# Patient Record
Sex: Male | Born: 1940
Health system: Southern US, Community
[De-identification: ages and names within clinical notes are randomized; demographics above are authoritative.]

## PROBLEM LIST (undated history)

## (undated) DIAGNOSIS — F32A Depression, unspecified: Secondary | ICD-10-CM

## (undated) DIAGNOSIS — M792 Neuralgia and neuritis, unspecified: Secondary | ICD-10-CM

## (undated) DIAGNOSIS — F329 Major depressive disorder, single episode, unspecified: Secondary | ICD-10-CM

## (undated) DIAGNOSIS — I2510 Atherosclerotic heart disease of native coronary artery without angina pectoris: Secondary | ICD-10-CM

## (undated) DIAGNOSIS — E7800 Pure hypercholesterolemia, unspecified: Secondary | ICD-10-CM

## (undated) DIAGNOSIS — D45 Polycythemia vera: Principal | ICD-10-CM

## (undated) DIAGNOSIS — B029 Zoster without complications: Secondary | ICD-10-CM

## (undated) DIAGNOSIS — I10 Essential (primary) hypertension: Secondary | ICD-10-CM

## (undated) HISTORY — DX: Polycythemia vera: D45

## (undated) HISTORY — DX: Zoster without complications: B02.9

## (undated) HISTORY — PX: CARDIAC CATHETERIZATION: SHX172

## (undated) HISTORY — DX: Neuralgia and neuritis, unspecified: M79.2

---

## 2002-02-09 ENCOUNTER — Ambulatory Visit (HOSPITAL_COMMUNITY): Admission: RE | Admit: 2002-02-09 | Discharge: 2002-02-09 | Payer: Self-pay | Admitting: Gastroenterology

## 2004-09-06 ENCOUNTER — Ambulatory Visit: Payer: Self-pay | Admitting: Hematology & Oncology

## 2004-10-31 ENCOUNTER — Ambulatory Visit: Payer: Self-pay | Admitting: Hematology & Oncology

## 2004-12-26 ENCOUNTER — Ambulatory Visit: Payer: Self-pay | Admitting: Hematology & Oncology

## 2005-02-20 ENCOUNTER — Ambulatory Visit: Payer: Self-pay | Admitting: Hematology & Oncology

## 2005-04-17 ENCOUNTER — Ambulatory Visit: Payer: Self-pay | Admitting: Hematology & Oncology

## 2005-08-07 ENCOUNTER — Ambulatory Visit: Payer: Self-pay | Admitting: Hematology & Oncology

## 2005-10-17 ENCOUNTER — Ambulatory Visit: Payer: Self-pay | Admitting: Hematology & Oncology

## 2006-02-04 ENCOUNTER — Ambulatory Visit: Payer: Self-pay | Admitting: Hematology & Oncology

## 2006-02-06 LAB — CBC WITH DIFFERENTIAL/PLATELET
BASO%: 0.3 % (ref 0.0–2.0)
Basophils Absolute: 0 10*3/uL (ref 0.0–0.1)
EOS%: 0.8 % (ref 0.0–7.0)
Eosinophils Absolute: 0.1 10*3/uL (ref 0.0–0.5)
HCT: 49.8 % (ref 38.7–49.9)
HGB: 17.3 g/dL — ABNORMAL HIGH (ref 13.0–17.1)
LYMPH%: 26.6 % (ref 14.0–48.0)
MCH: 31.6 pg (ref 28.0–33.4)
MCHC: 34.8 g/dL (ref 32.0–35.9)
MCV: 90.8 fL (ref 81.6–98.0)
MONO#: 1.1 10*3/uL — ABNORMAL HIGH (ref 0.1–0.9)
MONO%: 13.2 % — ABNORMAL HIGH (ref 0.0–13.0)
NEUT#: 4.7 10*3/uL (ref 1.5–6.5)
NEUT%: 59.1 % (ref 40.0–75.0)
Platelets: 245 10*3/uL (ref 145–400)
RBC: 5.48 10*6/uL (ref 4.20–5.71)
RDW: 14 % (ref 11.2–14.6)
WBC: 8 10*3/uL (ref 4.0–10.0)
lymph#: 2.1 10*3/uL (ref 0.9–3.3)

## 2006-04-07 ENCOUNTER — Ambulatory Visit: Payer: Self-pay | Admitting: Hematology & Oncology

## 2006-04-10 LAB — CBC WITH DIFFERENTIAL/PLATELET
BASO%: 0.4 % (ref 0.0–2.0)
Basophils Absolute: 0 10*3/uL (ref 0.0–0.1)
EOS%: 1.2 % (ref 0.0–7.0)
Eosinophils Absolute: 0.1 10*3/uL (ref 0.0–0.5)
HCT: 49.9 % (ref 38.7–49.9)
HGB: 17.5 g/dL — ABNORMAL HIGH (ref 13.0–17.1)
LYMPH%: 30.6 % (ref 14.0–48.0)
MCH: 32.3 pg (ref 28.0–33.4)
MCHC: 35.1 g/dL (ref 32.0–35.9)
MCV: 91.8 fL (ref 81.6–98.0)
MONO#: 0.7 10*3/uL (ref 0.1–0.9)
MONO%: 8.8 % (ref 0.0–13.0)
NEUT#: 4.8 10*3/uL (ref 1.5–6.5)
NEUT%: 59 % (ref 40.0–75.0)
Platelets: 239 10*3/uL (ref 145–400)
RBC: 5.43 10*6/uL (ref 4.20–5.71)
RDW: 13.8 % (ref 11.2–14.6)
WBC: 8.2 10*3/uL (ref 4.0–10.0)
lymph#: 2.5 10*3/uL (ref 0.9–3.3)

## 2006-04-14 LAB — CBC WITH DIFFERENTIAL/PLATELET
BASO%: 1.6 % (ref 0.0–2.0)
Basophils Absolute: 0.1 10*3/uL (ref 0.0–0.1)
EOS%: 1.1 % (ref 0.0–7.0)
Eosinophils Absolute: 0.1 10*3/uL (ref 0.0–0.5)
HCT: 53.6 % — ABNORMAL HIGH (ref 38.7–49.9)
HGB: 18.5 g/dL — ABNORMAL HIGH (ref 13.0–17.1)
LYMPH%: 22.8 % (ref 14.0–48.0)
MCH: 31.9 pg (ref 28.0–33.4)
MCHC: 34.6 g/dL (ref 32.0–35.9)
MCV: 92.1 fL (ref 81.6–98.0)
MONO#: 0.9 10*3/uL (ref 0.1–0.9)
MONO%: 11 % (ref 0.0–13.0)
NEUT#: 5.2 10*3/uL (ref 1.5–6.5)
NEUT%: 63.5 % (ref 40.0–75.0)
Platelets: 235 10*3/uL (ref 145–400)
RBC: 5.82 10*6/uL — ABNORMAL HIGH (ref 4.20–5.71)
RDW: 13.6 % (ref 11.2–14.6)
WBC: 8.2 10*3/uL (ref 4.0–10.0)
lymph#: 1.9 10*3/uL (ref 0.9–3.3)

## 2006-04-21 LAB — CBC WITH DIFFERENTIAL/PLATELET
BASO%: 0.5 % (ref 0.0–2.0)
Basophils Absolute: 0 10*3/uL (ref 0.0–0.1)
EOS%: 0.8 % (ref 0.0–7.0)
Eosinophils Absolute: 0.1 10*3/uL (ref 0.0–0.5)
HCT: 51.5 % — ABNORMAL HIGH (ref 38.7–49.9)
HGB: 17.9 g/dL — ABNORMAL HIGH (ref 13.0–17.1)
LYMPH%: 20.5 % (ref 14.0–48.0)
MCH: 32.1 pg (ref 28.0–33.4)
MCHC: 34.7 g/dL (ref 32.0–35.9)
MCV: 92.7 fL (ref 81.6–98.0)
MONO#: 1.1 10*3/uL — ABNORMAL HIGH (ref 0.1–0.9)
MONO%: 11.2 % (ref 0.0–13.0)
NEUT#: 6.5 10*3/uL (ref 1.5–6.5)
NEUT%: 67 % (ref 40.0–75.0)
Platelets: 261 10*3/uL (ref 145–400)
RBC: 5.56 10*6/uL (ref 4.20–5.71)
RDW: 13.4 % (ref 11.2–14.6)
WBC: 9.7 10*3/uL (ref 4.0–10.0)
lymph#: 2 10*3/uL (ref 0.9–3.3)

## 2006-04-28 LAB — CBC WITH DIFFERENTIAL/PLATELET
BASO%: 0.5 % (ref 0.0–2.0)
Basophils Absolute: 0 10*3/uL (ref 0.0–0.1)
EOS%: 0.8 % (ref 0.0–7.0)
Eosinophils Absolute: 0.1 10*3/uL (ref 0.0–0.5)
HCT: 47.5 % (ref 38.7–49.9)
HGB: 16.7 g/dL (ref 13.0–17.1)
LYMPH%: 25.1 % (ref 14.0–48.0)
MCH: 32.4 pg (ref 28.0–33.4)
MCHC: 35.2 g/dL (ref 32.0–35.9)
MCV: 91.9 fL (ref 81.6–98.0)
MONO#: 0.9 10*3/uL (ref 0.1–0.9)
MONO%: 10.9 % (ref 0.0–13.0)
NEUT#: 5 10*3/uL (ref 1.5–6.5)
NEUT%: 62.7 % (ref 40.0–75.0)
Platelets: 270 10*3/uL (ref 145–400)
RBC: 5.17 10*6/uL (ref 4.20–5.71)
RDW: 13.5 % (ref 11.2–14.6)
WBC: 7.9 10*3/uL (ref 4.0–10.0)
lymph#: 2 10*3/uL (ref 0.9–3.3)

## 2006-05-08 LAB — CBC WITH DIFFERENTIAL/PLATELET
BASO%: 0.7 % (ref 0.0–2.0)
Basophils Absolute: 0.1 10*3/uL (ref 0.0–0.1)
EOS%: 0.9 % (ref 0.0–7.0)
Eosinophils Absolute: 0.1 10*3/uL (ref 0.0–0.5)
HCT: 43.1 % (ref 38.7–49.9)
HGB: 14.8 g/dL (ref 13.0–17.1)
LYMPH%: 33.3 % (ref 14.0–48.0)
MCH: 31.3 pg (ref 28.0–33.4)
MCHC: 34.3 g/dL (ref 32.0–35.9)
MCV: 91.1 fL (ref 81.6–98.0)
MONO#: 0.9 10*3/uL (ref 0.1–0.9)
MONO%: 12.6 % (ref 0.0–13.0)
NEUT#: 3.9 10*3/uL (ref 1.5–6.5)
NEUT%: 52.5 % (ref 40.0–75.0)
Platelets: 297 10*3/uL (ref 145–400)
RBC: 4.74 10*6/uL (ref 4.20–5.71)
RDW: 13.1 % (ref 11.2–14.6)
WBC: 7.4 10*3/uL (ref 4.0–10.0)
lymph#: 2.5 10*3/uL (ref 0.9–3.3)

## 2006-05-27 ENCOUNTER — Ambulatory Visit: Payer: Self-pay | Admitting: Hematology & Oncology

## 2006-05-29 LAB — FERRITIN: Ferritin: 13 ng/mL — ABNORMAL LOW (ref 22–322)

## 2006-05-29 LAB — CBC WITH DIFFERENTIAL/PLATELET
BASO%: 0.3 % (ref 0.0–2.0)
Basophils Absolute: 0 10*3/uL (ref 0.0–0.1)
EOS%: 0.8 % (ref 0.0–7.0)
Eosinophils Absolute: 0.1 10*3/uL (ref 0.0–0.5)
HCT: 45.4 % (ref 38.7–49.9)
HGB: 15.5 g/dL (ref 13.0–17.1)
LYMPH%: 32.2 % (ref 14.0–48.0)
MCH: 30.2 pg (ref 28.0–33.4)
MCHC: 34.1 g/dL (ref 32.0–35.9)
MCV: 88.4 fL (ref 81.6–98.0)
MONO#: 1 10*3/uL — ABNORMAL HIGH (ref 0.1–0.9)
MONO%: 12.8 % (ref 0.0–13.0)
NEUT#: 4.2 10*3/uL (ref 1.5–6.5)
NEUT%: 53.9 % (ref 40.0–75.0)
Platelets: 329 10*3/uL (ref 145–400)
RBC: 5.14 10*6/uL (ref 4.20–5.71)
RDW: 13.9 % (ref 11.2–14.6)
WBC: 7.9 10*3/uL (ref 4.0–10.0)
lymph#: 2.5 10*3/uL (ref 0.9–3.3)

## 2006-06-10 LAB — CBC WITH DIFFERENTIAL/PLATELET
BASO%: 0.9 % (ref 0.0–2.0)
Basophils Absolute: 0.1 10*3/uL (ref 0.0–0.1)
EOS%: 0.9 % (ref 0.0–7.0)
Eosinophils Absolute: 0.1 10*3/uL (ref 0.0–0.5)
HCT: 45.9 % (ref 38.7–49.9)
HGB: 15.6 g/dL (ref 13.0–17.1)
LYMPH%: 29.3 % (ref 14.0–48.0)
MCH: 30.1 pg (ref 28.0–33.4)
MCHC: 34.1 g/dL (ref 32.0–35.9)
MCV: 88.4 fL (ref 81.6–98.0)
MONO#: 0.8 10*3/uL (ref 0.1–0.9)
MONO%: 10.5 % (ref 0.0–13.0)
NEUT#: 4.6 10*3/uL (ref 1.5–6.5)
NEUT%: 58.4 % (ref 40.0–75.0)
Platelets: 290 10*3/uL (ref 145–400)
RBC: 5.19 10*6/uL (ref 4.20–5.71)
RDW: 13.9 % (ref 11.2–14.6)
WBC: 7.9 10*3/uL (ref 4.0–10.0)
lymph#: 2.3 10*3/uL (ref 0.9–3.3)

## 2006-09-02 ENCOUNTER — Ambulatory Visit: Payer: Self-pay | Admitting: Hematology & Oncology

## 2006-11-03 ENCOUNTER — Ambulatory Visit: Payer: Self-pay | Admitting: Hematology & Oncology

## 2006-11-06 LAB — CBC WITH DIFFERENTIAL/PLATELET
BASO%: 0.5 % (ref 0.0–2.0)
Basophils Absolute: 0 10*3/uL (ref 0.0–0.1)
EOS%: 0.6 % (ref 0.0–7.0)
Eosinophils Absolute: 0 10*3/uL (ref 0.0–0.5)
HCT: 46.5 % (ref 38.7–49.9)
HGB: 16.5 g/dL (ref 13.0–17.1)
LYMPH%: 28.9 % (ref 14.0–48.0)
MCH: 29.2 pg (ref 28.0–33.4)
MCHC: 35.4 g/dL (ref 32.0–35.9)
MCV: 82.5 fL (ref 81.6–98.0)
MONO#: 1 10*3/uL — ABNORMAL HIGH (ref 0.1–0.9)
MONO%: 13.5 % — ABNORMAL HIGH (ref 0.0–13.0)
NEUT#: 4 10*3/uL (ref 1.5–6.5)
NEUT%: 56.5 % (ref 40.0–75.0)
Platelets: 269 10*3/uL (ref 145–400)
RBC: 5.63 10*6/uL (ref 4.20–5.71)
RDW: 17.8 % — ABNORMAL HIGH (ref 11.2–14.6)
WBC: 7.1 10*3/uL (ref 4.0–10.0)
lymph#: 2.1 10*3/uL (ref 0.9–3.3)

## 2006-12-04 LAB — CBC WITH DIFFERENTIAL/PLATELET
BASO%: 1.2 % (ref 0.0–2.0)
Basophils Absolute: 0.1 10*3/uL (ref 0.0–0.1)
EOS%: 1 % (ref 0.0–7.0)
Eosinophils Absolute: 0.1 10*3/uL (ref 0.0–0.5)
HCT: 49.9 % (ref 38.7–49.9)
HGB: 16.6 g/dL (ref 13.0–17.1)
LYMPH%: 33.1 % (ref 14.0–48.0)
MCH: 28.5 pg (ref 28.0–33.4)
MCHC: 33.2 g/dL (ref 32.0–35.9)
MCV: 85.8 fL (ref 81.6–98.0)
MONO#: 1.4 10*3/uL — ABNORMAL HIGH (ref 0.1–0.9)
MONO%: 16.7 % — ABNORMAL HIGH (ref 0.0–13.0)
NEUT#: 4 10*3/uL (ref 1.5–6.5)
NEUT%: 48 % (ref 40.0–75.0)
Platelets: 294 10*3/uL (ref 145–400)
RBC: 5.82 10*6/uL — ABNORMAL HIGH (ref 4.20–5.71)
RDW: 13.9 % (ref 11.2–14.6)
WBC: 8.3 10*3/uL (ref 4.0–10.0)
lymph#: 2.8 10*3/uL (ref 0.9–3.3)

## 2006-12-11 LAB — CBC WITH DIFFERENTIAL/PLATELET
BASO%: 0.6 % (ref 0.0–2.0)
Basophils Absolute: 0.1 10*3/uL (ref 0.0–0.1)
EOS%: 0.6 % (ref 0.0–7.0)
Eosinophils Absolute: 0.1 10*3/uL (ref 0.0–0.5)
HCT: 45.7 % (ref 38.7–49.9)
HGB: 15.3 g/dL (ref 13.0–17.1)
LYMPH%: 27.7 % (ref 14.0–48.0)
MCH: 28.4 pg (ref 28.0–33.4)
MCHC: 33.5 g/dL (ref 32.0–35.9)
MCV: 84.8 fL (ref 81.6–98.0)
MONO#: 1.4 10*3/uL — ABNORMAL HIGH (ref 0.1–0.9)
MONO%: 14.3 % — ABNORMAL HIGH (ref 0.0–13.0)
NEUT#: 5.5 10*3/uL (ref 1.5–6.5)
NEUT%: 56.8 % (ref 40.0–75.0)
Platelets: 317 10*3/uL (ref 145–400)
RBC: 5.39 10*6/uL (ref 4.20–5.71)
RDW: 13.4 % (ref 11.2–14.6)
WBC: 9.6 10*3/uL (ref 4.0–10.0)
lymph#: 2.7 10*3/uL (ref 0.9–3.3)

## 2006-12-18 ENCOUNTER — Ambulatory Visit: Payer: Self-pay | Admitting: Hematology & Oncology

## 2006-12-18 LAB — CBC WITH DIFFERENTIAL/PLATELET
BASO%: 0.8 % (ref 0.0–2.0)
Basophils Absolute: 0.1 10*3/uL (ref 0.0–0.1)
EOS%: 0.5 % (ref 0.0–7.0)
Eosinophils Absolute: 0 10*3/uL (ref 0.0–0.5)
HCT: 42.3 % (ref 38.7–49.9)
HGB: 14.1 g/dL (ref 13.0–17.1)
LYMPH%: 25 % (ref 14.0–48.0)
MCH: 28.2 pg (ref 28.0–33.4)
MCHC: 33.3 g/dL (ref 32.0–35.9)
MCV: 84.7 fL (ref 81.6–98.0)
MONO#: 1.1 10*3/uL — ABNORMAL HIGH (ref 0.1–0.9)
MONO%: 13.4 % — ABNORMAL HIGH (ref 0.0–13.0)
NEUT#: 4.8 10*3/uL (ref 1.5–6.5)
NEUT%: 60.2 % (ref 40.0–75.0)
Platelets: 376 10*3/uL (ref 145–400)
RBC: 4.99 10*6/uL (ref 4.20–5.71)
RDW: 12.9 % (ref 11.2–14.6)
WBC: 7.9 10*3/uL (ref 4.0–10.0)
lymph#: 2 10*3/uL (ref 0.9–3.3)

## 2007-03-03 ENCOUNTER — Ambulatory Visit: Payer: Self-pay | Admitting: Hematology & Oncology

## 2007-03-05 LAB — CBC WITH DIFFERENTIAL/PLATELET
BASO%: 0.8 % (ref 0.0–2.0)
Basophils Absolute: 0.1 10*3/uL (ref 0.0–0.1)
EOS%: 1 % (ref 0.0–7.0)
Eosinophils Absolute: 0.1 10*3/uL (ref 0.0–0.5)
HCT: 46 % (ref 38.7–49.9)
HGB: 14.7 g/dL (ref 13.0–17.1)
LYMPH%: 32.7 % (ref 14.0–48.0)
MCH: 25.7 pg — ABNORMAL LOW (ref 28.0–33.4)
MCHC: 32 g/dL (ref 32.0–35.9)
MCV: 80.3 fL — ABNORMAL LOW (ref 81.6–98.0)
MONO#: 1.2 10*3/uL — ABNORMAL HIGH (ref 0.1–0.9)
MONO%: 14.1 % — ABNORMAL HIGH (ref 0.0–13.0)
NEUT#: 4.5 10*3/uL (ref 1.5–6.5)
NEUT%: 51.4 % (ref 40.0–75.0)
Platelets: 261 10*3/uL (ref 145–400)
RBC: 5.72 10*6/uL — ABNORMAL HIGH (ref 4.20–5.71)
RDW: 14.1 % (ref 11.2–14.6)
WBC: 8.7 10*3/uL (ref 4.0–10.0)
lymph#: 2.8 10*3/uL (ref 0.9–3.3)

## 2007-03-11 LAB — CBC WITH DIFFERENTIAL/PLATELET
BASO%: 0.9 % (ref 0.0–2.0)
Basophils Absolute: 0.1 10*3/uL (ref 0.0–0.1)
EOS%: 1 % (ref 0.0–7.0)
Eosinophils Absolute: 0.1 10*3/uL (ref 0.0–0.5)
HCT: 47.2 % (ref 38.7–49.9)
HGB: 15.1 g/dL (ref 13.0–17.1)
LYMPH%: 32.7 % (ref 14.0–48.0)
MCH: 25.6 pg — ABNORMAL LOW (ref 28.0–33.4)
MCHC: 31.9 g/dL — ABNORMAL LOW (ref 32.0–35.9)
MCV: 80.2 fL — ABNORMAL LOW (ref 81.6–98.0)
MONO#: 0.9 10*3/uL (ref 0.1–0.9)
MONO%: 12.1 % (ref 0.0–13.0)
NEUT#: 4.1 10*3/uL (ref 1.5–6.5)
NEUT%: 53.4 % (ref 40.0–75.0)
Platelets: 300 10*3/uL (ref 145–400)
RBC: 5.89 10*6/uL — ABNORMAL HIGH (ref 4.20–5.71)
RDW: 14.4 % (ref 11.2–14.6)
WBC: 7.8 10*3/uL (ref 4.0–10.0)
lymph#: 2.5 10*3/uL (ref 0.9–3.3)

## 2007-03-18 LAB — CBC WITH DIFFERENTIAL/PLATELET
BASO%: 0.7 % (ref 0.0–2.0)
Basophils Absolute: 0.1 10*3/uL (ref 0.0–0.1)
EOS%: 1.1 % (ref 0.0–7.0)
Eosinophils Absolute: 0.1 10*3/uL (ref 0.0–0.5)
HCT: 42.6 % (ref 38.7–49.9)
HGB: 14.2 g/dL (ref 13.0–17.1)
LYMPH%: 32.9 % (ref 14.0–48.0)
MCH: 26.3 pg — ABNORMAL LOW (ref 28.0–33.4)
MCHC: 33.3 g/dL (ref 32.0–35.9)
MCV: 79.1 fL — ABNORMAL LOW (ref 81.6–98.0)
MONO#: 1.2 10*3/uL — ABNORMAL HIGH (ref 0.1–0.9)
MONO%: 14.9 % — ABNORMAL HIGH (ref 0.0–13.0)
NEUT#: 4.1 10*3/uL (ref 1.5–6.5)
NEUT%: 50.3 % (ref 40.0–75.0)
Platelets: 308 10*3/uL (ref 145–400)
RBC: 5.38 10*6/uL (ref 4.20–5.71)
RDW: 14.5 % (ref 11.2–14.6)
WBC: 8.2 10*3/uL (ref 4.0–10.0)
lymph#: 2.7 10*3/uL (ref 0.9–3.3)

## 2007-03-25 LAB — CBC WITH DIFFERENTIAL/PLATELET
BASO%: 0.8 % (ref 0.0–2.0)
Basophils Absolute: 0.1 10*3/uL (ref 0.0–0.1)
EOS%: 1.1 % (ref 0.0–7.0)
Eosinophils Absolute: 0.1 10*3/uL (ref 0.0–0.5)
HCT: 42.5 % (ref 38.7–49.9)
HGB: 13.8 g/dL (ref 13.0–17.1)
LYMPH%: 35.6 % (ref 14.0–48.0)
MCH: 25.7 pg — ABNORMAL LOW (ref 28.0–33.4)
MCHC: 32.6 g/dL (ref 32.0–35.9)
MCV: 79 fL — ABNORMAL LOW (ref 81.6–98.0)
MONO#: 1 10*3/uL — ABNORMAL HIGH (ref 0.1–0.9)
MONO%: 13.8 % — ABNORMAL HIGH (ref 0.0–13.0)
NEUT#: 3.6 10*3/uL (ref 1.5–6.5)
NEUT%: 48.7 % (ref 40.0–75.0)
Platelets: 306 10*3/uL (ref 145–400)
RBC: 5.37 10*6/uL (ref 4.20–5.71)
RDW: 14.7 % — ABNORMAL HIGH (ref 11.2–14.6)
WBC: 7.5 10*3/uL (ref 4.0–10.0)
lymph#: 2.7 10*3/uL (ref 0.9–3.3)

## 2007-04-02 LAB — CBC WITH DIFFERENTIAL/PLATELET
BASO%: 0.3 % (ref 0.0–2.0)
Basophils Absolute: 0 10*3/uL (ref 0.0–0.1)
EOS%: 0.9 % (ref 0.0–7.0)
Eosinophils Absolute: 0.1 10*3/uL (ref 0.0–0.5)
HCT: 40.7 % (ref 38.7–49.9)
HGB: 13.6 g/dL (ref 13.0–17.1)
LYMPH%: 29.9 % (ref 14.0–48.0)
MCH: 26.2 pg — ABNORMAL LOW (ref 28.0–33.4)
MCHC: 33.5 g/dL (ref 32.0–35.9)
MCV: 78.3 fL — ABNORMAL LOW (ref 81.6–98.0)
MONO#: 0.7 10*3/uL (ref 0.1–0.9)
MONO%: 10.4 % (ref 0.0–13.0)
NEUT#: 4.1 10*3/uL (ref 1.5–6.5)
NEUT%: 58.5 % (ref 40.0–75.0)
Platelets: 283 10*3/uL (ref 145–400)
RBC: 5.2 10*6/uL (ref 4.20–5.71)
RDW: 17.2 % — ABNORMAL HIGH (ref 11.2–14.6)
WBC: 7 10*3/uL (ref 4.0–10.0)
lymph#: 2.1 10*3/uL (ref 0.9–3.3)

## 2007-04-02 LAB — FERRITIN: Ferritin: 11 ng/mL — ABNORMAL LOW (ref 22–322)

## 2007-05-05 ENCOUNTER — Ambulatory Visit: Payer: Self-pay | Admitting: Hematology & Oncology

## 2007-05-07 LAB — CBC WITH DIFFERENTIAL/PLATELET
BASO%: 1.2 % (ref 0.0–2.0)
Basophils Absolute: 0.1 10*3/uL (ref 0.0–0.1)
EOS%: 1.1 % (ref 0.0–7.0)
Eosinophils Absolute: 0.1 10*3/uL (ref 0.0–0.5)
HCT: 46 % (ref 38.7–49.9)
HGB: 14.5 g/dL (ref 13.0–17.1)
LYMPH%: 34.2 % (ref 14.0–48.0)
MCH: 25.3 pg — ABNORMAL LOW (ref 28.0–33.4)
MCHC: 31.6 g/dL — ABNORMAL LOW (ref 32.0–35.9)
MCV: 80.1 fL — ABNORMAL LOW (ref 81.6–98.0)
MONO#: 1.1 10*3/uL — ABNORMAL HIGH (ref 0.1–0.9)
MONO%: 14.2 % — ABNORMAL HIGH (ref 0.0–13.0)
NEUT#: 3.9 10*3/uL (ref 1.5–6.5)
NEUT%: 49.3 % (ref 40.0–75.0)
Platelets: 303 10*3/uL (ref 145–400)
RBC: 5.74 10*6/uL — ABNORMAL HIGH (ref 4.20–5.71)
RDW: 14.6 % (ref 11.2–14.6)
WBC: 7.8 10*3/uL (ref 4.0–10.0)
lymph#: 2.7 10*3/uL (ref 0.9–3.3)

## 2007-06-30 ENCOUNTER — Ambulatory Visit: Payer: Self-pay | Admitting: Hematology & Oncology

## 2007-07-02 LAB — CBC WITH DIFFERENTIAL/PLATELET
BASO%: 0.4 % (ref 0.0–2.0)
Basophils Absolute: 0 10*3/uL (ref 0.0–0.1)
EOS%: 0.7 % (ref 0.0–7.0)
Eosinophils Absolute: 0.1 10*3/uL (ref 0.0–0.5)
HCT: 44.5 % (ref 38.7–49.9)
HGB: 14.8 g/dL (ref 13.0–17.1)
LYMPH%: 27 % (ref 14.0–48.0)
MCH: 26.7 pg — ABNORMAL LOW (ref 28.0–33.4)
MCHC: 33.3 g/dL (ref 32.0–35.9)
MCV: 80.2 fL — ABNORMAL LOW (ref 81.6–98.0)
MONO#: 1.1 10*3/uL — ABNORMAL HIGH (ref 0.1–0.9)
MONO%: 13.1 % — ABNORMAL HIGH (ref 0.0–13.0)
NEUT#: 4.9 10*3/uL (ref 1.5–6.5)
NEUT%: 58.8 % (ref 40.0–75.0)
Platelets: 271 10*3/uL (ref 145–400)
RBC: 5.55 10*6/uL (ref 4.20–5.71)
RDW: 17.7 % — ABNORMAL HIGH (ref 11.2–14.6)
WBC: 8.4 10*3/uL (ref 4.0–10.0)
lymph#: 2.3 10*3/uL (ref 0.9–3.3)

## 2007-09-22 ENCOUNTER — Ambulatory Visit: Payer: Self-pay | Admitting: Hematology & Oncology

## 2007-09-25 LAB — IRON AND TIBC
%SAT: 22 % (ref 20–55)
Iron: 86 ug/dL (ref 42–165)
TIBC: 394 ug/dL (ref 215–435)
UIBC: 308 ug/dL

## 2007-09-25 LAB — CBC WITH DIFFERENTIAL/PLATELET
BASO%: 1.1 % (ref 0.0–2.0)
Basophils Absolute: 0.1 10*3/uL (ref 0.0–0.1)
EOS%: 1.2 % (ref 0.0–7.0)
Eosinophils Absolute: 0.1 10*3/uL (ref 0.0–0.5)
HCT: 48.7 % (ref 38.7–49.9)
HGB: 16.5 g/dL (ref 13.0–17.1)
LYMPH%: 31.5 % (ref 14.0–48.0)
MCH: 28.8 pg (ref 28.0–33.4)
MCHC: 34 g/dL (ref 32.0–35.9)
MCV: 84.8 fL (ref 81.6–98.0)
MONO#: 0.9 10*3/uL (ref 0.1–0.9)
MONO%: 13.3 % — ABNORMAL HIGH (ref 0.0–13.0)
NEUT#: 3.7 10*3/uL (ref 1.5–6.5)
NEUT%: 52.9 % (ref 40.0–75.0)
Platelets: 246 10*3/uL (ref 145–400)
RBC: 5.74 10*6/uL — ABNORMAL HIGH (ref 4.20–5.71)
RDW: 16.6 % — ABNORMAL HIGH (ref 11.2–14.6)
WBC: 7 10*3/uL (ref 4.0–10.0)
lymph#: 2.2 10*3/uL (ref 0.9–3.3)

## 2007-09-25 LAB — FERRITIN: Ferritin: 17 ng/mL — ABNORMAL LOW (ref 22–322)

## 2007-09-25 LAB — COMPREHENSIVE METABOLIC PANEL
ALT: 32 U/L (ref 0–53)
AST: 23 U/L (ref 0–37)
Albumin: 4.2 g/dL (ref 3.5–5.2)
Alkaline Phosphatase: 52 U/L (ref 39–117)
BUN: 15 mg/dL (ref 6–23)
CO2: 26 mEq/L (ref 19–32)
Calcium: 10.3 mg/dL (ref 8.4–10.5)
Chloride: 102 mEq/L (ref 96–112)
Creatinine, Ser: 1.02 mg/dL (ref 0.40–1.50)
Glucose, Bld: 107 mg/dL — ABNORMAL HIGH (ref 70–99)
Potassium: 3.5 mEq/L (ref 3.5–5.3)
Sodium: 138 mEq/L (ref 135–145)
Total Bilirubin: 1.4 mg/dL — ABNORMAL HIGH (ref 0.3–1.2)
Total Protein: 7.3 g/dL (ref 6.0–8.3)

## 2007-09-25 LAB — LACTATE DEHYDROGENASE: LDH: 115 U/L (ref 94–250)

## 2007-10-02 LAB — CBC WITH DIFFERENTIAL/PLATELET
BASO%: 1 % (ref 0.0–2.0)
Basophils Absolute: 0.1 10*3/uL (ref 0.0–0.1)
EOS%: 1.2 % (ref 0.0–7.0)
Eosinophils Absolute: 0.1 10*3/uL (ref 0.0–0.5)
HCT: 47.8 % (ref 38.7–49.9)
HGB: 15.9 g/dL (ref 13.0–17.1)
LYMPH%: 34 % (ref 14.0–48.0)
MCH: 28.9 pg (ref 28.0–33.4)
MCHC: 33.2 g/dL (ref 32.0–35.9)
MCV: 86.8 fL (ref 81.6–98.0)
MONO#: 1 10*3/uL — ABNORMAL HIGH (ref 0.1–0.9)
MONO%: 14.2 % — ABNORMAL HIGH (ref 0.0–13.0)
NEUT#: 3.7 10*3/uL (ref 1.5–6.5)
NEUT%: 49.6 % (ref 40.0–75.0)
Platelets: 290 10*3/uL (ref 145–400)
RBC: 5.5 10*6/uL (ref 4.20–5.71)
RDW: 13.6 % (ref 11.2–14.6)
WBC: 7.4 10*3/uL (ref 4.0–10.0)
lymph#: 2.5 10*3/uL (ref 0.9–3.3)

## 2007-11-10 ENCOUNTER — Ambulatory Visit: Payer: Self-pay | Admitting: Hematology & Oncology

## 2007-11-16 LAB — CBC WITH DIFFERENTIAL/PLATELET
BASO%: 1.1 % (ref 0.0–2.0)
Basophils Absolute: 0.1 10*3/uL (ref 0.0–0.1)
EOS%: 0.5 % (ref 0.0–7.0)
Eosinophils Absolute: 0 10*3/uL (ref 0.0–0.5)
HCT: 46.1 % (ref 38.7–49.9)
HGB: 15.5 g/dL (ref 13.0–17.1)
LYMPH%: 26.7 % (ref 14.0–48.0)
MCH: 29.4 pg (ref 28.0–33.4)
MCHC: 33.6 g/dL (ref 32.0–35.9)
MCV: 87.5 fL (ref 81.6–98.0)
MONO#: 1.1 10*3/uL — ABNORMAL HIGH (ref 0.1–0.9)
MONO%: 13.6 % — ABNORMAL HIGH (ref 0.0–13.0)
NEUT#: 4.7 10*3/uL (ref 1.5–6.5)
NEUT%: 58.1 % (ref 40.0–75.0)
Platelets: 348 10*3/uL (ref 145–400)
RBC: 5.27 10*6/uL (ref 4.20–5.71)
RDW: 14.2 % (ref 11.2–14.6)
WBC: 8.2 10*3/uL (ref 4.0–10.0)
lymph#: 2.2 10*3/uL (ref 0.9–3.3)

## 2007-12-23 ENCOUNTER — Ambulatory Visit: Payer: Self-pay | Admitting: Hematology & Oncology

## 2007-12-25 LAB — CBC WITH DIFFERENTIAL/PLATELET
BASO%: 0.3 % (ref 0.0–2.0)
Basophils Absolute: 0 10*3/uL (ref 0.0–0.1)
EOS%: 0.6 % (ref 0.0–7.0)
Eosinophils Absolute: 0 10*3/uL (ref 0.0–0.5)
HCT: 37.5 % — ABNORMAL LOW (ref 38.7–49.9)
HGB: 12.6 g/dL — ABNORMAL LOW (ref 13.0–17.1)
LYMPH%: 26.5 % (ref 14.0–48.0)
MCH: 26.7 pg — ABNORMAL LOW (ref 28.0–33.4)
MCHC: 33.5 g/dL (ref 32.0–35.9)
MCV: 79.7 fL — ABNORMAL LOW (ref 81.6–98.0)
MONO#: 1 10*3/uL — ABNORMAL HIGH (ref 0.1–0.9)
MONO%: 12.3 % (ref 0.0–13.0)
NEUT#: 4.8 10*3/uL (ref 1.5–6.5)
NEUT%: 60.3 % (ref 40.0–75.0)
Platelets: 353 10*3/uL (ref 145–400)
RBC: 4.7 10*6/uL (ref 4.20–5.71)
RDW: 15.2 % — ABNORMAL HIGH (ref 11.2–14.6)
WBC: 8 10*3/uL (ref 4.0–10.0)
lymph#: 2.1 10*3/uL (ref 0.9–3.3)

## 2008-03-22 ENCOUNTER — Ambulatory Visit: Payer: Self-pay | Admitting: Hematology & Oncology

## 2008-03-24 LAB — CBC WITH DIFFERENTIAL/PLATELET
BASO%: 0.4 % (ref 0.0–2.0)
Basophils Absolute: 0 10*3/uL (ref 0.0–0.1)
EOS%: 0.6 % (ref 0.0–7.0)
Eosinophils Absolute: 0 10*3/uL (ref 0.0–0.5)
HCT: 44.1 % (ref 38.7–49.9)
HGB: 14.4 g/dL (ref 13.0–17.1)
LYMPH%: 24.7 % (ref 14.0–48.0)
MCH: 24.9 pg — ABNORMAL LOW (ref 28.0–33.4)
MCHC: 32.6 g/dL (ref 32.0–35.9)
MCV: 76.3 fL — ABNORMAL LOW (ref 81.6–98.0)
MONO#: 1.1 10*3/uL — ABNORMAL HIGH (ref 0.1–0.9)
MONO%: 13.7 % — ABNORMAL HIGH (ref 0.0–13.0)
NEUT#: 4.9 10*3/uL (ref 1.5–6.5)
NEUT%: 60.6 % (ref 40.0–75.0)
Platelets: 310 10*3/uL (ref 145–400)
RBC: 5.78 10*6/uL — ABNORMAL HIGH (ref 4.20–5.71)
RDW: 19.1 % — ABNORMAL HIGH (ref 11.2–14.6)
WBC: 8.1 10*3/uL (ref 4.0–10.0)
lymph#: 2 10*3/uL (ref 0.9–3.3)

## 2008-03-24 LAB — FERRITIN: Ferritin: 12 ng/mL — ABNORMAL LOW (ref 22–322)

## 2008-06-29 ENCOUNTER — Ambulatory Visit: Payer: Self-pay | Admitting: Hematology & Oncology

## 2008-07-05 LAB — CBC WITH DIFFERENTIAL (CANCER CENTER ONLY)
BASO#: 0.4 10*3/uL — ABNORMAL HIGH (ref 0.0–0.2)
BASO%: 3.9 % — ABNORMAL HIGH (ref 0.0–2.0)
EOS%: 1 % (ref 0.0–7.0)
Eosinophils Absolute: 0.1 10*3/uL (ref 0.0–0.5)
HCT: 50.3 % — ABNORMAL HIGH (ref 38.7–49.9)
HGB: 16.5 g/dL (ref 13.0–17.1)
LYMPH#: 2.7 10*3/uL (ref 0.9–3.3)
LYMPH%: 29.3 % (ref 14.0–48.0)
MCH: 27.1 pg — ABNORMAL LOW (ref 28.0–33.4)
MCHC: 32.7 g/dL (ref 32.0–35.9)
MCV: 83 fL (ref 82–98)
MONO#: 1.1 10*3/uL — ABNORMAL HIGH (ref 0.1–0.9)
MONO%: 11.9 % (ref 0.0–13.0)
NEUT#: 5 10*3/uL (ref 1.5–6.5)
NEUT%: 53.9 % (ref 40.0–80.0)
Platelets: 269 10*3/uL (ref 145–400)
RBC: 6.08 10*6/uL — ABNORMAL HIGH (ref 4.20–5.70)
RDW: 15.1 % — ABNORMAL HIGH (ref 10.5–14.6)
WBC: 9.2 10*3/uL (ref 4.0–10.0)

## 2008-07-05 LAB — TECHNOLOGIST REVIEW CHCC SATELLITE

## 2008-09-27 ENCOUNTER — Ambulatory Visit: Payer: Self-pay | Admitting: Hematology & Oncology

## 2008-09-28 LAB — CBC WITH DIFFERENTIAL (CANCER CENTER ONLY)
BASO#: 0.1 10*3/uL (ref 0.0–0.2)
BASO%: 0.9 % (ref 0.0–2.0)
EOS%: 1 % (ref 0.0–7.0)
Eosinophils Absolute: 0.1 10*3/uL (ref 0.0–0.5)
HCT: 45.8 % (ref 38.7–49.9)
HGB: 15 g/dL (ref 13.0–17.1)
LYMPH#: 2.4 10*3/uL (ref 0.9–3.3)
LYMPH%: 32.2 % (ref 14.0–48.0)
MCH: 27.2 pg — ABNORMAL LOW (ref 28.0–33.4)
MCHC: 32.7 g/dL (ref 32.0–35.9)
MCV: 83 fL (ref 82–98)
MONO#: 0.7 10*3/uL (ref 0.1–0.9)
MONO%: 9.2 % (ref 0.0–13.0)
NEUT#: 4.2 10*3/uL (ref 1.5–6.5)
NEUT%: 56.7 % (ref 40.0–80.0)
Platelets: 275 10*3/uL (ref 145–400)
RBC: 5.5 10*6/uL (ref 4.20–5.70)
RDW: 13.8 % (ref 10.5–14.6)
WBC: 7.3 10*3/uL (ref 4.0–10.0)

## 2008-09-28 LAB — CHCC SATELLITE - SMEAR

## 2008-09-28 LAB — FERRITIN: Ferritin: 12 ng/mL — ABNORMAL LOW (ref 22–322)

## 2009-01-03 ENCOUNTER — Ambulatory Visit: Payer: Self-pay | Admitting: Hematology & Oncology

## 2009-01-04 LAB — CBC WITH DIFFERENTIAL (CANCER CENTER ONLY)
BASO#: 0 10*3/uL (ref 0.0–0.2)
BASO%: 0.3 % (ref 0.0–2.0)
EOS%: 1.2 % (ref 0.0–7.0)
Eosinophils Absolute: 0.1 10*3/uL (ref 0.0–0.5)
HCT: 47.8 % (ref 38.7–49.9)
HGB: 16 g/dL (ref 13.0–17.1)
LYMPH#: 2.3 10*3/uL (ref 0.9–3.3)
LYMPH%: 31.9 % (ref 14.0–48.0)
MCH: 28.6 pg (ref 28.0–33.4)
MCHC: 33.5 g/dL (ref 32.0–35.9)
MCV: 85 fL (ref 82–98)
MONO#: 0.9 10*3/uL (ref 0.1–0.9)
MONO%: 12.8 % (ref 0.0–13.0)
NEUT#: 3.9 10*3/uL (ref 1.5–6.5)
NEUT%: 53.8 % (ref 40.0–80.0)
Platelets: 236 10*3/uL (ref 145–400)
RBC: 5.6 10*6/uL (ref 4.20–5.70)
RDW: 17 % — ABNORMAL HIGH (ref 10.5–14.6)
WBC: 7.2 10*3/uL (ref 4.0–10.0)

## 2009-01-04 LAB — FERRITIN: Ferritin: 16 ng/mL — ABNORMAL LOW (ref 22–322)

## 2009-03-31 ENCOUNTER — Ambulatory Visit: Payer: Self-pay | Admitting: Hematology & Oncology

## 2009-04-03 LAB — CBC WITH DIFFERENTIAL (CANCER CENTER ONLY)
BASO#: 0 10*3/uL (ref 0.0–0.2)
BASO%: 0.6 % (ref 0.0–2.0)
EOS%: 0.9 % (ref 0.0–7.0)
Eosinophils Absolute: 0.1 10*3/uL (ref 0.0–0.5)
HCT: 45.2 % (ref 38.7–49.9)
HGB: 14.1 g/dL (ref 13.0–17.1)
LYMPH#: 2 10*3/uL (ref 0.9–3.3)
LYMPH%: 27.3 % (ref 14.0–48.0)
MCH: 25.2 pg — ABNORMAL LOW (ref 28.0–33.4)
MCHC: 31.3 g/dL — ABNORMAL LOW (ref 32.0–35.9)
MCV: 81 fL — ABNORMAL LOW (ref 82–98)
MONO#: 0.7 10*3/uL (ref 0.1–0.9)
MONO%: 9.8 % (ref 0.0–13.0)
NEUT#: 4.5 10*3/uL (ref 1.5–6.5)
NEUT%: 61.4 % (ref 40.0–80.0)
Platelets: 325 10*3/uL (ref 145–400)
RBC: 5.6 10*6/uL (ref 4.20–5.70)
RDW: 14.6 % (ref 10.5–14.6)
WBC: 7.3 10*3/uL (ref 4.0–10.0)

## 2009-04-03 LAB — CHCC SATELLITE - SMEAR

## 2009-04-04 LAB — FERRITIN: Ferritin: 10 ng/mL — ABNORMAL LOW (ref 22–322)

## 2009-05-12 ENCOUNTER — Ambulatory Visit: Payer: Self-pay | Admitting: Hematology & Oncology

## 2009-05-15 LAB — CBC WITH DIFFERENTIAL (CANCER CENTER ONLY)
BASO#: 0.1 10*3/uL (ref 0.0–0.2)
BASO%: 0.8 % (ref 0.0–2.0)
EOS%: 1.3 % (ref 0.0–7.0)
Eosinophils Absolute: 0.1 10*3/uL (ref 0.0–0.5)
HCT: 45.2 % (ref 38.7–49.9)
HGB: 14.7 g/dL (ref 13.0–17.1)
LYMPH#: 2.3 10*3/uL (ref 0.9–3.3)
LYMPH%: 33.3 % (ref 14.0–48.0)
MCH: 25.8 pg — ABNORMAL LOW (ref 28.0–33.4)
MCHC: 32.6 g/dL (ref 32.0–35.9)
MCV: 79 fL — ABNORMAL LOW (ref 82–98)
MONO#: 0.7 10*3/uL (ref 0.1–0.9)
MONO%: 9.6 % (ref 0.0–13.0)
NEUT#: 3.8 10*3/uL (ref 1.5–6.5)
NEUT%: 55 % (ref 40.0–80.0)
Platelets: 293 10*3/uL (ref 145–400)
RBC: 5.7 10*6/uL (ref 4.20–5.70)
RDW: 16.7 % — ABNORMAL HIGH (ref 10.5–14.6)
WBC: 6.9 10*3/uL (ref 4.0–10.0)

## 2009-05-15 LAB — FERRITIN: Ferritin: 10 ng/mL — ABNORMAL LOW (ref 22–322)

## 2009-05-15 LAB — CHCC SATELLITE - SMEAR

## 2009-05-15 LAB — TECHNOLOGIST REVIEW CHCC SATELLITE

## 2009-06-30 ENCOUNTER — Ambulatory Visit: Payer: Self-pay | Admitting: Hematology & Oncology

## 2009-07-03 LAB — CBC WITH DIFFERENTIAL (CANCER CENTER ONLY)
BASO#: 0.1 10*3/uL (ref 0.0–0.2)
BASO%: 1 % (ref 0.0–2.0)
EOS%: 1.1 % (ref 0.0–7.0)
Eosinophils Absolute: 0.1 10*3/uL (ref 0.0–0.5)
HCT: 47.6 % (ref 38.7–49.9)
HGB: 15.6 g/dL (ref 13.0–17.1)
LYMPH#: 2.3 10*3/uL (ref 0.9–3.3)
LYMPH%: 32.5 % (ref 14.0–48.0)
MCH: 26.8 pg — ABNORMAL LOW (ref 28.0–33.4)
MCHC: 32.9 g/dL (ref 32.0–35.9)
MCV: 82 fL (ref 82–98)
MONO#: 0.7 10*3/uL (ref 0.1–0.9)
MONO%: 9.4 % (ref 0.0–13.0)
NEUT#: 3.9 10*3/uL (ref 1.5–6.5)
NEUT%: 56 % (ref 40.0–80.0)
Platelets: 266 10*3/uL (ref 145–400)
RBC: 5.83 10*6/uL — ABNORMAL HIGH (ref 4.20–5.70)
RDW: 16.7 % — ABNORMAL HIGH (ref 10.5–14.6)
WBC: 7 10*3/uL (ref 4.0–10.0)

## 2009-07-03 LAB — FERRITIN: Ferritin: 10 ng/mL — ABNORMAL LOW (ref 22–322)

## 2009-07-10 LAB — CBC WITH DIFFERENTIAL (CANCER CENTER ONLY)
BASO#: 0 10*3/uL (ref 0.0–0.2)
BASO%: 0.7 % (ref 0.0–2.0)
EOS%: 1 % (ref 0.0–7.0)
Eosinophils Absolute: 0.1 10*3/uL (ref 0.0–0.5)
HCT: 41.9 % (ref 38.7–49.9)
HGB: 13.7 g/dL (ref 13.0–17.1)
LYMPH#: 2 10*3/uL (ref 0.9–3.3)
LYMPH%: 31 % (ref 14.0–48.0)
MCH: 26.8 pg — ABNORMAL LOW (ref 28.0–33.4)
MCHC: 32.6 g/dL (ref 32.0–35.9)
MCV: 82 fL (ref 82–98)
MONO#: 0.5 10*3/uL (ref 0.1–0.9)
MONO%: 8.4 % (ref 0.0–13.0)
NEUT#: 3.7 10*3/uL (ref 1.5–6.5)
NEUT%: 58.9 % (ref 40.0–80.0)
Platelets: 293 10*3/uL (ref 145–400)
RBC: 5.1 10*6/uL (ref 4.20–5.70)
RDW: 16.8 % — ABNORMAL HIGH (ref 10.5–14.6)
WBC: 6.4 10*3/uL (ref 4.0–10.0)

## 2009-08-10 ENCOUNTER — Ambulatory Visit: Payer: Self-pay | Admitting: Hematology & Oncology

## 2009-08-14 LAB — CBC WITH DIFFERENTIAL (CANCER CENTER ONLY)
BASO#: 0 10*3/uL (ref 0.0–0.2)
BASO%: 0.3 % (ref 0.0–2.0)
EOS%: 1.1 % (ref 0.0–7.0)
Eosinophils Absolute: 0.1 10*3/uL (ref 0.0–0.5)
HCT: 43.5 % (ref 38.7–49.9)
HGB: 14.7 g/dL (ref 13.0–17.1)
LYMPH#: 1.1 10*3/uL (ref 0.9–3.3)
LYMPH%: 15.5 % (ref 14.0–48.0)
MCH: 27.2 pg — ABNORMAL LOW (ref 28.0–33.4)
MCHC: 33.7 g/dL (ref 32.0–35.9)
MCV: 81 fL — ABNORMAL LOW (ref 82–98)
MONO#: 0.2 10*3/uL (ref 0.1–0.9)
MONO%: 3.5 % (ref 0.0–13.0)
NEUT#: 5.4 10*3/uL (ref 1.5–6.5)
NEUT%: 79.6 % (ref 40.0–80.0)
Platelets: 259 10*3/uL (ref 145–400)
RBC: 5.4 10*6/uL (ref 4.20–5.70)
RDW: 15.4 % — ABNORMAL HIGH (ref 10.5–14.6)
WBC: 6.8 10*3/uL (ref 4.0–10.0)

## 2009-08-14 LAB — TECHNOLOGIST REVIEW CHCC SATELLITE

## 2009-08-19 LAB — FERRITIN: Ferritin: 11 ng/mL — ABNORMAL LOW (ref 22–322)

## 2009-08-19 LAB — JAK2 GENOTYPR: JAK2 GenotypR: NOT DETECTED

## 2009-09-29 ENCOUNTER — Ambulatory Visit: Payer: Self-pay | Admitting: Hematology & Oncology

## 2009-10-02 LAB — CBC WITH DIFFERENTIAL (CANCER CENTER ONLY)
BASO#: 0.1 10*3/uL (ref 0.0–0.2)
BASO%: 0.8 % (ref 0.0–2.0)
EOS%: 0.9 % (ref 0.0–7.0)
Eosinophils Absolute: 0.1 10*3/uL (ref 0.0–0.5)
HCT: 47.7 % (ref 38.7–49.9)
HGB: 15.3 g/dL (ref 13.0–17.1)
LYMPH#: 2.1 10*3/uL (ref 0.9–3.3)
LYMPH%: 20.1 % (ref 14.0–48.0)
MCH: 27.4 pg — ABNORMAL LOW (ref 28.0–33.4)
MCHC: 32 g/dL (ref 32.0–35.9)
MCV: 86 fL (ref 82–98)
MONO#: 0.9 10*3/uL (ref 0.1–0.9)
MONO%: 8.8 % (ref 0.0–13.0)
NEUT#: 7.3 10*3/uL — ABNORMAL HIGH (ref 1.5–6.5)
NEUT%: 69.4 % (ref 40.0–80.0)
Platelets: 300 10*3/uL (ref 145–400)
RBC: 5.57 10*6/uL (ref 4.20–5.70)
RDW: 14.4 % (ref 10.5–14.6)
WBC: 10.6 10*3/uL — ABNORMAL HIGH (ref 4.0–10.0)

## 2009-12-01 ENCOUNTER — Ambulatory Visit: Payer: Self-pay | Admitting: Hematology & Oncology

## 2009-12-06 LAB — CBC WITH DIFFERENTIAL (CANCER CENTER ONLY)
BASO#: 0.2 10*3/uL (ref 0.0–0.2)
BASO%: 2.3 % — ABNORMAL HIGH (ref 0.0–2.0)
EOS%: 1.4 % (ref 0.0–7.0)
Eosinophils Absolute: 0.1 10*3/uL (ref 0.0–0.5)
HCT: 51 % — ABNORMAL HIGH (ref 38.7–49.9)
HGB: 16.5 g/dL (ref 13.0–17.1)
LYMPH#: 2.7 10*3/uL (ref 0.9–3.3)
LYMPH%: 27.9 % (ref 14.0–48.0)
MCH: 28.4 pg (ref 28.0–33.4)
MCHC: 32.4 g/dL (ref 32.0–35.9)
MCV: 87 fL (ref 82–98)
MONO#: 0.9 10*3/uL (ref 0.1–0.9)
MONO%: 9.7 % (ref 0.0–13.0)
NEUT#: 5.6 10*3/uL (ref 1.5–6.5)
NEUT%: 58.7 % (ref 40.0–80.0)
Platelets: 300 10*3/uL (ref 145–400)
RBC: 5.83 10*6/uL — ABNORMAL HIGH (ref 4.20–5.70)
RDW: 13.9 % (ref 10.5–14.6)
WBC: 9.6 10*3/uL (ref 4.0–10.0)

## 2009-12-06 LAB — TECHNOLOGIST REVIEW CHCC SATELLITE

## 2009-12-12 LAB — FERRITIN: Ferritin: 15 ng/mL — ABNORMAL LOW (ref 22–322)

## 2009-12-12 LAB — JAK2 GENOTYPR: JAK2 GenotypR: NOT DETECTED

## 2009-12-13 LAB — CBC WITH DIFFERENTIAL (CANCER CENTER ONLY)
BASO#: 0.1 10*3/uL (ref 0.0–0.2)
BASO%: 1 % (ref 0.0–2.0)
EOS%: 1.3 % (ref 0.0–7.0)
Eosinophils Absolute: 0.1 10*3/uL (ref 0.0–0.5)
HCT: 47.8 % (ref 38.7–49.9)
HGB: 15.4 g/dL (ref 13.0–17.1)
LYMPH#: 2.6 10*3/uL (ref 0.9–3.3)
LYMPH%: 35.2 % (ref 14.0–48.0)
MCH: 28.4 pg (ref 28.0–33.4)
MCHC: 32.2 g/dL (ref 32.0–35.9)
MCV: 88 fL (ref 82–98)
MONO#: 0.7 10*3/uL (ref 0.1–0.9)
MONO%: 9.7 % (ref 0.0–13.0)
NEUT#: 4 10*3/uL (ref 1.5–6.5)
NEUT%: 52.8 % (ref 40.0–80.0)
Platelets: 268 10*3/uL (ref 145–400)
RBC: 5.43 10*6/uL (ref 4.20–5.70)
RDW: 13.4 % (ref 10.5–14.6)
WBC: 7.5 10*3/uL (ref 4.0–10.0)

## 2009-12-13 LAB — TECHNOLOGIST REVIEW CHCC SATELLITE

## 2010-03-05 ENCOUNTER — Ambulatory Visit: Payer: Self-pay | Admitting: Hematology & Oncology

## 2010-03-07 LAB — CBC WITH DIFFERENTIAL (CANCER CENTER ONLY)
BASO#: 0.1 10*3/uL (ref 0.0–0.2)
BASO%: 1.4 % (ref 0.0–2.0)
EOS%: 1.5 % (ref 0.0–7.0)
Eosinophils Absolute: 0.1 10*3/uL (ref 0.0–0.5)
HCT: 47.6 % (ref 38.7–49.9)
HGB: 15.6 g/dL (ref 13.0–17.1)
LYMPH#: 2.6 10*3/uL (ref 0.9–3.3)
LYMPH%: 35.5 % (ref 14.0–48.0)
MCH: 28.4 pg (ref 28.0–33.4)
MCHC: 32.7 g/dL (ref 32.0–35.9)
MCV: 87 fL (ref 82–98)
MONO#: 0.7 10*3/uL (ref 0.1–0.9)
MONO%: 8.8 % (ref 0.0–13.0)
NEUT#: 3.9 10*3/uL (ref 1.5–6.5)
NEUT%: 52.8 % (ref 40.0–80.0)
Platelets: 263 10*3/uL (ref 145–400)
RBC: 5.48 10*6/uL (ref 4.20–5.70)
RDW: 13.3 % (ref 10.5–14.6)
WBC: 7.4 10*3/uL (ref 4.0–10.0)

## 2010-03-07 LAB — CHCC SATELLITE - SMEAR

## 2010-05-29 ENCOUNTER — Ambulatory Visit: Payer: Self-pay | Admitting: Hematology & Oncology

## 2010-05-30 LAB — CBC WITH DIFFERENTIAL (CANCER CENTER ONLY)
BASO#: 0.1 10*3/uL (ref 0.0–0.2)
BASO%: 0.9 % (ref 0.0–2.0)
EOS%: 1.7 % (ref 0.0–7.0)
Eosinophils Absolute: 0.1 10*3/uL (ref 0.0–0.5)
HCT: 49.6 % (ref 38.7–49.9)
HGB: 15.9 g/dL (ref 13.0–17.1)
LYMPH#: 2.3 10*3/uL (ref 0.9–3.3)
LYMPH%: 36.3 % (ref 14.0–48.0)
MCH: 28.1 pg (ref 28.0–33.4)
MCHC: 32.2 g/dL (ref 32.0–35.9)
MCV: 88 fL (ref 82–98)
MONO#: 0.6 10*3/uL (ref 0.1–0.9)
MONO%: 8.9 % (ref 0.0–13.0)
NEUT#: 3.3 10*3/uL (ref 1.5–6.5)
NEUT%: 52.2 % (ref 40.0–80.0)
Platelets: 291 10*3/uL (ref 145–400)
RBC: 5.67 10*6/uL (ref 4.20–5.70)
RDW: 14 % (ref 10.5–14.6)
WBC: 6.4 10*3/uL (ref 4.0–10.0)

## 2010-05-30 LAB — CHCC SATELLITE - SMEAR

## 2010-05-30 LAB — FERRITIN: Ferritin: 16 ng/mL — ABNORMAL LOW (ref 22–322)

## 2010-06-07 LAB — CBC WITH DIFFERENTIAL (CANCER CENTER ONLY)
BASO#: 0.1 10*3/uL (ref 0.0–0.2)
BASO%: 0.9 % (ref 0.0–2.0)
EOS%: 1.2 % (ref 0.0–7.0)
Eosinophils Absolute: 0.1 10*3/uL (ref 0.0–0.5)
HCT: 44.6 % (ref 38.7–49.9)
HGB: 14.4 g/dL (ref 13.0–17.1)
LYMPH#: 2.1 10*3/uL (ref 0.9–3.3)
LYMPH%: 33.1 % (ref 14.0–48.0)
MCH: 28.3 pg (ref 28.0–33.4)
MCHC: 32.3 g/dL (ref 32.0–35.9)
MCV: 87 fL (ref 82–98)
MONO#: 0.9 10*3/uL (ref 0.1–0.9)
MONO%: 13.9 % — ABNORMAL HIGH (ref 0.0–13.0)
NEUT#: 3.2 10*3/uL (ref 1.5–6.5)
NEUT%: 50.9 % (ref 40.0–80.0)
Platelets: 319 10*3/uL (ref 145–400)
RBC: 5.1 10*6/uL (ref 4.20–5.70)
RDW: 14 % (ref 10.5–14.6)
WBC: 6.3 10*3/uL (ref 4.0–10.0)

## 2010-08-27 ENCOUNTER — Ambulatory Visit (HOSPITAL_BASED_OUTPATIENT_CLINIC_OR_DEPARTMENT_OTHER): Payer: MEDICARE | Admitting: Hematology & Oncology

## 2010-08-29 LAB — CBC WITH DIFFERENTIAL (CANCER CENTER ONLY)
BASO#: 0.1 10*3/uL (ref 0.0–0.2)
BASO%: 1.4 % (ref 0.0–2.0)
EOS%: 1.3 % (ref 0.0–7.0)
Eosinophils Absolute: 0.1 10*3/uL (ref 0.0–0.5)
HCT: 47.9 % (ref 38.7–49.9)
HGB: 15.6 g/dL (ref 13.0–17.1)
LYMPH#: 1.7 10*3/uL (ref 0.9–3.3)
LYMPH%: 27.4 % (ref 14.0–48.0)
MCH: 28.7 pg (ref 28.0–33.4)
MCHC: 32.6 g/dL (ref 32.0–35.9)
MCV: 88 fL (ref 82–98)
MONO#: 0.8 10*3/uL (ref 0.1–0.9)
MONO%: 12.5 % (ref 0.0–13.0)
NEUT#: 3.7 10*3/uL (ref 1.5–6.5)
NEUT%: 57.4 % (ref 40.0–80.0)
Platelets: 263 10*3/uL (ref 145–400)
RBC: 5.44 10*6/uL (ref 4.20–5.70)
RDW: 13.4 % (ref 10.5–14.6)
WBC: 6.4 10*3/uL (ref 4.0–10.0)

## 2010-08-29 LAB — FERRITIN: Ferritin: 18 ng/mL — ABNORMAL LOW (ref 22–322)

## 2010-11-07 ENCOUNTER — Encounter (HOSPITAL_BASED_OUTPATIENT_CLINIC_OR_DEPARTMENT_OTHER): Payer: MEDICARE | Admitting: Hematology & Oncology

## 2010-11-07 DIAGNOSIS — Z7982 Long term (current) use of aspirin: Secondary | ICD-10-CM

## 2010-11-07 DIAGNOSIS — D45 Polycythemia vera: Secondary | ICD-10-CM

## 2010-11-07 LAB — CBC WITH DIFFERENTIAL (CANCER CENTER ONLY)
BASO#: 0.1 10*3/uL (ref 0.0–0.2)
BASO%: 1.2 % (ref 0.0–2.0)
EOS%: 1.4 % (ref 0.0–7.0)
Eosinophils Absolute: 0.1 10*3/uL (ref 0.0–0.5)
HCT: 49.3 % (ref 38.7–49.9)
HGB: 16.2 g/dL (ref 13.0–17.1)
LYMPH#: 2.5 10*3/uL (ref 0.9–3.3)
LYMPH%: 31.4 % (ref 14.0–48.0)
MCH: 29 pg (ref 28.0–33.4)
MCHC: 32.7 g/dL (ref 32.0–35.9)
MCV: 89 fL (ref 82–98)
MONO#: 0.9 10*3/uL (ref 0.1–0.9)
MONO%: 11.2 % (ref 0.0–13.0)
NEUT#: 4.4 10*3/uL (ref 1.5–6.5)
NEUT%: 54.8 % (ref 40.0–80.0)
Platelets: 241 10*3/uL (ref 145–400)
RBC: 5.56 10*6/uL (ref 4.20–5.70)
RDW: 13.7 % (ref 10.5–14.6)
WBC: 8.1 10*3/uL (ref 4.0–10.0)

## 2010-11-07 LAB — CHCC SATELLITE - SMEAR

## 2010-11-07 LAB — FERRITIN: Ferritin: 16 ng/mL — ABNORMAL LOW (ref 22–322)

## 2010-11-14 ENCOUNTER — Encounter (HOSPITAL_BASED_OUTPATIENT_CLINIC_OR_DEPARTMENT_OTHER): Payer: MEDICARE | Admitting: Hematology & Oncology

## 2010-11-14 ENCOUNTER — Other Ambulatory Visit: Payer: Self-pay | Admitting: Hematology & Oncology

## 2010-11-14 DIAGNOSIS — D45 Polycythemia vera: Secondary | ICD-10-CM

## 2010-11-14 LAB — CBC WITH DIFFERENTIAL (CANCER CENTER ONLY)
BASO#: 0.1 10*3/uL (ref 0.0–0.2)
BASO%: 1 % (ref 0.0–2.0)
EOS%: 1.5 % (ref 0.0–7.0)
Eosinophils Absolute: 0.1 10*3/uL (ref 0.0–0.5)
HCT: 47.5 % (ref 38.7–49.9)
HGB: 15.5 g/dL (ref 13.0–17.1)
LYMPH#: 2.6 10*3/uL (ref 0.9–3.3)
LYMPH%: 32.6 % (ref 14.0–48.0)
MCH: 29.2 pg (ref 28.0–33.4)
MCHC: 32.6 g/dL (ref 32.0–35.9)
MCV: 90 fL (ref 82–98)
MONO#: 0.9 10*3/uL (ref 0.1–0.9)
MONO%: 11.6 % (ref 0.0–13.0)
NEUT#: 4.2 10*3/uL (ref 1.5–6.5)
NEUT%: 53.3 % (ref 40.0–80.0)
Platelets: 285 10*3/uL (ref 145–400)
RBC: 5.31 10*6/uL (ref 4.20–5.70)
RDW: 13.5 % (ref 10.5–14.6)
WBC: 7.8 10*3/uL (ref 4.0–10.0)

## 2011-01-02 ENCOUNTER — Other Ambulatory Visit: Payer: Self-pay | Admitting: Hematology & Oncology

## 2011-01-02 ENCOUNTER — Encounter (HOSPITAL_BASED_OUTPATIENT_CLINIC_OR_DEPARTMENT_OTHER): Payer: MEDICARE | Admitting: Hematology & Oncology

## 2011-01-02 DIAGNOSIS — D45 Polycythemia vera: Secondary | ICD-10-CM

## 2011-01-02 DIAGNOSIS — Z7982 Long term (current) use of aspirin: Secondary | ICD-10-CM

## 2011-01-02 LAB — CBC WITH DIFFERENTIAL (CANCER CENTER ONLY)
BASO#: 0 10*3/uL (ref 0.0–0.2)
BASO%: 0.3 % (ref 0.0–2.0)
EOS%: 0.9 % (ref 0.0–7.0)
Eosinophils Absolute: 0.1 10*3/uL (ref 0.0–0.5)
HCT: 45.7 % (ref 38.7–49.9)
HGB: 15.3 g/dL (ref 13.0–17.1)
LYMPH#: 2.7 10*3/uL (ref 0.9–3.3)
LYMPH%: 33.7 % (ref 14.0–48.0)
MCH: 28 pg (ref 28.0–33.4)
MCHC: 33.5 g/dL (ref 32.0–35.9)
MCV: 84 fL (ref 82–98)
MONO#: 1.3 10*3/uL — ABNORMAL HIGH (ref 0.1–0.9)
MONO%: 16 % — ABNORMAL HIGH (ref 0.0–13.0)
NEUT#: 3.9 10*3/uL (ref 1.5–6.5)
NEUT%: 49.1 % (ref 40.0–80.0)
Platelets: 276 10*3/uL (ref 145–400)
RBC: 5.47 10*6/uL (ref 4.20–5.70)
RDW: 14.7 % (ref 11.1–15.7)
WBC: 8 10*3/uL (ref 4.0–10.0)

## 2011-01-02 LAB — RETICULOCYTES (CHCC)
ABS Retic: 87.5 10*3/uL (ref 19.0–186.0)
RBC.: 5.47 MIL/uL (ref 4.22–5.81)
Retic Ct Pct: 1.6 % (ref 0.4–3.1)

## 2011-01-02 LAB — FERRITIN: Ferritin: 17 ng/mL — ABNORMAL LOW (ref 22–322)

## 2011-01-02 LAB — CHCC SATELLITE - SMEAR

## 2011-02-08 NOTE — Procedures (Signed)
[place]. [facility]  Patient:    Jesse, [name] Visit Number: [id number] MRN: [id number]          Service Type: END Location: ENDO Attending Physician:  Charna Elizabeth Dictated by:   Anselmo Rod, M.D. Proc. Date: 02/09/02 Admit Date:  02/09/2002   CC:         Kern Reap, M.D.   Procedure Report  DATE OF BIRTH:  29-Jan-1941  REFERRING PHYSICIAN:  Kern Reap, M.D.  PROCEDURE PERFORMED:  Screening colonoscopy.  ENDOSCOPIST:  Anselmo Rod, M.D.  INSTRUMENT USED:  Olympus adjustable pediatric video colonoscope.  INDICATIONS FOR PROCEDURE:  Guaiac positive stool in a 70 year old white male. Rule out colonic polyps, masses, hemorrhoids, etc.  PREPROCEDURE PREPARATION:  Informed consent was procured from the patient. The patient was fasted for eight hours prior to the procedure and prepped with a bottle of magnesium citrate and a gallon of NuLytely the night prior to the procedure.  PREPROCEDURE PHYSICAL:  The patient had stable vital signs.  Neck supple. Chest clear to auscultation.  S1, S2 regular.  Abdomen soft with normal bowel sounds.  DESCRIPTION OF PROCEDURE:  The patient was placed in the left lateral decubitus position and sedated with 60 mg of Demerol and 6 mg of Versed intravenously.  Once the patient was adequately sedated and maintained on low-flow oxygen and continuous cardiac monitoring, the Olympus video colonoscope was advanced from the rectum to the cecum without difficulty. The entire colonic mucosa appeared healthy without lesions.  There was some residual stool.  Multiple washes were done.  No masses polyps, erosions, ulcerations or diverticula were seen.  Small internal hemorrhoids were appreciated on retroflexion in the rectum and on anal inspection.  The patient tolerated the procedure well without complication.  IMPRESSION:  Normal colonoscopy except for small nonbleeding internal and external  hemorrhoids.  RECOMMENDATIONS: 1. Repeat guaiac testing will be done on an outpatient basis and further    recommendations made as needed. 2. Repeat colorectal cancer screening is recommended in the next five to    10 years unless the patient develops any abnormal symptoms in the interim. 3. Outpatient follow-up in the next two weeks.Dictated by:   Anselmo Rod, M.D. Attending Physician:  Charna Elizabeth DD:  02/09/02 TD:  02/10/02 Job: [id number]

## 2011-02-28 ENCOUNTER — Other Ambulatory Visit: Payer: Self-pay | Admitting: Hematology & Oncology

## 2011-02-28 ENCOUNTER — Encounter (HOSPITAL_BASED_OUTPATIENT_CLINIC_OR_DEPARTMENT_OTHER): Payer: Medicare Other | Admitting: Hematology & Oncology

## 2011-02-28 DIAGNOSIS — D45 Polycythemia vera: Secondary | ICD-10-CM

## 2011-02-28 DIAGNOSIS — Z7982 Long term (current) use of aspirin: Secondary | ICD-10-CM

## 2011-02-28 LAB — CBC WITH DIFFERENTIAL (CANCER CENTER ONLY)
BASO#: 0 10*3/uL (ref 0.0–0.2)
BASO%: 0.3 % (ref 0.0–2.0)
EOS%: 1.3 % (ref 0.0–7.0)
Eosinophils Absolute: 0.1 10*3/uL (ref 0.0–0.5)
HCT: 47 % (ref 38.7–49.9)
HGB: 16.1 g/dL (ref 13.0–17.1)
LYMPH#: 2.2 10*3/uL (ref 0.9–3.3)
LYMPH%: 35.1 % (ref 14.0–48.0)
MCH: 28.4 pg (ref 28.0–33.4)
MCHC: 34.3 g/dL (ref 32.0–35.9)
MCV: 83 fL (ref 82–98)
MONO#: 0.8 10*3/uL (ref 0.1–0.9)
MONO%: 13.4 % — ABNORMAL HIGH (ref 0.0–13.0)
NEUT#: 3.1 10*3/uL (ref 1.5–6.5)
NEUT%: 49.9 % (ref 40.0–80.0)
Platelets: 230 10*3/uL (ref 145–400)
RBC: 5.66 10*6/uL (ref 4.20–5.70)
RDW: 16.7 % — ABNORMAL HIGH (ref 11.1–15.7)
WBC: 6.3 10*3/uL (ref 4.0–10.0)

## 2011-02-28 LAB — FERRITIN: Ferritin: 20 ng/mL — ABNORMAL LOW (ref 22–322)

## 2011-06-10 ENCOUNTER — Other Ambulatory Visit: Payer: Self-pay | Admitting: Hematology & Oncology

## 2011-06-10 ENCOUNTER — Encounter (HOSPITAL_BASED_OUTPATIENT_CLINIC_OR_DEPARTMENT_OTHER): Payer: Medicare Other | Admitting: Hematology & Oncology

## 2011-06-10 DIAGNOSIS — D45 Polycythemia vera: Secondary | ICD-10-CM

## 2011-06-10 LAB — CBC WITH DIFFERENTIAL (CANCER CENTER ONLY)
BASO#: 0 10*3/uL (ref 0.0–0.2)
BASO%: 0.3 % (ref 0.0–2.0)
EOS%: 0.5 % (ref 0.0–7.0)
Eosinophils Absolute: 0 10*3/uL (ref 0.0–0.5)
HCT: 48.7 % (ref 38.7–49.9)
HGB: 17.1 g/dL (ref 13.0–17.1)
LYMPH#: 2.3 10*3/uL (ref 0.9–3.3)
LYMPH%: 32 % (ref 14.0–48.0)
MCH: 29.8 pg (ref 28.0–33.4)
MCHC: 35.1 g/dL (ref 32.0–35.9)
MCV: 85 fL (ref 82–98)
MONO#: 0.9 10*3/uL (ref 0.1–0.9)
MONO%: 12.6 % (ref 0.0–13.0)
NEUT#: 4 10*3/uL (ref 1.5–6.5)
NEUT%: 54.6 % (ref 40.0–80.0)
Platelets: 225 10*3/uL (ref 145–400)
RBC: 5.73 10*6/uL — ABNORMAL HIGH (ref 4.20–5.70)
RDW: 15.8 % — ABNORMAL HIGH (ref 11.1–15.7)
WBC: 7.3 10*3/uL (ref 4.0–10.0)

## 2011-06-10 LAB — CHCC SATELLITE - SMEAR

## 2011-06-10 LAB — FERRITIN: Ferritin: 18 ng/mL — ABNORMAL LOW (ref 22–322)

## 2011-08-13 ENCOUNTER — Encounter: Payer: Self-pay | Admitting: Emergency Medicine

## 2011-08-13 ENCOUNTER — Emergency Department (HOSPITAL_BASED_OUTPATIENT_CLINIC_OR_DEPARTMENT_OTHER)
Admission: EM | Admit: 2011-08-13 | Discharge: 2011-08-13 | Disposition: A | Discharge status: Home / Self Care | Payer: Medicare Other | Attending: Emergency Medicine | Admitting: Emergency Medicine

## 2011-08-13 DIAGNOSIS — B029 Zoster without complications: Secondary | ICD-10-CM

## 2011-08-13 DIAGNOSIS — R21 Rash and other nonspecific skin eruption: Secondary | ICD-10-CM

## 2011-08-13 DIAGNOSIS — I10 Essential (primary) hypertension: Secondary | ICD-10-CM | POA: Insufficient documentation

## 2011-08-13 DIAGNOSIS — E7800 Pure hypercholesterolemia, unspecified: Secondary | ICD-10-CM | POA: Insufficient documentation

## 2011-08-13 DIAGNOSIS — L299 Pruritus, unspecified: Secondary | ICD-10-CM | POA: Insufficient documentation

## 2011-08-13 DIAGNOSIS — Z79899 Other long term (current) drug therapy: Secondary | ICD-10-CM | POA: Insufficient documentation

## 2011-08-13 HISTORY — DX: Pure hypercholesterolemia, unspecified: E78.00

## 2011-08-13 HISTORY — DX: Essential (primary) hypertension: I10

## 2011-08-13 MED ORDER — VALACYCLOVIR HCL 1 G PO TABS: 1000.0000 mg | ORAL_TABLET | Freq: Three times a day (TID) | ORAL | Status: AC

## 2011-08-13 MED ORDER — DIPHENHYDRAMINE HCL 25 MG PO CAPS: 25.0000 mg | ORAL_CAPSULE | Freq: Four times a day (QID) | ORAL | Status: AC | PRN

## 2011-08-13 NOTE — ED Notes (Signed)
Pt has rash to left hand and arm.  Pt states it is tingling on arm and itching on hand.  Pt states he started taking Simvastatin around the time the rash started, and also changed liquid detergent.  No known fever.

## 2011-08-13 NOTE — ED Provider Notes (Signed)
History     CSN: [id number] Arrival date & time: 08/13/2011 12:31 PM   First MD Initiated Contact with Patient 08/13/11 1238      Chief Complaint  Patient presents with  . Rash  . Pruritis    (Consider location/radiation/quality/duration/timing/severity/associated sxs/prior treatment) Patient is a 70 y.o. male presenting with rash. The history is provided by the patient.  Rash  This is a new problem. The current episode started more than 1 week ago. The problem is associated with nothing. There has been no fever. The pain is at a severity of 5/10. The pain is mild. The pain has been intermittent since onset. Associated symptoms include itching and pain. Pertinent negatives include no blisters and no weeping. He has tried steriods for the symptoms. The treatment provided no relief. Risk factors include new medications.  Pt states he noticed rash to the left forearm and first two fingers about a month ago. Tried cortisone cream with no improvement. Stats rash is burning, painful at times, itchy. Denies fever, chills, malaise. States did start new medication, simvastatin right before rash started, also recalls changing his dish soap detergent. States tried to get in to see a dermatologist but unable to get an appointment till jan.   Past Medical History  Diagnosis Date  . Hypertension   . Hypercholesteremia     History reviewed. No pertinent past surgical history.  History reviewed. No pertinent family history.  History  Substance Use Topics  . Smoking status: Never Smoker   . Smokeless tobacco: Not on file  . Alcohol Use: No      Review of Systems  Constitutional: Negative for fever, chills and fatigue.  HENT: Negative for facial swelling and mouth sores.   Eyes: Negative.   Respiratory: Negative.   Cardiovascular: Negative.   Gastrointestinal: Negative.   Genitourinary: Negative.   Musculoskeletal: Negative.   Skin: Positive for itching and rash.  Neurological:  Negative for weakness and numbness.  Hematological: Negative.   Psychiatric/Behavioral: Negative.     Allergies  Review of patient's allergies indicates no known allergies.  Home Medications   Current Outpatient Rx  Name Route Sig Dispense Refill  . ALPRAZOLAM 0.5 MG PO TABS Oral Take 0.5 mg by mouth at bedtime as needed.      Marland Kitchen VITAMIN D 1000 UNITS PO TABS Oral Take 1,000 Units by mouth daily.      . OMEGA-3 FATTY ACIDS 1000 MG PO CAPS Oral Take 1,200 mg by mouth daily.      Marland Kitchen LISINOPRIL-HYDROCHLOROTHIAZIDE 20-12.5 MG PO TABS Oral Take 1 tablet by mouth daily.      Marland Kitchen SIMVASTATIN 20 MG PO TABS Oral Take 20 mg by mouth at bedtime.        BP 140/90  Pulse 107  Temp(Src) 98.3 F (36.8 C) (Oral)  Resp 16  SpO2 98%  Physical Exam  Nursing note and vitals reviewed. Constitutional: He is oriented to person, place, and time. He appears well-developed and well-nourished. No distress.  HENT:  Head: Normocephalic.  Neck: Neck supple.  Cardiovascular: Normal rate, regular rhythm and normal heart sounds.   Pulmonary/Chest: Effort normal and breath sounds normal.  Musculoskeletal: Normal range of motion. He exhibits no edema.  Neurological: He is alert and oriented to person, place, and time.  Skin: Skin is warm and dry. Rash noted.       erythemous papular rash to the left radial aspect of the forearm extending from just below the elbow into the 1st and second  dorsal fingers of left hand. Non tender. No drainage. No erythema surrounding the lesions. No rash anywhere else on the body.  Psychiatric: He has a normal mood and affect.    ED Course  Procedures (including critical care time)  Rash in dermatoma distribution, suspiciuos for shingles, pt states it does burn. Will try antivirals, benadryl for itching. Pt is otherwise non toxic. NAD. WIll follow up with dermatologist.   MDM          Lottie Mussel, PA 08/13/11 1306

## 2011-08-15 NOTE — ED Provider Notes (Signed)
Medical screening examination/treatment/procedure(s) were performed by non-physician practitioner and as supervising physician I was immediately available for consultation/collaboration.  Cobey Raineri R. Iyan Flett, MD 08/15/11 1458 

## 2011-09-02 ENCOUNTER — Ambulatory Visit (HOSPITAL_BASED_OUTPATIENT_CLINIC_OR_DEPARTMENT_OTHER): Payer: Medicare Other

## 2011-09-02 ENCOUNTER — Other Ambulatory Visit (HOSPITAL_BASED_OUTPATIENT_CLINIC_OR_DEPARTMENT_OTHER): Payer: Medicare Other | Admitting: Lab

## 2011-09-02 ENCOUNTER — Ambulatory Visit (HOSPITAL_BASED_OUTPATIENT_CLINIC_OR_DEPARTMENT_OTHER): Payer: Medicare Other | Admitting: Hematology & Oncology

## 2011-09-02 ENCOUNTER — Other Ambulatory Visit: Payer: Self-pay | Admitting: Hematology & Oncology

## 2011-09-02 ENCOUNTER — Encounter: Payer: Self-pay | Admitting: Hematology & Oncology

## 2011-09-02 VITALS — BP 143/80 | HR 92 | Temp 96.7°F | Ht 65.0 in | Wt 170.0 lb

## 2011-09-02 DIAGNOSIS — D45 Polycythemia vera: Secondary | ICD-10-CM

## 2011-09-02 DIAGNOSIS — Z7982 Long term (current) use of aspirin: Secondary | ICD-10-CM

## 2011-09-02 DIAGNOSIS — B029 Zoster without complications: Secondary | ICD-10-CM

## 2011-09-02 HISTORY — DX: Polycythemia vera: D45

## 2011-09-02 HISTORY — DX: Zoster without complications: B02.9

## 2011-09-02 LAB — CBC WITH DIFFERENTIAL (CANCER CENTER ONLY)
BASO#: 0 10*3/uL (ref 0.0–0.2)
BASO%: 0.4 % (ref 0.0–2.0)
EOS%: 0.9 % (ref 0.0–7.0)
Eosinophils Absolute: 0.1 10*3/uL (ref 0.0–0.5)
HCT: 50.7 % — ABNORMAL HIGH (ref 38.7–49.9)
HGB: 17.2 g/dL — ABNORMAL HIGH (ref 13.0–17.1)
LYMPH#: 2.3 10*3/uL (ref 0.9–3.3)
LYMPH%: 30.7 % (ref 14.0–48.0)
MCH: 30 pg (ref 28.0–33.4)
MCHC: 33.9 g/dL (ref 32.0–35.9)
MCV: 88 fL (ref 82–98)
MONO#: 0.9 10*3/uL (ref 0.1–0.9)
MONO%: 11.6 % (ref 0.0–13.0)
NEUT#: 4.2 10*3/uL (ref 1.5–6.5)
NEUT%: 56.4 % (ref 40.0–80.0)
Platelets: 245 10*3/uL (ref 145–400)
RBC: 5.74 10*6/uL — ABNORMAL HIGH (ref 4.20–5.70)
RDW: 16.4 % — ABNORMAL HIGH (ref 11.1–15.7)
WBC: 7.4 10*3/uL (ref 4.0–10.0)

## 2011-09-02 LAB — CHCC SATELLITE - SMEAR

## 2011-09-02 NOTE — Progress Notes (Signed)
Jesse [name] presents today for phlebotomy per MD orders. Phlebotomy procedure started at 0905 and ended at 0910. 500 grams removed. Patient observed for 30 minutes after procedure without any incident. Patient tolerated procedure well. IV needle removed intact.

## 2011-09-02 NOTE — Progress Notes (Signed)
This office note has been dictated.

## 2011-09-03 NOTE — Progress Notes (Signed)
CC:   [name]. [name], M.D.  DIAGNOSIS:  Polycythemia vera, JAK2 negative.  CURRENT THERAPY: 1. Phlebotomy to maintain a hematocrit less than 45%. 2. Aspirin 81 mg p.o. daily.  INTERIM HISTORY:  Mr. Campanelli comes in for followup.  He is doing okay. He has had no problems with respect to headache or blurred vision.  He did get shingles in the left I think about C6 dermatome.  This appeared to affect his left radial nerve.  It happened a couple of weeks ago.  He says there is still some tingling.  He has had no problems with cough or shortness breath.  He has had no pruritus.  There has been no change in bowel or bladder habits.  He has had a good appetite.  He has had no nausea or vomiting.  PHYSICAL EXAMINATION:  This is a well-developed, well-nourished white gentleman in no obvious distress.  Vital signs:  Temperature 96.7, pulse 92, respiratory rate 14, blood pressure 143/80.  Weight is 170.  Head and neck:  Shows a normocephalic, atraumatic skull.  There are no ocular or oral lesions.  There are no palpable cervical or supraclavicular lymph nodes.  Lungs:  Clear bilaterally.  Cardiac:  Regular rate and rhythm with a normal S1 and S2.  There are no murmurs, rubs or bruits. Abdomen:  Soft with good bowel sounds.  There is no palpable abdominal mass.  There is no fluid wave.  No palpable hepatosplenomegaly. Extremities:  No clubbing, cyanosis or edema.  There is no rash on the left forearm.  He is does have hypersensitivity to touch in the radial nerves distribution of the left forearm.  He has good range motion of his joints.  Neurologic:  No focal neurologic deficits outside of that which I stated previously.  LABORATORY STUDIES:  White cell count is 7.4, hemoglobin 17, hematocrit 50.7, platelet count 245.  MCV is 88.  IMPRESSION:  Mr. Bowdish is a 70 year old gentleman with polycythemia vera.  Will have to go ahead and plan for a phlebotomy today.  I will go ahead and plan to  get him back in about 6 weeks' time to see about his CBC and possibly another phlebotomy.  I did tell him to try some Zostrix cream for the zoster.  This hopefully will help him out a little bit.  I told him to apply this 3 to 4 times a day.    ______________________________ [name], M.D. PRE/MEDQ  D:  09/02/2011  T:  09/03/2011  Job:  [id number]

## 2011-09-30 ENCOUNTER — Emergency Department (HOSPITAL_BASED_OUTPATIENT_CLINIC_OR_DEPARTMENT_OTHER)
Admission: EM | Admit: 2011-09-30 | Discharge: 2011-09-30 | Disposition: A | Discharge status: Home / Self Care | Payer: Medicare Other | Attending: Emergency Medicine | Admitting: Emergency Medicine

## 2011-09-30 ENCOUNTER — Encounter (HOSPITAL_BASED_OUTPATIENT_CLINIC_OR_DEPARTMENT_OTHER): Payer: Self-pay | Admitting: *Deleted

## 2011-09-30 DIAGNOSIS — B0229 Other postherpetic nervous system involvement: Secondary | ICD-10-CM | POA: Insufficient documentation

## 2011-09-30 DIAGNOSIS — X500XXA Overexertion from strenuous movement or load, initial encounter: Secondary | ICD-10-CM | POA: Insufficient documentation

## 2011-09-30 DIAGNOSIS — E7800 Pure hypercholesterolemia, unspecified: Secondary | ICD-10-CM | POA: Insufficient documentation

## 2011-09-30 DIAGNOSIS — Z79899 Other long term (current) drug therapy: Secondary | ICD-10-CM | POA: Insufficient documentation

## 2011-09-30 DIAGNOSIS — S339XXA Sprain of unspecified parts of lumbar spine and pelvis, initial encounter: Secondary | ICD-10-CM | POA: Insufficient documentation

## 2011-09-30 DIAGNOSIS — S39012A Strain of muscle, fascia and tendon of lower back, initial encounter: Secondary | ICD-10-CM

## 2011-09-30 DIAGNOSIS — I10 Essential (primary) hypertension: Secondary | ICD-10-CM | POA: Insufficient documentation

## 2011-09-30 LAB — URINALYSIS, ROUTINE W REFLEX MICROSCOPIC
Bilirubin Urine: NEGATIVE
Glucose, UA: NEGATIVE mg/dL
Hgb urine dipstick: NEGATIVE
Ketones, ur: NEGATIVE mg/dL
Leukocytes, UA: NEGATIVE
Nitrite: NEGATIVE
Protein, ur: NEGATIVE mg/dL
Specific Gravity, Urine: 1.014 (ref 1.005–1.030)
Urobilinogen, UA: 0.2 mg/dL (ref 0.0–1.0)
pH: 6 (ref 5.0–8.0)

## 2011-09-30 MED ORDER — IBUPROFEN 800 MG PO TABS
800.0000 mg | ORAL_TABLET | Freq: Once | ORAL | Status: AC
  Administered 2011-09-30: 800 mg via ORAL
  Filled 2011-09-30: qty 1

## 2011-09-30 MED ORDER — HYDROCODONE-ACETAMINOPHEN 5-500 MG PO TABS: 1.0000 | ORAL_TABLET | Freq: Four times a day (QID) | ORAL | Status: AC | PRN

## 2011-09-30 MED ORDER — NAPROXEN 500 MG PO TABS: 500.0000 mg | ORAL_TABLET | Freq: Two times a day (BID) | ORAL | Status: DC

## 2011-09-30 MED ORDER — GABAPENTIN 100 MG PO CAPS: 100.0000 mg | ORAL_CAPSULE | Freq: Three times a day (TID) | ORAL | Status: DC

## 2011-09-30 NOTE — ED Notes (Addendum)
Pt sts he was doing some lifting a few days ago and now has right side lower back pain. Pt also would like to have his shingles checked. He was seen here for them in November and they are still present.

## 2011-09-30 NOTE — ED Provider Notes (Signed)
History     CSN: [id number]  Arrival date & time 09/30/11  1191   First MD Initiated Contact with Patient 09/30/11 [phone]      Chief Complaint  Patient presents with  . Back Pain    (Consider location/radiation/quality/duration/timing/severity/associated sxs/prior treatment) HPI Comments: Patient also has history of shingles to the left arm for which he states he has continued pain in that distribution of the left forearm. No loss of bowel or bladder function. He has no saddle anesthesia. He paresthesias.  Patient is a 71 y.o. male presenting with back pain. The history is provided by the patient. No language interpreter was used.  Back Pain  This is a new problem. The current episode started 2 days ago. The problem occurs constantly. The problem has been gradually worsening. The pain is associated with lifting heavy objects. The pain is present in the lumbar spine. The quality of the pain is described as aching. The pain does not radiate. The pain is mild. The symptoms are aggravated by bending, twisting and certain positions (lying down). The pain is the same all the time. Stiffness is present all day. Pertinent negatives include no chest pain, no fever, no numbness, no headaches, no abdominal pain, no dysuria and no weakness. He has tried nothing for the symptoms. The treatment provided no relief.    Past Medical History  Diagnosis Date  . Hypertension   . Hypercholesteremia   . Polycythemia rubra vera 09/02/2011  . HZ (herpes zoster) 09/02/2011    History reviewed. No pertinent past surgical history.  No family history on file.  History  Substance Use Topics  . Smoking status: Never Smoker   . Smokeless tobacco: Not on file  . Alcohol Use: No      Review of Systems  Constitutional: Negative for fever, activity change, appetite change and fatigue.  HENT: Negative for congestion, sore throat, rhinorrhea, neck pain and neck stiffness.   Respiratory: Negative for cough and  shortness of breath.   Cardiovascular: Negative for chest pain and palpitations.  Gastrointestinal: Negative for nausea, vomiting and abdominal pain.  Genitourinary: Negative for dysuria, urgency, frequency, flank pain and decreased urine volume.  Musculoskeletal: Positive for back pain. Negative for myalgias, joint swelling, arthralgias and gait problem.  Neurological: Negative for dizziness, weakness, light-headedness, numbness and headaches.  All other systems reviewed and are negative.    Allergies  Review of patient's allergies indicates no known allergies.  Home Medications   Current Outpatient Rx  Name Route Sig Dispense Refill  . ALPRAZOLAM 0.5 MG PO TABS Oral Take 0.5 mg by mouth at bedtime as needed.      Marland Kitchen VITAMIN D 1000 UNITS PO TABS Oral Take 1,000 Units by mouth daily.      . OMEGA-3 FATTY ACIDS 1000 MG PO CAPS Oral Take 1,200 mg by mouth daily.      Marland Kitchen GABAPENTIN 100 MG PO CAPS Oral Take 1 capsule (100 mg total) by mouth 3 (three) times daily. 30 capsule 0  . HYDROCODONE-ACETAMINOPHEN 5-500 MG PO TABS Oral Take 1-2 tablets by mouth every 6 (six) hours as needed for pain. 15 tablet 0  . LISINOPRIL-HYDROCHLOROTHIAZIDE 20-12.5 MG PO TABS Oral Take 1 tablet by mouth daily.      Marland Kitchen NAPROXEN 500 MG PO TABS Oral Take 1 tablet (500 mg total) by mouth 2 (two) times daily. 30 tablet 0  . SIMVASTATIN 20 MG PO TABS Oral Take 20 mg by mouth at bedtime.  BP 142/82  Pulse 94  Temp(Src) 98.6 F (37 C) (Oral)  Resp 18  SpO2 99%  Physical Exam  Nursing note and vitals reviewed. Constitutional: He is oriented to person, place, and time. He appears well-developed and well-nourished. No distress.  HENT:  Head: Normocephalic and atraumatic.  Mouth/Throat: Oropharynx is clear and moist.  Eyes: Conjunctivae and EOM are normal. Pupils are equal, round, and reactive to light.  Neck: Normal range of motion. Neck supple.  Cardiovascular: Normal rate, regular rhythm, normal heart  sounds and intact distal pulses.   Pulmonary/Chest: Effort normal and breath sounds normal. No respiratory distress.  Abdominal: Soft. Bowel sounds are normal. There is no tenderness.  Musculoskeletal:       Lumbar back: He exhibits tenderness and pain. He exhibits normal range of motion, no bony tenderness, no swelling, no deformity and no spasm.       Tenderness on palpation paraspinal and lateral musculature in the R side of LS spine distribution  Neurological: He is alert and oriented to person, place, and time. He has normal strength and normal reflexes. No cranial nerve deficit or sensory deficit.  Skin: Skin is warm and dry. No rash noted.    ED Course  Procedures (including critical care time)   Labs Reviewed  URINALYSIS, ROUTINE W REFLEX MICROSCOPIC   No results found.   1. Lumbosacral strain   2. Post herpetic neuralgia       MDM  Lumbosacral strain. I have no concern for malignant cause of back pain such as cauda equina or epidural abscess. There is no indication for imaging at this time. Urinalysis is negative. He'll be discharged home inflammatory medication, pain medication. I will see Ms. postherpetic neuralgia as he is pain in a dermatome. I will place him on Neurontin with instructions to follow closely with his primary care physician.        Dayton Bailiff, MD 09/30/11 1054

## 2011-10-14 ENCOUNTER — Telehealth: Payer: Self-pay | Admitting: Hematology & Oncology

## 2011-10-14 NOTE — Telephone Encounter (Signed)
Pt moved 1-23 to 1-30

## 2011-10-16 ENCOUNTER — Other Ambulatory Visit: Payer: Medicare Other | Admitting: Lab

## 2011-10-16 ENCOUNTER — Ambulatory Visit: Payer: Medicare Other | Admitting: Hematology & Oncology

## 2011-10-23 ENCOUNTER — Ambulatory Visit (HOSPITAL_BASED_OUTPATIENT_CLINIC_OR_DEPARTMENT_OTHER): Payer: Medicare Other | Admitting: Hematology & Oncology

## 2011-10-23 ENCOUNTER — Other Ambulatory Visit (HOSPITAL_BASED_OUTPATIENT_CLINIC_OR_DEPARTMENT_OTHER): Payer: Medicare Other | Admitting: Lab

## 2011-10-23 ENCOUNTER — Ambulatory Visit (HOSPITAL_BASED_OUTPATIENT_CLINIC_OR_DEPARTMENT_OTHER): Payer: Medicare Other

## 2011-10-23 VITALS — BP 113/70 | HR 82

## 2011-10-23 DIAGNOSIS — D45 Polycythemia vera: Secondary | ICD-10-CM

## 2011-10-23 LAB — CBC WITH DIFFERENTIAL (CANCER CENTER ONLY)
BASO#: 0 10*3/uL (ref 0.0–0.2)
BASO%: 0.3 % (ref 0.0–2.0)
EOS%: 1.4 % (ref 0.0–7.0)
Eosinophils Absolute: 0.1 10*3/uL (ref 0.0–0.5)
HCT: 47.7 % (ref 38.7–49.9)
HGB: 16.2 g/dL (ref 13.0–17.1)
LYMPH#: 2.2 10*3/uL (ref 0.9–3.3)
LYMPH%: 32 % (ref 14.0–48.0)
MCH: 29.7 pg (ref 28.0–33.4)
MCHC: 34 g/dL (ref 32.0–35.9)
MCV: 88 fL (ref 82–98)
MONO#: 1.1 10*3/uL — ABNORMAL HIGH (ref 0.1–0.9)
MONO%: 16.3 % — ABNORMAL HIGH (ref 0.0–13.0)
NEUT#: 3.5 10*3/uL (ref 1.5–6.5)
NEUT%: 50 % (ref 40.0–80.0)
Platelets: 279 10*3/uL (ref 145–400)
RBC: 5.45 10*6/uL (ref 4.20–5.70)
RDW: 15.8 % — ABNORMAL HIGH (ref 11.1–15.7)
WBC: 7 10*3/uL (ref 4.0–10.0)

## 2011-10-23 LAB — FERRITIN: Ferritin: 17 ng/mL — ABNORMAL LOW (ref 22–322)

## 2011-10-23 NOTE — Progress Notes (Signed)
Jesse [name] presents today for phlebotomy per MD orders. Phlebotomy procedure started at 0915 and ended at 0930  500 grams removed. Patient observed for 30 minutes after procedure without any incident. Patient tolerated procedure well. IV needle removed intact.

## 2011-10-23 NOTE — Progress Notes (Signed)
This office note has been dictated.

## 2011-10-24 NOTE — Progress Notes (Signed)
CC:   Jesse [name]. [name], M.D.  DIAGNOSIS:  Polycythemia vera, JAK2 negative.  CURRENT THERAPY: 1. Phlebotomy to maintain hematocrit less than 45%. 2. Aspirin 81 mg p.o. daily.  INTERIM HISTORY:  Jesse [name] comes in for follow-up.  He is doing okay. His last phlebotomy was I think back in December.  By that point in time, his hematocrit was 50.7.  He has had no problems with headache.  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  He had shingles, and this is improving but still occasionally bothers him.  Particularly, he gets bothered when he gets stressed.  He has had no problems with cough.  He has had no change in bowel or bladder habits.  His last ferritin was 18 back in September.  PHYSICAL EXAMINATION:  General Appearance:  This is a well-developed, well-nourished white gentleman in no obvious distress.  Vital Signs: 97.5, pulse 99, respiratory rate 14, blood pressure 126/86.  Weight is 170.  Head and Neck Exam:  Shows a normocephalic, atraumatic skull. There are no ocular or oral lesions.  He has no scleral icterus.  He has no adenopathy in his neck.  Thyroid is not palpable.  Lungs:  Clear bilaterally.  Cardiac Exam:  Regular rate and rhythm with a normal S1 and S2.  There are no murmurs, rubs or bruits.  Abdominal Exam:  Soft with good bowel sounds.  There is no palpable abdominal mass.  There is no palpable hepatosplenomegaly.  Back Exam:  No tenderness over the spine, ribs or hips.  Extremities:  Show no clubbing, cyanosis or edema. Neurological Exam:  Shows no focal neurological deficits.  LABORATORY STUDIES:  White cell count is 17, hemoglobin 16.2, hematocrit 47.7, platelet count 279.  MCV is 88.  IMPRESSION:  Jesse [name] is a 71 year old gentleman with polycythemia vera.  He is doing well with this.  We have had no complications with the polycythemia to date.  Given the results of the new trial that came out, we want to make  sure we are aggressive in keeping his hematocrit below 45%.  As such, he will get phlebotomized today.  I want to see him back in about 6 weeks' time.    ______________________________ Jesse [name], M.D. PRE/MEDQ  D:  10/23/2011  T:  10/24/2011  Job:  [id number]

## 2011-12-05 ENCOUNTER — Ambulatory Visit (HOSPITAL_BASED_OUTPATIENT_CLINIC_OR_DEPARTMENT_OTHER): Payer: Medicare Other | Admitting: Hematology & Oncology

## 2011-12-05 ENCOUNTER — Ambulatory Visit (HOSPITAL_BASED_OUTPATIENT_CLINIC_OR_DEPARTMENT_OTHER): Payer: Medicare Other

## 2011-12-05 ENCOUNTER — Other Ambulatory Visit (HOSPITAL_BASED_OUTPATIENT_CLINIC_OR_DEPARTMENT_OTHER): Payer: Medicare Other | Admitting: Lab

## 2011-12-05 VITALS — BP 122/61 | HR 80

## 2011-12-05 VITALS — BP 111/59 | HR 102 | Temp 97.7°F | Ht 65.0 in | Wt 171.0 lb

## 2011-12-05 DIAGNOSIS — D45 Polycythemia vera: Secondary | ICD-10-CM

## 2011-12-05 LAB — CBC WITH DIFFERENTIAL (CANCER CENTER ONLY)
BASO#: 0 10*3/uL (ref 0.0–0.2)
BASO%: 0.3 % (ref 0.0–2.0)
EOS%: 0.4 % (ref 0.0–7.0)
Eosinophils Absolute: 0 10*3/uL (ref 0.0–0.5)
HCT: 46.5 % (ref 38.7–49.9)
HGB: 15.7 g/dL (ref 13.0–17.1)
LYMPH#: 2.2 10*3/uL (ref 0.9–3.3)
LYMPH%: 28.5 % (ref 14.0–48.0)
MCH: 29.1 pg (ref 28.0–33.4)
MCHC: 33.8 g/dL (ref 32.0–35.9)
MCV: 86 fL (ref 82–98)
MONO#: 0.9 10*3/uL (ref 0.1–0.9)
MONO%: 11.8 % (ref 0.0–13.0)
NEUT#: 4.6 10*3/uL (ref 1.5–6.5)
NEUT%: 59 % (ref 40.0–80.0)
Platelets: 298 10*3/uL (ref 145–400)
RBC: 5.39 10*6/uL (ref 4.20–5.70)
RDW: 14.6 % (ref 11.1–15.7)
WBC: 7.8 10*3/uL (ref 4.0–10.0)

## 2011-12-05 NOTE — Progress Notes (Signed)
Diagnosis: Polycythemia vera- JAK2  Negative  Current therapy: #1 phlebotomy to maintain hematocrit less than 45%.  #2 aspirin 81 mg by mouth q. day  Interim history: Jesse [name] comes in for followup. He is doing okay. He was last phlebotomized back in January. We're trying to be aggressive with his phlebotomy program.  He's had no cough or shortness of breath. He said no fever sweats or chills. He has had no nausea vomiting. He's had no headache.  Physical exam shows a well-developed well-nourished white gentleman in no obvious distress. Vital signs 977 pulse 102. Moderate 16. Blood pressure 111/59. Weight is 171 pounds. Head and neck exam shows a normocephalic and atraumatic skull. There are no other oral lesions.  no scleral icterus. No adenopathy in the neck. Lungs are clear bilaterally. Cardiac exam regular rate and rhythm with no murmurs rubs or bruits. Abdominal exam soft with good bowel sounds. There is no palpable abdominal mass. There's no palpable hepatospleno megaly. Extremities shows no clubbing cyanosis or edema. Neurological exam shows no focal neurological deficits. Skin exam shows no facial plethora. He does have a ruddy skin complexion.  Lab studies showed a white cell count of 7.8 hemoglobin 15.7 hematocrit 46.5 reticulocyte count to 98.  Impression :  Jesse [name] is a 71 year old gentleman with polycythemia. So far, there've been no complications. We will go ahead and phlebotomize him today.   We will plan to get him back in 2 more months for followup.

## 2012-02-12 ENCOUNTER — Other Ambulatory Visit: Payer: Medicare Other | Admitting: Lab

## 2012-02-12 ENCOUNTER — Ambulatory Visit (HOSPITAL_BASED_OUTPATIENT_CLINIC_OR_DEPARTMENT_OTHER): Payer: Medicare Other | Admitting: Hematology & Oncology

## 2012-02-12 ENCOUNTER — Ambulatory Visit: Payer: Medicare Other | Admitting: Hematology & Oncology

## 2012-02-12 ENCOUNTER — Ambulatory Visit (HOSPITAL_BASED_OUTPATIENT_CLINIC_OR_DEPARTMENT_OTHER): Payer: Medicare Other

## 2012-02-12 ENCOUNTER — Other Ambulatory Visit (HOSPITAL_BASED_OUTPATIENT_CLINIC_OR_DEPARTMENT_OTHER): Payer: Medicare Other | Admitting: Lab

## 2012-02-12 VITALS — BP 116/76 | HR 94 | Temp 97.5°F

## 2012-02-12 VITALS — BP 115/71 | HR 94 | Temp 97.0°F | Ht 66.0 in | Wt 176.0 lb

## 2012-02-12 DIAGNOSIS — D45 Polycythemia vera: Secondary | ICD-10-CM

## 2012-02-12 LAB — CBC WITH DIFFERENTIAL (CANCER CENTER ONLY)
BASO#: 0 10*3/uL (ref 0.0–0.2)
BASO%: 0.2 % (ref 0.0–2.0)
EOS%: 1.4 % (ref 0.0–7.0)
Eosinophils Absolute: 0.1 10*3/uL (ref 0.0–0.5)
HCT: 46.7 % (ref 38.7–49.9)
HGB: 15.5 g/dL (ref 13.0–17.1)
LYMPH#: 2.6 10*3/uL (ref 0.9–3.3)
LYMPH%: 27.5 % (ref 14.0–48.0)
MCH: 28.4 pg (ref 28.0–33.4)
MCHC: 33.2 g/dL (ref 32.0–35.9)
MCV: 86 fL (ref 82–98)
MONO#: 1.4 10*3/uL — ABNORMAL HIGH (ref 0.1–0.9)
MONO%: 15.3 % — ABNORMAL HIGH (ref 0.0–13.0)
NEUT#: 5.3 10*3/uL (ref 1.5–6.5)
NEUT%: 55.6 % (ref 40.0–80.0)
Platelets: 256 10*3/uL (ref 145–400)
RBC: 5.45 10*6/uL (ref 4.20–5.70)
RDW: 15.5 % (ref 11.1–15.7)
WBC: 9.4 10*3/uL (ref 4.0–10.0)

## 2012-02-12 NOTE — Progress Notes (Signed)
This office note has been dictated.

## 2012-02-12 NOTE — Progress Notes (Signed)
Fran Lowes presents today for phlebotomy per MD orders. Phlebotomy procedure started at 1455 and ended at 1505 500 ml removed. Patient observed for 30 minutes after procedure without any incident. Patient tolerated procedure well. IV needle removed intact.  See vitals on vital sheet.  Pt left after 15 min after phlebotomy. Pt state he want to go.

## 2012-02-12 NOTE — Patient Instructions (Signed)
Therapeutic Phlebotomy Therapeutic phlebotomy is the controlled removal of blood from your body for the purpose of treating a medical condition. It is similar to donating blood. Usually, about a pint (470 mL) of blood is removed. The average adult has 9 to 12 pints (4.3 to 5.7 L) of blood. Therapeutic phlebotomy may be used to treat the following medical conditions:  Hemochromatosis. This is a condition in which there is too much iron in the blood.   Polycythemia vera. This is a condition in which there are too many red cells in the blood.   Porphyria cutanea tarda. This is a disease usually passed from one generation to the next (inherited). It is a condition in which an important part of hemoglobin is not made properly. This results in the build up of abnormal amounts of porphyrins in the body.   Sickle cell disease. This is an inherited disease. It is a condition in which the red blood cells form an abnormal crescent shape rather than a round shape.  LET YOUR CAREGIVER KNOW ABOUT:  Allergies.   Medicines taken including herbs, eyedrops, over-the-counter medicines, and creams.   Use of steroids (by mouth or creams).   Previous problems with anesthetics or numbing medicine.   History of blood clots.   History of bleeding or blood problems.   Previous surgery.   Possibility of pregnancy, if this applies.  RISKS AND COMPLICATIONS This is a simple and safe procedure. Problems are unlikely. However, problems can occur and may include:  Nausea or lightheadedness.   Low blood pressure.   Soreness, bleeding, swelling, or bruising at the needle insertion site.   Infection.  BEFORE THE PROCEDURE  This is a procedure that can be done as an outpatient. Confirm the time that you need to arrive for your procedure. Confirm whether there is a need to fast or withhold any medications. It is helpful to wear clothing with sleeves that can be raised above the elbow. A blood sample may be done  to determine the amount of red blood cells or iron in your blood. Plan ahead of time to have someone drive you home after the procedure. PROCEDURE The entire procedure from preparation through recovery takes about 1 hour. The actual collection takes about 10 to 15 minutes.  A needle will be inserted into your vein.   Tubing and a collection bag will be attached to that needle.   Blood will flow through the needle and tubing into the collection bag.   You may be asked to open and close your hand slowly and continuously during the entire collection.   Once the specified amount of blood has been removed from your body, the collection bag and tubing will be clamped.   The needle will be removed.   Pressure will be held on the site of the needle insertion to stop the bleeding. Then a bandage will be placed over the needle insertion site.  AFTER THE PROCEDURE  Your recovery will be assessed and monitored. If there are no problems, as an outpatient, you should be able to go home shortly after the procedure.  Document Released: 02/11/2011 Document Revised: 08/29/2011 Document Reviewed: 02/11/2011 [employer] Patient Information 2012 [employer]. 

## 2012-02-13 NOTE — Progress Notes (Signed)
CC:   Jesse [name]. [name], M.D.  DIAGNOSIS:  Polycythemia vera, JAK2-negative.  CURRENT THERAPY: 1. Phlebotomy to maintain hematocrit less than 45%. 2. Aspirin 81 mg p.o. daily.  INTERIM HISTORY:  Jesse [name] comes in for his followup.  He is feeling pretty well.  He has had no complaints with respect to headache or double vision or blurred vision.  He has had resolution of the postherpetic neuralgia in the left C6 dermatome.  He is still pretty active.  He still tries to play golf when he can.  His last ferritin that we have on him was 17 back in January.  He has had no change in bowel or bladder habits.  There has been no swelling in his legs.  He has had no rashes.  There has been no change in his medications.  PHYSICAL EXAMINATION:  This is a well-developed, well-nourished white gentleman in no obvious distress.  Vital signs:  Temperature of 97, pulse 94, respiratory rate 18, blood pressure 115/71.  Weight is 176. Head and neck:  A normocephalic, atraumatic skull.  There are no ocular or oral lesions.  There are no palpable cervical or supraclavicular lymph nodes.  Lungs:  Clear to percussion and auscultation bilaterally. Cardiac:  Regular rate and rhythm with a normal S1 and S2.  There are no murmurs, rubs or bruits.  Abdomen:  Soft with good bowel sounds.  There is no palpable abdominal mass.  There is no fluid wave.  No palpable hepatosplenomegaly is noted.  Back:  No tenderness over the spine, ribs, or hips.  Extremities:  No clubbing, cyanosis or edema.  Neurologic:  No focal neurological deficits.  Skin:  Does show some facial plethora.  He does have a little bit of a ruddy complexion.  LABORATORY STUDIES:  White cell count 9.4, hemoglobin 15.5, hematocrit 46.7, platelet count is 256.  MCV is 86.  IMPRESSION:  Jesse [name] is a 71 year old gentleman with polycythemia vera.  He has had no complications from this.  We are trying to phlebotomize him aggressively.  We will  go ahead and phlebotomize him now.  Hopefully, we can get him through the summertime without having to get him back.  I will plan to see him back myself in another 4 months.    ______________________________ [name], M.D. PRE/MEDQ  D:  02/12/2012  T:  02/13/2012  Job:  [id number]

## 2012-02-22 ENCOUNTER — Encounter (HOSPITAL_COMMUNITY): Payer: Self-pay | Admitting: Emergency Medicine

## 2012-02-22 ENCOUNTER — Emergency Department (HOSPITAL_COMMUNITY)
Admission: EM | Admit: 2012-02-22 | Discharge: 2012-02-22 | Disposition: A | Discharge status: Home / Self Care | Payer: Medicare Other | Attending: Emergency Medicine | Admitting: Emergency Medicine

## 2012-02-22 DIAGNOSIS — Z7982 Long term (current) use of aspirin: Secondary | ICD-10-CM | POA: Insufficient documentation

## 2012-02-22 DIAGNOSIS — R209 Unspecified disturbances of skin sensation: Secondary | ICD-10-CM | POA: Insufficient documentation

## 2012-02-22 DIAGNOSIS — E7800 Pure hypercholesterolemia, unspecified: Secondary | ICD-10-CM | POA: Insufficient documentation

## 2012-02-22 DIAGNOSIS — Z79899 Other long term (current) drug therapy: Secondary | ICD-10-CM | POA: Insufficient documentation

## 2012-02-22 DIAGNOSIS — I10 Essential (primary) hypertension: Secondary | ICD-10-CM | POA: Insufficient documentation

## 2012-02-22 DIAGNOSIS — R200 Anesthesia of skin: Secondary | ICD-10-CM

## 2012-02-22 NOTE — ED Notes (Signed)
PT. REPORTS TINGLING AT LEFT CALF THIS EVENING , AND TINGLING AT LEFT FOREARM FOR 6 MONTHS DUE TO SHINGLES, STATES " I JUST WANT TO BE CHECK IF I HAVE STROKE "  , SPEECH CLEAR / NO FACIAL SYMMETRY , EQUAL STRONG GRIPS , AMBULATORY , ALERT AND ORIENTED. DENIES CHEST PAIN / NO SOB.

## 2012-02-22 NOTE — ED Notes (Addendum)
Pt states that he was having tingling in his left calf muscle. Pt was worried about possible stroke like symptoms due to history. Pt is alert and oriented x 3 has no neuro deficits. Pt able to follow commands and move all extremities. Pt has equal grips and stregth bilaterally. Pt states he has phelbotomy every three months last draw was last week.

## 2012-02-22 NOTE — ED Notes (Signed)
Pt no longer having tingling feeling. EDP at bedside.

## 2012-02-22 NOTE — ED Provider Notes (Signed)
History     CSN: [id number]  Arrival date & time 02/22/12  0116   First MD Initiated Contact with Patient 02/22/12 0157      Chief Complaint  Patient presents with  . Tingling     The history is provided by the patient.   numbness  Onset - just prior to arrival Course - improving Duration - several minutes Worsened by - nothing Improved by - nothing  Pt reports he woke up with numbness in his left calf No focal weakness in his legs No arm weakness.  No facial droop.  No slurred speech No back or neck pain, no cp/sob.  No headache and now visual changes No dizziness reported His symptoms are resolved No abdominal pain Denies h/o CVA He reports h/o polycythemia vera with phlebotomy every 3 months, last CBC check was ok per patient He reports chronic numbness to left UE from postherpetic neuralgia that is not new  Past Medical History  Diagnosis Date  . Hypertension   . Hypercholesteremia   . Polycythemia rubra vera 09/02/2011  . HZ (herpes zoster) 09/02/2011    History reviewed. No pertinent past surgical history.  No family history on file.  History  Substance Use Topics  . Smoking status: Never Smoker   . Smokeless tobacco: Not on file  . Alcohol Use: No      Review of Systems  Constitutional: Negative for fever.  Respiratory: Negative for shortness of breath.   Cardiovascular: Negative for chest pain and leg swelling.  Skin: Negative for color change.  All other systems reviewed and are negative.    Allergies  Review of patient's allergies indicates no known allergies.  Home Medications   Current Outpatient Rx  Name Route Sig Dispense Refill  . ALPRAZOLAM 0.5 MG PO TABS Oral Take 0.25 mg by mouth at bedtime as needed. For sleep    . ASPIRIN EC 81 MG PO TBEC Oral Take 81 mg by mouth daily.    Marland Kitchen VITAMIN D 1000 UNITS PO TABS Oral Take 1,000 Units by mouth daily.      . OMEGA-3 FATTY ACIDS 1000 MG PO CAPS Oral Take 1 g by mouth daily.     Marland Kitchen  LISINOPRIL-HYDROCHLOROTHIAZIDE 20-12.5 MG PO TABS Oral Take 1 tablet by mouth daily.      Marland Kitchen NAPROXEN 500 MG PO TABS Oral Take 500 mg by mouth 2 (two) times daily with a meal. pain    . POLYETHYL GLYCOL-PROPYL GLYCOL 0.4-0.3 % OP SOLN Both Eyes Place 1 drop into both eyes 2 (two) times daily.    Marland Kitchen SIMVASTATIN 20 MG PO TABS Oral Take 20 mg by mouth at bedtime.        BP 139/89  Pulse 78  Temp(Src) 97.8 F (36.6 C) (Oral)  Resp 18  SpO2 98%  Physical Exam CONSTITUTIONAL: Well developed/well nourished HEAD AND FACE: Normocephalic/atraumatic EYES: EOMI/PERRL ENMT: Mucous membranes moist NECK: supple no meningeal signs SPINE:entire spine nontender CV: S1/S2 noted, no murmurs/rubs/gallops noted LUNGS: Lungs are clear to auscultation bilaterally, no apparent distress ABDOMEN: soft, nontender, no rebound or guarding GU:no cva tenderness NEURO: Awake/alert, facies symmetric, no arm or leg drift is noted Cranial nerves 3/4/5/6/03/31/09/11/12 tested and intact Gait normal No focal weakness in his legs.  No clonus.  No sensory deficit his legs EXTREMITIES: pulses normal, full ROM SKIN: warm, color normal PSYCH: no abnormalities of mood noted  ED Course  Procedures     1. Numbness    Pt here for transient  numbness that has resolved H/o polycythemia, recent labs reviewed and normal Well appearing suspicion for acute neurologic or vascular process is low  The patient appears reasonably screened and/or stabilized for discharge and I doubt any other medical condition or other [facility] requiring further screening, evaluation, or treatment in the ED at this time prior to discharge.    MDM  Nursing notes reviewed and considered in documentation Previous records reviewed and considered         Joya Gaskins, MD 02/22/12 [phone]

## 2012-06-03 ENCOUNTER — Encounter (HOSPITAL_COMMUNITY): Payer: Self-pay | Admitting: *Deleted

## 2012-06-03 ENCOUNTER — Encounter (INDEPENDENT_AMBULATORY_CARE_PROVIDER_SITE_OTHER): Payer: Medicare Other | Admitting: Ophthalmology

## 2012-06-03 DIAGNOSIS — H33009 Unspecified retinal detachment with retinal break, unspecified eye: Secondary | ICD-10-CM

## 2012-06-03 DIAGNOSIS — H43819 Vitreous degeneration, unspecified eye: Secondary | ICD-10-CM

## 2012-06-03 DIAGNOSIS — H2510 Age-related nuclear cataract, unspecified eye: Secondary | ICD-10-CM

## 2012-06-03 DIAGNOSIS — H33309 Unspecified retinal break, unspecified eye: Secondary | ICD-10-CM

## 2012-06-03 MED ORDER — CEFAZOLIN SODIUM-DEXTROSE 2-3 GM-% IV SOLR
2.0000 g | INTRAVENOUS | Status: AC
  Administered 2012-06-04: 2 g via INTRAVENOUS
  Filled 2012-06-03: qty 50

## 2012-06-03 NOTE — H&P (Signed)
Jesse [name] is an 71 y.o. male.   Chief Complaint  Hairs in the vision   Loss of visual field OS HPI: 3 weeks of floaters  Past Medical History  Diagnosis Date  . Hypertension   . Hypercholesteremia   . Polycythemia rubra vera 09/02/2011  . HZ (herpes zoster) 09/02/2011    No past surgical history on file.  No family history on file. Social History:  reports that he has never smoked. He does not have any smokeless tobacco history on file. He reports that he does not drink alcohol or use illicit drugs.  Allergies: No Known Allergies  No prescriptions prior to admission    Review of systems otherwise negative  There were no vitals taken for this visit.  Physical exam: Mental status: oriented x3. Eyes: See eye exam associated with this date of surgery in media tab.  Scanned in by scanning center Ears, Nose, Throat: within normal limits Neck: Within Normal limits General: within normal limits Chest: Within normal limits Breast: deferred Heart: Within normal limits Abdomen: Within normal limits GU: deferred Extremities: within normal limits Skin: within normal limits  Assessment/Plan Rhegmatogenous Retinal detachment left eye Plan: To [facility] for Scleral buckle with laser treatment and gas injection.  Possible pars plana vitrectomy left eye  Sherrie George 06/03/2012, 1:01 PM

## 2012-06-03 NOTE — Progress Notes (Signed)
Requested EKG, CXR if available from Dr. Mathews Robinsons office - [facility].

## 2012-06-04 ENCOUNTER — Ambulatory Visit (HOSPITAL_COMMUNITY)
Admission: RE | Admit: 2012-06-04 | Discharge: 2012-06-05 | Disposition: A | Discharge status: Home / Self Care | Payer: Medicare Other | Source: Ambulatory Visit | Attending: Ophthalmology | Admitting: Ophthalmology

## 2012-06-04 ENCOUNTER — Encounter (HOSPITAL_COMMUNITY): Payer: Self-pay | Admitting: Certified Registered"

## 2012-06-04 ENCOUNTER — Ambulatory Visit (HOSPITAL_COMMUNITY): Payer: Medicare Other | Admitting: Certified Registered"

## 2012-06-04 ENCOUNTER — Ambulatory Visit (HOSPITAL_COMMUNITY): Payer: Medicare Other

## 2012-06-04 ENCOUNTER — Encounter (HOSPITAL_COMMUNITY)
Admission: RE | Disposition: A | Discharge status: Home / Self Care | Payer: Self-pay | Source: Ambulatory Visit | Attending: Ophthalmology

## 2012-06-04 ENCOUNTER — Encounter (HOSPITAL_COMMUNITY): Payer: Self-pay | Admitting: *Deleted

## 2012-06-04 DIAGNOSIS — H33009 Unspecified retinal detachment with retinal break, unspecified eye: Secondary | ICD-10-CM

## 2012-06-04 DIAGNOSIS — I10 Essential (primary) hypertension: Secondary | ICD-10-CM | POA: Insufficient documentation

## 2012-06-04 DIAGNOSIS — E7800 Pure hypercholesterolemia, unspecified: Secondary | ICD-10-CM | POA: Insufficient documentation

## 2012-06-04 HISTORY — PX: GAS INSERTION: SHX5336

## 2012-06-04 HISTORY — PX: SCLERAL BUCKLE: SHX5340

## 2012-06-04 LAB — CBC
HCT: 47.2 % (ref 39.0–52.0)
Hemoglobin: 16.2 g/dL (ref 13.0–17.0)
MCH: 29.4 pg (ref 26.0–34.0)
MCHC: 34.3 g/dL (ref 30.0–36.0)
MCV: 85.7 fL (ref 78.0–100.0)
Platelets: 246 10*3/uL (ref 150–400)
RBC: 5.51 MIL/uL (ref 4.22–5.81)
RDW: 16.1 % — ABNORMAL HIGH (ref 11.5–15.5)
WBC: 8.2 10*3/uL (ref 4.0–10.5)

## 2012-06-04 LAB — BASIC METABOLIC PANEL
BUN: 11 mg/dL (ref 6–23)
CO2: 21 mEq/L (ref 19–32)
Calcium: 10 mg/dL (ref 8.4–10.5)
Chloride: 101 mEq/L (ref 96–112)
Creatinine, Ser: 0.76 mg/dL (ref 0.50–1.35)
GFR calc Af Amer: >90 mL/min (ref >90)
GFR calc non Af Amer: >90 mL/min (ref >90)
Glucose, Bld: 114 mg/dL — ABNORMAL HIGH (ref 70–99)
Potassium: 3.8 mEq/L (ref 3.5–5.1)
Sodium: 135 mEq/L (ref 135–145)

## 2012-06-04 LAB — SURGICAL PCR SCREEN
MRSA, PCR: NEGATIVE
Staphylococcus aureus: NEGATIVE

## 2012-06-04 SURGERY — SCLERAL BUCKLE
Anesthesia: General | Site: Eye | Laterality: Left | Wound class: Clean

## 2012-06-04 MED ORDER — BUPIVACAINE HCL 0.75 % IJ SOLN
INTRAMUSCULAR | Status: AC
  Filled 2012-06-04: qty 10

## 2012-06-04 MED ORDER — GATIFLOXACIN 0.5 % OP SOLN
1.0000 [drp] | OPHTHALMIC | Status: AC | PRN
  Administered 2012-06-04 (×3): 1 [drp] via OPHTHALMIC
  Filled 2012-06-04: qty 2.5

## 2012-06-04 MED ORDER — PHENYLEPHRINE HCL 2.5 % OP SOLN
1.0000 [drp] | OPHTHALMIC | Status: AC | PRN
  Administered 2012-06-04 (×3): 1 [drp] via OPHTHALMIC
  Filled 2012-06-04: qty 3

## 2012-06-04 MED ORDER — PROPOFOL 10 MG/ML IV BOLUS
INTRAVENOUS | Status: DC | PRN
  Administered 2012-06-04: 150 mg via INTRAVENOUS

## 2012-06-04 MED ORDER — LIDOCAINE HCL 4 % MT SOLN
OROMUCOSAL | Status: DC | PRN
  Administered 2012-06-04: 4 mL via TOPICAL

## 2012-06-04 MED ORDER — PREDNISOLONE ACETATE 1 % OP SUSP
1.0000 [drp] | Freq: Four times a day (QID) | OPHTHALMIC | Status: DC
  Filled 2012-06-04: qty 1

## 2012-06-04 MED ORDER — DOCUSATE SODIUM 100 MG PO CAPS
100.0000 mg | ORAL_CAPSULE | Freq: Two times a day (BID) | ORAL | Status: DC
  Administered 2012-06-04: 100 mg via ORAL

## 2012-06-04 MED ORDER — HYDROCODONE-ACETAMINOPHEN 5-325 MG PO TABS: 1.0000 | ORAL_TABLET | ORAL | Status: DC | PRN

## 2012-06-04 MED ORDER — HYDROMORPHONE HCL PF 1 MG/ML IJ SOLN
INTRAMUSCULAR | Status: AC
  Filled 2012-06-04: qty 1

## 2012-06-04 MED ORDER — HYDROMORPHONE HCL PF 1 MG/ML IJ SOLN
0.2500 mg | INTRAMUSCULAR | Status: DC | PRN
  Administered 2012-06-04: 0.5 mg via INTRAVENOUS

## 2012-06-04 MED ORDER — MIDAZOLAM HCL 5 MG/5ML IJ SOLN
INTRAMUSCULAR | Status: DC | PRN
  Administered 2012-06-04: 2 mg via INTRAVENOUS

## 2012-06-04 MED ORDER — ONDANSETRON HCL 4 MG/2ML IJ SOLN
INTRAMUSCULAR | Status: AC
  Administered 2012-06-04: 4 mg
  Filled 2012-06-04: qty 2

## 2012-06-04 MED ORDER — FENTANYL CITRATE 0.05 MG/ML IJ SOLN
INTRAMUSCULAR | Status: DC | PRN
  Administered 2012-06-04: 75 ug via INTRAVENOUS
  Administered 2012-06-04: 125 ug via INTRAVENOUS

## 2012-06-04 MED ORDER — ONDANSETRON HCL 4 MG/2ML IJ SOLN
INTRAMUSCULAR | Status: DC | PRN
  Administered 2012-06-04: 4 mg via INTRAVENOUS

## 2012-06-04 MED ORDER — EPINEPHRINE HCL 1 MG/ML IJ SOLN
INTRAMUSCULAR | Status: AC
  Filled 2012-06-04: qty 1

## 2012-06-04 MED ORDER — LISINOPRIL-HYDROCHLOROTHIAZIDE 20-12.5 MG PO TABS: 1.0000 | ORAL_TABLET | Freq: Every day | ORAL | Status: DC

## 2012-06-04 MED ORDER — ACETAMINOPHEN 325 MG PO TABS: 325.0000 mg | ORAL_TABLET | ORAL | Status: DC | PRN

## 2012-06-04 MED ORDER — MUPIROCIN 2 % EX OINT
TOPICAL_OINTMENT | Freq: Two times a day (BID) | CUTANEOUS | Status: DC
  Administered 2012-06-04: 1 via NASAL
  Filled 2012-06-04: qty 22

## 2012-06-04 MED ORDER — LIDOCAINE HCL (CARDIAC) 20 MG/ML IV SOLN
INTRAVENOUS | Status: DC | PRN
  Administered 2012-06-04: 100 mg via INTRAVENOUS

## 2012-06-04 MED ORDER — DEXAMETHASONE SODIUM PHOSPHATE 10 MG/ML IJ SOLN
INTRAMUSCULAR | Status: AC
  Filled 2012-06-04: qty 1

## 2012-06-04 MED ORDER — SODIUM CHLORIDE 0.45 % IV SOLN
INTRAVENOUS | Status: DC
  Administered 2012-06-04: 10 mL/h via INTRAVENOUS

## 2012-06-04 MED ORDER — ONDANSETRON HCL 4 MG/2ML IJ SOLN: 4.0000 mg | Freq: Four times a day (QID) | INTRAMUSCULAR | Status: DC | PRN

## 2012-06-04 MED ORDER — GATIFLOXACIN 0.5 % OP SOLN
1.0000 [drp] | Freq: Four times a day (QID) | OPHTHALMIC | Status: DC
  Filled 2012-06-04: qty 2.5

## 2012-06-04 MED ORDER — ONDANSETRON HCL 4 MG/2ML IJ SOLN
INTRAMUSCULAR | Status: AC
  Filled 2012-06-04: qty 2

## 2012-06-04 MED ORDER — MUPIROCIN 2 % EX OINT
TOPICAL_OINTMENT | CUTANEOUS | Status: AC
  Administered 2012-06-04: 1 via NASAL
  Filled 2012-06-04: qty 22

## 2012-06-04 MED ORDER — HYDROCHLOROTHIAZIDE 12.5 MG PO CAPS
12.5000 mg | ORAL_CAPSULE | Freq: Every day | ORAL | Status: DC
  Administered 2012-06-04: 12.5 mg via ORAL
  Filled 2012-06-04 (×2): qty 1

## 2012-06-04 MED ORDER — ASPIRIN EC 81 MG PO TBEC
81.0000 mg | DELAYED_RELEASE_TABLET | Freq: Every day | ORAL | Status: DC
  Filled 2012-06-04 (×2): qty 1

## 2012-06-04 MED ORDER — BRIMONIDINE TARTRATE 0.2 % OP SOLN
1.0000 [drp] | Freq: Two times a day (BID) | OPHTHALMIC | Status: DC
  Filled 2012-06-04: qty 5

## 2012-06-04 MED ORDER — BSS PLUS IO SOLN
INTRAOCULAR | Status: AC
  Filled 2012-06-04: qty 500

## 2012-06-04 MED ORDER — ATROPINE SULFATE 1 % OP SOLN
OPHTHALMIC | Status: AC
  Filled 2012-06-04: qty 2

## 2012-06-04 MED ORDER — VITAMIN D3 25 MCG (1000 UNIT) PO TABS
1000.0000 [IU] | ORAL_TABLET | Freq: Every day | ORAL | Status: DC
  Filled 2012-06-04: qty 1

## 2012-06-04 MED ORDER — ONDANSETRON HCL 4 MG/2ML IJ SOLN
4.0000 mg | Freq: Once | INTRAMUSCULAR | Status: AC | PRN
  Administered 2012-06-04: 4 mg via INTRAVENOUS

## 2012-06-04 MED ORDER — MAGNESIUM HYDROXIDE 400 MG/5ML PO SUSP: 15.0000 mL | Freq: Four times a day (QID) | ORAL | Status: DC | PRN

## 2012-06-04 MED ORDER — BSS IO SOLN
INTRAOCULAR | Status: AC
  Filled 2012-06-04: qty 15

## 2012-06-04 MED ORDER — SODIUM CHLORIDE 0.9 % IV SOLN
INTRAVENOUS | Status: DC
  Administered 2012-06-04 (×2): via INTRAVENOUS

## 2012-06-04 MED ORDER — TRIAMCINOLONE ACETONIDE 40 MG/ML IJ SUSP
INTRAMUSCULAR | Status: AC
  Filled 2012-06-04: qty 1

## 2012-06-04 MED ORDER — TROPICAMIDE 1 % OP SOLN
1.0000 [drp] | OPHTHALMIC | Status: AC | PRN
  Administered 2012-06-04 (×3): 1 [drp] via OPHTHALMIC
  Filled 2012-06-04: qty 3

## 2012-06-04 MED ORDER — BACITRACIN-POLYMYXIN B 500-10000 UNIT/GM OP OINT
TOPICAL_OINTMENT | OPHTHALMIC | Status: AC
  Filled 2012-06-04: qty 3.5

## 2012-06-04 MED ORDER — MORPHINE SULFATE 2 MG/ML IJ SOLN: 1.0000 mg | INTRAMUSCULAR | Status: DC | PRN

## 2012-06-04 MED ORDER — POLYMYXIN B SULFATE 500000 UNITS IJ SOLR
INTRAMUSCULAR | Status: AC
  Filled 2012-06-04: qty 1

## 2012-06-04 MED ORDER — TEMAZEPAM 15 MG PO CAPS: 15.0000 mg | ORAL_CAPSULE | Freq: Every evening | ORAL | Status: DC | PRN

## 2012-06-04 MED ORDER — LISINOPRIL 20 MG PO TABS
20.0000 mg | ORAL_TABLET | Freq: Every day | ORAL | Status: DC
  Filled 2012-06-04 (×2): qty 1

## 2012-06-04 MED ORDER — ROCURONIUM BROMIDE 100 MG/10ML IV SOLN
INTRAVENOUS | Status: DC | PRN
  Administered 2012-06-04: 15 mg via INTRAVENOUS
  Administered 2012-06-04: 35 mg via INTRAVENOUS

## 2012-06-04 MED ORDER — BSS IO SOLN
INTRAOCULAR | Status: DC | PRN
  Administered 2012-06-04: 15 mL via INTRAOCULAR

## 2012-06-04 MED ORDER — TETRACAINE HCL 0.5 % OP SOLN
2.0000 [drp] | Freq: Once | OPHTHALMIC | Status: DC
  Filled 2012-06-04: qty 2

## 2012-06-04 MED ORDER — ALPRAZOLAM 0.25 MG PO TABS: 0.2500 mg | ORAL_TABLET | Freq: Every evening | ORAL | Status: DC | PRN

## 2012-06-04 MED ORDER — SODIUM HYALURONATE 10 MG/ML IO SOLN
INTRAOCULAR | Status: AC
  Filled 2012-06-04: qty 0.85

## 2012-06-04 MED ORDER — SODIUM CHLORIDE 0.9 % IJ SOLN
INTRAMUSCULAR | Status: DC | PRN
  Administered 2012-06-04: 13:00:00

## 2012-06-04 MED ORDER — CYCLOPENTOLATE HCL 1 % OP SOLN
1.0000 [drp] | OPHTHALMIC | Status: AC | PRN
  Administered 2012-06-04 (×3): 1 [drp] via OPHTHALMIC
  Filled 2012-06-04: qty 2

## 2012-06-04 MED ORDER — LATANOPROST 0.005 % OP SOLN
1.0000 [drp] | Freq: Every day | OPHTHALMIC | Status: DC
  Filled 2012-06-04: qty 2.5

## 2012-06-04 MED ORDER — NEOSTIGMINE METHYLSULFATE 1 MG/ML IJ SOLN
INTRAMUSCULAR | Status: DC | PRN
  Administered 2012-06-04: 3 mg via INTRAVENOUS

## 2012-06-04 MED ORDER — ATROPINE SULFATE 1 % OP SOLN
OPHTHALMIC | Status: DC | PRN
  Administered 2012-06-04: 2 [drp] via OPHTHALMIC

## 2012-06-04 MED ORDER — ONDANSETRON HCL 4 MG/2ML IJ SOLN: 4.0000 mg | Freq: Once | INTRAMUSCULAR | Status: DC

## 2012-06-04 MED ORDER — SIMVASTATIN 20 MG PO TABS
20.0000 mg | ORAL_TABLET | Freq: Every day | ORAL | Status: DC
  Administered 2012-06-04: 20 mg via ORAL
  Filled 2012-06-04 (×2): qty 1

## 2012-06-04 MED ORDER — VALACYCLOVIR HCL 500 MG PO TABS
500.0000 mg | ORAL_TABLET | Freq: Two times a day (BID) | ORAL | Status: DC
  Administered 2012-06-04: 500 mg via ORAL
  Filled 2012-06-04 (×3): qty 1

## 2012-06-04 MED ORDER — BUPIVACAINE HCL 0.75 % IJ SOLN
INTRAMUSCULAR | Status: DC | PRN
  Administered 2012-06-04: 10 mL

## 2012-06-04 MED ORDER — GLYCOPYRROLATE 0.2 MG/ML IJ SOLN
INTRAMUSCULAR | Status: DC | PRN
  Administered 2012-06-04: 0.4 mg via INTRAVENOUS

## 2012-06-04 MED ORDER — BACITRACIN-POLYMYXIN B 500-10000 UNIT/GM OP OINT
1.0000 "application " | TOPICAL_OINTMENT | Freq: Four times a day (QID) | OPHTHALMIC | Status: DC
  Filled 2012-06-04: qty 3.5

## 2012-06-04 MED ORDER — ACETAZOLAMIDE SODIUM 500 MG IJ SOLR
500.0000 mg | Freq: Once | INTRAMUSCULAR | Status: AC
  Administered 2012-06-05: 500 mg via INTRAVENOUS
  Filled 2012-06-04: qty 500

## 2012-06-04 MED ORDER — BACITRACIN-POLYMYXIN B 500-10000 UNIT/GM OP OINT
TOPICAL_OINTMENT | OPHTHALMIC | Status: DC | PRN
  Administered 2012-06-04: 1 via OPHTHALMIC

## 2012-06-04 MED ORDER — GENTAMICIN SULFATE 40 MG/ML IJ SOLN
INTRAMUSCULAR | Status: AC
  Filled 2012-06-04: qty 2

## 2012-06-04 MED ORDER — DEXAMETHASONE SODIUM PHOSPHATE 10 MG/ML IJ SOLN
INTRAMUSCULAR | Status: DC | PRN
  Administered 2012-06-04: 10 mg

## 2012-06-04 SURGICAL SUPPLY — 83 items
APL SRG 3 HI ABS STRL LF PLS (MISCELLANEOUS) ×10
APPLICATOR DR MATTHEWS STRL (MISCELLANEOUS) ×18 IMPLANT
BALL CTTN LRG ABS STRL LF (GAUZE/BANDAGES/DRESSINGS) ×3
BAND CIRCLING RETINAL 2.5 (Ophthalmic Related) IMPLANT
BAND RTNL 125X2.5X.6 CRC (Ophthalmic Related) ×1 IMPLANT
BLADE EYE CATARACT 19 1.4 BEAV (BLADE) IMPLANT
BLADE MVR KNIFE 19G (BLADE) IMPLANT
BLADE SURG 15 STRL LF DISP TIS (BLADE) IMPLANT
BLADE SURG 15 STRL SS (BLADE)
CANNULA ANT CHAM MAIN (OPHTHALMIC RELATED) IMPLANT
CANNULA DUAL BORE 23G (CANNULA) IMPLANT
CORDS BIPOLAR (ELECTRODE) IMPLANT
COTTONBALL LRG STERILE PKG (GAUZE/BANDAGES/DRESSINGS) ×6 IMPLANT
COVER SURGICAL LIGHT HANDLE (MISCELLANEOUS) ×2 IMPLANT
DRAPE INCISE 51X51 W/FILM STRL (DRAPES) ×1 IMPLANT
DRAPE OPHTHALMIC 77X100 STRL (CUSTOM PROCEDURE TRAY) ×2 IMPLANT
ERASER HMR WETFIELD 23G BP (MISCELLANEOUS) IMPLANT
FILTER BLUE MILLIPORE (MISCELLANEOUS) ×2 IMPLANT
FILTER STRAW FLUID ASPIR (MISCELLANEOUS) IMPLANT
GAS OPHTHALMIC (MISCELLANEOUS) ×1 IMPLANT
GLOVE SS BIOGEL STRL SZ 6.5 (GLOVE) ×2 IMPLANT
GLOVE SS BIOGEL STRL SZ 7 (GLOVE) ×1 IMPLANT
GLOVE SUPERSENSE BIOGEL SZ 6.5 (GLOVE) ×1
GLOVE SUPERSENSE BIOGEL SZ 7 (GLOVE) ×1
GLOVE SURG 8.5 LATEX PF (GLOVE) ×3 IMPLANT
GLOVE SURG SS PI 6.5 STRL IVOR (GLOVE) ×1 IMPLANT
GLOVE SURG SS PI 7.0 STRL IVOR (GLOVE) ×1 IMPLANT
GOWN STRL NON-REIN LRG LVL3 (GOWN DISPOSABLE) ×6 IMPLANT
ILLUMINATOR CHOW PICK 25GA (MISCELLANEOUS) IMPLANT
IMPL SILICONE (Ophthalmic Related) ×1 IMPLANT
IMPLANT SILICONE (Ophthalmic Related) ×4 IMPLANT
KIT BASIN OR (CUSTOM PROCEDURE TRAY) ×2 IMPLANT
KIT PERFLUORON PROCEDURE 5ML (MISCELLANEOUS) IMPLANT
KIT ROOM TURNOVER OR (KITS) ×2 IMPLANT
KNIFE CRESCENT 1.75 EDGEAHEAD (BLADE) IMPLANT
KNIFE GRIESHABER SHARP 2.5MM (MISCELLANEOUS) ×7 IMPLANT
MARKER SKIN DUAL TIP RULER LAB (MISCELLANEOUS) IMPLANT
MASK EYE SHIELD (GAUZE/BANDAGES/DRESSINGS) ×1 IMPLANT
NDL 18GX1X1/2 (RX/OR ONLY) (NEEDLE) ×1 IMPLANT
NDL 25GX 5/8IN NON SAFETY (NEEDLE) IMPLANT
NDL HYPO 30X.5 LL (NEEDLE) ×2 IMPLANT
NEEDLE 18GX1X1/2 (RX/OR ONLY) (NEEDLE) ×2 IMPLANT
NEEDLE 25GX 5/8IN NON SAFETY (NEEDLE) IMPLANT
NEEDLE 27GAX1X1/2 (NEEDLE) IMPLANT
NEEDLE HYPO 30X.5 LL (NEEDLE) ×4 IMPLANT
NS IRRIG 1000ML POUR BTL (IV SOLUTION) ×2 IMPLANT
PACK VITRECTOMY CUSTOM (CUSTOM PROCEDURE TRAY) ×2 IMPLANT
PAD ARMBOARD 7.5X6 YLW CONV (MISCELLANEOUS) ×4 IMPLANT
PAD EYE OVAL STERILE LF (GAUZE/BANDAGES/DRESSINGS) ×1 IMPLANT
PAK VITRECTOMY PIK 25 GA (OPHTHALMIC RELATED) IMPLANT
PROBE DIRECTIONAL LASER (MISCELLANEOUS) IMPLANT
REPL STRA BRUSH NDL (NEEDLE) IMPLANT
REPL STRA BRUSH NEEDLE (NEEDLE) IMPLANT
RESERVOIR BACK FLUSH (MISCELLANEOUS) IMPLANT
ROLLS DENTAL (MISCELLANEOUS) ×4 IMPLANT
SET FLUID INJECTOR (SET/KITS/TRAYS/PACK) IMPLANT
SET VGFI TUBING 8065808002 (SET/KITS/TRAYS/PACK) IMPLANT
SLEEVE SCLERAL TYPE 270 (Ophthalmic Related) ×1 IMPLANT
SPEAR EYE SURG WECK-CEL (MISCELLANEOUS) ×6 IMPLANT
SPONGE GROOVED SILICONE 4X12X8 (Ophthalmic Related) ×1 IMPLANT
SPONGE SURGIFOAM ABS GEL 12-7 (HEMOSTASIS) IMPLANT
STOPCOCK 4 WAY LG BORE MALE ST (IV SETS) IMPLANT
SUT CHROMIC 7 0 TG140 8 (SUTURE) ×2 IMPLANT
SUT ETHILON 9 0 TG140 8 (SUTURE) IMPLANT
SUT MERSILENE 4 0 RV 2 (SUTURE) ×4 IMPLANT
SUT SILK 2 0 (SUTURE) ×2
SUT SILK 2-0 18XBRD TIE 12 (SUTURE) ×1 IMPLANT
SUT SILK 4 0 RB 1 (SUTURE) ×2 IMPLANT
SUT VICRYL 7 0 TG140 8 (SUTURE) IMPLANT
SYR 20CC LL (SYRINGE) ×2 IMPLANT
SYR 50ML LL SCALE MARK (SYRINGE) IMPLANT
SYR 5ML LL (SYRINGE) ×1 IMPLANT
SYR BULB 3OZ (MISCELLANEOUS) ×2 IMPLANT
SYR TB 1ML LUER SLIP (SYRINGE) IMPLANT
SYRINGE 10CC LL (SYRINGE) ×2 IMPLANT
TAPE SURG TRANSPORE 1 IN (GAUZE/BANDAGES/DRESSINGS) IMPLANT
TAPE SURGICAL TRANSPORE 1 IN (GAUZE/BANDAGES/DRESSINGS) ×1
TIRE RTNL 2.5XGRV CNCV 9X (Ophthalmic Related) IMPLANT
TOWEL OR 17X24 6PK STRL BLUE (TOWEL DISPOSABLE) ×5 IMPLANT
TUBING ART PRESS 12 MALE/MALE (MISCELLANEOUS) IMPLANT
VITREORETINAL VISCODISSEC (MISCELLANEOUS) IMPLANT
WATER STERILE IRR 1000ML POUR (IV SOLUTION) ×2 IMPLANT
WIPE INSTRUMENT VISIWIPE 73X73 (MISCELLANEOUS) ×2 IMPLANT

## 2012-06-04 NOTE — OR Nursing (Signed)
C3F8 gas bubble alert bracelet attached to patient's right wrist. [facility] patient information card attached to chart.

## 2012-06-04 NOTE — Transfer of Care (Signed)
Immediate Anesthesia Transfer of Care Note  Patient: Jesse [name]  Procedure(s) Performed: Procedure(s) (LRB) with comments: SCLERAL BUCKLE (Left) INSERTION OF GAS (Left) - C3F8  Patient Location: PACU  Anesthesia Type: General  Level of Consciousness: awake, alert , oriented and patient cooperative  Airway & Oxygen Therapy: Patient Spontanous Breathing and Patient connected to nasal cannula oxygen  Post-op Assessment: Report given to PACU RN, Post -op Vital signs reviewed and stable and Patient moving all extremities X 4  Post vital signs: Reviewed and stable  Complications: No apparent anesthesia complications

## 2012-06-04 NOTE — Preoperative (Signed)
Beta Blockers   Reason not to administer Beta Blockers:Not Applicable 

## 2012-06-04 NOTE — Anesthesia Preprocedure Evaluation (Addendum)
Anesthesia Evaluation  Patient identified by MRN, date of birth, ID band Patient awake    Reviewed: Allergy & Precautions, H&P , NPO status , Patient's Chart, lab work & pertinent test results  Airway Mallampati: III TM Distance: >3 FB Neck ROM: full    Dental   Pulmonary          Cardiovascular hypertension, Rhythm:regular Rate:Normal     Neuro/Psych    GI/Hepatic   Endo/Other    Renal/GU      Musculoskeletal   Abdominal   Peds  Hematology   Anesthesia Other Findings   Reproductive/Obstetrics                          Anesthesia Physical Anesthesia Plan  ASA: II  Anesthesia Plan: General   Post-op Pain Management:    Induction: Intravenous  Airway Management Planned: Oral ETT  Additional Equipment:   Intra-op Plan:   Post-operative Plan: Extubation in OR  Informed Consent: I have reviewed the patients History and Physical, chart, labs and discussed the procedure including the risks, benefits and alternatives for the proposed anesthesia with the patient or authorized representative who has indicated his/her understanding and acceptance.   Dental advisory given  Plan Discussed with: CRNA, Anesthesiologist and Surgeon  Anesthesia Plan Comments:        Anesthesia Quick Evaluation

## 2012-06-04 NOTE — H&P (Signed)
I examined the patient today and there is no change in the medical status 

## 2012-06-04 NOTE — Brief Op Note (Signed)
Brief Operative note   Preoperative diagnosis:  Pre-Op Diagnosis Codes:    * Retinal detachment with retinal defect, unspecified [361.00] Postoperative diagnosis  Post-Op Diagnosis Codes:    * Retinal detachment with retinal defect, unspecified [361.00]  Procedures: Scleral buckle laser and gas injection left eye  Surgeon:  Sherrie George, MD...  Assistant:  Rosalie Doctor SA    Anesthesia: General  Specimen: none  Estimated blood loss:  1cc  Complications: none  Patient sent to PACU in good condition  Composed by Sherrie George MD  Dictation number: [id number]

## 2012-06-04 NOTE — Anesthesia Postprocedure Evaluation (Signed)
  Anesthesia Post-op Note  Patient: Jesse [name]  Procedure(s) Performed: Procedure(s) (LRB) with comments: SCLERAL BUCKLE (Left) - Headscope laser INSERTION OF GAS (Left) - C3F8  Patient Location: PACU  Anesthesia Type: General  Level of Consciousness: awake, oriented, sedated and patient cooperative  Airway and Oxygen Therapy: Patient Spontanous Breathing  Post-op Pain: none  Post-op Assessment: Post-op Vital signs reviewed, Patient's Cardiovascular Status Stable, Respiratory Function Stable, Patent Airway, No signs of Nausea or vomiting and Pain level controlled  Post-op Vital Signs: stable  Complications: No apparent anesthesia complications

## 2012-06-05 MED ORDER — PREDNISOLONE ACETATE 1 % OP SUSP: 1.0000 [drp] | Freq: Four times a day (QID) | OPHTHALMIC | Status: AC

## 2012-06-05 MED ORDER — GATIFLOXACIN 0.5 % OP SOLN: 1.0000 [drp] | Freq: Four times a day (QID) | OPHTHALMIC | Status: DC

## 2012-06-05 MED ORDER — BACITRACIN-POLYMYXIN B 500-10000 UNIT/GM OP OINT: 1.0000 "application " | TOPICAL_OINTMENT | Freq: Four times a day (QID) | OPHTHALMIC | Status: AC

## 2012-06-05 NOTE — Progress Notes (Signed)
Patient discharged to home with sister.  Discharge teaching completed including follow up care, medications, and eye care.  Patient verbalizes understanding with no further questions.  VSS, no complaints of pain.

## 2012-06-05 NOTE — Op Note (Signed)
NAMEJARRAH, Jesse [name]                 ACCOUNT NO.:  [id number]  MEDICAL RECORD NO.:  [id number]  LOCATION:  [id number]                        FACILITY:  [facility]  PHYSICIAN:  Beulah Gandy. Ashley Royalty, M.D. DATE OF BIRTH:  August 14, 1941  DATE OF PROCEDURE:  06/04/2012 DATE OF DISCHARGE:                              OPERATIVE REPORT   ADMISSION DIAGNOSIS:  Rhegmatogenous retinal detachment, left eye.  PROCEDURE:  Scleral buckle, left eye, gas fluid exchange, left eye, retinal photocoagulation, left eye.  SURGEON:  Beulah Gandy. Ashley Royalty, M.D.  ASSISTANT:  Rosalie Doctor, SA.  ANESTHESIA:  General.  DETAILS:  After usual prep and drape, 360-degree limbal peritomy, isolation of 4 rectus muscles on 2-0 silk.  Scleral dissection for 360 degrees to admit a #279 intrascleral implant.  Diathermy placed in the bed.  Additional posterior dissection carried out at 2 o'clock to admit a #508G radial segment beneath the retinal break.  Perforation site chosen at 3 o'clock in the posterior aspect of the bed.  Large amount of clear colorless subretinal fluid came forth in a controlled manner.  The fluid was thin.  The buckle elements were placed and 2 sutures per quadrant were placed in the scleral flaps.  The 279 implant was placed around the globe with the joint at 10 o'clock, and the 240 band was placed around the globe with a 270 sleeve at 10 o'clock.  The scleral flaps were closed over the buckle elements as the fluid egressed, 1 mL of 50% C3F8 perfluoropropane was injected through the 2 o'clock pars plana to reinflate the globe.  The buckle was adjusted and trimmed.  The band was adjusted and trimmed.  Indirect ophthalmoscopy showed the retina to be lying nicely in place with the break well supported. Indirect ophthalmoscope laser was moved into place, 840 burns were placed around the retinal periphery and around the retinal break.  The power was 460 mW, 1000 microns each and 0.1 seconds each.  The scleral sutures  were knotted and the free ends removed.  The conjunctiva was reposited with 7-0 chromic suture.  Polymyxin and gentamicin were irrigated into tenon space.  Atropine solution was applied.  Marcaine was injected around the globe for postop pain.  Decadron 10 mg was injected into the lower subconjunctival space.  Polysporin ophthalmic ointment, a patch and shield were placed.  Closing pressure was 10 with a Risk manager.  COMPLICATIONS:  None.  DURATION:  2 hours.  The patient was awakened and taken to recovery in satisfactory condition.     Beulah Gandy. Ashley Royalty, M.D.     JDM/MEDQ  D:  06/04/2012  T:  06/05/2012  Job:  [id number]

## 2012-06-05 NOTE — Progress Notes (Signed)
06/05/2012, 6:45 AM  Mental Status:  Awake, Alert, Oriented  Anterior segment: Cornea  Clear    Anterior Chamber Clear    Lens:   Cataract  Intra Ocular Pressure 18 mmHg with Tonopen  Vitreous: Clear 60%gas bubble  Retina:  Attached Good laser reaction   Impression: Excellent result Retina attached   Final Diagnosis: Active Problems:  *Rhegmatogenous retinal detachment   Plan: start post operative eye drops.  Discharge to home.  Give post operative instructions  Sherrie George 06/05/2012, 6:45 AM

## 2012-06-08 ENCOUNTER — Encounter (HOSPITAL_COMMUNITY): Payer: Self-pay | Admitting: Ophthalmology

## 2012-06-11 ENCOUNTER — Inpatient Hospital Stay (INDEPENDENT_AMBULATORY_CARE_PROVIDER_SITE_OTHER): Payer: Medicare Other | Admitting: Ophthalmology

## 2012-06-11 DIAGNOSIS — H33009 Unspecified retinal detachment with retinal break, unspecified eye: Secondary | ICD-10-CM

## 2012-06-17 ENCOUNTER — Ambulatory Visit (HOSPITAL_BASED_OUTPATIENT_CLINIC_OR_DEPARTMENT_OTHER): Payer: Medicare Other | Admitting: Hematology & Oncology

## 2012-06-17 ENCOUNTER — Other Ambulatory Visit (HOSPITAL_BASED_OUTPATIENT_CLINIC_OR_DEPARTMENT_OTHER): Payer: Medicare Other | Admitting: Lab

## 2012-06-17 ENCOUNTER — Ambulatory Visit (HOSPITAL_BASED_OUTPATIENT_CLINIC_OR_DEPARTMENT_OTHER): Payer: Medicare Other

## 2012-06-17 VITALS — BP 112/78 | HR 63 | Resp 16

## 2012-06-17 VITALS — BP 128/79 | HR 82 | Temp 98.3°F | Resp 20 | Ht 65.0 in | Wt 166.0 lb

## 2012-06-17 DIAGNOSIS — D45 Polycythemia vera: Secondary | ICD-10-CM

## 2012-06-17 LAB — CBC WITH DIFFERENTIAL (CANCER CENTER ONLY)
BASO#: 0 10*3/uL (ref 0.0–0.2)
BASO%: 0.2 % (ref 0.0–2.0)
EOS%: 0.4 % (ref 0.0–7.0)
Eosinophils Absolute: 0 10*3/uL (ref 0.0–0.5)
HCT: 47.8 % (ref 38.7–49.9)
HGB: 16.5 g/dL (ref 13.0–17.1)
LYMPH#: 2 10*3/uL (ref 0.9–3.3)
LYMPH%: 21.2 % (ref 14.0–48.0)
MCH: 29.3 pg (ref 28.0–33.4)
MCHC: 34.5 g/dL (ref 32.0–35.9)
MCV: 85 fL (ref 82–98)
MONO#: 1.1 10*3/uL — ABNORMAL HIGH (ref 0.1–0.9)
MONO%: 12 % (ref 0.0–13.0)
NEUT#: 6.2 10*3/uL (ref 1.5–6.5)
NEUT%: 66.2 % (ref 40.0–80.0)
Platelets: 254 10*3/uL (ref 145–400)
RBC: 5.63 10*6/uL (ref 4.20–5.70)
RDW: 18 % — ABNORMAL HIGH (ref 11.1–15.7)
WBC: 9.3 10*3/uL (ref 4.0–10.0)

## 2012-06-17 NOTE — Progress Notes (Signed)
This office note has been dictated.

## 2012-06-17 NOTE — Patient Instructions (Signed)
Take aspirin

## 2012-06-17 NOTE — Progress Notes (Signed)
Fran Lowes presents today for phlebotomy per MD orders. Phlebotomy procedure started at 1100 and ended at 1120. 500 grams removed. Patient observed for 30 minutes after procedure without any incident. Patient tolerated procedure well. IV needle removed intact.

## 2012-06-18 NOTE — Progress Notes (Signed)
CC:   [name]. [name], M.D.  DIAGNOSIS:  Polycythemia vera, JAK2 negative.  CURRENT THERAPY: 1. Phlebotomy to maintain hematocrit less than 45%. 2. Aspirin 81 mg p.o. daily.  INTERIM HISTORY:  Jesse [name] comes in for his followup.  He had retinal surgery on his left eye,  I think a week or so ago.  He is doing well with this.  He does have a "gas bubble" in the eye, so he really cannot lie down on his back yet.  Otherwise, he has been doing well.  We last saw him back in May.  He was phlebotomized back then.  He is taking aspirin without any problems.  There is no bleeding.  There is no change in bowel or bladder habits.  He has had no rashes.  There has been no headache.  He did have some post herpetic neuralgia in the left C6 dermatome, which continues to resolve.  PHYSICAL EXAMINATION:  This is a well-developed, well-nourished white gentleman in no obvious distress.  Vital Signs:  98.3, pulse 82, respiratory rate 20, blood pressure 120/79.  Weight is 166.  Head and neck:  Normocephalic, atraumatic skull.  There are no ocular or oral lesions.  There are no palpable cervical or supraclavicular lymph nodes. Lungs:  Clear bilaterally.  Cardiac:  Regular rate and rhythm with a normal S1, S2.  There are no murmurs, rubs or bruits.  Abdomen:  Soft with good bowel sounds.  He has no fluid wave.  There is no palpable abdominal mass.  There is no palpable hepatosplenomegaly. Back:  No tenderness of the spine, ribs, or hips.  Extremities:  No clubbing, cyanosis or edema.  Ocular exam does show some slight ptosis and some inflammation of the left cornea.  His pupils react okay.  LABORATORY STUDIES:  White cell count 9.3, hemoglobin 16.5, hematocrit 47.8, platelet count 254.  IMPRESSION:  Jesse [name] is a 71 year old gentleman with polycythemia vera.  We will go ahead and phlebotomize him today.  He actually has done quite well.  We will plan to get him back in another 3 months for  followup.  I do not see that we need any blood work in between visits.   ______________________________ [name], M.D. PRE/MEDQ  D:  06/17/2012  T:  06/18/2012  Job:  [id number]

## 2012-07-02 ENCOUNTER — Encounter (INDEPENDENT_AMBULATORY_CARE_PROVIDER_SITE_OTHER): Payer: Medicare Other | Admitting: Ophthalmology

## 2012-07-02 DIAGNOSIS — H33009 Unspecified retinal detachment with retinal break, unspecified eye: Secondary | ICD-10-CM

## 2012-09-09 ENCOUNTER — Encounter (INDEPENDENT_AMBULATORY_CARE_PROVIDER_SITE_OTHER): Payer: Medicare Other | Admitting: Ophthalmology

## 2012-09-09 DIAGNOSIS — H33309 Unspecified retinal break, unspecified eye: Secondary | ICD-10-CM

## 2012-09-09 DIAGNOSIS — H34219 Partial retinal artery occlusion, unspecified eye: Secondary | ICD-10-CM

## 2012-09-09 DIAGNOSIS — H2510 Age-related nuclear cataract, unspecified eye: Secondary | ICD-10-CM

## 2012-09-09 DIAGNOSIS — H33009 Unspecified retinal detachment with retinal break, unspecified eye: Secondary | ICD-10-CM

## 2012-09-09 DIAGNOSIS — H43819 Vitreous degeneration, unspecified eye: Secondary | ICD-10-CM

## 2012-10-19 ENCOUNTER — Ambulatory Visit: Payer: Medicare Other | Admitting: Hematology & Oncology

## 2012-10-19 ENCOUNTER — Telehealth: Payer: Self-pay | Admitting: Hematology & Oncology

## 2012-10-19 ENCOUNTER — Other Ambulatory Visit: Payer: Medicare Other | Admitting: Lab

## 2012-10-19 NOTE — Telephone Encounter (Signed)
Pt made 1-30 appointment from missed 1-27. He is aware he will have to wait until 1030 for phlebotomy.

## 2012-10-22 ENCOUNTER — Ambulatory Visit (HOSPITAL_BASED_OUTPATIENT_CLINIC_OR_DEPARTMENT_OTHER): Payer: Medicare Other | Admitting: Hematology & Oncology

## 2012-10-22 ENCOUNTER — Other Ambulatory Visit (HOSPITAL_BASED_OUTPATIENT_CLINIC_OR_DEPARTMENT_OTHER): Payer: Medicare Other | Admitting: Lab

## 2012-10-22 ENCOUNTER — Ambulatory Visit (HOSPITAL_BASED_OUTPATIENT_CLINIC_OR_DEPARTMENT_OTHER): Payer: Medicare Other

## 2012-10-22 VITALS — BP 114/79 | HR 90 | Resp 16

## 2012-10-22 VITALS — BP 137/74 | HR 89 | Temp 97.7°F | Resp 18 | Ht 65.0 in | Wt 176.0 lb

## 2012-10-22 DIAGNOSIS — D45 Polycythemia vera: Secondary | ICD-10-CM

## 2012-10-22 LAB — CBC WITH DIFFERENTIAL (CANCER CENTER ONLY)
BASO#: 0 10*3/uL (ref 0.0–0.2)
BASO%: 0.3 % (ref 0.0–2.0)
EOS%: 1.1 % (ref 0.0–7.0)
Eosinophils Absolute: 0.1 10*3/uL (ref 0.0–0.5)
HCT: 50.3 % — ABNORMAL HIGH (ref 38.7–49.9)
HGB: 17.1 g/dL (ref 13.0–17.1)
LYMPH#: 2.3 10*3/uL (ref 0.9–3.3)
LYMPH%: 30.5 % (ref 14.0–48.0)
MCH: 31 pg (ref 28.0–33.4)
MCHC: 34 g/dL (ref 32.0–35.9)
MCV: 91 fL (ref 82–98)
MONO#: 1.3 10*3/uL — ABNORMAL HIGH (ref 0.1–0.9)
MONO%: 16.9 % — ABNORMAL HIGH (ref 0.0–13.0)
NEUT#: 3.9 10*3/uL (ref 1.5–6.5)
NEUT%: 51.2 % (ref 40.0–80.0)
Platelets: 230 10*3/uL (ref 145–400)
RBC: 5.51 10*6/uL (ref 4.20–5.70)
RDW: 14.6 % (ref 11.1–15.7)
WBC: 7.5 10*3/uL (ref 4.0–10.0)

## 2012-10-22 NOTE — Progress Notes (Signed)
This office note has been dictated.

## 2012-10-22 NOTE — Progress Notes (Deleted)
Fran Lowes presents today for phlebotomy per MD orders. Phlebotomy procedure started at 1030 and ended at 1040. 500 ml removed. Patient observed for 30 minutes after procedure without any incident. Patient tolerated procedure well. IV needle removed intact.

## 2012-10-22 NOTE — Progress Notes (Signed)
Fran Lowes presents today for phlebotomy per MD orders. Phlebotomy procedure started at 0925 and ended at 0935. 500 mls removed. Patient observed for 30 minutes after procedure without any incident. Patient tolerated procedure well. IV needle removed intact.

## 2012-10-23 NOTE — Progress Notes (Signed)
CC:   Jesse [name]. Jesse [name], M.D.  DIAGNOSIS:  Polycythemia vera, JAK2 negative.  CURRENT THERAPY: 1. Phlebotomy to maintain hematocrit less than 45%. 2. Aspirin 81 mg p.o. daily.  INTERIM HISTORY:  Jesse [name] comes in for his followup.  He is doing better.  When we last saw him back in September he had retinal surgery of left eye for retinal detachment.  He is getting his vision back.  He has a cataract that may need to be taken care of.  He has probably not been phlebotomized since September.  At that point his hematocrit was 47.8%.  He has had no fevers, sweats or chills.  He has had no rashes.  He has had no pruritus.  He has had no headache.  He enjoyed the holidays.  He did not complain of any neuralgia or pain in the left C6 dermatome.  PHYSICAL EXAMINATION:  General:  This is a well-developed, well- nourished white gentleman in no obvious distress.  Vital signs:  Show a temperature of 97.7, pulse 89, respiratory rate 18, blood pressure 137/74.  Weight is 176.  Head and neck:  Shows a normocephalic, atraumatic skull.  There are no ocular or oral lesions.  There are no palpable cervical or supraclavicular lymph nodes.  Lungs:  Clear bilaterally.  Cardiac:  Regular rate and rhythm with a normal S1 and S2. There are no murmurs, rubs or bruits.  Abdomen:  Soft with good bowel sounds.  There is no palpable abdominal mass.  There is no palpable hepatosplenomegaly.  Back:  No tenderness over the spine, ribs or hips. Extremities:  Show no clubbing, cyanosis or edema.  Neurological:  Shows no focal neurological deficits.  LABORATORY STUDIES:  White cell count is 7.5, hemoglobin 17.1, hematocrit 50.3, platelet count 230.  IMPRESSION:  Jesse [name] is a 72 year old gentleman with polycythemia.  We will go ahead and phlebotomize him today.  I will also set him for a phlebotomy next week.  We will then plan to get him back in about 2 months' time.  I think we need to be a little bit  more aggressive in maintaining his hematocrit below 45%.   ______________________________ Jesse [name], M.D. PRE/MEDQ  D:  10/22/2012  T:  10/23/2012  Job:  [id number]

## 2012-10-29 ENCOUNTER — Ambulatory Visit (HOSPITAL_BASED_OUTPATIENT_CLINIC_OR_DEPARTMENT_OTHER): Payer: Medicare Other

## 2012-10-29 VITALS — BP 102/74 | HR 92 | Temp 97.0°F | Resp 20

## 2012-10-29 DIAGNOSIS — D45 Polycythemia vera: Secondary | ICD-10-CM

## 2012-10-29 NOTE — Patient Instructions (Signed)

## 2012-10-29 NOTE — Progress Notes (Signed)
Jesse [name] presents today for phlebotomy per MD orders. Phlebotomy procedure started at 0930 and ended at 0940. 500 grams removed. Patient observed for 30 minutes after procedure without any incident. Patient tolerated procedure well. IV needle removed intact.   

## 2012-12-21 ENCOUNTER — Ambulatory Visit (HOSPITAL_BASED_OUTPATIENT_CLINIC_OR_DEPARTMENT_OTHER): Payer: Medicare Other

## 2012-12-21 ENCOUNTER — Other Ambulatory Visit (HOSPITAL_BASED_OUTPATIENT_CLINIC_OR_DEPARTMENT_OTHER): Payer: Medicare Other | Admitting: Lab

## 2012-12-21 ENCOUNTER — Ambulatory Visit (HOSPITAL_BASED_OUTPATIENT_CLINIC_OR_DEPARTMENT_OTHER): Payer: Medicare Other | Admitting: Medical

## 2012-12-21 VITALS — BP 128/80 | HR 88 | Temp 98.6°F | Resp 18 | Ht 65.0 in | Wt 172.0 lb

## 2012-12-21 VITALS — BP 117/85 | HR 94 | Resp 16

## 2012-12-21 DIAGNOSIS — D45 Polycythemia vera: Secondary | ICD-10-CM

## 2012-12-21 LAB — CBC WITH DIFFERENTIAL (CANCER CENTER ONLY)
BASO#: 0 10*3/uL (ref 0.0–0.2)
BASO%: 0.3 % (ref 0.0–2.0)
EOS%: 0.9 % (ref 0.0–7.0)
Eosinophils Absolute: 0.1 10*3/uL (ref 0.0–0.5)
HCT: 49.3 % (ref 38.7–49.9)
HGB: 16.8 g/dL (ref 13.0–17.1)
LYMPH#: 1.9 10*3/uL (ref 0.9–3.3)
LYMPH%: 27.2 % (ref 14.0–48.0)
MCH: 30.3 pg (ref 28.0–33.4)
MCHC: 34.1 g/dL (ref 32.0–35.9)
MCV: 89 fL (ref 82–98)
MONO#: 1.1 10*3/uL — ABNORMAL HIGH (ref 0.1–0.9)
MONO%: 16 % — ABNORMAL HIGH (ref 0.0–13.0)
NEUT#: 3.9 10*3/uL (ref 1.5–6.5)
NEUT%: 55.6 % (ref 40.0–80.0)
Platelets: 235 10*3/uL (ref 145–400)
RBC: 5.55 10*6/uL (ref 4.20–5.70)
RDW: 14.1 % (ref 11.1–15.7)
WBC: 6.9 10*3/uL (ref 4.0–10.0)

## 2012-12-21 LAB — FERRITIN: Ferritin: 16 ng/mL — ABNORMAL LOW (ref 22–322)

## 2012-12-21 NOTE — Progress Notes (Signed)
DIAGNOSIS:  Polycythemia vera, JAK2 negative.  CURRENT THERAPY: 1. Phlebotomy to maintain hematocrit less than 45%. 2. Aspirin 81 mg p.o. daily.  INTERIM HISTORY: Jesse [name] presents today for an office followup visit.  Overall, he, reports, that he's doing relatively well.  He's not reported any new problems.  He states he does have a cataract, left eye, that needs to be taking care of.  He was last phlebotomized in 2 months ago.  We like to keep his hematocrit below 45%.  His hematocrit is almost 50.  Today.  We will go ahead and phlebotomize him.  Today as well as next week.  He, reports, a good appetite.  He denies any nausea, vomiting, diarrhea, constipation, chest pain, shortness of breath, fevers, chills, or night sweats.  He denies any type of abdominal pain, any lower leg swelling.  He denies any obvious, or abnormal bleeding.  He denies any headaches, visual changes, or rashes.  He denies any pruritus.   Review of Systems: Constitutional:Negative for malaise/fatigue, fever, chills, weight loss, diaphoresis, activity change, appetite change, and unexpected weight change.  HEENT: Negative for double vision, blurred vision, visual loss, ear pain, tinnitus, congestion, rhinorrhea, epistaxis sore throat or sinus disease, oral pain/lesion, tongue soreness Respiratory: Negative for cough, chest tightness, shortness of breath, wheezing and stridor.  Cardiovascular: Negative for chest pain, palpitations, leg swelling, orthopnea, PND, DOE or claudication Gastrointestinal: Negative for nausea, vomiting, abdominal pain, diarrhea, constipation, blood in stool, melena, hematochezia, abdominal distention, anal bleeding, rectal pain, anorexia and hematemesis.  Genitourinary: Negative for dysuria, frequency, hematuria,  Musculoskeletal: Negative for myalgias, back pain, joint swelling, arthralgias and gait problem.  Skin: Negative for rash, color change, pallor and wound.  Neurological:. Negative for  dizziness/light-headedness, tremors, seizures, syncope, facial asymmetry, speech difficulty, weakness, numbness, headaches and paresthesias.  Hematological: Negative for adenopathy. Does not bruise/bleed easily.  Psychiatric/Behavioral:  Negative for depression, no loss of interest in normal activity or change in sleep pattern.   Physical Exam: This is a 72 year old, well-developed, well-nourished, white gentleman, in no obvious distress Vitals: Temperature 98.6 degrees, pulse 88, respirations 18, blood pressure 128/80.  Weight 172 pounds` HEENT reveals a normocephalic, atraumatic skull, no scleral icterus, no oral lesions  Neck is supple without any cervical or supraclavicular adenopathy.  Lungs are clear to auscultation bilaterally. There are no wheezes, rales or rhonci Cardiac is regular rate and rhythm with a normal S1 and S2. There are no murmurs, rubs, or bruits.  Abdomen is soft with good bowel sounds, there is no palpable mass. There is no palpable hepatosplenomegaly. There is no palpable fluid wave.  Musculoskeletal no tenderness of the spine, ribs, or hips.  Extremities there are no clubbing, cyanosis, or edema.  Skin no petechia, purpura or ecchymosis Neurologic is nonfocal.  Laboratory Data: White count 6.9, hemoglobin 16.8, hematocrit 49.3, platelets 235,000  Current Outpatient Prescriptions on File Prior to Visit  Medication Sig Dispense Refill  . ALPRAZolam (XANAX) 0.5 MG tablet Take 0.25 mg by mouth at bedtime as needed. For sleep      . aspirin EC 81 MG tablet Take 81 mg by mouth daily.      . cholecalciferol (VITAMIN D) 1000 UNITS tablet Take 1,000 Units by mouth daily.        . fish oil-omega-3 fatty acids 1000 MG capsule Take 1 g by mouth daily.       Marland Kitchen lisinopril-hydrochlorothiazide (PRINZIDE,ZESTORETIC) 20-12.5 MG per tablet Take 1 tablet by mouth daily.        Marland Kitchen  Polyethyl Glycol-Propyl Glycol (SYSTANE) 0.4-0.3 % SOLN Place 1 drop into both eyes 2 (two) times daily.       . simvastatin (ZOCOR) 20 MG tablet Take 20 mg by mouth at bedtime.        . valACYclovir (VALTREX) 500 MG tablet Take 500 mg by mouth 2 (two) times daily.       No current facility-administered medications on file prior to visit.   Assessment/Plan: This is a pleasant, 72 year old, white gentleman, with the following issues:  #1.  Polycythemia. He is JAK2 negative.  Again, we like to keep his hematocrit below 45%.  We are being aggressive with his phlebotomies.  He will go ahead and receive a phlebotomy today and we will plan number in him back next week for another phlebotomy.  #2.  Followup.  We will follow back up with Jesse [name] in 2 months, but before then should there be questions or concerns.

## 2012-12-21 NOTE — Progress Notes (Signed)
Jesse [name] presents today for phlebotomy per MD orders. Phlebotomy procedure started at 1040 and ended at 1100. 500 ml removed. Patient observed for 30 minutes after procedure without any incident. Patient tolerated procedure well. IV needle removed intact.

## 2012-12-21 NOTE — Patient Instructions (Addendum)

## 2012-12-28 ENCOUNTER — Ambulatory Visit (HOSPITAL_BASED_OUTPATIENT_CLINIC_OR_DEPARTMENT_OTHER): Payer: Medicare Other

## 2012-12-28 VITALS — BP 116/75 | HR 101 | Temp 96.7°F | Resp 20

## 2012-12-28 DIAGNOSIS — D45 Polycythemia vera: Secondary | ICD-10-CM

## 2012-12-28 NOTE — Progress Notes (Signed)
Jesse [name] presents today for phlebotomy per MD orders. Phlebotomy procedure started at 1235 and ended at 1248. Approximately 500 mls removed. Patient observed for 30 minutes after procedure without any incident. Patient tolerated procedure well. IV needle removed intact.

## 2012-12-28 NOTE — Patient Instructions (Signed)

## 2013-02-22 ENCOUNTER — Ambulatory Visit (HOSPITAL_BASED_OUTPATIENT_CLINIC_OR_DEPARTMENT_OTHER): Payer: Medicare Other | Admitting: Hematology & Oncology

## 2013-02-22 ENCOUNTER — Other Ambulatory Visit (HOSPITAL_BASED_OUTPATIENT_CLINIC_OR_DEPARTMENT_OTHER): Payer: Medicare Other | Admitting: Lab

## 2013-02-22 ENCOUNTER — Ambulatory Visit (HOSPITAL_BASED_OUTPATIENT_CLINIC_OR_DEPARTMENT_OTHER): Payer: Medicare Other

## 2013-02-22 VITALS — BP 123/84 | HR 82 | Temp 97.4°F | Resp 18 | Ht 65.0 in | Wt 169.0 lb

## 2013-02-22 DIAGNOSIS — D45 Polycythemia vera: Secondary | ICD-10-CM

## 2013-02-22 LAB — CBC WITH DIFFERENTIAL (CANCER CENTER ONLY)
BASO#: 0 10*3/uL (ref 0.0–0.2)
BASO%: 0.4 % (ref 0.0–2.0)
EOS%: 0.8 % (ref 0.0–7.0)
Eosinophils Absolute: 0.1 10*3/uL (ref 0.0–0.5)
HCT: 47.3 % (ref 38.7–49.9)
HGB: 15.6 g/dL (ref 13.0–17.1)
LYMPH#: 2 10*3/uL (ref 0.9–3.3)
LYMPH%: 24 % (ref 14.0–48.0)
MCH: 28.4 pg (ref 28.0–33.4)
MCHC: 33 g/dL (ref 32.0–35.9)
MCV: 86 fL (ref 82–98)
MONO#: 1.1 10*3/uL — ABNORMAL HIGH (ref 0.1–0.9)
MONO%: 13.3 % — ABNORMAL HIGH (ref 0.0–13.0)
NEUT#: 5.2 10*3/uL (ref 1.5–6.5)
NEUT%: 61.5 % (ref 40.0–80.0)
Platelets: 269 10*3/uL (ref 145–400)
RBC: 5.49 10*6/uL (ref 4.20–5.70)
RDW: 14.9 % (ref 11.1–15.7)
WBC: 8.5 10*3/uL (ref 4.0–10.0)

## 2013-02-22 LAB — FERRITIN: Ferritin: 12 ng/mL — ABNORMAL LOW (ref 22–322)

## 2013-02-22 NOTE — Progress Notes (Signed)
Jesse [name] presents today for phlebotomy per MD orders. Phlebotomy procedure started at 1120 and ended at 1130. 500 grams removed. Patient observed for 30 minutes after procedure without any incident. Patient tolerated procedure well. IV needle removed intact.   

## 2013-02-22 NOTE — Progress Notes (Signed)
This office note has been dictated.

## 2013-02-22 NOTE — Patient Instructions (Addendum)
Therapeutic Phlebotomy Therapeutic phlebotomy is the controlled removal of blood from your body for the purpose of treating a medical condition. It is similar to donating blood. Usually, about a pint (470 mL) of blood is removed. The average adult has 9 to 12 pints (4.3 to 5.7 L) of blood. Therapeutic phlebotomy may be used to treat the following medical conditions:  Hemochromatosis. This is a condition in which there is too much iron in the blood.  Polycythemia vera. This is a condition in which there are too many red cells in the blood.  Porphyria cutanea tarda. This is a disease usually passed from one generation to the next (inherited). It is a condition in which an important part of hemoglobin is not made properly. This results in the build up of abnormal amounts of porphyrins in the body.  Sickle cell disease. This is an inherited disease. It is a condition in which the red blood cells form an abnormal crescent shape rather than a round shape. LET YOUR CAREGIVER KNOW ABOUT:  Allergies.  Medicines taken including herbs, eyedrops, over-the-counter medicines, and creams.  Use of steroids (by mouth or creams).  Previous problems with anesthetics or numbing medicine.  History of blood clots.  History of bleeding or blood problems.  Previous surgery.  Possibility of pregnancy, if this applies. RISKS AND COMPLICATIONS This is a simple and safe procedure. Problems are unlikely. However, problems can occur and may include:  Nausea or lightheadedness.  Low blood pressure.  Soreness, bleeding, swelling, or bruising at the needle insertion site.  Infection. BEFORE THE PROCEDURE  This is a procedure that can be done as an outpatient. Confirm the time that you need to arrive for your procedure. Confirm whether there is a need to fast or withhold any medications. It is helpful to wear clothing with sleeves that can be raised above the elbow. A blood sample may be done to determine the  amount of red blood cells or iron in your blood. Plan ahead of time to have someone drive you home after the procedure. PROCEDURE The entire procedure from preparation through recovery takes about 1 hour. The actual collection takes about 10 to 15 minutes.  A needle will be inserted into your vein.  Tubing and a collection bag will be attached to that needle.  Blood will flow through the needle and tubing into the collection bag.  You may be asked to open and close your hand slowly and continuously during the entire collection.  Once the specified amount of blood has been removed from your body, the collection bag and tubing will be clamped.  The needle will be removed.  Pressure will be held on the site of the needle insertion to stop the bleeding. Then a bandage will be placed over the needle insertion site. AFTER THE PROCEDURE  Your recovery will be assessed and monitored. If there are no problems, as an outpatient, you should be able to go home shortly after the procedure.  Document Released: 02/11/2011 Document Revised: 12/02/2011 Document Reviewed: 02/11/2011 [employer] Patient Information 2014 [employer].  

## 2013-02-23 NOTE — Progress Notes (Signed)
CC:   [name]. [name], M.D.  DIAGNOSIS:  Polycythemia vera, JAK2 negative.  CURRENT THERAPY: 1. Phlebotomy to maintain hematocrit less than 45%. 2. Aspirin 81 mg p.o. daily.  INTERIM HISTORY:  Mr. Westenberger comes in for his followup.  He was last phlebotomized back in March.  He is feeling well.  He has had no complaints.  He has had no headaches. He has had no joint aches or pains.  He has had no nausea or vomiting. There has been no pruritus after showering.  Back in March, his ferritin was down to 16.  PHYSICAL EXAMINATION:  General:  This is a well-developed, well- nourished white gentleman in no obvious distress.  Vital signs:  Show temperature of 97.4, pulse 82, respiratory rate 18, blood pressure 123/84.  Weight is 169.  Head and neck:  Shows a normocephalic, atraumatic skull.  There are no ocular or oral lesions.  There are no palpable cervical or supraclavicular lymph nodes.  Lungs:  Clear bilaterally.  Cardiac:  Regular rate and rhythm with a normal S1, S2. There are no murmurs, rubs or bruits.  Abdomen:  Soft with good bowel sounds.  There is no palpable abdominal mass.  There is no fluid wave. There is no palpable hepatosplenomegaly.  Extremities:  Show no clubbing, cyanosis or edema.  Neurological:  Shows no focal neurological deficits.  LABORATORY STUDIES:  White cell count is 8.5, hemoglobin 15.2, hematocrit 43.7, platelet count is 269.  IMPRESSION:  Mr. Cardenas is a 72 year old gentleman with polycythemia.  We will go ahead and phlebotomize him today.  I think we can probably get him through the summertime now.  We should be able to get his hematocrit below 45%.  We will see him back after Labor Day.    ______________________________ [name], M.D. PRE/MEDQ  D:  02/22/2013  T:  02/23/2013  Job:  [id number]

## 2013-03-10 ENCOUNTER — Ambulatory Visit (INDEPENDENT_AMBULATORY_CARE_PROVIDER_SITE_OTHER): Payer: Medicare Other | Admitting: Ophthalmology

## 2013-03-10 DIAGNOSIS — H35039 Hypertensive retinopathy, unspecified eye: Secondary | ICD-10-CM

## 2013-03-10 DIAGNOSIS — I10 Essential (primary) hypertension: Secondary | ICD-10-CM

## 2013-03-10 DIAGNOSIS — H33309 Unspecified retinal break, unspecified eye: Secondary | ICD-10-CM

## 2013-03-10 DIAGNOSIS — H2510 Age-related nuclear cataract, unspecified eye: Secondary | ICD-10-CM

## 2013-03-10 DIAGNOSIS — H43819 Vitreous degeneration, unspecified eye: Secondary | ICD-10-CM

## 2013-03-10 DIAGNOSIS — H33009 Unspecified retinal detachment with retinal break, unspecified eye: Secondary | ICD-10-CM

## 2013-05-31 ENCOUNTER — Ambulatory Visit (HOSPITAL_BASED_OUTPATIENT_CLINIC_OR_DEPARTMENT_OTHER): Payer: Medicare Other | Admitting: Hematology & Oncology

## 2013-05-31 ENCOUNTER — Ambulatory Visit (HOSPITAL_BASED_OUTPATIENT_CLINIC_OR_DEPARTMENT_OTHER): Payer: Medicare Other

## 2013-05-31 ENCOUNTER — Other Ambulatory Visit (HOSPITAL_BASED_OUTPATIENT_CLINIC_OR_DEPARTMENT_OTHER): Payer: Medicare Other | Admitting: Lab

## 2013-05-31 VITALS — BP 112/83 | HR 96 | Temp 96.7°F | Resp 18

## 2013-05-31 VITALS — BP 113/75 | HR 85 | Temp 98.5°F | Resp 18 | Ht 65.0 in | Wt 168.0 lb

## 2013-05-31 DIAGNOSIS — D45 Polycythemia vera: Secondary | ICD-10-CM

## 2013-05-31 DIAGNOSIS — D509 Iron deficiency anemia, unspecified: Secondary | ICD-10-CM

## 2013-05-31 LAB — CBC WITH DIFFERENTIAL (CANCER CENTER ONLY)
BASO#: 0 10*3/uL (ref 0.0–0.2)
BASO%: 0.4 % (ref 0.0–2.0)
EOS%: 0.7 % (ref 0.0–7.0)
Eosinophils Absolute: 0.1 10*3/uL (ref 0.0–0.5)
HCT: 51.8 % — ABNORMAL HIGH (ref 38.7–49.9)
HGB: 17.2 g/dL — ABNORMAL HIGH (ref 13.0–17.1)
LYMPH#: 2 10*3/uL (ref 0.9–3.3)
LYMPH%: 24.6 % (ref 14.0–48.0)
MCH: 28.2 pg (ref 28.0–33.4)
MCHC: 33.2 g/dL (ref 32.0–35.9)
MCV: 85 fL (ref 82–98)
MONO#: 1.3 10*3/uL — ABNORMAL HIGH (ref 0.1–0.9)
MONO%: 15.5 % — ABNORMAL HIGH (ref 0.0–13.0)
NEUT#: 4.8 10*3/uL (ref 1.5–6.5)
NEUT%: 58.8 % (ref 40.0–80.0)
Platelets: 253 10*3/uL (ref 145–400)
RBC: 6.11 10*6/uL — ABNORMAL HIGH (ref 4.20–5.70)
RDW: 18.7 % — ABNORMAL HIGH (ref 11.1–15.7)
WBC: 8.2 10*3/uL (ref 4.0–10.0)

## 2013-05-31 LAB — FERRITIN CHCC: Ferritin: 20 ng/ml — ABNORMAL LOW (ref 22–316)

## 2013-05-31 NOTE — Progress Notes (Signed)
This office note has been dictated.

## 2013-05-31 NOTE — Patient Instructions (Signed)

## 2013-05-31 NOTE — Progress Notes (Signed)
Jesse [name] presents today for phlebotomy per MD orders. Phlebotomy procedure started at 1125 and ended at 1140. 500 ml removed. Patient observed for 30 minutes after procedure without any incident. Patient tolerated procedure well. IV needle removed intact.

## 2013-06-01 NOTE — Progress Notes (Signed)
CC:   [name]. [name], M.D.  DIAGNOSIS:  Polycythemia vera, JAK2 negative.  CURRENT THERAPY: 1. Phlebotomy to maintain hematocrit below 45%. 2. Aspirin 81 mg p.o. q. day.  INTERIM HISTORY:  Jesse [name] comes in for followup.  Feels okay.  We last phlebotomized him back in June.  At that point, his hematocrit was 47.3.  His ferritin back in June was 12.  He is watching what he eats.  He is not taking any kind of vitamin supplements with iron.  He has had no cough or shortness of breath.  He has had no headache.  He has had no problems with hands or feet.  He is going to have cataract surgery for his left eye.  I do not see any problems with him having this.  PHYSICAL EXAMINATION:  General:  This is a well-developed, well- nourished white gentleman, in no obvious distress.  Vital Signs:  Show a temperature of 98.5, pulse 85, respiratory rate 18, blood pressure 113/75.  Weight is 168.  Head and Neck:  Normocephalic, atraumatic skull.  He does have a little bit of facial plethora.  His eyes are a little bit injected with a conjunctival inflammation.  Neck is supple with no adenopathy.  Lungs:  Clear bilaterally.  Cardiac:  Regular rate and rhythm with a normal S1 and S2.  There are no murmurs, rubs, or bruits.  Abdomen:  Soft.  There is no palpable hepatosplenomegaly. There is no fluid wave.  Extremities:  Show no clubbing, cyanosis, or edema.  Neurological:  Shows no focal neurological deficits.  LABORATORY STUDIES:  White cell count is 8.2, hemoglobin 17.2, hematocrit 51.8, platelet count 253.  IMPRESSION:  Jesse [name] is a 72 year old gentleman with polycythemia.  We will go ahead and phlebotomize him today.  Then we are going to have to phlebotomize him next week also.  His bone marrow clearly is under some control.  He is iron deficient. Yet, he still is able to __________ an erythrocytosis.  I will see him back myself in about 6  weeks.    ______________________________ [name], M.D. PRE/MEDQ  D:  05/31/2013  T:  06/01/2013  Job:  [id number]

## 2013-06-07 ENCOUNTER — Ambulatory Visit (HOSPITAL_BASED_OUTPATIENT_CLINIC_OR_DEPARTMENT_OTHER): Payer: Medicare Other

## 2013-06-07 VITALS — BP 122/79 | HR 79 | Temp 97.0°F | Resp 18

## 2013-06-07 DIAGNOSIS — D45 Polycythemia vera: Secondary | ICD-10-CM

## 2013-06-07 NOTE — Progress Notes (Signed)
Fran Lowes presents today for phlebotomy per MD orders. Phlebotomy procedure started at 1105 and ended at 1115. 500 ml removed. Patient observed for 30 minutes after procedure without any incident. Patient tolerated procedure well. IV needle removed intact.

## 2013-06-07 NOTE — Patient Instructions (Signed)

## 2013-07-21 ENCOUNTER — Other Ambulatory Visit: Payer: Medicare Other | Admitting: Lab

## 2013-07-21 ENCOUNTER — Ambulatory Visit (HOSPITAL_BASED_OUTPATIENT_CLINIC_OR_DEPARTMENT_OTHER): Payer: Medicare Other | Admitting: Hematology & Oncology

## 2013-07-21 ENCOUNTER — Ambulatory Visit: Payer: Medicare Other

## 2013-07-21 ENCOUNTER — Ambulatory Visit: Payer: Medicare Other | Admitting: Hematology & Oncology

## 2013-07-21 ENCOUNTER — Other Ambulatory Visit (HOSPITAL_BASED_OUTPATIENT_CLINIC_OR_DEPARTMENT_OTHER): Payer: Medicare Other | Admitting: Lab

## 2013-07-21 VITALS — BP 115/78 | HR 91 | Temp 98.4°F | Resp 18 | Ht 65.0 in | Wt 171.0 lb

## 2013-07-21 DIAGNOSIS — D45 Polycythemia vera: Secondary | ICD-10-CM

## 2013-07-21 LAB — CBC WITH DIFFERENTIAL (CANCER CENTER ONLY)
BASO#: 0 10*3/uL (ref 0.0–0.2)
BASO%: 0.3 % (ref 0.0–2.0)
EOS%: 0.8 % (ref 0.0–7.0)
Eosinophils Absolute: 0.1 10*3/uL (ref 0.0–0.5)
HCT: 43.4 % (ref 38.7–49.9)
HGB: 14.2 g/dL (ref 13.0–17.1)
LYMPH#: 2.1 10*3/uL (ref 0.9–3.3)
LYMPH%: 23.8 % (ref 14.0–48.0)
MCH: 27.7 pg — ABNORMAL LOW (ref 28.0–33.4)
MCHC: 32.7 g/dL (ref 32.0–35.9)
MCV: 85 fL (ref 82–98)
MONO#: 1.3 10*3/uL — ABNORMAL HIGH (ref 0.1–0.9)
MONO%: 14.7 % — ABNORMAL HIGH (ref 0.0–13.0)
NEUT#: 5.4 10*3/uL (ref 1.5–6.5)
NEUT%: 60.4 % (ref 40.0–80.0)
Platelets: 293 10*3/uL (ref 145–400)
RBC: 5.12 10*6/uL (ref 4.20–5.70)
RDW: 15.1 % (ref 11.1–15.7)
WBC: 9 10*3/uL (ref 4.0–10.0)

## 2013-07-21 LAB — FERRITIN CHCC: Ferritin: 14 ng/ml — ABNORMAL LOW (ref 22–316)

## 2013-07-21 NOTE — Progress Notes (Signed)
This office note has been dictated.

## 2013-07-22 LAB — ERYTHROPOIETIN: Erythropoietin: 29.6 m[IU]/mL — ABNORMAL HIGH (ref 2.6–18.5)

## 2013-07-22 NOTE — Progress Notes (Signed)
CC:   [name]. [name], M.D.  DIAGNOSIS:  Polycythemia vera -- JAK2 negative.  CURRENT THERAPY: 1. Phlebotomy to maintain hematocrit less than 45%. 2. Aspirin 81 mg p.o. q. day.  INTERIM HISTORY:  Mr. Mcbryar comes in for his followup.  He is doing quite well.  We last saw him back in early September.  Since then, he has had no problems.  He has been playing golf.  He has been helping to take care of his sister who is physically and mentally challenged.  When we last saw him in September, his ferritin was only 20.  He was last phlebotomized back in September.  He has had no headache.  There has been no pain in his hands or feet. He has had no rashes.  There has been no change in bowel or bladder habits.  PHYSICAL EXAMINATION:  General:  This is a well-developed, well- nourished white gentleman in no obvious distress.  Vital signs: Temperature of 98.4, pulse 91, respiratory rate 18, blood pressure 115/78.  Weight is 171 pounds.  Head and Neck:  Normocephalic, atraumatic skull.  There are no ocular or oral lesions.  There are no palpable cervical or supraclavicular lymph nodes.  Lungs:  Clear bilaterally.  Cardiac:  Regular rate and rhythm with a normal S1 and S2. There are no murmurs, rubs, or bruits.  Abdomen:  Soft.  He has good bowel sounds.  There is no fluid wave.  There is no palpable abdominal mass.  There is no palpable hepatosplenomegaly.  Extremities:  No clubbing, cyanosis, or edema.  Neurological:  No focal neurological deficits.  LABORATORY STUDIES:  White cell count is 9, hemoglobin 14.2, hematocrit 43.4, platelet count 293.  MCV is 85.  Ferritin is 14.  IMPRESSION:  Mr. Maclaughlin is a 72 year old gentleman with polycythemia vera.  He has had no symptoms from this.  We have been pretty aggressive with his phlebotomy program.  He does not need to be phlebotomized today.  His ferritin is quite low.  We will go ahead and plan to get him back after the holidays.  I  think this would be nice for him and he is happy that he does not have to come back.    ______________________________ [name], M.D. PRE/MEDQ  D:  07/21/2013  T:  07/22/2013  Job:  [id number]

## 2013-09-21 ENCOUNTER — Ambulatory Visit (HOSPITAL_BASED_OUTPATIENT_CLINIC_OR_DEPARTMENT_OTHER): Payer: Medicare Other

## 2013-09-21 ENCOUNTER — Ambulatory Visit (HOSPITAL_BASED_OUTPATIENT_CLINIC_OR_DEPARTMENT_OTHER): Payer: Medicare Other | Admitting: Hematology & Oncology

## 2013-09-21 ENCOUNTER — Other Ambulatory Visit (HOSPITAL_BASED_OUTPATIENT_CLINIC_OR_DEPARTMENT_OTHER): Payer: Medicare Other | Admitting: Lab

## 2013-09-21 VITALS — BP 119/72 | HR 92 | Temp 98.3°F | Resp 18 | Ht 66.0 in | Wt 171.0 lb

## 2013-09-21 VITALS — BP 128/83 | HR 88 | Temp 98.0°F | Resp 18

## 2013-09-21 DIAGNOSIS — D45 Polycythemia vera: Secondary | ICD-10-CM

## 2013-09-21 LAB — CBC WITH DIFFERENTIAL (CANCER CENTER ONLY)
BASO#: 0 10*3/uL (ref 0.0–0.2)
BASO%: 0.3 % (ref 0.0–2.0)
EOS%: 1 % (ref 0.0–7.0)
Eosinophils Absolute: 0.1 10*3/uL (ref 0.0–0.5)
HCT: 49.5 % (ref 38.7–49.9)
HGB: 16.1 g/dL (ref 13.0–17.1)
LYMPH#: 1.9 10*3/uL (ref 0.9–3.3)
LYMPH%: 25.5 % (ref 14.0–48.0)
MCH: 27.5 pg — ABNORMAL LOW (ref 28.0–33.4)
MCHC: 32.5 g/dL (ref 32.0–35.9)
MCV: 85 fL (ref 82–98)
MONO#: 1.1 10*3/uL — ABNORMAL HIGH (ref 0.1–0.9)
MONO%: 15.7 % — ABNORMAL HIGH (ref 0.0–13.0)
NEUT#: 4.2 10*3/uL (ref 1.5–6.5)
NEUT%: 57.5 % (ref 40.0–80.0)
Platelets: 255 10*3/uL (ref 145–400)
RBC: 5.85 10*6/uL — ABNORMAL HIGH (ref 4.20–5.70)
RDW: 18.2 % — ABNORMAL HIGH (ref 11.1–15.7)
WBC: 7.3 10*3/uL (ref 4.0–10.0)

## 2013-09-21 NOTE — Progress Notes (Signed)
This office note has been dictated.

## 2013-09-21 NOTE — Progress Notes (Signed)
Jesse [name] presents today for phlebotomy per MD orders. Phlebotomy procedure started at 1040 and ended at 1046. 500 grams removed. Patient observed for 30 minutes after procedure without any incident. Patient tolerated procedure well. IV needle removed intact. Pt provided with nourishments and comfort measures via TV.

## 2013-09-21 NOTE — Patient Instructions (Signed)
Therapeutic Phlebotomy Therapeutic phlebotomy is the controlled removal of blood from your body for the purpose of treating a medical condition. It is similar to donating blood. Usually, about a pint (470 mL) of blood is removed. The average adult has 9 to 12 pints (4.3 to 5.7 L) of blood. Therapeutic phlebotomy may be used to treat the following medical conditions:  Hemochromatosis. This is a condition in which there is too much iron in the blood.  Polycythemia vera. This is a condition in which there are too many red cells in the blood.  Porphyria cutanea tarda. This is a disease usually passed from one generation to the next (inherited). It is a condition in which an important part of hemoglobin is not made properly. This results in the build up of abnormal amounts of porphyrins in the body.  Sickle cell disease. This is an inherited disease. It is a condition in which the red blood cells form an abnormal crescent shape rather than a round shape. LET YOUR CAREGIVER KNOW ABOUT:  Allergies.  Medicines taken including herbs, eyedrops, over-the-counter medicines, and creams.  Use of steroids (by mouth or creams).  Previous problems with anesthetics or numbing medicine.  History of blood clots.  History of bleeding or blood problems.  Previous surgery.  Possibility of pregnancy, if this applies. RISKS AND COMPLICATIONS This is a simple and safe procedure. Problems are unlikely. However, problems can occur and may include:  Nausea or lightheadedness.  Low blood pressure.  Soreness, bleeding, swelling, or bruising at the needle insertion site.  Infection. BEFORE THE PROCEDURE  This is a procedure that can be done as an outpatient. Confirm the time that you need to arrive for your procedure. Confirm whether there is a need to fast or withhold any medications. It is helpful to wear clothing with sleeves that can be raised above the elbow. A blood sample may be done to determine the  amount of red blood cells or iron in your blood. Plan ahead of time to have someone drive you home after the procedure. PROCEDURE The entire procedure from preparation through recovery takes about 1 hour. The actual collection takes about 10 to 15 minutes.  A needle will be inserted into your vein.  Tubing and a collection bag will be attached to that needle.  Blood will flow through the needle and tubing into the collection bag.  You may be asked to open and close your hand slowly and continuously during the entire collection.  Once the specified amount of blood has been removed from your body, the collection bag and tubing will be clamped.  The needle will be removed.  Pressure will be held on the site of the needle insertion to stop the bleeding. Then a bandage will be placed over the needle insertion site. AFTER THE PROCEDURE  Your recovery will be assessed and monitored. If there are no problems, as an outpatient, you should be able to go home shortly after the procedure.  Document Released: 02/11/2011 Document Revised: 12/02/2011 Document Reviewed: 02/11/2011 [employer] Patient Information 2014 [employer].  

## 2013-09-24 NOTE — Progress Notes (Signed)
DIAGNOSIS:  Polycythemia vera-JAK2 negative.  CURRENT THERAPY: 1. Phlebotomy to maintain hematocrit below 45%. 2. Aspirin 81 mg p.o. daily.  INTERIM HISTORY:  Jesse [name] comes in for his followup.  He is doing quite well.  He has had no complaints since we last saw him.  He has had no headache.  There has been no pain in his hands or feet. He has had no rashes.  There has been no change in bowel or bladder habits.  He is going to have a cataract surgery for his left eye.  He says it is probably another 3 months.  He had a detached retina that needs to heal up first.  When we last saw him, his iron studies continued to show that he was iron deficient.  His ferritin was only 14.  PHYSICAL EXAMINATION:  This is a well-developed, well-nourished white gentleman, no obvious distress.  Vital Signs:  Temperature of 98.3, pulse 92, respiratory rate 18, blood pressure 119/72.  Weight is 171 pounds.  Head and Neck:  Normocephalic, atraumatic skull.  There are no ocular or oral lesions.  There are no palpable cervical or supraclavicular lymph nodes.  Lungs:  Clear bilaterally.  Cardiac: Regular rate and rhythm with a normal S1 and S2.  There are no murmurs, rubs, or bruits.  Abdomen:  Soft.  He has good bowel sounds.  There is no fluid wave.  There is no palpable abdominal mass.  No palpable hepatosplenomegaly.  Extremities:  No clubbing, cyanosis, or edema.  He has good range motion of the joints.  Neurological:  Shows no focal neurological deficits.  Skin:  Does show a little bit of a ruddy complexion.  LABORATORY STUDIES:  White cell count is 7.3, hemoglobin 16, hematocrit 49.5, platelet count 255.  IMPRESSION:  Jesse [name] is a nice 73 year old gentleman with polycythemia vera.  Again, he is JAK2 negative.  However, he definitively needs to be phlebotomized as his blood count does tend to trend upward if we do not maintain a phlebotomy program.  For now, we will go ahead and  phlebotomize him.  I will go ahead and plan to get him back to see me in another 6 weeks.   ______________________________ Volanda Napoleon, M.D. PRE/MEDQ  D:  09/21/2013  T:  09/22/2013  Job:  [id number]

## 2013-11-02 ENCOUNTER — Other Ambulatory Visit (HOSPITAL_BASED_OUTPATIENT_CLINIC_OR_DEPARTMENT_OTHER): Payer: Medicare Other | Admitting: Lab

## 2013-11-02 ENCOUNTER — Ambulatory Visit (HOSPITAL_BASED_OUTPATIENT_CLINIC_OR_DEPARTMENT_OTHER): Payer: Medicare Other | Admitting: Hematology & Oncology

## 2013-11-02 ENCOUNTER — Encounter: Payer: Self-pay | Admitting: Hematology & Oncology

## 2013-11-02 ENCOUNTER — Ambulatory Visit (HOSPITAL_BASED_OUTPATIENT_CLINIC_OR_DEPARTMENT_OTHER): Payer: Medicare Other

## 2013-11-02 VITALS — BP 118/83 | HR 91 | Temp 97.3°F | Resp 16

## 2013-11-02 VITALS — BP 118/65 | HR 92 | Temp 98.1°F | Resp 18 | Ht 66.0 in | Wt 172.0 lb

## 2013-11-02 DIAGNOSIS — D45 Polycythemia vera: Secondary | ICD-10-CM

## 2013-11-02 LAB — CBC WITH DIFFERENTIAL (CANCER CENTER ONLY)
BASO#: 0 10*3/uL (ref 0.0–0.2)
BASO%: 0.3 % (ref 0.0–2.0)
EOS%: 0.8 % (ref 0.0–7.0)
Eosinophils Absolute: 0.1 10*3/uL (ref 0.0–0.5)
HCT: 47.1 % (ref 38.7–49.9)
HGB: 15.2 g/dL (ref 13.0–17.1)
LYMPH#: 2.3 10*3/uL (ref 0.9–3.3)
LYMPH%: 31.1 % (ref 14.0–48.0)
MCH: 27.4 pg — ABNORMAL LOW (ref 28.0–33.4)
MCHC: 32.3 g/dL (ref 32.0–35.9)
MCV: 85 fL (ref 82–98)
MONO#: 1.2 10*3/uL — ABNORMAL HIGH (ref 0.1–0.9)
MONO%: 16.5 % — ABNORMAL HIGH (ref 0.0–13.0)
NEUT#: 3.7 10*3/uL (ref 1.5–6.5)
NEUT%: 51.3 % (ref 40.0–80.0)
Platelets: 266 10*3/uL (ref 145–400)
RBC: 5.55 10*6/uL (ref 4.20–5.70)
RDW: 17 % — ABNORMAL HIGH (ref 11.1–15.7)
WBC: 7.2 10*3/uL (ref 4.0–10.0)

## 2013-11-02 NOTE — Patient Instructions (Signed)
Therapeutic Phlebotomy Therapeutic phlebotomy is the controlled removal of blood from your body for the purpose of treating a medical condition. It is similar to donating blood. Usually, about a pint (470 mL) of blood is removed. The average adult has 9 to 12 pints (4.3 to 5.7 L) of blood. Therapeutic phlebotomy may be used to treat the following medical conditions:  Hemochromatosis. This is a condition in which there is too much iron in the blood.  Polycythemia vera. This is a condition in which there are too many red cells in the blood.  Porphyria cutanea tarda. This is a disease usually passed from one generation to the next (inherited). It is a condition in which an important part of hemoglobin is not made properly. This results in the build up of abnormal amounts of porphyrins in the body.  Sickle cell disease. This is an inherited disease. It is a condition in which the red blood cells form an abnormal crescent shape rather than a round shape. LET YOUR CAREGIVER KNOW ABOUT:  Allergies.  Medicines taken including herbs, eyedrops, over-the-counter medicines, and creams.  Use of steroids (by mouth or creams).  Previous problems with anesthetics or numbing medicine.  History of blood clots.  History of bleeding or blood problems.  Previous surgery.  Possibility of pregnancy, if this applies. RISKS AND COMPLICATIONS This is a simple and safe procedure. Problems are unlikely. However, problems can occur and may include:  Nausea or lightheadedness.  Low blood pressure.  Soreness, bleeding, swelling, or bruising at the needle insertion site.  Infection. BEFORE THE PROCEDURE  This is a procedure that can be done as an outpatient. Confirm the time that you need to arrive for your procedure. Confirm whether there is a need to fast or withhold any medications. It is helpful to wear clothing with sleeves that can be raised above the elbow. A blood sample may be done to determine the  amount of red blood cells or iron in your blood. Plan ahead of time to have someone drive you home after the procedure. PROCEDURE The entire procedure from preparation through recovery takes about 1 hour. The actual collection takes about 10 to 15 minutes.  A needle will be inserted into your vein.  Tubing and a collection bag will be attached to that needle.  Blood will flow through the needle and tubing into the collection bag.  You may be asked to open and close your hand slowly and continuously during the entire collection.  Once the specified amount of blood has been removed from your body, the collection bag and tubing will be clamped.  The needle will be removed.  Pressure will be held on the site of the needle insertion to stop the bleeding. Then a bandage will be placed over the needle insertion site. AFTER THE PROCEDURE  Your recovery will be assessed and monitored. If there are no problems, as an outpatient, you should be able to go home shortly after the procedure.  Document Released: 02/11/2011 Document Revised: 12/02/2011 Document Reviewed: 02/11/2011 [employer] Patient Information 2014 [employer].  

## 2013-11-02 NOTE — Progress Notes (Signed)
Hematology and Oncology Follow Up Visit  Jesse [name] [id number] August 21, 1941 73 y.o. 11/02/2013   Principle Diagnosis:   Polycythemia vera  Current Therapy:    #1 phlebotomy to maintain hematocrit below 45%. #2 aspirin 81 mg by mouth daily     Interim History:  Jesse [name] is in for followup. We last saw him back in December. He was phlebotomized back then.  Is doing well. He's had no issues with respect to the polycythemia. He has had ocular issues. He had cataract surgery. He had a detached retina. This all seemed to be improving. He has had no headache. He's had no burning in his hands or feet. He's had no fever. His appetite is good. He's had no rashes. He's had no change in bowel or bladder habits.  We are keeping him iron deficient. His last ferritin was 14 back in October.  Medications: Current outpatient prescriptions:ALPRAZolam (XANAX) 0.5 MG tablet, Take 0.25 mg by mouth at bedtime as needed. For sleep, Disp: , Rfl: ;  aspirin EC 81 MG tablet, Take 81 mg by mouth daily., Disp: , Rfl: ;  cholecalciferol (VITAMIN D) 1000 UNITS tablet, Take 1,000 Units by mouth daily.  , Disp: , Rfl: ;  fish oil-omega-3 fatty acids 1000 MG capsule, Take 1 g by mouth daily. , Disp: , Rfl:  lisinopril-hydrochlorothiazide (PRINZIDE,ZESTORETIC) 20-12.5 MG per tablet, Take 1 tablet by mouth daily.  , Disp: , Rfl: ;  simvastatin (ZOCOR) 20 MG tablet, Take 20 mg by mouth at bedtime.  , Disp: , Rfl:   Allergies: No Known Allergies  Past Medical History, Surgical history, Social history, and Family History were reviewed and updated.  Review of Systems: As above  Physical Exam:  height is 5\' 6"  (1.676 m) and weight is 172 lb (78.019 kg). His oral temperature is 98.1 F (36.7 C). His blood pressure is 118/65 and his pulse is 92. His respiration is 18.   Well-developed and well nourished white gentleman in no obvious distress. He is alert and oriented. There is no ocular oral lesion. His conjunctiva are  pink. He has no facial plethora. There is no adenopathy in his neck. Lungs are clear bilateral. Cardiac exam regular rate and rhythm with no murmurs rubs or bruits. Abdomen soft. Has good bowel sounds. There is no fluid wave. There is no palpable hepato- splenomegaly. Back exam no tenderness of spine ribs or hips. Extremities shows no clubbing cyanosis or edema. Has good range of motion of his joints. Skin exam no rashes ecchymoses or petechia. Neurological exam shows no focal neurological deficits. Lab Results  Component Value Date   WBC 7.2 11/02/2013   HGB 15.2 11/02/2013   HCT 47.1 11/02/2013   MCV 85 11/02/2013   PLT 266 11/02/2013     Chemistry      Component Value Date/Time   NA 135 06/04/2012 0905   K 3.8 06/04/2012 0905   CL 101 06/04/2012 0905   CO2 21 06/04/2012 0905   BUN 11 06/04/2012 0905   CREATININE 0.76 06/04/2012 0905      Component Value Date/Time   CALCIUM 10.0 06/04/2012 0905   ALKPHOS 52 09/25/2007 0916   AST 23 09/25/2007 0916   ALT 32 09/25/2007 0916   BILITOT 1.4* 09/25/2007 0916         Impression and Plan: Jesse [name] is a 73 year old him. He has polycythemia vera. He is JAK2 negative. He had no comp cases so far from the polycythemia..  We will phlebotomize him  today.  I will plan to see him back in another 2 or 3 months. Typically, we see him back, he doesn't require a phlebotomy.   Volanda Napoleon, MD 2/10/20159:46 AM

## 2013-12-28 ENCOUNTER — Other Ambulatory Visit (HOSPITAL_BASED_OUTPATIENT_CLINIC_OR_DEPARTMENT_OTHER): Payer: Medicare Other | Admitting: Lab

## 2013-12-28 ENCOUNTER — Ambulatory Visit (HOSPITAL_BASED_OUTPATIENT_CLINIC_OR_DEPARTMENT_OTHER): Payer: Medicare Other | Admitting: Hematology & Oncology

## 2013-12-28 ENCOUNTER — Ambulatory Visit (HOSPITAL_BASED_OUTPATIENT_CLINIC_OR_DEPARTMENT_OTHER): Payer: Medicare Other

## 2013-12-28 VITALS — BP 116/78 | HR 92 | Temp 97.4°F | Resp 16

## 2013-12-28 VITALS — BP 139/83 | HR 85 | Temp 97.6°F | Resp 16 | Wt 170.0 lb

## 2013-12-28 DIAGNOSIS — D45 Polycythemia vera: Secondary | ICD-10-CM

## 2013-12-28 DIAGNOSIS — Z7982 Long term (current) use of aspirin: Secondary | ICD-10-CM

## 2013-12-28 DIAGNOSIS — D509 Iron deficiency anemia, unspecified: Secondary | ICD-10-CM

## 2013-12-28 LAB — CBC WITH DIFFERENTIAL (CANCER CENTER ONLY)
BASO#: 0 10*3/uL (ref 0.0–0.2)
BASO%: 0.4 % (ref 0.0–2.0)
EOS%: 0.9 % (ref 0.0–7.0)
Eosinophils Absolute: 0.1 10*3/uL (ref 0.0–0.5)
HCT: 47.1 % (ref 38.7–49.9)
HGB: 15.3 g/dL (ref 13.0–17.1)
LYMPH#: 2.3 10*3/uL (ref 0.9–3.3)
LYMPH%: 29.7 % (ref 14.0–48.0)
MCH: 27.8 pg — ABNORMAL LOW (ref 28.0–33.4)
MCHC: 32.5 g/dL (ref 32.0–35.9)
MCV: 86 fL (ref 82–98)
MONO#: 1.1 10*3/uL — ABNORMAL HIGH (ref 0.1–0.9)
MONO%: 15 % — ABNORMAL HIGH (ref 0.0–13.0)
NEUT#: 4.1 10*3/uL (ref 1.5–6.5)
NEUT%: 54 % (ref 40.0–80.0)
Platelets: 264 10*3/uL (ref 145–400)
RBC: 5.5 10*6/uL (ref 4.20–5.70)
RDW: 15.1 % (ref 11.1–15.7)
WBC: 7.6 10*3/uL (ref 4.0–10.0)

## 2013-12-28 LAB — FERRITIN CHCC: Ferritin: 13 ng/ml — ABNORMAL LOW (ref 22–316)

## 2013-12-28 NOTE — Progress Notes (Signed)
Hematology and Oncology Follow Up Visit  SABIAN KUBA [id number] [birth year] 73 y.o. 12/28/2013   Principle Diagnosis:   Polycythemia vera  Current Therapy:    Phlebotomy to maintain hematocrit below 45 %  Aspirin 81 mg by mouth daily     Interim History:  Mr.  Westcott is back for followup. He's doing okay. Last saw him back in February. He's doing okay. She's had no complications from the polycythemia.  He's had no headache. He's had no chest pain. There's been no cardiac issues. He's had no cough or shortness of breath. He's had no nausea vomiting. There's been no change in bowel or bladder habits.  We are making him iron deficient.   Medications: Current outpatient prescriptions:ALPRAZolam (XANAX) 0.5 MG tablet, Take 0.25 mg by mouth at bedtime as needed. For sleep, Disp: , Rfl: ;  aspirin EC 81 MG tablet, Take 81 mg by mouth daily., Disp: , Rfl: ;  cholecalciferol (VITAMIN D) 1000 UNITS tablet, Take 1,000 Units by mouth daily.  , Disp: , Rfl: ;  fish oil-omega-3 fatty acids 1000 MG capsule, Take 1 g by mouth daily. , Disp: , Rfl:  lisinopril-hydrochlorothiazide (PRINZIDE,ZESTORETIC) 20-12.5 MG per tablet, Take 1 tablet by mouth daily.  , Disp: , Rfl: ;  simvastatin (ZOCOR) 20 MG tablet, Take 20 mg by mouth at bedtime.  , Disp: , Rfl:   Allergies: No Known Allergies  Past Medical History, Surgical history, Social history, and Family History were reviewed and updated.  Review of Systems: Back for followup.  Physical Exam:  weight is 170 lb (77.111 kg). His oral temperature is 97.6 F (36.4 C). His blood pressure is 139/83 and his pulse is 85. His respiration is 16.   Well-developed well-nourished gentleman. Head exam shows no sclera icterus. There is no conjunctival inflammation. There is no adenopathy in the neck. Lungs are clear. Cardiac exam regular in rhythm with no murmurs rubs or bruits. Abdomen is soft. Has good bowel sounds. There is no palpable liver or spleen tip. Neck  exam no tenderness over the spine ribs or hips. Extremities shows no clubbing cyanosis or edema. Neurological exam no focal neurological deficits. Skin exam no rashes ecchymoses or petechia.  Lab Results  Component Value Date   WBC 7.6 12/28/2013   HGB 15.3 12/28/2013   HCT 47.1 12/28/2013   MCV 86 12/28/2013   PLT 264 12/28/2013     Chemistry      Component Value Date/Time   NA 135 06/04/2012 0905   K 3.8 06/04/2012 0905   CL 101 06/04/2012 0905   CO2 21 06/04/2012 0905   BUN 11 06/04/2012 0905   CREATININE 0.76 06/04/2012 0905      Component Value Date/Time   CALCIUM 10.0 06/04/2012 0905   ALKPHOS 52 09/25/2007 0916   AST 23 09/25/2007 0916   ALT 32 09/25/2007 0916   BILITOT 1.4* 09/25/2007 0916         Impression and Plan: Mr. Lottman is a 73 year old gentleman with polycythemia vera. He's doing well with this. We will go ahead and phlebotomize him today.  She will continue taking his aspirin.  I see no indication that the polycythemia is transforming.  We will get him back in 3 months.     Volanda Napoleon, MD 4/7/201510:28 AM

## 2013-12-28 NOTE — Progress Notes (Signed)
Jesse [name] presents today for phlebotomy per MD orders. Phlebotomy procedure started at 1024 and ended at 1035. 586mL removed. Patient observed for 30 minutes after procedure without any incident. Patient tolerated procedure well. IV needle removed intact.

## 2013-12-28 NOTE — Patient Instructions (Signed)

## 2014-03-14 ENCOUNTER — Ambulatory Visit (INDEPENDENT_AMBULATORY_CARE_PROVIDER_SITE_OTHER): Payer: Medicare Other | Admitting: Ophthalmology

## 2014-03-14 DIAGNOSIS — H33309 Unspecified retinal break, unspecified eye: Secondary | ICD-10-CM

## 2014-03-14 DIAGNOSIS — H33009 Unspecified retinal detachment with retinal break, unspecified eye: Secondary | ICD-10-CM

## 2014-03-14 DIAGNOSIS — H43819 Vitreous degeneration, unspecified eye: Secondary | ICD-10-CM

## 2014-03-14 DIAGNOSIS — H35039 Hypertensive retinopathy, unspecified eye: Secondary | ICD-10-CM

## 2014-03-14 DIAGNOSIS — H2510 Age-related nuclear cataract, unspecified eye: Secondary | ICD-10-CM

## 2014-03-14 DIAGNOSIS — I10 Essential (primary) hypertension: Secondary | ICD-10-CM

## 2014-03-28 ENCOUNTER — Encounter: Payer: Self-pay | Admitting: Hematology & Oncology

## 2014-03-28 ENCOUNTER — Other Ambulatory Visit (HOSPITAL_BASED_OUTPATIENT_CLINIC_OR_DEPARTMENT_OTHER): Payer: Medicare Other | Admitting: Lab

## 2014-03-28 ENCOUNTER — Ambulatory Visit (HOSPITAL_BASED_OUTPATIENT_CLINIC_OR_DEPARTMENT_OTHER): Payer: Medicare Other

## 2014-03-28 ENCOUNTER — Ambulatory Visit (HOSPITAL_BASED_OUTPATIENT_CLINIC_OR_DEPARTMENT_OTHER): Payer: Medicare Other | Admitting: Hematology & Oncology

## 2014-03-28 VITALS — BP 123/81 | HR 90 | Temp 98.0°F | Resp 18 | Ht 66.0 in | Wt 168.0 lb

## 2014-03-28 VITALS — BP 136/88 | HR 70 | Resp 20

## 2014-03-28 DIAGNOSIS — D45 Polycythemia vera: Secondary | ICD-10-CM

## 2014-03-28 LAB — IRON AND TIBC CHCC
%SAT: 20 % (ref 20–55)
Iron: 80 ug/dL (ref 42–163)
TIBC: 406 ug/dL (ref 202–409)
UIBC: 327 ug/dL (ref 117–376)

## 2014-03-28 LAB — CBC WITH DIFFERENTIAL (CANCER CENTER ONLY)
BASO#: 0 10*3/uL (ref 0.0–0.2)
BASO%: 0.4 % (ref 0.0–2.0)
EOS%: 0.6 % (ref 0.0–7.0)
Eosinophils Absolute: 0 10*3/uL (ref 0.0–0.5)
HCT: 49.5 % (ref 38.7–49.9)
HGB: 16.8 g/dL (ref 13.0–17.1)
LYMPH#: 2 10*3/uL (ref 0.9–3.3)
LYMPH%: 29.8 % (ref 14.0–48.0)
MCH: 28.1 pg (ref 28.0–33.4)
MCHC: 33.9 g/dL (ref 32.0–35.9)
MCV: 83 fL (ref 82–98)
MONO#: 0.9 10*3/uL (ref 0.1–0.9)
MONO%: 13.7 % — ABNORMAL HIGH (ref 0.0–13.0)
NEUT#: 3.8 10*3/uL (ref 1.5–6.5)
NEUT%: 55.5 % (ref 40.0–80.0)
Platelets: 263 10*3/uL (ref 145–400)
RBC: 5.97 10*6/uL — ABNORMAL HIGH (ref 4.20–5.70)
RDW: 17.2 % — ABNORMAL HIGH (ref 11.1–15.7)
WBC: 6.9 10*3/uL (ref 4.0–10.0)

## 2014-03-28 LAB — CHCC SATELLITE - SMEAR

## 2014-03-28 LAB — FERRITIN CHCC: Ferritin: 18 ng/ml — ABNORMAL LOW (ref 22–316)

## 2014-03-28 NOTE — Progress Notes (Signed)
Jesse [name] presents today for phlebotomy per MD orders. Phlebotomy procedure started at 1125 and ended at 1155. First needle in left antecubital clotted, re-cannulated in right antecubital without incident with good blood flow. Approximately 500 mls removed. Patient observed for 30 minutes after procedure without any incident. Patient tolerated procedure well. IV needle removed intact.  1220 Left antecubital site bleeding, dressing removed, clean dressing reapplied.

## 2014-03-28 NOTE — Patient Instructions (Signed)
Therapeutic Phlebotomy Care After Refer to this sheet in the next few weeks. These instructions provide you with information on caring for yourself after your procedure. Your caregiver may also give you more specific instructions. Your treatment has been planned according to current medical practices, but problems sometimes occur. Call your caregiver if you have any problems or questions after your procedure. HOME CARE INSTRUCTIONS Most people can go back to their normal activities right away. Before you leave, be sure to ask if there is anything you should or should not do. In general, it would be wise to:  Keep the bandage dry. You can remove the bandage after about 5 hours.  Eat well-balanced meals for the next 24 hours.  Drink enough fluids to keep your urine clear or pale yellow.  Avoid drinking alcohol minimally until after eating.  Avoid smoking for at least 30 minutes after the procedure.  Avoid strenous physical activity or heavy lifting or pulling for about 5 hours after the procedure.  Athletes should avoid strenous exercise for 12 hours or more.  Change positions slowly for the remainder of the day to prevent lightheadedness or fainting.  If you feel lightheaded, lie down until the feeling subsides.  If you have bleeding from the needle insertion site, elevate your arm and press firmly on the site until the bleeding stops.  If bruising or bleeding appears under the skin, apply ice to the area for 15 to 20 minutes, 3 to 4 times per day. Put the ice in a plastic bag and place a towel between the bag of ice and your skin. Do this while you are awake for the first 24 hours. The ice packs can be stopped before 24 hours if the swelling goes away. If swelling persists after 24 hours, a warm, moist washcloth can be applied to the area for 15 to 20 minutes, 3 to 4 times per day. The warm, moist treatments can be stopped when the swelling goes away.  It is important to continue further  therapeutic phlebotomy as directed by your caregiver. SEEK MEDICAL CARE IF:  There is bleeding or fluid leaking from the needle insertion site.  The needle insertion site becomes swollen, red, or sore.  You feel lightheaded, dizzy or nauseated, and the feeling does not go away.  You notice new bruising at the needle insertion site.  You feel more weak or tired than normal.  You develop a fever. SEEK IMMEDIATE MEDICAL CARE IF:   There is increased bleeding, pain, or swelling from the needle insertion site.  You have severe nausea or vomiting.  You have chest pain.  You have trouble breathing. MAKE SURE YOU:  Understand these instructions.  Will watch your condition.  Will get help right away if you are not doing well or get worse. Document Released: 02/11/2011 Document Revised: 12/02/2011 Document Reviewed: 02/11/2011 Exit[employer] Patient Information 2015 [employer]. This information is not intended to replace advice given to you by your health care provider. Make sure you discuss any questions you have with your health care provider.  

## 2014-03-28 NOTE — Progress Notes (Signed)
Hematology and Oncology Follow Up Visit  Jesse [name] [id number] 12-03-1940 73 y.o. 03/28/2014   Principle Diagnosis:   Polycythemia vera  Current Therapy:    Phlebotomy to maintain hematocrit below 45 %  Aspirin 81 mg by mouth daily     Interim History:  [name] is back for followup. He's doing okay. Last saw him back in April. He's doing okay. She's had no complications from the polycythemia.  He's had no headache. He's had no chest pain. There's been no cardiac issues. He's had no cough or shortness of breath. He's had no nausea vomiting. There's been no change in bowel or bladder habits.  We are making him iron deficient. He's a ferritin back in April was 13.   Medications: Current outpatient prescriptions:ALPRAZolam (XANAX) 0.5 MG tablet, Take 0.25 mg by mouth at bedtime as needed. For sleep, Disp: , Rfl: ;  aspirin EC 81 MG tablet, Take 81 mg by mouth daily., Disp: , Rfl: ;  cholecalciferol (VITAMIN D) 1000 UNITS tablet, Take 1,000 Units by mouth daily.  , Disp: , Rfl: ;  fish oil-omega-3 fatty acids 1000 MG capsule, Take 1 g by mouth daily. , Disp: , Rfl:  lisinopril-hydrochlorothiazide (PRINZIDE,ZESTORETIC) 20-12.5 MG per tablet, Take 1 tablet by mouth daily.  , Disp: , Rfl: ;  simvastatin (ZOCOR) 20 MG tablet, Take 20 mg by mouth at bedtime.  , Disp: , Rfl:   Allergies: No Known Allergies  Past Medical History, Surgical history, Social history, and Family History were reviewed and updated.  Review of Systems: Back for followup.  Physical Exam:  height is 5\' 6"  (1.676 m) and weight is 168 lb (76.204 kg). His oral temperature is 98 F (36.7 C). His blood pressure is 123/81 and his pulse is 90. His respiration is 18.   Well-developed well-nourished gentleman. Head exam shows no sclera icterus. There is no conjunctival inflammation. There is no adenopathy in the neck. Lungs are clear. Cardiac exam regular in rhythm with no murmurs rubs or bruits. Abdomen is soft. Has good  bowel sounds. There is no palpable liver or spleen tip. Neck exam no tenderness over the spine ribs or hips. Extremities shows no clubbing cyanosis or edema. Neurological exam no focal neurological deficits. Skin exam no rashes ecchymoses or petechia.  Lab Results  Component Value Date   WBC 6.9 03/28/2014   HGB 16.8 03/28/2014   HCT 49.5 03/28/2014   MCV 83 03/28/2014   PLT 263 03/28/2014     Chemistry      Component Value Date/Time   NA 135 06/04/2012 0905   K 3.8 06/04/2012 0905   CL 101 06/04/2012 0905   CO2 21 06/04/2012 0905   BUN 11 06/04/2012 0905   CREATININE 0.76 06/04/2012 0905      Component Value Date/Time   CALCIUM 10.0 06/04/2012 0905   ALKPHOS 52 09/25/2007 0916   AST 23 09/25/2007 0916   ALT 32 09/25/2007 0916   BILITOT 1.4* 09/25/2007 0916         Impression and Plan: Jesse [name] is a 73 year old gentleman with polycythemia vera. He's doing well with this. We will go ahead and phlebotomize him today.  She will continue taking his aspirin.  I see no indication that the polycythemia is transforming.  We will get him back in 6 weeks.    Volanda Napoleon, MD 7/6/201511:32 AM

## 2014-05-06 ENCOUNTER — Other Ambulatory Visit: Payer: Self-pay

## 2014-05-06 DIAGNOSIS — D45 Polycythemia vera: Secondary | ICD-10-CM

## 2014-05-09 ENCOUNTER — Ambulatory Visit (HOSPITAL_BASED_OUTPATIENT_CLINIC_OR_DEPARTMENT_OTHER): Payer: Medicare Other

## 2014-05-09 ENCOUNTER — Encounter: Payer: Self-pay | Admitting: Family

## 2014-05-09 ENCOUNTER — Other Ambulatory Visit (HOSPITAL_BASED_OUTPATIENT_CLINIC_OR_DEPARTMENT_OTHER): Payer: Medicare Other | Admitting: Lab

## 2014-05-09 ENCOUNTER — Ambulatory Visit (HOSPITAL_BASED_OUTPATIENT_CLINIC_OR_DEPARTMENT_OTHER): Payer: Medicare Other | Admitting: Family

## 2014-05-09 VITALS — BP 127/76 | HR 89 | Temp 97.8°F | Resp 18 | Ht 66.0 in | Wt 169.0 lb

## 2014-05-09 VITALS — BP 133/85 | HR 92 | Temp 97.0°F | Resp 20

## 2014-05-09 DIAGNOSIS — D45 Polycythemia vera: Secondary | ICD-10-CM

## 2014-05-09 LAB — COMPREHENSIVE METABOLIC PANEL
ALT: 25 U/L (ref 0–53)
AST: 22 U/L (ref 0–37)
Albumin: 4.2 g/dL (ref 3.5–5.2)
Alkaline Phosphatase: 47 U/L (ref 39–117)
BUN: 11 mg/dL (ref 6–23)
CO2: 25 mEq/L (ref 19–32)
Calcium: 10.1 mg/dL (ref 8.4–10.5)
Chloride: 97 mEq/L (ref 96–112)
Creatinine, Ser: 0.83 mg/dL (ref 0.50–1.35)
Glucose, Bld: 102 mg/dL — ABNORMAL HIGH (ref 70–99)
Potassium: 3.9 mEq/L (ref 3.5–5.3)
Sodium: 132 mEq/L — ABNORMAL LOW (ref 135–145)
Total Bilirubin: 1.2 mg/dL (ref 0.2–1.2)
Total Protein: 7 g/dL (ref 6.0–8.3)

## 2014-05-09 LAB — CBC WITH DIFFERENTIAL (CANCER CENTER ONLY)
BASO#: 0 10*3/uL (ref 0.0–0.2)
BASO%: 0.3 % (ref 0.0–2.0)
EOS%: 0.5 % (ref 0.0–7.0)
Eosinophils Absolute: 0 10*3/uL (ref 0.0–0.5)
HCT: 47.7 % (ref 38.7–49.9)
HGB: 16.1 g/dL (ref 13.0–17.1)
LYMPH#: 1.6 10*3/uL (ref 0.9–3.3)
LYMPH%: 23.8 % (ref 14.0–48.0)
MCH: 28.4 pg (ref 28.0–33.4)
MCHC: 33.8 g/dL (ref 32.0–35.9)
MCV: 84 fL (ref 82–98)
MONO#: 1 10*3/uL — ABNORMAL HIGH (ref 0.1–0.9)
MONO%: 14.8 % — ABNORMAL HIGH (ref 0.0–13.0)
NEUT#: 4 10*3/uL (ref 1.5–6.5)
NEUT%: 60.6 % (ref 40.0–80.0)
Platelets: 246 10*3/uL (ref 145–400)
RBC: 5.66 10*6/uL (ref 4.20–5.70)
RDW: 16.6 % — ABNORMAL HIGH (ref 11.1–15.7)
WBC: 6.6 10*3/uL (ref 4.0–10.0)

## 2014-05-09 LAB — FERRITIN CHCC: Ferritin: 17 ng/ml — ABNORMAL LOW (ref 22–316)

## 2014-05-09 NOTE — Progress Notes (Signed)
[facility]  [phone] [phone]  ID: Jesse [name] OB: 03-09-1941 MR#: [id number] YIA#:[id number] Patient Care Team: Willey Blade, MD as PCP - General  DIAGNOSIS:  Polycythemia vera  INTERVAL HISTORY: [name] is back today for a follow-up. He is doing well. He has had no complications from the polycythemia. He is a little tired. He denies fever, chills, n/v, rash, cough, headache, dizziness, SOB, chest pain, palpitations, abdominal pain, constipation, diarrhea, blood in urine or stool. He denies swelling, tenderness, numbness or tingling in his extremities. He has had no pain or bleeding. His appetite is good and he is drinking plenty of fluids. In July his ferritin was 18.  CURRENT TREATMENT: Phlebotomy to maintain hematocrit below 45 %  Aspirin 81 mg by mouth daily  REVIEW OF SYSTEMS: All other 10 point review of systems is negative except for those issues mentioned above.  PAST MEDICAL HISTORY: Past Medical History  Diagnosis Date  . Hypertension   . Hypercholesteremia   . Polycythemia rubra vera 09/02/2011  . HZ (herpes zoster) 09/02/2011   PAST SURGICAL HISTORY: Past Surgical History  Procedure Laterality Date  . Scleral buckle  06/04/2012    Procedure: SCLERAL BUCKLE;  Surgeon: Hayden Pedro, MD;  Location: [place];  Service: Ophthalmology;  Laterality: Left;  Headscope laser  . Gas insertion  06/04/2012    Procedure: INSERTION OF GAS;  Surgeon: Hayden Pedro, MD;  Location: [place];  Service: Ophthalmology;  Laterality: Left;  [id number]   FAMILY HISTORY No family history on file.  GYNECOLOGIC HISTORY:  No LMP for male patient.   SOCIAL HISTORY:  History   Social History  . Marital Status: Single    Spouse Name: N/A    Number of Children: N/A  . Years of Education: N/A   Occupational History  . Not on file.   Social History Main Topics  . Smoking status: Never Smoker   . Smokeless tobacco: Never Used     Comment: never  used tobacco  . Alcohol Use: No  . Drug Use: No  . Sexual Activity: Not on file   Other Topics Concern  . Not on file   Social History Narrative  . No narrative on file   ADVANCED DIRECTIVES: <no information>  HEALTH MAINTENANCE: History  Substance Use Topics  . Smoking status: Never Smoker   . Smokeless tobacco: Never Used     Comment: never used tobacco  . Alcohol Use: No   Colonoscopy: PAP: Bone density: Lipid panel:  No Known Allergies  Current Outpatient Prescriptions  Medication Sig Dispense Refill  . ALPRAZolam (XANAX) 0.5 MG tablet Take 0.25 mg by mouth at bedtime as needed. For sleep      . aspirin EC 81 MG tablet Take 81 mg by mouth daily.      . cholecalciferol (VITAMIN D) 1000 UNITS tablet Take 1,000 Units by mouth daily.        . fish oil-omega-3 fatty acids 1000 MG capsule Take 1 g by mouth daily.       Marland Kitchen lisinopril-hydrochlorothiazide (PRINZIDE,ZESTORETIC) 20-12.5 MG per tablet Take 1 tablet by mouth daily.        . simvastatin (ZOCOR) 20 MG tablet Take 20 mg by mouth at bedtime.         No current facility-administered medications for this visit.   OBJECTIVE: Filed Vitals:   05/09/14 0929  BP: 127/76  Pulse: 89  Temp: 97.8 F (36.6 C)  Resp: 18  Body mass index is 27.29 kg/(m^2). ECOG FS:0 - Asymptomatic Ocular: Sclerae unicteric, pupils equal, round and reactive to light Ear-nose-throat: Oropharynx clear, dentition fair Lymphatic: No cervical or supraclavicular adenopathy Lungs no rales or rhonchi, good excursion bilaterally Heart regular rate and rhythm, no murmur appreciated Abd soft, nontender, positive bowel sounds MSK no focal spinal tenderness, no joint edema Neuro: non-focal, well-oriented, appropriate affect  LAB RESULTS: CMP     Component Value Date/Time   NA 135 06/04/2012 0905   K 3.8 06/04/2012 0905   CL 101 06/04/2012 0905   CO2 21 06/04/2012 0905   GLUCOSE 114* 06/04/2012 0905   BUN 11 06/04/2012 0905   CREATININE 0.76  06/04/2012 0905   CALCIUM 10.0 06/04/2012 0905   PROT 7.3 09/25/2007 0916   ALBUMIN 4.2 09/25/2007 0916   AST 23 09/25/2007 0916   ALT 32 09/25/2007 0916   ALKPHOS 52 09/25/2007 0916   BILITOT 1.4* 09/25/2007 0916   GFRNONAA >90 06/04/2012 0905   GFRAA >90 06/04/2012 0905   No results found for this basename: SPEP, UPEP,  kappa and lambda light chains   Lab Results  Component Value Date   WBC 6.6 05/09/2014   NEUTROABS 4.0 05/09/2014   HGB 16.1 05/09/2014   HCT 47.7 05/09/2014   MCV 84 05/09/2014   PLT 246 05/09/2014   No results found for this basename: LABCA2   No components found with this basename: PGFQM210   No results found for this basename: INR,  in the last 168 hours  STUDIES: No results found.  ASSESSMENT/PLAN: [name] is a 73 year old gentleman with polycythemia vera. He's doing well with this. His Hct is 47.7. We will go ahead and phlebotomize him today.  He will continue taking his Asprin daily.  We will see him back here in 6 weeks for labs and follow-up. He is in agreement with this and knows to call here with any questions or concerns. We can certainly see him sooner if need be.   Eliezer Bottom, NP 05/09/2014 10:08 AM

## 2014-05-09 NOTE — Progress Notes (Signed)
Jesse [name] presents today for phlebotomy per MD orders. Phlebotomy procedure started at 1007 and ended at 1015. 527mL removed. Patient observed for 30 minutes after procedure without any incident. Patient tolerated procedure well. IV needle removed intact.

## 2014-05-09 NOTE — Patient Instructions (Signed)

## 2014-06-20 ENCOUNTER — Ambulatory Visit (HOSPITAL_BASED_OUTPATIENT_CLINIC_OR_DEPARTMENT_OTHER): Payer: Medicare Other

## 2014-06-20 ENCOUNTER — Ambulatory Visit (HOSPITAL_BASED_OUTPATIENT_CLINIC_OR_DEPARTMENT_OTHER): Payer: Medicare Other | Admitting: Hematology & Oncology

## 2014-06-20 ENCOUNTER — Other Ambulatory Visit (HOSPITAL_BASED_OUTPATIENT_CLINIC_OR_DEPARTMENT_OTHER): Payer: Medicare Other | Admitting: Lab

## 2014-06-20 ENCOUNTER — Encounter: Payer: Self-pay | Admitting: Hematology & Oncology

## 2014-06-20 VITALS — BP 119/71 | HR 89 | Temp 97.9°F | Resp 18 | Ht 66.0 in | Wt 166.0 lb

## 2014-06-20 DIAGNOSIS — D45 Polycythemia vera: Secondary | ICD-10-CM

## 2014-06-20 DIAGNOSIS — Z23 Encounter for immunization: Secondary | ICD-10-CM

## 2014-06-20 LAB — COMPREHENSIVE METABOLIC PANEL
ALT: 24 U/L (ref 0–53)
AST: 23 U/L (ref 0–37)
Albumin: 4.3 g/dL (ref 3.5–5.2)
Alkaline Phosphatase: 53 U/L (ref 39–117)
BUN: 13 mg/dL (ref 6–23)
CO2: 28 mEq/L (ref 19–32)
Calcium: 10.5 mg/dL (ref 8.4–10.5)
Chloride: 95 mEq/L — ABNORMAL LOW (ref 96–112)
Creatinine, Ser: 0.88 mg/dL (ref 0.50–1.35)
Glucose, Bld: 123 mg/dL — ABNORMAL HIGH (ref 70–99)
Potassium: 3.8 mEq/L (ref 3.5–5.3)
Sodium: 133 mEq/L — ABNORMAL LOW (ref 135–145)
Total Bilirubin: 1.2 mg/dL (ref 0.2–1.2)
Total Protein: 6.9 g/dL (ref 6.0–8.3)

## 2014-06-20 LAB — CBC WITH DIFFERENTIAL (CANCER CENTER ONLY)
BASO#: 0 10*3/uL (ref 0.0–0.2)
BASO%: 0.2 % (ref 0.0–2.0)
EOS%: 0.5 % (ref 0.0–7.0)
Eosinophils Absolute: 0 10*3/uL (ref 0.0–0.5)
HCT: 45.9 % (ref 38.7–49.9)
HGB: 15.3 g/dL (ref 13.0–17.1)
LYMPH#: 1.7 10*3/uL (ref 0.9–3.3)
LYMPH%: 26 % (ref 14.0–48.0)
MCH: 28.3 pg (ref 28.0–33.4)
MCHC: 33.3 g/dL (ref 32.0–35.9)
MCV: 85 fL (ref 82–98)
MONO#: 0.9 10*3/uL (ref 0.1–0.9)
MONO%: 12.9 % (ref 0.0–13.0)
NEUT#: 4 10*3/uL (ref 1.5–6.5)
NEUT%: 60.4 % (ref 40.0–80.0)
Platelets: 284 10*3/uL (ref 145–400)
RBC: 5.41 10*6/uL (ref 4.20–5.70)
RDW: 15 % (ref 11.1–15.7)
WBC: 6.7 10*3/uL (ref 4.0–10.0)

## 2014-06-20 LAB — FERRITIN CHCC: Ferritin: 17 ng/ml — ABNORMAL LOW (ref 22–316)

## 2014-06-20 MED ORDER — INFLUENZA VAC SPLIT QUAD 0.5 ML IM SUSY
0.5000 mL | PREFILLED_SYRINGE | Freq: Once | INTRAMUSCULAR | Status: AC
  Administered 2014-06-20: 0.5 mL via INTRAMUSCULAR
  Filled 2014-06-20: qty 0.5

## 2014-06-20 NOTE — Patient Instructions (Signed)

## 2014-06-20 NOTE — Progress Notes (Signed)
Jesse [name] presents today for phlebotomy per MD orders. Phlebotomy procedure started at 1200 and ended at 1215. 500 ml  removed. Patient observed for 30 minutes after procedure without any incident. Patient tolerated procedure well. IV needle removed intact.

## 2014-06-20 NOTE — Progress Notes (Signed)
  Hematology and Oncology Follow Up Visit  Jesse [name] [id number] [birth year] 73 y.o. 06/20/2014   Principle Diagnosis:   Polycythemia vera  Current Therapy:    Phlebotomy to maintain hematocrit below 45 %  Aspirin 81 mg by mouth daily     Interim History:  Mr.  Hinnenkamp is back for followup. He is doing quite well. They will I saw him back in July. He's had no problems with fever weakness. She had no headache. He take his aspirin.  Is doing a little cough.  He's had no bleeding. No change in bowel or bladder habits.  Medications: Current outpatient prescriptions:ALPRAZolam (XANAX) 0.5 MG tablet, Take 0.25 mg by mouth at bedtime as needed. For sleep, Disp: , Rfl: ;  aspirin EC 81 MG tablet, Take 81 mg by mouth daily., Disp: , Rfl: ;  cholecalciferol (VITAMIN D) 1000 UNITS tablet, Take 1,000 Units by mouth daily.  , Disp: , Rfl: ;  fish oil-omega-3 fatty acids 1000 MG capsule, Take 1 g by mouth daily. , Disp: , Rfl:  lisinopril-hydrochlorothiazide (PRINZIDE,ZESTORETIC) 20-12.5 MG per tablet, Take 1 tablet by mouth daily.  , Disp: , Rfl: ;  simvastatin (ZOCOR) 20 MG tablet, Take 20 mg by mouth at bedtime.  , Disp: , Rfl:   Allergies: No Known Allergies  Past Medical History, Surgical history, Social history, and Family History were reviewed and updated.  Review of Systems: As above  Physical Exam:  height is 5\' 6"  (1.676 m) and weight is 166 lb (75.297 kg). His oral temperature is 97.9 F (36.6 C). His blood pressure is 119/71 and his pulse is 89. His respiration is 18.   Well-developed well-nourished white male. Head and neck exam shows no ocular or oral lesions. Has no palpable cervical or supraclavicular lymph nodes. Lungs are clear. Cardiac exam regular in rhythm with no murmurs, rubs or bruits. Abdomen soft. Has good bowel sounds. There is no fluid wave is no palpable liver or spleen tip. Extremities shows no clubbing, cyanosis or edema. Neurological exam shows no focal  neurological deficits. Skin exam no rashes, ecchymosis or petechia.  Lab Results  Component Value Date   WBC 6.7 06/20/2014   HGB 15.3 06/20/2014   HCT 45.9 06/20/2014   MCV 85 06/20/2014   PLT 284 06/20/2014     Chemistry      Component Value Date/Time   NA 132* 05/09/2014 0907   K 3.9 05/09/2014 0907   CL 97 05/09/2014 0907   CO2 25 05/09/2014 0907   BUN 11 05/09/2014 0907   CREATININE 0.83 05/09/2014 0907      Component Value Date/Time   CALCIUM 10.1 05/09/2014 0907   ALKPHOS 47 05/09/2014 0907   AST 22 05/09/2014 0907   ALT 25 05/09/2014 0907   BILITOT 1.2 05/09/2014 0907         Impression and Plan: Mr. Brueckner is 73 year old gentleman with polycythemia vera. We will go ahead and phlebotomize him today. I think if we do this, we can get him through the holidays.  He does well with phlebotomies.  He has had no neurologic complications so far from the polycythemia.   Volanda Napoleon, MD 9/28/201511:51 AM

## 2014-09-26 ENCOUNTER — Ambulatory Visit: Payer: Medicare Other

## 2014-09-26 ENCOUNTER — Other Ambulatory Visit (HOSPITAL_BASED_OUTPATIENT_CLINIC_OR_DEPARTMENT_OTHER): Payer: Medicare Other | Admitting: Lab

## 2014-09-26 ENCOUNTER — Encounter: Payer: Self-pay | Admitting: Family

## 2014-09-26 ENCOUNTER — Ambulatory Visit (HOSPITAL_BASED_OUTPATIENT_CLINIC_OR_DEPARTMENT_OTHER): Payer: Medicare Other | Admitting: Family

## 2014-09-26 DIAGNOSIS — F418 Other specified anxiety disorders: Secondary | ICD-10-CM | POA: Diagnosis not present

## 2014-09-26 DIAGNOSIS — D45 Polycythemia vera: Secondary | ICD-10-CM | POA: Diagnosis not present

## 2014-09-26 LAB — CBC WITH DIFFERENTIAL (CANCER CENTER ONLY)
BASO#: 0 10*3/uL (ref 0.0–0.2)
BASO%: 0.2 % (ref 0.0–2.0)
EOS%: 0.1 % (ref 0.0–7.0)
Eosinophils Absolute: 0 10*3/uL (ref 0.0–0.5)
HCT: 43.5 % (ref 38.7–49.9)
HGB: 14.6 g/dL (ref 13.0–17.1)
LYMPH#: 1.7 10*3/uL (ref 0.9–3.3)
LYMPH%: 19.1 % (ref 14.0–48.0)
MCH: 27 pg — ABNORMAL LOW (ref 28.0–33.4)
MCHC: 33.6 g/dL (ref 32.0–35.9)
MCV: 80 fL — ABNORMAL LOW (ref 82–98)
MONO#: 0.9 10*3/uL (ref 0.1–0.9)
MONO%: 10.4 % (ref 0.0–13.0)
NEUT#: 6.2 10*3/uL (ref 1.5–6.5)
NEUT%: 70.2 % (ref 40.0–80.0)
Platelets: 266 10*3/uL (ref 145–400)
RBC: 5.41 10*6/uL (ref 4.20–5.70)
RDW: 16.8 % — ABNORMAL HIGH (ref 11.1–15.7)
WBC: 8.9 10*3/uL (ref 4.0–10.0)

## 2014-09-26 LAB — COMPREHENSIVE METABOLIC PANEL
ALT: 21 U/L (ref 0–53)
AST: 21 U/L (ref 0–37)
Albumin: 4.2 g/dL (ref 3.5–5.2)
Alkaline Phosphatase: 48 U/L (ref 39–117)
BUN: 10 mg/dL (ref 6–23)
CO2: 25 mEq/L (ref 19–32)
Calcium: 10.2 mg/dL (ref 8.4–10.5)
Chloride: 91 mEq/L — ABNORMAL LOW (ref 96–112)
Creatinine, Ser: 0.76 mg/dL (ref 0.50–1.35)
Glucose, Bld: 105 mg/dL — ABNORMAL HIGH (ref 70–99)
Potassium: 3.7 mEq/L (ref 3.5–5.3)
Sodium: 125 mEq/L — ABNORMAL LOW (ref 135–145)
Total Bilirubin: 1.7 mg/dL — ABNORMAL HIGH (ref 0.2–1.2)
Total Protein: 6.7 g/dL (ref 6.0–8.3)

## 2014-09-26 MED ORDER — ESCITALOPRAM OXALATE 10 MG PO TABS: 10.0000 mg | ORAL_TABLET | Freq: Every day | ORAL | Status: DC

## 2014-09-26 NOTE — Progress Notes (Signed)
No phlebotomy today per dr. ennever 

## 2014-09-26 NOTE — Progress Notes (Signed)
[place]  [phone] [phone]  ID: Jesse [name] OB: 1941-04-03 MR#: [id number] PJA#:[id number] Patient Care Team: Jesse Blade, MD as PCP - General  DIAGNOSIS:  Polycythemia vera  INTERVAL HISTORY: Jesse [name] is back today for a follow-up. He is very stressed. He is hypertensive. I retook his BP manually and it was 140/80. He states that he is under a lot of stress with his sister who has schizophrenia and also with his commercial property. He is not resting.   He denies fever, chills, n/v, rash, cough, headache, dizziness, SOB, chest pain, palpitations, abdominal pain, constipation, diarrhea, blood in urine or stool. No pain or bleeding. No swelling, tenderness, numbness or tingling in his extremities.   His appetite is ok and he is drinking plenty of fluids.  Today his Hct is 43.5 MCV 80. His last phlebotomy was in September.   CURRENT TREATMENT: Phlebotomy to maintain hematocrit below 45 %  Aspirin 81 mg by mouth daily  REVIEW OF SYSTEMS: All other 10 point review of systems is negative except for those issues mentioned above.  PAST MEDICAL HISTORY: Past Medical History  Diagnosis Date  . Hypertension   . Hypercholesteremia   . Polycythemia rubra vera 09/02/2011  . HZ (herpes zoster) 09/02/2011   PAST SURGICAL HISTORY: Past Surgical History  Procedure Laterality Date  . Scleral buckle  06/04/2012    Procedure: SCLERAL BUCKLE;  Surgeon: Jesse Pedro, MD;  Location: [facility];  Service: Ophthalmology;  Laterality: Left;  Headscope laser  . Gas insertion  06/04/2012    Procedure: INSERTION OF GAS;  Surgeon: Jesse Pedro, MD;  Location: [facility];  Service: Ophthalmology;  Laterality: Left;  C3F8   FAMILY HISTORY No family history on file.  GYNECOLOGIC HISTORY:  No LMP for male patient.   SOCIAL HISTORY:  History   Social History  . Marital Status: Single    Spouse Name: N/A    Number of Children: N/A  . Years of Education: N/A    Occupational History  . Not on file.   Social History Main Topics  . Smoking status: Never Smoker   . Smokeless tobacco: Never Used     Comment: never used tobacco  . Alcohol Use: No  . Drug Use: No  . Sexual Activity: Not on file   Other Topics Concern  . Not on file   Social History Narrative   ADVANCED DIRECTIVES: <no information>  HEALTH MAINTENANCE: History  Substance Use Topics  . Smoking status: Never Smoker   . Smokeless tobacco: Never Used     Comment: never used tobacco  . Alcohol Use: No   Colonoscopy: PAP: Bone density: Lipid panel:  No Known Allergies  Current Outpatient Prescriptions  Medication Sig Dispense Refill  . ALPRAZolam (XANAX) 0.5 MG tablet Take 0.25 mg by mouth at bedtime as needed. For sleep    . aspirin EC 81 MG tablet Take 81 mg by mouth daily.    . cholecalciferol (VITAMIN D) 1000 UNITS tablet Take 1,000 Units by mouth daily.      . fish oil-omega-3 fatty acids 1000 MG capsule Take 1 g by mouth daily.     Marland Kitchen lisinopril-hydrochlorothiazide (PRINZIDE,ZESTORETIC) 20-12.5 MG per tablet Take 1 tablet by mouth daily.      . simvastatin (ZOCOR) 20 MG tablet Take 20 mg by mouth at bedtime.      Marland Kitchen escitalopram (LEXAPRO) 10 MG tablet Take 1 tablet (10 mg total) by mouth daily. 90 tablet 0  No current facility-administered medications for this visit.   OBJECTIVE: Filed Vitals:   09/26/14 1205  BP: 164/73  Pulse: 89  Temp:   Resp:    There is no weight on file to calculate BMI. ECOG FS:0 - Asymptomatic Ocular: Sclerae unicteric, pupils equal, round and reactive to light Ear-nose-throat: Oropharynx clear, dentition fair Lymphatic: No cervical or supraclavicular adenopathy Lungs no rales or rhonchi, good excursion bilaterally Heart regular rate and rhythm, no murmur appreciated Abd soft, nontender, positive bowel sounds MSK no focal spinal tenderness, no joint edema Neuro: non-focal, well-oriented, appropriate affect  LAB RESULTS: CMP      Component Value Date/Time   NA 133* 06/20/2014 1020   K 3.8 06/20/2014 1020   CL 95* 06/20/2014 1020   CO2 28 06/20/2014 1020   GLUCOSE 123* 06/20/2014 1020   BUN 13 06/20/2014 1020   CREATININE 0.88 06/20/2014 1020   CALCIUM 10.5 06/20/2014 1020   PROT 6.9 06/20/2014 1020   ALBUMIN 4.3 06/20/2014 1020   AST 23 06/20/2014 1020   ALT 24 06/20/2014 1020   ALKPHOS 53 06/20/2014 1020   BILITOT 1.2 06/20/2014 1020   GFRNONAA >90 06/04/2012 0905   GFRAA >90 06/04/2012 0905   No results found for: SPEP Lab Results  Component Value Date   WBC 8.9 09/26/2014   NEUTROABS 6.2 09/26/2014   HGB 14.6 09/26/2014   HCT 43.5 09/26/2014   MCV 80* 09/26/2014   PLT 266 09/26/2014   No results found for: LABCA2 No components found for: QDIYM415 No results for input(s): INR in the last 168 hours.  STUDIES: No results found.  ASSESSMENT/PLAN: Jesse [name] is a 74 year old gentleman with polycythemia vera. He's doing well with this. His Hct today is 43.5. He does not need phlebotomized today.  He will continue taking his Asprin daily.  I also gave him a prescription for a 3 month supply of Lexapro. Hopefully, this will help with his anxiety and depression. He is going to follow-up with his primary care physician for management of this.  We will see him back here in 3 months for labs and follow-up. He knows to call here with any questions or concerns and to go to the ED in the event of an emergency. We can certainly see him sooner if need be.    Jesse Bottom, NP 09/26/2014 12:49 PM

## 2014-09-27 ENCOUNTER — Ambulatory Visit (HOSPITAL_BASED_OUTPATIENT_CLINIC_OR_DEPARTMENT_OTHER): Payer: Medicare Other

## 2014-09-27 ENCOUNTER — Other Ambulatory Visit: Payer: Self-pay | Admitting: Family

## 2014-09-27 VITALS — BP 106/60 | HR 70 | Temp 97.9°F | Resp 16

## 2014-09-27 DIAGNOSIS — D45 Polycythemia vera: Secondary | ICD-10-CM

## 2014-09-27 MED ORDER — SODIUM CHLORIDE 0.9 % IV SOLN
1000.0000 mL | Freq: Once | INTRAVENOUS | Status: DC
  Administered 2014-09-27: 1000 mL via INTRAVENOUS

## 2014-09-27 NOTE — Patient Instructions (Signed)
Dehydration, Adult Dehydration is when you lose more fluids from the body than you take in. Vital organs like the kidneys, brain, and heart cannot function without a proper amount of fluids and salt. Any loss of fluids from the body can cause dehydration.  CAUSES   Vomiting.  Diarrhea.  Excessive sweating.  Excessive urine output.  Fever. SYMPTOMS  Mild dehydration  Thirst.  Dry lips.  Slightly dry mouth. Moderate dehydration  Very dry mouth.  Sunken eyes.  Skin does not bounce back quickly when lightly pinched and released.  Dark urine and decreased urine production.  Decreased tear production.  Headache. Severe dehydration  Very dry mouth.  Extreme thirst.  Rapid, weak pulse (more than 100 beats per minute at rest).  Cold hands and feet.  Not able to sweat in spite of heat and temperature.  Rapid breathing.  Blue lips.  Confusion and lethargy.  Difficulty being awakened.  Minimal urine production.  No tears. DIAGNOSIS  Your caregiver will diagnose dehydration based on your symptoms and your exam. Blood and urine tests will help confirm the diagnosis. The diagnostic evaluation should also identify the cause of dehydration. TREATMENT  Treatment of mild or moderate dehydration can often be done at home by increasing the amount of fluids that you drink. It is best to drink small amounts of fluid more often. Drinking too much at one time can make vomiting worse. Refer to the home care instructions below. Severe dehydration needs to be treated at the hospital where you will probably be given intravenous (IV) fluids that contain water and electrolytes. HOME CARE INSTRUCTIONS   Ask your caregiver about specific rehydration instructions.  Drink enough fluids to keep your urine clear or pale yellow.  Drink small amounts frequently if you have nausea and vomiting.  Eat as you normally do.  Avoid:  Foods or drinks high in sugar.  Carbonated  drinks.  Juice.  Extremely hot or cold fluids.  Drinks with caffeine.  Fatty, greasy foods.  Alcohol.  Tobacco.  Overeating.  Gelatin desserts.  Wash your hands well to avoid spreading bacteria and viruses.  Only take over-the-counter or prescription medicines for pain, discomfort, or fever as directed by your caregiver.  Ask your caregiver if you should continue all prescribed and over-the-counter medicines.  Keep all follow-up appointments with your caregiver. SEEK MEDICAL CARE IF:  You have abdominal pain and it increases or stays in one area (localizes).  You have a rash, stiff neck, or severe headache.  You are irritable, sleepy, or difficult to awaken.  You are weak, dizzy, or extremely thirsty. SEEK IMMEDIATE MEDICAL CARE IF:   You are unable to keep fluids down or you get worse despite treatment.  You have frequent episodes of vomiting or diarrhea.  You have blood or green matter (bile) in your vomit.  You have blood in your stool or your stool looks black and tarry.  You have not urinated in 6 to 8 hours, or you have only urinated a small amount of very dark urine.  You have a fever.  You faint. MAKE SURE YOU:   Understand these instructions.  Will watch your condition.  Will get help right away if you are not doing well or get worse. Document Released: 09/09/2005 Document Revised: 12/02/2011 Document Reviewed: 04/29/2011 [employer] Patient Information 2015 [employer]. This information is not intended to replace advice given to you by your health care provider. Make sure you discuss any questions you have with your health care   provider.  

## 2014-09-27 NOTE — Progress Notes (Signed)
Pt's sodium is 125 and chloride 91, we will give him fluids today.

## 2014-10-03 ENCOUNTER — Other Ambulatory Visit (HOSPITAL_BASED_OUTPATIENT_CLINIC_OR_DEPARTMENT_OTHER): Payer: Medicare Other | Admitting: Lab

## 2014-10-03 ENCOUNTER — Telehealth: Payer: Self-pay | Admitting: *Deleted

## 2014-10-03 ENCOUNTER — Other Ambulatory Visit: Payer: Self-pay | Admitting: Family

## 2014-10-03 DIAGNOSIS — D45 Polycythemia vera: Secondary | ICD-10-CM

## 2014-10-03 LAB — CMP (CANCER CENTER ONLY)
ALT(SGPT): 30 U/L (ref 10–47)
AST: 26 U/L (ref 11–38)
Albumin: 4.1 g/dL (ref 3.3–5.5)
Alkaline Phosphatase: 53 U/L (ref 26–84)
BUN, Bld: 7 mg/dL (ref 7–22)
CO2: 28 mEq/L (ref 18–33)
Calcium: 10.3 mg/dL (ref 8.0–10.3)
Chloride: 88 mEq/L — ABNORMAL LOW (ref 98–108)
Creat: 1 mg/dl (ref 0.6–1.2)
Glucose, Bld: 92 mg/dL (ref 73–118)
Potassium: 3.4 mEq/L (ref 3.3–4.7)
Sodium: 125 mEq/L — ABNORMAL LOW (ref 128–145)
Total Bilirubin: 1.8 mg/dl — ABNORMAL HIGH (ref 0.20–1.60)
Total Protein: 7 g/dL (ref 6.4–8.1)

## 2014-10-03 LAB — CBC WITH DIFFERENTIAL (CANCER CENTER ONLY)
BASO#: 0 10*3/uL (ref 0.0–0.2)
BASO%: 0.2 % (ref 0.0–2.0)
EOS%: 0.1 % (ref 0.0–7.0)
Eosinophils Absolute: 0 10*3/uL (ref 0.0–0.5)
HCT: 44.6 % (ref 38.7–49.9)
HGB: 15.2 g/dL (ref 13.0–17.1)
LYMPH#: 1.7 10*3/uL (ref 0.9–3.3)
LYMPH%: 19 % (ref 14.0–48.0)
MCH: 27 pg — ABNORMAL LOW (ref 28.0–33.4)
MCHC: 34.1 g/dL (ref 32.0–35.9)
MCV: 79 fL — ABNORMAL LOW (ref 82–98)
MONO#: 1 10*3/uL — ABNORMAL HIGH (ref 0.1–0.9)
MONO%: 10.9 % (ref 0.0–13.0)
NEUT#: 6.4 10*3/uL (ref 1.5–6.5)
NEUT%: 69.8 % (ref 40.0–80.0)
Platelets: 295 10*3/uL (ref 145–400)
RBC: 5.62 10*6/uL (ref 4.20–5.70)
RDW: 17 % — ABNORMAL HIGH (ref 11.1–15.7)
WBC: 9.1 10*3/uL (ref 4.0–10.0)

## 2014-10-03 NOTE — Telephone Encounter (Signed)
Patient called stating that since starting Lexapro he has had multiple symptoms. No appetite, not sleeping, dry mouth, drowsiness and nervousness. Spoke to Judson Roch, and we will have him come in for a lab draw to check his sodium level. Called patient and appointment made.

## 2014-10-04 LAB — IRON AND TIBC CHCC
%SAT: 9 % — ABNORMAL LOW (ref 20–55)
Iron: 38 ug/dL — ABNORMAL LOW (ref 42–163)
TIBC: 408 ug/dL (ref 202–409)
UIBC: 369 ug/dL (ref 117–376)

## 2014-10-04 LAB — FERRITIN CHCC: Ferritin: 20 ng/ml — ABNORMAL LOW (ref 22–316)

## 2014-10-05 DIAGNOSIS — F329 Major depressive disorder, single episode, unspecified: Secondary | ICD-10-CM | POA: Diagnosis not present

## 2014-10-15 ENCOUNTER — Emergency Department (HOSPITAL_COMMUNITY)
Admission: EM | Admit: 2014-10-15 | Discharge: 2014-10-15 | Disposition: A | Discharge status: Home / Self Care | Payer: Medicare Other | Attending: Emergency Medicine | Admitting: Emergency Medicine

## 2014-10-15 ENCOUNTER — Encounter (HOSPITAL_COMMUNITY): Payer: Self-pay | Admitting: Nurse Practitioner

## 2014-10-15 ENCOUNTER — Emergency Department (HOSPITAL_COMMUNITY): Payer: Medicare Other

## 2014-10-15 DIAGNOSIS — R251 Tremor, unspecified: Secondary | ICD-10-CM | POA: Diagnosis not present

## 2014-10-15 DIAGNOSIS — I10 Essential (primary) hypertension: Secondary | ICD-10-CM | POA: Diagnosis not present

## 2014-10-15 DIAGNOSIS — D45 Polycythemia vera: Secondary | ICD-10-CM | POA: Diagnosis not present

## 2014-10-15 DIAGNOSIS — Z79899 Other long term (current) drug therapy: Secondary | ICD-10-CM | POA: Diagnosis not present

## 2014-10-15 DIAGNOSIS — Z7982 Long term (current) use of aspirin: Secondary | ICD-10-CM | POA: Insufficient documentation

## 2014-10-15 DIAGNOSIS — J9811 Atelectasis: Secondary | ICD-10-CM | POA: Diagnosis not present

## 2014-10-15 DIAGNOSIS — G4700 Insomnia, unspecified: Secondary | ICD-10-CM | POA: Diagnosis not present

## 2014-10-15 DIAGNOSIS — Z8619 Personal history of other infectious and parasitic diseases: Secondary | ICD-10-CM | POA: Insufficient documentation

## 2014-10-15 DIAGNOSIS — E780 Pure hypercholesterolemia: Secondary | ICD-10-CM | POA: Insufficient documentation

## 2014-10-15 DIAGNOSIS — R5383 Other fatigue: Secondary | ICD-10-CM | POA: Diagnosis present

## 2014-10-15 DIAGNOSIS — F329 Major depressive disorder, single episode, unspecified: Secondary | ICD-10-CM | POA: Insufficient documentation

## 2014-10-15 DIAGNOSIS — R531 Weakness: Secondary | ICD-10-CM | POA: Insufficient documentation

## 2014-10-15 DIAGNOSIS — E871 Hypo-osmolality and hyponatremia: Secondary | ICD-10-CM | POA: Diagnosis not present

## 2014-10-15 DIAGNOSIS — R4182 Altered mental status, unspecified: Secondary | ICD-10-CM | POA: Diagnosis not present

## 2014-10-15 HISTORY — DX: Depression, unspecified: F32.A

## 2014-10-15 HISTORY — DX: Major depressive disorder, single episode, unspecified: F32.9

## 2014-10-15 LAB — CBC WITH DIFFERENTIAL/PLATELET
Basophils Absolute: 0 10*3/uL (ref 0.0–0.1)
Basophils Relative: 0 % (ref 0–1)
Eosinophils Absolute: 0.1 10*3/uL (ref 0.0–0.7)
Eosinophils Relative: 1 % (ref 0–5)
HCT: 43 % (ref 39.0–52.0)
Hemoglobin: 14.6 g/dL (ref 13.0–17.0)
Lymphocytes Relative: 22 % (ref 12–46)
Lymphs Abs: 2.1 10*3/uL (ref 0.7–4.0)
MCH: 26.8 pg (ref 26.0–34.0)
MCHC: 34 g/dL (ref 30.0–36.0)
MCV: 79 fL (ref 78.0–100.0)
Monocytes Absolute: 1.5 10*3/uL — ABNORMAL HIGH (ref 0.1–1.0)
Monocytes Relative: 16 % — ABNORMAL HIGH (ref 3–12)
Neutro Abs: 5.9 10*3/uL (ref 1.7–7.7)
Neutrophils Relative %: 61 % (ref 43–77)
Platelets: 321 10*3/uL (ref 150–400)
RBC: 5.44 MIL/uL (ref 4.22–5.81)
RDW: 17.1 % — ABNORMAL HIGH (ref 11.5–15.5)
WBC: 9.6 10*3/uL (ref 4.0–10.5)

## 2014-10-15 LAB — URINALYSIS, ROUTINE W REFLEX MICROSCOPIC
Bilirubin Urine: NEGATIVE
Glucose, UA: NEGATIVE mg/dL
Hgb urine dipstick: NEGATIVE
Ketones, ur: NEGATIVE mg/dL
Leukocytes, UA: NEGATIVE
Nitrite: NEGATIVE
Protein, ur: NEGATIVE mg/dL
Specific Gravity, Urine: 1.005 (ref 1.005–1.030)
Urobilinogen, UA: 0.2 mg/dL (ref 0.0–1.0)
pH: 7.5 (ref 5.0–8.0)

## 2014-10-15 LAB — COMPREHENSIVE METABOLIC PANEL
ALT: 30 U/L (ref 0–53)
AST: 30 U/L (ref 0–37)
Albumin: 3.7 g/dL (ref 3.5–5.2)
Alkaline Phosphatase: 57 U/L (ref 39–117)
Anion gap: 8 (ref 5–15)
BUN: 9 mg/dL (ref 6–23)
CO2: 25 mmol/L (ref 19–32)
Calcium: 9.8 mg/dL (ref 8.4–10.5)
Chloride: 93 mmol/L — ABNORMAL LOW (ref 96–112)
Creatinine, Ser: 0.85 mg/dL (ref 0.50–1.35)
GFR calc Af Amer: >90 mL/min (ref >90)
GFR calc non Af Amer: 85 mL/min — ABNORMAL LOW (ref >90)
Glucose, Bld: 85 mg/dL (ref 70–99)
Potassium: 3.7 mmol/L (ref 3.5–5.1)
Sodium: 126 mmol/L — ABNORMAL LOW (ref 135–145)
Total Bilirubin: 1.4 mg/dL — ABNORMAL HIGH (ref 0.3–1.2)
Total Protein: 6.6 g/dL (ref 6.0–8.3)

## 2014-10-15 MED ORDER — SODIUM CHLORIDE 0.9 % IV BOLUS (SEPSIS)
1000.0000 mL | Freq: Once | INTRAVENOUS | Status: AC
  Administered 2014-10-15: 1000 mL via INTRAVENOUS

## 2014-10-15 NOTE — ED Notes (Signed)
He states he has been unable to sleep since starting sertraline 4 days ago. He says he has not slept at all and he feels " exhausted and run down." no pain. He is concerned that he is dehydrated because he has not been eating well

## 2014-10-15 NOTE — Discharge Instructions (Signed)
Insomnia Insomnia is frequent trouble falling and/or staying asleep. Insomnia can be a long term problem or a short term problem. Both are common. Insomnia can be a short term problem when the wakefulness is related to a certain stress or worry. Long term insomnia is often related to ongoing stress during waking hours and/or poor sleeping habits. Overtime, sleep deprivation itself can make the problem worse. Every little thing feels more severe because you are overtired and your ability to cope is decreased. CAUSES   Stress, anxiety, and depression.  Poor sleeping habits.  Distractions such as TV in the bedroom.  Naps close to bedtime.  Engaging in emotionally charged conversations before bed.  Technical reading before sleep.  Alcohol and other sedatives. They may make the problem worse. They can hurt normal sleep patterns and normal dream activity.  Stimulants such as caffeine for several hours prior to bedtime.  Pain syndromes and shortness of breath can cause insomnia.  Exercise late at night.  Changing time zones may cause sleeping problems (jet lag). It is sometimes helpful to have someone observe your sleeping patterns. They should look for periods of not breathing during the night (sleep apnea). They should also look to see how long those periods last. If you live alone or observers are uncertain, you can also be observed at a sleep clinic where your sleep patterns will be professionally monitored. Sleep apnea requires a checkup and treatment. Give your caregivers your medical history. Give your caregivers observations your family has made about your sleep.  SYMPTOMS   Not feeling rested in the morning.  Anxiety and restlessness at bedtime.  Difficulty falling and staying asleep. TREATMENT   Your caregiver may prescribe treatment for an underlying medical disorders. Your caregiver can give advice or help if you are using alcohol or other drugs for self-medication. Treatment  of underlying problems will usually eliminate insomnia problems.  Medications can be prescribed for short time use. They are generally not recommended for lengthy use.  Over-the-counter sleep medicines are not recommended for lengthy use. They can be habit forming.  You can promote easier sleeping by making lifestyle changes such as:  Using relaxation techniques that help with breathing and reduce muscle tension.  Exercising earlier in the day.  Changing your diet and the time of your last meal. No night time snacks.  Establish a regular time to go to bed.  Counseling can help with stressful problems and worry.  Soothing music and white noise may be helpful if there are background noises you cannot remove.  Stop tedious detailed work at least one hour before bedtime. HOME CARE INSTRUCTIONS   Keep a diary. Inform your caregiver about your progress. This includes any medication side effects. See your caregiver regularly. Take note of:  Times when you are asleep.  Times when you are awake during the night.  The quality of your sleep.  How you feel the next day. This information will help your caregiver care for you.  Get out of bed if you are still awake after 15 minutes. Read or do some quiet activity. Keep the lights down. Wait until you feel sleepy and go back to bed.  Keep regular sleeping and waking hours. Avoid naps.  Exercise regularly.  Avoid distractions at bedtime. Distractions include watching television or engaging in any intense or detailed activity like attempting to balance the household checkbook.  Develop a bedtime ritual. Keep a familiar routine of bathing, brushing your teeth, climbing into bed at the same  time each night, listening to soothing music. Routines increase the success of falling to sleep faster.  Use relaxation techniques. This can be using breathing and muscle tension release routines. It can also include visualizing peaceful scenes. You can  also help control troubling or intruding thoughts by keeping your mind occupied with boring or repetitive thoughts like the old concept of counting sheep. You can make it more creative like imagining planting one beautiful flower after another in your backyard garden.  During your day, work to eliminate stress. When this is not possible use some of the previous suggestions to help reduce the anxiety that accompanies stressful situations. MAKE SURE YOU:   Understand these instructions.  Will watch your condition.  Will get help right away if you are not doing well or get worse. Document Released: 09/06/2000 Document Revised: 12/02/2011 Document Reviewed: 10/07/2007 [facility] Patient Information 2015 [place], [place]. This information is not intended to replace advice given to you by your health care provider. Make sure you discuss any questions you have with your health care provider.  Hyponatremia  Hyponatremia is when the amount of salt (sodium) in your blood is too low. When sodium levels are low, your cells will absorb extra water and swell. The swelling happens throughout the body, but it mostly affects the brain. Severe brain swelling (cerebral edema), seizures, or coma can happen.  CAUSES   Heart, kidney, or liver problems.  Thyroid problems.  Adrenal gland problems.  Severe vomiting and diarrhea.  Certain medicines or illegal drugs.  Dehydration.  Drinking too much water.  Low-sodium diet. SYMPTOMS   Nausea and vomiting.  Confusion.  Lethargy.  Agitation.  Headache.  Twitching or shaking (seizures).  Unconsciousness.  Appetite loss.  Muscle weakness and cramping. DIAGNOSIS  Hyponatremia is identified by a simple blood test. Your caregiver will perform a history and physical exam to try to find the cause and type of hyponatremia. Other tests may be needed to measure the amount of sodium in your blood and urine. TREATMENT  Treatment will depend on the cause.     Fluids may be given through the vein (IV).  Medicines may be used to correct the sodium imbalance. If medicines are causing the problem, they will need to be adjusted.  Water or fluid intake may be restricted to restore proper balance. The speed of correcting the sodium problem is very important. If the problem is corrected too fast, nerve damage (sometimes unchangeable) can happen. HOME CARE INSTRUCTIONS   Only take medicines as directed by your caregiver. Many medicines can make hyponatremia worse. Discuss all your medicines with your caregiver.  Carefully follow any recommended diet, including any fluid restrictions.  You may be asked to repeat lab tests. Follow these directions.  Avoid alcohol and recreational drugs. SEEK MEDICAL CARE IF:   You develop worsening nausea, fatigue, headache, confusion, or weakness.  Your original hyponatremia symptoms return.  You have problems following the recommended diet. SEEK IMMEDIATE MEDICAL CARE IF:   You have a seizure.  You faint.  You have ongoing diarrhea or vomiting. MAKE SURE YOU:   Understand these instructions.  Will watch your condition.  Will get help right away if you are not doing well or get worse. Document Released: 08/30/2002 Document Revised: 12/02/2011 Document Reviewed: 02/24/2011 [facility] Patient Information 2015 [place], [place]. This information is not intended to replace advice given to you by your health care provider. Make sure you discuss any questions you have with your health care provider.

## 2014-10-15 NOTE — ED Notes (Signed)
PT returned from xray

## 2014-10-15 NOTE — ED Provider Notes (Signed)
CSN: [id number]     Arrival date & time 10/15/14  1743 History   First MD Initiated Contact with Patient 10/15/14 1817     Chief Complaint  Patient presents with  . Fatigue     (Consider location/radiation/quality/duration/timing/severity/associated sxs/prior Treatment) HPI Comments: The patient reports 4 days of insomnia since starting sertraline. He complains of an episode of generalized leg weakness and episode of arm shaking this afternoon.   He denies chest pain, shortness of breath, numbness, focal weakness.  He's had normal urine output.       Past Medical History  Diagnosis Date  . Hypertension   . Hypercholesteremia   . Polycythemia rubra vera 09/02/2011  . HZ (herpes zoster) 09/02/2011  . Depression    Past Surgical History  Procedure Laterality Date  . Scleral buckle  06/04/2012    Procedure: SCLERAL BUCKLE;  Surgeon: Hayden Pedro, MD;  Location: [facility];  Service: Ophthalmology;  Laterality: Left;  Headscope laser  . Gas insertion  06/04/2012    Procedure: INSERTION OF GAS;  Surgeon: Hayden Pedro, MD;  Location: [facility];  Service: Ophthalmology;  Laterality: Left;  C3F8   History reviewed. No pertinent family history. History  Substance Use Topics  . Smoking status: Never Smoker   . Smokeless tobacco: Never Used     Comment: never used tobacco  . Alcohol Use: No    Review of Systems  Constitutional: Negative for fever, activity change, appetite change and fatigue.  HENT: Negative for congestion, facial swelling, rhinorrhea and trouble swallowing.   Eyes: Negative for photophobia and pain.  Respiratory: Negative for cough, chest tightness and shortness of breath.   Cardiovascular: Negative for chest pain and leg swelling.  Gastrointestinal: Negative for nausea, vomiting, abdominal pain, diarrhea and constipation.  Endocrine: Negative for polydipsia and polyuria.  Genitourinary: Negative for dysuria, urgency, decreased urine volume and difficulty urinating.   Musculoskeletal: Negative for back pain and gait problem.  Skin: Negative for color change, rash and wound.  Allergic/Immunologic: Negative for immunocompromised state.  Neurological: Positive for tremors and weakness. Negative for dizziness, facial asymmetry, speech difficulty, numbness and headaches.       Insomnia   Psychiatric/Behavioral: Negative for confusion, decreased concentration and agitation.      Allergies  Review of patient's allergies indicates no known allergies.  Home Medications   Prior to Admission medications   Medication Sig Start Date End Date Taking? Authorizing Provider  ALPRAZolam [name]) 0.5 MG tablet Take 0.25 mg by mouth at bedtime as needed. For sleep   Yes Historical Provider, MD  aspirin EC 81 MG tablet Take 81 mg by mouth daily.   Yes Historical Provider, MD  cholecalciferol (VITAMIN D) 1000 UNITS tablet Take 1,000 Units by mouth daily.     Yes Historical Provider, MD  fish oil-omega-3 fatty acids 1000 MG capsule Take 1 g by mouth daily.    Yes Historical Provider, MD  lisinopril-hydrochlorothiazide (PRINZIDE,ZESTORETIC) 20-12.5 MG per tablet Take 1 tablet by mouth daily.     Yes Historical Provider, MD  sertraline (ZOLOFT) 25 MG tablet Take 25 mg by mouth daily.   Yes Historical Provider, MD  simvastatin (ZOCOR) 20 MG tablet Take 20 mg by mouth at bedtime.     Yes Historical Provider, MD   BP 131/83 mmHg  Pulse 81  Temp(Src) 97.5 F (36.4 C) (Oral)  Resp 12  Ht 5\' 6"  (1.676 m)  Wt 160 lb (72.576 kg)  BMI 25.84 kg/m2  SpO2 98% Physical Exam  Constitutional: He is oriented to person, place, and time. He appears well-developed and well-nourished. No distress.  HENT:  Head: Normocephalic and atraumatic.  Mouth/Throat: No oropharyngeal exudate.  Eyes: Pupils are equal, round, and reactive to light.  Neck: Normal range of motion. Neck supple.  Cardiovascular: Normal rate, regular rhythm and normal heart sounds.  Exam reveals no gallop and no  friction rub.   No murmur heard. Pulmonary/Chest: Effort normal and breath sounds normal. No respiratory distress. He has no wheezes. He has no rales.  Abdominal: Soft. Bowel sounds are normal. He exhibits no distension and no mass. There is no tenderness. There is no rebound and no guarding.  Musculoskeletal: Normal range of motion. He exhibits no edema or tenderness.  Neurological: He is alert and oriented to person, place, and time. He has normal strength. He displays no atrophy and no tremor. No cranial nerve deficit or sensory deficit. He exhibits normal muscle tone. He displays a negative Romberg sign. Coordination normal. GCS eye subscore is 4. GCS verbal subscore is 5. GCS motor subscore is 6.  Skin: Skin is warm and dry.  Psychiatric: He has a normal mood and affect.    ED Course  Procedures (including critical care time) Labs Review Labs Reviewed  CBC WITH DIFFERENTIAL/PLATELET - Abnormal; Notable for the following:    RDW 17.1 (*)    Monocytes Relative 16 (*)    Monocytes Absolute 1.5 (*)    All other components within normal limits  COMPREHENSIVE METABOLIC PANEL - Abnormal; Notable for the following:    Sodium 126 (*)    Chloride 93 (*)    Total Bilirubin 1.4 (*)    GFR calc non Af Amer 85 (*)    All other components within normal limits  URINE CULTURE  URINALYSIS, ROUTINE W REFLEX MICROSCOPIC    Imaging Review Dg Chest 2 View  10/15/2014   CLINICAL DATA:  Altered mental status after starting new medications. Dehydration. History of hypertension. Initial encounter.  EXAM: CHEST  2 VIEW  COMPARISON:  Radiographs 06/04/2012.  FINDINGS: The heart size and mediastinal contours are stable. There are lower lung volumes with slightly increased atelectasis at both lung bases, especially on the lateral view. There is no confluent airspace opacity or pleural effusion. The bones appear intact.  IMPRESSION: Mildly increased bibasilar atelectasis. No confluent airspace opacity or edema.    Electronically Signed   By: Camie Patience M.D.   On: 10/15/2014 18:47     EKG Interpretation None      MDM   Final diagnoses:  Weakness  Polycythemia rubra vera  Hyponatremia  Insomnia    Pt is a 74 y.o. male with Pmhx as above who presents with insomnia since starting sertraline.  4 days ago as well as an episode of shakiness earlier today and generalized weakness in his legs.  On physical exam, initial heart rate 116, though repeated in the 80s.  Vital signs otherwise normal.  He appears in no acute distress.  He has no focal neuro findings.  Cardiopulmonary exam is normal.  He has no tremor, shaking, does not appear to have been seizure-like activity.  He is being followed by Hum[facility] for polycythemia rubra vera, and also by his PCP recently for hyponatremia.  Sodium today is 126 which is improved from 125.  Hemoglobin today is 14.6.  Doren Custard is a insomnia is likely a side effect from the sertraline.  I've asked that he speak with his prescribing provider to discuss alternative treatment options.  We've also asked that he follow-up with his PCP for his hypernatremia as well as his hematologist for his hemoglobin.       Ernestina Patches, MD 10/15/14 2106

## 2014-10-15 NOTE — ED Notes (Signed)
Patient transported to X-ray 

## 2014-10-17 LAB — URINE CULTURE
Colony Count: NO GROWTH
Culture: NO GROWTH

## 2014-10-24 DIAGNOSIS — E871 Hypo-osmolality and hyponatremia: Secondary | ICD-10-CM | POA: Diagnosis not present

## 2014-10-24 DIAGNOSIS — Z79899 Other long term (current) drug therapy: Secondary | ICD-10-CM | POA: Diagnosis not present

## 2014-10-24 DIAGNOSIS — E860 Dehydration: Secondary | ICD-10-CM | POA: Diagnosis not present

## 2014-10-24 DIAGNOSIS — D45 Polycythemia vera: Secondary | ICD-10-CM | POA: Diagnosis not present

## 2014-10-24 DIAGNOSIS — G4700 Insomnia, unspecified: Secondary | ICD-10-CM | POA: Diagnosis not present

## 2014-11-02 DIAGNOSIS — E538 Deficiency of other specified B group vitamins: Secondary | ICD-10-CM | POA: Diagnosis not present

## 2014-11-16 DIAGNOSIS — E538 Deficiency of other specified B group vitamins: Secondary | ICD-10-CM | POA: Diagnosis not present

## 2014-11-22 DIAGNOSIS — G4700 Insomnia, unspecified: Secondary | ICD-10-CM | POA: Diagnosis not present

## 2014-11-22 DIAGNOSIS — F411 Generalized anxiety disorder: Secondary | ICD-10-CM | POA: Diagnosis not present

## 2014-11-22 DIAGNOSIS — I10 Essential (primary) hypertension: Secondary | ICD-10-CM | POA: Diagnosis not present

## 2014-12-26 ENCOUNTER — Other Ambulatory Visit (HOSPITAL_BASED_OUTPATIENT_CLINIC_OR_DEPARTMENT_OTHER): Payer: Medicare Other

## 2014-12-26 ENCOUNTER — Ambulatory Visit (HOSPITAL_BASED_OUTPATIENT_CLINIC_OR_DEPARTMENT_OTHER): Payer: Medicare Other

## 2014-12-26 ENCOUNTER — Ambulatory Visit (HOSPITAL_BASED_OUTPATIENT_CLINIC_OR_DEPARTMENT_OTHER): Payer: Medicare Other | Admitting: Hematology & Oncology

## 2014-12-26 VITALS — BP 127/71 | HR 81 | Temp 97.5°F | Wt 167.0 lb

## 2014-12-26 DIAGNOSIS — D45 Polycythemia vera: Secondary | ICD-10-CM

## 2014-12-26 LAB — CBC WITH DIFFERENTIAL (CANCER CENTER ONLY)
BASO#: 0 10*3/uL (ref 0.0–0.2)
BASO%: 0.3 % (ref 0.0–2.0)
EOS%: 1.4 % (ref 0.0–7.0)
Eosinophils Absolute: 0.1 10*3/uL (ref 0.0–0.5)
HCT: 48.4 % (ref 38.7–49.9)
HGB: 16.2 g/dL (ref 13.0–17.1)
LYMPH#: 2 10*3/uL (ref 0.9–3.3)
LYMPH%: 31.1 % (ref 14.0–48.0)
MCH: 29 pg (ref 28.0–33.4)
MCHC: 33.5 g/dL (ref 32.0–35.9)
MCV: 87 fL (ref 82–98)
MONO#: 1 10*3/uL — ABNORMAL HIGH (ref 0.1–0.9)
MONO%: 15.7 % — ABNORMAL HIGH (ref 0.0–13.0)
NEUT#: 3.3 10*3/uL (ref 1.5–6.5)
NEUT%: 51.5 % (ref 40.0–80.0)
Platelets: 238 10*3/uL (ref 145–400)
RBC: 5.58 10*6/uL (ref 4.20–5.70)
RDW: 16.3 % — ABNORMAL HIGH (ref 11.1–15.7)
WBC: 6.4 10*3/uL (ref 4.0–10.0)

## 2014-12-26 LAB — CMP (CANCER CENTER ONLY)
ALT(SGPT): 26 U/L (ref 10–47)
AST: 23 U/L (ref 11–38)
Albumin: 3.4 g/dL (ref 3.3–5.5)
Alkaline Phosphatase: 51 U/L (ref 26–84)
BUN, Bld: 11 mg/dL (ref 7–22)
CO2: 27 mEq/L (ref 18–33)
Calcium: 9.8 mg/dL (ref 8.0–10.3)
Chloride: 93 mEq/L — ABNORMAL LOW (ref 98–108)
Creat: 1 mg/dl (ref 0.6–1.2)
Glucose, Bld: 98 mg/dL (ref 73–118)
Potassium: 3.6 mEq/L (ref 3.3–4.7)
Sodium: 136 mEq/L (ref 128–145)
Total Bilirubin: 1.2 mg/dl (ref 0.20–1.60)
Total Protein: 6.9 g/dL (ref 6.4–8.1)

## 2014-12-26 NOTE — Progress Notes (Signed)
Hematology and Oncology Follow Up Visit  Jesse [name] [id number] 06-20-1941 74 y.o. 12/26/2014   Principle Diagnosis:  Polycythemia vera- JAK2 negative  Current Therapy:    Phlebotomy to maintain hematocrit below 45%  Aspirin 81 mg by mouth daily     Interim History:  Jesse [name] is back for follow-up. He is doing pretty well. He's had no problems with headache. He's had no cough.  He recently was in the emergency room. He was having some changes in mental status. His workup was pretty much unremarkable.  He has not had any problems with fever. He's had no change in bowel or bladder habits. He's had no leg swelling.  Medications:  Current outpatient prescriptions:  .  ALPRAZolam (XANAX) 0.5 MG tablet, Take 0.25 mg by mouth at bedtime as needed. For sleep, Disp: , Rfl:  .  aspirin EC 81 MG tablet, Take 81 mg by mouth daily., Disp: , Rfl:  .  cholecalciferol (VITAMIN D) 1000 UNITS tablet, Take 1,000 Units by mouth daily.  , Disp: , Rfl:  .  fish oil-omega-3 fatty acids 1000 MG capsule, Take 1 g by mouth daily. , Disp: , Rfl:  .  lisinopril-hydrochlorothiazide (PRINZIDE,ZESTORETIC) 20-12.5 MG per tablet, Take 1 tablet by mouth daily.  , Disp: , Rfl:  .  sertraline (ZOLOFT) 25 MG tablet, Take 25 mg by mouth daily., Disp: , Rfl:  .  simvastatin (ZOCOR) 20 MG tablet, Take 20 mg by mouth at bedtime.  , Disp: , Rfl:   Allergies: No Known Allergies  Past Medical History, Surgical history, Social history, and Family History were reviewed and updated.  Review of Systems: As above  Physical Exam:  vitals were not taken for this visit.  Wt Readings from Last 3 Encounters:  10/15/14 160 lb (72.576 kg)  06/20/14 166 lb (75.297 kg)  05/09/14 169 lb (76.658 kg)     Well-developed and well-nourished white gentleman in no obvious distress. Head and neck exam shows no ocular or oral lesions. There are no palpable cervical or supraclavicular lymph nodes. Lungs are clear. Cardiac exam  regular rate and rhythm with no murmurs, rubs or bruits. Abdomen is soft. He has good bowel sounds. There is no fluid wave. There is no palpable liver or spleen tip. Back exam shows no tenderness over the spine, ribs or hips. Extremities shows no clubbing, cyanosis or edema. Skin exam shows no rashes, ecchymoses or petechia.  Lab Results  Component Value Date   WBC 6.4 12/26/2014   HGB 16.2 12/26/2014   HCT 48.4 12/26/2014   MCV 87 12/26/2014   PLT 238 12/26/2014     Chemistry      Component Value Date/Time   NA 126* 10/15/2014 1822   NA 125* 10/03/2014 1350   K 3.7 10/15/2014 1822   K 3.4 10/03/2014 1350   CL 93* 10/15/2014 1822   CL 88* 10/03/2014 1350   CO2 25 10/15/2014 1822   CO2 28 10/03/2014 1350   BUN 9 10/15/2014 1822   BUN 7 10/03/2014 1350   CREATININE 0.85 10/15/2014 1822   CREATININE 1.0 10/03/2014 1350      Component Value Date/Time   CALCIUM 9.8 10/15/2014 1822   CALCIUM 10.3 10/03/2014 1350   ALKPHOS 57 10/15/2014 1822   ALKPHOS 53 10/03/2014 1350   AST 30 10/15/2014 1822   AST 26 10/03/2014 1350   ALT 30 10/15/2014 1822   ALT 30 10/03/2014 1350   BILITOT 1.4* 10/15/2014 1822   BILITOT 1.80* 10/03/2014  1350         Impression and Plan: Jesse [name] is 74 year old gentleman with polycythemia vera. We will go ahead and phlebotomize him. His been several months since he has been phlebotomized.  I will plan to see him back in another 6 weeks.   Volanda Napoleon, MD 4/4/20169:22 AM

## 2014-12-26 NOTE — Patient Instructions (Signed)

## 2014-12-26 NOTE — Progress Notes (Signed)
Jesse [name] presents today for phlebotomy per MD orders. Phlebotomy procedure started at 0930 and ended at 0940. 500 grams removed. Patient observed for 30 minutes after procedure without any incident. Patient tolerated procedure well. IV needle removed intact.

## 2014-12-27 LAB — IRON AND TIBC CHCC
%SAT: 15 % — ABNORMAL LOW (ref 20–55)
Iron: 56 ug/dL (ref 42–163)
TIBC: 384 ug/dL (ref 202–409)
UIBC: 328 ug/dL (ref 117–376)

## 2014-12-27 LAB — FERRITIN CHCC: Ferritin: 21 ng/ml — ABNORMAL LOW (ref 22–316)

## 2015-01-27 DIAGNOSIS — Z1389 Encounter for screening for other disorder: Secondary | ICD-10-CM | POA: Diagnosis not present

## 2015-01-27 DIAGNOSIS — I10 Essential (primary) hypertension: Secondary | ICD-10-CM | POA: Diagnosis not present

## 2015-01-27 DIAGNOSIS — E559 Vitamin D deficiency, unspecified: Secondary | ICD-10-CM | POA: Diagnosis not present

## 2015-01-27 DIAGNOSIS — Z0000 Encounter for general adult medical examination without abnormal findings: Secondary | ICD-10-CM | POA: Diagnosis not present

## 2015-01-27 DIAGNOSIS — E538 Deficiency of other specified B group vitamins: Secondary | ICD-10-CM | POA: Diagnosis not present

## 2015-02-27 DIAGNOSIS — E538 Deficiency of other specified B group vitamins: Secondary | ICD-10-CM | POA: Diagnosis not present

## 2015-03-15 ENCOUNTER — Ambulatory Visit (INDEPENDENT_AMBULATORY_CARE_PROVIDER_SITE_OTHER): Payer: Medicare Other | Admitting: Ophthalmology

## 2015-03-15 DIAGNOSIS — H43813 Vitreous degeneration, bilateral: Secondary | ICD-10-CM | POA: Diagnosis not present

## 2015-03-15 DIAGNOSIS — H35033 Hypertensive retinopathy, bilateral: Secondary | ICD-10-CM

## 2015-03-15 DIAGNOSIS — I10 Essential (primary) hypertension: Secondary | ICD-10-CM | POA: Diagnosis not present

## 2015-03-15 DIAGNOSIS — H338 Other retinal detachments: Secondary | ICD-10-CM

## 2015-03-15 DIAGNOSIS — H33301 Unspecified retinal break, right eye: Secondary | ICD-10-CM | POA: Diagnosis not present

## 2015-03-20 ENCOUNTER — Ambulatory Visit (HOSPITAL_BASED_OUTPATIENT_CLINIC_OR_DEPARTMENT_OTHER): Payer: Medicare Other

## 2015-03-20 ENCOUNTER — Ambulatory Visit (HOSPITAL_BASED_OUTPATIENT_CLINIC_OR_DEPARTMENT_OTHER): Payer: Medicare Other | Admitting: Family

## 2015-03-20 ENCOUNTER — Other Ambulatory Visit (HOSPITAL_BASED_OUTPATIENT_CLINIC_OR_DEPARTMENT_OTHER): Payer: Medicare Other

## 2015-03-20 VITALS — BP 135/74 | HR 78 | Temp 97.3°F | Resp 20 | Wt 169.0 lb

## 2015-03-20 VITALS — BP 125/71 | HR 62

## 2015-03-20 DIAGNOSIS — D45 Polycythemia vera: Secondary | ICD-10-CM

## 2015-03-20 LAB — CBC WITH DIFFERENTIAL (CANCER CENTER ONLY)
BASO#: 0 10*3/uL (ref 0.0–0.2)
BASO%: 0.4 % (ref 0.0–2.0)
EOS%: 1 % (ref 0.0–7.0)
Eosinophils Absolute: 0.1 10*3/uL (ref 0.0–0.5)
HCT: 49.4 % (ref 38.7–49.9)
HGB: 16.8 g/dL (ref 13.0–17.1)
LYMPH#: 2.3 10*3/uL (ref 0.9–3.3)
LYMPH%: 31.1 % (ref 14.0–48.0)
MCH: 30.1 pg (ref 28.0–33.4)
MCHC: 34 g/dL (ref 32.0–35.9)
MCV: 89 fL (ref 82–98)
MONO#: 1.2 10*3/uL — ABNORMAL HIGH (ref 0.1–0.9)
MONO%: 16.8 % — ABNORMAL HIGH (ref 0.0–13.0)
NEUT#: 3.7 10*3/uL (ref 1.5–6.5)
NEUT%: 50.7 % (ref 40.0–80.0)
Platelets: 228 10*3/uL (ref 145–400)
RBC: 5.58 10*6/uL (ref 4.20–5.70)
RDW: 14.6 % (ref 11.1–15.7)
WBC: 7.3 10*3/uL (ref 4.0–10.0)

## 2015-03-20 LAB — FERRITIN CHCC: Ferritin: 23 ng/ml (ref 22–316)

## 2015-03-20 LAB — COMPREHENSIVE METABOLIC PANEL
ALT: 27 U/L (ref 0–53)
AST: 20 U/L (ref 0–37)
Albumin: 4 g/dL (ref 3.5–5.2)
Alkaline Phosphatase: 53 U/L (ref 39–117)
BUN: 13 mg/dL (ref 6–23)
CO2: 26 mEq/L (ref 19–32)
Calcium: 10.2 mg/dL (ref 8.4–10.5)
Chloride: 95 mEq/L — ABNORMAL LOW (ref 96–112)
Creatinine, Ser: 0.85 mg/dL (ref 0.50–1.35)
Glucose, Bld: 93 mg/dL (ref 70–99)
Potassium: 3.7 mEq/L (ref 3.5–5.3)
Sodium: 133 mEq/L — ABNORMAL LOW (ref 135–145)
Total Bilirubin: 1.2 mg/dL (ref 0.2–1.2)
Total Protein: 6.9 g/dL (ref 6.0–8.3)

## 2015-03-20 LAB — IRON AND TIBC CHCC
%SAT: 24 % (ref 20–55)
Iron: 82 ug/dL (ref 42–163)
TIBC: 346 ug/dL (ref 202–409)
UIBC: 264 ug/dL (ref 117–376)

## 2015-03-20 NOTE — Progress Notes (Signed)
Hematology and Oncology Follow Up Visit  Jesse [name] [id number] March 23, 1941 74 y.o. 03/20/2015   Principle Diagnosis:  Polycythemia vera- JAK2 negative  Current Therapy:   Phlebotomy to maintain hematocrit below 45% Aspirin 81 mg by mouth daily    Interim History:  Jesse [name] is here today for a follow-up. He is doing well and has no complaints at this time. He is walking every day and plans to start going back to the gym. He also enjoys golfing in the morning before it gets too hot.  He has had no fever, n/v, cough, rash, dizziness, headaches, blurred vision, SOB, chest pain, palpitations, abdominal pain, constipation, diarrhea, blood in urine or stool.  No bruising or episodes of bleeding.  No swelling, tenderness, numbness or tingling in his extremities. No new aches or pains.  He is eating well and staying hydrated. His weight is stable.   Medications:    Medication List       This list is accurate as of: 03/20/15  8:45 AM.  Always use your most recent med list.               ALPRAZolam 0.5 MG tablet  Commonly known as:  XANAX  Take 0.25 mg by mouth at bedtime as needed. For sleep     aspirin EC 81 MG tablet  Take 81 mg by mouth daily.     cholecalciferol 1000 UNITS tablet  Commonly known as:  VITAMIN D  Take 1,000 Units by mouth daily.     fish oil-omega-3 fatty acids 1000 MG capsule  Take 1 g by mouth daily.     lisinopril-hydrochlorothiazide 20-12.5 MG per tablet  Commonly known as:  PRINZIDE,ZESTORETIC  Take 1 tablet by mouth daily.     sertraline 25 MG tablet  Commonly known as:  ZOLOFT  Take 25 mg by mouth daily.     simvastatin 20 MG tablet  Commonly known as:  ZOCOR  Take 20 mg by mouth at bedtime.        Allergies: No Known Allergies  Past Medical History, Surgical history, Social history, and Family History were reviewed and updated.  Review of Systems: All other 10 point review of systems is negative.   Physical Exam:  vitals were not  taken for this visit.  Wt Readings from Last 3 Encounters:  12/26/14 167 lb (75.751 kg)  10/15/14 160 lb (72.576 kg)  06/20/14 166 lb (75.297 kg)    Ocular: Sclerae unicteric, pupils equal, round and reactive to light Ear-nose-throat: Oropharynx clear, dentition fair Lymphatic: No cervical or supraclavicular adenopathy Lungs no rales or rhonchi, good excursion bilaterally Heart regular rate and rhythm, no murmur appreciated Abd soft, nontender, positive bowel sounds MSK no focal spinal tenderness, no joint edema Neuro: non-focal, well-oriented, appropriate affect Breasts: Deferred  Lab Results  Component Value Date   WBC 7.3 03/20/2015   HGB 16.8 03/20/2015   HCT 49.4 03/20/2015   MCV 89 03/20/2015   PLT 228 03/20/2015   Lab Results  Component Value Date   FERRITIN 21* 12/26/2014   IRON 56 12/26/2014   TIBC 384 12/26/2014   UIBC 328 12/26/2014   IRONPCTSAT 15* 12/26/2014   Lab Results  Component Value Date   RETICCTPCT 1.6 01/02/2011   RBC 5.58 03/20/2015   RETICCTABS 87.5 01/02/2011   No results found for: KPAFRELGTCHN, LAMBDASER, KAPLAMBRATIO No results found for: IGGSERUM, IGA, IGMSERUM No results found for: TOTALPROTELP, ALBUMINELP, A1GS, A2GS, BETS, BETA2SER, [place], [place], SPEI   Chemistry  Component Value Date/Time   NA 136 12/26/2014 0851   NA 126* 10/15/2014 1822   K 3.6 12/26/2014 0851   K 3.7 10/15/2014 1822   CL 93* 12/26/2014 0851   CL 93* 10/15/2014 1822   CO2 27 12/26/2014 0851   CO2 25 10/15/2014 1822   BUN 11 12/26/2014 0851   BUN 9 10/15/2014 1822   CREATININE 1.0 12/26/2014 0851   CREATININE 0.85 10/15/2014 1822      Component Value Date/Time   CALCIUM 9.8 12/26/2014 0851   CALCIUM 9.8 10/15/2014 1822   ALKPHOS 51 12/26/2014 0851   ALKPHOS 57 10/15/2014 1822   AST 23 12/26/2014 0851   AST 30 10/15/2014 1822   ALT 26 12/26/2014 0851   ALT 30 10/15/2014 1822   BILITOT 1.20 12/26/2014 0851   BILITOT 1.4* 10/15/2014 1822      Impression and Plan: Jesse [name] is 74 year old gentleman with polycythemia vera. He is doing well and is asymptomatic at this time despite his Hct being 49.4.  We will phlebotomize him today.  We will see him back in 2 months for labs and follow-up.  He knows to call here with any questions or concerns. We can certainly see him sooner if need be.   Eliezer Bottom, NP 6/27/20168:45 AM

## 2015-03-20 NOTE — Progress Notes (Signed)
Jesse [name] presents today for phlebotomy per MD orders. Phlebotomy procedure started at 0910 and ended at 0920. 500 ml  removed. Patient observed for 30 minutes after procedure without any incident. Patient tolerated procedure well. IV needle removed intact.

## 2015-03-20 NOTE — Patient Instructions (Signed)

## 2015-04-03 DIAGNOSIS — E538 Deficiency of other specified B group vitamins: Secondary | ICD-10-CM | POA: Diagnosis not present

## 2015-05-01 ENCOUNTER — Telehealth: Payer: Self-pay | Admitting: Hematology & Oncology

## 2015-05-01 NOTE — Telephone Encounter (Signed)
Contacted pt regarding a change in 8/26 to 9/1. Pt's wife confirmed appt

## 2015-05-19 ENCOUNTER — Other Ambulatory Visit: Payer: Medicare Other

## 2015-05-19 ENCOUNTER — Ambulatory Visit: Payer: Medicare Other | Admitting: Hematology & Oncology

## 2015-05-25 ENCOUNTER — Ambulatory Visit (HOSPITAL_BASED_OUTPATIENT_CLINIC_OR_DEPARTMENT_OTHER): Payer: Medicare Other

## 2015-05-25 ENCOUNTER — Other Ambulatory Visit (HOSPITAL_BASED_OUTPATIENT_CLINIC_OR_DEPARTMENT_OTHER): Payer: Medicare Other

## 2015-05-25 ENCOUNTER — Ambulatory Visit (HOSPITAL_BASED_OUTPATIENT_CLINIC_OR_DEPARTMENT_OTHER): Payer: Medicare Other | Admitting: Family

## 2015-05-25 ENCOUNTER — Encounter: Payer: Self-pay | Admitting: Family

## 2015-05-25 VITALS — BP 115/72 | HR 72

## 2015-05-25 DIAGNOSIS — Z23 Encounter for immunization: Secondary | ICD-10-CM | POA: Diagnosis not present

## 2015-05-25 DIAGNOSIS — D45 Polycythemia vera: Secondary | ICD-10-CM | POA: Diagnosis not present

## 2015-05-25 LAB — COMPREHENSIVE METABOLIC PANEL
ALT: 27 U/L (ref 9–46)
AST: 19 U/L (ref 10–35)
Albumin: 4 g/dL (ref 3.6–5.1)
Alkaline Phosphatase: 51 U/L (ref 40–115)
BUN: 10 mg/dL (ref 7–25)
CO2: 24 mmol/L (ref 20–31)
Calcium: 9.8 mg/dL (ref 8.6–10.3)
Chloride: 95 mmol/L — ABNORMAL LOW (ref 98–110)
Creatinine, Ser: 0.75 mg/dL (ref 0.70–1.18)
Glucose, Bld: 103 mg/dL — ABNORMAL HIGH (ref 65–99)
Potassium: 3.7 mmol/L (ref 3.5–5.3)
Sodium: 131 mmol/L — ABNORMAL LOW (ref 135–146)
Total Bilirubin: 1.6 mg/dL — ABNORMAL HIGH (ref 0.2–1.2)
Total Protein: 6.6 g/dL (ref 6.1–8.1)

## 2015-05-25 LAB — CBC WITH DIFFERENTIAL (CANCER CENTER ONLY)
BASO#: 0 10*3/uL (ref 0.0–0.2)
BASO%: 0.4 % (ref 0.0–2.0)
EOS%: 1 % (ref 0.0–7.0)
Eosinophils Absolute: 0.1 10*3/uL (ref 0.0–0.5)
HCT: 47.6 % (ref 38.7–49.9)
HGB: 16 g/dL (ref 13.0–17.1)
LYMPH#: 2.3 10*3/uL (ref 0.9–3.3)
LYMPH%: 28.9 % (ref 14.0–48.0)
MCH: 29.5 pg (ref 28.0–33.4)
MCHC: 33.6 g/dL (ref 32.0–35.9)
MCV: 88 fL (ref 82–98)
MONO#: 1.4 10*3/uL — ABNORMAL HIGH (ref 0.1–0.9)
MONO%: 17.7 % — ABNORMAL HIGH (ref 0.0–13.0)
NEUT#: 4.2 10*3/uL (ref 1.5–6.5)
NEUT%: 52 % (ref 40.0–80.0)
Platelets: 243 10*3/uL (ref 145–400)
RBC: 5.42 10*6/uL (ref 4.20–5.70)
RDW: 14.7 % (ref 11.1–15.7)
WBC: 8.1 10*3/uL (ref 4.0–10.0)

## 2015-05-25 LAB — IRON AND TIBC CHCC
%SAT: 34 % (ref 20–55)
Iron: 122 ug/dL (ref 42–163)
TIBC: 356 ug/dL (ref 202–409)
UIBC: 234 ug/dL (ref 117–376)

## 2015-05-25 LAB — FERRITIN CHCC: Ferritin: 25 ng/ml (ref 22–316)

## 2015-05-25 MED ORDER — INFLUENZA VAC SPLIT QUAD 0.5 ML IM SUSY
0.5000 mL | PREFILLED_SYRINGE | Freq: Once | INTRAMUSCULAR | Status: AC
  Administered 2015-05-25: 0.5 mL via INTRAMUSCULAR
  Filled 2015-05-25: qty 0.5

## 2015-05-25 NOTE — Progress Notes (Signed)
Hematology and Oncology Follow Up Visit  Jesse [name] [id number] 01/05/1941 74 y.o. 05/25/2015   Principle Diagnosis:  Polycythemia vera- JAK2 negative  Current Therapy:   Phlebotomy to maintain hematocrit below 45% Aspirin 81 mg by mouth daily    Interim History:  Jesse [name] is here today for a follow-up. He is doing well and is asymptomatic at this time.  He denies fatigue. No fever, n/v, cough, rash, dizziness, headaches, blurred vision, SOB, chest pain, palpitations, abdominal pain, changes in bowel or bladder habits. He has not noticed andy blood in his urine or stool.  No bruising or episodes of bleeding.  He has had no swelling, tenderness, numbness or tingling in his extremities. No joint aches or pains.  He is eating healthy and making sure to stay well hydrated. His weight is stable. He continue to be active enjoying walking and golf.   Medications:    Medication List       This list is accurate as of: 05/25/15  9:22 AM.  Always use your most recent med list.               ALPRAZolam 0.5 MG tablet  Commonly known as:  XANAX  Take 0.25 mg by mouth at bedtime as needed. For sleep     aspirin EC 81 MG tablet  Take 81 mg by mouth daily.     cholecalciferol 1000 UNITS tablet  Commonly known as:  VITAMIN D  Take 1,000 Units by mouth daily.     fish oil-omega-3 fatty acids 1000 MG capsule  Take 1 g by mouth daily.     lisinopril-hydrochlorothiazide 20-12.5 MG per tablet  Commonly known as:  PRINZIDE,ZESTORETIC  Take 1 tablet by mouth daily.     sertraline 25 MG tablet  Commonly known as:  ZOLOFT  Take 25 mg by mouth daily.     simvastatin 20 MG tablet  Commonly known as:  ZOCOR  Take 20 mg by mouth at bedtime.        Allergies: No Known Allergies  Past Medical History, Surgical history, Social history, and Family History were reviewed and updated.  Review of Systems: All other 10 point review of systems is negative.   Physical Exam:  vitals were  not taken for this visit.  Wt Readings from Last 3 Encounters:  03/20/15 169 lb (76.658 kg)  12/26/14 167 lb (75.751 kg)  10/15/14 160 lb (72.576 kg)    Ocular: Sclerae unicteric, pupils equal, round and reactive to light Ear-nose-throat: Oropharynx clear, dentition fair Lymphatic: No cervical or supraclavicular adenopathy Lungs no rales or rhonchi, good excursion bilaterally Heart regular rate and rhythm, no murmur appreciated Abd soft, nontender, positive bowel sounds MSK no focal spinal tenderness, no joint edema Neuro: non-focal, well-oriented, appropriate affect Breasts: Deferred  Lab Results  Component Value Date   WBC 8.1 05/25/2015   HGB 16.0 05/25/2015   HCT 47.6 05/25/2015   MCV 88 05/25/2015   PLT 243 05/25/2015   Lab Results  Component Value Date   FERRITIN 23 03/20/2015   IRON 82 03/20/2015   TIBC 346 03/20/2015   UIBC 264 03/20/2015   IRONPCTSAT 24 03/20/2015   Lab Results  Component Value Date   RETICCTPCT 1.6 01/02/2011   RBC 5.42 05/25/2015   RETICCTABS 87.5 01/02/2011   No results found for: KPAFRELGTCHN, LAMBDASER, KAPLAMBRATIO No results found for: IGGSERUM, IGA, IGMSERUM No results found for: TOTALPROTELP, ALBUMINELP, A1GS, A2GS, BETS, BETA2SER, [place], [place], SPEI   Chemistry  Component Value Date/Time   NA 133* 03/20/2015 0832   NA 136 12/26/2014 0851   K 3.7 03/20/2015 0832   K 3.6 12/26/2014 0851   CL 95* 03/20/2015 0832   CL 93* 12/26/2014 0851   CO2 26 03/20/2015 0832   CO2 27 12/26/2014 0851   BUN 13 03/20/2015 0832   BUN 11 12/26/2014 0851   CREATININE 0.85 03/20/2015 0832   CREATININE 1.0 12/26/2014 0851      Component Value Date/Time   CALCIUM 10.2 03/20/2015 0832   CALCIUM 9.8 12/26/2014 0851   ALKPHOS 53 03/20/2015 0832   ALKPHOS 51 12/26/2014 0851   AST 20 03/20/2015 0832   AST 23 12/26/2014 0851   ALT 27 03/20/2015 0832   ALT 26 12/26/2014 0851   BILITOT 1.2 03/20/2015 0832   BILITOT 1.20 12/26/2014 0851      Impression and Plan: Jesse [name] is 74 year old gentleman with polycythemia vera. He is doing well and is asymptomatic at this time. His Hct today is 47.6 so we will phlebotomize him today.  He will continue to take his baby aspirin daily.  We will plan to see him back in 2 months for labs and follow-up.  He knows to contact us with any questions or concerns. We can certainly see him sooner if need be.   Eliezer Bottom, NP 9/1/20169:22 AM

## 2015-05-25 NOTE — Patient Instructions (Signed)

## 2015-05-25 NOTE — Progress Notes (Signed)
Jesse [name] presents today for phlebotomy per MD orders. Phlebotomy procedure started at 0930 and ended at 0945. 500 grams removed. Patient observed for 30 minutes after procedure without any incident. Patient tolerated procedure well. IV needle removed intact.

## 2015-07-25 ENCOUNTER — Encounter: Payer: Self-pay | Admitting: Hematology & Oncology

## 2015-07-25 ENCOUNTER — Ambulatory Visit (HOSPITAL_BASED_OUTPATIENT_CLINIC_OR_DEPARTMENT_OTHER): Payer: Medicare Other | Admitting: Hematology & Oncology

## 2015-07-25 ENCOUNTER — Ambulatory Visit (HOSPITAL_BASED_OUTPATIENT_CLINIC_OR_DEPARTMENT_OTHER): Payer: Medicare Other

## 2015-07-25 ENCOUNTER — Other Ambulatory Visit (HOSPITAL_BASED_OUTPATIENT_CLINIC_OR_DEPARTMENT_OTHER): Payer: Medicare Other

## 2015-07-25 VITALS — BP 145/83 | HR 84 | Temp 97.3°F | Resp 16 | Ht 66.0 in | Wt 168.0 lb

## 2015-07-25 VITALS — BP 120/81 | HR 74 | Resp 20

## 2015-07-25 DIAGNOSIS — D45 Polycythemia vera: Secondary | ICD-10-CM | POA: Diagnosis not present

## 2015-07-25 LAB — CBC WITH DIFFERENTIAL (CANCER CENTER ONLY)
BASO#: 0 10*3/uL (ref 0.0–0.2)
BASO%: 0.3 % (ref 0.0–2.0)
EOS%: 1.5 % (ref 0.0–7.0)
Eosinophils Absolute: 0.1 10*3/uL (ref 0.0–0.5)
HCT: 49.4 % (ref 38.7–49.9)
HGB: 16.6 g/dL (ref 13.0–17.1)
LYMPH#: 2.3 10*3/uL (ref 0.9–3.3)
LYMPH%: 31.5 % (ref 14.0–48.0)
MCH: 29.6 pg (ref 28.0–33.4)
MCHC: 33.6 g/dL (ref 32.0–35.9)
MCV: 88 fL (ref 82–98)
MONO#: 1 10*3/uL — ABNORMAL HIGH (ref 0.1–0.9)
MONO%: 14 % — ABNORMAL HIGH (ref 0.0–13.0)
NEUT#: 3.8 10*3/uL (ref 1.5–6.5)
NEUT%: 52.7 % (ref 40.0–80.0)
Platelets: 237 10*3/uL (ref 145–400)
RBC: 5.61 10*6/uL (ref 4.20–5.70)
RDW: 15.2 % (ref 11.1–15.7)
WBC: 7.2 10*3/uL (ref 4.0–10.0)

## 2015-07-25 LAB — COMPREHENSIVE METABOLIC PANEL (CC13)
ALT: 31 U/L (ref 0–55)
AST: 23 U/L (ref 5–34)
Albumin: 3.7 g/dL (ref 3.5–5.0)
Alkaline Phosphatase: 59 U/L (ref 40–150)
Anion Gap: 8 mEq/L (ref 3–11)
BUN: 10.7 mg/dL (ref 7.0–26.0)
CO2: 27 mEq/L (ref 22–29)
Calcium: 10.2 mg/dL (ref 8.4–10.4)
Chloride: 100 mEq/L (ref 98–109)
Creatinine: 0.8 mg/dL (ref 0.7–1.3)
EGFR: 90 mL/min/{1.73_m2} — ABNORMAL LOW (ref >90)
Glucose: 87 mg/dl (ref 70–140)
Potassium: 3.6 mEq/L (ref 3.5–5.1)
Sodium: 135 mEq/L — ABNORMAL LOW (ref 136–145)
Total Bilirubin: 1.22 mg/dL — ABNORMAL HIGH (ref 0.20–1.20)
Total Protein: 6.9 g/dL (ref 6.4–8.3)

## 2015-07-25 LAB — IRON AND TIBC CHCC
%SAT: 19 % — ABNORMAL LOW (ref 20–55)
Iron: 70 ug/dL (ref 42–163)
TIBC: 363 ug/dL (ref 202–409)
UIBC: 293 ug/dL (ref 117–376)

## 2015-07-25 LAB — FERRITIN CHCC: Ferritin: 21 ng/ml — ABNORMAL LOW (ref 22–316)

## 2015-07-25 NOTE — Progress Notes (Signed)
Hematology and Oncology Follow Up Visit  Jesse [name] [id number] 04/27/1941 74 y.o. 07/25/2015   Principle Diagnosis:  Polycythemia vera- JAK2 negative  Current Therapy:    Phlebotomy to maintain hematocrit below 45%  Aspirin 81 mg by mouth daily     Interim History:  [name] is back for follow-up. He is doing pretty well. He is feeling a little more tired. He feels that he will need a phlebotomy. He is pretty accurate as to when he does get phlebotomized.  He's had no problems with pruritus. He's had no change in bowel or bladder habits. He's had no cough.  He's had no temperatures. He's had no fever.  His appetite has been good.  He's had no leg swelling. He's had no rashes. He's had no ecchymoses.       Medications:  Current outpatient prescriptions:  .  ALPRAZolam (XANAX) 0.5 MG tablet, Take 0.25 mg by mouth at bedtime as needed. For sleep, Disp: , Rfl:  .  aspirin EC 81 MG tablet, Take 81 mg by mouth daily., Disp: , Rfl:  .  cholecalciferol (VITAMIN D) 1000 UNITS tablet, Take 1,000 Units by mouth daily.  , Disp: , Rfl:  .  fish oil-omega-3 fatty acids 1000 MG capsule, Take 1 g by mouth daily. , Disp: , Rfl:  .  lisinopril-hydrochlorothiazide (PRINZIDE,ZESTORETIC) 20-12.5 MG per tablet, Take 1 tablet by mouth daily.  , Disp: , Rfl:  .  sertraline (ZOLOFT) 25 MG tablet, Take 25 mg by mouth daily., Disp: , Rfl:  .  simvastatin (ZOCOR) 20 MG tablet, Take 20 mg by mouth at bedtime.  , Disp: , Rfl:   Allergies: No Known Allergies  Past Medical History, Surgical history, Social history, and Family History were reviewed and updated.  Review of Systems: As above  Physical Exam:  height is 5\' 6"  (1.676 m) and weight is 168 lb (76.204 kg). His oral temperature is 97.3 F (36.3 C). His blood pressure is 145/83 and his pulse is 84. His respiration is 16.   Wt Readings from Last 3 Encounters:  07/25/15 168 lb (76.204 kg)  03/20/15 169 lb (76.658 kg)  12/26/14 167 lb  (75.751 kg)     Well-developed and well-nourished white gentleman in no obvious distress. Head and neck exam shows no ocular or oral lesions. There are no palpable cervical or supraclavicular lymph nodes. Lungs are clear. Cardiac exam regular rate and rhythm with no murmurs, rubs or bruits. Abdomen is soft. He has good bowel sounds. There is no fluid wave. There is no palpable liver or spleen tip. Back exam shows no tenderness over the spine, ribs or hips. Extremities shows no clubbing, cyanosis or edema. Skin exam shows no rashes, ecchymoses or petechia.  Lab Results  Component Value Date   WBC 7.2 07/25/2015   HGB 16.6 07/25/2015   HCT 49.4 07/25/2015   MCV 88 07/25/2015   PLT 237 07/25/2015     Chemistry      Component Value Date/Time   NA 131* 05/25/2015 0840   NA 136 12/26/2014 0851   K 3.7 05/25/2015 0840   K 3.6 12/26/2014 0851   CL 95* 05/25/2015 0840   CL 93* 12/26/2014 0851   CO2 24 05/25/2015 0840   CO2 27 12/26/2014 0851   BUN 10 05/25/2015 0840   BUN 11 12/26/2014 0851   CREATININE 0.75 05/25/2015 0840   CREATININE 1.0 12/26/2014 0851      Component Value Date/Time   CALCIUM 9.8 05/25/2015  0840   CALCIUM 9.8 12/26/2014 0851   ALKPHOS 51 05/25/2015 0840   ALKPHOS 51 12/26/2014 0851   AST 19 05/25/2015 0840   AST 23 12/26/2014 0851   ALT 27 05/25/2015 0840   ALT 26 12/26/2014 0851   BILITOT 1.6* 05/25/2015 0840   BILITOT 1.20 12/26/2014 0851         Impression and Plan: [name] is 74 year old gentleman with polycythemia vera. We will go ahead and phlebotomize him. I think that in order to get him through the holidays, we will phlebotomize him next week.  He feels much better when he does get phlebotomized.  I will plan to see him back in another 2 months.   Volanda Napoleon, MD 11/1/20169:02 AM

## 2015-07-25 NOTE — Patient Instructions (Signed)
Therapeutic Phlebotomy, Care After  Refer to this sheet in the next few weeks. These instructions provide you with information about caring for yourself after your procedure. Your health care provider may also give you more specific instructions. Your treatment has been planned according to current medical practices, but problems sometimes occur. Call your health care provider if you have any problems or questions after your procedure.  WHAT TO EXPECT AFTER THE PROCEDURE  After your procedure, it is common to have:   Light-headedness or dizziness. You may feel faint.   Nausea.   Tiredness.  HOME CARE INSTRUCTIONS  Activities   Return to your normal activities as directed by your health care provider. Most people can go back to their normal activities right away.   Avoid strenuous physical activity and heavy lifting or pulling for about 5 hours after the procedure. Do not lift anything that is heavier than 10 lb (4.5 kg).   Athletes should avoid strenuous exercise for at least 12 hours.   Change positions slowly for the remainder of the day. This will help to prevent light-headedness or fainting.   If you feel light-headed, lie down until the feeling goes away.  Eating and Drinking   Be sure to eat well-balanced meals for the next 24 hours.   Drink enough fluid to keep your urine clear or pale yellow.   Avoid drinking alcohol on the day that you had the procedure.  Care of the Needle Insertion Site   Keep your bandage dry. You can remove the bandage after about 5 hours or as directed by your health care provider.   If you have bleeding from the needle insertion site, elevate your arm and press firmly on the site until the bleeding stops.   If you have bruising at the site, apply ice to the area:   Put ice in a plastic bag.   Place a towel between your skin and the bag.   Leave the ice on for 20 minutes, 2-3 times a day for the first 24 hours.   If the swelling does not go away after 24 hours, apply  a warm, moist washcloth to the area for 20 minutes, 2-3 times a day.  General Instructions   Avoid smoking for at least 30 minutes after the procedure.   Keep all follow-up visits as directed by your health care provider. It is important to continue with further therapeutic phlebotomy treatments as directed.  SEEK MEDICAL CARE IF:   You have redness, swelling, or pain at the needle insertion site.   You have fluid, blood, or pus coming from the needle insertion site.   You feel light-headed, dizzy, or nauseated, and the feeling does not go away.   You notice new bruising at the needle insertion site.   You feel weaker than normal.   You have a fever or chills.  SEEK IMMEDIATE MEDICAL CARE IF:   You have severe nausea or vomiting.   You have chest pain.   You have trouble breathing.    This information is not intended to replace advice given to you by your health care provider. Make sure you discuss any questions you have with your health care provider.    Document Released: 02/11/2011 Document Revised: 01/24/2015 Document Reviewed: 09/05/2014  [employer]vier Interactive Patient Education 2016 [employer]

## 2015-07-25 NOTE — Progress Notes (Signed)
Angie Fava presents today for phlebotomy per MD orders. Phlebotomy procedure started at 0858 and ended at 0912. Approximately 500 mls removed. Patient observed for 30 minutes after procedure without any incident. Patient tolerated procedure well. IV needle removed intact.

## 2015-08-01 DIAGNOSIS — D519 Vitamin B12 deficiency anemia, unspecified: Secondary | ICD-10-CM | POA: Diagnosis not present

## 2015-08-01 DIAGNOSIS — I10 Essential (primary) hypertension: Secondary | ICD-10-CM | POA: Diagnosis not present

## 2015-08-01 DIAGNOSIS — E538 Deficiency of other specified B group vitamins: Secondary | ICD-10-CM | POA: Diagnosis not present

## 2015-08-01 DIAGNOSIS — F329 Major depressive disorder, single episode, unspecified: Secondary | ICD-10-CM | POA: Diagnosis not present

## 2015-08-02 ENCOUNTER — Ambulatory Visit (HOSPITAL_BASED_OUTPATIENT_CLINIC_OR_DEPARTMENT_OTHER): Payer: Medicare Other

## 2015-08-02 VITALS — BP 120/62 | HR 82 | Temp 97.4°F

## 2015-08-02 DIAGNOSIS — D45 Polycythemia vera: Secondary | ICD-10-CM | POA: Diagnosis not present

## 2015-08-02 NOTE — Progress Notes (Signed)
Jesse [name] presents today for phlebotomy per MD orders. Phlebotomy procedure started at 1400 and ended at 1410. 500 grams removed. Patient observed for 30 minutes after procedure without any incident. Patient tolerated procedure well. IV needle removed intact.

## 2015-08-02 NOTE — Patient Instructions (Signed)

## 2015-08-31 DIAGNOSIS — I10 Essential (primary) hypertension: Secondary | ICD-10-CM | POA: Diagnosis not present

## 2015-08-31 DIAGNOSIS — F411 Generalized anxiety disorder: Secondary | ICD-10-CM | POA: Diagnosis not present

## 2015-09-07 DIAGNOSIS — H6123 Impacted cerumen, bilateral: Secondary | ICD-10-CM | POA: Diagnosis not present

## 2015-09-14 IMAGING — DX DG CHEST 2V
2 series · 2 of 2 positions shown · non-contrast
Comparison: Radiographs 06/04/2012.

CLINICAL DATA: Altered mental status after starting new
medications. Dehydration. History of hypertension. Initial
encounter.

EXAM:
CHEST  2 VIEW

[chest pa]
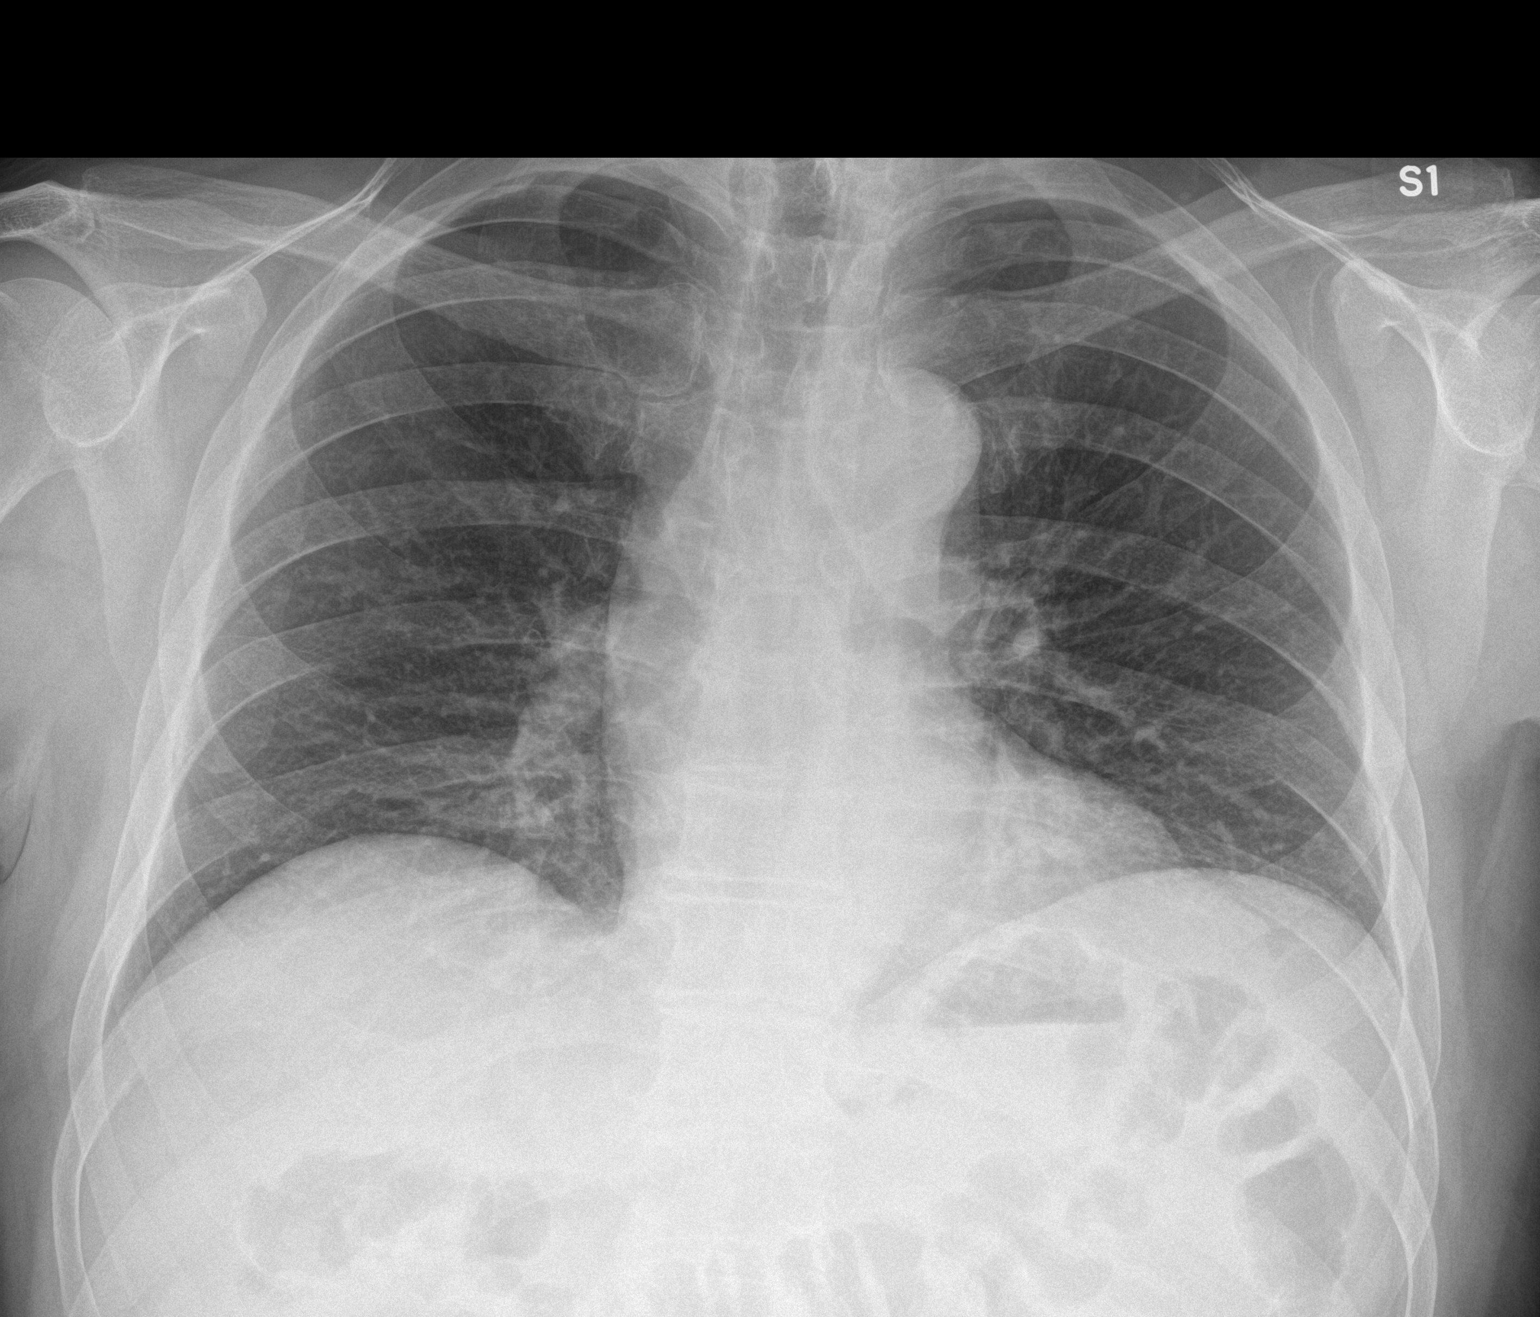

[chest lat]
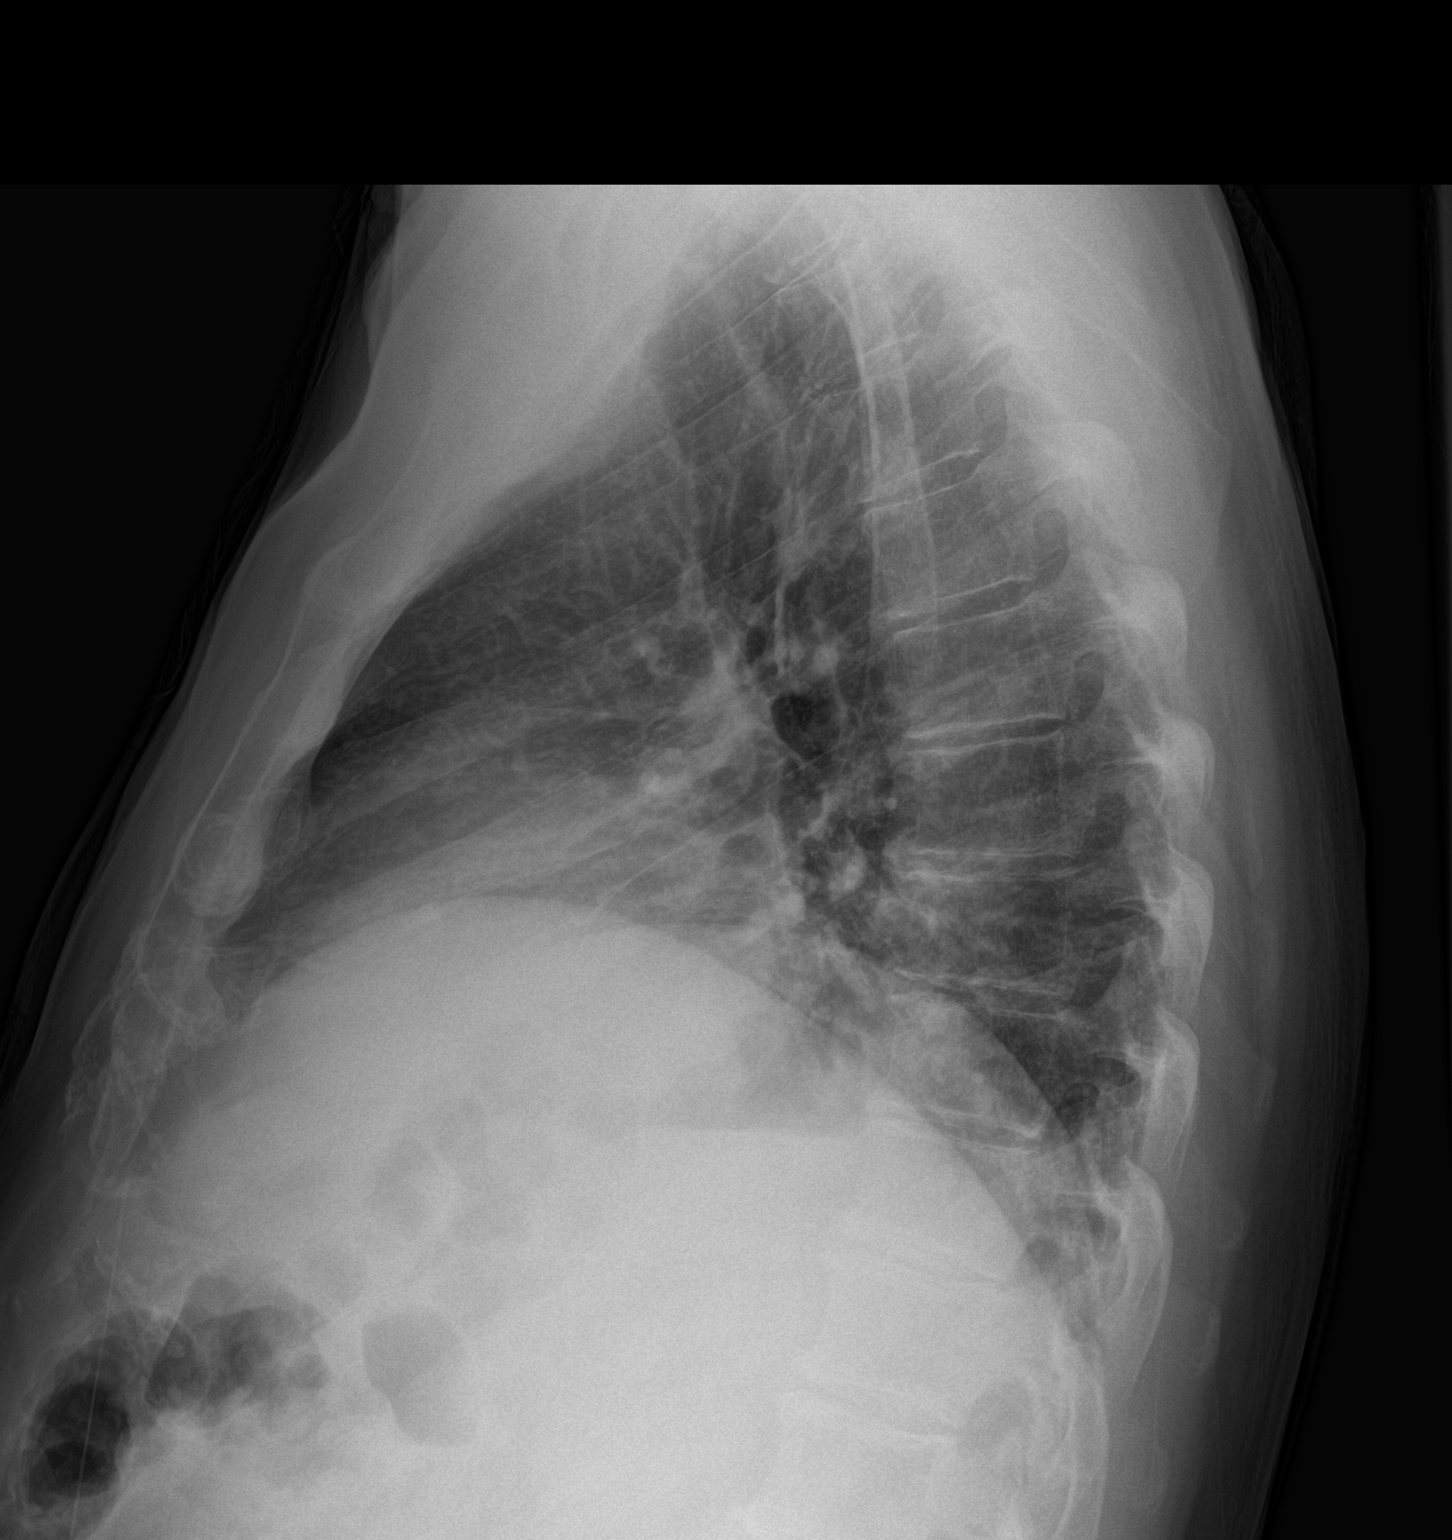

[2 of 2 positions shown; findings below may reference images not displayed]

FINDINGS: The heart size and mediastinal contours are stable. There are lower
lung volumes with slightly increased atelectasis at both lung bases,
especially on the lateral view. There is no confluent airspace
opacity or pleural effusion. The bones appear intact.
IMPRESSION: Mildly increased bibasilar atelectasis. No confluent airspace
opacity or edema.

## 2015-10-02 ENCOUNTER — Ambulatory Visit (HOSPITAL_BASED_OUTPATIENT_CLINIC_OR_DEPARTMENT_OTHER): Payer: Medicare Other | Admitting: Family

## 2015-10-02 ENCOUNTER — Other Ambulatory Visit (HOSPITAL_BASED_OUTPATIENT_CLINIC_OR_DEPARTMENT_OTHER): Payer: Medicare Other

## 2015-10-02 ENCOUNTER — Ambulatory Visit (HOSPITAL_BASED_OUTPATIENT_CLINIC_OR_DEPARTMENT_OTHER): Payer: Medicare Other

## 2015-10-02 ENCOUNTER — Encounter: Payer: Self-pay | Admitting: Family

## 2015-10-02 VITALS — BP 126/79 | HR 83 | Temp 98.4°F | Resp 14 | Ht 66.0 in | Wt 167.0 lb

## 2015-10-02 VITALS — BP 130/80 | HR 78

## 2015-10-02 DIAGNOSIS — D45 Polycythemia vera: Secondary | ICD-10-CM | POA: Diagnosis not present

## 2015-10-02 LAB — FERRITIN: Ferritin: 20 ng/ml — ABNORMAL LOW (ref 22–316)

## 2015-10-02 LAB — CMP (CANCER CENTER ONLY)
ALT(SGPT): 31 U/L (ref 10–47)
AST: 30 U/L (ref 11–38)
Albumin: 3.9 g/dL (ref 3.3–5.5)
Alkaline Phosphatase: 47 U/L (ref 26–84)
BUN, Bld: 12 mg/dL (ref 7–22)
CO2: 26 mEq/L (ref 18–33)
Calcium: 9.2 mg/dL (ref 8.0–10.3)
Chloride: 91 mEq/L — ABNORMAL LOW (ref 98–108)
Creat: 0.8 mg/dl (ref 0.6–1.2)
Glucose, Bld: 98 mg/dL (ref 73–118)
Potassium: 3.7 mEq/L (ref 3.3–4.7)
Sodium: 132 mEq/L (ref 128–145)
Total Bilirubin: 1.6 mg/dl (ref 0.20–1.60)
Total Protein: 7.3 g/dL (ref 6.4–8.1)

## 2015-10-02 LAB — CBC WITH DIFFERENTIAL (CANCER CENTER ONLY)
BASO#: 0 10*3/uL (ref 0.0–0.2)
BASO%: 0.2 % (ref 0.0–2.0)
EOS%: 0.2 % (ref 0.0–7.0)
Eosinophils Absolute: 0 10*3/uL (ref 0.0–0.5)
HCT: 45.7 % (ref 38.7–49.9)
HGB: 15 g/dL (ref 13.0–17.1)
LYMPH#: 1.8 10*3/uL (ref 0.9–3.3)
LYMPH%: 22.1 % (ref 14.0–48.0)
MCH: 27.8 pg — ABNORMAL LOW (ref 28.0–33.4)
MCHC: 32.8 g/dL (ref 32.0–35.9)
MCV: 85 fL (ref 82–98)
MONO#: 1 10*3/uL — ABNORMAL HIGH (ref 0.1–0.9)
MONO%: 12.7 % (ref 0.0–13.0)
NEUT#: 5.3 10*3/uL (ref 1.5–6.5)
NEUT%: 64.8 % (ref 40.0–80.0)
Platelets: 279 10*3/uL (ref 145–400)
RBC: 5.4 10*6/uL (ref 4.20–5.70)
RDW: 14.9 % (ref 11.1–15.7)
WBC: 8.1 10*3/uL (ref 4.0–10.0)

## 2015-10-02 LAB — IRON AND TIBC
%SAT: 14 % — ABNORMAL LOW (ref 20–55)
Iron: 55 ug/dL (ref 42–163)
TIBC: 394 ug/dL (ref 202–409)
UIBC: 339 ug/dL (ref 117–376)

## 2015-10-02 NOTE — Patient Instructions (Signed)
Therapeutic Phlebotomy, Care After  Refer to this sheet in the next few weeks. These instructions provide you with information about caring for yourself after your procedure. Your health care provider may also give you more specific instructions. Your treatment has been planned according to current medical practices, but problems sometimes occur. Call your health care provider if you have any problems or questions after your procedure.  WHAT TO EXPECT AFTER THE PROCEDURE  After your procedure, it is common to have:   Light-headedness or dizziness. You may feel faint.   Nausea.   Tiredness.  HOME CARE INSTRUCTIONS  Activities   Return to your normal activities as directed by your health care provider. Most people can go back to their normal activities right away.   Avoid strenuous physical activity and heavy lifting or pulling for about 5 hours after the procedure. Do not lift anything that is heavier than 10 lb (4.5 kg).   Athletes should avoid strenuous exercise for at least 12 hours.   Change positions slowly for the remainder of the day. This will help to prevent light-headedness or fainting.   If you feel light-headed, lie down until the feeling goes away.  Eating and Drinking   Be sure to eat well-balanced meals for the next 24 hours.   Drink enough fluid to keep your urine clear or pale yellow.   Avoid drinking alcohol on the day that you had the procedure.  Care of the Needle Insertion Site   Keep your bandage dry. You can remove the bandage after about 5 hours or as directed by your health care provider.   If you have bleeding from the needle insertion site, elevate your arm and press firmly on the site until the bleeding stops.   If you have bruising at the site, apply ice to the area:   Put ice in a plastic bag.   Place a towel between your skin and the bag.   Leave the ice on for 20 minutes, 2-3 times a day for the first 24 hours.   If the swelling does not go away after 24 hours, apply  a warm, moist washcloth to the area for 20 minutes, 2-3 times a day.  General Instructions   Avoid smoking for at least 30 minutes after the procedure.   Keep all follow-up visits as directed by your health care provider. It is important to continue with further therapeutic phlebotomy treatments as directed.  SEEK MEDICAL CARE IF:   You have redness, swelling, or pain at the needle insertion site.   You have fluid, blood, or pus coming from the needle insertion site.   You feel light-headed, dizzy, or nauseated, and the feeling does not go away.   You notice new bruising at the needle insertion site.   You feel weaker than normal.   You have a fever or chills.  SEEK IMMEDIATE MEDICAL CARE IF:   You have severe nausea or vomiting.   You have chest pain.   You have trouble breathing.    This information is not intended to replace advice given to you by your health care provider. Make sure you discuss any questions you have with your health care provider.    Document Released: 02/11/2011 Document Revised: 01/24/2015 Document Reviewed: 09/05/2014  [employer]vier Interactive Patient Education 2016 [employer]

## 2015-10-02 NOTE — Progress Notes (Signed)
Hematology and Oncology Follow Up Visit  Jesse [name] [id number] 24-May-1941 75 y.o. 10/02/2015   Principle Diagnosis:  Polycythemia vera- JAK2 negative  Current Therapy:   Phlebotomy to maintain hematocrit below 45% Aspirin 81 mg by mouth daily    Interim History:  [name] is here today for a follow-up. He is doing well and responded nicely to his 2 phlebotomies in November. His Hct is now down to 45.7. He has no complaints at this time.  No fever, n/v, cough, rash, dizziness, headaches, blurred vision, SOB, chest pain, palpitations, abdominal pain, changes in bowel or bladder habits.  No swelling, tenderness, numbness or tingling in his extremities. No c/o joint aches or pains.  He has maintained a good appetite and is staying well hydrated. His weight is unchanged.   Medications:    Medication List       This list is accurate as of: 10/02/15 11:26 AM.  Always use your most recent med list.               ALPRAZolam 0.5 MG tablet  Commonly known as:  XANAX  Take 0.25 mg by mouth at bedtime as needed. For sleep     aspirin EC 81 MG tablet  Take 81 mg by mouth daily.     cholecalciferol 1000 units tablet  Commonly known as:  VITAMIN D  Take 1,000 Units by mouth daily.     fish oil-omega-3 fatty acids 1000 MG capsule  Take 1 g by mouth daily.     lisinopril-hydrochlorothiazide 20-12.5 MG tablet  Commonly known as:  PRINZIDE,ZESTORETIC  Take 1 tablet by mouth daily.     sertraline 25 MG tablet  Commonly known as:  ZOLOFT  Take 25 mg by mouth daily.     simvastatin 20 MG tablet  Commonly known as:  ZOCOR  Take 20 mg by mouth at bedtime.        Allergies: No Known Allergies  Past Medical History, Surgical history, Social history, and Family History were reviewed and updated.  Review of Systems: All other 10 point review of systems is negative.   Physical Exam:  height is 5\' 6"  (1.676 m) and weight is 167 lb (75.751 kg). His oral temperature is 98.4 F  (36.9 C). His blood pressure is 126/79 and his pulse is 83. His respiration is 14.   Wt Readings from Last 3 Encounters:  10/02/15 167 lb (75.751 kg)  07/25/15 168 lb (76.204 kg)  03/20/15 169 lb (76.658 kg)    Ocular: Sclerae unicteric, pupils equal, round and reactive to light Ear-nose-throat: Oropharynx clear, dentition fair Lymphatic: No cervical supraclavicular or axillary adenopathy Lungs no rales or rhonchi, good excursion bilaterally Heart regular rate and rhythm, no murmur appreciated Abd soft, nontender, positive bowel sounds, no liver or spleen tip palpated on exam MSK no focal spinal tenderness, no joint edema Neuro: non-focal, well-oriented, appropriate affect Breasts: Deferred  Lab Results  Component Value Date   WBC 8.1 10/02/2015   HGB 15.0 10/02/2015   HCT 45.7 10/02/2015   MCV 85 10/02/2015   PLT 279 10/02/2015   Lab Results  Component Value Date   FERRITIN 21* 07/25/2015   IRON 70 07/25/2015   TIBC 363 07/25/2015   UIBC 293 07/25/2015   IRONPCTSAT 19* 07/25/2015   Lab Results  Component Value Date   RETICCTPCT 1.6 01/02/2011   RBC 5.40 10/02/2015   RETICCTABS 87.5 01/02/2011   No results found for: KPAFRELGTCHN, LAMBDASER, KAPLAMBRATIO No results found for:  IGGSERUM, IGA, IGMSERUM No results found for: Kathrynn Ducking, MSPIKE, SPEI   Chemistry      Component Value Date/Time   NA 135* 07/25/2015 0802   NA 131* 05/25/2015 0840   NA 136 12/26/2014 0851   K 3.6 07/25/2015 0802   K 3.7 05/25/2015 0840   K 3.6 12/26/2014 0851   CL 95* 05/25/2015 0840   CL 93* 12/26/2014 0851   CO2 27 07/25/2015 0802   CO2 24 05/25/2015 0840   CO2 27 12/26/2014 0851   BUN 10.7 07/25/2015 0802   BUN 10 05/25/2015 0840   BUN 11 12/26/2014 0851   CREATININE 0.8 07/25/2015 0802   CREATININE 0.75 05/25/2015 0840   CREATININE 1.0 12/26/2014 0851      Component Value Date/Time   CALCIUM 10.2 07/25/2015 0802   CALCIUM  9.8 05/25/2015 0840   CALCIUM 9.8 12/26/2014 0851   ALKPHOS 59 07/25/2015 0802   ALKPHOS 51 05/25/2015 0840   ALKPHOS 51 12/26/2014 0851   AST 23 07/25/2015 0802   AST 19 05/25/2015 0840   AST 23 12/26/2014 0851   ALT 31 07/25/2015 0802   ALT 27 05/25/2015 0840   ALT 26 12/26/2014 0851   BILITOT 1.22* 07/25/2015 0802   BILITOT 1.6* 05/25/2015 0840   BILITOT 1.20 12/26/2014 0851     Impression and Plan: Mr. [name] is 75 year old gentleman with polycythemia vera. He has no complaints and is asymptomatic at this time.  His Hct today is 45.7 so we will phlebotomize him today.  We will plan to see him back in 2 months for labs and follow-up.  He knows to contact Jesse [name] with any questions or concerns. We can certainly see him sooner if need be.   Eliezer Bottom, NP 1/9/201711:26 AM

## 2015-10-02 NOTE — Progress Notes (Signed)
Jesse [name] presents today for phlebotomy per MD orders. Phlebotomy procedure started at 1127 and ended at 1138. 500 ml grams removed. Patient observed for 30 minutes after procedure without any incident. Patient tolerated procedure well. IV needle removed intact.

## 2015-10-11 DIAGNOSIS — J069 Acute upper respiratory infection, unspecified: Secondary | ICD-10-CM | POA: Diagnosis not present

## 2015-10-11 DIAGNOSIS — J309 Allergic rhinitis, unspecified: Secondary | ICD-10-CM | POA: Diagnosis not present

## 2015-11-29 DIAGNOSIS — F411 Generalized anxiety disorder: Secondary | ICD-10-CM | POA: Diagnosis not present

## 2015-11-29 DIAGNOSIS — G4700 Insomnia, unspecified: Secondary | ICD-10-CM | POA: Diagnosis not present

## 2015-12-04 ENCOUNTER — Ambulatory Visit (HOSPITAL_BASED_OUTPATIENT_CLINIC_OR_DEPARTMENT_OTHER): Payer: Medicare Other

## 2015-12-04 ENCOUNTER — Other Ambulatory Visit (HOSPITAL_BASED_OUTPATIENT_CLINIC_OR_DEPARTMENT_OTHER): Payer: Medicare Other

## 2015-12-04 ENCOUNTER — Encounter: Payer: Self-pay | Admitting: Family

## 2015-12-04 ENCOUNTER — Ambulatory Visit (HOSPITAL_BASED_OUTPATIENT_CLINIC_OR_DEPARTMENT_OTHER): Payer: Medicare Other | Admitting: Family

## 2015-12-04 VITALS — BP 124/74 | HR 88 | Temp 98.2°F | Resp 18

## 2015-12-04 VITALS — BP 129/76 | HR 88 | Temp 97.8°F | Resp 16 | Ht 66.0 in | Wt 166.0 lb

## 2015-12-04 DIAGNOSIS — D45 Polycythemia vera: Secondary | ICD-10-CM

## 2015-12-04 LAB — COMPREHENSIVE METABOLIC PANEL
ALT: 29 U/L (ref 0–55)
AST: 23 U/L (ref 5–34)
Albumin: 3.6 g/dL (ref 3.5–5.0)
Alkaline Phosphatase: 60 U/L (ref 40–150)
Anion Gap: 8 mEq/L (ref 3–11)
BUN: 14.5 mg/dL (ref 7.0–26.0)
CO2: 26 mEq/L (ref 22–29)
Calcium: 10 mg/dL (ref 8.4–10.4)
Chloride: 99 mEq/L (ref 98–109)
Creatinine: 0.9 mg/dL (ref 0.7–1.3)
EGFR: 86 mL/min/{1.73_m2} — ABNORMAL LOW (ref >90)
Glucose: 94 mg/dl (ref 70–140)
Potassium: 3.7 mEq/L (ref 3.5–5.1)
Sodium: 134 mEq/L — ABNORMAL LOW (ref 136–145)
Total Bilirubin: 0.94 mg/dL (ref 0.20–1.20)
Total Protein: 7 g/dL (ref 6.4–8.3)

## 2015-12-04 LAB — FERRITIN: Ferritin: 21 ng/ml — ABNORMAL LOW (ref 22–316)

## 2015-12-04 LAB — CBC WITH DIFFERENTIAL (CANCER CENTER ONLY)
BASO#: 0 10*3/uL (ref 0.0–0.2)
BASO%: 0.4 % (ref 0.0–2.0)
EOS%: 1.1 % (ref 0.0–7.0)
Eosinophils Absolute: 0.1 10*3/uL (ref 0.0–0.5)
HCT: 46.1 % (ref 38.7–49.9)
HGB: 15 g/dL (ref 13.0–17.1)
LYMPH#: 2.2 10*3/uL (ref 0.9–3.3)
LYMPH%: 28.9 % (ref 14.0–48.0)
MCH: 27.2 pg — ABNORMAL LOW (ref 28.0–33.4)
MCHC: 32.5 g/dL (ref 32.0–35.9)
MCV: 84 fL (ref 82–98)
MONO#: 1.3 10*3/uL — ABNORMAL HIGH (ref 0.1–0.9)
MONO%: 17 % — ABNORMAL HIGH (ref 0.0–13.0)
NEUT#: 4 10*3/uL (ref 1.5–6.5)
NEUT%: 52.6 % (ref 40.0–80.0)
Platelets: 233 10*3/uL (ref 145–400)
RBC: 5.51 10*6/uL (ref 4.20–5.70)
RDW: 16.9 % — ABNORMAL HIGH (ref 11.1–15.7)
WBC: 7.5 10*3/uL (ref 4.0–10.0)

## 2015-12-04 LAB — IRON AND TIBC
%SAT: 16 % — ABNORMAL LOW (ref 20–55)
Iron: 57 ug/dL (ref 42–163)
TIBC: 366 ug/dL (ref 202–409)
UIBC: 309 ug/dL (ref 117–376)

## 2015-12-04 NOTE — Patient Instructions (Signed)

## 2015-12-04 NOTE — Progress Notes (Signed)
Hematology and Oncology Follow Up Visit  Jesse [name] [id number] [birth year] 75 y.o. 12/04/2015   Principle Diagnosis:  Polycythemia vera- JAK2 negative  Current Therapy:   Phlebotomy to maintain hematocrit below 45% Aspirin 81 mg by mouth daily    Interim History:  Jesse [name] is here today for a follow-up. He has had some mild fatigue at times. 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   No fever, chills, n/v, cough, rash, dizziness, headaches, blurred vision, SOB, chest pain, palpitations, abdominal pain or changes in bowel or bladder habits.  No swelling, tenderness, numbness or tingling in his extremities. He has some arthritic pain in his hands and right ankle that will flare with the changes in weather.   He is eating healthy and trying to lose weight. He is staying well hydrated. His weight is stable.    Medications:    Medication List       This list is accurate as of: 12/04/15  9:53 AM.  Always use your most recent med list.               ALPRAZolam 0.5 MG tablet  Commonly known as:  XANAX  Take 0.25 mg by mouth at bedtime as needed. For sleep     aspirin EC 81 MG tablet  Take 81 mg by mouth daily.     cholecalciferol 1000 units tablet  Commonly known as:  VITAMIN D  Take 1,000 Units by mouth daily.     fish oil-omega-3 fatty acids 1000 MG capsule  Take 1 g by mouth daily.     lisinopril-hydrochlorothiazide 20-12.5 MG tablet  Commonly known as:  PRINZIDE,ZESTORETIC  Take 1 tablet by mouth daily.     simvastatin 20 MG tablet  Commonly known as:  ZOCOR  Take 20 mg by mouth at bedtime.        Allergies: No Known Allergies  Past Medical History, Surgical history, Social history, and Family History were reviewed and updated.  Review of Systems: All other 10 point review of systems is negative.   Physical Exam:  height is 5\' 6"  (1.676 m) and weight is 166 lb (75.297 kg). His oral temperature is 97.8 F (36.6 C).  His blood pressure is 129/76 and his pulse is 88. His respiration is 16.   Wt Readings from Last 3 Encounters:  12/04/15 166 lb (75.297 kg)  10/02/15 167 lb (75.751 kg)  07/25/15 168 lb (76.204 kg)    Ocular: Sclerae unicteric, pupils equal, round and reactive to light Ear-nose-throat: Oropharynx clear, dentition fair Lymphatic: No cervical supraclavicular or axillary adenopathy Lungs no rales or rhonchi, good excursion bilaterally Heart regular rate and rhythm, no murmur appreciated Abd soft, nontender, positive bowel sounds, no liver or spleen tip palpated on exam, no fluid wave MSK no focal spinal tenderness, no joint edema Neuro: non-focal, well-oriented, appropriate affect Breasts: Deferred  Lab Results  Component Value Date   WBC 7.5 12/04/2015   HGB 15.0 12/04/2015   HCT 46.1 12/04/2015   MCV 84 12/04/2015   PLT 233 12/04/2015   Lab Results  Component Value Date   FERRITIN 20* 10/02/2015   IRON 55 10/02/2015   TIBC 394 10/02/2015   UIBC 339 10/02/2015   IRONPCTSAT 14* 10/02/2015   Lab Results  Component Value Date   RETICCTPCT 1.6 01/02/2011   RBC 5.51 12/04/2015   RETICCTABS 87.5 01/02/2011   No results found for: KPAFRELGTCHN, LAMBDASER, KAPLAMBRATIO No results found for:  IGGSERUM, IGA, IGMSERUM No results found for: Kathrynn Ducking, MSPIKE, SPEI   Chemistry      Component Value Date/Time   NA 132 10/02/2015 1056   NA 135* 07/25/2015 0802   NA 131* 05/25/2015 0840   K 3.7 10/02/2015 1056   K 3.6 07/25/2015 0802   K 3.7 05/25/2015 0840   CL 91* 10/02/2015 1056   CL 95* 05/25/2015 0840   CO2 26 10/02/2015 1056   CO2 27 07/25/2015 0802   CO2 24 05/25/2015 0840   BUN 12 10/02/2015 1056   BUN 10.7 07/25/2015 0802   BUN 10 05/25/2015 0840   CREATININE 0.8 10/02/2015 1056   CREATININE 0.8 07/25/2015 0802   CREATININE 0.75 05/25/2015 0840      Component Value Date/Time   CALCIUM 9.2 10/02/2015 1056   CALCIUM  10.2 07/25/2015 0802   CALCIUM 9.8 05/25/2015 0840   ALKPHOS 47 10/02/2015 1056   ALKPHOS 59 07/25/2015 0802   ALKPHOS 51 05/25/2015 0840   AST 30 10/02/2015 1056   AST 23 07/25/2015 0802   AST 19 05/25/2015 0840   ALT 31 10/02/2015 1056   ALT 31 07/25/2015 0802   ALT 27 05/25/2015 0840   BILITOT 1.60 10/02/2015 1056   BILITOT 1.22* 07/25/2015 0802   BILITOT 1.6* 05/25/2015 0840     Impression and Plan: Jesse [name] is 75 year old gentleman with polycythemia vera. He is symptomatic with mild fatigue at times. His Hct today is 46.1 so we will go ahead and phlebotomize him.  He will continue on his 1 baby aspirin daily.  We will plan to see him back in 2 months for labs and follow-up.  He knows to contact us with any questions or concerns. We can certainly see him sooner if need be.   Eliezer Bottom, NP 3/13/20179:53 AM

## 2015-12-18 DIAGNOSIS — K642 Third degree hemorrhoids: Secondary | ICD-10-CM | POA: Diagnosis not present

## 2016-01-02 DIAGNOSIS — K5900 Constipation, unspecified: Secondary | ICD-10-CM | POA: Diagnosis not present

## 2016-01-02 DIAGNOSIS — K642 Third degree hemorrhoids: Secondary | ICD-10-CM | POA: Diagnosis not present

## 2016-02-06 ENCOUNTER — Ambulatory Visit (HOSPITAL_BASED_OUTPATIENT_CLINIC_OR_DEPARTMENT_OTHER): Payer: Medicare Other

## 2016-02-06 ENCOUNTER — Encounter: Payer: Self-pay | Admitting: Family

## 2016-02-06 ENCOUNTER — Other Ambulatory Visit (HOSPITAL_BASED_OUTPATIENT_CLINIC_OR_DEPARTMENT_OTHER): Payer: Medicare Other

## 2016-02-06 ENCOUNTER — Ambulatory Visit (HOSPITAL_BASED_OUTPATIENT_CLINIC_OR_DEPARTMENT_OTHER): Payer: Medicare Other | Admitting: Family

## 2016-02-06 VITALS — BP 119/78 | HR 88 | Resp 16

## 2016-02-06 VITALS — BP 129/83 | HR 86 | Temp 98.1°F | Resp 14 | Ht 66.0 in | Wt 163.0 lb

## 2016-02-06 DIAGNOSIS — D45 Polycythemia vera: Secondary | ICD-10-CM | POA: Diagnosis not present

## 2016-02-06 LAB — CBC WITH DIFFERENTIAL (CANCER CENTER ONLY)
BASO#: 0 10*3/uL (ref 0.0–0.2)
BASO%: 0.6 % (ref 0.0–2.0)
EOS%: 1.5 % (ref 0.0–7.0)
Eosinophils Absolute: 0.1 10*3/uL (ref 0.0–0.5)
HCT: 45.8 % (ref 38.7–49.9)
HGB: 15.2 g/dL (ref 13.0–17.1)
LYMPH#: 1.9 10*3/uL (ref 0.9–3.3)
LYMPH%: 26.2 % (ref 14.0–48.0)
MCH: 27.4 pg — ABNORMAL LOW (ref 28.0–33.4)
MCHC: 33.2 g/dL (ref 32.0–35.9)
MCV: 83 fL (ref 82–98)
MONO#: 1.1 10*3/uL — ABNORMAL HIGH (ref 0.1–0.9)
MONO%: 14.8 % — ABNORMAL HIGH (ref 0.0–13.0)
NEUT#: 4.1 10*3/uL (ref 1.5–6.5)
NEUT%: 56.9 % (ref 40.0–80.0)
Platelets: 294 10*3/uL (ref 145–400)
RBC: 5.55 10*6/uL (ref 4.20–5.70)
RDW: 16.7 % — ABNORMAL HIGH (ref 11.1–15.7)
WBC: 7.2 10*3/uL (ref 4.0–10.0)

## 2016-02-06 LAB — FERRITIN: Ferritin: 16 ng/ml — ABNORMAL LOW (ref 22–316)

## 2016-02-06 LAB — COMPREHENSIVE METABOLIC PANEL
ALT: 29 U/L (ref 0–55)
AST: 24 U/L (ref 5–34)
Albumin: 3.9 g/dL (ref 3.5–5.0)
Alkaline Phosphatase: 55 U/L (ref 40–150)
Anion Gap: 8 mEq/L (ref 3–11)
BUN: 12.3 mg/dL (ref 7.0–26.0)
CO2: 26 mEq/L (ref 22–29)
Calcium: 10.2 mg/dL (ref 8.4–10.4)
Chloride: 99 mEq/L (ref 98–109)
Creatinine: 0.8 mg/dL (ref 0.7–1.3)
EGFR: 87 mL/min/{1.73_m2} — ABNORMAL LOW (ref >90)
Glucose: 100 mg/dl (ref 70–140)
Potassium: 4.1 mEq/L (ref 3.5–5.1)
Sodium: 132 mEq/L — ABNORMAL LOW (ref 136–145)
Total Bilirubin: 1.21 mg/dL — ABNORMAL HIGH (ref 0.20–1.20)
Total Protein: 7.2 g/dL (ref 6.4–8.3)

## 2016-02-06 LAB — IRON AND TIBC
%SAT: 12 % — ABNORMAL LOW (ref 20–55)
Iron: 44 ug/dL (ref 42–163)
TIBC: 374 ug/dL (ref 202–409)
UIBC: 329 ug/dL (ref 117–376)

## 2016-02-06 NOTE — Patient Instructions (Signed)

## 2016-02-06 NOTE — Progress Notes (Signed)
Jesse [name] presents today for phlebotomy per MD orders. Phlebotomy procedure started at 1120 and ended at 1130. 500 grams removed. Patient observed for 30 minutes after procedure without any incident. Patient tolerated procedure well. IV needle removed intact.

## 2016-02-06 NOTE — Progress Notes (Signed)
Hematology and Oncology Follow Up Visit  Jesse [name] [id number] 02/02/1941 75 y.o. 02/06/2016   Principle Diagnosis:  Polycythemia vera- JAK2 negative  Current Therapy:   Phlebotomy to maintain hematocrit below 45% Aspirin 81 mg by mouth daily    Interim History:  Jesse [name] is here today for a follow-up. He is doing well and enjoying the nice weather. He is asymptomatic at this time.  No fatigue, fever, chills, n/v, cough, rash, dizziness, headaches, blurred vision, SOB, chest pain, palpitations, abdominal pain or changes in bowel or bladder habits.  No swelling, tenderness, numbness or tingling in his extremities. He has some arthritic pain in his hands and right ankle that comes and goes.  He has maintained a good appetite and is staying well hydrated. His weight is stable.   Medications:    Medication List       This list is accurate as of: 02/06/16 11:06 AM.  Always use your most recent med list.               ALPRAZolam 0.5 MG tablet  Commonly known as:  XANAX  Take 0.25 mg by mouth at bedtime as needed. For sleep     aspirin EC 81 MG tablet  Take 81 mg by mouth daily.     b complex vitamins capsule  Take 1 capsule by mouth daily.     cholecalciferol 1000 units tablet  Commonly known as:  VITAMIN D  Take 1,000 Units by mouth daily.     fish oil-omega-3 fatty acids 1000 MG capsule  Take 1 g by mouth daily.     lisinopril-hydrochlorothiazide 20-12.5 MG tablet  Commonly known as:  PRINZIDE,ZESTORETIC  Take 1 tablet by mouth daily.     simvastatin 20 MG tablet  Commonly known as:  ZOCOR  Take 20 mg by mouth at bedtime.        Allergies: No Known Allergies  Past Medical History, Surgical history, Social history, and Family History were reviewed and updated.  Review of Systems: All other 10 point review of systems is negative.   Physical Exam:  height is 5\' 6"  (1.676 m) and weight is 163 lb (73.936 kg). His oral temperature is 98.1 F (36.7 C). His  blood pressure is 129/83 and his pulse is 86. His respiration is 14.   Wt Readings from Last 3 Encounters:  02/06/16 163 lb (73.936 kg)  12/04/15 166 lb (75.297 kg)  10/02/15 167 lb (75.751 kg)    Ocular: Sclerae unicteric, pupils equal, round and reactive to light Ear-nose-throat: Oropharynx clear, dentition fair Lymphatic: No cervical supraclavicular or axillary adenopathy Lungs no rales or rhonchi, good excursion bilaterally Heart regular rate and rhythm, no murmur appreciated Abd soft, nontender, positive bowel sounds, no liver or spleen tip palpated on exam, no fluid wave MSK no focal spinal tenderness, no joint edema Neuro: non-focal, well-oriented, appropriate affect Breasts: Deferred  Lab Results  Component Value Date   WBC 7.2 02/06/2016   HGB 15.2 02/06/2016   HCT 45.8 02/06/2016   MCV 83 02/06/2016   PLT 294 02/06/2016   Lab Results  Component Value Date   FERRITIN 21* 12/04/2015   IRON 57 12/04/2015   TIBC 366 12/04/2015   UIBC 309 12/04/2015   IRONPCTSAT 16* 12/04/2015   Lab Results  Component Value Date   RETICCTPCT 1.6 01/02/2011   RBC 5.55 02/06/2016   RETICCTABS 87.5 01/02/2011   No results found for: KPAFRELGTCHN, LAMBDASER, KAPLAMBRATIO No results found for: IGGSERUM, IGA, IGMSERUM  No results found for: Kathrynn Ducking, MSPIKE, SPEI   Chemistry      Component Value Date/Time   NA 134* 12/04/2015 0931   NA 132 10/02/2015 1056   NA 131* 05/25/2015 0840   K 3.7 12/04/2015 0931   K 3.7 10/02/2015 1056   K 3.7 05/25/2015 0840   CL 91* 10/02/2015 1056   CL 95* 05/25/2015 0840   CO2 26 12/04/2015 0931   CO2 26 10/02/2015 1056   CO2 24 05/25/2015 0840   BUN 14.5 12/04/2015 0931   BUN 12 10/02/2015 1056   BUN 10 05/25/2015 0840   CREATININE 0.9 12/04/2015 0931   CREATININE 0.8 10/02/2015 1056   CREATININE 0.75 05/25/2015 0840      Component Value Date/Time   CALCIUM 10.0 12/04/2015 0931   CALCIUM  9.2 10/02/2015 1056   CALCIUM 9.8 05/25/2015 0840   ALKPHOS 60 12/04/2015 0931   ALKPHOS 47 10/02/2015 1056   ALKPHOS 51 05/25/2015 0840   AST 23 12/04/2015 0931   AST 30 10/02/2015 1056   AST 19 05/25/2015 0840   ALT 29 12/04/2015 0931   ALT 31 10/02/2015 1056   ALT 27 05/25/2015 0840   BILITOT 0.94 12/04/2015 0931   BILITOT 1.60 10/02/2015 1056   BILITOT 1.6* 05/25/2015 0840     Impression and Plan: Jesse [name] is 75 year old gentleman with polycythemia vera. He is asymptomatic at this time. His Hct today is 45.8 so we will go ahead and phlebotomize him.  He will continue on his 1 baby aspirin daily.  We will plan to see him back in 2 months for labs and follow-up.  He knows to contact us with any questions or concerns. We can certainly see him sooner if need be.   Eliezer Bottom, NP 5/16/201711:06 AM

## 2016-03-14 ENCOUNTER — Ambulatory Visit (INDEPENDENT_AMBULATORY_CARE_PROVIDER_SITE_OTHER): Payer: Medicare Other | Admitting: Ophthalmology

## 2016-03-14 DIAGNOSIS — H33301 Unspecified retinal break, right eye: Secondary | ICD-10-CM

## 2016-03-14 DIAGNOSIS — I10 Essential (primary) hypertension: Secondary | ICD-10-CM | POA: Diagnosis not present

## 2016-03-14 DIAGNOSIS — H35033 Hypertensive retinopathy, bilateral: Secondary | ICD-10-CM

## 2016-03-14 DIAGNOSIS — H43813 Vitreous degeneration, bilateral: Secondary | ICD-10-CM

## 2016-03-14 DIAGNOSIS — H338 Other retinal detachments: Secondary | ICD-10-CM

## 2016-03-14 DIAGNOSIS — H2513 Age-related nuclear cataract, bilateral: Secondary | ICD-10-CM | POA: Diagnosis not present

## 2016-04-08 ENCOUNTER — Ambulatory Visit (HOSPITAL_BASED_OUTPATIENT_CLINIC_OR_DEPARTMENT_OTHER): Payer: Medicare Other

## 2016-04-08 ENCOUNTER — Encounter: Payer: Self-pay | Admitting: Hematology & Oncology

## 2016-04-08 ENCOUNTER — Ambulatory Visit (HOSPITAL_BASED_OUTPATIENT_CLINIC_OR_DEPARTMENT_OTHER): Payer: Medicare Other | Admitting: Family

## 2016-04-08 ENCOUNTER — Other Ambulatory Visit (HOSPITAL_BASED_OUTPATIENT_CLINIC_OR_DEPARTMENT_OTHER): Payer: Medicare Other

## 2016-04-08 VITALS — BP 107/66 | HR 92 | Resp 16

## 2016-04-08 VITALS — BP 128/76 | HR 84 | Temp 98.4°F | Resp 16 | Ht 66.0 in | Wt 162.0 lb

## 2016-04-08 DIAGNOSIS — D45 Polycythemia vera: Secondary | ICD-10-CM

## 2016-04-08 DIAGNOSIS — D500 Iron deficiency anemia secondary to blood loss (chronic): Secondary | ICD-10-CM

## 2016-04-08 LAB — IRON AND TIBC
%SAT: 16 % — ABNORMAL LOW (ref 20–55)
Iron: 60 ug/dL (ref 42–163)
TIBC: 381 ug/dL (ref 202–409)
UIBC: 321 ug/dL (ref 117–376)

## 2016-04-08 LAB — CBC WITH DIFFERENTIAL (CANCER CENTER ONLY)
BASO#: 0 10*3/uL (ref 0.0–0.2)
BASO%: 0.4 % (ref 0.0–2.0)
EOS%: 0.7 % (ref 0.0–7.0)
Eosinophils Absolute: 0.1 10*3/uL (ref 0.0–0.5)
HCT: 46.1 % (ref 38.7–49.9)
HGB: 15.3 g/dL (ref 13.0–17.1)
LYMPH#: 2 10*3/uL (ref 0.9–3.3)
LYMPH%: 27.4 % (ref 14.0–48.0)
MCH: 27.7 pg — ABNORMAL LOW (ref 28.0–33.4)
MCHC: 33.2 g/dL (ref 32.0–35.9)
MCV: 83 fL (ref 82–98)
MONO#: 1.1 10*3/uL — ABNORMAL HIGH (ref 0.1–0.9)
MONO%: 15.5 % — ABNORMAL HIGH (ref 0.0–13.0)
NEUT#: 4.1 10*3/uL (ref 1.5–6.5)
NEUT%: 56 % (ref 40.0–80.0)
Platelets: 281 10*3/uL (ref 145–400)
RBC: 5.53 10*6/uL (ref 4.20–5.70)
RDW: 17 % — ABNORMAL HIGH (ref 11.1–15.7)
WBC: 7.4 10*3/uL (ref 4.0–10.0)

## 2016-04-08 LAB — COMPREHENSIVE METABOLIC PANEL
ALT: 24 U/L (ref 0–55)
AST: 21 U/L (ref 5–34)
Albumin: 3.7 g/dL (ref 3.5–5.0)
Alkaline Phosphatase: 59 U/L (ref 40–150)
Anion Gap: 10 mEq/L (ref 3–11)
BUN: 12.5 mg/dL (ref 7.0–26.0)
CO2: 25 mEq/L (ref 22–29)
Calcium: 10 mg/dL (ref 8.4–10.4)
Chloride: 101 mEq/L (ref 98–109)
Creatinine: 0.9 mg/dL (ref 0.7–1.3)
EGFR: 86 mL/min/{1.73_m2} — ABNORMAL LOW (ref >90)
Glucose: 96 mg/dl (ref 70–140)
Potassium: 3.9 mEq/L (ref 3.5–5.1)
Sodium: 135 mEq/L — ABNORMAL LOW (ref 136–145)
Total Bilirubin: 1.24 mg/dL — ABNORMAL HIGH (ref 0.20–1.20)
Total Protein: 7.2 g/dL (ref 6.4–8.3)

## 2016-04-08 LAB — FERRITIN: Ferritin: 15 ng/ml — ABNORMAL LOW (ref 22–316)

## 2016-04-08 NOTE — Progress Notes (Signed)
Hematology and Oncology Follow Up Visit  Jesse [name] [id number] 02/16/1941 75 y.o. 04/08/2016   Principle Diagnosis:  Polycythemia vera- JAK2 negative  Current Therapy:   Phlebotomy to maintain hematocrit below 45% Aspirin 81 mg by mouth daily    Interim History:  Jesse [name] is here today for a follow-up. He is feeling a little fatigued at times. It has been too hot outside for him to work in the yard.  He has maintained a good appetite and is staying well hydrated. His weight is stable.  No fever, chills, n/v, cough, rash, dizziness, headaches, blurred vision, SOB, chest pain, palpitations, abdominal pain or changes in bowel or bladder habits.  No lymphadenopathy found on exam. No episodes of bleeding or bruising.  No swelling, tenderness, numbness or tingling in his extremities. No c/o joint aches or "bone pain at this time.   Medications:    Medication List       This list is accurate as of: 04/08/16 10:50 AM.  Always use your most recent med list.               ALPRAZolam 0.5 MG tablet  Commonly known as:  XANAX  Take 0.25 mg by mouth at bedtime as needed. For sleep     aspirin EC 81 MG tablet  Take 81 mg by mouth daily.     b complex vitamins capsule  Take 1 capsule by mouth daily.     cholecalciferol 1000 units tablet  Commonly known as:  VITAMIN D  Take 1,000 Units by mouth daily.     fish oil-omega-3 fatty acids 1000 MG capsule  Take 1 g by mouth daily.     lisinopril-hydrochlorothiazide 20-12.5 MG tablet  Commonly known as:  PRINZIDE,ZESTORETIC  Take 1 tablet by mouth daily.     simvastatin 20 MG tablet  Commonly known as:  ZOCOR  Take 20 mg by mouth at bedtime.        Allergies: No Known Allergies  Past Medical History, Surgical history, Social history, and Family History were reviewed and updated.  Review of Systems: All other 10 point review of systems is negative.   Physical Exam:  height is 5\' 6"  (1.676 m) and weight is 162 lb (73.483  kg). His oral temperature is 98.4 F (36.9 C). His blood pressure is 128/76 and his pulse is 84. His respiration is 16.   Wt Readings from Last 3 Encounters:  04/08/16 162 lb (73.483 kg)  02/06/16 163 lb (73.936 kg)  12/04/15 166 lb (75.297 kg)    Ocular: Sclerae unicteric, pupils equal, round and reactive to light Ear-nose-throat: Oropharynx clear, dentition fair Lymphatic: No cervical supraclavicular or axillary adenopathy Lungs no rales or rhonchi, good excursion bilaterally Heart regular rate and rhythm, no murmur appreciated Abd soft, nontender, positive bowel sounds, no liver or spleen tip palpated on exam, no fluid wave MSK no focal spinal tenderness, no joint edema Neuro: non-focal, well-oriented, appropriate affect Breasts: Deferred  Lab Results  Component Value Date   WBC 7.4 04/08/2016   HGB 15.3 04/08/2016   HCT 46.1 04/08/2016   MCV 83 04/08/2016   PLT 281 04/08/2016   Lab Results  Component Value Date   FERRITIN 16* 02/06/2016   IRON 44 02/06/2016   TIBC 374 02/06/2016   UIBC 329 02/06/2016   IRONPCTSAT 12* 02/06/2016   Lab Results  Component Value Date   RETICCTPCT 1.6 01/02/2011   RBC 5.53 04/08/2016   RETICCTABS 87.5 01/02/2011   No results  found for: KPAFRELGTCHN, LAMBDASER, KAPLAMBRATIO No results found for: Kandis Cocking, IGMSERUM No results found for: Kathrynn Ducking, MSPIKE, SPEI   Chemistry      Component Value Date/Time   NA 132* 02/06/2016 1021   NA 132 10/02/2015 1056   NA 131* 05/25/2015 0840   K 4.1 02/06/2016 1021   K 3.7 10/02/2015 1056   K 3.7 05/25/2015 0840   CL 91* 10/02/2015 1056   CL 95* 05/25/2015 0840   CO2 26 02/06/2016 1021   CO2 26 10/02/2015 1056   CO2 24 05/25/2015 0840   BUN 12.3 02/06/2016 1021   BUN 12 10/02/2015 1056   BUN 10 05/25/2015 0840   CREATININE 0.8 02/06/2016 1021   CREATININE 0.8 10/02/2015 1056   CREATININE 0.75 05/25/2015 0840      Component Value  Date/Time   CALCIUM 10.2 02/06/2016 1021   CALCIUM 9.2 10/02/2015 1056   CALCIUM 9.8 05/25/2015 0840   ALKPHOS 55 02/06/2016 1021   ALKPHOS 47 10/02/2015 1056   ALKPHOS 51 05/25/2015 0840   AST 24 02/06/2016 1021   AST 30 10/02/2015 1056   AST 19 05/25/2015 0840   ALT 29 02/06/2016 1021   ALT 31 10/02/2015 1056   ALT 27 05/25/2015 0840   BILITOT 1.21* 02/06/2016 1021   BILITOT 1.60 10/02/2015 1056   BILITOT 1.6* 05/25/2015 0840     Impression and Plan: Jesse [name] is 76 year old gentleman with polycythemia vera. He is symptomatic with fatigue at times. His Hct today is 46.1 so we will go ahead and phlebotomize him.  He will continue on his 1 baby aspirin daily.  We will plan to see him back in 2 months for labs and follow-up.  He knows to contact us with any questions or concerns. We can certainly see him sooner if need be.   Eliezer Bottom, NP 7/17/201710:50 AM

## 2016-04-08 NOTE — Patient Instructions (Signed)

## 2016-04-11 DIAGNOSIS — I10 Essential (primary) hypertension: Secondary | ICD-10-CM | POA: Diagnosis not present

## 2016-04-11 DIAGNOSIS — D519 Vitamin B12 deficiency anemia, unspecified: Secondary | ICD-10-CM | POA: Diagnosis not present

## 2016-04-11 DIAGNOSIS — R7303 Prediabetes: Secondary | ICD-10-CM | POA: Diagnosis not present

## 2016-04-11 DIAGNOSIS — E538 Deficiency of other specified B group vitamins: Secondary | ICD-10-CM | POA: Diagnosis not present

## 2016-04-11 DIAGNOSIS — R7309 Other abnormal glucose: Secondary | ICD-10-CM | POA: Diagnosis not present

## 2016-04-11 DIAGNOSIS — D45 Polycythemia vera: Secondary | ICD-10-CM | POA: Diagnosis not present

## 2016-04-11 DIAGNOSIS — Z0000 Encounter for general adult medical examination without abnormal findings: Secondary | ICD-10-CM | POA: Diagnosis not present

## 2016-04-11 DIAGNOSIS — H6123 Impacted cerumen, bilateral: Secondary | ICD-10-CM | POA: Diagnosis not present

## 2016-06-10 ENCOUNTER — Other Ambulatory Visit: Payer: Medicare Other

## 2016-06-10 ENCOUNTER — Ambulatory Visit: Payer: Medicare Other | Admitting: Family

## 2016-06-12 ENCOUNTER — Ambulatory Visit: Payer: Medicare Other | Admitting: Family

## 2016-06-12 ENCOUNTER — Other Ambulatory Visit: Payer: Medicare Other

## 2016-06-17 ENCOUNTER — Ambulatory Visit (HOSPITAL_BASED_OUTPATIENT_CLINIC_OR_DEPARTMENT_OTHER): Payer: Medicare Other

## 2016-06-17 ENCOUNTER — Encounter: Payer: Self-pay | Admitting: Family

## 2016-06-17 ENCOUNTER — Other Ambulatory Visit (HOSPITAL_BASED_OUTPATIENT_CLINIC_OR_DEPARTMENT_OTHER): Payer: Medicare Other

## 2016-06-17 ENCOUNTER — Ambulatory Visit (HOSPITAL_BASED_OUTPATIENT_CLINIC_OR_DEPARTMENT_OTHER): Payer: Medicare Other | Admitting: Family

## 2016-06-17 VITALS — BP 125/76 | HR 79 | Temp 97.7°F | Resp 16 | Ht 66.0 in | Wt 165.0 lb

## 2016-06-17 DIAGNOSIS — D500 Iron deficiency anemia secondary to blood loss (chronic): Secondary | ICD-10-CM | POA: Diagnosis not present

## 2016-06-17 DIAGNOSIS — Z23 Encounter for immunization: Secondary | ICD-10-CM

## 2016-06-17 DIAGNOSIS — D45 Polycythemia vera: Secondary | ICD-10-CM | POA: Diagnosis not present

## 2016-06-17 LAB — CBC WITH DIFFERENTIAL (CANCER CENTER ONLY)
BASO#: 0 10*3/uL (ref 0.0–0.2)
BASO%: 0.3 % (ref 0.0–2.0)
EOS%: 1.2 % (ref 0.0–7.0)
Eosinophils Absolute: 0.1 10*3/uL (ref 0.0–0.5)
HCT: 44 % (ref 38.7–49.9)
HGB: 14.5 g/dL (ref 13.0–17.1)
LYMPH#: 2.1 10*3/uL (ref 0.9–3.3)
LYMPH%: 31.1 % (ref 14.0–48.0)
MCH: 27.3 pg — ABNORMAL LOW (ref 28.0–33.4)
MCHC: 33 g/dL (ref 32.0–35.9)
MCV: 83 fL (ref 82–98)
MONO#: 1.1 10*3/uL — ABNORMAL HIGH (ref 0.1–0.9)
MONO%: 15.5 % — ABNORMAL HIGH (ref 0.0–13.0)
NEUT#: 3.5 10*3/uL (ref 1.5–6.5)
NEUT%: 51.9 % (ref 40.0–80.0)
Platelets: 264 10*3/uL (ref 145–400)
RBC: 5.32 10*6/uL (ref 4.20–5.70)
RDW: 16.7 % — ABNORMAL HIGH (ref 11.1–15.7)
WBC: 6.8 10*3/uL (ref 4.0–10.0)

## 2016-06-17 LAB — COMPREHENSIVE METABOLIC PANEL
ALT: 29 U/L (ref 0–55)
AST: 22 U/L (ref 5–34)
Albumin: 3.5 g/dL (ref 3.5–5.0)
Alkaline Phosphatase: 63 U/L (ref 40–150)
Anion Gap: 9 mEq/L (ref 3–11)
BUN: 11.8 mg/dL (ref 7.0–26.0)
CO2: 25 mEq/L (ref 22–29)
Calcium: 10.1 mg/dL (ref 8.4–10.4)
Chloride: 100 mEq/L (ref 98–109)
Creatinine: 0.8 mg/dL (ref 0.7–1.3)
EGFR: 88 mL/min/{1.73_m2} — ABNORMAL LOW (ref >90)
Glucose: 89 mg/dl (ref 70–140)
Potassium: 3.8 mEq/L (ref 3.5–5.1)
Sodium: 134 mEq/L — ABNORMAL LOW (ref 136–145)
Total Bilirubin: 0.98 mg/dL (ref 0.20–1.20)
Total Protein: 6.9 g/dL (ref 6.4–8.3)

## 2016-06-17 LAB — IRON AND TIBC
%SAT: 11 % — ABNORMAL LOW (ref 20–55)
Iron: 39 ug/dL — ABNORMAL LOW (ref 42–163)
TIBC: 368 ug/dL (ref 202–409)
UIBC: 328 ug/dL (ref 117–376)

## 2016-06-17 LAB — FERRITIN: Ferritin: 16 ng/ml — ABNORMAL LOW (ref 22–316)

## 2016-06-17 MED ORDER — INFLUENZA VAC SPLIT QUAD 0.5 ML IM SUSY
0.5000 mL | PREFILLED_SYRINGE | Freq: Once | INTRAMUSCULAR | Status: AC
Start: 2016-06-17 — End: 2016-06-17
  Administered 2016-06-17: 0.5 mL via INTRAMUSCULAR
  Filled 2016-06-17: qty 0.5

## 2016-06-17 NOTE — Progress Notes (Signed)
Hematology and Oncology Follow Up Visit  Jesse [name] [id number] September 03, 1941 75 y.o. 06/17/2016   Principle Diagnosis:  Polycythemia vera- JAK2 negative  Current Therapy:   Phlebotomy to maintain hematocrit below 45% Aspirin 81 mg by mouth daily    Interim History:  Jesse [name] is here today for a follow-up. He is doing quite well and has no complaints at this time.  No c/o fatigue, fever, chills, n/v, cough, rash, dizziness, headaches, blurred vision, SOB, chest pain, palpitations, abdominal pain or changes in bowel or bladder habits.  No lymphadenopathy found on exam. No episodes of bleeding or bruising.  No swelling, tenderness, numbness or tingling in his extremities. No c/o joint aches or pain at this time.  He continues to have a good appetite and is staying well hydrated. His weight is stable.   Medications:    Medication List       Accurate as of 06/17/16  9:44 AM. Always use your most recent med list.          ALPRAZolam 0.5 MG tablet Commonly known as:  XANAX Take 0.25 mg by mouth at bedtime as needed. For sleep   aspirin EC 81 MG tablet Take 81 mg by mouth daily.   b complex vitamins capsule Take 1 capsule by mouth daily.   cholecalciferol 1000 units tablet Commonly known as:  VITAMIN D Take 1,000 Units by mouth daily.   fish oil-omega-3 fatty acids 1000 MG capsule Take 1 g by mouth daily.   lisinopril-hydrochlorothiazide 20-12.5 MG tablet Commonly known as:  PRINZIDE,ZESTORETIC Take 1 tablet by mouth daily.   simvastatin 20 MG tablet Commonly known as:  ZOCOR Take 20 mg by mouth at bedtime.       Allergies: No Known Allergies  Past Medical History, Surgical history, Social history, and Family History were reviewed and updated.  Review of Systems: All other 10 point review of systems is negative.   Physical Exam:  height is 5\' 6"  (1.676 m) and weight is 165 lb (74.8 kg). His oral temperature is 97.7 F (36.5 C). His blood pressure is 125/76  and his pulse is 79. His respiration is 16.   Wt Readings from Last 3 Encounters:  06/17/16 165 lb (74.8 kg)  04/08/16 162 lb (73.5 kg)  02/06/16 163 lb (73.9 kg)    Ocular: Sclerae unicteric, pupils equal, round and reactive to light Ear-nose-throat: Oropharynx clear, dentition fair Lymphatic: No cervical supraclavicular or axillary adenopathy Lungs no rales or rhonchi, good excursion bilaterally Heart regular rate and rhythm, no murmur appreciated Abd soft, nontender, positive bowel sounds, no liver or spleen tip palpated on exam, no fluid wave MSK no focal spinal tenderness, no joint edema Neuro: non-focal, well-oriented, appropriate affect Breasts: Deferred  Lab Results  Component Value Date   WBC 6.8 06/17/2016   HGB 14.5 06/17/2016   HCT 44.0 06/17/2016   MCV 83 06/17/2016   PLT 264 06/17/2016   Lab Results  Component Value Date   FERRITIN 15 (L) 04/08/2016   IRON 60 04/08/2016   TIBC 381 04/08/2016   UIBC 321 04/08/2016   IRONPCTSAT 16 (L) 04/08/2016   Lab Results  Component Value Date   RETICCTPCT 1.6 01/02/2011   RBC 5.32 06/17/2016   RETICCTABS 87.5 01/02/2011   No results found for: KPAFRELGTCHN, LAMBDASER, KAPLAMBRATIO No results found for: IGGSERUM, IGA, IGMSERUM No results found for: TOTALPROTELP, ALBUMINELP, A1GS, A2GS, BETS, BETA2SER, GAMS, MSPIKE, SPEI   Chemistry      Component Value Date/Time  NA 135 (L) 04/08/2016 0946   K 3.9 04/08/2016 0946   CL 91 (L) 10/02/2015 1056   CO2 25 04/08/2016 0946   BUN 12.5 04/08/2016 0946   CREATININE 0.9 04/08/2016 0946      Component Value Date/Time   CALCIUM 10.0 04/08/2016 0946   ALKPHOS 59 04/08/2016 0946   AST 21 04/08/2016 0946   ALT 24 04/08/2016 0946   BILITOT 1.24 (H) 04/08/2016 0946     Impression and Plan: Jesse [name] is 75 year old gentleman with polycythemia vera. He is doing well and is asymptomatic at this time. His Hct is 44.0 so he will not need a phlebotomy today.  He will continue on  his 1 baby aspirin daily.  He would like a flu shot so we will take care of that for him today.  We will plan to see him back in 2 months for repeat lab work and follow-up.  He knows to contact us with any questions or concerns. We can certainly see him sooner if need be.   Eliezer Bottom, NP 9/25/20179:44 AM

## 2016-08-19 ENCOUNTER — Ambulatory Visit (HOSPITAL_BASED_OUTPATIENT_CLINIC_OR_DEPARTMENT_OTHER): Payer: Medicare Other | Admitting: Family

## 2016-08-19 ENCOUNTER — Ambulatory Visit (HOSPITAL_BASED_OUTPATIENT_CLINIC_OR_DEPARTMENT_OTHER): Payer: Medicare Other

## 2016-08-19 ENCOUNTER — Other Ambulatory Visit (HOSPITAL_BASED_OUTPATIENT_CLINIC_OR_DEPARTMENT_OTHER): Payer: Medicare Other

## 2016-08-19 VITALS — BP 137/75 | HR 81 | Temp 97.9°F | Resp 20 | Wt 167.0 lb

## 2016-08-19 VITALS — BP 141/74 | HR 70

## 2016-08-19 DIAGNOSIS — D45 Polycythemia vera: Secondary | ICD-10-CM

## 2016-08-19 LAB — COMPREHENSIVE METABOLIC PANEL
ALT: 35 U/L (ref 0–55)
AST: 30 U/L (ref 5–34)
Albumin: 3.8 g/dL (ref 3.5–5.0)
Alkaline Phosphatase: 69 U/L (ref 40–150)
Anion Gap: 10 mEq/L (ref 3–11)
BUN: 12.1 mg/dL (ref 7.0–26.0)
CO2: 24 mEq/L (ref 22–29)
Calcium: 10.6 mg/dL — ABNORMAL HIGH (ref 8.4–10.4)
Chloride: 96 mEq/L — ABNORMAL LOW (ref 98–109)
Creatinine: 0.8 mg/dL (ref 0.7–1.3)
EGFR: 88 mL/min/{1.73_m2} — ABNORMAL LOW (ref >90)
Glucose: 104 mg/dl (ref 70–140)
Potassium: 3.9 mEq/L (ref 3.5–5.1)
Sodium: 131 mEq/L — ABNORMAL LOW (ref 136–145)
Total Bilirubin: 1.48 mg/dL — ABNORMAL HIGH (ref 0.20–1.20)
Total Protein: 7.6 g/dL (ref 6.4–8.3)

## 2016-08-19 LAB — CBC WITH DIFFERENTIAL (CANCER CENTER ONLY)
BASO#: 0 10*3/uL (ref 0.0–0.2)
BASO%: 0.3 % (ref 0.0–2.0)
EOS%: 0.6 % (ref 0.0–7.0)
Eosinophils Absolute: 0.1 10*3/uL (ref 0.0–0.5)
HCT: 48.7 % (ref 38.7–49.9)
HGB: 16.4 g/dL (ref 13.0–17.1)
LYMPH#: 2.1 10*3/uL (ref 0.9–3.3)
LYMPH%: 26.5 % (ref 14.0–48.0)
MCH: 28 pg (ref 28.0–33.4)
MCHC: 33.7 g/dL (ref 32.0–35.9)
MCV: 83 fL (ref 82–98)
MONO#: 1.3 10*3/uL — ABNORMAL HIGH (ref 0.1–0.9)
MONO%: 17 % — ABNORMAL HIGH (ref 0.0–13.0)
NEUT#: 4.4 10*3/uL (ref 1.5–6.5)
NEUT%: 55.6 % (ref 40.0–80.0)
Platelets: 259 10*3/uL (ref 145–400)
RBC: 5.86 10*6/uL — ABNORMAL HIGH (ref 4.20–5.70)
RDW: 18.4 % — ABNORMAL HIGH (ref 11.1–15.7)
WBC: 7.8 10*3/uL (ref 4.0–10.0)

## 2016-08-19 LAB — IRON AND TIBC
%SAT: 17 % — ABNORMAL LOW (ref 20–55)
Iron: 68 ug/dL (ref 42–163)
TIBC: 406 ug/dL (ref 202–409)
UIBC: 339 ug/dL (ref 117–376)

## 2016-08-19 LAB — FERRITIN: Ferritin: 20 ng/ml — ABNORMAL LOW (ref 22–316)

## 2016-08-19 NOTE — Patient Instructions (Signed)
Therapeutic Phlebotomy Therapeutic phlebotomy is the controlled removal of blood from a person's body for the purpose of treating a medical condition. The procedure is similar to donating blood. Usually, about a pint (470 mL, or 0.47L) of blood is removed. The average adult has 9-12 pints (4.3-5.7 L) of blood. Therapeutic phlebotomy may be used to treat the following medical conditions:  Hemochromatosis. This is a condition in which the blood contains too much iron.  Polycythemia vera. This is a condition in which the blood contains too many red blood cells.  Porphyria cutanea tarda. This is a disease in which an important part of hemoglobin is not made properly. It results in the buildup of abnormal amounts of porphyrins in the body.  Sickle cell disease. This is a condition in which the red blood cells form an abnormal crescent shape rather than a round shape. Tell a health care provider about:  Any allergies you have.  All medicines you are taking, including vitamins, herbs, eye drops, creams, and over-the-counter medicines.  Any problems you or family members have had with anesthetic medicines.  Any blood disorders you have.  Any surgeries you have had.  Any medical conditions you have. What are the risks? Generally, this is a safe procedure. However, problems may occur, including:  Nausea or light-headedness.  Low blood pressure.  Soreness, bleeding, swelling, or bruising at the needle insertion site.  Infection. What happens before the procedure?  Follow instructions from your health care provider about eating or drinking restrictions.  Ask your health care provider about changing or stopping your regular medicines. This is especially important if you are taking diabetes medicines or blood thinners.  Wear clothing with sleeves that can be raised above the elbow.  Plan to have someone take you home after the procedure.  You may have a blood sample taken. What happens  during the procedure?  A needle will be inserted into one of your veins.  Tubing and a collection bag will be attached to that needle.  Blood will flow through the needle and tubing into the collection bag.  You may be asked to open and close your hand slowly and continually during the entire collection.  After the specified amount of blood has been removed from your body, the collection bag and tubing will be clamped.  The needle will be removed from your vein.  Pressure will be held on the site of the needle insertion to stop the bleeding.  A bandage (dressing) will be placed over the needle insertion site. The procedure may vary among health care providers and hospitals. What happens after the procedure?  Your recovery will be assessed and monitored.  You can return to your normal activities as directed by your health care provider. This information is not intended to replace advice given to you by your health care provider. Make sure you discuss any questions you have with your health care provider. Document Released: 02/11/2011 Document Revised: 05/11/2016 Document Reviewed: 09/05/2014 Elsevier Interactive Patient Education  2017 [employer]  

## 2016-08-19 NOTE — Progress Notes (Signed)
Hematology and Oncology Follow Up Visit  Jesse [name] [id number] Sep 29, 1940 75 y.o. 08/19/2016   Principle Diagnosis:  Polycythemia vera- JAK2 negative  Current Therapy:   Phlebotomy to maintain hematocrit below 45% Aspirin 81 mg by mouth daily    Interim History:  Jesse [name] is here today for a follow-up. He is symptomatic with fatigue at this time. He states that he "knows that it is time for a phlebotomy." His Hct is 48.7, platelet count is 259. He verbalized that he is taking his 1 baby aspirin daily.  No episodes of bleeding, bruising or petechiae. No lymphadenopathy found on exam.  No fever, chills, n/v, cough, rash, dizziness, headaches, blurred vision, SOB, chest pain, palpitations, abdominal pain or changes in bowel or bladder habits.  No swelling, tenderness, numbness or tingling in his extremities. He has arthritis in his lower back that comes and goes.  He has maintained a good appetite and is making sure to stay well hydrated. His weight is stable.   Medications:    Medication List       Accurate as of 08/19/16  9:33 AM. Always use your most recent med list.          ALPRAZolam 0.5 MG tablet Commonly known as:  XANAX Take 0.25 mg by mouth at bedtime as needed. For sleep   aspirin EC 81 MG tablet Take 81 mg by mouth daily.   b complex vitamins capsule Take 1 capsule by mouth daily.   cholecalciferol 1000 units tablet Commonly known as:  VITAMIN D Take 1,000 Units by mouth daily.   fish oil-omega-3 fatty acids 1000 MG capsule Take 1 g by mouth daily.   lisinopril-hydrochlorothiazide 20-12.5 MG tablet Commonly known as:  PRINZIDE,ZESTORETIC Take 1 tablet by mouth daily.   simvastatin 20 MG tablet Commonly known as:  ZOCOR Take 20 mg by mouth at bedtime.       Allergies: No Known Allergies  Past Medical History, Surgical history, Social history, and Family History were reviewed and updated.  Review of Systems: All other 10 point review of  systems is negative.   Physical Exam:  weight is 167 lb (75.8 kg). His oral temperature is 97.9 F (36.6 C). His blood pressure is 137/75 and his pulse is 81. His respiration is 20 and oxygen saturation is 99%.   Wt Readings from Last 3 Encounters:  08/19/16 167 lb (75.8 kg)  06/17/16 165 lb (74.8 kg)  04/08/16 162 lb (73.5 kg)    Ocular: Sclerae unicteric, pupils equal, round and reactive to light Ear-nose-throat: Oropharynx clear, dentition fair Lymphatic: No cervical supraclavicular or axillary adenopathy Lungs no rales or rhonchi, good excursion bilaterally Heart regular rate and rhythm, no murmur appreciated Abd soft, nontender, positive bowel sounds, no liver or spleen tip palpated on exam, no fluid wave MSK no focal spinal tenderness, no joint edema Neuro: non-focal, well-oriented, appropriate affect Breasts: Deferred  Lab Results  Component Value Date   WBC 7.8 08/19/2016   HGB 16.4 08/19/2016   HCT 48.7 08/19/2016   MCV 83 08/19/2016   PLT 259 08/19/2016   Lab Results  Component Value Date   FERRITIN 16 (L) 06/17/2016   IRON 39 (L) 06/17/2016   TIBC 368 06/17/2016   UIBC 328 06/17/2016   IRONPCTSAT 11 (L) 06/17/2016   Lab Results  Component Value Date   RETICCTPCT 1.6 01/02/2011   RBC 5.86 (H) 08/19/2016   RETICCTABS 87.5 01/02/2011   No results found for: KPAFRELGTCHN, LAMBDASER, KAPLAMBRATIO No results  found for: IGGSERUM, IGA, IGMSERUM No results found for: Ronnald Ramp, A1GS, A2GS, Violet Baldy, MSPIKE, SPEI   Chemistry      Component Value Date/Time   NA 134 (L) 06/17/2016 0913   K 3.8 06/17/2016 0913   CL 91 (L) 10/02/2015 1056   CO2 25 06/17/2016 0913   BUN 11.8 06/17/2016 0913   CREATININE 0.8 06/17/2016 0913      Component Value Date/Time   CALCIUM 10.1 06/17/2016 0913   ALKPHOS 63 06/17/2016 0913   AST 22 06/17/2016 0913   ALT 29 06/17/2016 0913   BILITOT 0.98 06/17/2016 0913     Impression and Plan: Jesse [name] is  75 year old gentleman with polycythemia vera. He is symptomatic at this time with fatigue. His Hct is 48.7. No anemia and platelet count is within normal limits.  We will phlebotomize his today and he will continue to take his 1 baby aspirin daily.  We will plan to see him back in 2 months for repeat lab work and follow-up with phlebotomy is needed.  He will contact us with any questions or concerns. We can certainly see him sooner if need be.   Jesse Bottom, NP 11/27/20179:33 AM

## 2016-08-19 NOTE — Progress Notes (Signed)
Jesse [name] presents today for phlebotomy per MD orders. Phlebotomy procedure started at 0945 and ended at 1000. 500 grams removed. Patient observed for 30 minutes after procedure without any incident. Patient tolerated procedure well. IV needle removed intact.

## 2016-10-21 ENCOUNTER — Ambulatory Visit (HOSPITAL_BASED_OUTPATIENT_CLINIC_OR_DEPARTMENT_OTHER): Payer: Medicare Other

## 2016-10-21 ENCOUNTER — Other Ambulatory Visit (HOSPITAL_BASED_OUTPATIENT_CLINIC_OR_DEPARTMENT_OTHER): Payer: Medicare Other

## 2016-10-21 ENCOUNTER — Ambulatory Visit (HOSPITAL_BASED_OUTPATIENT_CLINIC_OR_DEPARTMENT_OTHER): Payer: Medicare Other | Admitting: Hematology & Oncology

## 2016-10-21 VITALS — BP 124/83 | HR 100 | Resp 16

## 2016-10-21 VITALS — BP 144/79 | HR 96 | Temp 97.6°F | Resp 16 | Wt 164.0 lb

## 2016-10-21 DIAGNOSIS — D45 Polycythemia vera: Secondary | ICD-10-CM

## 2016-10-21 LAB — CBC WITH DIFFERENTIAL (CANCER CENTER ONLY)
BASO#: 0 10*3/uL (ref 0.0–0.2)
BASO%: 0.3 % (ref 0.0–2.0)
EOS%: 0.8 % (ref 0.0–7.0)
Eosinophils Absolute: 0.1 10*3/uL (ref 0.0–0.5)
HCT: 49.5 % (ref 38.7–49.9)
HGB: 16.8 g/dL (ref 13.0–17.1)
LYMPH#: 1.9 10*3/uL (ref 0.9–3.3)
LYMPH%: 26.1 % (ref 14.0–48.0)
MCH: 29.7 pg (ref 28.0–33.4)
MCHC: 33.9 g/dL (ref 32.0–35.9)
MCV: 88 fL (ref 82–98)
MONO#: 0.9 10*3/uL (ref 0.1–0.9)
MONO%: 12.3 % (ref 0.0–13.0)
NEUT#: 4.4 10*3/uL (ref 1.5–6.5)
NEUT%: 60.5 % (ref 40.0–80.0)
Platelets: 260 10*3/uL (ref 145–400)
RBC: 5.66 10*6/uL (ref 4.20–5.70)
RDW: 16.3 % — ABNORMAL HIGH (ref 11.1–15.7)
WBC: 7.3 10*3/uL (ref 4.0–10.0)

## 2016-10-21 LAB — COMPREHENSIVE METABOLIC PANEL
ALT: 38 U/L (ref 0–55)
AST: 25 U/L (ref 5–34)
Albumin: 3.9 g/dL (ref 3.5–5.0)
Alkaline Phosphatase: 68 U/L (ref 40–150)
Anion Gap: 12 mEq/L — ABNORMAL HIGH (ref 3–11)
BUN: 13.6 mg/dL (ref 7.0–26.0)
CO2: 25 mEq/L (ref 22–29)
Calcium: 10.3 mg/dL (ref 8.4–10.4)
Chloride: 99 mEq/L (ref 98–109)
Creatinine: 0.8 mg/dL (ref 0.7–1.3)
EGFR: 87 mL/min/{1.73_m2} — ABNORMAL LOW (ref >90)
Glucose: 125 mg/dl (ref 70–140)
Potassium: 3.9 mEq/L (ref 3.5–5.1)
Sodium: 135 mEq/L — ABNORMAL LOW (ref 136–145)
Total Bilirubin: 1.22 mg/dL — ABNORMAL HIGH (ref 0.20–1.20)
Total Protein: 7.3 g/dL (ref 6.4–8.3)

## 2016-10-21 NOTE — Progress Notes (Signed)
Hematology and Oncology Follow Up Visit  GIANNCARLO BECHEN [id number] March 03, 1941 76 y.o. 10/21/2016   Principle Diagnosis:  Polycythemia vera- JAK2 negative  Current Therapy:    Phlebotomy to maintain hematocrit below 45%  Aspirin 81 mg by mouth daily     Interim History:  Mr. Moro is back for follow-up. He is doing pretty well.  He got through the holidays okay. I see some in the grocery store right after Christmas.   He is feeling a little more tired. He feels that he will need a phlebotomy. He is pretty accurate as to when he needs to get phlebotomized.  He's had no problems with pruritus. He's had no change in bowel or bladder habits. He's had no cough.  He got through the snowstorm 2 weeks ago. He fell but thankfully did not hurt himself.  He's had no temperatures. He's had no fever.  His appetite has been good. He has had no nausea or vomiting.   Medications:  Current Outpatient Prescriptions:  .  ALPRAZolam (XANAX) 0.5 MG tablet, Take 0.25 mg by mouth at bedtime as needed. For sleep, Disp: , Rfl:  .  aspirin EC 81 MG tablet, Take 81 mg by mouth daily., Disp: , Rfl:  .  b complex vitamins capsule, Take 1 capsule by mouth daily., Disp: , Rfl:  .  cholecalciferol (VITAMIN D) 1000 UNITS tablet, Take 1,000 Units by mouth daily.  , Disp: , Rfl:  .  fish oil-omega-3 fatty acids 1000 MG capsule, Take 1 g by mouth daily. , Disp: , Rfl:  .  lisinopril-hydrochlorothiazide (PRINZIDE,ZESTORETIC) 20-12.5 MG per tablet, Take 1 tablet by mouth daily.  , Disp: , Rfl:  .  simvastatin (ZOCOR) 20 MG tablet, Take 20 mg by mouth at bedtime.  , Disp: , Rfl:   Allergies: No Known Allergies  Past Medical History, Surgical history, Social history, and Family History were reviewed and updated.  Review of Systems: As above  Physical Exam:  weight is 164 lb (74.4 kg). His oral temperature is 97.6 F (36.4 C). His blood pressure is 144/79 (abnormal) and his pulse is 96. His respiration is 16  and oxygen saturation is 99%.   Wt Readings from Last 3 Encounters:  10/21/16 164 lb (74.4 kg)  08/19/16 167 lb (75.8 kg)  06/17/16 165 lb (74.8 kg)     Well-developed and well-nourished white gentleman in no obvious distress. Head and neck exam shows no ocular or oral lesions. There are no palpable cervical or supraclavicular lymph nodes. Lungs are clear. Cardiac exam regular rate and rhythm with no murmurs, rubs or bruits. Abdomen is soft. He has good bowel sounds. There is no fluid wave. There is no palpable liver or spleen tip. Back exam shows no tenderness over the spine, ribs or hips. Extremities shows no clubbing, cyanosis or edema. Skin exam shows no rashes, ecchymoses or petechia.  Lab Results  Component Value Date   WBC 7.3 10/21/2016   HGB 16.8 10/21/2016   HCT 49.5 10/21/2016   MCV 88 10/21/2016   PLT 260 10/21/2016     Chemistry      Component Value Date/Time   NA 131 (L) 08/19/2016 0921   K 3.9 08/19/2016 0921   CL 91 (L) 10/02/2015 1056   CO2 24 08/19/2016 0921   BUN 12.1 08/19/2016 0921   CREATININE 0.8 08/19/2016 0921      Component Value Date/Time   CALCIUM 10.6 (H) 08/19/2016 0921   ALKPHOS 69 08/19/2016 [id number]  AST 30 08/19/2016 0921   ALT 35 08/19/2016 0921   BILITOT 1.48 (H) 08/19/2016 0921         Impression and Plan: Mr. Perlick is 76 year old gentleman with polycythemia vera. We will go ahead and phlebotomize him. I think that in order to get him through the winter, we will phlebotomize him next week.  He feels much better when he does get phlebotomized.  I will plan to see him back in another 3 months.   Volanda Napoleon, MD 1/29/20181:20 PM

## 2016-10-21 NOTE — Progress Notes (Signed)
Angie Fava presents today for phlebotomy per MD orders. Phlebotomy procedure started at 1320 and ended at 1330  160 grams removed via 16 g to L AC then it stopped. 16 g to R AC at 1335, ending at 1340 yielded 400 g more.  Patient observed for 30 minutes after procedure without any incident. Patient tolerated procedure well.

## 2016-10-29 ENCOUNTER — Ambulatory Visit (HOSPITAL_BASED_OUTPATIENT_CLINIC_OR_DEPARTMENT_OTHER): Payer: Medicare Other

## 2016-10-29 VITALS — BP 110/65 | HR 96 | Temp 97.5°F | Resp 17

## 2016-10-29 DIAGNOSIS — D45 Polycythemia vera: Secondary | ICD-10-CM

## 2016-10-29 NOTE — Patient Instructions (Signed)
Therapeutic Phlebotomy Therapeutic phlebotomy is the controlled removal of blood from a person's body for the purpose of treating a medical condition. The procedure is similar to donating blood. Usually, about a pint (470 mL, or 0.47L) of blood is removed. The average adult has 9-12 pints (4.3-5.7 L) of blood. Therapeutic phlebotomy may be used to treat the following medical conditions:  Hemochromatosis. This is a condition in which the blood contains too much iron.  Polycythemia vera. This is a condition in which the blood contains too many red blood cells.  Porphyria cutanea tarda. This is a disease in which an important part of hemoglobin is not made properly. It results in the buildup of abnormal amounts of porphyrins in the body.  Sickle cell disease. This is a condition in which the red blood cells form an abnormal crescent shape rather than a round shape. Tell a health care provider about:  Any allergies you have.  All medicines you are taking, including vitamins, herbs, eye drops, creams, and over-the-counter medicines.  Any problems you or family members have had with anesthetic medicines.  Any blood disorders you have.  Any surgeries you have had.  Any medical conditions you have. What are the risks? Generally, this is a safe procedure. However, problems may occur, including:  Nausea or light-headedness.  Low blood pressure.  Soreness, bleeding, swelling, or bruising at the needle insertion site.  Infection. What happens before the procedure?  Follow instructions from your health care provider about eating or drinking restrictions.  Ask your health care provider about changing or stopping your regular medicines. This is especially important if you are taking diabetes medicines or blood thinners.  Wear clothing with sleeves that can be raised above the elbow.  Plan to have someone take you home after the procedure.  You may have a blood sample taken. What happens  during the procedure?  A needle will be inserted into one of your veins.  Tubing and a collection bag will be attached to that needle.  Blood will flow through the needle and tubing into the collection bag.  You may be asked to open and close your hand slowly and continually during the entire collection.  After the specified amount of blood has been removed from your body, the collection bag and tubing will be clamped.  The needle will be removed from your vein.  Pressure will be held on the site of the needle insertion to stop the bleeding.  A bandage (dressing) will be placed over the needle insertion site. The procedure may vary among health care providers and hospitals. What happens after the procedure?  Your recovery will be assessed and monitored.  You can return to your normal activities as directed by your health care provider. This information is not intended to replace advice given to you by your health care provider. Make sure you discuss any questions you have with your health care provider. Document Released: 02/11/2011 Document Revised: 05/11/2016 Document Reviewed: 09/05/2014 Elsevier Interactive Patient Education  2017 [employer]  

## 2016-10-29 NOTE — Progress Notes (Signed)
Jesse [name] presents today for phlebotomy per MD orders. Phlebotomy procedure started at 0904 and ended at 0914. 520 grams removed via 16 gauge needle to Right AC.  Patient observed for 30 minutes after procedure without any incident. Patient tolerated procedure well. Pt given post procedure snacks and PO fluids.

## 2016-10-30 DIAGNOSIS — E784 Other hyperlipidemia: Secondary | ICD-10-CM | POA: Diagnosis not present

## 2016-10-30 DIAGNOSIS — I10 Essential (primary) hypertension: Secondary | ICD-10-CM | POA: Diagnosis not present

## 2016-10-30 DIAGNOSIS — D45 Polycythemia vera: Secondary | ICD-10-CM | POA: Diagnosis not present

## 2017-01-20 ENCOUNTER — Other Ambulatory Visit (HOSPITAL_BASED_OUTPATIENT_CLINIC_OR_DEPARTMENT_OTHER): Payer: Medicare Other

## 2017-01-20 ENCOUNTER — Ambulatory Visit (HOSPITAL_BASED_OUTPATIENT_CLINIC_OR_DEPARTMENT_OTHER): Payer: Medicare Other

## 2017-01-20 ENCOUNTER — Ambulatory Visit (HOSPITAL_BASED_OUTPATIENT_CLINIC_OR_DEPARTMENT_OTHER): Payer: Medicare Other | Admitting: Hematology & Oncology

## 2017-01-20 VITALS — BP 127/73 | HR 88 | Temp 98.3°F | Resp 18 | Wt 171.0 lb

## 2017-01-20 DIAGNOSIS — D45 Polycythemia vera: Secondary | ICD-10-CM

## 2017-01-20 LAB — CMP (CANCER CENTER ONLY)
ALT(SGPT): 36 U/L (ref 10–47)
AST: 32 U/L (ref 11–38)
Albumin: 3.8 g/dL (ref 3.3–5.5)
Alkaline Phosphatase: 58 U/L (ref 26–84)
BUN, Bld: 12 mg/dL (ref 7–22)
CO2: 28 mEq/L (ref 18–33)
Calcium: 9.9 mg/dL (ref 8.0–10.3)
Chloride: 93 mEq/L — ABNORMAL LOW (ref 98–108)
Creat: 0.9 mg/dl (ref 0.6–1.2)
Glucose, Bld: 109 mg/dL (ref 73–118)
Potassium: 3.3 mEq/L (ref 3.3–4.7)
Sodium: 131 mEq/L (ref 128–145)
Total Bilirubin: 1.4 mg/dl (ref 0.20–1.60)
Total Protein: 6.9 g/dL (ref 6.4–8.1)

## 2017-01-20 LAB — CBC WITH DIFFERENTIAL (CANCER CENTER ONLY)
BASO#: 0 10*3/uL (ref 0.0–0.2)
BASO%: 0.3 % (ref 0.0–2.0)
EOS%: 0.5 % (ref 0.0–7.0)
Eosinophils Absolute: 0 10*3/uL (ref 0.0–0.5)
HCT: 49.3 % (ref 38.7–49.9)
HGB: 16.5 g/dL (ref 13.0–17.1)
LYMPH#: 2.3 10*3/uL (ref 0.9–3.3)
LYMPH%: 30.1 % (ref 14.0–48.0)
MCH: 29 pg (ref 28.0–33.4)
MCHC: 33.5 g/dL (ref 32.0–35.9)
MCV: 87 fL (ref 82–98)
MONO#: 1.1 10*3/uL — ABNORMAL HIGH (ref 0.1–0.9)
MONO%: 14.7 % — ABNORMAL HIGH (ref 0.0–13.0)
NEUT#: 4.1 10*3/uL (ref 1.5–6.5)
NEUT%: 54.4 % (ref 40.0–80.0)
Platelets: 245 10*3/uL (ref 145–400)
RBC: 5.69 10*6/uL (ref 4.20–5.70)
RDW: 15.4 % (ref 11.1–15.7)
WBC: 7.6 10*3/uL (ref 4.0–10.0)

## 2017-01-20 LAB — IRON AND TIBC
%SAT: 25 % (ref 20–55)
Iron: 93 ug/dL (ref 42–163)
TIBC: 376 ug/dL (ref 202–409)
UIBC: 283 ug/dL (ref 117–376)

## 2017-01-20 LAB — FERRITIN: Ferritin: 17 ng/ml — ABNORMAL LOW (ref 22–316)

## 2017-01-20 NOTE — Patient Instructions (Signed)
Therapeutic Phlebotomy Therapeutic phlebotomy is the controlled removal of blood from a person's body for the purpose of treating a medical condition. The procedure is similar to donating blood. Usually, about a pint (470 mL, or 0.47L) of blood is removed. The average adult has 9-12 pints (4.3-5.7 L) of blood. Therapeutic phlebotomy may be used to treat the following medical conditions:  Hemochromatosis. This is a condition in which the blood contains too much iron.  Polycythemia vera. This is a condition in which the blood contains too many red blood cells.  Porphyria cutanea tarda. This is a disease in which an important part of hemoglobin is not made properly. It results in the buildup of abnormal amounts of porphyrins in the body.  Sickle cell disease. This is a condition in which the red blood cells form an abnormal crescent shape rather than a round shape. Tell a health care provider about:  Any allergies you have.  All medicines you are taking, including vitamins, herbs, eye drops, creams, and over-the-counter medicines.  Any problems you or family members have had with anesthetic medicines.  Any blood disorders you have.  Any surgeries you have had.  Any medical conditions you have. What are the risks? Generally, this is a safe procedure. However, problems may occur, including:  Nausea or light-headedness.  Low blood pressure.  Soreness, bleeding, swelling, or bruising at the needle insertion site.  Infection. What happens before the procedure?  Follow instructions from your health care provider about eating or drinking restrictions.  Ask your health care provider about changing or stopping your regular medicines. This is especially important if you are taking diabetes medicines or blood thinners.  Wear clothing with sleeves that can be raised above the elbow.  Plan to have someone take you home after the procedure.  You may have a blood sample taken. What happens  during the procedure?  A needle will be inserted into one of your veins.  Tubing and a collection bag will be attached to that needle.  Blood will flow through the needle and tubing into the collection bag.  You may be asked to open and close your hand slowly and continually during the entire collection.  After the specified amount of blood has been removed from your body, the collection bag and tubing will be clamped.  The needle will be removed from your vein.  Pressure will be held on the site of the needle insertion to stop the bleeding.  A bandage (dressing) will be placed over the needle insertion site. The procedure may vary among health care providers and hospitals. What happens after the procedure?  Your recovery will be assessed and monitored.  You can return to your normal activities as directed by your health care provider. This information is not intended to replace advice given to you by your health care provider. Make sure you discuss any questions you have with your health care provider. Document Released: 02/11/2011 Document Revised: 05/11/2016 Document Reviewed: 09/05/2014 Elsevier Interactive Patient Education  2017 [employer]  

## 2017-01-20 NOTE — Progress Notes (Signed)
Hematology and Oncology Follow Up Visit  Jesse [name] [id number] [birth year] 76 y.o. 01/20/2017   Principle Diagnosis:  Polycythemia vera- JAK2 negative  Current Therapy:    Phlebotomy to maintain hematocrit below 45%  Aspirin 81 mg by mouth daily     Interim History:  Jesse [name] is back for follow-up. He is doing pretty well.  He had a nice winter. 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  He has had no problems with headache. He's had no visual issues.  He's had no cough. There's been no shortness of breath.  He is playing golf but not as much as he would like.  There has been no fever. He's had no problems with influenza.  There has been no leg swelling. He's had no rashes.   He is eating well.  His sister, who has special needs, is being cared for Jesse well.  His last iron studies done back in November showed a ferritin of 20 with iron saturation of 17%.   Overall, his performance status is ECOG 0.   Medications:  Current Outpatient Prescriptions:  .  ALPRAZolam (XANAX) 0.5 MG tablet, Take 0.25 mg by mouth at bedtime as needed. For sleep, Disp: , Rfl:  .  aspirin EC 81 MG tablet, Take 81 mg by mouth daily., Disp: , Rfl:  .  b complex vitamins capsule, Take 1 capsule by mouth daily., Disp: , Rfl:  .  cholecalciferol (VITAMIN D) 1000 UNITS tablet, Take 1,000 Units by mouth daily.  , Disp: , Rfl:  .  fish oil-omega-3 fatty acids 1000 MG capsule, Take 1 g by mouth daily. , Disp: , Rfl:  .  lisinopril-hydrochlorothiazide (PRINZIDE,ZESTORETIC) 20-12.5 MG per tablet, Take 1 tablet by mouth daily.  , Disp: , Rfl:  .  simvastatin (ZOCOR) 20 MG tablet, Take 20 mg by mouth at bedtime.  , Disp: , Rfl:   Allergies: No Known Allergies  Past Medical History, Surgical history, Social history, and Family History were reviewed and updated.  Review of Systems: As above  Physical Exam:  weight is 171 lb (77.6 kg). His oral temperature is 98.3 F (36.8 C). His  blood pressure is 127/73 and his pulse is 88. His respiration is 18 and oxygen saturation is 98%.   Wt Readings from Last 3 Encounters:  01/20/17 171 lb (77.6 kg)  10/21/16 164 lb (74.4 kg)  08/19/16 167 lb (75.8 kg)     Well-developed and well-nourished Jesse [name] in no obvious distress. Head and neck exam shows no ocular or oral lesions. There are no palpable cervical or supraclavicular lymph nodes. Lungs are clear. Cardiac exam regular rate and rhythm with no murmurs, rubs or bruits. Abdomen is soft. He has good bowel sounds. There is no fluid wave. There is no palpable liver or spleen tip. Back exam shows no tenderness over the spine, ribs or hips. Extremities shows no clubbing, cyanosis or edema. Skin exam shows no rashes, ecchymoses or petechia.  Lab Results  Component Value Date   WBC 7.6 01/20/2017   HGB 16.5 01/20/2017   HCT 49.3 01/20/2017   MCV 87 01/20/2017   PLT 245 01/20/2017     Chemistry      Component Value Date/Time   NA 131 01/20/2017 1012   NA 135 (L) 10/21/2016 1151   K 3.3 01/20/2017 1012   K 3.9 10/21/2016 1151   CL 93 (L) 01/20/2017 1012   CO2 28 01/20/2017 1012   CO2 25 10/21/2016 1151  BUN 12 01/20/2017 1012   BUN 13.6 10/21/2016 1151   CREATININE 0.9 01/20/2017 1012   CREATININE 0.8 10/21/2016 1151      Component Value Date/Time   CALCIUM 9.9 01/20/2017 1012   CALCIUM 10.3 10/21/2016 1151   ALKPHOS 58 01/20/2017 1012   ALKPHOS 68 10/21/2016 1151   AST 32 01/20/2017 1012   AST 25 10/21/2016 1151   ALT 36 01/20/2017 1012   ALT 38 10/21/2016 1151   BILITOT 1.40 01/20/2017 1012   BILITOT 1.22 (H) 10/21/2016 1151         Impression and Plan: Jesse [name] is 76 year old [name] with polycythemia vera. We will go ahead and phlebotomize him.   I will plan to see him back in another 3 months.   Jesse Napoleon, MD 4/30/201810:54 AM

## 2017-01-20 NOTE — Progress Notes (Signed)
Jesse [name] presents today for phlebotomy per MD orders. Phlebotomy procedure started at 1110 and ended at 1118 and 530 grams removed via 16G to L AC.  Patient observed for 30 minutes after procedure without any incident. Diet and nutrition offered. Patient tolerated procedure well.

## 2017-03-17 ENCOUNTER — Ambulatory Visit (INDEPENDENT_AMBULATORY_CARE_PROVIDER_SITE_OTHER): Payer: Medicare Other | Admitting: Ophthalmology

## 2017-03-17 DIAGNOSIS — H338 Other retinal detachments: Secondary | ICD-10-CM | POA: Diagnosis not present

## 2017-03-17 DIAGNOSIS — H35033 Hypertensive retinopathy, bilateral: Secondary | ICD-10-CM | POA: Diagnosis not present

## 2017-03-17 DIAGNOSIS — I10 Essential (primary) hypertension: Secondary | ICD-10-CM | POA: Diagnosis not present

## 2017-03-17 DIAGNOSIS — H43813 Vitreous degeneration, bilateral: Secondary | ICD-10-CM

## 2017-03-17 DIAGNOSIS — H33301 Unspecified retinal break, right eye: Secondary | ICD-10-CM | POA: Diagnosis not present

## 2017-04-21 ENCOUNTER — Other Ambulatory Visit (HOSPITAL_BASED_OUTPATIENT_CLINIC_OR_DEPARTMENT_OTHER): Payer: Medicare Other

## 2017-04-21 ENCOUNTER — Ambulatory Visit (HOSPITAL_BASED_OUTPATIENT_CLINIC_OR_DEPARTMENT_OTHER): Payer: Medicare Other | Admitting: Hematology & Oncology

## 2017-04-21 ENCOUNTER — Ambulatory Visit (HOSPITAL_BASED_OUTPATIENT_CLINIC_OR_DEPARTMENT_OTHER): Payer: Medicare Other

## 2017-04-21 VITALS — BP 103/78 | HR 86 | Resp 17

## 2017-04-21 VITALS — BP 133/76 | HR 85 | Temp 98.6°F | Resp 16 | Wt 165.0 lb

## 2017-04-21 DIAGNOSIS — D45 Polycythemia vera: Secondary | ICD-10-CM | POA: Diagnosis not present

## 2017-04-21 LAB — IRON AND TIBC
%SAT: 24 % (ref 20–55)
Iron: 89 ug/dL (ref 42–163)
TIBC: 374 ug/dL (ref 202–409)
UIBC: 285 ug/dL (ref 117–376)

## 2017-04-21 LAB — CBC WITH DIFFERENTIAL (CANCER CENTER ONLY)
BASO#: 0 10*3/uL (ref 0.0–0.2)
BASO%: 0.3 % (ref 0.0–2.0)
EOS%: 0.7 % (ref 0.0–7.0)
Eosinophils Absolute: 0.1 10*3/uL (ref 0.0–0.5)
HCT: 50.5 % — ABNORMAL HIGH (ref 38.7–49.9)
HGB: 17.4 g/dL — ABNORMAL HIGH (ref 13.0–17.1)
LYMPH#: 2.1 10*3/uL (ref 0.9–3.3)
LYMPH%: 29.7 % (ref 14.0–48.0)
MCH: 30.3 pg (ref 28.0–33.4)
MCHC: 34.5 g/dL (ref 32.0–35.9)
MCV: 88 fL (ref 82–98)
MONO#: 1 10*3/uL — ABNORMAL HIGH (ref 0.1–0.9)
MONO%: 14.2 % — ABNORMAL HIGH (ref 0.0–13.0)
NEUT#: 4 10*3/uL (ref 1.5–6.5)
NEUT%: 55.1 % (ref 40.0–80.0)
Platelets: 226 10*3/uL (ref 145–400)
RBC: 5.75 10*6/uL — ABNORMAL HIGH (ref 4.20–5.70)
RDW: 16.1 % — ABNORMAL HIGH (ref 11.1–15.7)
WBC: 7.2 10*3/uL (ref 4.0–10.0)

## 2017-04-21 LAB — FERRITIN: Ferritin: 24 ng/ml (ref 22–316)

## 2017-04-21 NOTE — Progress Notes (Signed)
Jesse [name] presents today for phlebotomy per MD orders. Phlebotomy procedure started at 1130 and ended at 1137 with  520 grams remove via 16G LAC. Diet and nutrition offered. Patient observed for 30 minutes after procedure without any incident. Patient tolerated procedure well.

## 2017-04-21 NOTE — Progress Notes (Signed)
Hematology and Oncology Follow Up Visit  Jesse [name] [id number] [birth year] 76 y.o. 04/21/2017   Principle Diagnosis:  Polycythemia vera- JAK2 negative  Current Therapy:    Phlebotomy to maintain hematocrit below 45%  Aspirin 81 mg by mouth daily     Interim History:  Jesse [name] is back for follow-up. He is doing pretty well.  He had a nice summer so far. He really has not done all that much. 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  He has had no problems with headache. He's had no visual issues.  He's had no cough. There's been no shortness of breath.  He is playing golf but not as much as he would like.  There has been no fever. He's had no problems with influenza.  There has been no leg swelling. He's had no rashes.   He is eating well.  His sister, who has special needs, is being cared for quite yes well.  His last iron studies done back in April showed a ferritin of 17 with iron saturation of 25%.   Overall, his performance status is ECOG 0.   Medications:  Current Outpatient Prescriptions:  .  ALPRAZolam (XANAX) 0.5 MG tablet, Take 0.25 mg by mouth at bedtime as needed. For sleep, Disp: , Rfl:  .  aspirin EC 81 MG tablet, Take 81 mg by mouth daily., Disp: , Rfl:  .  b complex vitamins capsule, Take 1 capsule by mouth daily., Disp: , Rfl:  .  cholecalciferol (VITAMIN D) 1000 UNITS tablet, Take 1,000 Units by mouth daily.  , Disp: , Rfl:  .  fish oil-omega-3 fatty acids 1000 MG capsule, Take 1 g by mouth daily. , Disp: , Rfl:  .  lisinopril-hydrochlorothiazide (PRINZIDE,ZESTORETIC) 20-12.5 MG per tablet, Take 1 tablet by mouth daily.  , Disp: , Rfl:  .  simvastatin (ZOCOR) 20 MG tablet, Take 20 mg by mouth at bedtime.  , Disp: , Rfl:   Allergies: No Known Allergies  Past Medical History, Surgical history, Social history, and Family History were reviewed and updated.  Review of Systems: As above  Physical Exam:  weight is 165 lb (74.8 kg).  His oral temperature is 98.6 F (37 C). His blood pressure is 133/76 and his pulse is 85. His respiration is 16 and oxygen saturation is 96%.   Wt Readings from Last 3 Encounters:  04/21/17 165 lb (74.8 kg)  01/20/17 171 lb (77.6 kg)  10/21/16 164 lb (74.4 kg)     Well-developed and well-nourished white gentleman in no obvious distress. Head and neck exam shows no ocular or oral lesions. There are no palpable cervical or supraclavicular lymph nodes. Lungs are clear. Cardiac exam regular rate and rhythm with no murmurs, rubs or bruits. Abdomen is soft. He has good bowel sounds. There is no fluid wave. There is no palpable liver or spleen tip. Back exam shows no tenderness over the spine, ribs or hips. Extremities shows no clubbing, cyanosis or edema. Skin exam shows no rashes, ecchymoses or petechia.  Lab Results  Component Value Date   WBC 7.2 04/21/2017   HGB 17.4 (H) 04/21/2017   HCT 50.5 (H) 04/21/2017   MCV 88 04/21/2017   PLT 226 04/21/2017     Chemistry      Component Value Date/Time   NA 131 01/20/2017 1012   NA 135 (L) 10/21/2016 1151   K 3.3 01/20/2017 1012   K 3.9 10/21/2016 1151   CL 93 (L)  01/20/2017 1012   CO2 28 01/20/2017 1012   CO2 25 10/21/2016 1151   BUN 12 01/20/2017 1012   BUN 13.6 10/21/2016 1151   CREATININE 0.9 01/20/2017 1012   CREATININE 0.8 10/21/2016 1151      Component Value Date/Time   CALCIUM 9.9 01/20/2017 1012   CALCIUM 10.3 10/21/2016 1151   ALKPHOS 58 01/20/2017 1012   ALKPHOS 68 10/21/2016 1151   AST 32 01/20/2017 1012   AST 25 10/21/2016 1151   ALT 36 01/20/2017 1012   ALT 38 10/21/2016 1151   BILITOT 1.40 01/20/2017 1012   BILITOT 1.22 (H) 10/21/2016 1151         Impression and Plan: Jesse [name] is 76 year old gentleman with polycythemia vera. We will go ahead and phlebotomize him.   I will plan to see him back in another 3 months.   Volanda Napoleon, MD 7/30/201811:27 AM

## 2017-04-21 NOTE — Patient Instructions (Signed)
Therapeutic Phlebotomy, Care After  Refer to this sheet in the next few weeks. These instructions provide you with information about caring for yourself after your procedure. Your health care provider may also give you more specific instructions. Your treatment has been planned according to current medical practices, but problems sometimes occur. Call your health care provider if you have any problems or questions after your procedure.  WHAT TO EXPECT AFTER THE PROCEDURE  After your procedure, it is common to have:   Light-headedness or dizziness. You may feel faint.   Nausea.   Tiredness.  HOME CARE INSTRUCTIONS  Activities   Return to your normal activities as directed by your health care provider. Most people can go back to their normal activities right away.   Avoid strenuous physical activity and heavy lifting or pulling for about 5 hours after the procedure. Do not lift anything that is heavier than 10 lb (4.5 kg).   Athletes should avoid strenuous exercise for at least 12 hours.   Change positions slowly for the remainder of the day. This will help to prevent light-headedness or fainting.   If you feel light-headed, lie down until the feeling goes away.  Eating and Drinking   Be sure to eat well-balanced meals for the next 24 hours.   Drink enough fluid to keep your urine clear or pale yellow.   Avoid drinking alcohol on the day that you had the procedure.  Care of the Needle Insertion Site   Keep your bandage dry. You can remove the bandage after about 5 hours or as directed by your health care provider.   If you have bleeding from the needle insertion site, elevate your arm and press firmly on the site until the bleeding stops.   If you have bruising at the site, apply ice to the area:   Put ice in a plastic bag.   Place a towel between your skin and the bag.   Leave the ice on for 20 minutes, 2-3 times a day for the first 24 hours.   If the swelling does not go away after 24 hours, apply  a warm, moist washcloth to the area for 20 minutes, 2-3 times a day.  General Instructions   Avoid smoking for at least 30 minutes after the procedure.   Keep all follow-up visits as directed by your health care provider. It is important to continue with further therapeutic phlebotomy treatments as directed.  SEEK MEDICAL CARE IF:   You have redness, swelling, or pain at the needle insertion site.   You have fluid, blood, or pus coming from the needle insertion site.   You feel light-headed, dizzy, or nauseated, and the feeling does not go away.   You notice new bruising at the needle insertion site.   You feel weaker than normal.   You have a fever or chills.  SEEK IMMEDIATE MEDICAL CARE IF:   You have severe nausea or vomiting.   You have chest pain.   You have trouble breathing.    This information is not intended to replace advice given to you by your health care provider. Make sure you discuss any questions you have with your health care provider.    Document Released: 02/11/2011 Document Revised: 01/24/2015 Document Reviewed: 09/05/2014  [employer]vier Interactive Patient Education 2016 [employer]

## 2017-04-28 DIAGNOSIS — I10 Essential (primary) hypertension: Secondary | ICD-10-CM | POA: Diagnosis not present

## 2017-04-28 DIAGNOSIS — E784 Other hyperlipidemia: Secondary | ICD-10-CM | POA: Diagnosis not present

## 2017-04-28 DIAGNOSIS — D45 Polycythemia vera: Secondary | ICD-10-CM | POA: Diagnosis not present

## 2017-07-22 ENCOUNTER — Ambulatory Visit (HOSPITAL_BASED_OUTPATIENT_CLINIC_OR_DEPARTMENT_OTHER): Payer: Medicare Other | Admitting: Family

## 2017-07-22 ENCOUNTER — Ambulatory Visit (HOSPITAL_BASED_OUTPATIENT_CLINIC_OR_DEPARTMENT_OTHER): Payer: Medicare Other

## 2017-07-22 ENCOUNTER — Other Ambulatory Visit (HOSPITAL_BASED_OUTPATIENT_CLINIC_OR_DEPARTMENT_OTHER): Payer: Medicare Other

## 2017-07-22 VITALS — BP 139/79 | HR 87 | Temp 98.1°F | Resp 16 | Wt 160.0 lb

## 2017-07-22 DIAGNOSIS — Z23 Encounter for immunization: Secondary | ICD-10-CM

## 2017-07-22 DIAGNOSIS — R5383 Other fatigue: Secondary | ICD-10-CM | POA: Diagnosis not present

## 2017-07-22 DIAGNOSIS — M2550 Pain in unspecified joint: Secondary | ICD-10-CM

## 2017-07-22 DIAGNOSIS — D45 Polycythemia vera: Secondary | ICD-10-CM | POA: Diagnosis not present

## 2017-07-22 LAB — CBC WITH DIFFERENTIAL (CANCER CENTER ONLY)
BASO#: 0 10*3/uL (ref 0.0–0.2)
BASO%: 0.2 % (ref 0.0–2.0)
EOS%: 0.6 % (ref 0.0–7.0)
Eosinophils Absolute: 0 10*3/uL (ref 0.0–0.5)
HCT: 50.5 % — ABNORMAL HIGH (ref 38.7–49.9)
HGB: 17.2 g/dL — ABNORMAL HIGH (ref 13.0–17.1)
LYMPH#: 1.9 10*3/uL (ref 0.9–3.3)
LYMPH%: 29 % (ref 14.0–48.0)
MCH: 30.5 pg (ref 28.0–33.4)
MCHC: 34.1 g/dL (ref 32.0–35.9)
MCV: 90 fL (ref 82–98)
MONO#: 0.8 10*3/uL (ref 0.1–0.9)
MONO%: 13 % (ref 0.0–13.0)
NEUT#: 3.7 10*3/uL (ref 1.5–6.5)
NEUT%: 57.2 % (ref 40.0–80.0)
Platelets: 250 10*3/uL (ref 145–400)
RBC: 5.64 10*6/uL (ref 4.20–5.70)
RDW: 15.3 % (ref 11.1–15.7)
WBC: 6.4 10*3/uL (ref 4.0–10.0)

## 2017-07-22 LAB — CMP (CANCER CENTER ONLY)
ALT(SGPT): 32 U/L (ref 10–47)
AST: 29 U/L (ref 11–38)
Albumin: 3.7 g/dL (ref 3.3–5.5)
Alkaline Phosphatase: 49 U/L (ref 26–84)
BUN, Bld: 11 mg/dL (ref 7–22)
CO2: 28 mEq/L (ref 18–33)
Calcium: 9.8 mg/dL (ref 8.0–10.3)
Chloride: 96 mEq/L — ABNORMAL LOW (ref 98–108)
Creat: 1.3 mg/dl — ABNORMAL HIGH (ref 0.6–1.2)
Glucose, Bld: 131 mg/dL — ABNORMAL HIGH (ref 73–118)
Potassium: 3.7 mEq/L (ref 3.3–4.7)
Sodium: 133 mEq/L (ref 128–145)
Total Bilirubin: 2 mg/dl — ABNORMAL HIGH (ref 0.20–1.60)
Total Protein: 7 g/dL (ref 6.4–8.1)

## 2017-07-22 LAB — FERRITIN: Ferritin: 27 ng/ml (ref 22–316)

## 2017-07-22 LAB — IRON AND TIBC
%SAT: 51 % (ref 20–55)
Iron: 189 ug/dL — ABNORMAL HIGH (ref 42–163)
TIBC: 369 ug/dL (ref 202–409)
UIBC: 180 ug/dL (ref 117–376)

## 2017-07-22 MED ORDER — INFLUENZA VAC SPLIT QUAD 0.5 ML IM SUSY
0.5000 mL | PREFILLED_SYRINGE | Freq: Once | INTRAMUSCULAR | Status: AC
  Administered 2017-07-22: 0.5 mL via INTRAMUSCULAR

## 2017-07-22 MED ORDER — INFLUENZA VAC SPLIT QUAD 0.5 ML IM SUSY
PREFILLED_SYRINGE | INTRAMUSCULAR | Status: AC
  Filled 2017-07-22: qty 0.5

## 2017-07-22 NOTE — Progress Notes (Signed)
1 unit Phlebotomy performed over 10 minutes using a 18 gauge IV set in the right forearm. Patient tolerated well. Nourishment provided.
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
## 2017-08-19 ENCOUNTER — Other Ambulatory Visit: Payer: Self-pay

## 2017-08-19 ENCOUNTER — Other Ambulatory Visit (HOSPITAL_BASED_OUTPATIENT_CLINIC_OR_DEPARTMENT_OTHER): Payer: Medicare Other

## 2017-08-19 ENCOUNTER — Encounter: Payer: Self-pay | Admitting: Family

## 2017-08-19 ENCOUNTER — Ambulatory Visit (HOSPITAL_BASED_OUTPATIENT_CLINIC_OR_DEPARTMENT_OTHER): Payer: Medicare Other | Admitting: Family

## 2017-08-19 ENCOUNTER — Ambulatory Visit (HOSPITAL_BASED_OUTPATIENT_CLINIC_OR_DEPARTMENT_OTHER): Payer: Medicare Other

## 2017-08-19 VITALS — BP 132/77 | HR 90 | Temp 98.2°F | Resp 18 | Wt 158.0 lb

## 2017-08-19 VITALS — BP 110/79 | HR 69

## 2017-08-19 DIAGNOSIS — D45 Polycythemia vera: Secondary | ICD-10-CM | POA: Diagnosis not present

## 2017-08-19 DIAGNOSIS — D500 Iron deficiency anemia secondary to blood loss (chronic): Secondary | ICD-10-CM

## 2017-08-19 LAB — CBC WITH DIFFERENTIAL (CANCER CENTER ONLY)
BASO#: 0 10*3/uL (ref 0.0–0.2)
BASO%: 0.2 % (ref 0.0–2.0)
EOS%: 0.5 % (ref 0.0–7.0)
Eosinophils Absolute: 0 10*3/uL (ref 0.0–0.5)
HCT: 46.9 % (ref 38.7–49.9)
HGB: 16.1 g/dL (ref 13.0–17.1)
LYMPH#: 2.2 10*3/uL (ref 0.9–3.3)
LYMPH%: 24.8 % (ref 14.0–48.0)
MCH: 30.7 pg (ref 28.0–33.4)
MCHC: 34.3 g/dL (ref 32.0–35.9)
MCV: 89 fL (ref 82–98)
MONO#: 1.3 10*3/uL — ABNORMAL HIGH (ref 0.1–0.9)
MONO%: 14.9 % — ABNORMAL HIGH (ref 0.0–13.0)
NEUT#: 5.2 10*3/uL (ref 1.5–6.5)
NEUT%: 59.6 % (ref 40.0–80.0)
Platelets: 251 10*3/uL (ref 145–400)
RBC: 5.25 10*6/uL (ref 4.20–5.70)
RDW: 14.9 % (ref 11.1–15.7)
WBC: 8.7 10*3/uL (ref 4.0–10.0)

## 2017-08-19 LAB — CMP (CANCER CENTER ONLY)
ALT(SGPT): 32 U/L (ref 10–47)
AST: 25 U/L (ref 11–38)
Albumin: 3.6 g/dL (ref 3.3–5.5)
Alkaline Phosphatase: 68 U/L (ref 26–84)
BUN, Bld: 14 mg/dL (ref 7–22)
CO2: 28 mEq/L (ref 18–33)
Calcium: 10.1 mg/dL (ref 8.0–10.3)
Chloride: 95 mEq/L — ABNORMAL LOW (ref 98–108)
Creat: 1 mg/dl (ref 0.6–1.2)
Glucose, Bld: 114 mg/dL (ref 73–118)
Potassium: 3.8 mEq/L (ref 3.3–4.7)
Sodium: 136 mEq/L (ref 128–145)
Total Bilirubin: 1.5 mg/dl (ref 0.20–1.60)
Total Protein: 6.9 g/dL (ref 6.4–8.1)

## 2017-08-19 NOTE — Patient Instructions (Signed)
Therapeutic Phlebotomy Therapeutic phlebotomy is the controlled removal of blood from a person's body for the purpose of treating a medical condition. The procedure is similar to donating blood. Usually, about a pint (470 mL, or 0.47L) of blood is removed. The average adult has 9-12 pints (4.3-5.7 L) of blood. Therapeutic phlebotomy may be used to treat the following medical conditions:  Hemochromatosis. This is a condition in which the blood contains too much iron.  Polycythemia vera. This is a condition in which the blood contains too many red blood cells.  Porphyria cutanea tarda. This is a disease in which an important part of hemoglobin is not made properly. It results in the buildup of abnormal amounts of porphyrins in the body.  Sickle cell disease. This is a condition in which the red blood cells form an abnormal crescent shape rather than a round shape.  Tell a health care provider about:  Any allergies you have.  All medicines you are taking, including vitamins, herbs, eye drops, creams, and over-the-counter medicines.  Any problems you or family members have had with anesthetic medicines.  Any blood disorders you have.  Any surgeries you have had.  Any medical conditions you have. What are the risks? Generally, this is a safe procedure. However, problems may occur, including:  Nausea or light-headedness.  Low blood pressure.  Soreness, bleeding, swelling, or bruising at the needle insertion site.  Infection.  What happens before the procedure?  Follow instructions from your health care provider about eating or drinking restrictions.  Ask your health care provider about changing or stopping your regular medicines. This is especially important if you are taking diabetes medicines or blood thinners.  Wear clothing with sleeves that can be raised above the elbow.  Plan to have someone take you home after the procedure.  You may have a blood sample taken. What  happens during the procedure?  A needle will be inserted into one of your veins.  Tubing and a collection bag will be attached to that needle.  Blood will flow through the needle and tubing into the collection bag.  You may be asked to open and close your hand slowly and continually during the entire collection.  After the specified amount of blood has been removed from your body, the collection bag and tubing will be clamped.  The needle will be removed from your vein.  Pressure will be held on the site of the needle insertion to stop the bleeding.  A bandage (dressing) will be placed over the needle insertion site. The procedure may vary among health care providers and hospitals. What happens after the procedure?  Your recovery will be assessed and monitored.  You can return to your normal activities as directed by your health care provider. This information is not intended to replace advice given to you by your health care provider. Make sure you discuss any questions you have with your health care provider. Document Released: 02/11/2011 Document Revised: 05/11/2016 Document Reviewed: 09/05/2014 Elsevier Interactive Patient Education  2018 [employer]  

## 2017-08-19 NOTE — Progress Notes (Signed)
Therapeutic phlebotomy performed over 8 minutes using a 16 gauge phlebotomy set in the left AC. Patient tolerated well. Nourishment provided.   Patient refuses to stay for the entire 30 minute post phlebotomy observation. VSS. Patient discharged ambulatory.

## 2017-08-19 NOTE — Progress Notes (Signed)
Hematology and Oncology Follow Up Visit  Jesse [name] [id number] 09-Jul-1941 76 y.o. 08/19/2017   Principle Diagnosis:  Polycythemia vera - JAK2 negative  Current Therapy:   Phlebotomy to maintain hematocrit below 45% Aspirin 81 mg by mouth daily   Interim History:  Jesse [name] is here today for follow-up. He is feeling a little fatigued and his complexion is ruddy. Hct is 46.9%. We will phlebotomize him while he is here today.  He denies fever, chills, n/v, cough, rash, dizziness, SOB, chest pain, palpitations, abdominal pain or changes in bowel or bladder habits.  No episodes of bleeding, bruising or petechiae. No lymphadenopathy found on exam.  No swelling, numbness or tingling in her extremities. No c/o pain at this time. He has occasional joint aches and pain in his hands from arthritis.   He has maintained a good appetite and is staying well hydrated. His weight is stable.   ECOG Performance Status: 1 - Symptomatic but completely ambulatory  Medications:  Allergies as of 08/19/2017   No Known Allergies     Medication List        Accurate as of 08/19/17 12:27 PM. Always use your most recent med list.          ALPRAZolam 0.5 MG tablet Commonly known as:  XANAX Take 0.25 mg by mouth at bedtime as needed. For sleep   aspirin EC 81 MG tablet Take 81 mg by mouth daily.   b complex vitamins capsule Take 1 capsule by mouth daily.   cholecalciferol 1000 units tablet Commonly known as:  VITAMIN D Take 1,000 Units by mouth daily.   fish oil-omega-3 fatty acids 1000 MG capsule Take 1 g by mouth daily.   lisinopril-hydrochlorothiazide 20-12.5 MG tablet Commonly known as:  PRINZIDE,ZESTORETIC Take 1 tablet by mouth daily.   simvastatin 20 MG tablet Commonly known as:  ZOCOR Take 20 mg by mouth at bedtime.       Allergies: No Known Allergies  Past Medical History, Surgical history, Social history, and Family History were reviewed and updated.  Review of  Systems: All other 10 point review of systems is negative.   Physical Exam:  weight is 158 lb (71.7 kg). His oral temperature is 98.2 F (36.8 C). His blood pressure is 132/77 and his pulse is 90. His respiration is 18 and oxygen saturation is 99%.   Wt Readings from Last 3 Encounters:  08/19/17 158 lb (71.7 kg)  07/22/17 160 lb (72.6 kg)  04/21/17 165 lb (74.8 kg)    Ocular: Sclerae unicteric, pupils equal, round and reactive to light Ear-nose-throat: Oropharynx clear, dentition fair Lymphatic: No cervical, supraclavicular or axillary adenopathy Lungs no rales or rhonchi, good excursion bilaterally Heart regular rate and rhythm, no murmur appreciated Abd soft, nontender, positive bowel sounds, no liver or spleen tip palpated on exam, no fluid wave  MSK no focal spinal tenderness, no joint edema Neuro: non-focal, well-oriented, appropriate affect Breasts: Deferred   Lab Results  Component Value Date   WBC 8.7 08/19/2017   HGB 16.1 08/19/2017   HCT 46.9 08/19/2017   MCV 89 08/19/2017   PLT 251 08/19/2017   Lab Results  Component Value Date   FERRITIN 27 07/22/2017   IRON 189 (H) 07/22/2017   TIBC 369 07/22/2017   UIBC 180 07/22/2017   IRONPCTSAT 51 07/22/2017   Lab Results  Component Value Date   RETICCTPCT 1.6 01/02/2011   RBC 5.25 08/19/2017   RETICCTABS 87.5 01/02/2011   No results found for:  KPAFRELGTCHN, LAMBDASER, KAPLAMBRATIO No results found for: IGGSERUM, IGA, IGMSERUM No results found for: Odetta Pink, SPEI   Chemistry      Component Value Date/Time   NA 136 08/19/2017 1149   NA 135 (L) 10/21/2016 1151   K 3.8 08/19/2017 1149   K 3.9 10/21/2016 1151   CL 95 (L) 08/19/2017 1149   CO2 28 08/19/2017 1149   CO2 25 10/21/2016 1151   BUN 14 08/19/2017 1149   BUN 13.6 10/21/2016 1151   CREATININE 1.0 08/19/2017 1149   CREATININE 0.8 10/21/2016 1151      Component Value Date/Time   CALCIUM 10.1  08/19/2017 1149   CALCIUM 10.3 10/21/2016 1151   ALKPHOS 68 08/19/2017 1149   ALKPHOS 68 10/21/2016 1151   AST 25 08/19/2017 1149   AST 25 10/21/2016 1151   ALT 32 08/19/2017 1149   ALT 38 10/21/2016 1151   BILITOT 1.50 08/19/2017 1149   BILITOT 1.22 (H) 10/21/2016 1151      Impression and Plan: Jesse [name] is a very pleasant 76 yo caucasian gentleman with polycythemia, JAK2 negative. He is symptomatic with fatigue and a ruddy complexion.  Hct is 46.9% so we will proceed with phlebotomy today.  He will continue to take his baby aspirin daily.  We will plan to see him back in another 2 months for follow-up.  He promises to contact our office with any questions or concerns. We can certainly see him sooner if need be.   Eliezer Bottom, NP 11/27/201812:27 PM

## 2017-08-29 DIAGNOSIS — R7303 Prediabetes: Secondary | ICD-10-CM | POA: Diagnosis not present

## 2017-08-29 DIAGNOSIS — E782 Mixed hyperlipidemia: Secondary | ICD-10-CM | POA: Diagnosis not present

## 2017-08-29 DIAGNOSIS — I10 Essential (primary) hypertension: Secondary | ICD-10-CM | POA: Diagnosis not present

## 2017-08-29 DIAGNOSIS — E559 Vitamin D deficiency, unspecified: Secondary | ICD-10-CM | POA: Diagnosis not present

## 2017-08-29 DIAGNOSIS — D45 Polycythemia vera: Secondary | ICD-10-CM | POA: Diagnosis not present

## 2017-08-29 DIAGNOSIS — Z1389 Encounter for screening for other disorder: Secondary | ICD-10-CM | POA: Diagnosis not present

## 2017-10-20 ENCOUNTER — Encounter: Payer: Self-pay | Admitting: Family

## 2017-10-20 ENCOUNTER — Inpatient Hospital Stay: Payer: Medicare Other

## 2017-10-20 ENCOUNTER — Inpatient Hospital Stay: Payer: Medicare Other | Attending: Family | Admitting: Family

## 2017-10-20 ENCOUNTER — Other Ambulatory Visit: Payer: Self-pay

## 2017-10-20 VITALS — BP 129/83 | HR 92 | Temp 98.0°F | Resp 18 | Wt 161.0 lb

## 2017-10-20 VITALS — BP 105/66 | HR 95 | Resp 18

## 2017-10-20 DIAGNOSIS — D45 Polycythemia vera: Secondary | ICD-10-CM | POA: Diagnosis not present

## 2017-10-20 DIAGNOSIS — D500 Iron deficiency anemia secondary to blood loss (chronic): Secondary | ICD-10-CM

## 2017-10-20 LAB — CBC WITH DIFFERENTIAL (CANCER CENTER ONLY)
Basophils Absolute: 0 10*3/uL (ref 0.0–0.1)
Basophils Relative: 0 %
Eosinophils Absolute: 0 10*3/uL (ref 0.0–0.5)
Eosinophils Relative: 1 %
HCT: 48.2 % (ref 38.7–49.9)
Hemoglobin: 16.1 g/dL (ref 13.0–17.1)
Lymphocytes Relative: 27 %
Lymphs Abs: 1.9 10*3/uL (ref 0.9–3.3)
MCH: 29.8 pg (ref 28.0–33.4)
MCHC: 33.4 g/dL (ref 32.0–35.9)
MCV: 89.3 fL (ref 82.0–98.0)
Monocytes Absolute: 1.1 10*3/uL — ABNORMAL HIGH (ref 0.1–0.9)
Monocytes Relative: 16 %
Neutro Abs: 4.1 10*3/uL (ref 1.5–6.5)
Neutrophils Relative %: 56 %
Platelet Count: 276 10*3/uL (ref 140–400)
RBC: 5.4 MIL/uL (ref 4.20–5.70)
RDW: 14.2 % (ref 11.1–15.7)
WBC Count: 7.2 10*3/uL (ref 4.0–10.3)

## 2017-10-20 LAB — CMP (CANCER CENTER ONLY)
ALT: 27 U/L (ref 0–55)
AST: 25 U/L (ref 5–34)
Albumin: 3.8 g/dL (ref 3.5–5.0)
Alkaline Phosphatase: 64 U/L (ref 40–150)
Anion gap: 9 (ref 3–11)
BUN: 14 mg/dL (ref 7–26)
CO2: 28 mmol/L (ref 22–29)
Calcium: 10.3 mg/dL (ref 8.4–10.4)
Chloride: 97 mmol/L — ABNORMAL LOW (ref 98–109)
Creatinine: 0.91 mg/dL (ref 0.70–1.30)
GFR, Est AFR Am: >60 mL/min (ref >60)
GFR, Estimated: >60 mL/min (ref >60)
Glucose, Bld: 96 mg/dL (ref 70–140)
Potassium: 3.8 mmol/L (ref 3.5–5.1)
Sodium: 134 mmol/L — ABNORMAL LOW (ref 136–145)
Total Bilirubin: 1.4 mg/dL — ABNORMAL HIGH (ref 0.2–1.2)
Total Protein: 7.3 g/dL (ref 6.4–8.3)

## 2017-10-20 LAB — IRON AND TIBC
Iron: 94 ug/dL (ref 42–163)
Saturation Ratios: 25 % — ABNORMAL LOW (ref 42–163)
TIBC: 376 ug/dL (ref 202–409)
UIBC: 283 ug/dL

## 2017-10-20 LAB — FERRITIN: Ferritin: 21 ng/mL — ABNORMAL LOW (ref 22–316)

## 2017-10-20 NOTE — Patient Instructions (Signed)

## 2017-10-20 NOTE — Progress Notes (Signed)
Hematology and Oncology Follow Up Visit  Jesse [name] [id number] 09-30-1940 77 y.o. 10/20/2017   Principle Diagnosis:  Polycythemia vera - JAK2 negative  Current Therapy:   Phlebotomy to maintain hematocrit below 45% Aspirin 81 mg by mouth daily   Interim History:  [name] is here today for follow-up. He is feeling fatigued and complexion today is a little more ruddy. Hct is 48.2 so he will have a phlebotomy this visit.  He has had no fever, chills, n/v, cough, rash, dizziness, SOB, chest pain, palpitations, abdominal pain or changes in bowel or bladder habits.  No swelling, tenderness, numbness or tingling in his extremities. No c/o pain at this time.  No bleeding or bruising. No lymphadenopathy found on exam.  He has maintained a good appetite and is staying well hydrated. His weight is stable.   ECOG Performance Status: 1 - Symptomatic but completely ambulatory  Medications:  Allergies as of 10/20/2017   No Known Allergies     Medication List        Accurate as of 10/20/17 11:37 AM. Always use your most recent med list.          ALPRAZolam 0.5 MG tablet Commonly known as:  XANAX Take 0.25 mg by mouth at bedtime as needed. For sleep   aspirin EC 81 MG tablet Take 81 mg by mouth daily.   b complex vitamins capsule Take 1 capsule by mouth daily.   cholecalciferol 1000 units tablet Commonly known as:  VITAMIN D Take 1,000 Units by mouth daily.   fish oil-omega-3 fatty acids 1000 MG capsule Take 1 g by mouth daily.   lisinopril-hydrochlorothiazide 20-12.5 MG tablet Commonly known as:  PRINZIDE,ZESTORETIC Take 1 tablet by mouth daily.   simvastatin 20 MG tablet Commonly known as:  ZOCOR Take 20 mg by mouth at bedtime.       Allergies: No Known Allergies  Past Medical History, Surgical history, Social history, and Family History were reviewed and updated.  Review of Systems: All other 10 point review of systems is negative.   Physical Exam:  weight  is 161 lb (73 kg). His oral temperature is 98 F (36.7 C). His blood pressure is 129/83 and his pulse is 92. His respiration is 18 and oxygen saturation is 99%.   Wt Readings from Last 3 Encounters:  10/20/17 161 lb (73 kg)  08/19/17 158 lb (71.7 kg)  07/22/17 160 lb (72.6 kg)    Ocular: Sclerae unicteric, pupils equal, round and reactive to light Ear-nose-throat: Oropharynx clear, dentition fair Lymphatic: No cervical, supraclavicular or axillary adenopathy Lungs no rales or rhonchi, good excursion bilaterally Heart regular rate and rhythm, no murmur appreciated Abd soft, nontender, positive bowel sounds, no liver or spleen tip palpated on exam, no fluid wave  MSK no focal spinal tenderness, no joint edema Neuro: non-focal, well-oriented, appropriate affect Breasts: Deferred   Lab Results  Component Value Date   WBC 7.2 10/20/2017   HGB 16.1 08/19/2017   HCT 48.2 10/20/2017   MCV 89.3 10/20/2017   PLT 276 10/20/2017   Lab Results  Component Value Date   FERRITIN 27 07/22/2017   IRON 189 (H) 07/22/2017   TIBC 369 07/22/2017   UIBC 180 07/22/2017   IRONPCTSAT 51 07/22/2017   Lab Results  Component Value Date   RETICCTPCT 1.6 01/02/2011   RBC 5.40 10/20/2017   RETICCTABS 87.5 01/02/2011   No results found for: KPAFRELGTCHN, LAMBDASER, KAPLAMBRATIO No results found for: IGGSERUM, IGA, IGMSERUM No results found  for: Odetta Pink, SPEI   Chemistry      Component Value Date/Time   NA 136 08/19/2017 1149   NA 135 (L) 10/21/2016 1151   K 3.8 08/19/2017 1149   K 3.9 10/21/2016 1151   CL 95 (L) 08/19/2017 1149   CO2 28 08/19/2017 1149   CO2 25 10/21/2016 1151   BUN 14 08/19/2017 1149   BUN 13.6 10/21/2016 1151   CREATININE 1.0 08/19/2017 1149   CREATININE 0.8 10/21/2016 1151      Component Value Date/Time   CALCIUM 10.1 08/19/2017 1149   CALCIUM 10.3 10/21/2016 1151   ALKPHOS 68 08/19/2017 1149   ALKPHOS 68  10/21/2016 1151   AST 25 08/19/2017 1149   AST 25 10/21/2016 1151   ALT 32 08/19/2017 1149   ALT 38 10/21/2016 1151   BILITOT 1.50 08/19/2017 1149   BILITOT 1.22 (H) 10/21/2016 1151      Impression and Plan: [name] is a very pleasant 77 yo caucasian gentleman with polycythemia, JA2 negative. He is symptomatic with fatigue and ruddy complexion.  Hct today is 48.2 so we will proceed with his phlebotomy.  We will plan to see him back in another 2 months for follow-up.  He will contact our office with any questions or concerns. We can certainly see him sooner if need be.   Laverna Peace, NP 1/28/201911:37 AM

## 2017-10-20 NOTE — Progress Notes (Signed)
Jesse [name] presents today for phlebotomy per MD orders. Phlebotomy procedure started at 1210 and ended at 1217. 517 cc removed via 16 G needle at L antecubital site. Patient tolerated procedure well.

## 2017-11-03 DIAGNOSIS — H2513 Age-related nuclear cataract, bilateral: Secondary | ICD-10-CM | POA: Diagnosis not present

## 2017-11-03 DIAGNOSIS — I10 Essential (primary) hypertension: Secondary | ICD-10-CM | POA: Diagnosis not present

## 2017-11-03 DIAGNOSIS — H2511 Age-related nuclear cataract, right eye: Secondary | ICD-10-CM | POA: Diagnosis not present

## 2017-11-03 DIAGNOSIS — H25043 Posterior subcapsular polar age-related cataract, bilateral: Secondary | ICD-10-CM | POA: Diagnosis not present

## 2017-12-02 DIAGNOSIS — H2512 Age-related nuclear cataract, left eye: Secondary | ICD-10-CM | POA: Diagnosis not present

## 2017-12-02 DIAGNOSIS — H2511 Age-related nuclear cataract, right eye: Secondary | ICD-10-CM | POA: Diagnosis not present

## 2017-12-09 DIAGNOSIS — H2512 Age-related nuclear cataract, left eye: Secondary | ICD-10-CM | POA: Diagnosis not present

## 2017-12-09 DIAGNOSIS — H25812 Combined forms of age-related cataract, left eye: Secondary | ICD-10-CM | POA: Diagnosis not present

## 2017-12-18 ENCOUNTER — Inpatient Hospital Stay: Payer: Medicare Other

## 2017-12-18 ENCOUNTER — Inpatient Hospital Stay: Payer: Medicare Other | Attending: Family | Admitting: Family

## 2017-12-18 VITALS — BP 128/90 | HR 79 | Temp 98.2°F | Resp 18 | Wt 162.2 lb

## 2017-12-18 VITALS — BP 117/81 | HR 98 | Resp 20

## 2017-12-18 DIAGNOSIS — D500 Iron deficiency anemia secondary to blood loss (chronic): Secondary | ICD-10-CM

## 2017-12-18 DIAGNOSIS — D45 Polycythemia vera: Secondary | ICD-10-CM

## 2017-12-18 DIAGNOSIS — Z7982 Long term (current) use of aspirin: Secondary | ICD-10-CM | POA: Insufficient documentation

## 2017-12-18 LAB — FERRITIN: Ferritin: 18 ng/mL — ABNORMAL LOW (ref 22–316)

## 2017-12-18 LAB — IRON AND TIBC
Iron: 65 ug/dL (ref 42–163)
Saturation Ratios: 17 % — ABNORMAL LOW (ref 42–163)
TIBC: 384 ug/dL (ref 202–409)
UIBC: 319 ug/dL

## 2017-12-18 LAB — CBC WITH DIFFERENTIAL (CANCER CENTER ONLY)
Basophils Absolute: 0 10*3/uL (ref 0.0–0.1)
Basophils Relative: 0 %
Eosinophils Absolute: 0.1 10*3/uL (ref 0.0–0.5)
Eosinophils Relative: 1 %
HCT: 47.5 % (ref 38.7–49.9)
Hemoglobin: 15.8 g/dL (ref 13.0–17.1)
Lymphocytes Relative: 28 %
Lymphs Abs: 2.2 10*3/uL (ref 0.9–3.3)
MCH: 28.9 pg (ref 28.0–33.4)
MCHC: 33.3 g/dL (ref 32.0–35.9)
MCV: 86.8 fL (ref 82.0–98.0)
Monocytes Absolute: 1.3 10*3/uL — ABNORMAL HIGH (ref 0.1–0.9)
Monocytes Relative: 16 %
Neutro Abs: 4.3 10*3/uL (ref 1.5–6.5)
Neutrophils Relative %: 55 %
Platelet Count: 292 10*3/uL (ref 145–400)
RBC: 5.47 MIL/uL (ref 4.20–5.70)
RDW: 15.3 % (ref 11.1–15.7)
WBC Count: 7.8 10*3/uL (ref 4.0–10.0)

## 2017-12-18 LAB — CMP (CANCER CENTER ONLY)
ALT: 30 U/L (ref 0–55)
AST: 22 U/L (ref 5–34)
Albumin: 3.9 g/dL (ref 3.5–5.0)
Alkaline Phosphatase: 59 U/L (ref 40–150)
Anion gap: 9 (ref 3–11)
BUN: 12 mg/dL (ref 7–26)
CO2: 27 mmol/L (ref 22–29)
Calcium: 10.7 mg/dL — ABNORMAL HIGH (ref 8.4–10.4)
Chloride: 99 mmol/L (ref 98–109)
Creatinine: 0.8 mg/dL (ref 0.70–1.30)
GFR, Est AFR Am: >60 mL/min (ref >60)
GFR, Estimated: >60 mL/min (ref >60)
Glucose, Bld: 91 mg/dL (ref 70–140)
Potassium: 4.2 mmol/L (ref 3.5–5.1)
Sodium: 135 mmol/L — ABNORMAL LOW (ref 136–145)
Total Bilirubin: 1.2 mg/dL (ref 0.2–1.2)
Total Protein: 7.2 g/dL (ref 6.4–8.3)

## 2017-12-18 NOTE — Progress Notes (Signed)
Jesse [name] presents today for phlebotomy per MD orders. Phlebotomy procedure started at 1105 and ended at 1115 Via 18 ga angio cath by C. [name] Heck RN to right ac.   Pressure dressing applied.  520 grams removed. Patient refused to stay for 30 minutes after phlebotomy, stayed for 20 minutes only.  Patient tolerated procedure well. IV needle removed intact.  Pressure dressing clean, dry and intact to right ac at time of discharge.  VS stable and pt without complaints.

## 2017-12-18 NOTE — Progress Notes (Signed)
Hematology and Oncology Follow Up Visit  Jesse [name] [id number] 1941-01-29 77 y.o. 12/18/2017   Principle Diagnosis:  Polycythemia vera - JAK2 negative  Current Therapy:   Phlebotomy to maintain hematocrit below 45% Aspirin 81 mg by mouth daily   Interim History:  Jesse [name] is here today for follow-up. He is feeling fatigued and having stiffness in his joints. Hct is 47.5.  No episodes of bleeding, no bruising or petechiae.  He recently had cataract surgery on both eyes and did quite well.  No fever, chills, n/v, cough, rash, dizziness, SOB, chest pain, palpitations, abdominal pain or changes in bowel or bladder habits.  No swelling, numbness or tingling in his extremities. No lymphadenopathy found on exam.  He has maintained a good appetite and is staying well hydrated. His weight is stable.   ECOG Performance Status: 1 - Symptomatic but completely ambulatory  Medications:  Allergies as of 12/18/2017   No Known Allergies     Medication List        Accurate as of 12/18/17 10:34 AM. Always use your most recent med list.          ALPRAZolam 0.5 MG tablet Commonly known as:  XANAX Take 0.25 mg by mouth at bedtime as needed. For sleep   aspirin EC 81 MG tablet Take 81 mg by mouth daily.   b complex vitamins capsule Take 1 capsule by mouth daily.   cholecalciferol 1000 units tablet Commonly known as:  VITAMIN D Take 1,000 Units by mouth daily.   fish oil-omega-3 fatty acids 1000 MG capsule Take 1 g by mouth daily.   lisinopril-hydrochlorothiazide 20-12.5 MG tablet Commonly known as:  PRINZIDE,ZESTORETIC Take 1 tablet by mouth daily.   simvastatin 20 MG tablet Commonly known as:  ZOCOR Take 20 mg by mouth at bedtime.       Allergies: No Known Allergies  Past Medical History, Surgical history, Social history, and Family History were reviewed and updated.  Review of Systems: All other 10 point review of systems is negative.   Physical Exam:  vitals  were not taken for this visit.   Wt Readings from Last 3 Encounters:  10/20/17 161 lb (73 kg)  08/19/17 158 lb (71.7 kg)  07/22/17 160 lb (72.6 kg)    Ocular: Sclerae unicteric, pupils equal, round and reactive to light Ear-nose-throat: Oropharynx clear, dentition fair Lymphatic: No cervical, supraclavicular or axillary adenopathy Lungs no rales or rhonchi, good excursion bilaterally Heart regular rate and rhythm, no murmur appreciated Abd soft, nontender, positive bowel sounds, no liver or spleen tip palpated on exam, no fluid wave  MSK no focal spinal tenderness, no joint edema Neuro: non-focal, well-oriented, appropriate affect Breasts: Deferred   Lab Results  Component Value Date   WBC 7.8 12/18/2017   HGB 16.1 08/19/2017   HCT 47.5 12/18/2017   MCV 86.8 12/18/2017   PLT 292 12/18/2017   Lab Results  Component Value Date   FERRITIN 21 (L) 10/20/2017   IRON 94 10/20/2017   TIBC 376 10/20/2017   UIBC 283 10/20/2017   IRONPCTSAT 25 (L) 10/20/2017   Lab Results  Component Value Date   RETICCTPCT 1.6 01/02/2011   RBC 5.47 12/18/2017   RETICCTABS 87.5 01/02/2011   No results found for: KPAFRELGTCHN, LAMBDASER, KAPLAMBRATIO No results found for: IGGSERUM, IGA, IGMSERUM No results found for: TOTALPROTELP, ALBUMINELP, A1GS, A2GS, BETS, BETA2SER, GAMS, MSPIKE, SPEI   Chemistry      Component Value Date/Time   NA 134 (L) 10/20/2017 1050  NA 136 08/19/2017 1149   NA 135 (L) 10/21/2016 1151   K 3.8 10/20/2017 1050   K 3.8 08/19/2017 1149   K 3.9 10/21/2016 1151   CL 97 (L) 10/20/2017 1050   CL 95 (L) 08/19/2017 1149   CO2 28 10/20/2017 1050   CO2 28 08/19/2017 1149   CO2 25 10/21/2016 1151   BUN 14 10/20/2017 1050   BUN 14 08/19/2017 1149   BUN 13.6 10/21/2016 1151   CREATININE 0.91 10/20/2017 1050   CREATININE 1.0 08/19/2017 1149   CREATININE 0.8 10/21/2016 1151      Component Value Date/Time   CALCIUM 10.3 10/20/2017 1050   CALCIUM 10.1 08/19/2017 1149    CALCIUM 10.3 10/21/2016 1151   ALKPHOS 64 10/20/2017 1050   ALKPHOS 68 08/19/2017 1149   ALKPHOS 68 10/21/2016 1151   AST 25 10/20/2017 1050   AST 25 10/21/2016 1151   ALT 27 10/20/2017 1050   ALT 32 08/19/2017 1149   ALT 38 10/21/2016 1151   BILITOT 1.4 (H) 10/20/2017 1050   BILITOT 1.22 (H) 10/21/2016 1151      Impression and Plan: Jesse [name] is a very pleasant 77 yo caucasian gentleman with polycythemia, JAK-2 negative. He is symptomatic with fatigue and stiff joints.  Hct today is 47.5 so we will proceed with phlebotomy.  We will plan to see him back in another 2 months for follow-up.  He will contact our office with any questions or concerns. We can certainly see her sooner if need be.   Laverna Peace, NP 3/28/201910:34 AM

## 2018-02-19 ENCOUNTER — Other Ambulatory Visit: Payer: Self-pay

## 2018-02-19 ENCOUNTER — Inpatient Hospital Stay: Payer: Medicare Other

## 2018-02-19 ENCOUNTER — Inpatient Hospital Stay: Payer: Medicare Other | Attending: Family | Admitting: Family

## 2018-02-19 ENCOUNTER — Encounter: Payer: Self-pay | Admitting: Family

## 2018-02-19 VITALS — BP 124/78 | HR 89 | Temp 98.2°F | Resp 18 | Wt 161.0 lb

## 2018-02-19 DIAGNOSIS — D500 Iron deficiency anemia secondary to blood loss (chronic): Secondary | ICD-10-CM

## 2018-02-19 DIAGNOSIS — D45 Polycythemia vera: Secondary | ICD-10-CM

## 2018-02-19 LAB — CMP (CANCER CENTER ONLY)
ALT: 24 U/L (ref 0–55)
AST: 26 U/L (ref 5–34)
Albumin: 3.9 g/dL (ref 3.5–5.0)
Alkaline Phosphatase: 51 U/L (ref 40–150)
Anion gap: 9 (ref 3–11)
BUN: 11 mg/dL (ref 7–26)
CO2: 27 mmol/L (ref 22–29)
Calcium: 9.8 mg/dL (ref 8.4–10.4)
Chloride: 95 mmol/L — ABNORMAL LOW (ref 98–109)
Creatinine: 0.83 mg/dL (ref 0.70–1.30)
GFR, Est AFR Am: >60 mL/min (ref >60)
GFR, Estimated: >60 mL/min (ref >60)
Glucose, Bld: 93 mg/dL (ref 70–140)
Potassium: 3.4 mmol/L — ABNORMAL LOW (ref 3.5–5.1)
Sodium: 131 mmol/L — ABNORMAL LOW (ref 136–145)
Total Bilirubin: 1.6 mg/dL — ABNORMAL HIGH (ref 0.2–1.2)
Total Protein: 6.9 g/dL (ref 6.4–8.3)

## 2018-02-19 LAB — CBC WITH DIFFERENTIAL (CANCER CENTER ONLY)
Basophils Absolute: 0 10*3/uL (ref 0.0–0.1)
Basophils Relative: 0 %
Eosinophils Absolute: 0 10*3/uL (ref 0.0–0.5)
Eosinophils Relative: 1 %
HCT: 44.9 % (ref 38.7–49.9)
Hemoglobin: 15.2 g/dL (ref 13.0–17.1)
Lymphocytes Relative: 29 %
Lymphs Abs: 1.9 10*3/uL (ref 0.9–3.3)
MCH: 28.7 pg (ref 28.0–33.4)
MCHC: 33.9 g/dL (ref 32.0–35.9)
MCV: 84.7 fL (ref 82.0–98.0)
Monocytes Absolute: 0.9 10*3/uL (ref 0.1–0.9)
Monocytes Relative: 14 %
Neutro Abs: 3.8 10*3/uL (ref 1.5–6.5)
Neutrophils Relative %: 56 %
Platelet Count: 277 10*3/uL (ref 145–400)
RBC: 5.3 MIL/uL (ref 4.20–5.70)
RDW: 15.3 % (ref 11.1–15.7)
WBC Count: 6.7 10*3/uL (ref 4.0–10.0)

## 2018-02-19 NOTE — Progress Notes (Signed)
Hematology and Oncology Follow Up Visit  Jesse [name] [id number] March 27, 1941 77 y.o. 02/19/2018   Principle Diagnosis:  Polycythemia vera - JAK2 negative  Current Therapy:   Phlebotomy to maintain hematocrit below 45% Aspirin 81 mg by mouth daily   Interim History:  Jesse [name] is here today for follow-up. He is doing well and has no complaints at this time.  Hct is 44.9%. He denies fatigue.  No fever, chills, n/v, cough, rash, hot flashes/night sweats, dizziness, headache, blurred vision, SOB, chest pain, palpitations, abdominal pain or changes in bowel or bladder habits.  No lymphadenopathy noted on exam.  No swelling, tenderness, numbness or tingling in his extremities. No c/o pain.  No episodes of bleeding, no bruising or petechiae.  He is eating well and staying hydrated. His weight is stable.   ECOG Performance Status: 0 - Asymptomatic  Medications:  Allergies as of 02/19/2018   No Known Allergies     Medication List        Accurate as of 02/19/18 11:26 AM. Always use your most recent med list.          ALPRAZolam 0.5 MG tablet Commonly known as:  XANAX Take 0.25 mg by mouth at bedtime as needed. For sleep   aspirin EC 81 MG tablet Take 81 mg by mouth daily.   cholecalciferol 1000 units tablet Commonly known as:  VITAMIN D Take 1,000 Units by mouth daily.   fish oil-omega-3 fatty acids 1000 MG capsule Take 1 g by mouth daily.   lisinopril-hydrochlorothiazide 20-12.5 MG tablet Commonly known as:  PRINZIDE,ZESTORETIC Take 1 tablet by mouth daily.   simvastatin 20 MG tablet Commonly known as:  ZOCOR Take 20 mg by mouth at bedtime.   vitamin B-12 1000 MCG tablet Commonly known as:  CYANOCOBALAMIN Take 1,000 mcg by mouth once a week.       Allergies: No Known Allergies  Past Medical History, Surgical history, Social history, and Family History were reviewed and updated.  Review of Systems: All other 10 point review of systems is negative.    Physical Exam:  weight is 161 lb (73 kg). His oral temperature is 98.2 F (36.8 C). His blood pressure is 124/78 and his pulse is 89. His respiration is 18 and oxygen saturation is 97%.   Wt Readings from Last 3 Encounters:  02/19/18 161 lb (73 kg)  12/18/17 162 lb 4 oz (73.6 kg)  10/20/17 161 lb (73 kg)    Ocular: Sclerae unicteric, pupils equal, round and reactive to light Ear-nose-throat: Oropharynx clear, dentition fair Lymphatic: No cervical, supraclavicular or axillary adenopathy Lungs no rales or rhonchi, good excursion bilaterally Heart regular rate and rhythm, no murmur appreciated Abd soft, nontender, positive bowel sounds, no liver or spleen tip palpated on exam, no fluid wave MSK no focal spinal tenderness, no joint edema Neuro: non-focal, well-oriented, appropriate affect Breasts: Deferred   Lab Results  Component Value Date   WBC 6.7 02/19/2018   HGB 15.2 02/19/2018   HCT 44.9 02/19/2018   MCV 84.7 02/19/2018   PLT 277 02/19/2018   Lab Results  Component Value Date   FERRITIN 18 (L) 12/18/2017   IRON 65 12/18/2017   TIBC 384 12/18/2017   UIBC 319 12/18/2017   IRONPCTSAT 17 (L) 12/18/2017   Lab Results  Component Value Date   RETICCTPCT 1.6 01/02/2011   RBC 5.30 02/19/2018   RETICCTABS 87.5 01/02/2011   No results found for: KPAFRELGTCHN, LAMBDASER, KAPLAMBRATIO No results found for: IGGSERUM, IGA, IGMSERUM  No results found for: Odetta Pink, SPEI   Chemistry      Component Value Date/Time   NA 135 (L) 12/18/2017 1014   NA 136 08/19/2017 1149   NA 135 (L) 10/21/2016 1151   K 4.2 12/18/2017 1014   K 3.8 08/19/2017 1149   K 3.9 10/21/2016 1151   CL 99 12/18/2017 1014   CL 95 (L) 08/19/2017 1149   CO2 27 12/18/2017 1014   CO2 28 08/19/2017 1149   CO2 25 10/21/2016 1151   BUN 12 12/18/2017 1014   BUN 14 08/19/2017 1149   BUN 13.6 10/21/2016 1151   CREATININE 0.80 12/18/2017 1014    CREATININE 1.0 08/19/2017 1149   CREATININE 0.8 10/21/2016 1151      Component Value Date/Time   CALCIUM 10.7 (H) 12/18/2017 1014   CALCIUM 10.1 08/19/2017 1149   CALCIUM 10.3 10/21/2016 1151   ALKPHOS 59 12/18/2017 1014   ALKPHOS 68 08/19/2017 1149   ALKPHOS 68 10/21/2016 1151   AST 22 12/18/2017 1014   AST 25 10/21/2016 1151   ALT 30 12/18/2017 1014   ALT 32 08/19/2017 1149   ALT 38 10/21/2016 1151   BILITOT 1.2 12/18/2017 1014   BILITOT 1.22 (H) 10/21/2016 1151      Impression and Plan: Jesse [name] is a very pleasant 77 yo caucasian gentleman with with polycythemia, JAK-2 negative. He is doing well and has no complaints at this time.  Hct is 44.9%. We talked about doing a mini phlebotomy but he states he would rather wait and come back in 6 weeks for follow-up, lab and phlebotomy if needed.  He will contact our office with any questions or concerns. We can certainly see him sooner if need be.   Laverna Peace, NP 5/30/201911:26 AM

## 2018-02-20 LAB — IRON AND TIBC
Iron: 70 ug/dL (ref 42–163)
Saturation Ratios: 18 % — ABNORMAL LOW (ref 42–163)
TIBC: 378 ug/dL (ref 202–409)
UIBC: 308 ug/dL

## 2018-02-20 LAB — FERRITIN: Ferritin: 20 ng/mL — ABNORMAL LOW (ref 22–316)

## 2018-03-18 ENCOUNTER — Ambulatory Visit (INDEPENDENT_AMBULATORY_CARE_PROVIDER_SITE_OTHER): Payer: Medicare Other | Admitting: Ophthalmology

## 2018-03-18 DIAGNOSIS — I10 Essential (primary) hypertension: Secondary | ICD-10-CM | POA: Diagnosis not present

## 2018-03-18 DIAGNOSIS — H338 Other retinal detachments: Secondary | ICD-10-CM

## 2018-03-18 DIAGNOSIS — H33301 Unspecified retinal break, right eye: Secondary | ICD-10-CM | POA: Diagnosis not present

## 2018-03-18 DIAGNOSIS — H35033 Hypertensive retinopathy, bilateral: Secondary | ICD-10-CM

## 2018-03-18 DIAGNOSIS — H43811 Vitreous degeneration, right eye: Secondary | ICD-10-CM

## 2018-04-01 ENCOUNTER — Inpatient Hospital Stay: Payer: Medicare Other

## 2018-04-01 ENCOUNTER — Inpatient Hospital Stay: Payer: Medicare Other | Attending: Family | Admitting: Family

## 2018-04-01 ENCOUNTER — Other Ambulatory Visit: Payer: Self-pay

## 2018-04-01 ENCOUNTER — Other Ambulatory Visit: Payer: Self-pay | Admitting: Family

## 2018-04-01 ENCOUNTER — Encounter: Payer: Self-pay | Admitting: Family

## 2018-04-01 VITALS — BP 110/67 | HR 75 | Temp 98.4°F | Resp 20 | Wt 158.8 lb

## 2018-04-01 VITALS — BP 126/72 | HR 78 | Resp 18

## 2018-04-01 DIAGNOSIS — D45 Polycythemia vera: Secondary | ICD-10-CM | POA: Diagnosis not present

## 2018-04-01 DIAGNOSIS — M1990 Unspecified osteoarthritis, unspecified site: Secondary | ICD-10-CM | POA: Diagnosis not present

## 2018-04-01 DIAGNOSIS — D500 Iron deficiency anemia secondary to blood loss (chronic): Secondary | ICD-10-CM

## 2018-04-01 LAB — CBC WITH DIFFERENTIAL (CANCER CENTER ONLY)
Basophils Absolute: 0 10*3/uL (ref 0.0–0.1)
Basophils Relative: 0 %
Eosinophils Absolute: 0.1 10*3/uL (ref 0.0–0.5)
Eosinophils Relative: 1 %
HCT: 47.2 % (ref 38.7–49.9)
Hemoglobin: 15.7 g/dL (ref 13.0–17.1)
Lymphocytes Relative: 30 %
Lymphs Abs: 1.8 10*3/uL (ref 0.9–3.3)
MCH: 28.6 pg (ref 28.0–33.4)
MCHC: 33.3 g/dL (ref 32.0–35.9)
MCV: 86.1 fL (ref 82.0–98.0)
Monocytes Absolute: 1 10*3/uL — ABNORMAL HIGH (ref 0.1–0.9)
Monocytes Relative: 17 %
Neutro Abs: 3.2 10*3/uL (ref 1.5–6.5)
Neutrophils Relative %: 52 %
Platelet Count: 244 10*3/uL (ref 145–400)
RBC: 5.48 MIL/uL (ref 4.20–5.70)
RDW: 16.5 % — ABNORMAL HIGH (ref 11.1–15.7)
WBC Count: 6.1 10*3/uL (ref 4.0–10.0)

## 2018-04-01 LAB — CMP (CANCER CENTER ONLY)
ALT: 30 U/L (ref 0–44)
AST: 22 U/L (ref 15–41)
Albumin: 4 g/dL (ref 3.5–5.0)
Alkaline Phosphatase: 54 U/L (ref 38–126)
Anion gap: 9 (ref 5–15)
BUN: 12 mg/dL (ref 8–23)
CO2: 27 mmol/L (ref 22–32)
Calcium: 10 mg/dL (ref 8.9–10.3)
Chloride: 96 mmol/L — ABNORMAL LOW (ref 98–111)
Creatinine: 0.86 mg/dL (ref 0.61–1.24)
GFR, Est AFR Am: >60 mL/min (ref >60)
GFR, Estimated: >60 mL/min (ref >60)
Glucose, Bld: 88 mg/dL (ref 70–99)
Potassium: 3.8 mmol/L (ref 3.5–5.1)
Sodium: 132 mmol/L — ABNORMAL LOW (ref 135–145)
Total Bilirubin: 1.7 mg/dL — ABNORMAL HIGH (ref 0.3–1.2)
Total Protein: 7 g/dL (ref 6.5–8.1)

## 2018-04-01 LAB — IRON AND TIBC
Iron: 95 ug/dL (ref 42–163)
Saturation Ratios: 25 % — ABNORMAL LOW (ref 42–163)
TIBC: 383 ug/dL (ref 202–409)
UIBC: 288 ug/dL

## 2018-04-01 LAB — FERRITIN: Ferritin: 22 ng/mL — ABNORMAL LOW (ref 24–336)

## 2018-04-01 NOTE — Progress Notes (Signed)
Jesse [name] presents today for phlebotomy per MD orders. Phlebotomy procedure started at 0958 and ended at 1003. 510 cc removed via 16 G needle at L antecubital site. Patient tolerated procedure well.

## 2018-04-01 NOTE — Patient Instructions (Signed)

## 2018-04-01 NOTE — Progress Notes (Signed)
Hematology and Oncology Follow Up Visit  Jesse [name] [id number] 02/17/1941 77 y.o. 04/01/2018   Principle Diagnosis:  Polycythemia vera - JAK2 negative  Current Therapy:   Phlebotomy to maintain hematocrit below 45% Aspirin 81 mg by mouth daily   Interim History: Jesse [name] is here today for follow-up. He is feeling fatigued and having increased joint aches and pains with arthritis.  Hct is 47.2%.  He has had no fever, chills, n/v, cough, rash, hot flashes/night sweats, dizziness, SOB, chest pain, palpitations, abdominal pain or changes in bowel or bladder habits.  No episodes of bleeding. His skin is thin and he does bruise easily on baby aspirin.  No lymphadenopathy noted on exam.  No swelling, numbness or tingling in his extremities at this time.  His appetite comes and goes but he is staying well hydrated. His weight is stable.   ECOG Performance Status: 1 - Symptomatic but completely ambulatory  Medications:  Allergies as of 04/01/2018   No Known Allergies     Medication List        Accurate as of 04/01/18  9:46 AM. Always use your most recent med list.          ALPRAZolam 0.5 MG tablet Commonly known as:  XANAX Take 0.25 mg by mouth at bedtime as needed. For sleep   aspirin EC 81 MG tablet Take 81 mg by mouth daily.   cholecalciferol 1000 units tablet Commonly known as:  VITAMIN D Take 1,000 Units by mouth daily.   fish oil-omega-3 fatty acids 1000 MG capsule Take 1 g by mouth daily.   lisinopril-hydrochlorothiazide 20-12.5 MG tablet Commonly known as:  PRINZIDE,ZESTORETIC Take 1 tablet by mouth daily.   simvastatin 20 MG tablet Commonly known as:  ZOCOR Take 20 mg by mouth at bedtime.   vitamin B-12 1000 MCG tablet Commonly known as:  CYANOCOBALAMIN Take 1,000 mcg by mouth once a week.       Allergies: No Known Allergies  Past Medical History, Surgical history, Social history, and Family History were reviewed and updated.  Review of  Systems: All other 10 point review of systems is negative.   Physical Exam:  weight is 158 lb 12.8 oz (72 kg). His oral temperature is 98.4 F (36.9 C). His blood pressure is 110/67 and his pulse is 75. His respiration is 20 and oxygen saturation is 98%.   Wt Readings from Last 3 Encounters:  04/01/18 158 lb 12.8 oz (72 kg)  02/19/18 161 lb (73 kg)  12/18/17 162 lb 4 oz (73.6 kg)    Ocular: Sclerae unicteric, pupils equal, round and reactive to light Ear-nose-throat: Oropharynx clear, dentition fair Lymphatic: No cervical, supraclavicular or axillary adenopathy Lungs no rales or rhonchi, good excursion bilaterally Heart regular rate and rhythm, no murmur appreciated Abd soft, nontender, positive bowel sounds, no liver or spleen tip palpated on exam, no fluid wave  MSK no focal spinal tenderness, no joint edema Neuro: non-focal, well-oriented, appropriate affect Breasts: Deferred   Lab Results  Component Value Date   WBC 6.1 04/01/2018   HGB 15.7 04/01/2018   HCT 47.2 04/01/2018   MCV 86.1 04/01/2018   PLT 244 04/01/2018   Lab Results  Component Value Date   FERRITIN 20 (L) 02/19/2018   IRON 70 02/19/2018   TIBC 378 02/19/2018   UIBC 308 02/19/2018   IRONPCTSAT 18 (L) 02/19/2018   Lab Results  Component Value Date   RETICCTPCT 1.6 01/02/2011   RBC 5.48 04/01/2018   RETICCTABS  87.5 01/02/2011   No results found for: KPAFRELGTCHN, LAMBDASER, KAPLAMBRATIO No results found for: Kandis Cocking, IGMSERUM No results found for: Kathrynn Ducking, MSPIKE, SPEI   Chemistry      Component Value Date/Time   NA 131 (L) 02/19/2018 1100   NA 136 08/19/2017 1149   NA 135 (L) 10/21/2016 1151   K 3.4 (L) 02/19/2018 1100   K 3.8 08/19/2017 1149   K 3.9 10/21/2016 1151   CL 95 (L) 02/19/2018 1100   CL 95 (L) 08/19/2017 1149   CO2 27 02/19/2018 1100   CO2 28 08/19/2017 1149   CO2 25 10/21/2016 1151   BUN 11 02/19/2018 1100   BUN 14  08/19/2017 1149   BUN 13.6 10/21/2016 1151   CREATININE 0.83 02/19/2018 1100   CREATININE 1.0 08/19/2017 1149   CREATININE 0.8 10/21/2016 1151      Component Value Date/Time   CALCIUM 9.8 02/19/2018 1100   CALCIUM 10.1 08/19/2017 1149   CALCIUM 10.3 10/21/2016 1151   ALKPHOS 51 02/19/2018 1100   ALKPHOS 68 08/19/2017 1149   ALKPHOS 68 10/21/2016 1151   AST 26 02/19/2018 1100   AST 25 10/21/2016 1151   ALT 24 02/19/2018 1100   ALT 32 08/19/2017 1149   ALT 38 10/21/2016 1151   BILITOT 1.6 (H) 02/19/2018 1100   BILITOT 1.22 (H) 10/21/2016 1151      Impression and Plan: Jesse [name] is a very pleasant 77 yo caucasian gentleman with polycythemia, JAK-2 negative. He is symptomatic at this time with fatigue and increased joint aches and pains. Hct is 47.2%.  We will proceed with phlebotomy today as planned and see him back in another 6 weeks for follow-up.  He will contact our office with any questions or concerns. We can certainly see her sooner if need be.    Laverna Peace, NP 7/10/20199:46 AM

## 2018-05-06 ENCOUNTER — Other Ambulatory Visit: Payer: Medicare Other

## 2018-05-06 ENCOUNTER — Ambulatory Visit: Payer: Medicare Other | Admitting: Hematology & Oncology

## 2018-05-14 ENCOUNTER — Inpatient Hospital Stay: Payer: Medicare Other

## 2018-05-14 ENCOUNTER — Inpatient Hospital Stay (HOSPITAL_BASED_OUTPATIENT_CLINIC_OR_DEPARTMENT_OTHER): Payer: Medicare Other | Admitting: Hematology & Oncology

## 2018-05-14 ENCOUNTER — Inpatient Hospital Stay: Payer: Medicare Other | Attending: Family

## 2018-05-14 ENCOUNTER — Other Ambulatory Visit: Payer: Self-pay

## 2018-05-14 ENCOUNTER — Encounter: Payer: Self-pay | Admitting: Hematology & Oncology

## 2018-05-14 VITALS — BP 114/74 | HR 78 | Temp 97.6°F | Resp 16 | Wt 156.0 lb

## 2018-05-14 DIAGNOSIS — D45 Polycythemia vera: Secondary | ICD-10-CM | POA: Diagnosis not present

## 2018-05-14 DIAGNOSIS — D500 Iron deficiency anemia secondary to blood loss (chronic): Secondary | ICD-10-CM

## 2018-05-14 LAB — CBC WITH DIFFERENTIAL (CANCER CENTER ONLY)
Basophils Absolute: 0 10*3/uL (ref 0.0–0.1)
Basophils Relative: 0 %
Eosinophils Absolute: 0.1 10*3/uL (ref 0.0–0.5)
Eosinophils Relative: 1 %
HCT: 46.5 % (ref 38.7–49.9)
Hemoglobin: 15.5 g/dL (ref 13.0–17.1)
Lymphocytes Relative: 31 %
Lymphs Abs: 2.1 10*3/uL (ref 0.9–3.3)
MCH: 28.7 pg (ref 28.0–33.4)
MCHC: 33.3 g/dL (ref 32.0–35.9)
MCV: 86.1 fL (ref 82.0–98.0)
Monocytes Absolute: 1.3 10*3/uL — ABNORMAL HIGH (ref 0.1–0.9)
Monocytes Relative: 18 %
Neutro Abs: 3.4 10*3/uL (ref 1.5–6.5)
Neutrophils Relative %: 50 %
Platelet Count: 298 10*3/uL (ref 145–400)
RBC: 5.4 MIL/uL (ref 4.20–5.70)
RDW: 15.5 % (ref 11.1–15.7)
WBC Count: 6.8 10*3/uL (ref 4.0–10.0)

## 2018-05-14 LAB — CMP (CANCER CENTER ONLY)
ALT: 26 U/L (ref 0–44)
AST: 22 U/L (ref 15–41)
Albumin: 3.8 g/dL (ref 3.5–5.0)
Alkaline Phosphatase: 54 U/L (ref 38–126)
Anion gap: 8 (ref 5–15)
BUN: 15 mg/dL (ref 8–23)
CO2: 27 mmol/L (ref 22–32)
Calcium: 10.2 mg/dL (ref 8.9–10.3)
Chloride: 98 mmol/L (ref 98–111)
Creatinine: 0.79 mg/dL (ref 0.61–1.24)
GFR, Est AFR Am: >60 mL/min (ref >60)
GFR, Estimated: >60 mL/min (ref >60)
Glucose, Bld: 91 mg/dL (ref 70–99)
Potassium: 3.8 mmol/L (ref 3.5–5.1)
Sodium: 133 mmol/L — ABNORMAL LOW (ref 135–145)
Total Bilirubin: 1.5 mg/dL — ABNORMAL HIGH (ref 0.3–1.2)
Total Protein: 6.9 g/dL (ref 6.5–8.1)

## 2018-05-14 LAB — FERRITIN: Ferritin: 20 ng/mL — ABNORMAL LOW (ref 24–336)

## 2018-05-14 LAB — IRON AND TIBC
Iron: 78 ug/dL (ref 42–163)
Saturation Ratios: 20 % — ABNORMAL LOW (ref 42–163)
TIBC: 390 ug/dL (ref 202–409)
UIBC: 312 ug/dL

## 2018-05-14 NOTE — Progress Notes (Signed)
Hematology and Oncology Follow Up Visit  Jesse [name] [id number] 1941/01/27 77 y.o. 05/14/2018   Principle Diagnosis:  Polycythemia vera - JAK2 negative  Current Therapy:   Phlebotomy to maintain hematocrit below 45% Aspirin 81 mg by mouth daily   Interim History: Jesse [name] is here today for follow-up.  He actually feels pretty good.  He is really not done all that much this summer.  He does play a little to golf.  He is had no real complaints of being fatigued.  He has had no headaches.  He has had no visual changes.  When I saw him in July, his iron studies showed a ferritin of 22 with an iron saturation of 25%.  He has had no nausea or vomiting.  There is been no fever.  Overall, his performance status is ECOG 1.  MEDICATIONS Allergies as of 05/14/2018   No Known Allergies     Medication List        Accurate as of 05/14/18 10:43 AM. Always use your most recent med list.          ALPRAZolam 0.5 MG tablet Commonly known as:  XANAX Take 0.25 mg by mouth at bedtime as needed. For sleep   aspirin EC 81 MG tablet Take 81 mg by mouth daily.   cholecalciferol 1000 units tablet Commonly known as:  VITAMIN D Take 1,000 Units by mouth daily.   fish oil-omega-3 fatty acids 1000 MG capsule Take 1 g by mouth daily.   lisinopril-hydrochlorothiazide 20-12.5 MG tablet Commonly known as:  PRINZIDE,ZESTORETIC Take 1 tablet by mouth daily.   simvastatin 20 MG tablet Commonly known as:  ZOCOR Take 20 mg by mouth at bedtime.   vitamin B-12 1000 MCG tablet Commonly known as:  CYANOCOBALAMIN Take 1,000 mcg by mouth once a week.       Allergies: No Known Allergies  Past Medical History, Surgical history, Social history, and Family History were reviewed and updated.  Review of Systems: Review of Systems  Constitutional: Negative.   HENT: Negative.   Eyes: Negative.   Respiratory: Negative.   Cardiovascular: Negative.   Gastrointestinal: Negative.     Genitourinary: Negative.   Musculoskeletal: Negative.   Skin: Negative.   Neurological: Negative.   Endo/Heme/Allergies: Negative.   Psychiatric/Behavioral: Negative.       Physical Exam:  weight is 156 lb (70.8 kg). His oral temperature is 97.6 F (36.4 C). His blood pressure is 114/74 and his pulse is 78. His respiration is 16 and oxygen saturation is 100%.   Wt Readings from Last 3 Encounters:  05/14/18 156 lb (70.8 kg)  04/01/18 158 lb 12.8 oz (72 kg)  02/19/18 161 lb (73 kg)    Physical Exam  Constitutional: He is oriented to person, place, and time.  HENT:  Head: Normocephalic and atraumatic.  Mouth/Throat: Oropharynx is clear and moist.  Eyes: Pupils are equal, round, and reactive to light. EOM are normal.  Neck: Normal range of motion.  Cardiovascular: Normal rate, regular rhythm and normal heart sounds.  Pulmonary/Chest: Effort normal and breath sounds normal.  Abdominal: Soft. Bowel sounds are normal.  Musculoskeletal: Normal range of motion. He exhibits no edema, tenderness or deformity.  Lymphadenopathy:    He has no cervical adenopathy.  Neurological: He is alert and oriented to person, place, and time.  Skin: Skin is warm and dry. No rash noted. No erythema.  Psychiatric: He has a normal mood and affect. His behavior is normal. Judgment and thought content normal.  Vitals reviewed.    Lab Results  Component Value Date   WBC 6.8 05/14/2018   HGB 15.5 05/14/2018   HCT 46.5 05/14/2018   MCV 86.1 05/14/2018   PLT 298 05/14/2018   Lab Results  Component Value Date   FERRITIN 22 (L) 04/01/2018   IRON 95 04/01/2018   TIBC 383 04/01/2018   UIBC 288 04/01/2018   IRONPCTSAT 25 (L) 04/01/2018   Lab Results  Component Value Date   RETICCTPCT 1.6 01/02/2011   RBC 5.40 05/14/2018   RETICCTABS 87.5 01/02/2011   No results found for: KPAFRELGTCHN, LAMBDASER, KAPLAMBRATIO No results found for: Kandis Cocking, IGMSERUM No results found for: Kathrynn Ducking, MSPIKE, SPEI   Chemistry      Component Value Date/Time   NA 132 (L) 04/01/2018 0907   NA 136 08/19/2017 1149   NA 135 (L) 10/21/2016 1151   K 3.8 04/01/2018 0907   K 3.8 08/19/2017 1149   K 3.9 10/21/2016 1151   CL 96 (L) 04/01/2018 0907   CL 95 (L) 08/19/2017 1149   CO2 27 04/01/2018 0907   CO2 28 08/19/2017 1149   CO2 25 10/21/2016 1151   BUN 12 04/01/2018 0907   BUN 14 08/19/2017 1149   BUN 13.6 10/21/2016 1151   CREATININE 0.86 04/01/2018 0907   CREATININE 1.0 08/19/2017 1149   CREATININE 0.8 10/21/2016 1151      Component Value Date/Time   CALCIUM 10.0 04/01/2018 0907   CALCIUM 10.1 08/19/2017 1149   CALCIUM 10.3 10/21/2016 1151   ALKPHOS 54 04/01/2018 0907   ALKPHOS 68 08/19/2017 1149   ALKPHOS 68 10/21/2016 1151   AST 22 04/01/2018 0907   AST 25 10/21/2016 1151   ALT 30 04/01/2018 0907   ALT 32 08/19/2017 1149   ALT 38 10/21/2016 1151   BILITOT 1.7 (H) 04/01/2018 0907   BILITOT 1.22 (H) 10/21/2016 1151      Impression and Plan: Jesse [name] is a very pleasant 77 yo caucasian gentleman with polycythemia, JAK-2 negative.   We will go ahead and phlebotomize him today.  I think this will be quite helpful.  We will plan to get him back in another 6 weeks.  I think this will be a good timeframe.  I want to make sure that his blood count is going to be okay for the upcoming holiday season.  Volanda Napoleon, MD 8/22/201910:43 AM

## 2018-05-14 NOTE — Progress Notes (Signed)
Angie Fava presents today for phlebotomy per MD orders. Phlebotomy procedure started at 1047 and ended at 1053 via phlebotomy kit to left ac. 530 grams removed. Patient observed for 30 minutes after procedure without any incident. Patient tolerated procedure well. IV needle removed intact.  Pressure dressing clean, dry and intact to left ac at time of discharge.

## 2018-05-14 NOTE — Patient Instructions (Signed)

## 2018-06-24 ENCOUNTER — Other Ambulatory Visit: Payer: Self-pay | Admitting: Nurse Practitioner

## 2018-06-25 ENCOUNTER — Inpatient Hospital Stay: Payer: Medicare Other

## 2018-06-25 ENCOUNTER — Inpatient Hospital Stay: Payer: Medicare Other | Attending: Family | Admitting: Hematology & Oncology

## 2018-06-25 VITALS — BP 133/78 | HR 85 | Temp 98.7°F | Resp 18 | Wt 151.0 lb

## 2018-06-25 DIAGNOSIS — D45 Polycythemia vera: Secondary | ICD-10-CM

## 2018-06-25 LAB — CBC WITH DIFFERENTIAL (CANCER CENTER ONLY)
Basophils Absolute: 0 10*3/uL (ref 0.0–0.1)
Basophils Relative: 0 %
Eosinophils Absolute: 0 10*3/uL (ref 0.0–0.5)
Eosinophils Relative: 0 %
HCT: 45.3 % (ref 38.7–49.9)
Hemoglobin: 15.1 g/dL (ref 13.0–17.1)
Lymphocytes Relative: 24 %
Lymphs Abs: 1.9 10*3/uL (ref 0.9–3.3)
MCH: 28.9 pg (ref 28.0–33.4)
MCHC: 33.3 g/dL (ref 32.0–35.9)
MCV: 86.8 fL (ref 82.0–98.0)
Monocytes Absolute: 0.9 10*3/uL (ref 0.1–0.9)
Monocytes Relative: 12 %
Neutro Abs: 5 10*3/uL (ref 1.5–6.5)
Neutrophils Relative %: 64 %
Platelet Count: 333 10*3/uL (ref 145–400)
RBC: 5.22 MIL/uL (ref 4.20–5.70)
RDW: 14.7 % (ref 11.1–15.7)
WBC Count: 7.8 10*3/uL (ref 4.0–10.0)

## 2018-06-25 LAB — CMP (CANCER CENTER ONLY)
ALT: 24 U/L (ref 0–44)
AST: 22 U/L (ref 15–41)
Albumin: 3.7 g/dL (ref 3.5–5.0)
Alkaline Phosphatase: 58 U/L (ref 38–126)
Anion gap: 8 (ref 5–15)
BUN: 11 mg/dL (ref 8–23)
CO2: 28 mmol/L (ref 22–32)
Calcium: 10.4 mg/dL — ABNORMAL HIGH (ref 8.9–10.3)
Chloride: 98 mmol/L (ref 98–111)
Creatinine: 0.81 mg/dL (ref 0.61–1.24)
GFR, Est AFR Am: >60 mL/min (ref >60)
GFR, Estimated: >60 mL/min (ref >60)
Glucose, Bld: 93 mg/dL (ref 70–99)
Potassium: 3.7 mmol/L (ref 3.5–5.1)
Sodium: 134 mmol/L — ABNORMAL LOW (ref 135–145)
Total Bilirubin: 1.3 mg/dL — ABNORMAL HIGH (ref 0.3–1.2)
Total Protein: 7.1 g/dL (ref 6.5–8.1)

## 2018-06-25 NOTE — Progress Notes (Signed)
Hematology and Oncology Follow Up Visit  Jesse [name] [id number] 27-Apr-1941 77 y.o. 06/25/2018   Principle Diagnosis:  Polycythemia vera - JAK2 negative  Current Therapy:   Phlebotomy to maintain hematocrit below 45% Aspirin 81 mg by mouth daily   Interim History: Jesse [name] is here today for follow-up.  He is doing okay.  He is really had no problems since we last saw him.  He has not been phlebotomized for several months.  I think his last phlebotomy was probably back in August.  He is still taking care of his sister who has physical and mental disabilities.  He is also working.  He enjoys working.  He has had no change in bowel or bladder habits.  He has had no cough.  He has had no nausea or vomiting.  He has had no rashes.  There has been no leg swelling.  Overall, his performance status is ECOG 1.  MEDICATIONS Allergies as of 06/25/2018   No Known Allergies     Medication List        Accurate as of 06/25/18  1:58 PM. Always use your most recent med list.          ALPRAZolam 0.5 MG tablet Commonly known as:  XANAX Take 0.25 mg by mouth at bedtime as needed. For sleep   aspirin EC 81 MG tablet Take 81 mg by mouth daily.   cholecalciferol 1000 units tablet Commonly known as:  VITAMIN D Take 1,000 Units by mouth daily.   fish oil-omega-3 fatty acids 1000 MG capsule Take 1 g by mouth daily.   lisinopril-hydrochlorothiazide 20-12.5 MG tablet Commonly known as:  PRINZIDE,ZESTORETIC Take 1 tablet by mouth daily.   simvastatin 20 MG tablet Commonly known as:  ZOCOR Take 20 mg by mouth at bedtime.   traZODone 100 MG tablet Commonly known as:  DESYREL Take 100 mg by mouth at bedtime.   vitamin B-12 1000 MCG tablet Commonly known as:  CYANOCOBALAMIN Take 1,000 mcg by mouth once a week.       Allergies: No Known Allergies  Past Medical History, Surgical history, Social history, and Family History were reviewed and updated.  Review of  Systems: Review of Systems  Constitutional: Negative.   HENT: Negative.   Eyes: Negative.   Respiratory: Negative.   Cardiovascular: Negative.   Gastrointestinal: Negative.   Genitourinary: Negative.   Musculoskeletal: Negative.   Skin: Negative.   Neurological: Negative.   Endo/Heme/Allergies: Negative.   Psychiatric/Behavioral: Negative.       Physical Exam:  weight is 151 lb (68.5 kg). His oral temperature is 98.7 F (37.1 C). His blood pressure is 133/78 and his pulse is 85. His respiration is 18 and oxygen saturation is 95%.   Wt Readings from Last 3 Encounters:  06/25/18 151 lb (68.5 kg)  05/14/18 156 lb (70.8 kg)  04/01/18 158 lb 12.8 oz (72 kg)    Physical Exam  Constitutional: He is oriented to person, place, and time.  HENT:  Head: Normocephalic and atraumatic.  Mouth/Throat: Oropharynx is clear and moist.  Eyes: Pupils are equal, round, and reactive to light. EOM are normal.  Neck: Normal range of motion.  Cardiovascular: Normal rate, regular rhythm and normal heart sounds.  Pulmonary/Chest: Effort normal and breath sounds normal.  Abdominal: Soft. Bowel sounds are normal.  Musculoskeletal: Normal range of motion. He exhibits no edema, tenderness or deformity.  Lymphadenopathy:    He has no cervical adenopathy.  Neurological: He is alert and oriented to  person, place, and time.  Skin: Skin is warm and dry. No rash noted. No erythema.  Psychiatric: He has a normal mood and affect. His behavior is normal. Judgment and thought content normal.  Vitals reviewed.    Lab Results  Component Value Date   WBC 7.8 06/25/2018   HGB 15.1 06/25/2018   HCT 45.3 06/25/2018   MCV 86.8 06/25/2018   PLT 333 06/25/2018   Lab Results  Component Value Date   FERRITIN 20 (L) 05/14/2018   IRON 78 05/14/2018   TIBC 390 05/14/2018   UIBC 312 05/14/2018   IRONPCTSAT 20 (L) 05/14/2018   Lab Results  Component Value Date   RETICCTPCT 1.6 01/02/2011   RBC 5.22  06/25/2018   RETICCTABS 87.5 01/02/2011   No results found for: KPAFRELGTCHN, LAMBDASER, KAPLAMBRATIO No results found for: Kandis Cocking, IGMSERUM No results found for: Kathrynn Ducking, MSPIKE, SPEI   Chemistry      Component Value Date/Time   NA 133 (L) 05/14/2018 0953   NA 136 08/19/2017 1149   NA 135 (L) 10/21/2016 1151   K 3.8 05/14/2018 0953   K 3.8 08/19/2017 1149   K 3.9 10/21/2016 1151   CL 98 05/14/2018 0953   CL 95 (L) 08/19/2017 1149   CO2 27 05/14/2018 0953   CO2 28 08/19/2017 1149   CO2 25 10/21/2016 1151   BUN 15 05/14/2018 0953   BUN 14 08/19/2017 1149   BUN 13.6 10/21/2016 1151   CREATININE 0.79 05/14/2018 0953   CREATININE 1.0 08/19/2017 1149   CREATININE 0.8 10/21/2016 1151      Component Value Date/Time   CALCIUM 10.2 05/14/2018 0953   CALCIUM 10.1 08/19/2017 1149   CALCIUM 10.3 10/21/2016 1151   ALKPHOS 54 05/14/2018 0953   ALKPHOS 68 08/19/2017 1149   ALKPHOS 68 10/21/2016 1151   AST 22 05/14/2018 0953   AST 25 10/21/2016 1151   ALT 26 05/14/2018 0953   ALT 32 08/19/2017 1149   ALT 38 10/21/2016 1151   BILITOT 1.5 (H) 05/14/2018 0953   BILITOT 1.22 (H) 10/21/2016 1151      Impression and Plan: Jesse [name] is a very pleasant 77 yo caucasian gentleman with polycythemia, JAK-2 negative.   I really think we can hold off on phlebotomize him today.  His hematocrit is just a tad above 45%.  He does not look like he is symptomatic.  As such, I think we can hold off until closer to Thanksgiving.  I will have him come back in November.  He will have a physical exam by his family doctor in early November.  We will see him after that.  Volanda Napoleon, MD 10/3/20191:58 PM

## 2018-06-26 LAB — IRON AND TIBC
Iron: 46 ug/dL (ref 42–163)
Saturation Ratios: 12 % — ABNORMAL LOW (ref 42–163)
TIBC: 380 ug/dL (ref 202–409)
UIBC: 335 ug/dL

## 2018-06-26 LAB — FERRITIN: Ferritin: 15 ng/mL — ABNORMAL LOW (ref 24–336)

## 2018-06-27 ENCOUNTER — Other Ambulatory Visit: Payer: Self-pay | Admitting: Nurse Practitioner

## 2018-06-29 ENCOUNTER — Other Ambulatory Visit: Payer: Self-pay | Admitting: Nurse Practitioner

## 2018-07-14 ENCOUNTER — Other Ambulatory Visit: Payer: Self-pay | Admitting: Nurse Practitioner

## 2018-08-04 ENCOUNTER — Other Ambulatory Visit: Payer: Self-pay

## 2018-08-04 ENCOUNTER — Encounter: Payer: Self-pay | Admitting: Hematology & Oncology

## 2018-08-04 ENCOUNTER — Inpatient Hospital Stay: Payer: Medicare Other | Attending: Family | Admitting: Hematology & Oncology

## 2018-08-04 ENCOUNTER — Telehealth: Payer: Self-pay | Admitting: Hematology & Oncology

## 2018-08-04 ENCOUNTER — Inpatient Hospital Stay: Payer: Medicare Other

## 2018-08-04 VITALS — BP 114/78 | HR 89 | Temp 98.2°F | Resp 18 | Wt 155.0 lb

## 2018-08-04 DIAGNOSIS — D45 Polycythemia vera: Secondary | ICD-10-CM | POA: Diagnosis not present

## 2018-08-04 DIAGNOSIS — Z23 Encounter for immunization: Secondary | ICD-10-CM | POA: Insufficient documentation

## 2018-08-04 DIAGNOSIS — Z79899 Other long term (current) drug therapy: Secondary | ICD-10-CM | POA: Diagnosis not present

## 2018-08-04 LAB — CMP (CANCER CENTER ONLY)
ALT: 25 U/L (ref 0–44)
AST: 24 U/L (ref 15–41)
Albumin: 3.9 g/dL (ref 3.5–5.0)
Alkaline Phosphatase: 62 U/L (ref 38–126)
Anion gap: 10 (ref 5–15)
BUN: 14 mg/dL (ref 8–23)
CO2: 27 mmol/L (ref 22–32)
Calcium: 10.5 mg/dL — ABNORMAL HIGH (ref 8.9–10.3)
Chloride: 97 mmol/L — ABNORMAL LOW (ref 98–111)
Creatinine: 0.83 mg/dL (ref 0.61–1.24)
GFR, Est AFR Am: >60 mL/min (ref >60)
GFR, Estimated: >60 mL/min (ref >60)
Glucose, Bld: 110 mg/dL — ABNORMAL HIGH (ref 70–99)
Potassium: 3.9 mmol/L (ref 3.5–5.1)
Sodium: 134 mmol/L — ABNORMAL LOW (ref 135–145)
Total Bilirubin: 1.4 mg/dL — ABNORMAL HIGH (ref 0.3–1.2)
Total Protein: 7.3 g/dL (ref 6.5–8.1)

## 2018-08-04 LAB — CBC WITH DIFFERENTIAL (CANCER CENTER ONLY)
Abs Immature Granulocytes: 0.04 10*3/uL (ref 0.00–0.07)
Basophils Absolute: 0 10*3/uL (ref 0.0–0.1)
Basophils Relative: 0 %
Eosinophils Absolute: 0 10*3/uL (ref 0.0–0.5)
Eosinophils Relative: 0 %
HCT: 52 % (ref 39.0–52.0)
Hemoglobin: 16.3 g/dL (ref 13.0–17.0)
Immature Granulocytes: 0 %
Lymphocytes Relative: 20 %
Lymphs Abs: 1.8 10*3/uL (ref 0.7–4.0)
MCH: 28.1 pg (ref 26.0–34.0)
MCHC: 31.3 g/dL (ref 30.0–36.0)
MCV: 89.7 fL (ref 80.0–100.0)
Monocytes Absolute: 1.1 10*3/uL — ABNORMAL HIGH (ref 0.1–1.0)
Monocytes Relative: 12 %
Neutro Abs: 6.3 10*3/uL (ref 1.7–7.7)
Neutrophils Relative %: 68 %
Platelet Count: 287 10*3/uL (ref 150–400)
RBC: 5.8 MIL/uL (ref 4.22–5.81)
RDW: 15.5 % (ref 11.5–15.5)
WBC Count: 9.3 10*3/uL (ref 4.0–10.5)
nRBC: 0 % (ref 0.0–0.2)

## 2018-08-04 NOTE — Telephone Encounter (Signed)
Appointments scheduled patient to get updated schedule at next visit.  I was unable to get late morning appointment for next visit due to Dr Antonieta Pert schedule being booked/ per 11/12 los

## 2018-08-04 NOTE — Progress Notes (Signed)
Hematology and Oncology Follow Up Visit  Jesse [name] [id number] 1941/02/05 77 y.o. 08/04/2018   Principle Diagnosis:  Polycythemia vera - JAK2 negative  Current Therapy:   Phlebotomy to maintain hematocrit below 45% Aspirin 81 mg by mouth daily   Interim History: Jesse [name] is here today for follow-up.  He is doing okay.  He is really had no problems since we last saw him.  He has not been phlebotomized for several months.  Jesse last phlebotomy was probably back in August.  He is still taking care of Jesse [name] who has physical and mental disabilities.  He is retired from work.  He worked for over 32 years.  He enjoys Jesse retirement.    Jesse last iron studies back in October showed a ferritin of 15 with iron saturation of 12%.  He has had no change in bowel or bladder habits.  He has had no cough.  He has had no nausea or vomiting.  He has had no rashes.  There has been no leg swelling.  Overall, Jesse performance status is ECOG 1.  MEDICATIONS Allergies as of 08/04/2018   No Known Allergies     Medication List        Accurate as of 08/04/18 11:37 AM. Always use your most recent med list.          ALPRAZolam 1 MG tablet Commonly known as:  XANAX Take 1 mg by mouth as needed. Take half tablet, a total of 0.5 mg at night as needed for sleep.   aspirin EC 81 MG tablet Take 81 mg by mouth daily.   cholecalciferol 1000 units tablet Commonly known as:  VITAMIN D Take 1,000 Units by mouth daily.   fish oil-omega-3 fatty acids 1000 MG capsule Take 1 g by mouth daily.   lisinopril-hydrochlorothiazide 20-12.5 MG tablet Commonly known as:  PRINZIDE,ZESTORETIC Take 1 tablet by mouth daily.   simvastatin 20 MG tablet Commonly known as:  ZOCOR Take 20 mg by mouth at bedtime.   traZODone 100 MG tablet Commonly known as:  DESYREL Take 100 mg by mouth at bedtime.   vitamin B-12 1000 MCG tablet Commonly known as:  CYANOCOBALAMIN Take 1,000 mcg by mouth once a week.       Allergies: No Known Allergies  Past Medical History, Surgical history, Social history, and Family History were reviewed and updated.  Review of Systems: Review of Systems  Constitutional: Negative.   HENT: Negative.   Eyes: Negative.   Respiratory: Negative.   Cardiovascular: Negative.   Gastrointestinal: Negative.   Genitourinary: Negative.   Musculoskeletal: Negative.   Skin: Negative.   Neurological: Negative.   Endo/Heme/Allergies: Negative.   Psychiatric/Behavioral: Negative.       Physical Exam:  weight is 155 lb (70.3 kg). Jesse oral temperature is 98.2 F (36.8 C). Jesse blood pressure is 114/78 and Jesse pulse is 89. Jesse respiration is 18 and oxygen saturation is 99%.   Wt Readings from Last 3 Encounters:  08/04/18 155 lb (70.3 kg)  06/25/18 151 lb (68.5 kg)  05/14/18 156 lb (70.8 kg)    Physical Exam  Constitutional: He is oriented to person, place, and time.  HENT:  Head: Normocephalic and atraumatic.  Mouth/Throat: Oropharynx is clear and moist.  Eyes: Pupils are equal, round, and reactive to light. EOM are normal.  Neck: Normal range of motion.  Cardiovascular: Normal rate, regular rhythm and normal heart sounds.  Pulmonary/Chest: Effort normal and breath sounds normal.  Abdominal: Soft. Bowel sounds  are normal.  Musculoskeletal: Normal range of motion. He exhibits no edema, tenderness or deformity.  Lymphadenopathy:    He has no cervical adenopathy.  Neurological: He is alert and oriented to person, place, and time.  Skin: Skin is warm and dry. No rash noted. No erythema.  Psychiatric: He has a normal mood and affect. Jesse behavior is normal. Judgment and thought content normal.  Vitals reviewed.    Lab Results  Component Value Date   WBC 9.3 08/04/2018   HGB 16.3 08/04/2018   HCT 52.0 08/04/2018   MCV 89.7 08/04/2018   PLT 287 08/04/2018   Lab Results  Component Value Date   FERRITIN 15 (L) 06/25/2018   IRON 46 06/25/2018   TIBC 380  06/25/2018   UIBC 335 06/25/2018   IRONPCTSAT 12 (L) 06/25/2018   Lab Results  Component Value Date   RETICCTPCT 1.6 01/02/2011   RBC 5.80 08/04/2018   RETICCTABS 87.5 01/02/2011   No results found for: KPAFRELGTCHN, LAMBDASER, KAPLAMBRATIO No results found for: IGGSERUM, IGA, IGMSERUM No results found for: Odetta Pink, SPEI   Chemistry      Component Value Date/Time   NA 134 (L) 06/25/2018 1257   NA 136 08/19/2017 1149   NA 135 (L) 10/21/2016 1151   K 3.7 06/25/2018 1257   K 3.8 08/19/2017 1149   K 3.9 10/21/2016 1151   CL 98 06/25/2018 1257   CL 95 (L) 08/19/2017 1149   CO2 28 06/25/2018 1257   CO2 28 08/19/2017 1149   CO2 25 10/21/2016 1151   BUN 11 06/25/2018 1257   BUN 14 08/19/2017 1149   BUN 13.6 10/21/2016 1151   CREATININE 0.81 06/25/2018 1257   CREATININE 1.0 08/19/2017 1149   CREATININE 0.8 10/21/2016 1151      Component Value Date/Time   CALCIUM 10.4 (H) 06/25/2018 1257   CALCIUM 10.1 08/19/2017 1149   CALCIUM 10.3 10/21/2016 1151   ALKPHOS 58 06/25/2018 1257   ALKPHOS 68 08/19/2017 1149   ALKPHOS 68 10/21/2016 1151   AST 22 06/25/2018 1257   AST 25 10/21/2016 1151   ALT 24 06/25/2018 1257   ALT 32 08/19/2017 1149   ALT 38 10/21/2016 1151   BILITOT 1.3 (H) 06/25/2018 1257   BILITOT 1.22 (H) 10/21/2016 1151      Impression and Plan: Jesse [name] is a very pleasant 77 yo caucasian gentleman with polycythemia, JAK-2 negative.   We will phlebotomize him today.  He clearly needs to have this done as Jesse hematocrit went up quite a bit.  I will plan to see him back in about 4 weeks or so.  I want to make sure that we are aggressive and get Jesse hematocrit back to normal.   Volanda Napoleon, MD 11/12/201911:37 AM

## 2018-08-05 ENCOUNTER — Inpatient Hospital Stay: Payer: Medicare Other

## 2018-08-05 VITALS — BP 120/72 | HR 99 | Temp 98.0°F | Resp 18

## 2018-08-05 DIAGNOSIS — Z23 Encounter for immunization: Secondary | ICD-10-CM

## 2018-08-05 DIAGNOSIS — D45 Polycythemia vera: Secondary | ICD-10-CM | POA: Diagnosis not present

## 2018-08-05 LAB — IRON AND TIBC
Iron: 103 ug/dL (ref 42–163)
Saturation Ratios: 26 % (ref 20–55)
TIBC: 401 ug/dL (ref 202–409)
UIBC: 298 ug/dL (ref 117–376)

## 2018-08-05 LAB — FERRITIN: Ferritin: 15 ng/mL — ABNORMAL LOW (ref 24–336)

## 2018-08-05 MED ORDER — INFLUENZA VAC SPLIT QUAD 0.5 ML IM SUSY
0.5000 mL | PREFILLED_SYRINGE | Freq: Once | INTRAMUSCULAR | Status: AC
  Administered 2018-08-05: 0.5 mL via INTRAMUSCULAR

## 2018-08-05 NOTE — Progress Notes (Signed)
Angie Fava presents today for phlebotomy per MD orders. Phlebotomy procedure started at 1058 and ended at 1108. 515 cc removed via 16 G needle at L antecubital site. Patient tolerated procedure well. Pt. requested flu shot, ok to give per Dr. Marin Olp .

## 2018-08-05 NOTE — Patient Instructions (Signed)
Therapeutic Phlebotomy, Care After Refer to this sheet in the next few weeks. These instructions provide you with information about caring for yourself after your procedure. Your health care provider may also give you more specific instructions. Your treatment has been planned according to current medical practices, but problems sometimes occur. Call your health care provider if you have any problems or questions after your procedure. What can I expect after the procedure? After the procedure, it is common to have:  Light-headedness or dizziness. You may feel faint.  Nausea.  Tiredness.  Follow these instructions at home: Activity  Return to your normal activities as directed by your health care provider. Most people can go back to their normal activities right away.  Avoid strenuous physical activity and heavy lifting or pulling for about 5 hours after the procedure. Do not lift anything that is heavier than 10 lb (4.5 kg).  Athletes should avoid strenuous exercise for at least 12 hours.  Change positions slowly for the remainder of the day. This will help to prevent light-headedness or fainting.  If you feel light-headed, lie down until the feeling goes away. Eating and drinking  Be sure to eat well-balanced meals for the next 24 hours.  Drink enough fluid to keep your urine clear or pale yellow.  Avoid drinking alcohol on the day that you had the procedure. Care of the Needle Insertion Site  Keep your bandage dry. You can remove the bandage after about 5 hours or as directed by your health care provider.  If you have bleeding from the needle insertion site, elevate your arm and press firmly on the site until the bleeding stops.  If you have bruising at the site, apply ice to the area: ? Put ice in a plastic bag. ? Place a towel between your skin and the bag. ? Leave the ice on for 20 minutes, 2-3 times a day for the first 24 hours.  If the swelling does not go away after  24 hours, apply a warm, moist washcloth to the area for 20 minutes, 2-3 times a day. General instructions  Avoid smoking for at least 30 minutes after the procedure.  Keep all follow-up visits as directed by your health care provider. It is important to continue with further therapeutic phlebotomy treatments as directed. Contact a health care provider if:  You have redness, swelling, or pain at the needle insertion site.  You have fluid, blood, or pus coming from the needle insertion site.  You feel light-headed, dizzy, or nauseated, and the feeling does not go away.  You notice new bruising at the needle insertion site.  You feel weaker than normal.  You have a fever or chills. Get help right away if:  You have severe nausea or vomiting.  You have chest pain.  You have trouble breathing. This information is not intended to replace advice given to you by your health care provider. Make sure you discuss any questions you have with your health care provider. Document Released: 02/11/2011 Document Revised: 05/11/2016 Document Reviewed: 09/05/2014 Elsevier Interactive Patient Education  2018 [employer]. Immunization Schedule, Adult Recommended immunizations These include:  Influenza vaccine. ? All adults should be immunized every year. ? All adults, including pregnant women and people with hives-only allergy to eggs can receive the inactivated influenza vaccine (IIV) or recombinant influenza vaccine (RIV). ? Adults aged 18-64 years can receive the IIV or RIV. The RIV vaccine does not contain any egg protein. ? Adults aged 32 years or  older can receive the high-dose or adjuvanted IIV.  Tetanus, diphtheria, and acellular pertussis (Td, Tdap) vaccine. ? Pregnant women should receive 1 dose of Tdap vaccine during each pregnancy. The dose should be obtained regardless of the length of time since the last dose. Immunization is preferred during the 27th to 36th week of  gestation. ? An adult who has not previously received Tdap or who does not know his or her vaccine status should receive 1 dose of Tdap. This initial dose should be followed by tetanus and diphtheria toxoids (Td) booster doses every 10 years. ? Adults with an unknown or incomplete history of completing a 3-dose immunization series with Td-containing vaccines should begin or complete a primary immunization series including a Tdap dose. The first 2 doses should be received at least 4 weeks apart, and the third dose 6-12 months after the second dose. ? Adults should receive a Td booster every 10 years.  Varicella vaccine. ? An adult without evidence of immunity to varicella should receive 2 doses 4-8 weeks apart, or a second dose if he or she has previously received 1 dose. ? Pregnant females who do not have evidence of immunity should receive the first dose after pregnancy. This first dose should be obtained before leaving the health care facility. The second dose should be obtained 4-8 weeks after the first dose. ? All healthcare workers should have evidence of immunity to varicella. ? Adults with cancer or those who are on therapy to suppress the immune system should not receive the varicella vaccine.  Human papillomavirus (HPV) vaccine. ? Females aged 13-26 years who have not received the vaccine previously should obtain the 3-dose series. Females should receive the second dose 1-2 months after the first dose, and the third dose 6 months after the first dose. ? The vaccine is not recommended for use in pregnant females. However, pregnancy testing is not needed before receiving a dose. If a male is found to be pregnant after receiving a dose, no treatment is needed. In that case, the remaining doses should be delayed until after the pregnancy. ? Males aged 5-21 years who have not received the vaccine previously should receive the 3-dose series. Males aged 22-26 years may also receive a 3 dose  series. Males should receive the second dose 1-2 months after the first dose, and the third dose 6 months after the first dose. ? Adult females up to age 31 years and adult males up to age 50 years who initiated the HPV vaccine series before age 30 years and received 2 doses at least 5 months apart do not need an additional dose of the vaccine. ? Adult females up to age 64 years and adult male up to age 64 years who initiated the HPV vaccine series before 15 years but only received 1 dose or 2 doses less than 5 months apart should receive 1 additional dose of the vaccine. ? Immunization is recommended through the age of 90 years for any male who has sex with males and did not get any or all doses earlier. ? Immunization is recommended for any person with an immunocompromised condition through the age of 45 years if he or she did not get any or all doses earlier.  Zoster vaccine. ? One dose is recommended for adults aged 31 years or older unless certain conditions are present.  Measles, mumps, and rubella (MMR) vaccine. ? Adults born before 75 generally are considered immune to measles and mumps. ? Adults born in  1957 or later should have 1 or more doses of MMR vaccine unless there is a contraindication to the vaccine or there is laboratory evidence of immunity to each of the three diseases. ? A routine second dose of MMR vaccine should be obtained at least 28 days after the first dose for students attending postsecondary schools, health care workers, or international travelers. ? People who received inactivated measles vaccine or an unknown type of measles vaccine during 1963-1967 should be revaccinated with 1 or 2 doses of MMR vaccine. ? People who received inactivated mumps vaccine or an unknown type of mumps vaccine before 1979 and are at high risk for mumps infection should consider immunization with 2 doses of MMR vaccine. ? For females of childbearing age, rubella immunity should be  determined. If there is no evidence of immunity, females who are not pregnant should receive 1 dose of MMR. If there is no evidence of immunity, females who are pregnant should receive 1 dose of MMR after pregnancy and before leaving the healthcare facility. ? Unvaccinated health care workers born before [birth year] who lack laboratory evidence of measles, mumps, or rubella immunity or laboratory confirmation of disease should consider 2 doses of MMR 18 days apart for measles or mumps, or 1 dose of MMR for rubella.  Pneumococcal vaccines. ? All adults aged 59 years and older should receive 13-valent pneumococcal conjugate vaccine (PCV13) followed by 23-valent pneumococcal polyscaccharide vaccine (PPSV23) at least 1 year after PCV13. ? An adult aged 82 years or older who has certain medical conditions and has not been previously immunized should receive 1 dose of PCV13 vaccine. This PCV13 should be followed with a dose of pneumococcal polysaccharide (PPSV23) vaccine. The PPSV23 vaccine dose should be obtained at least 8 weeks after the dose of PCV13 vaccine. ? An adult aged 74 years or older who has certain medical conditions and previously received 1 or more doses of PPSV23 vaccine should receive 1 dose of PCV13. The PCV13 vaccine dose should be obtained 1 or more years after the last PPSV23 vaccine dose. ? PPSV23 vaccination should happen in all adults aged 19-64 who smoke cigarettes. ? People with an immunocompromised condition and certain other conditions should receive both PCV13 and PPSV23 vaccines. ? When indicated, people who have unknown immunization and have no record of immunization should receive PPSV23 vaccine. ? People who received 1-2 doses of PPSV23 before age 62 years should receive another dose of PPSV23 vaccine at age 90 years or later if at least 5 years have passed since the previous dose. ? Doses of PPSV23 are not needed for people immunized with PPSV23 at or after age 39  years.  Meningococcal vaccine. ? Adults with asplenia or persistent complement component deficiencies should receive 2 doses of quadrivalent meningococcal conjugate (MenACWY-D) vaccine. The doses should be obtained at least 2 months apart. Revaccination should occur every 5 years. A 2-dose or 3-dose series of serogroup B meningococcal (MenB)vaccine should also be obtained. ? Microbiologists working with certain meningococcal bacteria, [employer] recruits, people at risk during an outbreak, and people who travel to or live in countries with a high rate of meningitis should be immunized. Revaccination is recommended every 5 years if the risk for infection remains. ? Adults with HIV infection who have not been previously vaccinated should receive a 2-dose MenACWY series with doses at least 2 months apart. If 1 dose was received previously, a second dose should be obtained at least 2 months later. Revaccination is recommended every 5  years. ? A first-year college student up through age 66 years who is living in a residence hall should receive a dose if he or she did not receive a dose on or after his or her 16th birthday. ? Adults aged 15-23 years may receive 2 doses of MenB vaccine for short-term protection against most strains of serogroup B meningococcal disease.  Hepatitis A vaccine. ? Adults who wish to be protected from this disease, have certain high-risk conditions, work with hepatitis A-infected animals, work in hepatitis A research labs, or travel to or work in countries with a high rate of hepatitis A should be immunized. ? Adults who were previously unvaccinated and who anticipate close contact with an international adoptee during the first 60 days after arrival in the [place] from a country with a high rate of hepatitis A should be immunized. ? The vaccine may be given as a 2 or 3-dose series by itself or in combination with the hepatitis B vaccine (HepB).  Hepatitis B vaccine. ? Adults  who wish to be protected from this disease, may be exposed to blood or other infectious body fluids, are household contacts or sex partners of hepatitis B positive people, are clients or workers in certain care facilities, or travel to or work in countries with a high rate of hepatitis B should be immunized. ? Adults with chronic liver disease, such as hepatitis C infection, cirrhosis, fatty liver disease, alcoholic liver disease, autoimmune hepatitis, and elevated liver chemistry levels should be vaccinated. ? Pregnant women who are at risk for hepatitis B virus infection during pregnancy should be immunized. ? The vaccine may be given as a 3-dose series by itself or in combination with the hepatitis A vaccine (HepA).  Haemophilus influenzae type b (Hib) vaccine. ? A previously unvaccinated person with asplenia or sickle cell disease or having a scheduled splenectomy should receive 1 dose of Hib vaccine. This should happen at least 14 days before the procedure. ? Regardless of previous immunization, a recipient of a hematopoietic stem cell transplant should receive a 3-dose series, with at least 4 weeks between doses, 6-12 months after his or her successful transplant. ? Hib vaccine is not recommended for adults with HIV infection.  This information is not intended to replace advice given to you by your health care provider. Make sure you discuss any questions you have with your health care provider. Document Released: 11/30/2003 Document Revised: 05/22/2016 Document Reviewed: 05/22/2016 Elsevier Interactive Patient Education  2018 [employer].

## 2018-09-10 ENCOUNTER — Inpatient Hospital Stay (HOSPITAL_BASED_OUTPATIENT_CLINIC_OR_DEPARTMENT_OTHER): Payer: Medicare Other | Admitting: Hematology & Oncology

## 2018-09-10 ENCOUNTER — Other Ambulatory Visit: Payer: Self-pay

## 2018-09-10 ENCOUNTER — Inpatient Hospital Stay: Payer: Medicare Other | Attending: Family

## 2018-09-10 ENCOUNTER — Encounter: Payer: Self-pay | Admitting: Hematology & Oncology

## 2018-09-10 ENCOUNTER — Inpatient Hospital Stay: Payer: Medicare Other

## 2018-09-10 VITALS — BP 127/72 | HR 88 | Temp 97.6°F | Resp 18 | Wt 156.0 lb

## 2018-09-10 DIAGNOSIS — D45 Polycythemia vera: Secondary | ICD-10-CM | POA: Insufficient documentation

## 2018-09-10 DIAGNOSIS — Z7982 Long term (current) use of aspirin: Secondary | ICD-10-CM | POA: Diagnosis not present

## 2018-09-10 LAB — CMP (CANCER CENTER ONLY)
ALT: 23 U/L (ref 0–44)
AST: 17 U/L (ref 15–41)
Albumin: 4.1 g/dL (ref 3.5–5.0)
Alkaline Phosphatase: 57 U/L (ref 38–126)
Anion gap: 7 (ref 5–15)
BUN: 20 mg/dL (ref 8–23)
CO2: 30 mmol/L (ref 22–32)
Calcium: 10.2 mg/dL (ref 8.9–10.3)
Chloride: 97 mmol/L — ABNORMAL LOW (ref 98–111)
Creatinine: 0.98 mg/dL (ref 0.61–1.24)
GFR, Est AFR Am: >60 mL/min (ref >60)
GFR, Estimated: >60 mL/min (ref >60)
Glucose, Bld: 89 mg/dL (ref 70–99)
Potassium: 3.7 mmol/L (ref 3.5–5.1)
Sodium: 134 mmol/L — ABNORMAL LOW (ref 135–145)
Total Bilirubin: 1.2 mg/dL (ref 0.3–1.2)
Total Protein: 6.5 g/dL (ref 6.5–8.1)

## 2018-09-10 LAB — CBC WITH DIFFERENTIAL (CANCER CENTER ONLY)
Abs Immature Granulocytes: 0.03 10*3/uL (ref 0.00–0.07)
Basophils Absolute: 0 10*3/uL (ref 0.0–0.1)
Basophils Relative: 1 %
Eosinophils Absolute: 0.1 10*3/uL (ref 0.0–0.5)
Eosinophils Relative: 1 %
HCT: 48.8 % (ref 39.0–52.0)
Hemoglobin: 15.4 g/dL (ref 13.0–17.0)
Immature Granulocytes: 0 %
Lymphocytes Relative: 34 %
Lymphs Abs: 2.3 10*3/uL (ref 0.7–4.0)
MCH: 28 pg (ref 26.0–34.0)
MCHC: 31.6 g/dL (ref 30.0–36.0)
MCV: 88.7 fL (ref 80.0–100.0)
Monocytes Absolute: 1 10*3/uL (ref 0.1–1.0)
Monocytes Relative: 15 %
Neutro Abs: 3.4 10*3/uL (ref 1.7–7.7)
Neutrophils Relative %: 49 %
Platelet Count: 291 10*3/uL (ref 150–400)
RBC: 5.5 MIL/uL (ref 4.22–5.81)
RDW: 15.4 % (ref 11.5–15.5)
WBC Count: 6.8 10*3/uL (ref 4.0–10.5)
nRBC: 0 % (ref 0.0–0.2)

## 2018-09-10 LAB — IRON AND TIBC
Iron: 62 ug/dL (ref 42–163)
Saturation Ratios: 16 % — ABNORMAL LOW (ref 20–55)
TIBC: 394 ug/dL (ref 202–409)
UIBC: 332 ug/dL (ref 117–376)

## 2018-09-10 LAB — LACTATE DEHYDROGENASE: LDH: 118 U/L (ref 98–192)

## 2018-09-10 LAB — FERRITIN: Ferritin: 15 ng/mL — ABNORMAL LOW (ref 24–336)

## 2018-09-10 NOTE — Patient Instructions (Signed)

## 2018-09-10 NOTE — Progress Notes (Signed)
Hematology and Oncology Follow Up Visit  Jesse [name] [id number] 05/31/1941 77 y.o. 09/10/2018   Principle Diagnosis:  Polycythemia vera - JAK2 negative  Current Therapy:   Phlebotomy to maintain hematocrit below 45% Aspirin 81 mg by mouth daily   Interim History: Jesse [name] is here today for follow-up.  He is doing okay.  He is really had no problems since we last saw him.  He had a very nice Thanksgiving 3 weeks ago.  He was with family.  He has had no problems.  He has had no problems with headache.  He has had no cough.  He has had no nausea or vomiting.  There is been no change in bowel or bladder habits.  He has had no leg swelling.  He has had no rashes.  Overall, his performance status is ECOG 1.  MEDICATIONS  Allergies: No Known Allergies  Past Medical History, Surgical history, Social history, and Family History were reviewed and updated.  Review of Systems: Review of Systems  Constitutional: Negative.   HENT: Negative.   Eyes: Negative.   Respiratory: Negative.   Cardiovascular: Negative.   Gastrointestinal: Negative.   Genitourinary: Negative.   Musculoskeletal: Negative.   Skin: Negative.   Neurological: Negative.   Endo/Heme/Allergies: Negative.   Psychiatric/Behavioral: Negative.       Physical Exam:  weight is 156 lb (70.8 kg). His oral temperature is 97.6 F (36.4 C). His blood pressure is 127/72 and his pulse is 88. His respiration is 18 and oxygen saturation is 100%.   Wt Readings from Last 3 Encounters:  09/10/18 156 lb (70.8 kg)  08/04/18 155 lb (70.3 kg)  06/25/18 151 lb (68.5 kg)    Physical Exam Vitals signs reviewed.  HENT:     Head: Normocephalic and atraumatic.  Eyes:     Pupils: Pupils are equal, round, and reactive to light.  Neck:     Musculoskeletal: Normal range of motion.  Cardiovascular:     Rate and Rhythm: Normal rate and regular rhythm.     Heart sounds: Normal heart sounds.  Pulmonary:     Effort: Pulmonary  effort is normal.     Breath sounds: Normal breath sounds.  Abdominal:     General: Bowel sounds are normal.     Palpations: Abdomen is soft.  Musculoskeletal: Normal range of motion.        General: No tenderness or deformity.  Lymphadenopathy:     Cervical: No cervical adenopathy.  Skin:    General: Skin is warm and dry.     Findings: No erythema or rash.  Neurological:     Mental Status: He is alert and oriented to person, place, and time.  Psychiatric:        Behavior: Behavior normal.        Thought Content: Thought content normal.        Judgment: Judgment normal.      Lab Results  Component Value Date   WBC 6.8 09/10/2018   HGB 15.4 09/10/2018   HCT 48.8 09/10/2018   MCV 88.7 09/10/2018   PLT 291 09/10/2018   Lab Results  Component Value Date   FERRITIN 15 (L) 08/04/2018   IRON 103 08/04/2018   TIBC 401 08/04/2018   UIBC 298 08/04/2018   IRONPCTSAT 26 08/04/2018   Lab Results  Component Value Date   RETICCTPCT 1.6 01/02/2011   RBC 5.50 09/10/2018   RETICCTABS 87.5 01/02/2011   No results found for: KPAFRELGTCHN, LAMBDASER, KAPLAMBRATIO No results  found for: Kandis Cocking, IGMSERUM No results found for: Kathrynn Ducking, MSPIKE, SPEI   Chemistry      Component Value Date/Time   NA 134 (L) 08/04/2018 1049   NA 136 08/19/2017 1149   NA 135 (L) 10/21/2016 1151   K 3.9 08/04/2018 1049   K 3.8 08/19/2017 1149   K 3.9 10/21/2016 1151   CL 97 (L) 08/04/2018 1049   CL 95 (L) 08/19/2017 1149   CO2 27 08/04/2018 1049   CO2 28 08/19/2017 1149   CO2 25 10/21/2016 1151   BUN 14 08/04/2018 1049   BUN 14 08/19/2017 1149   BUN 13.6 10/21/2016 1151   CREATININE 0.83 08/04/2018 1049   CREATININE 1.0 08/19/2017 1149   CREATININE 0.8 10/21/2016 1151      Component Value Date/Time   CALCIUM 10.5 (H) 08/04/2018 1049   CALCIUM 10.1 08/19/2017 1149   CALCIUM 10.3 10/21/2016 1151   ALKPHOS 62 08/04/2018 1049   ALKPHOS 68  08/19/2017 1149   ALKPHOS 68 10/21/2016 1151   AST 24 08/04/2018 1049   AST 25 10/21/2016 1151   ALT 25 08/04/2018 1049   ALT 32 08/19/2017 1149   ALT 38 10/21/2016 1151   BILITOT 1.4 (H) 08/04/2018 1049   BILITOT 1.22 (H) 10/21/2016 1151      Impression and Plan: Jesse [name] is a very pleasant 77 yo caucasian gentleman with polycythemia, JAK-2 negative.   We will phlebotomize him today.  He clearly needs to have this done as his hematocrit went up quite a bit.  I think we can now get him back in the springtime.  I think if we phlebotomize him today, we will get his blood count down enough that we can wait a couple months or so.  I know he will be happy about this.   Volanda Napoleon, MD 12/19/20199:05 AM

## 2018-09-10 NOTE — Progress Notes (Signed)
Jesse [name] presents today for phlebotomy per MD orders. Phlebotomy procedure started at 0922 and ended at 0930 via phlebotomy kit, 200 grams removed before site clotted to right ac. Pressure dressing applied.  Second stick via 20 g angio cath to left ac per A. Shakelton RN with 325 grams removed from 6886 to 0944.  Pressure dressing applied.  Patient observed for 30 minutes after procedure without any incident.  Snack and drink taken.  Patient tolerated procedure well. IV needle removed intact.

## 2018-11-19 ENCOUNTER — Other Ambulatory Visit: Payer: Self-pay

## 2018-11-19 ENCOUNTER — Inpatient Hospital Stay: Payer: Medicare Other

## 2018-11-19 ENCOUNTER — Encounter: Payer: Self-pay | Admitting: Hematology & Oncology

## 2018-11-19 ENCOUNTER — Inpatient Hospital Stay: Payer: Medicare Other | Attending: Family | Admitting: Hematology & Oncology

## 2018-11-19 VITALS — BP 121/80 | HR 84 | Resp 18

## 2018-11-19 VITALS — BP 129/76 | HR 81 | Temp 97.7°F | Resp 17 | Wt 161.5 lb

## 2018-11-19 DIAGNOSIS — E611 Iron deficiency: Secondary | ICD-10-CM | POA: Diagnosis not present

## 2018-11-19 DIAGNOSIS — D45 Polycythemia vera: Secondary | ICD-10-CM

## 2018-11-19 LAB — CBC WITH DIFFERENTIAL (CANCER CENTER ONLY)
Abs Immature Granulocytes: 0.02 10*3/uL (ref 0.00–0.07)
Basophils Absolute: 0 10*3/uL (ref 0.0–0.1)
Basophils Relative: 0 %
Eosinophils Absolute: 0.1 10*3/uL (ref 0.0–0.5)
Eosinophils Relative: 2 %
HCT: 48.2 % (ref 39.0–52.0)
Hemoglobin: 15.3 g/dL (ref 13.0–17.0)
Immature Granulocytes: 0 %
Lymphocytes Relative: 30 %
Lymphs Abs: 2.1 10*3/uL (ref 0.7–4.0)
MCH: 28.1 pg (ref 26.0–34.0)
MCHC: 31.7 g/dL (ref 30.0–36.0)
MCV: 88.6 fL (ref 80.0–100.0)
Monocytes Absolute: 1.2 10*3/uL — ABNORMAL HIGH (ref 0.1–1.0)
Monocytes Relative: 16 %
Neutro Abs: 3.7 10*3/uL (ref 1.7–7.7)
Neutrophils Relative %: 52 %
Platelet Count: 270 10*3/uL (ref 150–400)
RBC: 5.44 MIL/uL (ref 4.22–5.81)
RDW: 15.5 % (ref 11.5–15.5)
WBC Count: 7.2 10*3/uL (ref 4.0–10.5)
nRBC: 0 % (ref 0.0–0.2)

## 2018-11-19 LAB — IRON AND TIBC
Iron: 72 ug/dL (ref 42–163)
Saturation Ratios: 18 % — ABNORMAL LOW (ref 20–55)
TIBC: 396 ug/dL (ref 202–409)
UIBC: 325 ug/dL (ref 117–376)

## 2018-11-19 LAB — CMP (CANCER CENTER ONLY)
ALT: 27 U/L (ref 0–44)
AST: 21 U/L (ref 15–41)
Albumin: 4.3 g/dL (ref 3.5–5.0)
Alkaline Phosphatase: 54 U/L (ref 38–126)
Anion gap: 7 (ref 5–15)
BUN: 17 mg/dL (ref 8–23)
CO2: 28 mmol/L (ref 22–32)
Calcium: 10.1 mg/dL (ref 8.9–10.3)
Chloride: 99 mmol/L (ref 98–111)
Creatinine: 0.84 mg/dL (ref 0.61–1.24)
GFR, Est AFR Am: >60 mL/min (ref >60)
GFR, Estimated: >60 mL/min (ref >60)
Glucose, Bld: 109 mg/dL — ABNORMAL HIGH (ref 70–99)
Potassium: 3.8 mmol/L (ref 3.5–5.1)
Sodium: 134 mmol/L — ABNORMAL LOW (ref 135–145)
Total Bilirubin: 1.3 mg/dL — ABNORMAL HIGH (ref 0.3–1.2)
Total Protein: 7.2 g/dL (ref 6.5–8.1)

## 2018-11-19 LAB — FERRITIN: Ferritin: 14 ng/mL — ABNORMAL LOW (ref 24–336)

## 2018-11-19 NOTE — Patient Instructions (Signed)

## 2018-11-19 NOTE — Progress Notes (Signed)
Angie Fava presents today for phlebotomy per MD orders. Phlebotomy procedure started at 1056 and ended at 1104. 500 grams removed. Patient observed for 30 minutes after procedure without any incident. Patient tolerated procedure well. IV needle removed intact.

## 2018-11-19 NOTE — Progress Notes (Signed)
Hematology and Oncology Follow Up Visit  JANMICHAEL GIRAUD [id number] 02-17-1941 78 y.o. 11/19/2018   Principle Diagnosis:  Polycythemia vera - JAK2 negative  Current Therapy:   Phlebotomy to maintain hematocrit below 45% Aspirin 81 mg by mouth daily   Interim History: Mr. Rill is here today for follow-up.  So far, everything is going pretty well for Mr. Koudelka.  The one problem that he has is that he gets charged over $100 every time he walks into our building.  Because that, I will make sure that his a visits are not too often.  He was last phlebotomized back in December.  He has had no problems so far this winter.  He has had no fever.  He has had no cough..  He has had cataract surgery for both eyes.  This was done back in the fall 2019.  He has had no nausea or vomiting.  He has had no rashes.  Overall, his performance status is ECOG 1.  MEDICATIONS  Allergies: No Known Allergies  Past Medical History, Surgical history, Social history, and Family History were reviewed and updated.  Review of Systems: Review of Systems  Constitutional: Negative.   HENT: Negative.   Eyes: Negative.   Respiratory: Negative.   Cardiovascular: Negative.   Gastrointestinal: Negative.   Genitourinary: Negative.   Musculoskeletal: Negative.   Skin: Negative.   Neurological: Negative.   Endo/Heme/Allergies: Negative.   Psychiatric/Behavioral: Negative.       Physical Exam:  weight is 161 lb 8 oz (73.3 kg). His oral temperature is 97.7 F (36.5 C). His blood pressure is 129/76 and his pulse is 81. His respiration is 17 and oxygen saturation is 98%.   Wt Readings from Last 3 Encounters:  11/19/18 161 lb 8 oz (73.3 kg)  09/10/18 156 lb (70.8 kg)  08/04/18 155 lb (70.3 kg)    Physical Exam Vitals signs reviewed.  HENT:     Head: Normocephalic and atraumatic.  Eyes:     Pupils: Pupils are equal, round, and reactive to light.  Neck:     Musculoskeletal: Normal range of motion.    Cardiovascular:     Rate and Rhythm: Normal rate and regular rhythm.     Heart sounds: Normal heart sounds.  Pulmonary:     Effort: Pulmonary effort is normal.     Breath sounds: Normal breath sounds.  Abdominal:     General: Bowel sounds are normal.     Palpations: Abdomen is soft.  Musculoskeletal: Normal range of motion.        General: No tenderness or deformity.  Lymphadenopathy:     Cervical: No cervical adenopathy.  Skin:    General: Skin is warm and dry.     Findings: No erythema or rash.  Neurological:     Mental Status: He is alert and oriented to person, place, and time.  Psychiatric:        Behavior: Behavior normal.        Thought Content: Thought content normal.        Judgment: Judgment normal.      Lab Results  Component Value Date   WBC 7.2 11/19/2018   HGB 15.3 11/19/2018   HCT 48.2 11/19/2018   MCV 88.6 11/19/2018   PLT 270 11/19/2018   Lab Results  Component Value Date   FERRITIN 15 (L) 09/10/2018   IRON 62 09/10/2018   TIBC 394 09/10/2018   UIBC 332 09/10/2018   IRONPCTSAT 16 (L) 09/10/2018   Lab  Results  Component Value Date   RETICCTPCT 1.6 01/02/2011   RBC 5.44 11/19/2018   RETICCTABS 87.5 01/02/2011   No results found for: KPAFRELGTCHN, LAMBDASER, KAPLAMBRATIO No results found for: Kandis Cocking, IGMSERUM No results found for: Kathrynn Ducking, MSPIKE, SPEI   Chemistry      Component Value Date/Time   NA 134 (L) 11/19/2018 0957   NA 136 08/19/2017 1149   NA 135 (L) 10/21/2016 1151   K 3.8 11/19/2018 0957   K 3.8 08/19/2017 1149   K 3.9 10/21/2016 1151   CL 99 11/19/2018 0957   CL 95 (L) 08/19/2017 1149   CO2 28 11/19/2018 0957   CO2 28 08/19/2017 1149   CO2 25 10/21/2016 1151   BUN 17 11/19/2018 0957   BUN 14 08/19/2017 1149   BUN 13.6 10/21/2016 1151   CREATININE 0.84 11/19/2018 0957   CREATININE 1.0 08/19/2017 1149   CREATININE 0.8 10/21/2016 1151      Component Value  Date/Time   CALCIUM 10.1 11/19/2018 0957   CALCIUM 10.1 08/19/2017 1149   CALCIUM 10.3 10/21/2016 1151   ALKPHOS 54 11/19/2018 0957   ALKPHOS 68 08/19/2017 1149   ALKPHOS 68 10/21/2016 1151   AST 21 11/19/2018 0957   AST 25 10/21/2016 1151   ALT 27 11/19/2018 0957   ALT 32 08/19/2017 1149   ALT 38 10/21/2016 1151   BILITOT 1.3 (H) 11/19/2018 0957   BILITOT 1.22 (H) 10/21/2016 1151      Impression and Plan: Mr. Abril is a very pleasant 78 yo caucasian gentleman with polycythemia, JAK-2 negative.   We will phlebotomize him today.   I will plan to have him come back in 4 months.  I really think that we can spread his appointments out a little bit more.  He is iron deficient so I will think his blood should go up all that quickly.  It really is frustrating to Mr. Ridley that he has to pay the extra fee to walk into our building.     Volanda Napoleon, MD 2/27/202010:37 AM

## 2019-03-04 ENCOUNTER — Telehealth: Payer: Self-pay | Admitting: Family

## 2019-03-04 ENCOUNTER — Encounter: Payer: Self-pay | Admitting: Family

## 2019-03-04 ENCOUNTER — Inpatient Hospital Stay: Payer: Medicare Other

## 2019-03-04 ENCOUNTER — Other Ambulatory Visit: Payer: Self-pay

## 2019-03-04 ENCOUNTER — Inpatient Hospital Stay: Payer: Medicare Other | Attending: Family | Admitting: Family

## 2019-03-04 VITALS — BP 123/82 | HR 83 | Resp 19 | Wt 157.1 lb

## 2019-03-04 VITALS — BP 114/74 | HR 88 | Resp 18

## 2019-03-04 DIAGNOSIS — D45 Polycythemia vera: Secondary | ICD-10-CM

## 2019-03-04 DIAGNOSIS — Z7982 Long term (current) use of aspirin: Secondary | ICD-10-CM | POA: Insufficient documentation

## 2019-03-04 DIAGNOSIS — D500 Iron deficiency anemia secondary to blood loss (chronic): Secondary | ICD-10-CM

## 2019-03-04 LAB — CBC WITH DIFFERENTIAL (CANCER CENTER ONLY)
Abs Immature Granulocytes: 0.03 10*3/uL (ref 0.00–0.07)
Basophils Absolute: 0 10*3/uL (ref 0.0–0.1)
Basophils Relative: 0 %
Eosinophils Absolute: 0.1 10*3/uL (ref 0.0–0.5)
Eosinophils Relative: 1 %
HCT: 49.8 % (ref 39.0–52.0)
Hemoglobin: 15.9 g/dL (ref 13.0–17.0)
Immature Granulocytes: 0 %
Lymphocytes Relative: 25 %
Lymphs Abs: 2.1 10*3/uL (ref 0.7–4.0)
MCH: 27.8 pg (ref 26.0–34.0)
MCHC: 31.9 g/dL (ref 30.0–36.0)
MCV: 87.1 fL (ref 80.0–100.0)
Monocytes Absolute: 1.2 10*3/uL — ABNORMAL HIGH (ref 0.1–1.0)
Monocytes Relative: 14 %
Neutro Abs: 5.1 10*3/uL (ref 1.7–7.7)
Neutrophils Relative %: 60 %
Platelet Count: 297 10*3/uL (ref 150–400)
RBC: 5.72 MIL/uL (ref 4.22–5.81)
RDW: 16.9 % — ABNORMAL HIGH (ref 11.5–15.5)
WBC Count: 8.5 10*3/uL (ref 4.0–10.5)
nRBC: 0 % (ref 0.0–0.2)

## 2019-03-04 LAB — CMP (CANCER CENTER ONLY)
ALT: 21 U/L (ref 0–44)
AST: 18 U/L (ref 15–41)
Albumin: 4.3 g/dL (ref 3.5–5.0)
Alkaline Phosphatase: 52 U/L (ref 38–126)
Anion gap: 8 (ref 5–15)
BUN: 14 mg/dL (ref 8–23)
CO2: 29 mmol/L (ref 22–32)
Calcium: 10.2 mg/dL (ref 8.9–10.3)
Chloride: 92 mmol/L — ABNORMAL LOW (ref 98–111)
Creatinine: 0.85 mg/dL (ref 0.61–1.24)
GFR, Est AFR Am: >60 mL/min (ref >60)
GFR, Estimated: >60 mL/min (ref >60)
Glucose, Bld: 108 mg/dL — ABNORMAL HIGH (ref 70–99)
Potassium: 3.7 mmol/L (ref 3.5–5.1)
Sodium: 129 mmol/L — ABNORMAL LOW (ref 135–145)
Total Bilirubin: 1.6 mg/dL — ABNORMAL HIGH (ref 0.3–1.2)
Total Protein: 7 g/dL (ref 6.5–8.1)

## 2019-03-04 NOTE — Patient Instructions (Signed)

## 2019-03-04 NOTE — Progress Notes (Signed)
Hematology and Oncology Follow Up Visit  NIKKO GOLDWIRE [id number] [birth year] 78 y.o. 03/04/2019   Principle Diagnosis:  Polycythemia vera - JAK2 negative  Current Therapy:   Phlebotomy to maintain hematocrit below 45% Aspirin 81 mg by mouth daily   Interim History:  Mr. Haring is here today for follow-up. He is ding well and has no complaints at this time. He states that he is getting tired of being cooped up.  No episodes of bleeding. He is taking his aspirin daily as prescribed and does bruise easily on his arms. No petechiae.  No fever, chills, n/v, cough, rash, dizziness, SOB, chest pain, palpitations, abdominal pain or changes in bowel or bladder habits.  No swelling, tenderness, numbness or tingling in her extremities.  No lymphadenopathy noted on exam.  He is eating well and staying hydrated. His weight is stable.   ECOG Performance Status: 1 - Symptomatic but completely ambulatory  Medications:  Allergies as of 03/04/2019   No Known Allergies     Medication List       Accurate as of March 04, 2019 11:19 AM. If you have any questions, ask your nurse or doctor.        ALPRAZolam 1 MG tablet Commonly known as: XANAX Take 1 mg by mouth as needed. Take half tablet, a total of 0.5 mg at night as needed for sleep.   aspirin EC 81 MG tablet Take 81 mg by mouth daily.   cholecalciferol 1000 units tablet Commonly known as: VITAMIN D Take 1,000 Units by mouth daily.   fish oil-omega-3 fatty acids 1000 MG capsule Take 1 g by mouth daily.   lisinopril-hydrochlorothiazide 20-12.5 MG tablet Commonly known as: ZESTORETIC Take 1 tablet by mouth daily.   Shingrix injection Generic drug: Zoster Vaccine Adjuvanted   simvastatin 20 MG tablet Commonly known as: ZOCOR What changed: Another medication with the same name was removed. Continue taking this medication, and follow the directions you see here. Changed by: Laverna Peace, NP   traZODone 100 MG tablet Commonly  known as: DESYREL Take 100 mg by mouth at bedtime.   vitamin B-12 1000 MCG tablet Commonly known as: CYANOCOBALAMIN Take 1,000 mcg by mouth once a week.       Allergies: No Known Allergies  Past Medical History, Surgical history, Social history, and Family History were reviewed and updated.  Review of Systems: All other 10 point review of systems is negative.   Physical Exam:  weight is 157 lb 0.8 oz (71.2 kg). His blood pressure is 123/82 and his pulse is 83. His respiration is 19 and oxygen saturation is 100%.   Wt Readings from Last 3 Encounters:  03/04/19 157 lb 0.8 oz (71.2 kg)  11/19/18 161 lb 8 oz (73.3 kg)  09/10/18 156 lb (70.8 kg)    Ocular: Sclerae unicteric, pupils equal, round and reactive to light Ear-nose-throat: Oropharynx clear, dentition fair Lymphatic: No cervical or supraclavicular adenopathy Lungs no rales or rhonchi, good excursion bilaterally Heart regular rate and rhythm, no murmur appreciated Abd soft, nontender, positive bowel sounds, no liver or spleen tip palpated on exam, no fluid wave  MSK no focal spinal tenderness, no joint edema Neuro: non-focal, well-oriented, appropriate affect Breasts: Deferred   Lab Results  Component Value Date   WBC 8.5 03/04/2019   HGB 15.9 03/04/2019   HCT 49.8 03/04/2019   MCV 87.1 03/04/2019   PLT 297 03/04/2019   Lab Results  Component Value Date   FERRITIN 14 (L) 11/19/2018  IRON 72 11/19/2018   TIBC 396 11/19/2018   UIBC 325 11/19/2018   IRONPCTSAT 18 (L) 11/19/2018   Lab Results  Component Value Date   RETICCTPCT 1.6 01/02/2011   RBC 5.72 03/04/2019   RETICCTABS 87.5 01/02/2011   No results found for: KPAFRELGTCHN, LAMBDASER, KAPLAMBRATIO No results found for: IGGSERUM, IGA, IGMSERUM No results found for: Kathrynn Ducking, MSPIKE, SPEI   Chemistry      Component Value Date/Time   NA 129 (L) 03/04/2019 1024   NA 136 08/19/2017 1149   NA 135 (L)  10/21/2016 1151   K 3.7 03/04/2019 1024   K 3.8 08/19/2017 1149   K 3.9 10/21/2016 1151   CL 92 (L) 03/04/2019 1024   CL 95 (L) 08/19/2017 1149   CO2 29 03/04/2019 1024   CO2 28 08/19/2017 1149   CO2 25 10/21/2016 1151   BUN 14 03/04/2019 1024   BUN 14 08/19/2017 1149   BUN 13.6 10/21/2016 1151   CREATININE 0.85 03/04/2019 1024   CREATININE 1.0 08/19/2017 1149   CREATININE 0.8 10/21/2016 1151      Component Value Date/Time   CALCIUM 10.2 03/04/2019 1024   CALCIUM 10.1 08/19/2017 1149   CALCIUM 10.3 10/21/2016 1151   ALKPHOS 52 03/04/2019 1024   ALKPHOS 68 08/19/2017 1149   ALKPHOS 68 10/21/2016 1151   AST 18 03/04/2019 1024   AST 25 10/21/2016 1151   ALT 21 03/04/2019 1024   ALT 32 08/19/2017 1149   ALT 38 10/21/2016 1151   BILITOT 1.6 (H) 03/04/2019 1024   BILITOT 1.22 (H) 10/21/2016 1151       Impression and Plan: Mr. Mcelrath is a very pleasant 78 yo caucasian gentleman with polycythemia, JAK-2 negative.  He is still not thrilled with the cost of his visits. We discussed him donating with [facility] but despite the cost he still prefers to come here. We will proceed with phlebotomy today for Hct  49.8%.  We will plan to see him back in another 3 months.  He will contact our office with any questions or concerns. We can certainly see him sooner if need be.   Laverna Peace, NP 6/11/202011:19 AM

## 2019-03-04 NOTE — Telephone Encounter (Signed)
Appts scheduled letter/calendar  Mailed per 6/11 los

## 2019-03-05 LAB — FERRITIN: Ferritin: 17 ng/mL — ABNORMAL LOW (ref 24–336)

## 2019-03-05 LAB — IRON AND TIBC
Iron: 70 ug/dL (ref 42–163)
Saturation Ratios: 18 % — ABNORMAL LOW (ref 20–55)
TIBC: 390 ug/dL (ref 202–409)
UIBC: 320 ug/dL (ref 117–376)

## 2019-06-04 ENCOUNTER — Inpatient Hospital Stay: Payer: Medicare Other | Attending: Family

## 2019-06-04 ENCOUNTER — Telehealth: Payer: Self-pay | Admitting: Family

## 2019-06-04 ENCOUNTER — Inpatient Hospital Stay: Payer: Medicare Other

## 2019-06-04 ENCOUNTER — Other Ambulatory Visit: Payer: Self-pay

## 2019-06-04 ENCOUNTER — Inpatient Hospital Stay (HOSPITAL_BASED_OUTPATIENT_CLINIC_OR_DEPARTMENT_OTHER): Payer: Medicare Other | Admitting: Family

## 2019-06-04 ENCOUNTER — Encounter: Payer: Self-pay | Admitting: Family

## 2019-06-04 VITALS — BP 123/86 | HR 76 | Resp 18

## 2019-06-04 VITALS — BP 135/78 | HR 75 | Temp 97.1°F | Resp 18 | Ht 66.0 in | Wt 154.0 lb

## 2019-06-04 DIAGNOSIS — D45 Polycythemia vera: Secondary | ICD-10-CM

## 2019-06-04 DIAGNOSIS — D500 Iron deficiency anemia secondary to blood loss (chronic): Secondary | ICD-10-CM | POA: Diagnosis not present

## 2019-06-04 DIAGNOSIS — Z7982 Long term (current) use of aspirin: Secondary | ICD-10-CM | POA: Insufficient documentation

## 2019-06-04 LAB — CBC WITH DIFFERENTIAL (CANCER CENTER ONLY)
Abs Immature Granulocytes: 0.02 10*3/uL (ref 0.00–0.07)
Basophils Absolute: 0 10*3/uL (ref 0.0–0.1)
Basophils Relative: 1 %
Eosinophils Absolute: 0 10*3/uL (ref 0.0–0.5)
Eosinophils Relative: 1 %
HCT: 51.8 % (ref 39.0–52.0)
Hemoglobin: 16.8 g/dL (ref 13.0–17.0)
Immature Granulocytes: 0 %
Lymphocytes Relative: 32 %
Lymphs Abs: 1.9 10*3/uL (ref 0.7–4.0)
MCH: 29.5 pg (ref 26.0–34.0)
MCHC: 32.4 g/dL (ref 30.0–36.0)
MCV: 90.9 fL (ref 80.0–100.0)
Monocytes Absolute: 0.9 10*3/uL (ref 0.1–1.0)
Monocytes Relative: 14 %
Neutro Abs: 3.3 10*3/uL (ref 1.7–7.7)
Neutrophils Relative %: 52 %
Platelet Count: 272 10*3/uL (ref 150–400)
RBC: 5.7 MIL/uL (ref 4.22–5.81)
RDW: 15.7 % — ABNORMAL HIGH (ref 11.5–15.5)
WBC Count: 6.1 10*3/uL (ref 4.0–10.5)
nRBC: 0 % (ref 0.0–0.2)

## 2019-06-04 LAB — CMP (CANCER CENTER ONLY)
ALT: 23 U/L (ref 0–44)
AST: 20 U/L (ref 15–41)
Albumin: 4.3 g/dL (ref 3.5–5.0)
Alkaline Phosphatase: 54 U/L (ref 38–126)
Anion gap: 7 (ref 5–15)
BUN: 12 mg/dL (ref 8–23)
CO2: 29 mmol/L (ref 22–32)
Calcium: 10.2 mg/dL (ref 8.9–10.3)
Chloride: 96 mmol/L — ABNORMAL LOW (ref 98–111)
Creatinine: 0.98 mg/dL (ref 0.61–1.24)
GFR, Est AFR Am: >60 mL/min (ref >60)
GFR, Estimated: >60 mL/min (ref >60)
Glucose, Bld: 94 mg/dL (ref 70–99)
Potassium: 4.1 mmol/L (ref 3.5–5.1)
Sodium: 132 mmol/L — ABNORMAL LOW (ref 135–145)
Total Bilirubin: 1.4 mg/dL — ABNORMAL HIGH (ref 0.3–1.2)
Total Protein: 6.8 g/dL (ref 6.5–8.1)

## 2019-06-04 LAB — FERRITIN: Ferritin: 22 ng/mL — ABNORMAL LOW (ref 24–336)

## 2019-06-04 LAB — IRON AND TIBC
Iron: 91 ug/dL (ref 42–163)
Saturation Ratios: 25 % (ref 20–55)
TIBC: 361 ug/dL (ref 202–409)
UIBC: 270 ug/dL (ref 117–376)

## 2019-06-04 NOTE — Patient Instructions (Signed)
Therapeutic Phlebotomy, Care After This sheet gives you information about how to care for yourself after your procedure. Your health care provider may also give you more specific instructions. If you have problems or questions, contact your health care provider. What can I expect after the procedure? After the procedure, it is common to have:  Light-headedness or dizziness. You may feel faint.  Nausea.  Tiredness (fatigue). Follow these instructions at home: Eating and drinking  Be sure to eat well-balanced meals for the next 24 hours.  Drink enough fluid to keep your urine pale yellow.  Avoid drinking alcohol on the day that you had the procedure. Activity   Return to your normal activities as told by your health care provider. Most people can go back to their normal activities right away.  Avoid activities that take a lot of effort for about 5 hours after the procedure. Athletes should avoid strenuous exercise for at least 12 hours.  Avoid heavy lifting or pulling for about 5 hours after the procedure. Do not lift anything that is heavier than 10 lb (4.5 kg).  Change positions slowly for the remainder of the day. This will help to prevent light-headedness or fainting.  If you feel light-headed, lie down until the feeling goes away. Needle insertion site care   Keep your bandage (dressing) dry. You can remove the bandage after about 5 hours or as told by your health care provider.  If you have bleeding from the needle insertion site, raise (elevate) your arm and press firmly on the site until the bleeding stops.  If you have bruising at the site, apply ice to the area: ? Remove the dressing. ? Put ice in a plastic bag. ? Place a towel between your skin and the bag. ? Leave the ice on for 20 minutes, 2-3 times a day for the first 24 hours.  If the swelling does not go away after 24 hours, apply a warm, moist cloth (warm compress) to the area for 20 minutes, 2-3 times a day.  General instructions  Do not use any products that contain nicotine or tobacco, such as cigarettes and e-cigarettes, for at least 30 minutes after the procedure.  Keep all follow-up visits as told by your health care provider. This is important. You may need to continue having regular therapeutic phlebotomy treatments as directed. Contact a health care provider if you:  Have redness, swelling, or pain at the needle insertion site.  Have fluid or blood coming from the needle insertion site.  Have pus or a bad smell coming from the needle insertion site.  Notice that the needle insertion site feels warm to the touch.  Feel light-headed, dizzy, or nauseous, and the feeling does not go away.  Have new bruising at the needle insertion site.  Feel weaker than normal.  Have a fever or chills. Get help right away if:  You faint.  You have chest pain.  You have trouble breathing.  You have severe nausea or vomiting. Summary  After the procedure, it is common to have some light-headedness, dizziness, nausea, or tiredness (fatigue).  Be sure to eat well-balanced meals for the next 24 hours. Drink enough fluid to keep your urine pale yellow.  Return to your normal activities as told by your health care provider.  Keep all follow-up visits as told by your health care provider. You may need to continue having regular therapeutic phlebotomy treatments as directed. This information is not intended to replace advice given to you   by your health care provider. Make sure you discuss any questions you have with your health care provider. Document Released: 02/11/2011 Document Revised: 09/26/2017 Document Reviewed: 09/25/2017 Elsevier Patient Education  2020 [place]. Therapeutic Phlebotomy Discharge Instructions  - Increase your fluid intake over the next 4 hours  - No smoking for 30 minutes  - Avoid using the affected arm (the one you had the blood drawn from) for heavy lifting or  other activities.  - You may resume all normal activities after 30 minutes.  You are to notify the office if you experience:   - Persistent dizziness and/or lightheadedness -Uncontrolled or excessive bleeding at the site.

## 2019-06-04 NOTE — Progress Notes (Signed)
Hematology and Oncology Follow Up Visit  Jesse [name]:[id number] 21-Sep-1941 78 y.o. 06/04/2019   Principle Diagnosis:  Polycythemia vera - JAK2 negative  Current Therapy:   Phlebotomy to maintain hematocrit below 45% Aspirin 81 mg by mouth daily   Interim History:  Jesse [name] is here today for follow-up and phlebotomy. He is doing well and has no complaints at this time.  His Hct is elevated at 51.8%.  No episodes of bleeding. He does bruise on daily aspirin. No petechiae.  No fatigue. No fever, chills, n/v, cough, rash, dizziness, SOB, chest pain, palpitations, abdominal pain or changes in bowel or bladder habits.  No swelling, tenderness, numbness or tingling in his extremities at this time.  No falls or syncopal episodes.  He is eating well and staying hydrated. His weight is stable.   ECOG Performance Status: 1 - Symptomatic but completely ambulatory  Medications:  Allergies as of 06/04/2019   No Known Allergies     Medication List       Accurate as of June 04, 2019 10:51 AM. If you have any questions, ask your nurse or doctor.        ALPRAZolam 1 MG tablet Commonly known as: XANAX Take 1 mg by mouth as needed. Take half tablet, a total of 0.5 mg at night as needed for sleep.   aspirin EC 81 MG tablet Take 81 mg by mouth daily.   cholecalciferol 1000 units tablet Commonly known as: VITAMIN D Take 1,000 Units by mouth daily.   fish oil-omega-3 fatty acids 1000 MG capsule Take 1 g by mouth daily.   lisinopril-hydrochlorothiazide 20-12.5 MG tablet Commonly known as: ZESTORETIC Take 1 tablet by mouth daily.   Shingrix injection Generic drug: Zoster Vaccine Adjuvanted   simvastatin 20 MG tablet Commonly known as: ZOCOR   traZODone 100 MG tablet Commonly known as: DESYREL Take 100 mg by mouth at bedtime.   vitamin B-12 1000 MCG tablet Commonly known as: CYANOCOBALAMIN Take 1,000 mcg by mouth once a week.       Allergies: No Known Allergies   Past Medical History, Surgical history, Social history, and Family History were reviewed and updated.  Review of Systems: All other 10 point review of systems is negative.   Physical Exam:  height is 5\' 6"  (1.676 m) and weight is 154 lb (69.9 kg). His temporal temperature is 97.1 F (36.2 C) (abnormal). His blood pressure is 135/78 and his pulse is 75. His respiration is 18 and oxygen saturation is 98%.   Wt Readings from Last 3 Encounters:  06/04/19 154 lb (69.9 kg)  03/04/19 157 lb 0.8 oz (71.2 kg)  11/19/18 161 lb 8 oz (73.3 kg)    Ocular: Sclerae unicteric, pupils equal, round and reactive to light Ear-nose-throat: Oropharynx clear, dentition fair Lymphatic: No cervical or supraclavicular adenopathy Lungs no rales or rhonchi, good excursion bilaterally Heart regular rate and rhythm, no murmur appreciated Abd soft, nontender, positive bowel sounds, no liver or spleen tip palpated on exam, no fluid wave  MSK no focal spinal tenderness, no joint edema Neuro: non-focal, well-oriented, appropriate affect Breasts: Deferred   Lab Results  Component Value Date   WBC 6.1 06/04/2019   HGB 16.8 06/04/2019   HCT 51.8 06/04/2019   MCV 90.9 06/04/2019   PLT 272 06/04/2019   Lab Results  Component Value Date   FERRITIN 17 (L) 03/04/2019   IRON 70 03/04/2019   TIBC 390 03/04/2019   UIBC 320 03/04/2019   IRONPCTSAT 18 (  L) 03/04/2019   Lab Results  Component Value Date   RETICCTPCT 1.6 01/02/2011   RBC 5.70 06/04/2019   RETICCTABS 87.5 01/02/2011   No results found for: KPAFRELGTCHN, LAMBDASER, KAPLAMBRATIO No results found for: IGGSERUM, IGA, IGMSERUM No results found for: Kathrynn Ducking, MSPIKE, SPEI   Chemistry      Component Value Date/Time   NA 129 (L) 03/04/2019 1024   NA 136 08/19/2017 1149   NA 135 (L) 10/21/2016 1151   K 3.7 03/04/2019 1024   K 3.8 08/19/2017 1149   K 3.9 10/21/2016 1151   CL 92 (L) 03/04/2019 1024    CL 95 (L) 08/19/2017 1149   CO2 29 03/04/2019 1024   CO2 28 08/19/2017 1149   CO2 25 10/21/2016 1151   BUN 14 03/04/2019 1024   BUN 14 08/19/2017 1149   BUN 13.6 10/21/2016 1151   CREATININE 0.85 03/04/2019 1024   CREATININE 1.0 08/19/2017 1149   CREATININE 0.8 10/21/2016 1151      Component Value Date/Time   CALCIUM 10.2 03/04/2019 1024   CALCIUM 10.1 08/19/2017 1149   CALCIUM 10.3 10/21/2016 1151   ALKPHOS 52 03/04/2019 1024   ALKPHOS 68 08/19/2017 1149   ALKPHOS 68 10/21/2016 1151   AST 18 03/04/2019 1024   AST 25 10/21/2016 1151   ALT 21 03/04/2019 1024   ALT 32 08/19/2017 1149   ALT 38 10/21/2016 1151   BILITOT 1.6 (H) 03/04/2019 1024   BILITOT 1.22 (H) 10/21/2016 1151       Impression and Plan: Jesse [name] is a very pleasant 79yo caucasian gentleman with polycythemia, JAK-2 negative.  We will proceed with phlebotomy today.  We will see him back in another 6 weeks.  He will contact our office with any questions or concerns. We can certainly see him sooner if needed.   Laverna Peace, NP 9/11/202010:51 AM

## 2019-06-04 NOTE — Telephone Encounter (Signed)
Called and spoke with patient regarding appointments added per 9/11 los °

## 2019-07-16 ENCOUNTER — Other Ambulatory Visit: Payer: Self-pay

## 2019-07-16 ENCOUNTER — Encounter: Payer: Self-pay | Admitting: Family

## 2019-07-16 ENCOUNTER — Inpatient Hospital Stay (HOSPITAL_BASED_OUTPATIENT_CLINIC_OR_DEPARTMENT_OTHER): Payer: Medicare Other | Admitting: Family

## 2019-07-16 ENCOUNTER — Inpatient Hospital Stay: Payer: Medicare Other | Attending: Family

## 2019-07-16 ENCOUNTER — Inpatient Hospital Stay: Payer: Medicare Other

## 2019-07-16 VITALS — BP 120/75 | HR 79 | Resp 18

## 2019-07-16 VITALS — BP 137/84 | HR 75 | Temp 97.1°F | Resp 18 | Wt 155.0 lb

## 2019-07-16 DIAGNOSIS — D500 Iron deficiency anemia secondary to blood loss (chronic): Secondary | ICD-10-CM | POA: Diagnosis not present

## 2019-07-16 DIAGNOSIS — Z23 Encounter for immunization: Secondary | ICD-10-CM | POA: Insufficient documentation

## 2019-07-16 DIAGNOSIS — D45 Polycythemia vera: Secondary | ICD-10-CM | POA: Insufficient documentation

## 2019-07-16 LAB — CMP (CANCER CENTER ONLY)
ALT: 18 U/L (ref 0–44)
AST: 17 U/L (ref 15–41)
Albumin: 4.3 g/dL (ref 3.5–5.0)
Alkaline Phosphatase: 48 U/L (ref 38–126)
Anion gap: 6 (ref 5–15)
BUN: 13 mg/dL (ref 8–23)
CO2: 29 mmol/L (ref 22–32)
Calcium: 10.1 mg/dL (ref 8.9–10.3)
Chloride: 96 mmol/L — ABNORMAL LOW (ref 98–111)
Creatinine: 0.8 mg/dL (ref 0.61–1.24)
GFR, Est AFR Am: >60 mL/min (ref >60)
GFR, Estimated: >60 mL/min (ref >60)
Glucose, Bld: 97 mg/dL (ref 70–99)
Potassium: 3.9 mmol/L (ref 3.5–5.1)
Sodium: 131 mmol/L — ABNORMAL LOW (ref 135–145)
Total Bilirubin: 1.3 mg/dL — ABNORMAL HIGH (ref 0.3–1.2)
Total Protein: 6.7 g/dL (ref 6.5–8.1)

## 2019-07-16 LAB — IRON AND TIBC
Iron: 61 ug/dL (ref 42–163)
Saturation Ratios: 17 % — ABNORMAL LOW (ref 20–55)
TIBC: 370 ug/dL (ref 202–409)
UIBC: 309 ug/dL (ref 117–376)

## 2019-07-16 LAB — CBC WITH DIFFERENTIAL (CANCER CENTER ONLY)
Abs Immature Granulocytes: 0.05 10*3/uL (ref 0.00–0.07)
Basophils Absolute: 0 10*3/uL (ref 0.0–0.1)
Basophils Relative: 1 %
Eosinophils Absolute: 0 10*3/uL (ref 0.0–0.5)
Eosinophils Relative: 1 %
HCT: 48.2 % (ref 39.0–52.0)
Hemoglobin: 15.7 g/dL (ref 13.0–17.0)
Immature Granulocytes: 1 %
Lymphocytes Relative: 30 %
Lymphs Abs: 1.8 10*3/uL (ref 0.7–4.0)
MCH: 29.5 pg (ref 26.0–34.0)
MCHC: 32.6 g/dL (ref 30.0–36.0)
MCV: 90.4 fL (ref 80.0–100.0)
Monocytes Absolute: 1 10*3/uL (ref 0.1–1.0)
Monocytes Relative: 16 %
Neutro Abs: 3.2 10*3/uL (ref 1.7–7.7)
Neutrophils Relative %: 51 %
Platelet Count: 247 10*3/uL (ref 150–400)
RBC: 5.33 MIL/uL (ref 4.22–5.81)
RDW: 15 % (ref 11.5–15.5)
WBC Count: 6.1 10*3/uL (ref 4.0–10.5)
nRBC: 0 % (ref 0.0–0.2)

## 2019-07-16 LAB — RETICULOCYTES
Immature Retic Fract: 8.9 % (ref 2.3–15.9)
RBC.: 5.18 MIL/uL (ref 4.22–5.81)
Retic Count, Absolute: 81.8 10*3/uL (ref 19.0–186.0)
Retic Ct Pct: 1.6 % (ref 0.4–3.1)

## 2019-07-16 LAB — FERRITIN: Ferritin: 14 ng/mL — ABNORMAL LOW (ref 24–336)

## 2019-07-16 MED ORDER — INFLUENZA VAC SPLIT QUAD 0.5 ML IM SUSY
0.5000 mL | PREFILLED_SYRINGE | Freq: Once | INTRAMUSCULAR | Status: AC
  Administered 2019-07-16: 0.5 mL via INTRAMUSCULAR

## 2019-07-16 MED ORDER — INFLUENZA VAC SPLIT QUAD 0.5 ML IM SUSY
PREFILLED_SYRINGE | INTRAMUSCULAR | Status: AC
  Filled 2019-07-16: qty 0.5

## 2019-07-16 NOTE — Progress Notes (Signed)
Jesse [name] presents today for phlebotomy per MD orders. Phlebotomy procedure started at 1112 and ended at 1117. 528 cc removed via 16 g needle at L antecubital site. Patient tolerated procedure well.

## 2019-07-16 NOTE — Patient Instructions (Signed)
Therapeutic Phlebotomy, Care After This sheet gives you information about how to care for yourself after your procedure. Your health care provider may also give you more specific instructions. If you have problems or questions, contact your health care provider. What can I expect after the procedure? After the procedure, it is common to have:  Light-headedness or dizziness. You may feel faint.  Nausea.  Tiredness (fatigue). Follow these instructions at home: Eating and drinking  Be sure to eat well-balanced meals for the next 24 hours.  Drink enough fluid to keep your urine pale yellow.  Avoid drinking alcohol on the day that you had the procedure. Activity   Return to your normal activities as told by your health care provider. Most people can go back to their normal activities right away.  Avoid activities that take a lot of effort for about 5 hours after the procedure. Athletes should avoid strenuous exercise for at least 12 hours.  Avoid heavy lifting or pulling for about 5 hours after the procedure. Do not lift anything that is heavier than 10 lb (4.5 kg).  Change positions slowly for the remainder of the day. This will help to prevent light-headedness or fainting.  If you feel light-headed, lie down until the feeling goes away. Needle insertion site care   Keep your bandage (dressing) dry. You can remove the bandage after about 5 hours or as told by your health care provider.  If you have bleeding from the needle insertion site, raise (elevate) your arm and press firmly on the site until the bleeding stops.  If you have bruising at the site, apply ice to the area: ? Remove the dressing. ? Put ice in a plastic bag. ? Place a towel between your skin and the bag. ? Leave the ice on for 20 minutes, 2-3 times a day for the first 24 hours.  If the swelling does not go away after 24 hours, apply a warm, moist cloth (warm compress) to the area for 20 minutes, 2-3 times a  day. General instructions  Do not use any products that contain nicotine or tobacco, such as cigarettes and e-cigarettes, for at least 30 minutes after the procedure.  Keep all follow-up visits as told by your health care provider. This is important. You may need to continue having regular therapeutic phlebotomy treatments as directed. Contact a health care provider if you:  Have redness, swelling, or pain at the needle insertion site.  Have fluid or blood coming from the needle insertion site.  Have pus or a bad smell coming from the needle insertion site.  Notice that the needle insertion site feels warm to the touch.  Feel light-headed, dizzy, or nauseous, and the feeling does not go away.  Have new bruising at the needle insertion site.  Feel weaker than normal.  Have a fever or chills. Get help right away if:  You faint.  You have chest pain.  You have trouble breathing.  You have severe nausea or vomiting. Summary  After the procedure, it is common to have some light-headedness, dizziness, nausea, or tiredness (fatigue).  Be sure to eat well-balanced meals for the next 24 hours. Drink enough fluid to keep your urine pale yellow.  Return to your normal activities as told by your health care provider.  Keep all follow-up visits as told by your health care provider. You may need to continue having regular therapeutic phlebotomy treatments as directed. This information is not intended to replace advice given to you  by your health care provider. Make sure you discuss any questions you have with your health care provider. Document Released: 02/11/2011 Document Revised: 09/26/2017 Document Reviewed: 09/25/2017 Elsevier Patient Education  2020 [employer].   Influenza, Adult Influenza, more commonly known as "the flu," is a viral infection that mainly affects the respiratory tract. The respiratory tract includes organs that help you breathe, such as the lungs, nose, and  throat. The flu causes many symptoms similar to the common cold along with high fever and body aches. The flu spreads easily from person to person (is contagious). Getting a flu shot (influenza vaccination) every year is the best way to prevent the flu. What are the causes? This condition is caused by the influenza virus. You can get the virus by:  Breathing in droplets that are in the air from an infected person's cough or sneeze.  Touching something that has been exposed to the virus (has been contaminated) and then touching your mouth, nose, or eyes. What increases the risk? The following factors may make you more likely to get the flu:  Not washing or sanitizing your hands often.  Having close contact with many people during cold and flu season.  Touching your mouth, eyes, or nose without first washing or sanitizing your hands.  Not getting a yearly (annual) flu shot. You may have a higher risk for the flu, including serious problems such as a lung infection (pneumonia), if you:  Are older than 65.  Are pregnant.  Have a weakened disease-fighting system (immune system). You may have a weakened immune system if you: ? Have HIV or AIDS. ? Are undergoing chemotherapy. ? Are taking medicines that reduce (suppress) the activity of your immune system.  Have a long-term (chronic) illness, such as heart disease, kidney disease, diabetes, or lung disease.  Have a liver disorder.  Are severely overweight (morbidly obese).  Have anemia. This is a condition that affects your red blood cells.  Have asthma. What are the signs or symptoms? Symptoms of this condition usually begin suddenly and last 4-14 days. They may include:  Fever and chills.  Headaches, body aches, or muscle aches.  Sore throat.  Cough.  Runny or stuffy (congested) nose.  Chest discomfort.  Poor appetite.  Weakness or fatigue.  Dizziness.  Nausea or vomiting. How is this diagnosed? This condition  may be diagnosed based on:  Your symptoms and medical history.  A physical exam.  Swabbing your nose or throat and testing the fluid for the influenza virus. How is this treated? If the flu is diagnosed early, you can be treated with medicine that can help reduce how severe the illness is and how long it lasts (antiviral medicine). This may be given by mouth (orally) or through an IV. Taking care of yourself at home can help relieve symptoms. Your health care provider may recommend:  Taking over-the-counter medicines.  Drinking plenty of fluids. In many cases, the flu goes away on its own. If you have severe symptoms or complications, you may be treated in a hospital. Follow these instructions at home: Activity  Rest as needed and get plenty of sleep.  Stay home from work or school as told by your health care provider. Unless you are visiting your health care provider, avoid leaving home until your fever has been gone for 24 hours without taking medicine. Eating and drinking  Take an oral rehydration solution (ORS). This is a drink that is sold at pharmacies and retail stores.  Drink  enough fluid to keep your urine pale yellow.  Drink clear fluids in small amounts as you are able. Clear fluids include water, ice chips, diluted fruit juice, and low-calorie sports drinks.  Eat bland, easy-to-digest foods in small amounts as you are able. These foods include bananas, applesauce, rice, lean meats, toast, and crackers.  Avoid drinking fluids that contain a lot of sugar or caffeine, such as energy drinks, regular sports drinks, and soda.  Avoid alcohol.  Avoid spicy or fatty foods. General instructions      Take over-the-counter and prescription medicines only as told by your health care provider.  Use a cool mist humidifier to add humidity to the air in your home. This can make it easier to breathe.  Cover your mouth and nose when you cough or sneeze.  Wash your hands with  soap and water often, especially after you cough or sneeze. If soap and water are not available, use alcohol-based hand sanitizer.  Keep all follow-up visits as told by your health care provider. This is important. How is this prevented?   Get an annual flu shot. You may get the flu shot in late summer, fall, or winter. Ask your health care provider when you should get your flu shot.  Avoid contact with people who are sick during cold and flu season. This is generally fall and winter. Contact a health care provider if:  You develop new symptoms.  You have: ? Chest pain. ? Diarrhea. ? A fever.  Your cough gets worse.  You produce more mucus.  You feel nauseous or you vomit. Get help right away if:  You develop shortness of breath or difficulty breathing.  Your skin or nails turn a bluish color.  You have severe pain or stiffness in your neck.  You develop a sudden headache or sudden pain in your face or ear.  You cannot eat or drink without vomiting. Summary  Influenza, more commonly known as "the flu," is a viral infection that primarily affects your respiratory tract.  Symptoms of the flu usually begin suddenly and last 4-14 days.  Getting an annual flu shot is the best way to prevent getting the flu.  Stay home from work or school as told by your health care provider. Unless you are visiting your health care provider, avoid leaving home until your fever has been gone for 24 hours without taking medicine.  Keep all follow-up visits as told by your health care provider. This is important. This information is not intended to replace advice given to you by your health care provider. Make sure you discuss any questions you have with your health care provider. Document Released: 09/06/2000 Document Revised: 12/10/2018 Document Reviewed: 02/25/2018 Elsevier Patient Education  2020 [employer].

## 2019-07-16 NOTE — Progress Notes (Addendum)
Hematology and Oncology Follow Up Visit  Jesse [name] [id number] 12-Jun-1941 78 y.o. 07/16/2019   Principle Diagnosis:  Polycythemia vera - JAK2 negative  Current Therapy:   Phlebotomy to maintain hematocrit below 45% Aspirin 81 mg by mouth daily   Interim History:  Jesse [name] is here today for follow-up and phlebotomy. Hct is 48.2%. He is taking his aspirin daily as prescribed.  No bleeding, bruising or petechiae.  He denies fatigue.  No fever, chills, n/v, cough, rash, dizziness, SOB, chest pain, palpitations, abdominal pain or changes in bowel or bladder habits.  He has arthritis in his joints that is bothersome at times.  No swelling, numbness or tingling in her extremities.  No falls or syncope.  He is eating well and hydrating. His weight is stable.   ECOG Performance Status: 0 - Asymptomatic  Medications:  Allergies as of 07/16/2019   No Known Allergies     Medication List       Accurate as of July 16, 2019 10:38 AM. If you have any questions, ask your nurse or doctor.        ALPRAZolam 1 MG tablet Commonly known as: XANAX Take 1 mg by mouth as needed. Take half tablet, a total of 0.5 mg at night as needed for sleep.   aspirin EC 81 MG tablet Take 81 mg by mouth daily.   cholecalciferol 1000 units tablet Commonly known as: VITAMIN D Take 1,000 Units by mouth daily.   fish oil-omega-3 fatty acids 1000 MG capsule Take 1 g by mouth daily.   lisinopril-hydrochlorothiazide 20-12.5 MG tablet Commonly known as: ZESTORETIC Take 1 tablet by mouth daily.   Shingrix injection Generic drug: Zoster Vaccine Adjuvanted   simvastatin 20 MG tablet Commonly known as: ZOCOR   traZODone 100 MG tablet Commonly known as: DESYREL Take 100 mg by mouth at bedtime.   vitamin B-12 1000 MCG tablet Commonly known as: CYANOCOBALAMIN Take 1,000 mcg by mouth once a week.       Allergies: No Known Allergies  Past Medical History, Surgical history, Social history,  and Family History were reviewed and updated.  Review of Systems: All other 10 point review of systems is negative.   Physical Exam:  weight is 155 lb (70.3 kg). His temporal temperature is 97.1 F (36.2 C) (abnormal). His blood pressure is 137/84 and his pulse is 75. His respiration is 18 and oxygen saturation is 100%.   Wt Readings from Last 3 Encounters:  07/16/19 155 lb (70.3 kg)  06/04/19 154 lb (69.9 kg)  03/04/19 157 lb 0.8 oz (71.2 kg)    Ocular: Sclerae unicteric, pupils equal, round and reactive to light Ear-nose-throat: Oropharynx clear, dentition fair Lymphatic: No cervical or supraclavicular adenopathy Lungs no rales or rhonchi, good excursion bilaterally Heart regular rate and rhythm, no murmur appreciated Abd soft, nontender, positive bowel sounds, no liver or spleen tip palpated on exam, no fluid wave  MSK no focal spinal tenderness, no joint edema Neuro: non-focal, well-oriented, appropriate affect Breasts: Deferred   Lab Results  Component Value Date   WBC 6.1 07/16/2019   HGB 15.7 07/16/2019   HCT 48.2 07/16/2019   MCV 90.4 07/16/2019   PLT 247 07/16/2019   Lab Results  Component Value Date   FERRITIN 22 (L) 06/04/2019   IRON 91 06/04/2019   TIBC 361 06/04/2019   UIBC 270 06/04/2019   IRONPCTSAT 25 06/04/2019   Lab Results  Component Value Date   RETICCTPCT 1.6 07/16/2019   RBC 5.33  07/16/2019   RBC 5.18 07/16/2019   RETICCTABS 87.5 01/02/2011   No results found for: KPAFRELGTCHN, LAMBDASER, KAPLAMBRATIO No results found for: IGGSERUM, IGA, IGMSERUM No results found for: Kathrynn Ducking, MSPIKE, SPEI   Chemistry      Component Value Date/Time   NA 132 (L) 06/04/2019 1015   NA 136 08/19/2017 1149   NA 135 (L) 10/21/2016 1151   K 4.1 06/04/2019 1015   K 3.8 08/19/2017 1149   K 3.9 10/21/2016 1151   CL 96 (L) 06/04/2019 1015   CL 95 (L) 08/19/2017 1149   CO2 29 06/04/2019 1015   CO2 28 08/19/2017  1149   CO2 25 10/21/2016 1151   BUN 12 06/04/2019 1015   BUN 14 08/19/2017 1149   BUN 13.6 10/21/2016 1151   CREATININE 0.98 06/04/2019 1015   CREATININE 1.0 08/19/2017 1149   CREATININE 0.8 10/21/2016 1151      Component Value Date/Time   CALCIUM 10.2 06/04/2019 1015   CALCIUM 10.1 08/19/2017 1149   CALCIUM 10.3 10/21/2016 1151   ALKPHOS 54 06/04/2019 1015   ALKPHOS 68 08/19/2017 1149   ALKPHOS 68 10/21/2016 1151   AST 20 06/04/2019 1015   AST 25 10/21/2016 1151   ALT 23 06/04/2019 1015   ALT 32 08/19/2017 1149   ALT 38 10/21/2016 1151   BILITOT 1.4 (H) 06/04/2019 1015   BILITOT 1.22 (H) 10/21/2016 1151       Impression and Plan: Jesse [name] is a very pleasant 78yo caucasian gentleman with polycythemia, JAK-2 negative. We will proceed with phlebotomy today for Hct 48.2%.  He also got his flu shot today.  We will see him back in another 6 weeks.  He will contact our office with any questions or concerns. We can certainly see him sooner if needed.   Laverna Peace, NP 10/23/202010:38 AM

## 2019-08-26 ENCOUNTER — Inpatient Hospital Stay: Payer: Medicare Other | Attending: Hematology & Oncology

## 2019-08-26 ENCOUNTER — Encounter: Payer: Self-pay | Admitting: Family

## 2019-08-26 ENCOUNTER — Inpatient Hospital Stay: Payer: Medicare Other

## 2019-08-26 ENCOUNTER — Inpatient Hospital Stay: Payer: Medicare Other | Attending: Family | Admitting: Family

## 2019-08-26 ENCOUNTER — Other Ambulatory Visit: Payer: Self-pay

## 2019-08-26 VITALS — BP 132/85 | HR 90 | Temp 97.6°F | Resp 18 | Ht 66.0 in | Wt 156.0 lb

## 2019-08-26 DIAGNOSIS — D500 Iron deficiency anemia secondary to blood loss (chronic): Secondary | ICD-10-CM | POA: Diagnosis not present

## 2019-08-26 DIAGNOSIS — D45 Polycythemia vera: Secondary | ICD-10-CM

## 2019-08-26 DIAGNOSIS — Z7982 Long term (current) use of aspirin: Secondary | ICD-10-CM | POA: Diagnosis not present

## 2019-08-26 LAB — CMP (CANCER CENTER ONLY)
ALT: 20 U/L (ref 0–44)
AST: 17 U/L (ref 15–41)
Albumin: 4.4 g/dL (ref 3.5–5.0)
Alkaline Phosphatase: 56 U/L (ref 38–126)
Anion gap: 7 (ref 5–15)
BUN: 16 mg/dL (ref 8–23)
CO2: 30 mmol/L (ref 22–32)
Calcium: 10.2 mg/dL (ref 8.9–10.3)
Chloride: 98 mmol/L (ref 98–111)
Creatinine: 0.93 mg/dL (ref 0.61–1.24)
GFR, Est AFR Am: >60 mL/min (ref >60)
GFR, Estimated: >60 mL/min (ref >60)
Glucose, Bld: 107 mg/dL — ABNORMAL HIGH (ref 70–99)
Potassium: 4 mmol/L (ref 3.5–5.1)
Sodium: 135 mmol/L (ref 135–145)
Total Bilirubin: 1 mg/dL (ref 0.3–1.2)
Total Protein: 7 g/dL (ref 6.5–8.1)

## 2019-08-26 LAB — CBC WITH DIFFERENTIAL (CANCER CENTER ONLY)
Abs Immature Granulocytes: 0.03 10*3/uL (ref 0.00–0.07)
Basophils Absolute: 0 10*3/uL (ref 0.0–0.1)
Basophils Relative: 0 %
Eosinophils Absolute: 0.1 10*3/uL (ref 0.0–0.5)
Eosinophils Relative: 1 %
HCT: 47.5 % (ref 39.0–52.0)
Hemoglobin: 15.7 g/dL (ref 13.0–17.0)
Immature Granulocytes: 0 %
Lymphocytes Relative: 25 %
Lymphs Abs: 2.2 10*3/uL (ref 0.7–4.0)
MCH: 29.6 pg (ref 26.0–34.0)
MCHC: 33.1 g/dL (ref 30.0–36.0)
MCV: 89.6 fL (ref 80.0–100.0)
Monocytes Absolute: 1.1 10*3/uL — ABNORMAL HIGH (ref 0.1–1.0)
Monocytes Relative: 13 %
Neutro Abs: 5.2 10*3/uL (ref 1.7–7.7)
Neutrophils Relative %: 61 %
Platelet Count: 292 10*3/uL (ref 150–400)
RBC: 5.3 MIL/uL (ref 4.22–5.81)
RDW: 14.6 % (ref 11.5–15.5)
WBC Count: 8.6 10*3/uL (ref 4.0–10.5)
nRBC: 0 % (ref 0.0–0.2)

## 2019-08-26 LAB — RETICULOCYTES
Immature Retic Fract: 11.9 % (ref 2.3–15.9)
RBC.: 5.28 MIL/uL (ref 4.22–5.81)
Retic Count, Absolute: 81.8 K/uL (ref 19.0–186.0)
Retic Ct Pct: 1.6 % (ref 0.4–3.1)

## 2019-08-26 NOTE — Patient Instructions (Signed)

## 2019-08-26 NOTE — Progress Notes (Signed)
Hematology and Oncology Follow Up Visit  [name] [id number] 1940/11/09 78 y.o. 08/26/2019   Principle Diagnosis:  Polycythemia vera - JAK2 negative  Current Therapy:   Phlebotomy to maintain hematocrit below 45% Aspirin 81 mg by mouth daily   Interim History:  Jesse [name] is here today for follow-up and phlebotomy. Hct today is 47.5%.  He has some fatigue at times.  He has an aches sometimes in the left lower leg he states likely due to arthritis. This comes and goes.  No fever, chills, n/v, cough, rash, dizziness, SOB, chest pain, palpitations, abdominal pain or changes in bowel or bladder habits.  No swelling, tenderness, numbness or tingling in her extremities.  No falls or syncopal episodes.  He has maintained a good appetite and is staying well hydrated. His weight is stable.   ECOG Performance Status: 1 - Symptomatic but completely ambulatory  Medications:  Allergies as of 08/26/2019   No Known Allergies     Medication List       Accurate as of August 26, 2019  2:01 PM. If you have any questions, ask your nurse or doctor.        ALPRAZolam 1 MG tablet Commonly known as: XANAX Take 1 mg by mouth as needed. Take half tablet, a total of 0.5 mg at night as needed for sleep.   aspirin EC 81 MG tablet Take 81 mg by mouth daily.   cholecalciferol 1000 units tablet Commonly known as: VITAMIN D Take 1,000 Units by mouth daily.   fish oil-omega-3 fatty acids 1000 MG capsule Take 1 g by mouth daily.   lisinopril-hydrochlorothiazide 20-12.5 MG tablet Commonly known as: ZESTORETIC Take 1 tablet by mouth daily.   Shingrix injection Generic drug: Zoster Vaccine Adjuvanted   simvastatin 20 MG tablet Commonly known as: ZOCOR   traZODone 100 MG tablet Commonly known as: DESYREL Take 100 mg by mouth at bedtime.   vitamin B-12 1000 MCG tablet Commonly known as: CYANOCOBALAMIN Take 1,000 mcg by mouth once a week.       Allergies: No Known Allergies  Past  Medical History, Surgical history, Social history, and Family History were reviewed and updated.  Review of Systems: All other 10 point review of systems is negative.   Physical Exam:  vitals were not taken for this visit.   Wt Readings from Last 3 Encounters:  07/16/19 155 lb (70.3 kg)  06/04/19 154 lb (69.9 kg)  03/04/19 157 lb 0.8 oz (71.2 kg)    Ocular: Sclerae unicteric, pupils equal, round and reactive to light Ear-nose-throat: Oropharynx clear, dentition fair Lymphatic: No cervical or supraclavicular adenopathy Lungs no rales or rhonchi, good excursion bilaterally Heart regular rate and rhythm, no murmur appreciated Abd soft, nontender, positive bowel sounds, no liver or spleen tip palpated on exam, no fluid wave  MSK no focal spinal tenderness, no joint edema Neuro: non-focal, well-oriented, appropriate affect Breasts: Deferred   Lab Results  Component Value Date   WBC 8.6 08/26/2019   HGB 15.7 08/26/2019   HCT 47.5 08/26/2019   MCV 89.6 08/26/2019   PLT 292 08/26/2019   Lab Results  Component Value Date   FERRITIN 14 (L) 07/16/2019   IRON 61 07/16/2019   TIBC 370 07/16/2019   UIBC 309 07/16/2019   IRONPCTSAT 17 (L) 07/16/2019   Lab Results  Component Value Date   RETICCTPCT 1.6 08/26/2019   RBC 5.28 08/26/2019   RETICCTABS 87.5 01/02/2011   No results found for: KPAFRELGTCHN, LAMBDASER, KAPLAMBRATIO No  results found for: Kandis Cocking, IGMSERUM No results found for: Kathrynn Ducking, MSPIKE, SPEI   Chemistry      Component Value Date/Time   NA 131 (L) 07/16/2019 1021   NA 136 08/19/2017 1149   NA 135 (L) 10/21/2016 1151   K 3.9 07/16/2019 1021   K 3.8 08/19/2017 1149   K 3.9 10/21/2016 1151   CL 96 (L) 07/16/2019 1021   CL 95 (L) 08/19/2017 1149   CO2 29 07/16/2019 1021   CO2 28 08/19/2017 1149   CO2 25 10/21/2016 1151   BUN 13 07/16/2019 1021   BUN 14 08/19/2017 1149   BUN 13.6 10/21/2016 1151    CREATININE 0.80 07/16/2019 1021   CREATININE 1.0 08/19/2017 1149   CREATININE 0.8 10/21/2016 1151      Component Value Date/Time   CALCIUM 10.1 07/16/2019 1021   CALCIUM 10.1 08/19/2017 1149   CALCIUM 10.3 10/21/2016 1151   ALKPHOS 48 07/16/2019 1021   ALKPHOS 68 08/19/2017 1149   ALKPHOS 68 10/21/2016 1151   AST 17 07/16/2019 1021   AST 25 10/21/2016 1151   ALT 18 07/16/2019 1021   ALT 32 08/19/2017 1149   ALT 38 10/21/2016 1151   BILITOT 1.3 (H) 07/16/2019 1021   BILITOT 1.22 (H) 10/21/2016 1151       Impression and Plan: Jesse [name] is a very pleasant 78yo caucasian gentleman with polycythemia, JAK-2 negative. We will proceed with phlebotomy today for Hct 47.5%.  We will see him back in another 6 weeks.  He will contact our office with any questions or concerns. We can certainly see him sooner if needed.   Laverna Peace, NP 12/3/20202:01 PM

## 2019-08-27 LAB — IRON AND TIBC
Iron: 47 ug/dL (ref 45–182)
Saturation Ratios: 12 % — ABNORMAL LOW (ref 17.9–39.5)
TIBC: 406 ug/dL (ref 250–450)
UIBC: 359 ug/dL

## 2019-08-27 LAB — FERRITIN: Ferritin: 11 ng/mL — ABNORMAL LOW (ref 24–336)

## 2019-10-07 ENCOUNTER — Other Ambulatory Visit: Payer: Self-pay | Admitting: *Deleted

## 2019-10-07 ENCOUNTER — Encounter: Payer: Self-pay | Admitting: Family

## 2019-10-07 ENCOUNTER — Telehealth: Payer: Self-pay | Admitting: Family

## 2019-10-07 ENCOUNTER — Inpatient Hospital Stay (HOSPITAL_BASED_OUTPATIENT_CLINIC_OR_DEPARTMENT_OTHER): Payer: Medicare Other | Admitting: Family

## 2019-10-07 ENCOUNTER — Inpatient Hospital Stay: Payer: Medicare Other

## 2019-10-07 ENCOUNTER — Other Ambulatory Visit: Payer: Self-pay

## 2019-10-07 ENCOUNTER — Inpatient Hospital Stay: Payer: Medicare Other | Attending: Family

## 2019-10-07 VITALS — BP 128/77 | HR 84 | Resp 20

## 2019-10-07 VITALS — BP 121/82 | HR 84 | Temp 97.1°F | Resp 18 | Ht 66.0 in | Wt 157.0 lb

## 2019-10-07 DIAGNOSIS — D500 Iron deficiency anemia secondary to blood loss (chronic): Secondary | ICD-10-CM | POA: Diagnosis not present

## 2019-10-07 DIAGNOSIS — D45 Polycythemia vera: Secondary | ICD-10-CM | POA: Diagnosis not present

## 2019-10-07 LAB — CBC WITH DIFFERENTIAL (CANCER CENTER ONLY)
Abs Immature Granulocytes: 0.02 10*3/uL (ref 0.00–0.07)
Basophils Absolute: 0 10*3/uL (ref 0.0–0.1)
Basophils Relative: 0 %
Eosinophils Absolute: 0.1 10*3/uL (ref 0.0–0.5)
Eosinophils Relative: 1 %
HCT: 47.3 % (ref 39.0–52.0)
Hemoglobin: 15.2 g/dL (ref 13.0–17.0)
Immature Granulocytes: 0 %
Lymphocytes Relative: 29 %
Lymphs Abs: 2.2 10*3/uL (ref 0.7–4.0)
MCH: 28.7 pg (ref 26.0–34.0)
MCHC: 32.1 g/dL (ref 30.0–36.0)
MCV: 89.2 fL (ref 80.0–100.0)
Monocytes Absolute: 1 10*3/uL (ref 0.1–1.0)
Monocytes Relative: 13 %
Neutro Abs: 4.3 10*3/uL (ref 1.7–7.7)
Neutrophils Relative %: 57 %
Platelet Count: 304 10*3/uL (ref 150–400)
RBC: 5.3 MIL/uL (ref 4.22–5.81)
RDW: 14.5 % (ref 11.5–15.5)
WBC Count: 7.6 10*3/uL (ref 4.0–10.5)
nRBC: 0 % (ref 0.0–0.2)

## 2019-10-07 LAB — CMP (CANCER CENTER ONLY)
ALT: 20 U/L (ref 0–44)
AST: 17 U/L (ref 15–41)
Albumin: 4.2 g/dL (ref 3.5–5.0)
Alkaline Phosphatase: 56 U/L (ref 38–126)
Anion gap: 7 (ref 5–15)
BUN: 18 mg/dL (ref 8–23)
CO2: 28 mmol/L (ref 22–32)
Calcium: 10.2 mg/dL (ref 8.9–10.3)
Chloride: 97 mmol/L — ABNORMAL LOW (ref 98–111)
Creatinine: 0.85 mg/dL (ref 0.61–1.24)
GFR, Est AFR Am: >60 mL/min (ref >60)
GFR, Estimated: >60 mL/min (ref >60)
Glucose, Bld: 127 mg/dL — ABNORMAL HIGH (ref 70–99)
Potassium: 3.9 mmol/L (ref 3.5–5.1)
Sodium: 132 mmol/L — ABNORMAL LOW (ref 135–145)
Total Bilirubin: 1 mg/dL (ref 0.3–1.2)
Total Protein: 6.6 g/dL (ref 6.5–8.1)

## 2019-10-07 LAB — RETICULOCYTES
Immature Retic Fract: 14.5 % (ref 2.3–15.9)
RBC.: 5.27 MIL/uL (ref 4.22–5.81)
Retic Count, Absolute: 81.2 10*3/uL (ref 19.0–186.0)
Retic Ct Pct: 1.5 % (ref 0.4–3.1)

## 2019-10-07 NOTE — Telephone Encounter (Signed)
Called and spoke with patient regarding appointments that were added per 1/14 los

## 2019-10-07 NOTE — Telephone Encounter (Signed)
Appointments scheduled calendar printed per 1/14 los

## 2019-10-07 NOTE — Progress Notes (Signed)
1217 16g PIV inserted to L AC removed 1 unit of blood over 8 minutes. Pt tolerated procedure without difficulty. Nutrition and hydration provided. Pt refused to stay for full 30 minute observation period. VSS pt Discharged.

## 2019-10-07 NOTE — Progress Notes (Signed)
Hematology and Oncology Follow Up Visit  COVE HONDROS [id number] [birth year] 79 y.o. 10/07/2019   Principle Diagnosis:  Polycythemia vera - JAK2 negative  Current Therapy:   Phlebotomy to maintain hematocrit below 45% Aspirin 81 mg by mouth daily   Interim History:  Mr. Schweers is here today for follow-up and phlebotomy. Hct today is 47.3%.  He has some mild fatigue at times.  No falls or syncopal episodes to report.  No fever, chills, n/v, cough, rash, dizziness, SOB, chest pain, palpitations, abdominal pain or changes in bowel or bladder habits.  No swelling, numbness or tingling in his extremities.  He has aches and tenderness in his legs that waxes and wanes which he attributes to arthritis.  He has maintained a good appetite and is staying well hydrated. His weight is stable.   ECOG Performance Status: 1 - Symptomatic but completely ambulatory  Medications:  Allergies as of 10/07/2019   No Known Allergies     Medication List       Accurate as of October 07, 2019 11:54 AM. If you have any questions, ask your nurse or doctor.        ALPRAZolam 1 MG tablet Commonly known as: XANAX Take 1 mg by mouth as needed. Take half tablet, a total of 0.5 mg at night as needed for sleep.   aspirin EC 81 MG tablet Take 81 mg by mouth daily.   cholecalciferol 1000 units tablet Commonly known as: VITAMIN D Take 1,000 Units by mouth daily.   fish oil-omega-3 fatty acids 1000 MG capsule Take 1 g by mouth daily.   lisinopril-hydrochlorothiazide 20-12.5 MG tablet Commonly known as: ZESTORETIC Take 1 tablet by mouth daily.   simvastatin 20 MG tablet Commonly known as: ZOCOR   traZODone 100 MG tablet Commonly known as: DESYREL Take 100 mg by mouth at bedtime.   vitamin B-12 1000 MCG tablet Commonly known as: CYANOCOBALAMIN Take 1,000 mcg by mouth once a week.       Allergies: No Known Allergies  Past Medical History, Surgical history, Social history, and Family History  were reviewed and updated.  Review of Systems: All other 10 point review of systems is negative.   Physical Exam:  vitals were not taken for this visit.   Wt Readings from Last 3 Encounters:  08/26/19 156 lb (70.8 kg)  07/16/19 155 lb (70.3 kg)  06/04/19 154 lb (69.9 kg)    Ocular: Sclerae unicteric, pupils equal, round and reactive to light Ear-nose-throat: Oropharynx clear, dentition fair Lymphatic: No cervical or supraclavicular adenopathy Lungs no rales or rhonchi, good excursion bilaterally Heart regular rate and rhythm, no murmur appreciated Abd soft, nontender, positive bowel sounds, no liver or spleen tip palpated on exam, no fluid wave  MSK no focal spinal tenderness, no joint edema Neuro: non-focal, well-oriented, appropriate affect Breasts: Deferred   Lab Results  Component Value Date   WBC 8.6 08/26/2019   HGB 15.7 08/26/2019   HCT 47.5 08/26/2019   MCV 89.6 08/26/2019   PLT 292 08/26/2019   Lab Results  Component Value Date   FERRITIN 11 (L) 08/26/2019   IRON 47 08/26/2019   TIBC 406 08/26/2019   UIBC 359 08/26/2019   IRONPCTSAT 12 (L) 08/26/2019   Lab Results  Component Value Date   RETICCTPCT 1.5 10/07/2019   RBC 5.27 10/07/2019   RETICCTABS 87.5 01/02/2011   No results found for: KPAFRELGTCHN, LAMBDASER, KAPLAMBRATIO No results found for: IGGSERUM, IGA, IGMSERUM No results found for: TOTALPROTELP, ALBUMINELP, A1GS,  Nelida Meuse, SPEI   Chemistry      Component Value Date/Time   NA 132 (L) 10/07/2019 1117   NA 136 08/19/2017 1149   NA 135 (L) 10/21/2016 1151   K 3.9 10/07/2019 1117   K 3.8 08/19/2017 1149   K 3.9 10/21/2016 1151   CL 97 (L) 10/07/2019 1117   CL 95 (L) 08/19/2017 1149   CO2 28 10/07/2019 1117   CO2 28 08/19/2017 1149   CO2 25 10/21/2016 1151   BUN 18 10/07/2019 1117   BUN 14 08/19/2017 1149   BUN 13.6 10/21/2016 1151   CREATININE 0.85 10/07/2019 1117   CREATININE 1.0 08/19/2017 1149   CREATININE  0.8 10/21/2016 1151      Component Value Date/Time   CALCIUM 10.2 10/07/2019 1117   CALCIUM 10.1 08/19/2017 1149   CALCIUM 10.3 10/21/2016 1151   ALKPHOS 56 10/07/2019 1117   ALKPHOS 68 08/19/2017 1149   ALKPHOS 68 10/21/2016 1151   AST 17 10/07/2019 1117   AST 25 10/21/2016 1151   ALT 20 10/07/2019 1117   ALT 32 08/19/2017 1149   ALT 38 10/21/2016 1151   BILITOT 1.0 10/07/2019 1117   BILITOT 1.22 (H) 10/21/2016 1151       Impression and Plan: Mr. Walling is a very pleasant 79yo caucasian gentleman with polycythemia, JAK-2 negative. He did get a phlebotomy today for Hct 47.3%.  We will see him back in another 6 weeks.  He will contact our office with any questions or concerns. We can certainly see him sooner if needed.   Laverna Peace, NP 1/14/202111:54 AM

## 2019-10-07 NOTE — Patient Instructions (Signed)
Therapeutic Phlebotomy Therapeutic phlebotomy is the planned removal of blood from a person's body for the purpose of treating a medical condition. The procedure is similar to donating blood. Usually, about a pint (470 mL, or 0.47 L) of blood is removed. The average adult has 9-12 pints (4.3-5.7 L) of blood in the body. Therapeutic phlebotomy may be used to treat the following medical conditions:  Hemochromatosis. This is a condition in which the blood contains too much iron.  Polycythemia vera. This is a condition in which the blood contains too many red blood cells.  Porphyria cutanea tarda. This is a disease in which an important part of hemoglobin is not made properly. It results in the buildup of abnormal amounts of porphyrins in the body.  Sickle cell disease. This is a condition in which the red blood cells form an abnormal crescent shape rather than a round shape. Tell a health care provider about:  Any allergies you have.  All medicines you are taking, including vitamins, herbs, eye drops, creams, and over-the-counter medicines.  Any problems you or family members have had with anesthetic medicines.  Any blood disorders you have.  Any surgeries you have had.  Any medical conditions you have.  Whether you are pregnant or may be pregnant. What are the risks? Generally, this is a safe procedure. However, problems may occur, including:  Nausea or light-headedness.  Low blood pressure (hypotension).  Soreness, bleeding, swelling, or bruising at the needle insertion site.  Infection. What happens before the procedure?  Follow instructions from your health care provider about eating or drinking restrictions.  Ask your health care provider about: ? Changing or stopping your regular medicines. This is especially important if you are taking diabetes medicines or blood thinners (anticoagulants). ? Taking medicines such as aspirin and ibuprofen. These medicines can thin your  blood. Do not take these medicines unless your health care provider tells you to take them. ? Taking over-the-counter medicines, vitamins, herbs, and supplements.  Wear clothing with sleeves that can be raised above the elbow.  Plan to have someone take you home from the hospital or clinic.  You may have a blood sample taken.  Your blood pressure, pulse rate, and breathing rate will be measured. What happens during the procedure?   To lower your risk of infection: ? Your health care team will wash or sanitize their hands. ? Your skin will be cleaned with an antiseptic.  You may be given a medicine to numb the area (local anesthetic).  A tourniquet will be placed on your arm.  A needle will be inserted into one of your veins.  Tubing and a collection bag will be attached to that needle.  Blood will flow through the needle and tubing into the collection bag.  The collection bag will be placed lower than your arm to allow gravity to help the flow of blood into the bag.  You may be asked to open and close your hand slowly and continually during the entire collection.  After the specified amount of blood has been removed from your body, the collection bag and tubing will be clamped.  The needle will be removed from your vein.  Pressure will be held on the site of the needle insertion to stop the bleeding.  A bandage (dressing) will be placed over the needle insertion site. The procedure may vary among health care providers and hospitals. What happens after the procedure?  Your blood pressure, pulse rate, and breathing rate will be   measured after the procedure.  You will be encouraged to drink fluids.  Your recovery will be assessed and monitored.  You can return to your normal activities as told by your health care provider. Summary  Therapeutic phlebotomy is the planned removal of blood from a person's body for the purpose of treating a medical condition.  Therapeutic  phlebotomy may be used to treat hemochromatosis, polycythemia vera, porphyria cutanea tarda, or sickle cell disease.  In the procedure, a needle is inserted and about a pint (470 mL, or 0.47 L) of blood is removed. The average adult has 9-12 pints (4.3-5.7 L) of blood in the body.  This is generally a safe procedure, but it can sometimes cause problems such as nausea, light-headedness, or low blood pressure (hypotension). This information is not intended to replace advice given to you by your health care provider. Make sure you discuss any questions you have with your health care provider. Document Revised: 09/25/2017 Document Reviewed: 09/25/2017 Elsevier Patient Education  2020 [employer]  

## 2019-10-08 LAB — IRON AND TIBC
Iron: 53 ug/dL (ref 42–163)
Saturation Ratios: 14 % — ABNORMAL LOW (ref 20–55)
TIBC: 385 ug/dL (ref 202–409)
UIBC: 332 ug/dL (ref 117–376)

## 2019-10-08 LAB — FERRITIN: Ferritin: 13 ng/mL — ABNORMAL LOW (ref 24–336)

## 2019-11-18 ENCOUNTER — Encounter: Payer: Self-pay | Admitting: Family

## 2019-11-18 ENCOUNTER — Inpatient Hospital Stay (HOSPITAL_BASED_OUTPATIENT_CLINIC_OR_DEPARTMENT_OTHER): Payer: Medicare Other | Admitting: Family

## 2019-11-18 ENCOUNTER — Inpatient Hospital Stay: Payer: Medicare Other | Attending: Family

## 2019-11-18 ENCOUNTER — Other Ambulatory Visit: Payer: Self-pay

## 2019-11-18 ENCOUNTER — Inpatient Hospital Stay: Payer: Medicare Other

## 2019-11-18 VITALS — BP 126/72 | HR 89 | Temp 97.8°F | Resp 18 | Ht 66.0 in | Wt 157.1 lb

## 2019-11-18 VITALS — BP 121/61

## 2019-11-18 DIAGNOSIS — D45 Polycythemia vera: Secondary | ICD-10-CM

## 2019-11-18 DIAGNOSIS — D751 Secondary polycythemia: Secondary | ICD-10-CM | POA: Diagnosis not present

## 2019-11-18 DIAGNOSIS — R5383 Other fatigue: Secondary | ICD-10-CM | POA: Insufficient documentation

## 2019-11-18 DIAGNOSIS — D500 Iron deficiency anemia secondary to blood loss (chronic): Secondary | ICD-10-CM | POA: Insufficient documentation

## 2019-11-18 LAB — RETICULOCYTES
Immature Retic Fract: 13.5 % (ref 2.3–15.9)
RBC.: 5.35 MIL/uL (ref 4.22–5.81)
Retic Count, Absolute: 67.9 10*3/uL (ref 19.0–186.0)
Retic Ct Pct: 1.3 % (ref 0.4–3.1)

## 2019-11-18 LAB — CMP (CANCER CENTER ONLY)
ALT: 18 U/L (ref 0–44)
AST: 16 U/L (ref 15–41)
Albumin: 4.3 g/dL (ref 3.5–5.0)
Alkaline Phosphatase: 54 U/L (ref 38–126)
Anion gap: 6 (ref 5–15)
BUN: 18 mg/dL (ref 8–23)
CO2: 30 mmol/L (ref 22–32)
Calcium: 10.5 mg/dL — ABNORMAL HIGH (ref 8.9–10.3)
Chloride: 100 mmol/L (ref 98–111)
Creatinine: 0.86 mg/dL (ref 0.61–1.24)
GFR, Est AFR Am: >60 mL/min (ref >60)
GFR, Estimated: >60 mL/min (ref >60)
Glucose, Bld: 124 mg/dL — ABNORMAL HIGH (ref 70–99)
Potassium: 3.8 mmol/L (ref 3.5–5.1)
Sodium: 136 mmol/L (ref 135–145)
Total Bilirubin: 1 mg/dL (ref 0.3–1.2)
Total Protein: 7 g/dL (ref 6.5–8.1)

## 2019-11-18 LAB — CBC WITH DIFFERENTIAL (CANCER CENTER ONLY)
Abs Immature Granulocytes: 0.05 10*3/uL (ref 0.00–0.07)
Basophils Absolute: 0 10*3/uL (ref 0.0–0.1)
Basophils Relative: 0 %
Eosinophils Absolute: 0.1 10*3/uL (ref 0.0–0.5)
Eosinophils Relative: 1 %
HCT: 46.5 % (ref 39.0–52.0)
Hemoglobin: 14.7 g/dL (ref 13.0–17.0)
Immature Granulocytes: 1 %
Lymphocytes Relative: 28 %
Lymphs Abs: 2 10*3/uL (ref 0.7–4.0)
MCH: 27.4 pg (ref 26.0–34.0)
MCHC: 31.6 g/dL (ref 30.0–36.0)
MCV: 86.6 fL (ref 80.0–100.0)
Monocytes Absolute: 0.8 10*3/uL (ref 0.1–1.0)
Monocytes Relative: 11 %
Neutro Abs: 4.1 10*3/uL (ref 1.7–7.7)
Neutrophils Relative %: 59 %
Platelet Count: 325 10*3/uL (ref 150–400)
RBC: 5.37 MIL/uL (ref 4.22–5.81)
RDW: 15 % (ref 11.5–15.5)
WBC Count: 7.1 10*3/uL (ref 4.0–10.5)
nRBC: 0 % (ref 0.0–0.2)

## 2019-11-18 NOTE — Progress Notes (Signed)
[id number] 16g PIV to left AC, removed      cc of blood. Pt tolerated procedure with no difficulty. Snacks and hydration provided for pt.  Observed x30 minutes.

## 2019-11-18 NOTE — Progress Notes (Signed)
Hematology and Oncology Follow Up Visit  Jesse [name] [id number] Feb 07, 1941 79 y.o. 11/18/2019   Principle Diagnosis:  Polycythemia - JAK2 negative  Current Therapy:   Phlebotomy to maintain hematocrit below 45% Aspirin 81 mg by mouth daily   Interim History:  Jesse [name] is here today for follow-up. He is feeling fatigued at times and complexion is [place] today on exam.  No fever, chills, n/v, cough, rash, dizziness, SOB, chest pain, palpitations, abdominal pain or changes in bowel or bladder habits.  No swelling, numbness or tingling in his extremities.  He has generalized joint aches and pains that he feels are due to arthritis.  No falls or syncopal episodes to report.  No episodes of bleeding to report. He does bruise easily on aspirin.  He is eating well and staying hydrated. His weight is stable.   ECOG Performance Status: 1 - Symptomatic but completely ambulatory  Medications:  Allergies as of 11/18/2019   No Known Allergies     Medication List       Accurate as of November 18, 2019 11:52 AM. If you have any questions, ask your nurse or doctor.        ALPRAZolam 1 MG tablet Commonly known as: XANAX Take 1 mg by mouth as needed. Take half tablet, a total of 0.5 mg at night as needed for sleep.   aspirin EC 81 MG tablet Take 81 mg by mouth daily.   cholecalciferol 1000 units tablet Commonly known as: VITAMIN D Take 1,000 Units by mouth daily.   fish oil-omega-3 fatty acids 1000 MG capsule Take 1 g by mouth daily.   lisinopril-hydrochlorothiazide 20-12.5 MG tablet Commonly known as: ZESTORETIC Take 1 tablet by mouth daily.   simvastatin 20 MG tablet Commonly known as: ZOCOR   traZODone 50 MG tablet Commonly known as: DESYREL Take 1 tablet by mouth at bedtime.   vitamin B-12 1000 MCG tablet Commonly known as: CYANOCOBALAMIN Take 1,000 mcg by mouth once a week.       Allergies: No Known Allergies  Past Medical History, Surgical history, Social  history, and Family History were reviewed and updated.  Review of Systems: All other 10 point review of systems is negative.   Physical Exam:  vitals were not taken for this visit.   Wt Readings from Last 3 Encounters:  10/07/19 157 lb (71.2 kg)  08/26/19 156 lb (70.8 kg)  07/16/19 155 lb (70.3 kg)    Ocular: Sclerae unicteric, pupils equal, round and reactive to light Ear-nose-throat: Oropharynx clear, dentition fair Lymphatic: No cervical or supraclavicular adenopathy Lungs no rales or rhonchi, good excursion bilaterally Heart regular rate and rhythm, no murmur appreciated Abd soft, nontender, positive bowel sounds, no liver or spleen tip palpated on exam, no fluid wave  MSK no focal spinal tenderness, no joint edema Neuro: non-focal, well-oriented, appropriate affect Breasts: Deferred   Lab Results  Component Value Date   WBC 7.1 11/18/2019   HGB 14.7 11/18/2019   HCT 46.5 11/18/2019   MCV 86.6 11/18/2019   PLT 325 11/18/2019   Lab Results  Component Value Date   FERRITIN 13 (L) 10/07/2019   IRON 53 10/07/2019   TIBC 385 10/07/2019   UIBC 332 10/07/2019   IRONPCTSAT 14 (L) 10/07/2019   Lab Results  Component Value Date   RETICCTPCT 1.3 11/18/2019   RBC 5.35 11/18/2019   RETICCTABS 87.5 01/02/2011   No results found for: KPAFRELGTCHN, LAMBDASER, KAPLAMBRATIO No results found for: IGGSERUM, IGA, IGMSERUM No results found  for: Odetta Pink, SPEI   Chemistry      Component Value Date/Time   NA 132 (L) 10/07/2019 1117   NA 136 08/19/2017 1149   NA 135 (L) 10/21/2016 1151   K 3.9 10/07/2019 1117   K 3.8 08/19/2017 1149   K 3.9 10/21/2016 1151   CL 97 (L) 10/07/2019 1117   CL 95 (L) 08/19/2017 1149   CO2 28 10/07/2019 1117   CO2 28 08/19/2017 1149   CO2 25 10/21/2016 1151   BUN 18 10/07/2019 1117   BUN 14 08/19/2017 1149   BUN 13.6 10/21/2016 1151   CREATININE 0.85 10/07/2019 1117   CREATININE 1.0  08/19/2017 1149   CREATININE 0.8 10/21/2016 1151      Component Value Date/Time   CALCIUM 10.2 10/07/2019 1117   CALCIUM 10.1 08/19/2017 1149   CALCIUM 10.3 10/21/2016 1151   ALKPHOS 56 10/07/2019 1117   ALKPHOS 68 08/19/2017 1149   ALKPHOS 68 10/21/2016 1151   AST 17 10/07/2019 1117   AST 25 10/21/2016 1151   ALT 20 10/07/2019 1117   ALT 32 08/19/2017 1149   ALT 38 10/21/2016 1151   BILITOT 1.0 10/07/2019 1117   BILITOT 1.22 (H) 10/21/2016 1151       Impression and Plan: Jesse [name] is a very pleasant 79yo caucasian gentleman with polycythemia, JAK-2 negative. We will proceed with phlebotomy today as planned for Hct 46.5%.  We will see him again in 6 weeks.  He will contact our office with any questions or concerns. We can certainly see him sooner if needed.   Laverna Peace, NP 2/25/202111:52 AM

## 2019-11-18 NOTE — Patient Instructions (Signed)
Therapeutic Phlebotomy Therapeutic phlebotomy is the planned removal of blood from a person's body for the purpose of treating a medical condition. The procedure is similar to donating blood. Usually, about a pint (470 mL, or 0.47 L) of blood is removed. The average adult has 9-12 pints (4.3-5.7 L) of blood in the body. Therapeutic phlebotomy may be used to treat the following medical conditions:  Hemochromatosis. This is a condition in which the blood contains too much iron.  Polycythemia vera. This is a condition in which the blood contains too many red blood cells.  Porphyria cutanea tarda. This is a disease in which an important part of hemoglobin is not made properly. It results in the buildup of abnormal amounts of porphyrins in the body.  Sickle cell disease. This is a condition in which the red blood cells form an abnormal crescent shape rather than a round shape. Tell a health care provider about:  Any allergies you have.  All medicines you are taking, including vitamins, herbs, eye drops, creams, and over-the-counter medicines.  Any problems you or family members have had with anesthetic medicines.  Any blood disorders you have.  Any surgeries you have had.  Any medical conditions you have.  Whether you are pregnant or may be pregnant. What are the risks? Generally, this is a safe procedure. However, problems may occur, including:  Nausea or light-headedness.  Low blood pressure (hypotension).  Soreness, bleeding, swelling, or bruising at the needle insertion site.  Infection. What happens before the procedure?  Follow instructions from your health care provider about eating or drinking restrictions.  Ask your health care provider about: ? Changing or stopping your regular medicines. This is especially important if you are taking diabetes medicines or blood thinners (anticoagulants). ? Taking medicines such as aspirin and ibuprofen. These medicines can thin your  blood. Do not take these medicines unless your health care provider tells you to take them. ? Taking over-the-counter medicines, vitamins, herbs, and supplements.  Wear clothing with sleeves that can be raised above the elbow.  Plan to have someone take you home from the hospital or clinic.  You may have a blood sample taken.  Your blood pressure, pulse rate, and breathing rate will be measured. What happens during the procedure?   To lower your risk of infection: ? Your health care team will wash or sanitize their hands. ? Your skin will be cleaned with an antiseptic.  You may be given a medicine to numb the area (local anesthetic).  A tourniquet will be placed on your arm.  A needle will be inserted into one of your veins.  Tubing and a collection bag will be attached to that needle.  Blood will flow through the needle and tubing into the collection bag.  The collection bag will be placed lower than your arm to allow gravity to help the flow of blood into the bag.  You may be asked to open and close your hand slowly and continually during the entire collection.  After the specified amount of blood has been removed from your body, the collection bag and tubing will be clamped.  The needle will be removed from your vein.  Pressure will be held on the site of the needle insertion to stop the bleeding.  A bandage (dressing) will be placed over the needle insertion site. The procedure may vary among health care providers and hospitals. What happens after the procedure?  Your blood pressure, pulse rate, and breathing rate will be   measured after the procedure.  You will be encouraged to drink fluids.  Your recovery will be assessed and monitored.  You can return to your normal activities as told by your health care provider. Summary  Therapeutic phlebotomy is the planned removal of blood from a person's body for the purpose of treating a medical condition.  Therapeutic  phlebotomy may be used to treat hemochromatosis, polycythemia vera, porphyria cutanea tarda, or sickle cell disease.  In the procedure, a needle is inserted and about a pint (470 mL, or 0.47 L) of blood is removed. The average adult has 9-12 pints (4.3-5.7 L) of blood in the body.  This is generally a safe procedure, but it can sometimes cause problems such as nausea, light-headedness, or low blood pressure (hypotension). This information is not intended to replace advice given to you by your health care provider. Make sure you discuss any questions you have with your health care provider. Document Revised: 09/25/2017 Document Reviewed: 09/25/2017 Elsevier Patient Education  2020 [employer]  

## 2019-11-19 LAB — IRON AND TIBC
Iron: 54 ug/dL (ref 42–163)
Saturation Ratios: 14 % — ABNORMAL LOW (ref 20–55)
TIBC: 391 ug/dL (ref 202–409)
UIBC: 338 ug/dL (ref 117–376)

## 2019-11-19 LAB — FERRITIN: Ferritin: 15 ng/mL — ABNORMAL LOW (ref 24–336)

## 2019-12-30 ENCOUNTER — Ambulatory Visit: Payer: Medicare Other | Admitting: Podiatry

## 2019-12-30 ENCOUNTER — Other Ambulatory Visit: Payer: Self-pay

## 2019-12-30 ENCOUNTER — Other Ambulatory Visit: Payer: Self-pay | Admitting: Podiatry

## 2019-12-30 ENCOUNTER — Ambulatory Visit (INDEPENDENT_AMBULATORY_CARE_PROVIDER_SITE_OTHER): Payer: Medicare Other

## 2019-12-30 ENCOUNTER — Encounter: Payer: Self-pay | Admitting: Podiatry

## 2019-12-30 DIAGNOSIS — G5792 Unspecified mononeuropathy of left lower limb: Secondary | ICD-10-CM

## 2019-12-30 DIAGNOSIS — G5791 Unspecified mononeuropathy of right lower limb: Secondary | ICD-10-CM

## 2019-12-30 DIAGNOSIS — M7750 Other enthesopathy of unspecified foot: Secondary | ICD-10-CM

## 2019-12-30 NOTE — Progress Notes (Signed)
Subjective:  Patient ID: Jesse [name], male    DOB: [birth year],  MRN: [id number] HPI Chief Complaint  Patient presents with  . Leg Pain    Lateral left calf - aching x 1 month, notices it tingles from the foot to the leg, swelling some, right sometimes feels that way  . New Patient (Initial Visit)    79 y.o. male presents with the above complaint.   ROS: Denies fever chills nausea vomiting muscle aches pains calf pain back pain chest pain shortness of breath.  Past Medical History:  Diagnosis Date  . Depression   . Hypercholesteremia   . Hypertension   . HZ (herpes zoster) 09/02/2011  . Polycythemia rubra vera ([place]) 09/02/2011   Past Surgical History:  Procedure Laterality Date  . GAS INSERTION  06/04/2012   Procedure: INSERTION OF GAS;  Surgeon: Hayden Pedro, MD;  Location: [facility];  Service: Ophthalmology;  Laterality: Left;  [id number]  . SCLERAL BUCKLE  06/04/2012   Procedure: SCLERAL BUCKLE;  Surgeon: Hayden Pedro, MD;  Location: [place];  Service: Ophthalmology;  Laterality: Left;  Headscope laser    Current Outpatient Medications:  .  ALPRAZolam (XANAX) 1 MG tablet, Take 1 mg by mouth as needed. Take half tablet, a total of 0.5 mg at night as needed for sleep., Disp: , Rfl: 3 .  aspirin EC 81 MG tablet, Take 81 mg by mouth daily., Disp: , Rfl:  .  cholecalciferol (VITAMIN D) 1000 UNITS tablet, Take 1,000 Units by mouth daily.  , Disp: , Rfl:  .  fish oil-omega-3 fatty acids 1000 MG capsule, Take 1 g by mouth daily. , Disp: , Rfl:  .  lisinopril-hydrochlorothiazide (PRINZIDE,ZESTORETIC) 20-12.5 MG per tablet, Take 1 tablet by mouth daily.  , Disp: , Rfl:  .  simvastatin (ZOCOR) 20 MG tablet, , Disp: , Rfl:  .  traZODone (DESYREL) 50 MG tablet, Take 1 tablet by mouth at bedtime., Disp: , Rfl:  .  vitamin B-12 (CYANOCOBALAMIN) 1000 MCG tablet, Take 1,000 mcg by mouth once a week., Disp: , Rfl:  .  vitamin E (VITAMIN E) 1000 UNIT capsule, Take 1,000 Units by mouth daily., Disp:  , Rfl:   No Known Allergies Review of Systems Objective:  There were no vitals filed for this visit.  General: Well developed, nourished, in no acute distress, alert and oriented x3   Dermatological: Skin is warm, dry and supple bilateral. Nails x 10 are well maintained; remaining integument appears unremarkable at this time. There are no open sores, no preulcerative lesions, no rash or signs of infection present.  Vascular: Dorsalis Pedis artery and Posterior Tibial artery pedal pulses are 2/4 bilateral with immedate capillary fill time. Pedal hair growth present. No varicosities and no lower extremity edema present bilateral.   Neruologic: Grossly intact via light touch bilateral. Vibratory intact via tuning fork bilateral. Protective threshold with Semmes Wienstein monofilament intact to all pedal sites bilateral. Patellar and Achilles deep tendon reflexes 2+ bilateral. No Babinski or clonus noted bilateral.   Musculoskeletal: No gross boney pedal deformities bilateral. No pain, crepitus, or limitation noted with foot and ankle range of motion bilateral. Muscular strength 5/5 in all groups tested bilateral.  Reproducible pain on palpation of the peroneal nerve of the first and metatarsal space right foot.  Radiating pain proximally.  Gait: Unassisted, Nonantalgic.    Radiographs:  Radiographs taken today demonstrate only osteoarthritis of the first metatarsophalangeal joints bilateral left greater than right.  Dorsal spurring subchondral  sclerosis joint space narrowing is noted.  No ankle issues.  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  Plan: I injected into the point of maximal tenderness distally today 20 mg of Kenalog 5 mg of Marcaine point of maximal tenderness first intermetatarsal space at the bifurcation of the digital portion of the deep peroneal nerve branch.  This  alleviated his symptoms.     Somer Trotter T. [place], [place]

## 2019-12-31 ENCOUNTER — Other Ambulatory Visit: Payer: Medicare Other

## 2019-12-31 ENCOUNTER — Ambulatory Visit: Payer: Medicare Other | Admitting: Hematology & Oncology

## 2020-01-06 ENCOUNTER — Ambulatory Visit: Payer: Medicare Other | Admitting: Podiatry

## 2020-01-06 ENCOUNTER — Encounter: Payer: Self-pay | Admitting: Podiatry

## 2020-01-06 ENCOUNTER — Other Ambulatory Visit: Payer: Self-pay

## 2020-01-06 DIAGNOSIS — G5792 Unspecified mononeuropathy of left lower limb: Secondary | ICD-10-CM

## 2020-01-06 NOTE — Progress Notes (Signed)
He presents today for follow-up of his deep peroneal nerve neuritis states that well really did not get any better after the last injection.  Maybe they want another cortisone in it.  Objective: Vital signs are stable he is alert and oriented x3.  Pulses are palpable.  He has pain on palpation of the deep peroneal nerve right at the bifurcation of the first and second metatarsals.  Assessment: Deep peroneal nerve neuritis.  Plan: Reinjected the area today with 20 mg Kenalog 5 mg Marcaine for maximal tenderness.  Tolerated procedure well without complications follow-up with him in 1 month if necessary.

## 2020-01-11 ENCOUNTER — Other Ambulatory Visit: Payer: Self-pay

## 2020-01-11 ENCOUNTER — Telehealth: Payer: Self-pay | Admitting: Hematology & Oncology

## 2020-01-11 ENCOUNTER — Inpatient Hospital Stay: Payer: Medicare Other | Attending: Family

## 2020-01-11 ENCOUNTER — Inpatient Hospital Stay (HOSPITAL_BASED_OUTPATIENT_CLINIC_OR_DEPARTMENT_OTHER): Payer: Medicare Other | Admitting: Hematology & Oncology

## 2020-01-11 ENCOUNTER — Inpatient Hospital Stay: Payer: Medicare Other

## 2020-01-11 VITALS — BP 131/81 | HR 72 | Resp 16

## 2020-01-11 VITALS — BP 133/85 | HR 84 | Temp 97.5°F | Resp 16 | Wt 154.0 lb

## 2020-01-11 DIAGNOSIS — D45 Polycythemia vera: Secondary | ICD-10-CM | POA: Diagnosis not present

## 2020-01-11 DIAGNOSIS — E611 Iron deficiency: Secondary | ICD-10-CM | POA: Diagnosis not present

## 2020-01-11 DIAGNOSIS — D751 Secondary polycythemia: Secondary | ICD-10-CM | POA: Diagnosis not present

## 2020-01-11 DIAGNOSIS — D500 Iron deficiency anemia secondary to blood loss (chronic): Secondary | ICD-10-CM

## 2020-01-11 LAB — CMP (CANCER CENTER ONLY)
ALT: 17 U/L (ref 0–44)
AST: 16 U/L (ref 15–41)
Albumin: 4.5 g/dL (ref 3.5–5.0)
Alkaline Phosphatase: 53 U/L (ref 38–126)
Anion gap: 8 (ref 5–15)
BUN: 16 mg/dL (ref 8–23)
CO2: 27 mmol/L (ref 22–32)
Calcium: 10.4 mg/dL — ABNORMAL HIGH (ref 8.9–10.3)
Chloride: 96 mmol/L — ABNORMAL LOW (ref 98–111)
Creatinine: 0.83 mg/dL (ref 0.61–1.24)
GFR, Est AFR Am: >60 mL/min (ref >60)
GFR, Estimated: >60 mL/min (ref >60)
Glucose, Bld: 106 mg/dL — ABNORMAL HIGH (ref 70–99)
Potassium: 3.7 mmol/L (ref 3.5–5.1)
Sodium: 131 mmol/L — ABNORMAL LOW (ref 135–145)
Total Bilirubin: 1.7 mg/dL — ABNORMAL HIGH (ref 0.3–1.2)
Total Protein: 7.3 g/dL (ref 6.5–8.1)

## 2020-01-11 LAB — CBC WITH DIFFERENTIAL (CANCER CENTER ONLY)
Abs Immature Granulocytes: 0.04 10*3/uL (ref 0.00–0.07)
Basophils Absolute: 0 10*3/uL (ref 0.0–0.1)
Basophils Relative: 0 %
Eosinophils Absolute: 0 10*3/uL (ref 0.0–0.5)
Eosinophils Relative: 0 %
HCT: 47.7 % (ref 39.0–52.0)
Hemoglobin: 15.1 g/dL (ref 13.0–17.0)
Immature Granulocytes: 1 %
Lymphocytes Relative: 23 %
Lymphs Abs: 1.7 10*3/uL (ref 0.7–4.0)
MCH: 26.9 pg (ref 26.0–34.0)
MCHC: 31.7 g/dL (ref 30.0–36.0)
MCV: 84.9 fL (ref 80.0–100.0)
Monocytes Absolute: 1 10*3/uL (ref 0.1–1.0)
Monocytes Relative: 13 %
Neutro Abs: 4.7 10*3/uL (ref 1.7–7.7)
Neutrophils Relative %: 63 %
Platelet Count: 317 10*3/uL (ref 150–400)
RBC: 5.62 MIL/uL (ref 4.22–5.81)
RDW: 16.2 % — ABNORMAL HIGH (ref 11.5–15.5)
WBC Count: 7.4 10*3/uL (ref 4.0–10.5)
nRBC: 0 % (ref 0.0–0.2)

## 2020-01-11 LAB — IRON AND TIBC
Iron: 113 ug/dL (ref 42–163)
Saturation Ratios: 27 % (ref 20–55)
TIBC: 418 ug/dL — ABNORMAL HIGH (ref 202–409)
UIBC: 305 ug/dL (ref 117–376)

## 2020-01-11 LAB — RETICULOCYTES
Immature Retic Fract: 13.9 % (ref 2.3–15.9)
RBC.: 5.68 MIL/uL (ref 4.22–5.81)
Retic Count, Absolute: 86.9 10*3/uL (ref 19.0–186.0)
Retic Ct Pct: 1.5 % (ref 0.4–3.1)

## 2020-01-11 LAB — FERRITIN: Ferritin: 15 ng/mL — ABNORMAL LOW (ref 24–336)

## 2020-01-11 NOTE — Progress Notes (Signed)
Hematology and Oncology Follow Up Visit  BODI LUMBARD [name]:[id number] Dec 26, 1940 79 y.o. 01/11/2020   Principle Diagnosis:  Polycythemia vera - JAK2 negative  Current Therapy:   Phlebotomy to maintain hematocrit below 45% Aspirin 81 mg by mouth daily   Interim History: [name] is here today for follow-up.  Overall, he is doing pretty well.  He does have his coronavirus vaccines.  He has had no problems with nausea or vomiting.  There is been no headaches.  He has had no pain in his hands or feet.  His iron studies when we last saw him showed a ferritin of 15 with iron saturation of 14%.  He is eating well.  He has had no change in bowel or bladder habits.  There has been no rashes.  He has had no fever.  He has had no cough or shortness of breath.  Overall, his performance status is ECOG 1.  MEDICATIONS  Allergies: No Known Allergies  Past Medical History, Surgical history, Social history, and Family History were reviewed and updated.  Review of Systems: Review of Systems  Constitutional: Negative.   HENT: Negative.   Eyes: Negative.   Respiratory: Negative.   Cardiovascular: Negative.   Gastrointestinal: Negative.   Genitourinary: Negative.   Musculoskeletal: Negative.   Skin: Negative.   Neurological: Negative.   Endo/Heme/Allergies: Negative.   Psychiatric/Behavioral: Negative.       Physical Exam:  weight is 154 lb (69.9 kg). His temporal temperature is 97.5 F (36.4 C) (abnormal). His blood pressure is 133/85 and his pulse is 84. His respiration is 16 and oxygen saturation is 98%.   Wt Readings from Last 3 Encounters:  01/11/20 154 lb (69.9 kg)  11/18/19 157 lb 1.9 oz (71.3 kg)  10/07/19 157 lb (71.2 kg)    Physical Exam Vitals reviewed.  HENT:     Head: Normocephalic and atraumatic.  Eyes:     Pupils: Pupils are equal, round, and reactive to light.  Cardiovascular:     Rate and Rhythm: Normal rate and regular rhythm.     Heart sounds: Normal heart  sounds.  Pulmonary:     Effort: Pulmonary effort is normal.     Breath sounds: Normal breath sounds.  Abdominal:     General: Bowel sounds are normal.     Palpations: Abdomen is soft.  Musculoskeletal:        General: No tenderness or deformity. Normal range of motion.     Cervical back: Normal range of motion.  Lymphadenopathy:     Cervical: No cervical adenopathy.  Skin:    General: Skin is warm and dry.     Findings: No erythema or rash.  Neurological:     Mental Status: He is alert and oriented to person, place, and time.  Psychiatric:        Behavior: Behavior normal.        Thought Content: Thought content normal.        Judgment: Judgment normal.      Lab Results  Component Value Date   WBC 7.4 01/11/2020   HGB 15.1 01/11/2020   HCT 47.7 01/11/2020   MCV 84.9 01/11/2020   PLT 317 01/11/2020   Lab Results  Component Value Date   FERRITIN 15 (L) 11/18/2019   IRON 54 11/18/2019   TIBC 391 11/18/2019   UIBC 338 11/18/2019   IRONPCTSAT 14 (L) 11/18/2019   Lab Results  Component Value Date   RETICCTPCT 1.5 01/11/2020   RBC 5.68 01/11/2020  RBC 5.62 01/11/2020   RETICCTABS 87.5 01/02/2011   No results found for: KPAFRELGTCHN, LAMBDASER, KAPLAMBRATIO No results found for: Jesse [name], IGMSERUM No results found for: Jesse [name], MSPIKE, SPEI   Chemistry      Component Value Date/Time   NA 131 (L) 01/11/2020 0944   NA 136 08/19/2017 1149   NA 135 (L) 10/21/2016 1151   K 3.7 01/11/2020 0944   K 3.8 08/19/2017 1149   K 3.9 10/21/2016 1151   CL 96 (L) 01/11/2020 0944   CL 95 (L) 08/19/2017 1149   CO2 27 01/11/2020 0944   CO2 28 08/19/2017 1149   CO2 25 10/21/2016 1151   BUN 16 01/11/2020 0944   BUN 14 08/19/2017 1149   BUN 13.6 10/21/2016 1151   CREATININE 0.83 01/11/2020 0944   CREATININE 1.0 08/19/2017 1149   CREATININE 0.8 10/21/2016 1151      Component Value Date/Time   CALCIUM 10.4 (H) 01/11/2020  0944   CALCIUM 10.1 08/19/2017 1149   CALCIUM 10.3 10/21/2016 1151   ALKPHOS 53 01/11/2020 0944   ALKPHOS 68 08/19/2017 1149   ALKPHOS 68 10/21/2016 1151   AST 16 01/11/2020 0944   AST 25 10/21/2016 1151   ALT 17 01/11/2020 0944   ALT 32 08/19/2017 1149   ALT 38 10/21/2016 1151   BILITOT 1.7 (H) 01/11/2020 0944   BILITOT 1.22 (H) 10/21/2016 1151      Impression and Plan: [name] is a very pleasant 79 yo caucasian gentleman with polycythemia, JAK-2 negative.   We will phlebotomize him today.   I will plan to have him come back in 3 months.  I really think that we can spread his appointments out a little bit more.  He is iron deficient so I will think his blood should go up all that quickly  Jesse Dukes, MD 4/20/202110:21 AM

## 2020-01-11 NOTE — Patient Instructions (Signed)
Therapeutic Phlebotomy Therapeutic phlebotomy is the planned removal of blood from a person's body for the purpose of treating a medical condition. The procedure is similar to donating blood. Usually, about a pint (470 mL, or 0.47 L) of blood is removed. The average adult has 9-12 pints (4.3-5.7 L) of blood in the body. Therapeutic phlebotomy may be used to treat the following medical conditions:  Hemochromatosis. This is a condition in which the blood contains too much iron.  Polycythemia vera. This is a condition in which the blood contains too many red blood cells.  Porphyria cutanea tarda. This is a disease in which an important part of hemoglobin is not made properly. It results in the buildup of abnormal amounts of porphyrins in the body.  Sickle cell disease. This is a condition in which the red blood cells form an abnormal crescent shape rather than a round shape. Tell a health care provider about:  Any allergies you have.  All medicines you are taking, including vitamins, herbs, eye drops, creams, and over-the-counter medicines.  Any problems you or family members have had with anesthetic medicines.  Any blood disorders you have.  Any surgeries you have had.  Any medical conditions you have.  Whether you are pregnant or may be pregnant. What are the risks? Generally, this is a safe procedure. However, problems may occur, including:  Nausea or light-headedness.  Low blood pressure (hypotension).  Soreness, bleeding, swelling, or bruising at the needle insertion site.  Infection. What happens before the procedure?  Follow instructions from your health care provider about eating or drinking restrictions.  Ask your health care provider about: ? Changing or stopping your regular medicines. This is especially important if you are taking diabetes medicines or blood thinners (anticoagulants). ? Taking medicines such as aspirin and ibuprofen. These medicines can thin your  blood. Do not take these medicines unless your health care provider tells you to take them. ? Taking over-the-counter medicines, vitamins, herbs, and supplements.  Wear clothing with sleeves that can be raised above the elbow.  Plan to have someone take you home from the hospital or clinic.  You may have a blood sample taken.  Your blood pressure, pulse rate, and breathing rate will be measured. What happens during the procedure?   To lower your risk of infection: ? Your health care team will wash or sanitize their hands. ? Your skin will be cleaned with an antiseptic.  You may be given a medicine to numb the area (local anesthetic).  A tourniquet will be placed on your arm.  A needle will be inserted into one of your veins.  Tubing and a collection bag will be attached to that needle.  Blood will flow through the needle and tubing into the collection bag.  The collection bag will be placed lower than your arm to allow gravity to help the flow of blood into the bag.  You may be asked to open and close your hand slowly and continually during the entire collection.  After the specified amount of blood has been removed from your body, the collection bag and tubing will be clamped.  The needle will be removed from your vein.  Pressure will be held on the site of the needle insertion to stop the bleeding.  A bandage (dressing) will be placed over the needle insertion site. The procedure may vary among health care providers and hospitals. What happens after the procedure?  Your blood pressure, pulse rate, and breathing rate will be   measured after the procedure.  You will be encouraged to drink fluids.  Your recovery will be assessed and monitored.  You can return to your normal activities as told by your health care provider. Summary  Therapeutic phlebotomy is the planned removal of blood from a person's body for the purpose of treating a medical condition.  Therapeutic  phlebotomy may be used to treat hemochromatosis, polycythemia vera, porphyria cutanea tarda, or sickle cell disease.  In the procedure, a needle is inserted and about a pint (470 mL, or 0.47 L) of blood is removed. The average adult has 9-12 pints (4.3-5.7 L) of blood in the body.  This is generally a safe procedure, but it can sometimes cause problems such as nausea, light-headedness, or low blood pressure (hypotension). This information is not intended to replace advice given to you by your health care provider. Make sure you discuss any questions you have with your health care provider. Document Revised: 09/25/2017 Document Reviewed: 09/25/2017 Elsevier Patient Education  2020 [employer]  

## 2020-01-11 NOTE — Progress Notes (Signed)
Jesse [name] presents today for phlebotomy per MD orders. Phlebotomy procedure started at 1030 via 16 g phlebotomy kit and ended at 1040. 504 grams removed. Patient observed for 30 minutes after procedure without any incident. Patient tolerated procedure well. IV needle removed intact.

## 2020-01-11 NOTE — Telephone Encounter (Signed)
Appointments scheduled calendar printed per 4/20 los

## 2020-02-03 ENCOUNTER — Encounter: Payer: Self-pay | Admitting: Podiatry

## 2020-02-03 ENCOUNTER — Ambulatory Visit: Payer: Medicare Other | Admitting: Podiatry

## 2020-02-03 ENCOUNTER — Other Ambulatory Visit: Payer: Self-pay

## 2020-02-03 DIAGNOSIS — G5792 Unspecified mononeuropathy of left lower limb: Secondary | ICD-10-CM

## 2020-02-03 NOTE — Progress Notes (Signed)
He presents today for follow-up of his deep peroneal neuritis.  He states that the shot really did not make it that much better.  Objective: Vital signs are stable alert oriented x3.  There is pulse on palpation and he has pain on palpation of the first intermetatarsal space.  He also states that it radiates pain all the way up to his buttocks.  Assessment: Deep peroneal nerve neuritis could be a sciatica.  Plan: I injected his first dose of dehydrated alcohol to the deep peroneal nerve today I will follow-up with him in 1 month

## 2020-02-28 ENCOUNTER — Other Ambulatory Visit: Payer: Self-pay

## 2020-02-28 ENCOUNTER — Emergency Department (HOSPITAL_COMMUNITY)
Admission: EM | Admit: 2020-02-28 | Discharge: 2020-02-28 | Disposition: A | Discharge status: Home / Self Care | Payer: Medicare Other | Attending: Emergency Medicine | Admitting: Emergency Medicine

## 2020-02-28 ENCOUNTER — Encounter (HOSPITAL_COMMUNITY): Payer: Self-pay | Admitting: Emergency Medicine

## 2020-02-28 DIAGNOSIS — T40425A Adverse effect of tramadol, initial encounter: Secondary | ICD-10-CM | POA: Diagnosis not present

## 2020-02-28 DIAGNOSIS — R112 Nausea with vomiting, unspecified: Secondary | ICD-10-CM | POA: Insufficient documentation

## 2020-02-28 DIAGNOSIS — Z7982 Long term (current) use of aspirin: Secondary | ICD-10-CM | POA: Diagnosis not present

## 2020-02-28 DIAGNOSIS — I10 Essential (primary) hypertension: Secondary | ICD-10-CM | POA: Insufficient documentation

## 2020-02-28 DIAGNOSIS — Z79899 Other long term (current) drug therapy: Secondary | ICD-10-CM | POA: Insufficient documentation

## 2020-02-28 DIAGNOSIS — T887XXA Unspecified adverse effect of drug or medicament, initial encounter: Secondary | ICD-10-CM

## 2020-02-28 LAB — URINALYSIS, ROUTINE W REFLEX MICROSCOPIC
Bacteria, UA: NONE SEEN
Bilirubin Urine: NEGATIVE
Glucose, UA: NEGATIVE mg/dL
Ketones, ur: 20 mg/dL — AB
Leukocytes,Ua: NEGATIVE
Nitrite: NEGATIVE
Protein, ur: 30 mg/dL — AB
Specific Gravity, Urine: 1.019 (ref 1.005–1.030)
pH: 6 (ref 5.0–8.0)

## 2020-02-28 LAB — CBC
HCT: 44.9 % (ref 39.0–52.0)
Hemoglobin: 14.5 g/dL (ref 13.0–17.0)
MCH: 27.3 pg (ref 26.0–34.0)
MCHC: 32.3 g/dL (ref 30.0–36.0)
MCV: 84.6 fL (ref 80.0–100.0)
Platelets: 254 10*3/uL (ref 150–400)
RBC: 5.31 MIL/uL (ref 4.22–5.81)
RDW: 16.7 % — ABNORMAL HIGH (ref 11.5–15.5)
WBC: 16.6 10*3/uL — ABNORMAL HIGH (ref 4.0–10.5)
nRBC: 0 % (ref 0.0–0.2)

## 2020-02-28 LAB — COMPREHENSIVE METABOLIC PANEL
ALT: 32 U/L (ref 0–44)
AST: 85 U/L — ABNORMAL HIGH (ref 15–41)
Albumin: 3.7 g/dL (ref 3.5–5.0)
Alkaline Phosphatase: 46 U/L (ref 38–126)
Anion gap: 14 (ref 5–15)
BUN: 12 mg/dL (ref 8–23)
CO2: 23 mmol/L (ref 22–32)
Calcium: 9.4 mg/dL (ref 8.9–10.3)
Chloride: 88 mmol/L — ABNORMAL LOW (ref 98–111)
Creatinine, Ser: 0.63 mg/dL (ref 0.61–1.24)
GFR calc Af Amer: >60 mL/min (ref >60)
GFR calc non Af Amer: >60 mL/min (ref >60)
Glucose, Bld: 138 mg/dL — ABNORMAL HIGH (ref 70–99)
Potassium: 4.2 mmol/L (ref 3.5–5.1)
Sodium: 125 mmol/L — ABNORMAL LOW (ref 135–145)
Total Bilirubin: 2.1 mg/dL — ABNORMAL HIGH (ref 0.3–1.2)
Total Protein: 6.9 g/dL (ref 6.5–8.1)

## 2020-02-28 LAB — TROPONIN I (HIGH SENSITIVITY): Troponin I (High Sensitivity): 4 ng/L (ref <18)

## 2020-02-28 LAB — LIPASE, BLOOD: Lipase: 21 U/L (ref 11–51)

## 2020-02-28 MED ORDER — ONDANSETRON 4 MG PO TBDP: 4.0000 mg | ORAL_TABLET | Freq: Three times a day (TID) | ORAL | 0 refills | Status: DC | PRN

## 2020-02-28 MED ORDER — SODIUM CHLORIDE 0.9 % IV BOLUS
1000.0000 mL | Freq: Once | INTRAVENOUS | Status: AC
  Administered 2020-02-28: 1000 mL via INTRAVENOUS

## 2020-02-28 MED ORDER — ONDANSETRON HCL 4 MG/2ML IJ SOLN
4.0000 mg | Freq: Once | INTRAMUSCULAR | Status: AC
  Administered 2020-02-28: 4 mg via INTRAVENOUS
  Filled 2020-02-28: qty 2

## 2020-02-28 MED ORDER — KETOROLAC TROMETHAMINE 15 MG/ML IJ SOLN
15.0000 mg | Freq: Once | INTRAMUSCULAR | Status: AC
  Administered 2020-02-28: 15 mg via INTRAVENOUS
  Filled 2020-02-28: qty 1

## 2020-02-28 MED ORDER — SODIUM CHLORIDE 0.9% FLUSH: 3.0000 mL | Freq: Once | INTRAVENOUS | Status: DC

## 2020-02-28 NOTE — ED Provider Notes (Signed)
[facility] EMERGENCY DEPARTMENT Provider Note   CSN: [id number] Arrival date & time: 02/28/20  0617     History Chief Complaint  Patient presents with  . Emesis  . Back Pain    Jesse [name] is a 79 y.o. male with history of chronic sciatica nerve pain, hypertension, high cholesterol, polycythemia with regular phlebotomy, presented to emergency department with nausea vomiting.  Patient reports her doctor started him on tramadol yesterday evening.  He says the first time he has taken tramadol or really any narcotics that he remembers.  Patient reports that he has been vomiting most of the night into this morning.  He thinks that the tramadol "doesn't agree with me."  He denies any abdominal pain.  He denies any blood in his vomit.  Reports last bowel movement was yesterday.  Says he is passing gas.  He denies any dysuria or hematuria.  He denies any fevers or chills.  He denies any sick contacts in the house.  He lives with his sister who is mentally disabled, they have a caretaker to help some.  He did receive both doses of the Pfizer vaccine.  No hx of MI or cardiac stents  HPI     Past Medical History:  Diagnosis Date  . Depression   . Hypercholesteremia   . Hypertension   . HZ (herpes zoster) 09/02/2011  . Polycythemia rubra vera ([place]) 09/02/2011    Patient Active Problem List   Diagnosis Date Noted  . Need for prophylactic vaccination and inoculation against influenza 06/17/2016  . Polycythemia rubra vera ([place]) 09/02/2011  . HZ (herpes zoster) 09/02/2011    Past Surgical History:  Procedure Laterality Date  . GAS INSERTION  06/04/2012   Procedure: INSERTION OF GAS;  Surgeon: Hayden Pedro, MD;  Location: [facility];  Service: Ophthalmology;  Laterality: Left;  [id number]  . SCLERAL BUCKLE  06/04/2012   Procedure: SCLERAL BUCKLE;  Surgeon: Hayden Pedro, MD;  Location: [facility];  Service: Ophthalmology;  Laterality: Left;  Headscope laser       No family  history on file.  Social History   Tobacco Use  . Smoking status: Never Smoker  . Smokeless tobacco: Never Used  . Tobacco comment: never used tobacco  Substance Use Topics  . Alcohol use: No    Alcohol/week: 0.0 standard drinks  . Drug use: No    Home Medications Prior to Admission medications   Medication Sig Start Date End Date Taking? Authorizing Provider  acetaminophen (TYLENOL) 325 MG tablet Take 325 mg by mouth every 6 (six) hours as needed for moderate pain.    Yes [provider]  ALPRAZolam [name]) 1 MG tablet Take 0.5 mg by mouth at bedtime.  07/31/18  Yes [provider]  aspirin EC 81 MG tablet Take 81 mg by mouth daily.   Yes [provider]  fish oil-omega-3 fatty acids 1000 MG capsule Take 1 g by mouth daily.    Yes [provider]  lisinopril-hydrochlorothiazide (PRINZIDE,ZESTORETIC) 20-12.5 MG per tablet Take 1 tablet by mouth daily.     Yes [provider]  simvastatin (ZOCOR) 20 MG tablet Take 20 mg by mouth daily at 6 PM.  02/20/19  Yes [provider]  traMADol (ULTRAM) 50 MG tablet Take 50 mg by mouth 2 (two) times daily as needed. 02/27/20  Yes [provider]  vitamin E (VITAMIN E) 1000 UNIT capsule Take 1,000 Units by mouth daily. 11/18/19  Yes [provider]  ondansetron (ZOFRAN ODT) 4 MG disintegrating tablet Take 1 tablet (4 mg total) by mouth every 8 (eight) hours as needed for up to 15 doses for nausea or vomiting. 02/28/20   [name], MD    Allergies    Tramadol  Review of Systems   Review of Systems  Constitutional: Negative for chills and fever.  Eyes: Negative for pain and visual disturbance.  Respiratory: Negative for cough and shortness of breath.   Cardiovascular: Negative for chest pain and palpitations.  Gastrointestinal: Positive for nausea and vomiting. Negative for abdominal pain.  Genitourinary: Negative for dysuria and hematuria.  Musculoskeletal: Negative for  arthralgias and back pain.  Skin: Negative for color change and rash.  Neurological: Negative for syncope and headaches.  Psychiatric/Behavioral: Negative for agitation and confusion.  All other systems reviewed and are negative.   Physical Exam Updated Vital Signs BP (!) 118/51 (BP Location: Left Arm)   Pulse 72   Temp 98 F (36.7 C) (Oral)   Resp 16   Ht 5\' 6"  (1.676 m)   Wt 69.9 kg   SpO2 98%   BMI 24.87 kg/m   Physical Exam Vitals and nursing note reviewed.  Constitutional:      Appearance: He is well-developed.  HENT:     Head: Normocephalic and atraumatic.  Eyes:     Conjunctiva/sclera: Conjunctivae normal.  Cardiovascular:     Rate and Rhythm: Normal rate and regular rhythm.     Pulses: Normal pulses.  Pulmonary:     Effort: Pulmonary effort is normal. No respiratory distress.     Breath sounds: Normal breath sounds.  Abdominal:     Palpations: Abdomen is soft.     Tenderness: There is no abdominal tenderness. There is no guarding or rebound. Negative signs include Murphy's sign, Rovsing's sign and McBurney's sign.  Musculoskeletal:        General: No swelling. Normal range of motion.     Cervical back: Neck supple.  Skin:    General: Skin is warm and dry.  Neurological:     General: No focal deficit present.     Mental Status: He is alert and oriented to person, place, and time.  Psychiatric:        Mood and Affect: Mood normal.        Behavior: Behavior normal.     ED Results / Procedures / Treatments   Labs (all labs ordered are listed, but only abnormal results are displayed) Labs Reviewed  COMPREHENSIVE METABOLIC PANEL - Abnormal; Notable for the following components:      Result Value   Sodium 125 (*)    Chloride 88 (*)    Glucose, Bld 138 (*)    AST 85 (*)    Total Bilirubin 2.1 (*)    All other components within normal limits  CBC - Abnormal; Notable for the following components:   WBC 16.6 (*)    RDW 16.7 (*)    All other components  within normal limits  URINALYSIS, ROUTINE W REFLEX MICROSCOPIC - Abnormal; Notable for the following components:   Hgb urine dipstick SMALL (*)    Ketones, ur 20 (*)    Protein, ur 30 (*)    All other components within normal limits  LIPASE, BLOOD  TROPONIN I (HIGH SENSITIVITY)    EKG EKG Interpretation  Date/Time:  Monday February 28 2020 08:35:12 EDT Ventricular Rate:  78 PR Interval:    QRS Duration: 105 QT Interval:  414 QTC Calculation:  472 R Axis:   80 Text Interpretation: Sinus rhythm RSR' in V1 or V2, right VCD or RVH Baseline artifact, but no evidence of STEMI Confirmed by Octaviano Glow [phone]) on 02/28/2020 8:39:05 AM   Radiology No results found.  Procedures Procedures (including critical care time)  Medications Ordered in ED Medications  sodium chloride 0.9 % bolus 1,000 mL (0 mLs Intravenous Stopped 02/28/20 0913)  ondansetron (ZOFRAN) injection 4 mg (4 mg Intravenous Given 02/28/20 0808)  ketorolac (TORADOL) 15 MG/ML injection 15 mg (15 mg Intravenous Given 02/28/20 0900)    ED Course  I have reviewed the triage vital signs and the nursing notes.  Pertinent labs & imaging results that were available during my care of the patient were reviewed by me and considered in my medical decision making (see chart for details).  79 yo male here with nausea and vomiting since this morning in the setting of initiating new medication yesterday (tramadol, from PCP).  This history is highly consistent with a medication side effect reaction.  No evidence of anaphylaxis or severe allergies from this.  He is fairly well-appearing on exam.  Did have an episode of vomiting after left the room.  Otherwise is benign abdominal exam.  At this time of the lower suspicion for acute intra-abdominal infection including biliary disease, or bowel obstruction.   Will obtain trop, ECG to evaluate for atypical presentation of ACS  Otherwise labs notable for normal hgb and mildly elevated WBC,  consistent with his vomiting.  He has polycythemia, doubtful of crisis at this time.  Also has mild hyponatremia and hypoCl, will give 1L IVF here.  No evidence of AKI or anion gap acidosis.  Lipase wnl  On reassessment patient was tolerating PO, feeling better.  I felt he was stable for discharge.  Clinical Course as of Feb 28 1716  Mon Feb 28, 2020  7035 Now vomiting, given zofran   [MT]  1020 Nausea has improved, he is tolerating PO. Discussed likely diagnosis of medication side effect, advised stopping tramadol, we'll prescribe zofran for him.  I expect him to improve back to baseline by tomorrow.  If he is worsening he will need to return to the ER.   [MT]    Clinical Course User Index [MT] Langston Masker Carola Rhine, MD    Final Clinical Impression(s) / ED Diagnoses Final diagnoses:  Medication side effect    Rx / DC Orders ED Discharge Orders         Ordered    ondansetron (ZOFRAN ODT) 4 MG disintegrating tablet  Every 8 hours PRN     02/28/20 1024           [name], MD 02/28/20 1717

## 2020-02-28 NOTE — Discharge Instructions (Signed)
I suspect your nausea and vomiting may have been a reaction to Tramadol.  Please stop taking this medication.  Call your primary care doctor to arrange for an office follow up in 1-3 days.    You should feel improved and back to normal from a nausea standpoint by tomorrow.  If your symptoms are worsening, and you cannot keep down fluids or eat, please return to the ER.

## 2020-02-28 NOTE — ED Triage Notes (Signed)
Pt reports ongoing back pain which he was prescribed medication from his PCP.  He took two of the pills last night and "I have been throwing up all night."  Pt is alert and oriented at this time.

## 2020-03-06 ENCOUNTER — Telehealth: Payer: Self-pay | Admitting: Hematology & Oncology

## 2020-03-06 NOTE — Telephone Encounter (Signed)
Returned call to patient about rescheduling his 6/15 appt.  Appts moved to next week per his request.

## 2020-03-07 ENCOUNTER — Ambulatory Visit: Payer: Medicare Other | Admitting: Hematology & Oncology

## 2020-03-07 ENCOUNTER — Inpatient Hospital Stay: Payer: Medicare Other

## 2020-03-09 ENCOUNTER — Ambulatory Visit: Payer: Medicare Other | Admitting: Podiatry

## 2020-03-09 ENCOUNTER — Encounter: Payer: Self-pay | Admitting: Podiatry

## 2020-03-09 ENCOUNTER — Other Ambulatory Visit: Payer: Self-pay

## 2020-03-09 DIAGNOSIS — G5792 Unspecified mononeuropathy of left lower limb: Secondary | ICD-10-CM

## 2020-03-10 ENCOUNTER — Telehealth: Payer: Self-pay | Admitting: *Deleted

## 2020-03-10 DIAGNOSIS — G5792 Unspecified mononeuropathy of left lower limb: Secondary | ICD-10-CM

## 2020-03-10 NOTE — Telephone Encounter (Signed)
-----   Message from Rip Harbour, [facility] sent at 03/09/2020  2:14 PM EDT ----- Regarding: Nerve Study Nerve Conduction Study and EMG left - sciatic pain and peroneal nerve pain.

## 2020-03-10 NOTE — Telephone Encounter (Signed)
Prepared required form, demographics to be faxed to [facility] once 03/09/2020 clinicals are received.

## 2020-03-11 NOTE — Progress Notes (Signed)
He presents today for follow-up of neuritis to his left foot.  States is really not doing any better he is starting to have pain up his calf into his thigh and buttocks area as he points to the sciatic notch area of his buttocks.  States that it has gotten to the point where is becoming weak and painful he actually fell on his tailbone and he says that seem to have made it even worse.  He fell back on June 9 of 2021.  Objective: Vital signs are stable he is alert and oriented x3 pain to the foot itself is minimal.  Though with a straight leg raise he does have pain in the buttocks and down the leg.  Assessment: Most likely he has a sciatic impingement or sciatica this resulting in pain to his left foot and leg and perineal pain.  Plan: Discussed etiology pathology conservative versus surgical therapies at this point I think it is feasible to go ahead and do a nerve conduction velocity exam if this comes back negative I think we will have to send him to neurology or neurosurgery for a MRI of his back.  I just do not feel that his pain is actually in his foot I feel that is more of a referred pain.

## 2020-03-14 ENCOUNTER — Telehealth: Payer: Self-pay | Admitting: Sports Medicine

## 2020-03-14 NOTE — Telephone Encounter (Signed)
I spoke with pt and informed of the referral to [facility] [phone], to check status of referral.

## 2020-03-14 NOTE — Telephone Encounter (Signed)
Pt called requesting a call back from Dr. Milinda Pointer. Pt states he was instructed to give the doctor a call in regards to scheduling an appt with the Neurologist.   Please give patient a call.

## 2020-03-14 NOTE — Telephone Encounter (Signed)
Faxed required form, all available clinicals and demographics to [facility].

## 2020-03-15 ENCOUNTER — Telehealth: Payer: Self-pay | Admitting: *Deleted

## 2020-03-15 NOTE — Telephone Encounter (Signed)
Message received from patient stating that he is unable to make appt tomorrow d/t issues with his legs.  Call placed back to patient and patient states that he is having issues with sciatica and would like to reschedule tomorrows appt.  Appts changed to Monday, 03/20/20 at 11:30AM.  Pt appreciative of assistance and has no questions at this time.

## 2020-03-16 ENCOUNTER — Inpatient Hospital Stay: Payer: Medicare Other

## 2020-03-16 ENCOUNTER — Inpatient Hospital Stay: Payer: Medicare Other | Admitting: Family

## 2020-03-20 ENCOUNTER — Telehealth: Payer: Self-pay | Admitting: Family

## 2020-03-20 ENCOUNTER — Encounter: Payer: Self-pay | Admitting: Family

## 2020-03-20 ENCOUNTER — Inpatient Hospital Stay: Payer: Medicare Other

## 2020-03-20 ENCOUNTER — Inpatient Hospital Stay: Payer: Medicare Other | Attending: Family

## 2020-03-20 ENCOUNTER — Inpatient Hospital Stay (HOSPITAL_BASED_OUTPATIENT_CLINIC_OR_DEPARTMENT_OTHER): Payer: Medicare Other | Admitting: Family

## 2020-03-20 ENCOUNTER — Other Ambulatory Visit: Payer: Self-pay

## 2020-03-20 VITALS — BP 127/91 | HR 108 | Temp 98.1°F | Wt 149.0 lb

## 2020-03-20 DIAGNOSIS — R531 Weakness: Secondary | ICD-10-CM | POA: Insufficient documentation

## 2020-03-20 DIAGNOSIS — D751 Secondary polycythemia: Secondary | ICD-10-CM

## 2020-03-20 DIAGNOSIS — D500 Iron deficiency anemia secondary to blood loss (chronic): Secondary | ICD-10-CM

## 2020-03-20 DIAGNOSIS — E538 Deficiency of other specified B group vitamins: Secondary | ICD-10-CM | POA: Diagnosis not present

## 2020-03-20 DIAGNOSIS — R5383 Other fatigue: Secondary | ICD-10-CM | POA: Insufficient documentation

## 2020-03-20 DIAGNOSIS — E611 Iron deficiency: Secondary | ICD-10-CM | POA: Insufficient documentation

## 2020-03-20 DIAGNOSIS — D45 Polycythemia vera: Secondary | ICD-10-CM

## 2020-03-20 LAB — CMP (CANCER CENTER ONLY)
ALT: 19 U/L (ref 0–44)
AST: 16 U/L (ref 15–41)
Albumin: 4.1 g/dL (ref 3.5–5.0)
Alkaline Phosphatase: 58 U/L (ref 38–126)
Anion gap: 9 (ref 5–15)
BUN: 11 mg/dL (ref 8–23)
CO2: 27 mmol/L (ref 22–32)
Calcium: 10.1 mg/dL (ref 8.9–10.3)
Chloride: 94 mmol/L — ABNORMAL LOW (ref 98–111)
Creatinine: 0.78 mg/dL (ref 0.61–1.24)
GFR, Est AFR Am: >60 mL/min (ref >60)
GFR, Estimated: >60 mL/min (ref >60)
Glucose, Bld: 149 mg/dL — ABNORMAL HIGH (ref 70–99)
Potassium: 3.4 mmol/L — ABNORMAL LOW (ref 3.5–5.1)
Sodium: 130 mmol/L — ABNORMAL LOW (ref 135–145)
Total Bilirubin: 1.4 mg/dL — ABNORMAL HIGH (ref 0.3–1.2)
Total Protein: 6.8 g/dL (ref 6.5–8.1)

## 2020-03-20 LAB — CBC WITH DIFFERENTIAL (CANCER CENTER ONLY)
Abs Immature Granulocytes: 0.03 10*3/uL (ref 0.00–0.07)
Basophils Absolute: 0 10*3/uL (ref 0.0–0.1)
Basophils Relative: 0 %
Eosinophils Absolute: 0 10*3/uL (ref 0.0–0.5)
Eosinophils Relative: 0 %
HCT: 47.4 % (ref 39.0–52.0)
Hemoglobin: 15.2 g/dL (ref 13.0–17.0)
Immature Granulocytes: 0 %
Lymphocytes Relative: 13 %
Lymphs Abs: 1.5 10*3/uL (ref 0.7–4.0)
MCH: 27.9 pg (ref 26.0–34.0)
MCHC: 32.1 g/dL (ref 30.0–36.0)
MCV: 87 fL (ref 80.0–100.0)
Monocytes Absolute: 0.9 10*3/uL (ref 0.1–1.0)
Monocytes Relative: 8 %
Neutro Abs: 8.9 10*3/uL — ABNORMAL HIGH (ref 1.7–7.7)
Neutrophils Relative %: 79 %
Platelet Count: 331 10*3/uL (ref 150–400)
RBC: 5.45 MIL/uL (ref 4.22–5.81)
RDW: 17.8 % — ABNORMAL HIGH (ref 11.5–15.5)
WBC Count: 11.3 10*3/uL — ABNORMAL HIGH (ref 4.0–10.5)
nRBC: 0 % (ref 0.0–0.2)

## 2020-03-20 NOTE — Progress Notes (Signed)
Hematology and Oncology Follow Up Visit  Jesse [name] [id number] 17-Jun-1941 79 y.o. 03/20/2020   Principle Diagnosis:  Polycythemia vera - JAK2 negative  Current Therapy:        Phlebotomy to maintain hematocrit below 45% Aspirin 81 mg by mouth daily   Interim History:  Jesse [name] is here today for follow-up. He is not feeling well today. He fell earlier this month and is still sore in his tailbone. He states that the flal was due to weakness not syncope.  He still feels fatigued and weak. He states that his PCP found that he is B12 deficient and will start injections this week.  He is chronically iron deficient with phlebotomies. Hct is 47.4%.  He has not had any episodes of bleeding but does bruise easily.  No fever, chills, n/v, cough, rash, dizziness, SOB, chest pain, palpitations, abdominal pain or changes in bowel or bladder habits.  No swelling, numbness or tingling in his extremities at this time. He sometimes has tingling in the hands and feet due to arthritis.  He has tenderness in the left leg and has recently started Neurontin for the pain.  He states that his appetite is a little better and that he is drinking "lots of water every day".  ECOG Performance Status: 1 - Symptomatic but completely ambulatory  Medications:  Allergies as of 03/20/2020      Reactions   Tramadol Nausea And Vomiting      Medication List       Accurate as of March 20, 2020 12:08 PM. If you have any questions, ask your nurse or doctor.        acetaminophen 325 MG tablet Commonly known as: TYLENOL Take 325 mg by mouth every 6 (six) hours as needed for moderate pain.   ALPRAZolam 1 MG tablet Commonly known as: XANAX Take 0.5 mg by mouth at bedtime.   aspirin EC 81 MG tablet Take 81 mg by mouth daily.   fish oil-omega-3 fatty acids 1000 MG capsule Take 1 g by mouth daily.   gabapentin 100 MG capsule Commonly known as: NEURONTIN Take 100 mg by mouth every morning.     lisinopril-hydrochlorothiazide 20-12.5 MG tablet Commonly known as: ZESTORETIC Take 1 tablet by mouth daily.   ondansetron 4 MG disintegrating tablet Commonly known as: Zofran ODT Take 1 tablet (4 mg total) by mouth every 8 (eight) hours as needed for up to 15 doses for nausea or vomiting.   simvastatin 20 MG tablet Commonly known as: ZOCOR Take 20 mg by mouth daily at 6 PM.   traMADol 50 MG tablet Commonly known as: ULTRAM Take 50 mg by mouth 2 (two) times daily as needed.   vitamin E 1000 UNIT capsule Generic drug: vitamin E Take 1,000 Units by mouth daily.       Allergies:  Allergies  Allergen Reactions  . Tramadol Nausea And Vomiting    Past Medical History, Surgical history, Social history, and Family History were reviewed and updated.  Review of Systems: All other 10 point review of systems is negative.   Physical Exam:  weight is 149 lb (67.6 kg). His oral temperature is 98.1 F (36.7 C). His blood pressure is 127/91 (abnormal) and his pulse is 108 (abnormal). His oxygen saturation is 99%.   Wt Readings from Last 3 Encounters:  03/20/20 149 lb (67.6 kg)  02/28/20 154 lb 1.6 oz (69.9 kg)  01/11/20 154 lb (69.9 kg)    Ocular: Sclerae unicteric, pupils equal, round and  reactive to light Ear-nose-throat: Oropharynx clear, dentition fair Lymphatic: No cervical or supraclavicular adenopathy Lungs no rales or rhonchi, good excursion bilaterally Heart regular rate and rhythm, no murmur appreciated Abd soft, nontender, positive bowel sounds, no liver or spleen tip palpated on exam, no fluid wave  MSK no focal spinal tenderness, no joint edema Neuro: non-focal, well-oriented, appropriate affect Breasts: Deferred   Lab Results  Component Value Date   WBC 11.3 (H) 03/20/2020   HGB 15.2 03/20/2020   HCT 47.4 03/20/2020   MCV 87.0 03/20/2020   PLT 331 03/20/2020   Lab Results  Component Value Date   FERRITIN 15 (L) 01/11/2020   IRON 113 01/11/2020   TIBC  418 (H) 01/11/2020   UIBC 305 01/11/2020   IRONPCTSAT 27 01/11/2020   Lab Results  Component Value Date   RETICCTPCT 1.5 01/11/2020   RBC 5.45 03/20/2020   RETICCTABS 87.5 01/02/2011   No results found for: KPAFRELGTCHN, LAMBDASER, KAPLAMBRATIO No results found for: Kandis Cocking, IGMSERUM No results found for: Kathrynn Ducking, MSPIKE, SPEI   Chemistry      Component Value Date/Time   NA 130 (L) 03/20/2020 1128   NA 136 08/19/2017 1149   NA 135 (L) 10/21/2016 1151   K 3.4 (L) 03/20/2020 1128   K 3.8 08/19/2017 1149   K 3.9 10/21/2016 1151   CL 94 (L) 03/20/2020 1128   CL 95 (L) 08/19/2017 1149   CO2 27 03/20/2020 1128   CO2 28 08/19/2017 1149   CO2 25 10/21/2016 1151   BUN 11 03/20/2020 1128   BUN 14 08/19/2017 1149   BUN 13.6 10/21/2016 1151   CREATININE 0.78 03/20/2020 1128   CREATININE 1.0 08/19/2017 1149   CREATININE 0.8 10/21/2016 1151      Component Value Date/Time   CALCIUM 10.1 03/20/2020 1128   CALCIUM 10.1 08/19/2017 1149   CALCIUM 10.3 10/21/2016 1151   ALKPHOS 58 03/20/2020 1128   ALKPHOS 68 08/19/2017 1149   ALKPHOS 68 10/21/2016 1151   AST 16 03/20/2020 1128   AST 25 10/21/2016 1151   ALT 19 03/20/2020 1128   ALT 32 08/19/2017 1149   ALT 38 10/21/2016 1151   BILITOT 1.4 (H) 03/20/2020 1128   BILITOT 1.22 (H) 10/21/2016 1151       Impression and Plan: Jesse [name] is a very pleasant 79 yo caucasian gentleman with polycythemia, JAK-2 negative.  No phlebotomy today due to fall and fatigue/weaknes. We will give him time to recuperate and start B 12 injections.  We will see him again in another month.  He will contact our office with any questions or concerns. We can certainly see him sooner if needed.   Laverna Peace, NP 6/28/202112:08 PM

## 2020-03-20 NOTE — Telephone Encounter (Signed)
Appointments scheduled calendar printed per 6/28 los

## 2020-03-21 LAB — IRON AND TIBC
Iron: 66 ug/dL (ref 42–163)
Saturation Ratios: 17 % — ABNORMAL LOW (ref 20–55)
TIBC: 378 ug/dL (ref 202–409)
UIBC: 312 ug/dL (ref 117–376)

## 2020-03-21 LAB — FERRITIN: Ferritin: 25 ng/mL (ref 24–336)

## 2020-04-18 ENCOUNTER — Other Ambulatory Visit: Payer: Self-pay

## 2020-04-18 ENCOUNTER — Inpatient Hospital Stay: Payer: Medicare Other

## 2020-04-18 ENCOUNTER — Inpatient Hospital Stay (HOSPITAL_BASED_OUTPATIENT_CLINIC_OR_DEPARTMENT_OTHER): Payer: Medicare Other | Admitting: Family

## 2020-04-18 ENCOUNTER — Encounter: Payer: Self-pay | Admitting: Family

## 2020-04-18 ENCOUNTER — Inpatient Hospital Stay: Payer: Medicare Other | Attending: Family

## 2020-04-18 VITALS — BP 133/76 | HR 84 | Temp 98.2°F | Resp 18 | Wt 151.0 lb

## 2020-04-18 DIAGNOSIS — D45 Polycythemia vera: Secondary | ICD-10-CM | POA: Diagnosis not present

## 2020-04-18 DIAGNOSIS — D500 Iron deficiency anemia secondary to blood loss (chronic): Secondary | ICD-10-CM

## 2020-04-18 DIAGNOSIS — D751 Secondary polycythemia: Secondary | ICD-10-CM | POA: Diagnosis not present

## 2020-04-18 LAB — CMP (CANCER CENTER ONLY)
ALT: 16 U/L (ref 0–44)
AST: 13 U/L — ABNORMAL LOW (ref 15–41)
Albumin: 4.3 g/dL (ref 3.5–5.0)
Alkaline Phosphatase: 56 U/L (ref 38–126)
Anion gap: 6 (ref 5–15)
BUN: 15 mg/dL (ref 8–23)
CO2: 31 mmol/L (ref 22–32)
Calcium: 10.6 mg/dL — ABNORMAL HIGH (ref 8.9–10.3)
Chloride: 99 mmol/L (ref 98–111)
Creatinine: 1.01 mg/dL (ref 0.61–1.24)
GFR, Est AFR Am: >60 mL/min (ref >60)
GFR, Estimated: >60 mL/min (ref >60)
Glucose, Bld: 111 mg/dL — ABNORMAL HIGH (ref 70–99)
Potassium: 4.3 mmol/L (ref 3.5–5.1)
Sodium: 136 mmol/L (ref 135–145)
Total Bilirubin: 1.3 mg/dL — ABNORMAL HIGH (ref 0.3–1.2)
Total Protein: 6.7 g/dL (ref 6.5–8.1)

## 2020-04-18 LAB — CBC WITH DIFFERENTIAL (CANCER CENTER ONLY)
Abs Immature Granulocytes: 0.03 10*3/uL (ref 0.00–0.07)
Basophils Absolute: 0.1 10*3/uL (ref 0.0–0.1)
Basophils Relative: 1 %
Eosinophils Absolute: 0 10*3/uL (ref 0.0–0.5)
Eosinophils Relative: 1 %
HCT: 47.6 % (ref 39.0–52.0)
Hemoglobin: 15.2 g/dL (ref 13.0–17.0)
Immature Granulocytes: 0 %
Lymphocytes Relative: 22 %
Lymphs Abs: 1.8 10*3/uL (ref 0.7–4.0)
MCH: 28.3 pg (ref 26.0–34.0)
MCHC: 31.9 g/dL (ref 30.0–36.0)
MCV: 88.5 fL (ref 80.0–100.0)
Monocytes Absolute: 1 10*3/uL (ref 0.1–1.0)
Monocytes Relative: 12 %
Neutro Abs: 5.1 10*3/uL (ref 1.7–7.7)
Neutrophils Relative %: 64 %
Platelet Count: 282 10*3/uL (ref 150–400)
RBC: 5.38 MIL/uL (ref 4.22–5.81)
RDW: 17.2 % — ABNORMAL HIGH (ref 11.5–15.5)
WBC Count: 7.9 10*3/uL (ref 4.0–10.5)
nRBC: 0 % (ref 0.0–0.2)

## 2020-04-18 NOTE — Patient Instructions (Signed)
Therapeutic Phlebotomy Therapeutic phlebotomy is the planned removal of blood from a person's body for the purpose of treating a medical condition. The procedure is similar to donating blood. Usually, about a pint (470 mL, or 0.47 L) of blood is removed. The average adult has 9-12 pints (4.3-5.7 L) of blood in the body. Therapeutic phlebotomy may be used to treat the following medical conditions:  Hemochromatosis. This is a condition in which the blood contains too much iron.  Polycythemia vera. This is a condition in which the blood contains too many red blood cells.  Porphyria cutanea tarda. This is a disease in which an important part of hemoglobin is not made properly. It results in the buildup of abnormal amounts of porphyrins in the body.  Sickle cell disease. This is a condition in which the red blood cells form an abnormal crescent shape rather than a round shape. Tell a health care provider about:  Any allergies you have.  All medicines you are taking, including vitamins, herbs, eye drops, creams, and over-the-counter medicines.  Any problems you or family members have had with anesthetic medicines.  Any blood disorders you have.  Any surgeries you have had.  Any medical conditions you have.  Whether you are pregnant or may be pregnant. What are the risks? Generally, this is a safe procedure. However, problems may occur, including:  Nausea or light-headedness.  Low blood pressure (hypotension).  Soreness, bleeding, swelling, or bruising at the needle insertion site.  Infection. What happens before the procedure?  Follow instructions from your health care provider about eating or drinking restrictions.  Ask your health care provider about: ? Changing or stopping your regular medicines. This is especially important if you are taking diabetes medicines or blood thinners (anticoagulants). ? Taking medicines such as aspirin and ibuprofen. These medicines can thin your  blood. Do not take these medicines unless your health care provider tells you to take them. ? Taking over-the-counter medicines, vitamins, herbs, and supplements.  Wear clothing with sleeves that can be raised above the elbow.  Plan to have someone take you home from the hospital or clinic.  You may have a blood sample taken.  Your blood pressure, pulse rate, and breathing rate will be measured. What happens during the procedure?   To lower your risk of infection: ? Your health care team will wash or sanitize their hands. ? Your skin will be cleaned with an antiseptic.  You may be given a medicine to numb the area (local anesthetic).  A tourniquet will be placed on your arm.  A needle will be inserted into one of your veins.  Tubing and a collection bag will be attached to that needle.  Blood will flow through the needle and tubing into the collection bag.  The collection bag will be placed lower than your arm to allow gravity to help the flow of blood into the bag.  You may be asked to open and close your hand slowly and continually during the entire collection.  After the specified amount of blood has been removed from your body, the collection bag and tubing will be clamped.  The needle will be removed from your vein.  Pressure will be held on the site of the needle insertion to stop the bleeding.  A bandage (dressing) will be placed over the needle insertion site. The procedure may vary among health care providers and hospitals. What happens after the procedure?  Your blood pressure, pulse rate, and breathing rate will be   measured after the procedure.  You will be encouraged to drink fluids.  Your recovery will be assessed and monitored.  You can return to your normal activities as told by your health care provider. Summary  Therapeutic phlebotomy is the planned removal of blood from a person's body for the purpose of treating a medical condition.  Therapeutic  phlebotomy may be used to treat hemochromatosis, polycythemia vera, porphyria cutanea tarda, or sickle cell disease.  In the procedure, a needle is inserted and about a pint (470 mL, or 0.47 L) of blood is removed. The average adult has 9-12 pints (4.3-5.7 L) of blood in the body.  This is generally a safe procedure, but it can sometimes cause problems such as nausea, light-headedness, or low blood pressure (hypotension). This information is not intended to replace advice given to you by your health care provider. Make sure you discuss any questions you have with your health care provider. Document Revised: 09/25/2017 Document Reviewed: 09/25/2017 Elsevier Patient Education  2020 [employer]  

## 2020-04-18 NOTE — Progress Notes (Signed)
Angie Fava presents today for phlebotomy per MD orders. Phlebotomy procedure started at 1200 with 16 g catheter by Armanda Magic RN and ended at 1215.  grams removed. Refreshments given Patient observed for 30 minutes after procedure without any incident. Patient tolerated procedure well. IV needle removed intact.

## 2020-04-18 NOTE — Progress Notes (Signed)
Hematology and Oncology Follow Up Visit  Jesse [name] [id number] [birth year] 79 y.o. 04/18/2020   Principle Diagnosis:  Polycythemia vera - JAK2 negative  Current Therapy: Phlebotomy to maintain hematocrit below 45% Aspirin 81 mg by mouth daily   Interim History:  Jesse [name] is here today for follow-up. He is feeling much better since starting the B 12 injections. His energy has improved.  No fever, chills, n/v, cough, rash, dizziness, SOB, chest pain, palpitations, abdominal pain or changes in bowel or bladder habits.  No episodes of bleeding. No bruising or petechiae.  The neuropathy in his lower extremities is unchanged. He states that his PCP has referred him to neurology. No swelling in his extremities.  No falls or syncopal episodes to report.  He is eating well and staying hydrated. His weight is stable at 151 lbs.   ECOG Performance Status: 1 - Symptomatic but completely ambulatory  Medications:  Allergies as of 04/18/2020      Reactions   Tramadol Nausea And Vomiting      Medication List       Accurate as of April 18, 2020 11:42 AM. If you have any questions, ask your nurse or doctor.        acetaminophen 325 MG tablet Commonly known as: TYLENOL Take 325 mg by mouth every 6 (six) hours as needed for moderate pain.   ALPRAZolam 1 MG tablet Commonly known as: XANAX Take 0.5 mg by mouth at bedtime.   aspirin EC 81 MG tablet Take 81 mg by mouth daily.   fish oil-omega-3 fatty acids 1000 MG capsule Take 1 g by mouth daily.   gabapentin 100 MG capsule Commonly known as: NEURONTIN Take 100 mg by mouth every morning.   lisinopril-hydrochlorothiazide 20-12.5 MG tablet Commonly known as: ZESTORETIC Take 1 tablet by mouth daily.   ondansetron 4 MG disintegrating tablet Commonly known as: Zofran ODT Take 1 tablet (4 mg total) by mouth every 8 (eight) hours as needed for up to 15 doses for nausea or vomiting.   simvastatin 20 MG tablet Commonly known  as: ZOCOR Take 20 mg by mouth daily at 6 PM.   traMADol 50 MG tablet Commonly known as: ULTRAM Take 50 mg by mouth 2 (two) times daily as needed.   vitamin E 1000 UNIT capsule Generic drug: vitamin E Take 1,000 Units by mouth daily.       Allergies:  Allergies  Allergen Reactions  . Tramadol Nausea And Vomiting    Past Medical History, Surgical history, Social history, and Family History were reviewed and updated.  Review of Systems: All other 10 point review of systems is negative.   Physical Exam:  vitals were not taken for this visit.   Wt Readings from Last 3 Encounters:  03/20/20 149 lb (67.6 kg)  02/28/20 154 lb 1.6 oz (69.9 kg)  01/11/20 154 lb (69.9 kg)    Ocular: Sclerae unicteric, pupils equal, round and reactive to light Ear-nose-throat: Oropharynx clear, dentition fair Lymphatic: No cervical or supraclavicular adenopathy Lungs no rales or rhonchi, good excursion bilaterally Heart regular rate and rhythm, no murmur appreciated Abd soft, nontender, positive bowel sounds, no liver or spleen tip palpated on exam, no fluid wave  MSK no focal spinal tenderness, no joint edema Neuro: non-focal, well-oriented, appropriate affect Breasts: Deferred   Lab Results  Component Value Date   WBC 7.9 04/18/2020   HGB 15.2 04/18/2020   HCT 47.6 04/18/2020   MCV 88.5 04/18/2020   PLT 282 04/18/2020  Lab Results  Component Value Date   FERRITIN 25 03/20/2020   IRON 66 03/20/2020   TIBC 378 03/20/2020   UIBC 312 03/20/2020   IRONPCTSAT 17 (L) 03/20/2020   Lab Results  Component Value Date   RETICCTPCT 1.5 01/11/2020   RBC 5.38 04/18/2020   RETICCTABS 87.5 01/02/2011   No results found for: KPAFRELGTCHN, LAMBDASER, KAPLAMBRATIO No results found for: Kandis Cocking, IGMSERUM No results found for: Kathrynn Ducking, MSPIKE, SPEI   Chemistry      Component Value Date/Time   NA 130 (L) 03/20/2020 1128   NA 136  08/19/2017 1149   NA 135 (L) 10/21/2016 1151   K 3.4 (L) 03/20/2020 1128   K 3.8 08/19/2017 1149   K 3.9 10/21/2016 1151   CL 94 (L) 03/20/2020 1128   CL 95 (L) 08/19/2017 1149   CO2 27 03/20/2020 1128   CO2 28 08/19/2017 1149   CO2 25 10/21/2016 1151   BUN 11 03/20/2020 1128   BUN 14 08/19/2017 1149   BUN 13.6 10/21/2016 1151   CREATININE 0.78 03/20/2020 1128   CREATININE 1.0 08/19/2017 1149   CREATININE 0.8 10/21/2016 1151      Component Value Date/Time   CALCIUM 10.1 03/20/2020 1128   CALCIUM 10.1 08/19/2017 1149   CALCIUM 10.3 10/21/2016 1151   ALKPHOS 58 03/20/2020 1128   ALKPHOS 68 08/19/2017 1149   ALKPHOS 68 10/21/2016 1151   AST 16 03/20/2020 1128   AST 25 10/21/2016 1151   ALT 19 03/20/2020 1128   ALT 32 08/19/2017 1149   ALT 38 10/21/2016 1151   BILITOT 1.4 (H) 03/20/2020 1128   BILITOT 1.22 (H) 10/21/2016 1151       Impression and Plan: Jesse [name] is a very pleasant 79yo caucasian gentleman with polycythemia, JAK-2 negative.  We will do a phlebotomy today for Hct 47.6% We will plan to see him again in another month.  He can contact our office with any questions or concerns.   Laverna Peace, NP 7/27/202111:42 AM

## 2020-04-19 LAB — IRON AND TIBC
Iron: 69 ug/dL (ref 42–163)
Saturation Ratios: 18 % — ABNORMAL LOW (ref 20–55)
TIBC: 381 ug/dL (ref 202–409)
UIBC: 312 ug/dL (ref 117–376)

## 2020-04-19 LAB — FERRITIN: Ferritin: 18 ng/mL — ABNORMAL LOW (ref 24–336)

## 2020-05-19 ENCOUNTER — Inpatient Hospital Stay: Payer: Medicare Other

## 2020-05-19 ENCOUNTER — Inpatient Hospital Stay: Payer: Medicare Other | Attending: Family

## 2020-05-19 ENCOUNTER — Other Ambulatory Visit: Payer: Self-pay

## 2020-05-19 ENCOUNTER — Encounter: Payer: Self-pay | Admitting: Family

## 2020-05-19 ENCOUNTER — Inpatient Hospital Stay (HOSPITAL_BASED_OUTPATIENT_CLINIC_OR_DEPARTMENT_OTHER): Payer: Medicare Other | Admitting: Family

## 2020-05-19 VITALS — BP 125/72 | HR 89 | Temp 98.3°F | Resp 18 | Ht 66.0 in | Wt 150.1 lb

## 2020-05-19 DIAGNOSIS — D45 Polycythemia vera: Secondary | ICD-10-CM

## 2020-05-19 DIAGNOSIS — Z7982 Long term (current) use of aspirin: Secondary | ICD-10-CM | POA: Diagnosis not present

## 2020-05-19 DIAGNOSIS — D751 Secondary polycythemia: Secondary | ICD-10-CM

## 2020-05-19 DIAGNOSIS — D500 Iron deficiency anemia secondary to blood loss (chronic): Secondary | ICD-10-CM

## 2020-05-19 LAB — CMP (CANCER CENTER ONLY)
ALT: 14 U/L (ref 0–44)
AST: 13 U/L — ABNORMAL LOW (ref 15–41)
Albumin: 4.2 g/dL (ref 3.5–5.0)
Alkaline Phosphatase: 51 U/L (ref 38–126)
Anion gap: 6 (ref 5–15)
BUN: 14 mg/dL (ref 8–23)
CO2: 30 mmol/L (ref 22–32)
Calcium: 10.3 mg/dL (ref 8.9–10.3)
Chloride: 97 mmol/L — ABNORMAL LOW (ref 98–111)
Creatinine: 0.88 mg/dL (ref 0.61–1.24)
GFR, Est AFR Am: >60 mL/min (ref >60)
GFR, Estimated: >60 mL/min (ref >60)
Glucose, Bld: 100 mg/dL — ABNORMAL HIGH (ref 70–99)
Potassium: 4 mmol/L (ref 3.5–5.1)
Sodium: 133 mmol/L — ABNORMAL LOW (ref 135–145)
Total Bilirubin: 1.4 mg/dL — ABNORMAL HIGH (ref 0.3–1.2)
Total Protein: 6.8 g/dL (ref 6.5–8.1)

## 2020-05-19 LAB — CBC WITH DIFFERENTIAL (CANCER CENTER ONLY)
Abs Immature Granulocytes: 0.03 10*3/uL (ref 0.00–0.07)
Basophils Absolute: 0 10*3/uL (ref 0.0–0.1)
Basophils Relative: 1 %
Eosinophils Absolute: 0.1 10*3/uL (ref 0.0–0.5)
Eosinophils Relative: 1 %
HCT: 45.6 % (ref 39.0–52.0)
Hemoglobin: 14.6 g/dL (ref 13.0–17.0)
Immature Granulocytes: 0 %
Lymphocytes Relative: 25 %
Lymphs Abs: 1.8 10*3/uL (ref 0.7–4.0)
MCH: 28.1 pg (ref 26.0–34.0)
MCHC: 32 g/dL (ref 30.0–36.0)
MCV: 87.9 fL (ref 80.0–100.0)
Monocytes Absolute: 0.9 10*3/uL (ref 0.1–1.0)
Monocytes Relative: 13 %
Neutro Abs: 4.3 10*3/uL (ref 1.7–7.7)
Neutrophils Relative %: 60 %
Platelet Count: 303 10*3/uL (ref 150–400)
RBC: 5.19 MIL/uL (ref 4.22–5.81)
RDW: 15.4 % (ref 11.5–15.5)
WBC Count: 7.1 10*3/uL (ref 4.0–10.5)
nRBC: 0 % (ref 0.0–0.2)

## 2020-05-19 NOTE — Progress Notes (Signed)
Jesse [name] presents today for phlebotomy per MD orders. Phlebotomy procedure started at 0941 and ended at 0948 267 grams removed. Patient observed for 30 minutes after procedure without any incident. Patient tolerated procedure well. IV needle removed intact.

## 2020-05-19 NOTE — Patient Instructions (Signed)
Therapeutic Phlebotomy Therapeutic phlebotomy is the planned removal of blood from a person's body for the purpose of treating a medical condition. The procedure is similar to donating blood. Usually, about a pint (470 mL, or 0.47 L) of blood is removed. The average adult has 9-12 pints (4.3-5.7 L) of blood in the body. Therapeutic phlebotomy may be used to treat the following medical conditions:  Hemochromatosis. This is a condition in which the blood contains too much iron.  Polycythemia vera. This is a condition in which the blood contains too many red blood cells.  Porphyria cutanea tarda. This is a disease in which an important part of hemoglobin is not made properly. It results in the buildup of abnormal amounts of porphyrins in the body.  Sickle cell disease. This is a condition in which the red blood cells form an abnormal crescent shape rather than a round shape. Tell a health care provider about:  Any allergies you have.  All medicines you are taking, including vitamins, herbs, eye drops, creams, and over-the-counter medicines.  Any problems you or family members have had with anesthetic medicines.  Any blood disorders you have.  Any surgeries you have had.  Any medical conditions you have.  Whether you are pregnant or may be pregnant. What are the risks? Generally, this is a safe procedure. However, problems may occur, including:  Nausea or light-headedness.  Low blood pressure (hypotension).  Soreness, bleeding, swelling, or bruising at the needle insertion site.  Infection. What happens before the procedure?  Follow instructions from your health care provider about eating or drinking restrictions.  Ask your health care provider about: ? Changing or stopping your regular medicines. This is especially important if you are taking diabetes medicines or blood thinners (anticoagulants). ? Taking medicines such as aspirin and ibuprofen. These medicines can thin your  blood. Do not take these medicines unless your health care provider tells you to take them. ? Taking over-the-counter medicines, vitamins, herbs, and supplements.  Wear clothing with sleeves that can be raised above the elbow.  Plan to have someone take you home from the hospital or clinic.  You may have a blood sample taken.  Your blood pressure, pulse rate, and breathing rate will be measured. What happens during the procedure?   To lower your risk of infection: ? Your health care team will wash or sanitize their hands. ? Your skin will be cleaned with an antiseptic.  You may be given a medicine to numb the area (local anesthetic).  A tourniquet will be placed on your arm.  A needle will be inserted into one of your veins.  Tubing and a collection bag will be attached to that needle.  Blood will flow through the needle and tubing into the collection bag.  The collection bag will be placed lower than your arm to allow gravity to help the flow of blood into the bag.  You may be asked to open and close your hand slowly and continually during the entire collection.  After the specified amount of blood has been removed from your body, the collection bag and tubing will be clamped.  The needle will be removed from your vein.  Pressure will be held on the site of the needle insertion to stop the bleeding.  A bandage (dressing) will be placed over the needle insertion site. The procedure may vary among health care providers and hospitals. What happens after the procedure?  Your blood pressure, pulse rate, and breathing rate will be   measured after the procedure.  You will be encouraged to drink fluids.  Your recovery will be assessed and monitored.  You can return to your normal activities as told by your health care provider. Summary  Therapeutic phlebotomy is the planned removal of blood from a person's body for the purpose of treating a medical condition.  Therapeutic  phlebotomy may be used to treat hemochromatosis, polycythemia vera, porphyria cutanea tarda, or sickle cell disease.  In the procedure, a needle is inserted and about a pint (470 mL, or 0.47 L) of blood is removed. The average adult has 9-12 pints (4.3-5.7 L) of blood in the body.  This is generally a safe procedure, but it can sometimes cause problems such as nausea, light-headedness, or low blood pressure (hypotension). This information is not intended to replace advice given to you by your health care provider. Make sure you discuss any questions you have with your health care provider. Document Revised: 09/25/2017 Document Reviewed: 09/25/2017 Elsevier Patient Education  2020 [employer]  

## 2020-05-19 NOTE — Progress Notes (Signed)
Hematology and Oncology Follow Up Visit  Jesse [name] [id number] August 23, 1941 79 y.o. 05/19/2020   Principle Diagnosis:  Polycythemia vera - JAK2 negative  Current Therapy: Phlebotomy to maintain hematocrit below 45% Aspirin 81 mg by mouth daily   Interim History:  Jesse [name] is here today for follow-up. He is doing well but has some mild fatigue at times.  He had a phlebotomy last month and Hct is down to 45.6% from 47.6%.  No episodes of bleeding, bruising or petechiae.  No fever, chills, n/v, cough, rash, dizziness, SOB, chest pain, palpitations, abdominal pain or changes in bowel or bladder habits.  The neuropathy in his left leg is stable and he feels that the Gabapentin has helped.  No swelling in his extremities at this time. Pedal pulses are 2+.  No falls or syncope.  He has maintained a good appetite and is staying well hydrated. His weight is stable.   ECOG Performance Status: 1 - Symptomatic but completely ambulatory  Medications:  Allergies as of 05/19/2020      Reactions   Tramadol Nausea And Vomiting      Medication List       Accurate as of May 19, 2020 11:30 AM. If you have any questions, ask your nurse or doctor.        acetaminophen 325 MG tablet Commonly known as: TYLENOL Take 325 mg by mouth every 6 (six) hours as needed for moderate pain.   ALPRAZolam 1 MG tablet Commonly known as: XANAX Take 0.5 mg by mouth at bedtime.   aspirin EC 81 MG tablet Take 81 mg by mouth daily.   fish oil-omega-3 fatty acids 1000 MG capsule Take 1 g by mouth daily.   gabapentin 100 MG capsule Commonly known as: NEURONTIN Take 100 mg by mouth every morning.   lisinopril-hydrochlorothiazide 20-12.5 MG tablet Commonly known as: ZESTORETIC Take 1 tablet by mouth daily.   ondansetron 4 MG disintegrating tablet Commonly known as: Zofran ODT Take 1 tablet (4 mg total) by mouth every 8 (eight) hours as needed for up to 15 doses for nausea or vomiting.     simvastatin 20 MG tablet Commonly known as: ZOCOR Take 20 mg by mouth daily at 6 PM.   traMADol 50 MG tablet Commonly known as: ULTRAM Take 50 mg by mouth 2 (two) times daily as needed.   vitamin E 1000 UNIT capsule Generic drug: vitamin E Take 1,000 Units by mouth daily.       Allergies:  Allergies  Allergen Reactions   Tramadol Nausea And Vomiting    Past Medical History, Surgical history, Social history, and Family History were reviewed and updated.  Review of Systems: All other 10 point review of systems is negative.   Physical Exam:  vitals were not taken for this visit.   Wt Readings from Last 3 Encounters:  04/18/20 151 lb 0.6 oz (68.5 kg)  03/20/20 149 lb (67.6 kg)  02/28/20 154 lb 1.6 oz (69.9 kg)    Ocular: Sclerae unicteric, pupils equal, round and reactive to light Ear-nose-throat: Oropharynx clear, dentition fair Lymphatic: No cervical or supraclavicular adenopathy Lungs no rales or rhonchi, good excursion bilaterally Heart regular rate and rhythm, no murmur appreciated Abd soft, nontender, positive bowel sounds, no liver or spleen tip palpated on exam, no fluid wave  MSK no focal spinal tenderness, no joint edema Neuro: non-focal, well-oriented, appropriate affect Breasts: Deferred   Lab Results  Component Value Date   WBC 7.1 05/19/2020   HGB 14.6  05/19/2020   HCT 45.6 05/19/2020   MCV 87.9 05/19/2020   PLT 303 05/19/2020   Lab Results  Component Value Date   FERRITIN 18 (L) 04/18/2020   IRON 69 04/18/2020   TIBC 381 04/18/2020   UIBC 312 04/18/2020   IRONPCTSAT 18 (L) 04/18/2020   Lab Results  Component Value Date   RETICCTPCT 1.5 01/11/2020   RBC 5.19 05/19/2020   RETICCTABS 87.5 01/02/2011   No results found for: KPAFRELGTCHN, LAMBDASER, KAPLAMBRATIO No results found for: Kandis Cocking, IGMSERUM No results found for: Kathrynn Ducking, MSPIKE, SPEI   Chemistry      Component Value  Date/Time   NA 133 (L) 05/19/2020 1054   NA 136 08/19/2017 1149   NA 135 (L) 10/21/2016 1151   K 4.0 05/19/2020 1054   K 3.8 08/19/2017 1149   K 3.9 10/21/2016 1151   CL 97 (L) 05/19/2020 1054   CL 95 (L) 08/19/2017 1149   CO2 30 05/19/2020 1054   CO2 28 08/19/2017 1149   CO2 25 10/21/2016 1151   BUN 14 05/19/2020 1054   BUN 14 08/19/2017 1149   BUN 13.6 10/21/2016 1151   CREATININE 0.88 05/19/2020 1054   CREATININE 1.0 08/19/2017 1149   CREATININE 0.8 10/21/2016 1151      Component Value Date/Time   CALCIUM 10.3 05/19/2020 1054   CALCIUM 10.1 08/19/2017 1149   CALCIUM 10.3 10/21/2016 1151   ALKPHOS 51 05/19/2020 1054   ALKPHOS 68 08/19/2017 1149   ALKPHOS 68 10/21/2016 1151   AST 13 (L) 05/19/2020 1054   AST 25 10/21/2016 1151   ALT 14 05/19/2020 1054   ALT 32 08/19/2017 1149   ALT 38 10/21/2016 1151   BILITOT 1.4 (H) 05/19/2020 1054   BILITOT 1.22 (H) 10/21/2016 1151       Impression and Plan: Jesse [name] is a very pleasant 79yo caucasian gentleman with polycythemia, JAK-2 negative. We will do a partial phlebotomy only removing 250 ml today for Hct 45.6%.  We will plan to see him again in 8 weeks per his request due to cost.  He can contact our office with any questions or concerns.   Laverna Peace, NP 8/27/202111:30 AM

## 2020-05-22 LAB — IRON AND TIBC
Iron: 53 ug/dL (ref 42–163)
Saturation Ratios: 14 % — ABNORMAL LOW (ref 20–55)
TIBC: 393 ug/dL (ref 202–409)
UIBC: 339 ug/dL (ref 117–376)

## 2020-05-22 LAB — FERRITIN: Ferritin: 17 ng/mL — ABNORMAL LOW (ref 24–336)

## 2020-05-30 ENCOUNTER — Ambulatory Visit: Payer: Medicare Other | Admitting: Neurology

## 2020-06-13 ENCOUNTER — Ambulatory Visit: Payer: Medicare Other | Admitting: Neurology

## 2020-07-05 ENCOUNTER — Encounter: Payer: Self-pay | Admitting: *Deleted

## 2020-07-07 ENCOUNTER — Encounter: Payer: Self-pay | Admitting: Diagnostic Neuroimaging

## 2020-07-07 ENCOUNTER — Other Ambulatory Visit: Payer: Self-pay

## 2020-07-07 ENCOUNTER — Ambulatory Visit: Payer: Medicare Other | Admitting: Diagnostic Neuroimaging

## 2020-07-07 VITALS — BP 125/80 | HR 84 | Ht 66.0 in | Wt 154.8 lb

## 2020-07-07 DIAGNOSIS — M5416 Radiculopathy, lumbar region: Secondary | ICD-10-CM

## 2020-07-07 NOTE — Progress Notes (Signed)
[facility]  PATIENT: Jesse [name] DOB: 07-08-1941  REFERRING CLINICIAN: Wenda Low, MD HISTORY FROM: patient  REASON FOR VISIT: new consult    HISTORICAL  CHIEF COMPLAINT:  Chief Complaint  Patient presents with   New Patient (Initial Visit)    Referred by Dr. Milinda Pointer at the [facility] for nerve pain in left leg. Pain starts at ankle and shoots up towards hip. States this has been going on for at least a year.    room 7    alone    HISTORY OF PRESENT ILLNESS:   79 year old male here for evaluation of left leg numbness and pain.  Patient has had about 1 year of pain radiating from his left lower back to his left thigh, left calf and left foot.  Patient went to podiatry and PCP for evaluation and treatments.  Symptoms are persistent.  He feels some weakness in his left leg.  He has tried stretching exercise without relief.  He is tried gabapentin without relief.   REVIEW OF SYSTEMS: Full 14 system review of systems performed and negative with exception of: As per HPI.  ALLERGIES: Allergies  Allergen Reactions   Tramadol Nausea And Vomiting    HOME MEDICATIONS: Outpatient Medications Prior to Visit  Medication Sig Dispense Refill   acetaminophen (TYLENOL) 325 MG tablet Take 325 mg by mouth every 6 (six) hours as needed for moderate pain.      ALPRAZolam (XANAX) 1 MG tablet Take 0.5 mg by mouth at bedtime.   3   aspirin EC 81 MG tablet Take 81 mg by mouth daily.     fish oil-omega-3 fatty acids 1000 MG capsule Take 1 g by mouth daily.      gabapentin (NEURONTIN) 100 MG capsule Take 100 mg by mouth every morning.     lisinopril-hydrochlorothiazide (PRINZIDE,ZESTORETIC) 20-12.5 MG per tablet Take 1 tablet by mouth daily.       ondansetron (ZOFRAN ODT) 4 MG disintegrating tablet Take 1 tablet (4 mg total) by mouth every 8 (eight) hours as needed for up to 15 doses for nausea or vomiting. 15 tablet 0   simvastatin (ZOCOR) 20 MG tablet Take 20  mg by mouth daily at 6 PM.      traMADol (ULTRAM) 50 MG tablet Take 50 mg by mouth 2 (two) times daily as needed.     vitamin E (VITAMIN E) 1000 UNIT capsule Take 1,000 Units by mouth daily.     No facility-administered medications prior to visit.    PAST MEDICAL HISTORY: Past Medical History:  Diagnosis Date   Depression    Hypercholesteremia    Hypertension    HZ (herpes zoster) 09/02/2011   Neuritis    left foot   Polycythemia rubra vera ([place]) 09/02/2011    PAST SURGICAL HISTORY: Past Surgical History:  Procedure Laterality Date   GAS INSERTION  06/04/2012   Procedure: INSERTION OF GAS;  Surgeon: Hayden Pedro, MD;  Location: [place];  Service: Ophthalmology;  Laterality: Left;  [id number]   SCLERAL BUCKLE  06/04/2012   Procedure: SCLERAL BUCKLE;  Surgeon: Hayden Pedro, MD;  Location: [place];  Service: Ophthalmology;  Laterality: Left;  Headscope laser    FAMILY HISTORY: Family History  Problem Relation Age of Onset   Heart attack Mother     SOCIAL HISTORY: Social History   Socioeconomic History   Marital status: Single    Spouse name: Not on file   Number of children: Not on file  Years of education: Not on file   Highest education level: Not on file  Occupational History    Comment: retired  Tobacco Use   Smoking status: Never Smoker   Smokeless tobacco: Never Used   Tobacco comment: never used tobacco  Vaping Use   Vaping Use: Never used  Substance and Sexual Activity   Alcohol use: No    Alcohol/week: 0.0 standard drinks   Drug use: No   Sexual activity: Not Currently  Other Topics Concern   Not on file  Social History Narrative   Not on file   Social Determinants of Health   Financial Resource Strain:    Difficulty of Paying Living Expenses: Not on file  Food Insecurity:    Worried About Charity fundraiser in the Last Year: Not on file   [employer] of Food in the Last Year: Not on file  Transportation Needs:    Lack of  Transportation (Medical): Not on file   Lack of Transportation (Non-Medical): Not on file  Physical Activity:    Days of Exercise per Week: Not on file   Minutes of Exercise per Session: Not on file  Stress:    Feeling of Stress : Not on file  Social Connections:    Frequency of Communication with Friends and Family: Not on file   Frequency of Social Gatherings with Friends and Family: Not on file   Attends Religious Services: Not on file   Active Member of Clubs or Organizations: Not on file   Attends Archivist Meetings: Not on file   Marital Status: Not on file  Intimate Partner Violence:    Fear of Current or Ex-Partner: Not on file   Emotionally Abused: Not on file   Physically Abused: Not on file   Sexually Abused: Not on file     PHYSICAL EXAM  GENERAL EXAM/CONSTITUTIONAL: Vitals:  Vitals:   07/07/20 1122  BP: 125/80  Pulse: 84  Weight: 154 lb 12.8 oz (70.2 kg)  Height: 5\' 6"  (1.676 m)     Body mass index is 24.99 kg/m. Wt Readings from Last 3 Encounters:  07/07/20 154 lb 12.8 oz (70.2 kg)  05/19/20 150 lb 1.3 oz (68.1 kg)  04/18/20 151 lb 0.6 oz (68.5 kg)     Patient is in no distress; well developed, nourished and groomed; neck is supple  CARDIOVASCULAR:  Examination of carotid arteries is normal; no carotid bruits  Regular rate and rhythm, no murmurs  Examination of peripheral vascular system by observation and palpation is normal  EYES:  Ophthalmoscopic exam of optic discs and posterior segments is normal; no papilledema or hemorrhages  No exam data present  MUSCULOSKELETAL:  Gait, strength, tone, movements noted in Neurologic exam below  NEUROLOGIC: MENTAL STATUS:  No flowsheet data found.  awake, alert, oriented to person, place and time  recent and remote memory intact  normal attention and concentration  language fluent, comprehension intact, naming intact  fund of knowledge appropriate  CRANIAL  NERVE:   2nd - no papilledema on fundoscopic exam  2nd, 3rd, 4th, 6th - pupils equal and reactive to light, visual fields full to confrontation, extraocular muscles intact, no nystagmus  5th - facial sensation symmetric  7th - facial strength symmetric  8th - hearing intact  9th - palate elevates symmetrically, uvula midline  11th - shoulder shrug symmetric  12th - tongue protrusion midline  MOTOR:   normal bulk and tone, full strength in the BUE, BLE; EXCEPT LEFT  FOOT DORSIFLEXION WEAKNESS (4/5)  SENSORY:   normal and symmetric to light touch, pinprick, temperature, vibration; EXCEPT DECR IN LEFT FOOT  STRAIGHT LEG RAISE POSITIVE ON LEFT SIDE  COORDINATION:   finger-nose-finger, fine finger movements normal  REFLEXES:   deep tendon reflexes present and symmetric  GAIT/STATION:   narrow based gait; able to walk on toes, heels and tandem; EXCEPT SLIGHT DIFF WITH HEEL WALKING ON LEFT     DIAGNOSTIC DATA (LABS, IMAGING, TESTING) - I reviewed patient records, labs, notes, testing and imaging myself where available.  Lab Results  Component Value Date   WBC 7.1 05/19/2020   HGB 14.6 05/19/2020   HCT 45.6 05/19/2020   MCV 87.9 05/19/2020   PLT 303 05/19/2020      Component Value Date/Time   NA 133 (L) 05/19/2020 1054   NA 136 08/19/2017 1149   NA 135 (L) 10/21/2016 1151   K 4.0 05/19/2020 1054   K 3.8 08/19/2017 1149   K 3.9 10/21/2016 1151   CL 97 (L) 05/19/2020 1054   CL 95 (L) 08/19/2017 1149   CO2 30 05/19/2020 1054   CO2 28 08/19/2017 1149   CO2 25 10/21/2016 1151   GLUCOSE 100 (H) 05/19/2020 1054   GLUCOSE 114 08/19/2017 1149   BUN 14 05/19/2020 1054   BUN 14 08/19/2017 1149   BUN 13.6 10/21/2016 1151   CREATININE 0.88 05/19/2020 1054   CREATININE 1.0 08/19/2017 1149   CREATININE 0.8 10/21/2016 1151   CALCIUM 10.3 05/19/2020 1054   CALCIUM 10.1 08/19/2017 1149   CALCIUM 10.3 10/21/2016 1151   PROT 6.8 05/19/2020 1054   PROT 6.9 08/19/2017  1149   PROT 7.3 10/21/2016 1151   ALBUMIN 4.2 05/19/2020 1054   ALBUMIN 3.6 08/19/2017 1149   ALBUMIN 3.9 10/21/2016 1151   AST 13 (L) 05/19/2020 1054   AST 25 10/21/2016 1151   ALT 14 05/19/2020 1054   ALT 32 08/19/2017 1149   ALT 38 10/21/2016 1151   ALKPHOS 51 05/19/2020 1054   ALKPHOS 68 08/19/2017 1149   ALKPHOS 68 10/21/2016 1151   BILITOT 1.4 (H) 05/19/2020 1054   BILITOT 1.22 (H) 10/21/2016 1151   GFRNONAA >60 05/19/2020 1054   GFRAA >60 05/19/2020 1054   No results found for: CHOL, HDL, LDLCALC, LDLDIRECT, TRIG, CHOLHDL No results found for: HGBA1C No results found for: VITAMINB12 No results found for: TSH     ASSESSMENT AND PLAN  79 y.o. year old male here with left lower back pain rating to the left leg, with left leg weakness and numbness, since past 1 year.  Dx:  1. Left lumbar radiculopathy      PLAN:  LEFT LUMBAR RADICULOPATHY - check MRI lumbar spine (left leg weakness, left leg pain; 1 year; failed medications, injections and exercises)  Orders Placed This Encounter  Procedures   MR [place]   Return for pending if symptoms worsen or fail to improve.    Penni Bombard, MD 73/71/0626, 94:85 AM Certified in Neurology, Neurophysiology and Neuroimaging  [facility] [address] [place], [place] [address] [phone]

## 2020-07-07 NOTE — Patient Instructions (Signed)
-   check MRI lumbar spine 

## 2020-07-11 ENCOUNTER — Telehealth: Payer: Self-pay | Admitting: Diagnostic Neuroimaging

## 2020-07-11 NOTE — Telephone Encounter (Signed)
[employer] medicare order sent to GI. No auth they will reach out to the patient to schedule.  

## 2020-07-14 ENCOUNTER — Other Ambulatory Visit: Payer: Self-pay

## 2020-07-14 ENCOUNTER — Ambulatory Visit
Admission: RE | Admit: 2020-07-14 | Discharge: 2020-07-14 | Disposition: A | Discharge status: Home / Self Care | Payer: Medicare Other | Source: Ambulatory Visit | Attending: Diagnostic Neuroimaging | Admitting: Diagnostic Neuroimaging

## 2020-07-14 DIAGNOSIS — M5416 Radiculopathy, lumbar region: Secondary | ICD-10-CM | POA: Diagnosis not present

## 2020-07-19 ENCOUNTER — Telehealth: Payer: Self-pay | Admitting: Hematology & Oncology

## 2020-07-19 ENCOUNTER — Inpatient Hospital Stay: Payer: Medicare Other

## 2020-07-19 ENCOUNTER — Other Ambulatory Visit: Payer: Self-pay

## 2020-07-19 ENCOUNTER — Inpatient Hospital Stay: Payer: Medicare Other | Attending: Family | Admitting: Family

## 2020-07-19 ENCOUNTER — Encounter: Payer: Self-pay | Admitting: Family

## 2020-07-19 VITALS — BP 112/66 | HR 94 | Resp 18

## 2020-07-19 VITALS — BP 136/78 | HR 89 | Temp 98.5°F | Resp 18 | Ht 66.0 in | Wt 155.1 lb

## 2020-07-19 DIAGNOSIS — Z23 Encounter for immunization: Secondary | ICD-10-CM | POA: Diagnosis not present

## 2020-07-19 DIAGNOSIS — D500 Iron deficiency anemia secondary to blood loss (chronic): Secondary | ICD-10-CM

## 2020-07-19 DIAGNOSIS — D45 Polycythemia vera: Secondary | ICD-10-CM | POA: Insufficient documentation

## 2020-07-19 DIAGNOSIS — D751 Secondary polycythemia: Secondary | ICD-10-CM | POA: Diagnosis not present

## 2020-07-19 LAB — CBC WITH DIFFERENTIAL (CANCER CENTER ONLY)
Abs Immature Granulocytes: 0.03 10*3/uL (ref 0.00–0.07)
Basophils Absolute: 0 10*3/uL (ref 0.0–0.1)
Basophils Relative: 0 %
Eosinophils Absolute: 0 10*3/uL (ref 0.0–0.5)
Eosinophils Relative: 1 %
HCT: 47.4 % (ref 39.0–52.0)
Hemoglobin: 15.1 g/dL (ref 13.0–17.0)
Immature Granulocytes: 0 %
Lymphocytes Relative: 22 %
Lymphs Abs: 2 10*3/uL (ref 0.7–4.0)
MCH: 27.7 pg (ref 26.0–34.0)
MCHC: 31.9 g/dL (ref 30.0–36.0)
MCV: 86.8 fL (ref 80.0–100.0)
Monocytes Absolute: 1.2 10*3/uL — ABNORMAL HIGH (ref 0.1–1.0)
Monocytes Relative: 14 %
Neutro Abs: 5.6 10*3/uL (ref 1.7–7.7)
Neutrophils Relative %: 63 %
Platelet Count: 319 10*3/uL (ref 150–400)
RBC: 5.46 MIL/uL (ref 4.22–5.81)
RDW: 15.1 % (ref 11.5–15.5)
WBC Count: 8.8 10*3/uL (ref 4.0–10.5)
nRBC: 0 % (ref 0.0–0.2)

## 2020-07-19 LAB — CMP (CANCER CENTER ONLY)
ALT: 14 U/L (ref 0–44)
AST: 14 U/L — ABNORMAL LOW (ref 15–41)
Albumin: 4.2 g/dL (ref 3.5–5.0)
Alkaline Phosphatase: 54 U/L (ref 38–126)
Anion gap: 8 (ref 5–15)
BUN: 15 mg/dL (ref 8–23)
CO2: 29 mmol/L (ref 22–32)
Calcium: 10.4 mg/dL — ABNORMAL HIGH (ref 8.9–10.3)
Chloride: 94 mmol/L — ABNORMAL LOW (ref 98–111)
Creatinine: 0.91 mg/dL (ref 0.61–1.24)
GFR, Estimated: >60 mL/min (ref >60)
Glucose, Bld: 113 mg/dL — ABNORMAL HIGH (ref 70–99)
Potassium: 3.7 mmol/L (ref 3.5–5.1)
Sodium: 131 mmol/L — ABNORMAL LOW (ref 135–145)
Total Bilirubin: 1 mg/dL (ref 0.3–1.2)
Total Protein: 7.1 g/dL (ref 6.5–8.1)

## 2020-07-19 MED ORDER — INFLUENZA VAC SPLIT QUAD 0.5 ML IM SUSY
PREFILLED_SYRINGE | INTRAMUSCULAR | Status: AC
  Filled 2020-07-19: qty 0.5

## 2020-07-19 MED ORDER — INFLUENZA VAC SPLIT QUAD 0.5 ML IM SUSY
0.5000 mL | PREFILLED_SYRINGE | Freq: Once | INTRAMUSCULAR | Status: AC
  Administered 2020-07-19: 0.5 mL via INTRAMUSCULAR

## 2020-07-19 NOTE — Progress Notes (Signed)
Pt. Stable and asymptomatic at discharge.

## 2020-07-19 NOTE — Progress Notes (Signed)
Angie Fava presents today for phlebotomy per MD orders. Phlebotomy procedure started at 1151 and ended at 1158. 516 cc removed via 16 G needle at L AC side. Patient tolerated procedure well.

## 2020-07-19 NOTE — Telephone Encounter (Signed)
Appointments scheduled calendar printed & mailed per 10/27 los 

## 2020-07-19 NOTE — Progress Notes (Signed)
Hematology and Oncology Follow Up Visit  Jesse [name] [id number] [birth year] 79 y.o. 07/19/2020   Principle Diagnosis:  Polycythemia vera - JAK2 negative  Current Therapy: Phlebotomy to maintain hematocrit below 45% Aspirin 81 mg by mouth daily   Interim History:  Jesse [name] is here today for follow-up. Hct today is 47.4%. He states that he feels fatigued at times.  He has been having pain in his lower back with neuropathy in his lower extremities. He had an MRI done with his neurologist Dr. Leta Baptist  No fever, chills, n/v, cough, rash, dizziness, SOB, chest pain, palpitations, abdominal pain or changes in bowel or bladder habits.  No episodes of bleeding. No abnormal bruising or petechiae.  No swelling in his extremities, pedal pulses 2+.  No falls or syncopal episodes to report.  He is eating well and staying well hydrated. His weight is stable.   ECOG Performance Status: 1 - Symptomatic but completely ambulatory  Medications:  Allergies as of 07/19/2020      Reactions   Tramadol Nausea And Vomiting      Medication List       Accurate as of July 19, 2020 12:37 PM. If you have any questions, ask your nurse or doctor.        acetaminophen 325 MG tablet Commonly known as: TYLENOL Take 325 mg by mouth every 6 (six) hours as needed for moderate pain.   ALPRAZolam 1 MG tablet Commonly known as: XANAX Take 0.5 mg by mouth at bedtime.   aspirin EC 81 MG tablet Take 81 mg by mouth daily.   fish oil-omega-3 fatty acids 1000 MG capsule Take 1 g by mouth daily.   gabapentin 100 MG capsule Commonly known as: NEURONTIN Take 100 mg by mouth every morning.   lisinopril-hydrochlorothiazide 20-12.5 MG tablet Commonly known as: ZESTORETIC Take 1 tablet by mouth daily.   ondansetron 4 MG disintegrating tablet Commonly known as: Zofran ODT Take 1 tablet (4 mg total) by mouth every 8 (eight) hours as needed for up to 15 doses for nausea or vomiting.     simvastatin 20 MG tablet Commonly known as: ZOCOR Take 20 mg by mouth daily at 6 PM.   traMADol 50 MG tablet Commonly known as: ULTRAM Take 50 mg by mouth 2 (two) times daily as needed.   vitamin E 1000 UNIT capsule Generic drug: vitamin E Take 1,000 Units by mouth daily.       Allergies:  Allergies  Allergen Reactions  . Tramadol Nausea And Vomiting    Past Medical History, Surgical history, Social history, and Family History were reviewed and updated.  Review of Systems: All other 10 point review of systems is negative.   Physical Exam:  height is 5\' 6"  (1.676 m) and weight is 155 lb 1.9 oz (70.4 kg). His oral temperature is 98.5 F (36.9 C). His blood pressure is 136/78 and his pulse is 89. His respiration is 18 and oxygen saturation is 99%.   Wt Readings from Last 3 Encounters:  07/19/20 155 lb 1.9 oz (70.4 kg)  07/07/20 154 lb 12.8 oz (70.2 kg)  05/19/20 150 lb 1.3 oz (68.1 kg)    Ocular: Sclerae unicteric, pupils equal, round and reactive to light Ear-nose-throat: Oropharynx clear, dentition fair Lymphatic: No cervical or supraclavicular adenopathy Lungs no rales or rhonchi, good excursion bilaterally Heart regular rate and rhythm, no murmur appreciated Abd soft, nontender, positive bowel sounds MSK no focal spinal tenderness, no joint edema Neuro: non-focal, well-oriented, appropriate affect Breasts:  Deferred   Lab Results  Component Value Date   WBC 8.8 07/19/2020   HGB 15.1 07/19/2020   HCT 47.4 07/19/2020   MCV 86.8 07/19/2020   PLT 319 07/19/2020   Lab Results  Component Value Date   FERRITIN 17 (L) 05/19/2020   IRON 53 05/19/2020   TIBC 393 05/19/2020   UIBC 339 05/19/2020   IRONPCTSAT 14 (L) 05/19/2020   Lab Results  Component Value Date   RETICCTPCT 1.5 01/11/2020   RBC 5.46 07/19/2020   RETICCTABS 87.5 01/02/2011   No results found for: KPAFRELGTCHN, LAMBDASER, KAPLAMBRATIO No results found for: Kandis Cocking, IGMSERUM No results  found for: Kathrynn Ducking, MSPIKE, SPEI   Chemistry      Component Value Date/Time   NA 131 (L) 07/19/2020 1113   NA 136 08/19/2017 1149   NA 135 (L) 10/21/2016 1151   K 3.7 07/19/2020 1113   K 3.8 08/19/2017 1149   K 3.9 10/21/2016 1151   CL 94 (L) 07/19/2020 1113   CL 95 (L) 08/19/2017 1149   CO2 29 07/19/2020 1113   CO2 28 08/19/2017 1149   CO2 25 10/21/2016 1151   BUN 15 07/19/2020 1113   BUN 14 08/19/2017 1149   BUN 13.6 10/21/2016 1151   CREATININE 0.91 07/19/2020 1113   CREATININE 1.0 08/19/2017 1149   CREATININE 0.8 10/21/2016 1151      Component Value Date/Time   CALCIUM 10.4 (H) 07/19/2020 1113   CALCIUM 10.1 08/19/2017 1149   CALCIUM 10.3 10/21/2016 1151   ALKPHOS 54 07/19/2020 1113   ALKPHOS 68 08/19/2017 1149   ALKPHOS 68 10/21/2016 1151   AST 14 (L) 07/19/2020 1113   AST 25 10/21/2016 1151   ALT 14 07/19/2020 1113   ALT 32 08/19/2017 1149   ALT 38 10/21/2016 1151   BILITOT 1.0 07/19/2020 1113   BILITOT 1.22 (H) 10/21/2016 1151       Impression and Plan: Jesse [name] is a very pleasant 79yo caucasian gentleman with polycythemia, JAK-2 negative. We will proceed with phlebotomy today as planned.  Follow-up in 8 weeks.  He was encouraged to contact our office with any questions or concerns.   Laverna Peace, NP 10/27/202112:37 PM

## 2020-07-19 NOTE — Patient Instructions (Signed)
Therapeutic Phlebotomy, Care After This sheet gives you information about how to care for yourself after your procedure. Your health care provider may also give you more specific instructions. If you have problems or questions, contact your health care provider. What can I expect after the procedure? After the procedure, it is common to have:  Light-headedness or dizziness. You may feel faint.  Nausea.  Tiredness (fatigue). Follow these instructions at home: Eating and drinking  Be sure to eat well-balanced meals for the next 24 hours.  Drink enough fluid to keep your urine pale yellow.  Avoid drinking alcohol on the day that you had the procedure. Activity   Return to your normal activities as told by your health care provider. Most people can go back to their normal activities right away.  Avoid activities that take a lot of effort for about 5 hours after the procedure. Athletes should avoid strenuous exercise for at least 12 hours.  Avoid heavy lifting or pulling for about 5 hours after the procedure. Do not lift anything that is heavier than 10 lb (4.5 kg).  Change positions slowly for the remainder of the day. This will help to prevent light-headedness or fainting.  If you feel light-headed, lie down until the feeling goes away. Needle insertion site care   Keep your bandage (dressing) dry. You can remove the bandage after about 5 hours or as told by your health care provider.  If you have bleeding from the needle insertion site, raise (elevate) your arm and press firmly on the site until the bleeding stops.  If you have bruising at the site, apply ice to the area: ? Remove the dressing. ? Put ice in a plastic bag. ? Place a towel between your skin and the bag. ? Leave the ice on for 20 minutes, 2-3 times a day for the first 24 hours.  If the swelling does not go away after 24 hours, apply a warm, moist cloth (warm compress) to the area for 20 minutes, 2-3 times a  day. General instructions  Do not use any products that contain nicotine or tobacco, such as cigarettes and e-cigarettes, for at least 30 minutes after the procedure.  Keep all follow-up visits as told by your health care provider. This is important. You may need to continue having regular therapeutic phlebotomy treatments as directed. Contact a health care provider if you:  Have redness, swelling, or pain at the needle insertion site.  Have fluid or blood coming from the needle insertion site.  Have pus or a bad smell coming from the needle insertion site.  Notice that the needle insertion site feels warm to the touch.  Feel light-headed, dizzy, or nauseous, and the feeling does not go away.  Have new bruising at the needle insertion site.  Feel weaker than normal.  Have a fever or chills. Get help right away if:  You faint.  You have chest pain.  You have trouble breathing.  You have severe nausea or vomiting. Summary  After the procedure, it is common to have some light-headedness, dizziness, nausea, or tiredness (fatigue).  Be sure to eat well-balanced meals for the next 24 hours. Drink enough fluid to keep your urine pale yellow.  Return to your normal activities as told by your health care provider.  Keep all follow-up visits as told by your health care provider. You may need to continue having regular therapeutic phlebotomy treatments as directed. This information is not intended to replace advice given to you   by your health care provider. Make sure you discuss any questions you have with your health care provider. Document Revised: 09/26/2017 Document Reviewed: 09/25/2017 Elsevier Patient Education  [place].  Influenza Virus Vaccine (Flucelvax) What is this medicine? INFLUENZA VIRUS VACCINE (in floo EN zuh VAHY ruhs vak SEEN) helps to reduce the risk of getting influenza also known as the flu. The vaccine only helps protect you against some strains of  the flu. This medicine may be used for other purposes; ask your health care provider or pharmacist if you have questions. COMMON BRAND NAME(S): FLUCELVAX What should I tell my health care provider before I take this medicine? They need to know if you have any of these conditions:  bleeding disorder like hemophilia  fever or infection  Guillain-Barre syndrome or other neurological problems  immune system problems  infection with the human immunodeficiency virus (HIV) or AIDS  low blood platelet counts  multiple sclerosis  an unusual or allergic reaction to influenza virus vaccine, other medicines, foods, dyes or preservatives  pregnant or trying to get pregnant  breast-feeding How should I use this medicine? This vaccine is for injection into a muscle. It is given by a health care professional. A copy of Vaccine Information Statements will be given before each vaccination. Read this sheet carefully each time. The sheet may change frequently. Talk to your pediatrician regarding the use of this medicine in children. Special care may be needed. Overdosage: If you think you've taken too much of this medicine contact a poison control center or emergency room at once. Overdosage: If you think you have taken too much of this medicine contact a poison control center or emergency room at once. NOTE: This medicine is only for you. Do not share this medicine with others. What if I miss a dose? This does not apply. What may interact with this medicine?  chemotherapy or radiation therapy  medicines that lower your immune system like etanercept, anakinra, infliximab, and adalimumab  medicines that treat or prevent blood clots like warfarin  phenytoin  steroid medicines like prednisone or cortisone  theophylline  vaccines This list may not describe all possible interactions. Give your health care provider a list of all the medicines, herbs, non-prescription drugs, or dietary  supplements you use. Also tell them if you smoke, drink alcohol, or use illegal drugs. Some items may interact with your medicine. What should I watch for while using this medicine? Report any side effects that do not go away within 3 days to your doctor or health care professional. Call your health care provider if any unusual symptoms occur within 6 weeks of receiving this vaccine. You may still catch the flu, but the illness is not usually as bad. You cannot get the flu from the vaccine. The vaccine will not protect against colds or other illnesses that may cause fever. The vaccine is needed every year. What side effects may I notice from receiving this medicine? Side effects that you should report to your doctor or health care professional as soon as possible:  allergic reactions like skin rash, itching or hives, swelling of the face, lips, or tongue Side effects that usually do not require medical attention (Report these to your doctor or health care professional if they continue or are bothersome.):  fever  headache  muscle aches and pains  pain, tenderness, redness, or swelling at the injection site  tiredness This list may not describe all possible side effects. Call your doctor for medical advice  about side effects. You may report side effects to FDA at 1-800-FDA-1088. Where should I keep my medicine? The vaccine will be given by a health care professional in a clinic, pharmacy, doctor's office, or other health care setting. You will not be given vaccine doses to store at home. NOTE: This sheet is a summary. It may not cover all possible information. If you have questions about this medicine, talk to your doctor, pharmacist, or health care provider.  2020 Elsevier/Gold Standard (2011-08-21 14:06:47)

## 2020-07-20 LAB — IRON AND TIBC
Iron: 47 ug/dL (ref 42–163)
Saturation Ratios: 11 % — ABNORMAL LOW (ref 20–55)
TIBC: 414 ug/dL — ABNORMAL HIGH (ref 202–409)
UIBC: 367 ug/dL (ref 117–376)

## 2020-07-20 LAB — FERRITIN: Ferritin: 17 ng/mL — ABNORMAL LOW (ref 24–336)

## 2020-08-21 ENCOUNTER — Telehealth: Payer: Self-pay | Admitting: *Deleted

## 2020-08-21 DIAGNOSIS — M5416 Radiculopathy, lumbar region: Secondary | ICD-10-CM

## 2020-08-21 NOTE — Telephone Encounter (Signed)
LVM requesting call back.

## 2020-08-22 ENCOUNTER — Telehealth: Payer: Self-pay

## 2020-08-22 NOTE — Telephone Encounter (Addendum)
Spoke with patient and informed him the MRI lumbar spine showed degenerative changes and pinched nerves. He can consider spine surgery consult vs pain management / PT evaluation. He would like to try PT first before seeing neurosurgeon. I advised the referral will be placed, and he'll get a call to schedule appointment. Patient verbalized understanding, appreciation. Referral placed.

## 2020-08-22 NOTE — Addendum Note (Signed)
Addended by: Minna Antis on: 08/22/2020 01:12 PM   Modules accepted: Orders

## 2020-08-22 NOTE — Telephone Encounter (Signed)
Pts wife had called to chk on appts, left a vm with 09/18/20 appt.... AOM

## 2020-09-18 ENCOUNTER — Inpatient Hospital Stay: Payer: Medicare Other

## 2020-09-18 ENCOUNTER — Inpatient Hospital Stay: Payer: Medicare Other | Admitting: Family

## 2020-09-19 ENCOUNTER — Ambulatory Visit: Payer: Medicare Other | Attending: Diagnostic Neuroimaging | Admitting: Physical Therapy

## 2020-09-19 ENCOUNTER — Other Ambulatory Visit: Payer: Self-pay

## 2020-09-19 ENCOUNTER — Encounter: Payer: Self-pay | Admitting: Physical Therapy

## 2020-09-19 DIAGNOSIS — M5442 Lumbago with sciatica, left side: Secondary | ICD-10-CM | POA: Diagnosis not present

## 2020-09-19 DIAGNOSIS — G8929 Other chronic pain: Secondary | ICD-10-CM | POA: Diagnosis present

## 2020-09-19 DIAGNOSIS — R208 Other disturbances of skin sensation: Secondary | ICD-10-CM | POA: Diagnosis present

## 2020-09-19 DIAGNOSIS — M6281 Muscle weakness (generalized): Secondary | ICD-10-CM

## 2020-09-19 NOTE — Therapy (Signed)
[name] [address] [place], [place], [address] Phone: [phone]   Fax:  [phone]  Physical Therapy Evaluation  Patient Details  Name: Jesse [name] MRN: [id number] Date of Birth: 09/29/1940 Referring Provider (PT): Andrey Spearman, MD   Encounter Date: 09/19/2020   PT End of Session - 09/19/20 1426    Visit Number 1    Number of Visits 9    Date for PT Re-Evaluation 11/18/20   written for 4 week POC   Authorization Type [employer] Medicare    PT Start Time 0930    PT Stop Time 1013    PT Time Calculation (min) 43 min    Equipment Utilized During Treatment Gait belt    Activity Tolerance Patient tolerated treatment well    Behavior During Therapy [facility] for tasks assessed/performed           Past Medical History:  Diagnosis Date   Depression    Hypercholesteremia    Hypertension    HZ (herpes zoster) 09/02/2011   Neuritis    left foot   Polycythemia rubra vera ([place]) 09/02/2011    Past Surgical History:  Procedure Laterality Date   GAS INSERTION  06/04/2012   Procedure: INSERTION OF GAS;  Surgeon: Hayden Pedro, MD;  Location: [facility];  Service: Ophthalmology;  Laterality: Left;  [id number]   SCLERAL BUCKLE  06/04/2012   Procedure: SCLERAL BUCKLE;  Surgeon: Hayden Pedro, MD;  Location: [facility];  Service: Ophthalmology;  Laterality: Left;  Headscope laser    There were no vitals filed for this visit.    Subjective Assessment - 09/19/20 0931    Subjective Patient has had about 1 year of pain radiating from his left lower back to his left thigh, left calf and left foot. Reports it eases up when he takes gabapentin. Also reports some numbness in L leg. Reports pain feels sharp and aching. Not sure what makes his pain worse. Sometimes pain will hit him and he just has to stop. Can't play golf or sports anymore. Reports worse pain can be a 6-7/10.    Pertinent History PMH: HTN    Limitations Walking     Diagnostic tests MRI from 10/24: 2.    At L4-L5, degenerative changes cause moderately severe left foraminal narrowing and severe left lateral recess stenosis with potential for left L4 and left L5 nerve root compression.  3.    At L5-S1, degenerative changes cause moderately severe right lateral recess stenosis with potential for right S1 nerve root compression.    Patient Stated Goals wants to get rid of the pain.    Currently in Pain? No/denies              [facility] PT Assessment - 09/19/20 0936      Assessment   Medical Diagnosis L lumbar radiculopathy    Referring Provider (PT) Andrey Spearman, MD    Onset Date/Surgical Date 09/20/19   approx. 1 year ago, date of referral 08/22/20   Prior Therapy none      Balance Screen   Has the patient fallen in the past 6 months No    Has the patient had a decrease in activity level because of a fear of falling?  No    Is the patient reluctant to leave their home because of a fear of falling?  No      Home [employer] residence    Living Arrangements Other (Comment)  lives with sister, is her caregiver   Type of Home House    Home Access Stairs to enter    Entrance Stairs-Number of Steps 6 or 7    Additional Comments pt reports difficulty with stairs      Prior Function   Level of Independence Independent    Leisure playing golf      Sensation   Light Touch Impaired Detail    Light Touch Impaired Details Impaired LLE    Additional Comments incr difficulty to detect L lateral leg and ankle (S1 dermatome) less sensation with LLE compared to R      Coordination   Gross Motor Movements are Fluid and Coordinated Yes      ROM / Strength   AROM / PROM / Strength Strength;AROM      AROM   Overall AROM Comments supine limited hamstring PROM B to approx. 45 deg    AROM Assessment Site Lumbar    Lumbar Flexion able to touch toes with incr B knee flexion, incr in L LBP    Lumbar Extension 50% limited   no  change in pain with repeated lumbar extension   Lumbar - Right Side Bend 25% limited   mild pain   Lumbar - Left Side Bend --   mild pain, with compensatory trunk extension     Strength   Strength Assessment Site Knee;Ankle;Hip    Right/Left Hip Right;Left    Right Hip Flexion 5/5    Right Hip Extension 4-/5    Right Hip ABduction 4-/5    Left Hip Flexion 4-/5    Left Hip Extension 3/5    Left Hip ABduction 3/5    Right/Left Knee Right;Left    Right Knee Flexion 5/5    Right Knee Extension 5/5    Left Knee Flexion 4-/5    Left Knee Extension 4/5    Right/Left Ankle Right;Left    Right Ankle Dorsiflexion 5/5    Left Ankle Dorsiflexion 4+/5    Left Ankle Plantar Flexion 3/5      Palpation   Spinal mobility hypomobility central L1-L5, pt reports incr discomfort with palptation to L L1-L5    Palpation comment incr TTP to L lower lumbar paraspinals, L piriformis      Special Tests    Special Tests Lumbar    Lumbar Tests Slump Test;Straight Leg Raise;Prone Knee Bend Test      Slump test   Findings Positive    Side Left    Comment with ankle DF, reports shooting pain (esp in L lateral side of calf), negative on R      Prone Knee Bend Test   Findings Positive    Side Left    Comment reports incr pain to L lateral calf, incr tightness B with pt reporting a stretch in B quads      Straight Leg Raise   Findings Positive    Side  Left    Comment incr pain in L side of calf                      Objective measurements completed on examination: See above findings.               PT Education - 09/19/20 1426    Education Details clinical findings, POC    Person(s) Educated Patient    Methods Explanation    Comprehension Verbalized understanding            PT Short  Term Goals - 09/19/20 1433      PT SHORT TERM GOAL #1   Title ALL STGS = LTGS             PT Long Term Goals - 09/19/20 1433      PT LONG TERM GOAL #1   Title Pt will be  independent with final HEP in order to build upon functional gains made in therapy. ALL LTGS DUE 10/17/20    Time 4    Period Weeks    Status New    Target Date 10/17/20      PT LONG TERM GOAL #2   Title Pt will undergo assessment of ODI with LTG to be written as appropriate.    Time 4    Period Weeks    Status New      PT LONG TERM GOAL #3   Title Pt will perform 8 steps with single handrail mod I with reports of no low back pain in order to safely enter/exit home.    Time 4    Period Weeks    Status New      PT LONG TERM GOAL #4   Title Pt will report low back pain at its worst to a 3/10 or less in order to return to golf.    Baseline 6-7/10 pain at its worst.    Time 4    Period Weeks    Status New      PT LONG TERM GOAL #5   Title Pt will ambulate at least 1,000' over outdoor unlevel surfaces with mod I and low back pain of 2/10 or less in order to improve functional mobility without needing to stop and take rest breaks.    Time 4    Period Weeks    Status New                  Plan - 09/19/20 1428    Clinical Impression Statement Patient is a 79 year old male referred to Neuro OPPT for L lumbar radiculopathy. Patient has had about 1 year of pain radiating from his left lower back to his left thigh, left calf and left foot. Pt's PMH is significant for: HTN, Polycythemia rubra vera. The following deficits were present during the exam:  L low back pain with radiating symptoms down L leg (more notably in S1 dermatome), impaired sensation, decr BLE strength, positive SLUMP and SLR test on LLE, hypomobility of lumbar spine (central and L facet joints with incr TTP), decr lumbar AROM wit incr pain. Pt would benefit from skilled PT to address these impairments and functional limitations to maximize functional mobility independence and decr pts low back pain.    Personal Factors and Comorbidities Comorbidity 2;Past/Current Experience;Time since onset of  injury/illness/exacerbation    Comorbidities PMH: HTN, Polycythemia rubra vera    Examination-Activity Limitations Locomotion Level;Stairs    Examination-Participation Restrictions Community Activity   playing golf   Stability/Clinical Decision Making Stable/Uncomplicated    Rehab Potential Good    PT Frequency 2x / week    PT Duration 4 weeks    PT Treatment/Interventions ADLs/Self Care Home Management;Gait training;Stair training;Functional mobility training;Therapeutic activities;Therapeutic exercise;Balance training;Patient/family education;Neuromuscular re-education;Manual techniques;Passive range of motion    PT Next Visit Plan perform ODI. initial HEP - nerve glides, hamstring and piriformis stretching, glute/ABD strength, core stabilization. warm up on NuStep/SciFit.    Consulted and Agree with Plan of Care Patient           Patient  will benefit from skilled therapeutic intervention in order to improve the following deficits and impairments:  Decreased activity tolerance,Decreased range of motion,Decreased mobility,Decreased strength,Difficulty walking,Pain,Improper body mechanics,Impaired sensation,Increased muscle spasms,Hypomobility  Visit Diagnosis: Chronic left-sided low back pain with left-sided sciatica  Muscle weakness (generalized)  Other disturbances of skin sensation     Problem List Patient Active Problem List   Diagnosis Date Noted   Need for prophylactic vaccination and inoculation against influenza 06/17/2016   Polycythemia rubra vera ([facility]) 09/02/2011   HZ (herpes zoster) 09/02/2011    Drake Leach, PT, DPT  09/19/2020, 4:35 PM  [facility] [address] [place], [place], [address] Phone: [phone]   Fax:  [phone]  Name: Jesse [name] MRN: [id number] Date of Birth: Apr 24, 1941

## 2020-09-20 ENCOUNTER — Ambulatory Visit: Payer: Medicare Other | Admitting: Physical Therapy

## 2020-09-20 ENCOUNTER — Encounter: Payer: Self-pay | Admitting: Physical Therapy

## 2020-09-20 DIAGNOSIS — M6281 Muscle weakness (generalized): Secondary | ICD-10-CM

## 2020-09-20 DIAGNOSIS — M5442 Lumbago with sciatica, left side: Secondary | ICD-10-CM | POA: Diagnosis not present

## 2020-09-20 DIAGNOSIS — G8929 Other chronic pain: Secondary | ICD-10-CM

## 2020-09-20 DIAGNOSIS — R208 Other disturbances of skin sensation: Secondary | ICD-10-CM

## 2020-09-20 NOTE — Therapy (Signed)
[facility] [address] [place], [place], [address] Phone: [phone]   Fax:  [phone]  Physical Therapy Treatment  Patient Details  Name: Jesse [name] MRN: [id number] Date of Birth: 1941-04-27 Referring Provider (PT): Andrey Spearman, MD   Encounter Date: 09/20/2020   PT End of Session - 09/20/20 1213    Visit Number 2    Number of Visits 9    Date for PT Re-Evaluation 11/18/20   written for 4 week POC   Authorization Type U[employer] Medicare    PT Start Time 1104    PT Stop Time 1146    PT Time Calculation (min) 42 min    Equipment Utilized During Treatment Gait belt    Activity Tolerance Patient tolerated treatment well    Behavior During Therapy [facility] for tasks assessed/performed           Past Medical History:  Diagnosis Date  . Depression   . Hypercholesteremia   . Hypertension   . HZ (herpes zoster) 09/02/2011  . Neuritis    left foot  . Polycythemia rubra vera ([place]) 09/02/2011    Past Surgical History:  Procedure Laterality Date  . GAS INSERTION  06/04/2012   Procedure: INSERTION OF GAS;  Surgeon: Hayden Pedro, MD;  Location: [employer];  Service: Ophthalmology;  Laterality: Left;  C3F8  . SCLERAL BUCKLE  06/04/2012   Procedure: SCLERAL BUCKLE;  Surgeon: Hayden Pedro, MD;  Location: [facility];  Service: Ophthalmology;  Laterality: Left;  Headscope laser    There were no vitals filed for this visit.   Subjective Assessment - 09/20/20 1105    Subjective No changes since he was here yesterday.    Pertinent History PMH: HTN    Limitations Walking    Diagnostic tests MRI from 10/24: 2.    At L4-L5, degenerative changes cause moderately severe left foraminal narrowing and severe left lateral recess stenosis with potential for left L4 and left L5 nerve root compression.  3.    At L5-S1, degenerative changes cause moderately severe right lateral recess stenosis with potential for right S1 nerve root  compression.    Patient Stated Goals wants to get rid of the pain.    Currently in Pain? No/denies              [facility] PT Assessment - 09/20/20 1212      Observation/Other Assessments   Other Surveys  Oswestry Disability Index    Oswestry Disability Index  20% = minimal disability                         [facility] Adult PT Treatment/Exercise - 09/20/20 1117      Exercises   Exercises Other Exercises    Other Exercises  SciFit with BLE and BUE for ROM and aerobic warmup at start of session before gear 2.5 for 5 minutes            Initiated HEP:   Access Code: [email or web address] URL: [email or web address] Date: 09/20/2020 Prepared by: Janann August  Exercises Seated Hamstring Stretch - 1-2 x daily - 5 x weekly - 3 sets - 25-30 hold - needing verbal and demo cues for technique  Supine Figure 4 Piriformis Stretch - 1-2 x daily - 5 x weekly - 3 sets - 30 hold - verbal and demo cues for technique, performed on both R and L  Seated Ankle Pumps - 1-2 x daily - 5 x weekly -  3 sets - 2 reps - modified slump nerve glide, with pt seated with slumped posture, L leg extended and performing ankle pumps  Lower Trunk Rotations - 1-2 x daily - 5 x weekly - 2 sets - 10 reps Prone Press Up on Elbows - 1 x daily - 5 x weekly - 1-2 sets - 10 reps - verbal and demo cues for technique  Attempted another version of a seated sciatic/slump nerve glide with knee flexion/extension, pt unable to sequence at this time despite verbal and demo cues. Will try again at another session.   Perform single knee to chest stretch on L x30 seconds and double knee to chest stretch 2 x 30 seconds, pt reporting not really feeling a stretch, did not add to HEP.       PT Education - 09/20/20 1213    Education Details initial HEP, results of oswestry.    Person(s) Educated Patient    Methods Explanation;Demonstration;Verbal cues;Tactile cues;Handout    Comprehension Verbalized  understanding;Returned demonstration;Need further instruction            PT Short Term Goals - 09/19/20 1433      PT SHORT TERM GOAL #1   Title ALL STGS = LTGS             PT Long Term Goals - 09/20/20 1219      PT LONG TERM GOAL #1   Title Pt will be independent with final HEP in order to build upon functional gains made in therapy. ALL LTGS DUE 10/17/20    Time 4    Period Weeks    Status New      PT LONG TERM GOAL #2   Title Pt will decr ODI score to 14% or less in order to demo decr disability related to back pain.    Baseline 20% on 09/20/20    Time 4    Period Weeks    Status Revised      PT LONG TERM GOAL #3   Title Pt will perform 8 steps with single handrail mod I with reports of no low back pain in order to safely enter/exit home.    Time 4    Period Weeks    Status New      PT LONG TERM GOAL #4   Title Pt will report low back pain at its worst to a 3/10 or less in order to return to golf.    Baseline 6-7/10 pain at its worst.    Time 4    Period Weeks    Status New      PT LONG TERM GOAL #5   Title Pt will ambulate at least 1,000' over outdoor unlevel surfaces with mod I and low back pain of 2/10 or less in order to improve functional mobility without needing to stop and take rest breaks.    Time 4    Period Weeks    Status New                 Plan - 09/20/20 1221    Clinical Impression Statement Performed the ODI today with pt scoring 20%, indicating pt is at minimal disability in relation to back pain. LTG revised as appropriate. Initiated HEP today to include hamstring and low back stretches as well as a modified seated sciatic nerve glide. Needed verbal and demo cues for proper technique. Pt tolerated well. Will continue to progress towards LTGs.    Personal Factors and Comorbidities Comorbidity 2;Past/Current Experience;Time since  onset of injury/illness/exacerbation    Comorbidities PMH: HTN, Polycythemia rubra vera     Examination-Activity Limitations Locomotion Level;Stairs    Examination-Participation Restrictions Community Activity   playing golf   Stability/Clinical Decision Making Stable/Uncomplicated    Rehab Potential Good    PT Frequency 2x / week    PT Duration 4 weeks    PT Treatment/Interventions ADLs/Self Care Home Management;Gait training;Stair training;Functional mobility training;Therapeutic activities;Therapeutic exercise;Balance training;Patient/family education;Neuromuscular re-education;Manual techniques;Passive range of motion    PT Next Visit Plan how is HEP? review body mechanics for lifting. review prone press ups for trunk extension. sciatic nerve glides. hamstring and piriformis stretching, glute/ABD strength, core stabilization. warm up on NuStep/SciFit.    Consulted and Agree with Plan of Care Patient           Patient will benefit from skilled therapeutic intervention in order to improve the following deficits and impairments:  Decreased activity tolerance,Decreased range of motion,Decreased mobility,Decreased strength,Difficulty walking,Pain,Improper body mechanics,Impaired sensation,Increased muscle spasms,Hypomobility  Visit Diagnosis: Chronic left-sided low back pain with left-sided sciatica  Muscle weakness (generalized)  Other disturbances of skin sensation     Problem List Patient Active Problem List   Diagnosis Date Noted  . Need for prophylactic vaccination and inoculation against influenza 06/17/2016  . Polycythemia rubra vera ([facility]) 09/02/2011  . HZ (herpes zoster) 09/02/2011    Drake Leach, PT, DPT  09/20/2020, 12:23 PM   [facility] [address] [place], [place], [address] Phone: [phone]   Fax:  [phone]  Name: GUNNISON CHAHAL MRN: [id number] Date of Birth: 1940/09/30

## 2020-09-20 NOTE — Patient Instructions (Signed)
Access Code: [email or web address] URL: [email or web address] Date: 09/20/2020 Prepared by: Sherlie Ban  Exercises Seated Hamstring Stretch - 1-2 x daily - 5 x weekly - 3 sets - 25-30 hold Supine Figure 4 Piriformis Stretch - 1-2 x daily - 5 x weekly - 3 sets - 30 hold Seated Ankle Pumps - 1-2 x daily - 5 x weekly - 3 sets - 2 reps Lower Trunk Rotations - 1-2 x daily - 5 x weekly - 2 sets - 10 reps Prone Press Up on Elbows - 1 x daily - 5 x weekly - 1-2 sets - 10 reps

## 2020-09-25 ENCOUNTER — Other Ambulatory Visit: Payer: Self-pay

## 2020-09-25 ENCOUNTER — Encounter: Payer: Self-pay | Admitting: Physical Therapy

## 2020-09-25 ENCOUNTER — Ambulatory Visit: Payer: Medicare Other | Attending: Diagnostic Neuroimaging | Admitting: Physical Therapy

## 2020-09-25 DIAGNOSIS — R208 Other disturbances of skin sensation: Secondary | ICD-10-CM | POA: Insufficient documentation

## 2020-09-25 DIAGNOSIS — M5442 Lumbago with sciatica, left side: Secondary | ICD-10-CM | POA: Diagnosis not present

## 2020-09-25 DIAGNOSIS — M6281 Muscle weakness (generalized): Secondary | ICD-10-CM | POA: Diagnosis not present

## 2020-09-25 DIAGNOSIS — G8929 Other chronic pain: Secondary | ICD-10-CM | POA: Insufficient documentation

## 2020-09-25 NOTE — Therapy (Signed)
[facility] [address] [place], [place], [address] Phone: [phone]   Fax:  [phone]  Physical Therapy Treatment  Patient Details  Name: Jesse [name] MRN: [id number] Date of Birth: Jan 05, 1941 Referring Provider (PT): Joycelyn Schmid, MD   Encounter Date: 09/25/2020   PT End of Session - 09/25/20 0933    Visit Number 3    Number of Visits 9    Date for PT Re-Evaluation 11/18/20   written for 4 week POC   Authorization Type UHC Medicare    PT Start Time 0850    PT Stop Time 0929    PT Time Calculation (min) 39 min    Equipment Utilized During Treatment Gait belt    Activity Tolerance Patient tolerated treatment well    Behavior During Therapy [facility] for tasks assessed/performed           Past Medical History:  Diagnosis Date  . Depression   . Hypercholesteremia   . Hypertension   . HZ (herpes zoster) 09/02/2011  . Neuritis    left foot  . Polycythemia rubra vera ([facility]) 09/02/2011    Past Surgical History:  Procedure Laterality Date  . GAS INSERTION  06/04/2012   Procedure: INSERTION OF GAS;  Surgeon: Sherrie George, MD;  Location: [facility] OR;  Service: Ophthalmology;  Laterality: Left;  C3F8  . SCLERAL BUCKLE  06/04/2012   Procedure: SCLERAL BUCKLE;  Surgeon: Sherrie George, MD;  Location: [facility] OR;  Service: Ophthalmology;  Laterality: Left;  Headscope laser    There were no vitals filed for this visit.   Subjective Assessment - 09/25/20 0852    Subjective Did the exercises, hasn't really noticed too much of a change.    Pertinent History PMH: HTN    Limitations Walking    Diagnostic tests MRI from 10/24: 2.    At L4-L5, degenerative changes cause moderately severe left foraminal narrowing and severe left lateral recess stenosis with potential for left L4 and left L5 nerve root compression.  3.    At L5-S1, degenerative changes cause moderately severe right lateral recess stenosis with potential for right  S1 nerve root compression.    Patient Stated Goals wants to get rid of the pain.    Currently in Pain? No/denies                          Therapeutic Exercise:    [facility] Adult PT Treatment/Exercise - 09/25/20 0856      Exercises   Exercises Knee/Hip;Lumbar      Lumbar Exercises: Aerobic   Other Aerobic Exercise SciFit level 2.0 for 5 minutes with BLE and BUE for aerobic warm up, strengthening, ROM            Access Code: [email or web address] URL: [email or web address] Date: 09/20/2020 Prepared by: Sherlie Ban  Reviewed pt's HEP as given from last visit:   Exercises Seated Hamstring Stretch - 1-2 x daily - 5 x weekly - 3 sets - 25-30 hold - needing verbal and demo cues for technique, as pt initially performing with knee bent  Supine Figure 4 Piriformis Stretch - 1-2 x daily - 5 x weekly - 3 sets - 30 hold - pt briefly demonstrated how he performs at home with correct technique Seated Ankle Pumps - 1-2 x daily - 5 x weekly - 3 sets - 2 reps - modified slump nerve glide, with pt seated with slumped posture, L leg extended and performing ankle  pumps, discussed having pt's leg extended as pt initially performing with knee bent  Lower Trunk Rotations - 1-2 x daily - 5 x weekly - 2 sets - 10 reps - cues for nice and slow and holding at end range for additional stretch.  Prone Press Up on Elbows - 1 x daily - 5 x weekly - 1-2 sets - 10 reps - verbal and demo cues for technique, cues to perform slowly and to keep belly button in contact with the mat.    -Attempted seated Sciatic nerve glide for LLE with knee flexion/slumped posture and erect posture and knee extension, gave pt visual, verbal, and demo cues however pt unable to perform correctly with multiple attempts  -Supine hamstring stretch on LLE with gentle ankle DF/PF for sciatic nerve glide, 2 x 10 reps   -at countertop: modified PWR rock - shifting weight back for hamstring stretch and weight forwards and  bringing belly button towards counter, needing multi-modal cues for technique x10 reps Modified PWR Twist x5 reps B at countertop, pt more stiff when performing trunk rotation on L         PT Education - 09/25/20 0933    Education Details reviewed pt's HEP    Person(s) Educated Patient    Methods Explanation;Demonstration;Verbal cues    Comprehension Verbalized understanding;Returned demonstration            PT Short Term Goals - 09/19/20 1433      PT SHORT TERM GOAL #1   Title ALL STGS = LTGS             PT Long Term Goals - 09/20/20 1219      PT LONG TERM GOAL #1   Title Pt will be independent with final HEP in order to build upon functional gains made in therapy. ALL LTGS DUE 10/17/20    Time 4    Period Weeks    Status New      PT LONG TERM GOAL #2   Title Pt will decr ODI score to 14% or less in order to demo decr disability related to back pain.    Baseline 20% on 09/20/20    Time 4    Period Weeks    Status Revised      PT LONG TERM GOAL #3   Title Pt will perform 8 steps with single handrail mod I with reports of no low back pain in order to safely enter/exit home.    Time 4    Period Weeks    Status New      PT LONG TERM GOAL #4   Title Pt will report low back pain at its worst to a 3/10 or less in order to return to golf.    Baseline 6-7/10 pain at its worst.    Time 4    Period Weeks    Status New      PT LONG TERM GOAL #5   Title Pt will ambulate at least 1,000' over outdoor unlevel surfaces with mod I and low back pain of 2/10 or less in order to improve functional mobility without needing to stop and take rest breaks.    Time 4    Period Weeks    Status New                 Plan - 09/25/20 0934    Clinical Impression Statement Reviewed pt's HEP with pt needing verbal and demo cues for proper technique, especially to slow down some  exercises for an additional stretch. Attempted seated sciatic slump nerve glides, however pt with incr  difficulty performing despite verbal, demo, and visual cueing. Will continue to progress towards LTGs.    Personal Factors and Comorbidities Comorbidity 2;Past/Current Experience;Time since onset of injury/illness/exacerbation    Comorbidities PMH: HTN, Polycythemia rubra vera    Examination-Activity Limitations Locomotion Level;Stairs    Examination-Participation Restrictions Community Activity   playing golf   Stability/Clinical Decision Making Stable/Uncomplicated    Rehab Potential Good    PT Frequency 2x / week    PT Duration 4 weeks    PT Treatment/Interventions ADLs/Self Care Home Management;Gait training;Stair training;Functional mobility training;Therapeutic activities;Therapeutic exercise;Balance training;Patient/family education;Neuromuscular re-education;Manual techniques;Passive range of motion    PT Next Visit Plan review body mechanics for lifting!! review prone press ups for trunk extension. sciatic nerve glides. hamstring and piriformis stretching, glute/ABD strength, core stabilization. warm up on NuStep/SciFit.    Consulted and Agree with Plan of Care Patient           Patient will benefit from skilled therapeutic intervention in order to improve the following deficits and impairments:  Decreased activity tolerance,Decreased range of motion,Decreased mobility,Decreased strength,Difficulty walking,Pain,Improper body mechanics,Impaired sensation,Increased muscle spasms,Hypomobility  Visit Diagnosis: Chronic left-sided low back pain with left-sided sciatica  Muscle weakness (generalized)  Other disturbances of skin sensation     Problem List Patient Active Problem List   Diagnosis Date Noted  . Need for prophylactic vaccination and inoculation against influenza 06/17/2016  . Polycythemia rubra vera ([place]) 09/02/2011  . HZ (herpes zoster) 09/02/2011    Arliss Journey, PT, DPT  09/25/2020, 12:20 PM  [facility] [address] [place], [place], [address] Phone: [phone]   Fax:  [phone]  Name: Jesse [name] MRN: [id number] Date of Birth: 21-Sep-1941

## 2020-09-27 ENCOUNTER — Ambulatory Visit: Payer: Medicare Other | Admitting: Physical Therapy

## 2020-09-27 ENCOUNTER — Other Ambulatory Visit: Payer: Self-pay

## 2020-09-27 ENCOUNTER — Encounter: Payer: Self-pay | Admitting: Physical Therapy

## 2020-09-27 DIAGNOSIS — M6281 Muscle weakness (generalized): Secondary | ICD-10-CM | POA: Diagnosis not present

## 2020-09-27 DIAGNOSIS — R208 Other disturbances of skin sensation: Secondary | ICD-10-CM

## 2020-09-27 DIAGNOSIS — G8929 Other chronic pain: Secondary | ICD-10-CM

## 2020-09-27 DIAGNOSIS — M5442 Lumbago with sciatica, left side: Secondary | ICD-10-CM | POA: Diagnosis not present

## 2020-09-27 NOTE — Therapy (Signed)
[facility] [address] [place], [place], [address] Phone: [phone]   Fax:  [phone]  Physical Therapy Treatment  Patient Details  Name: Jesse [name] MRN: [id number] Date of Birth: [birth year] Referring Provider (PT): Andrey Spearman, MD   Encounter Date: 09/27/2020   PT End of Session - 09/27/20 1317    Visit Number 4    Number of Visits 9    Date for PT Re-Evaluation 11/18/20   written for 4 week POC   Authorization Type UHC Medicare    PT Start Time 1233    PT Stop Time 1313    PT Time Calculation (min) 40 min    Equipment Utilized During Treatment Gait belt    Activity Tolerance Patient tolerated treatment well    Behavior During Therapy [facility] for tasks assessed/performed           Past Medical History:  Diagnosis Date  . Depression   . Hypercholesteremia   . Hypertension   . HZ (herpes zoster) 09/02/2011  . Neuritis    left foot  . Polycythemia rubra vera ([place]) 09/02/2011    Past Surgical History:  Procedure Laterality Date  . GAS INSERTION  06/04/2012   Procedure: INSERTION OF GAS;  Surgeon: Hayden Pedro, MD;  Location: [facility];  Service: Ophthalmology;  Laterality: Left;  C3F8  . SCLERAL BUCKLE  06/04/2012   Procedure: SCLERAL BUCKLE;  Surgeon: Hayden Pedro, MD;  Location: [facility];  Service: Ophthalmology;  Laterality: Left;  Headscope laser    There were no vitals filed for this visit.   Subjective Assessment - 09/27/20 1234    Subjective Feeling a little bit better today.    Pertinent History PMH: HTN    Limitations Walking    Diagnostic tests MRI from 10/24: 2.    At L4-L5, degenerative changes cause moderately severe left foraminal narrowing and severe left lateral recess stenosis with potential for left L4 and left L5 nerve root compression.  3.    At L5-S1, degenerative changes cause moderately severe right lateral recess stenosis with potential for right S1 nerve root compression.     Patient Stated Goals wants to get rid of the pain.    Currently in Pain? No/denies                             [facility] Adult PT Treatment/Exercise - 09/27/20 1249      Lumbar Exercises: Stretches   Passive Hamstring Stretch 4 reps;30 seconds;Left    Passive Hamstring Stretch Limitations pt supine in mat table, with addition of sciatic nerve glide with ankle PF/DF x10 reps    Prone on Elbows Stretch Limitations    Prone on Elbows Stretch Limitations 2 x 10 reps with cues to hold at end range for additional stretch, discussed importance of performing at home as pt reports he has not been performing very often    Press Ups 5 reps    Press Ups Limitations 2 sets of 5 reps with pillow underneath hips, pt reporting relief after performing, but reports incr difficulty due to BUE weakness when pressing up    Quadruped Mid Back Stretch Limitations    Quadruped Mid Back Stretch Limitations x10 reps, performing cat/cow, pt needing verbal/manual/demo and visual cueing for proper technique, cues to slow down as pt still with tendency to perform too quickly      Lumbar Exercises: Seated   Other Seated Lumbar  Exercises with LLE in knee extension performing ankle PF/DF 2 x 10 reps for sciatic nerve glide, therapist assisting maintain LLE in position      Knee/Hip Exercises: Stretches   Gastroc Stretch Left;2 reps;30 seconds    Gastroc Stretch Limitations standing at countertop, demo cues for proper technique      Manual Therapy   Manual Therapy Joint mobilization    Joint Mobilization grade II/III CPA mobilizations to L2-L5, pt with incr hypomobility at L3/L4 and L4/5. performed grade II unilateral mobilizations to L3-5 for pain relief                  PT Education - 09/27/20 1316    Education Details importance of performing HEP at home, especially prone press ups    Person(s) Educated Patient    Methods Explanation    Comprehension Verbalized understanding             PT Short Term Goals - 09/19/20 1433      PT SHORT TERM GOAL #1   Title ALL STGS = LTGS             PT Long Term Goals - 09/20/20 1219      PT LONG TERM GOAL #1   Title Pt will be independent with final HEP in order to build upon functional gains made in therapy. ALL LTGS DUE 10/17/20    Time 4    Period Weeks    Status New      PT LONG TERM GOAL #2   Title Pt will decr ODI score to 14% or less in order to demo decr disability related to back pain.    Baseline 20% on 09/20/20    Time 4    Period Weeks    Status Revised      PT LONG TERM GOAL #3   Title Pt will perform 8 steps with single handrail mod I with reports of no low back pain in order to safely enter/exit home.    Time 4    Period Weeks    Status New      PT LONG TERM GOAL #4   Title Pt will report low back pain at its worst to a 3/10 or less in order to return to golf.    Baseline 6-7/10 pain at its worst.    Time 4    Period Weeks    Status New      PT LONG TERM GOAL #5   Title Pt will ambulate at least 1,000' over outdoor unlevel surfaces with mod I and low back pain of 2/10 or less in order to improve functional mobility without needing to stop and take rest breaks.    Time 4    Period Weeks    Status New                 Plan - 09/27/20 1317    Clinical Impression Statement Pt continues to need multi-modal cues for exercises and stretches, esp cat/cow for back mobility, needing cues to slow down and pt having difficulty with coordinating movements. After prone press ups, manual therapy (joint mobilizations to low back to incr extension), and nerve glides pt reporting a decr in low back pain and no radicular sx in L leg. Will continue to progress towards LTGs.    Personal Factors and Comorbidities Comorbidity 2;Past/Current Experience;Time since onset of injury/illness/exacerbation    Comorbidities PMH: HTN, Polycythemia rubra vera    Examination-Activity Limitations Locomotion Level;Stairs  Examination-Participation Restrictions Community Activity   playing golf   Stability/Clinical Decision Making Stable/Uncomplicated    Rehab Potential Good    PT Frequency 2x / week    PT Duration 4 weeks    PT Treatment/Interventions ADLs/Self Care Home Management;Gait training;Stair training;Functional mobility training;Therapeutic activities;Therapeutic exercise;Balance training;Patient/family education;Neuromuscular re-education;Manual techniques;Passive range of motion    PT Next Visit Plan review body mechanics for lifting!! prone press ups for trunk extension. sciatic nerve glides (supine). hamstring and piriformis stretching, glute/ABD strength, core stabilization. warm up on NuStep/SciFit.    Consulted and Agree with Plan of Care Patient           Patient will benefit from skilled therapeutic intervention in order to improve the following deficits and impairments:  Decreased activity tolerance,Decreased range of motion,Decreased mobility,Decreased strength,Difficulty walking,Pain,Improper body mechanics,Impaired sensation,Increased muscle spasms,Hypomobility  Visit Diagnosis: Chronic left-sided low back pain with left-sided sciatica  Muscle weakness (generalized)  Other disturbances of skin sensation     Problem List Patient Active Problem List   Diagnosis Date Noted  . Need for prophylactic vaccination and inoculation against influenza 06/17/2016  . Polycythemia rubra vera ([place]) 09/02/2011  . HZ (herpes zoster) 09/02/2011    Arliss Journey, PT, DPT  09/27/2020, 1:26 PM  [facility] [address] [place], [place], [address] Phone: [phone]   Fax:  [phone]  Name: Jesse [name] MRN: [id number] Date of Birth: 15-Dec-1940

## 2020-09-28 DIAGNOSIS — E538 Deficiency of other specified B group vitamins: Secondary | ICD-10-CM | POA: Diagnosis not present

## 2020-09-29 ENCOUNTER — Inpatient Hospital Stay: Payer: Medicare Other | Attending: Family

## 2020-09-29 ENCOUNTER — Inpatient Hospital Stay: Payer: Medicare Other

## 2020-09-29 ENCOUNTER — Encounter: Payer: Self-pay | Admitting: Family

## 2020-09-29 ENCOUNTER — Other Ambulatory Visit: Payer: Self-pay

## 2020-09-29 ENCOUNTER — Inpatient Hospital Stay: Payer: Medicare Other | Admitting: Family

## 2020-09-29 ENCOUNTER — Telehealth: Payer: Self-pay | Admitting: Family

## 2020-09-29 VITALS — BP 128/83 | HR 75 | Temp 98.3°F | Resp 17 | Ht 66.0 in | Wt 155.4 lb

## 2020-09-29 DIAGNOSIS — D500 Iron deficiency anemia secondary to blood loss (chronic): Secondary | ICD-10-CM

## 2020-09-29 DIAGNOSIS — D751 Secondary polycythemia: Secondary | ICD-10-CM | POA: Diagnosis not present

## 2020-09-29 DIAGNOSIS — D45 Polycythemia vera: Secondary | ICD-10-CM

## 2020-09-29 DIAGNOSIS — Z7982 Long term (current) use of aspirin: Secondary | ICD-10-CM | POA: Insufficient documentation

## 2020-09-29 LAB — CBC WITH DIFFERENTIAL (CANCER CENTER ONLY)
Abs Immature Granulocytes: 0.04 10*3/uL (ref 0.00–0.07)
Basophils Absolute: 0 10*3/uL (ref 0.0–0.1)
Basophils Relative: 1 %
Eosinophils Absolute: 0.1 10*3/uL (ref 0.0–0.5)
Eosinophils Relative: 1 %
HCT: 47.8 % (ref 39.0–52.0)
Hemoglobin: 15 g/dL (ref 13.0–17.0)
Immature Granulocytes: 1 %
Lymphocytes Relative: 31 %
Lymphs Abs: 2.3 10*3/uL (ref 0.7–4.0)
MCH: 26.7 pg (ref 26.0–34.0)
MCHC: 31.4 g/dL (ref 30.0–36.0)
MCV: 85.2 fL (ref 80.0–100.0)
Monocytes Absolute: 1.1 10*3/uL — ABNORMAL HIGH (ref 0.1–1.0)
Monocytes Relative: 15 %
Neutro Abs: 3.8 10*3/uL (ref 1.7–7.7)
Neutrophils Relative %: 51 %
Platelet Count: 310 10*3/uL (ref 150–400)
RBC: 5.61 MIL/uL (ref 4.22–5.81)
RDW: 17.1 % — ABNORMAL HIGH (ref 11.5–15.5)
WBC Count: 7.4 10*3/uL (ref 4.0–10.5)
nRBC: 0 % (ref 0.0–0.2)

## 2020-09-29 LAB — CMP (CANCER CENTER ONLY)
ALT: 16 U/L (ref 0–44)
AST: 14 U/L — ABNORMAL LOW (ref 15–41)
Albumin: 4 g/dL (ref 3.5–5.0)
Alkaline Phosphatase: 51 U/L (ref 38–126)
Anion gap: 6 (ref 5–15)
BUN: 13 mg/dL (ref 8–23)
CO2: 30 mmol/L (ref 22–32)
Calcium: 10.2 mg/dL (ref 8.9–10.3)
Chloride: 98 mmol/L (ref 98–111)
Creatinine: 0.82 mg/dL (ref 0.61–1.24)
GFR, Estimated: >60 mL/min (ref >60)
Glucose, Bld: 80 mg/dL (ref 70–99)
Potassium: 3.7 mmol/L (ref 3.5–5.1)
Sodium: 134 mmol/L — ABNORMAL LOW (ref 135–145)
Total Bilirubin: 0.9 mg/dL (ref 0.3–1.2)
Total Protein: 6.6 g/dL (ref 6.5–8.1)

## 2020-09-29 LAB — FERRITIN: Ferritin: 15 ng/mL — ABNORMAL LOW (ref 24–336)

## 2020-09-29 LAB — IRON AND TIBC
Iron: 55 ug/dL (ref 42–163)
Saturation Ratios: 15 % — ABNORMAL LOW (ref 20–55)
TIBC: 378 ug/dL (ref 202–409)
UIBC: 322 ug/dL (ref 117–376)

## 2020-09-29 NOTE — Telephone Encounter (Signed)
Appointments scheduled and patient will get AVS after appt today per 1/7 los

## 2020-09-29 NOTE — Progress Notes (Signed)
1 unit phlebotomy performed over 9 minutes using a 16 gauge phlebotomy set to the left AC. Patient tolerated well. Nourishment provided.   Patient does not want to stay for the 30 minute post phlebotomy observation. VSS. Patient discharged ambulatory without complaints or concerns.

## 2020-09-29 NOTE — Progress Notes (Signed)
Hematology and Oncology Follow Up Visit  Jesse [name] [id number] 1940/10/24 80 y.o. 09/29/2020   Principle Diagnosis:  Polycythemia vera - JAK2 negative  Current Therapy: Phlebotomy to maintain hematocrit below 45% Aspirin 81 mg by mouth daily              Interim History:  Jesse [name] is here today for follow-up and phlebotomy. Hct is 47.8%.  He is in PT two days a week for strengthening his legs. He has a lot of arthritis which bothers him and effects his mobility.  He also takes Neurontin for pain which he states does help.  No swelling in his extremities.  No falls or syncope to report.  No fever, chills, n/v, cough, rash, dizziness, SOB, chest pain, palpitations, abdominal pain or changes in bowel or bladder habits.  He has maintained a good appetite and is staying well hydrated. His weight is stable at 155 lbs.   ECOG Performance Status: 1 - Symptomatic but completely ambulatory  Medications:  Allergies as of 09/29/2020      Reactions   Tramadol Nausea And Vomiting      Medication List       Accurate as of September 29, 2020  9:37 AM. If you have any questions, ask your nurse or doctor.        acetaminophen 325 MG tablet Commonly known as: TYLENOL Take 325 mg by mouth every 6 (six) hours as needed for moderate pain.   ALPRAZolam 1 MG tablet Commonly known as: XANAX Take 0.5 mg by mouth at bedtime.   aspirin EC 81 MG tablet Take 81 mg by mouth daily.   fish oil-omega-3 fatty acids 1000 MG capsule Take 1 g by mouth daily.   gabapentin 100 MG capsule Commonly known as: NEURONTIN Take 100 mg by mouth 3 (three) times daily.   lisinopril-hydrochlorothiazide 20-12.5 MG tablet Commonly known as: ZESTORETIC Take 1 tablet by mouth daily.   ondansetron 4 MG disintegrating tablet Commonly known as: Zofran ODT Take 1 tablet (4 mg total) by mouth every 8 (eight) hours as needed for up to 15 doses for nausea or vomiting.   simvastatin 20 MG tablet Commonly  known as: ZOCOR Take 20 mg by mouth daily at 6 PM.   traMADol 50 MG tablet Commonly known as: ULTRAM Take 50 mg by mouth 2 (two) times daily as needed.   vitamin E 1000 UNIT capsule Take 1,000 Units by mouth daily.       Allergies:  Allergies  Allergen Reactions  . Tramadol Nausea And Vomiting    Past Medical History, Surgical history, Social history, and Family History were reviewed and updated.  Review of Systems: All other 10 point review of systems is negative.   Physical Exam:  height is 5\' 6"  (1.676 m) and weight is 155 lb 6.4 oz (70.5 kg). His oral temperature is 98.3 F (36.8 C). His blood pressure is 128/83 and his pulse is 75. His respiration is 17 and oxygen saturation is 100%.   Wt Readings from Last 3 Encounters:  09/29/20 155 lb 6.4 oz (70.5 kg)  07/19/20 155 lb 1.9 oz (70.4 kg)  07/07/20 154 lb 12.8 oz (70.2 kg)    Ocular: Sclerae unicteric, pupils equal, round and reactive to light Ear-nose-throat: Oropharynx clear, dentition fair Lymphatic: No cervical or supraclavicular adenopathy Lungs no rales or rhonchi, good excursion bilaterally Heart regular rate and rhythm, no murmur appreciated Abd soft, nontender, positive bowel sounds MSK no focal spinal tenderness, no joint edema  Neuro: non-focal, well-oriented, appropriate affect Breasts: Deferred   Lab Results  Component Value Date   WBC 7.4 09/29/2020   HGB 15.0 09/29/2020   HCT 47.8 09/29/2020   MCV 85.2 09/29/2020   PLT 310 09/29/2020   Lab Results  Component Value Date   FERRITIN 17 (L) 07/19/2020   IRON 47 07/19/2020   TIBC 414 (H) 07/19/2020   UIBC 367 07/19/2020   IRONPCTSAT 11 (L) 07/19/2020   Lab Results  Component Value Date   RETICCTPCT 1.5 01/11/2020   RBC 5.61 09/29/2020   RETICCTABS 87.5 01/02/2011   No results found for: KPAFRELGTCHN, LAMBDASER, KAPLAMBRATIO No results found for: Kandis Cocking, IGMSERUM No results found for: Kathrynn Ducking, MSPIKE, SPEI   Chemistry      Component Value Date/Time   NA 131 (L) 07/19/2020 1113   NA 136 08/19/2017 1149   NA 135 (L) 10/21/2016 1151   K 3.7 07/19/2020 1113   K 3.8 08/19/2017 1149   K 3.9 10/21/2016 1151   CL 94 (L) 07/19/2020 1113   CL 95 (L) 08/19/2017 1149   CO2 29 07/19/2020 1113   CO2 28 08/19/2017 1149   CO2 25 10/21/2016 1151   BUN 15 07/19/2020 1113   BUN 14 08/19/2017 1149   BUN 13.6 10/21/2016 1151   CREATININE 0.91 07/19/2020 1113   CREATININE 1.0 08/19/2017 1149   CREATININE 0.8 10/21/2016 1151      Component Value Date/Time   CALCIUM 10.4 (H) 07/19/2020 1113   CALCIUM 10.1 08/19/2017 1149   CALCIUM 10.3 10/21/2016 1151   ALKPHOS 54 07/19/2020 1113   ALKPHOS 68 08/19/2017 1149   ALKPHOS 68 10/21/2016 1151   AST 14 (L) 07/19/2020 1113   AST 25 10/21/2016 1151   ALT 14 07/19/2020 1113   ALT 32 08/19/2017 1149   ALT 38 10/21/2016 1151   BILITOT 1.0 07/19/2020 1113   BILITOT 1.22 (H) 10/21/2016 1151       Impression and Plan: Jesse [name] is a very pleasant 80yo caucasian gentleman with polycythemia, JAK-2 negative. He did have a phlebotomy today for Hct 47.8%.  Follow-up in 8 weeks.  He can contact our office with any questions or concerns.   Laverna Peace, NP 1/7/20229:37 AM

## 2020-09-29 NOTE — Patient Instructions (Signed)
Therapeutic Phlebotomy Therapeutic phlebotomy is the planned removal of blood from a person's body for the purpose of treating a medical condition. The procedure is similar to donating blood. Usually, about a pint (470 mL, or 0.47 L) of blood is removed. The average adult has 9-12 pints (4.3-5.7 L) of blood in the body. Therapeutic phlebotomy may be used to treat the following medical conditions:  Hemochromatosis. This is a condition in which the blood contains too much iron.  Polycythemia vera. This is a condition in which the blood contains too many red blood cells.  Porphyria cutanea tarda. This is a disease in which an important part of hemoglobin is not made properly. It results in the buildup of abnormal amounts of porphyrins in the body.  Sickle cell disease. This is a condition in which the red blood cells form an abnormal crescent shape rather than a round shape. Tell a health care provider about:  Any allergies you have.  All medicines you are taking, including vitamins, herbs, eye drops, creams, and over-the-counter medicines.  Any problems you or family members have had with anesthetic medicines.  Any blood disorders you have.  Any surgeries you have had.  Any medical conditions you have.  Whether you are pregnant or may be pregnant. What are the risks? Generally, this is a safe procedure. However, problems may occur, including:  Nausea or light-headedness.  Low blood pressure (hypotension).  Soreness, bleeding, swelling, or bruising at the needle insertion site.  Infection. What happens before the procedure?  Follow instructions from your health care provider about eating or drinking restrictions.  Ask your health care provider about: ? Changing or stopping your regular medicines. This is especially important if you are taking diabetes medicines or blood thinners (anticoagulants). ? Taking medicines such as aspirin and ibuprofen. These medicines can thin your  blood. Do not take these medicines unless your health care provider tells you to take them. ? Taking over-the-counter medicines, vitamins, herbs, and supplements.  Wear clothing with sleeves that can be raised above the elbow.  Plan to have someone take you home from the hospital or clinic.  You may have a blood sample taken.  Your blood pressure, pulse rate, and breathing rate will be measured. What happens during the procedure?   To lower your risk of infection: ? Your health care team will wash or sanitize their hands. ? Your skin will be cleaned with an antiseptic.  You may be given a medicine to numb the area (local anesthetic).  A tourniquet will be placed on your arm.  A needle will be inserted into one of your veins.  Tubing and a collection bag will be attached to that needle.  Blood will flow through the needle and tubing into the collection bag.  The collection bag will be placed lower than your arm to allow gravity to help the flow of blood into the bag.  You may be asked to open and close your hand slowly and continually during the entire collection.  After the specified amount of blood has been removed from your body, the collection bag and tubing will be clamped.  The needle will be removed from your vein.  Pressure will be held on the site of the needle insertion to stop the bleeding.  A bandage (dressing) will be placed over the needle insertion site. The procedure may vary among health care providers and hospitals. What happens after the procedure?  Your blood pressure, pulse rate, and breathing rate will be   measured after the procedure.  You will be encouraged to drink fluids.  Your recovery will be assessed and monitored.  You can return to your normal activities as told by your health care provider. Summary  Therapeutic phlebotomy is the planned removal of blood from a person's body for the purpose of treating a medical condition.  Therapeutic  phlebotomy may be used to treat hemochromatosis, polycythemia vera, porphyria cutanea tarda, or sickle cell disease.  In the procedure, a needle is inserted and about a pint (470 mL, or 0.47 L) of blood is removed. The average adult has 9-12 pints (4.3-5.7 L) of blood in the body.  This is generally a safe procedure, but it can sometimes cause problems such as nausea, light-headedness, or low blood pressure (hypotension). This information is not intended to replace advice given to you by your health care provider. Make sure you discuss any questions you have with your health care provider. Document Revised: 09/25/2017 Document Reviewed: 09/25/2017 Elsevier Patient Education  2020 [employer]  

## 2020-10-02 ENCOUNTER — Encounter: Payer: Self-pay | Admitting: Physical Therapy

## 2020-10-02 ENCOUNTER — Ambulatory Visit: Payer: Medicare Other | Admitting: Physical Therapy

## 2020-10-02 ENCOUNTER — Other Ambulatory Visit: Payer: Self-pay

## 2020-10-02 DIAGNOSIS — G8929 Other chronic pain: Secondary | ICD-10-CM

## 2020-10-02 DIAGNOSIS — M6281 Muscle weakness (generalized): Secondary | ICD-10-CM

## 2020-10-02 DIAGNOSIS — M5442 Lumbago with sciatica, left side: Secondary | ICD-10-CM | POA: Diagnosis not present

## 2020-10-02 DIAGNOSIS — R208 Other disturbances of skin sensation: Secondary | ICD-10-CM

## 2020-10-02 NOTE — Patient Instructions (Signed)
Access Code: [email or web address] URL: [email or web address] Date: 10/02/2020 Prepared by: Janann August  Exercises Seated Hamstring Stretch - 1-2 x daily - 5 x weekly - 3 sets - 25-30 hold Supine Figure 4 Piriformis Stretch - 1-2 x daily - 5 x weekly - 3 sets - 30 hold Lower Trunk Rotations - 1-2 x daily - 5 x weekly - 2 sets - 10 reps Prone Press Up on Elbows - 1 x daily - 5 x weekly - 1-2 sets - 10 reps Seated Sciatic Tensioner - 2 x daily - 5 x weekly - 2 sets - 10 reps Standing Lumbar Spine Flexion Stretch Counter - 1 x daily - 5 x weekly - 2 sets - 10 reps

## 2020-10-02 NOTE — Therapy (Signed)
[facility] [address] [place], [place], [address] Phone: [phone]   Fax:  [phone]  Physical Therapy Treatment  Patient Details  Name: Jesse [name] MRN: [id number] Date of Birth: [birth year] Referring Provider (PT): Andrey Spearman, MD   Encounter Date: 10/02/2020   PT End of Session - 10/02/20 1156    Visit Number 5    Number of Visits 9    Date for PT Re-Evaluation 11/18/20   written for 4 week POC   Authorization Type UHC Medicare    PT Start Time 0935    PT Stop Time 1016    PT Time Calculation (min) 41 min    Equipment Utilized During Treatment Gait belt    Activity Tolerance Patient tolerated treatment well    Behavior During Therapy [facility] for tasks assessed/performed           Past Medical History:  Diagnosis Date  . Depression   . Hypercholesteremia   . Hypertension   . HZ (herpes zoster) 09/02/2011  . Neuritis    left foot  . Polycythemia rubra vera ([place]) 09/02/2011    Past Surgical History:  Procedure Laterality Date  . GAS INSERTION  06/04/2012   Procedure: INSERTION OF GAS;  Surgeon: Hayden Pedro, MD;  Location: [place];  Service: Ophthalmology;  Laterality: Left;  C3F8  . SCLERAL BUCKLE  06/04/2012   Procedure: SCLERAL BUCKLE;  Surgeon: Hayden Pedro, MD;  Location: [place];  Service: Ophthalmology;  Laterality: Left;  Headscope laser    There were no vitals filed for this visit.   Subjective Assessment - 10/02/20 0937    Subjective Back is feeling a little bit better. Has been trying to do the exercises more.    Pertinent History PMH: HTN    Limitations Walking    Diagnostic tests MRI from 10/24: 2.    At L4-L5, degenerative changes cause moderately severe left foraminal narrowing and severe left lateral recess stenosis with potential for left L4 and left L5 nerve root compression.  3.    At L5-S1, degenerative changes cause moderately severe right lateral recess stenosis with  potential for right S1 nerve root compression.    Patient Stated Goals wants to get rid of the pain.    Currently in Pain? No/denies   "just the feeling in his calf and low back"                            [facility] Adult PT Treatment/Exercise - 10/02/20 0940      Lumbar Exercises: Stretches   Passive Hamstring Stretch 4 reps;30 seconds;Left    Passive Hamstring Stretch Limitations pt supine on mat table, performed an additional 2 x 10 reps of stretch with gentle ankle PF/DF for sciatic nerve glide      Lumbar Exercises: Aerobic   Other Aerobic Exercise SciFit level 2.0 for 5 minutes with BLE and BUE for aerobic warm up, strengthening, ROM      Lumbar Exercises: Supine   Bridge 10 reps;Other (comment)    Bridge Limitations 2 x 10 reps, verbal cues for proper technique      Lumbar Exercises: Sidelying   Clam Limitations x10 reps each side, manual and verbal cues for technique with therapist also helping pt maintain hips in proper position            Access Code: [email or web address] URL: [email or web address] Date: 10/02/2020 Prepared by: Janann August  Exercises Seated  Hamstring Stretch - 1-2 x daily - 5 x weekly - 3 sets - 25-30 hold - reviewed proper technique as pt had not been performing at home  Supine Figure 4 Piriformis Stretch - 1-2 x daily - 5 x weekly - 3 sets - 30 hold Lower Trunk Rotations - 1-2 x daily - 5 x weekly - 2 sets - 10 reps Prone Press Up on Elbows - 1 x daily - 5 x weekly - 1-2 sets - 10 reps Seated Sciatic Tensioner - 2 x daily - 5 x weekly - 2 sets - 10 reps - new addition to HEP, seated with slump posture performing knee flexion/extension performed 3 x 10 reps today cues for slowed and controlled  Standing Lumbar Spine Flexion Stretch Counter - 1 x daily - 5 x weekly - 2 sets - 10 reps - wide BOS shifting hips back and then bringing bellybutton forwards towards counter for more dynamic stretch, needing verbal and demo cues for  technique, cues for slowed and controlled        PT Education - 10/02/20 1155    Education Details new additions to HEP (sciatic nerve glide, dynamic hamstring stretch), educated on continued importance of performing HEP at home as pt has not been consistently performing    Person(s) Educated Patient    Methods Explanation;Demonstration;Handout    Comprehension Verbalized understanding;Returned demonstration            PT Short Term Goals - 09/19/20 1433      PT SHORT TERM GOAL #1   Title ALL STGS = LTGS             PT Long Term Goals - 09/20/20 1219      PT LONG TERM GOAL #1   Title Pt will be independent with final HEP in order to build upon functional gains made in therapy. ALL LTGS DUE 10/17/20    Time 4    Period Weeks    Status New      PT LONG TERM GOAL #2   Title Pt will decr ODI score to 14% or less in order to demo decr disability related to back pain.    Baseline 20% on 09/20/20    Time 4    Period Weeks    Status Revised      PT LONG TERM GOAL #3   Title Pt will perform 8 steps with single handrail mod I with reports of no low back pain in order to safely enter/exit home.    Time 4    Period Weeks    Status New      PT LONG TERM GOAL #4   Title Pt will report low back pain at its worst to a 3/10 or less in order to return to golf.    Baseline 6-7/10 pain at its worst.    Time 4    Period Weeks    Status New      PT LONG TERM GOAL #5   Title Pt will ambulate at least 1,000' over outdoor unlevel surfaces with mod I and low back pain of 2/10 or less in order to improve functional mobility without needing to stop and take rest breaks.    Time 4    Period Weeks    Status New                 Plan - 10/02/20 1213    Clinical Impression Statement Added dynamic standing lumbar flexion/extension stretch at counter and seated  sciatic tensioner exercise to pt's HEP. After seated sciatic nerve tensioner, pt reporting no radicular pain in L calf and  instead it was just in pt's low back. Pt reporting feeling less stiff at end of session. Continued to discuss importance of performing HEP at homes to maintain gains made in therapy as pt reports not performing consistently. Will continue to progress towards LTGs.    Personal Factors and Comorbidities Comorbidity 2;Past/Current Experience;Time since onset of injury/illness/exacerbation    Comorbidities PMH: HTN, Polycythemia rubra vera    Examination-Activity Limitations Locomotion Level;Stairs    Examination-Participation Restrictions Community Activity   playing golf   Stability/Clinical Decision Making Stable/Uncomplicated    Rehab Potential Good    PT Frequency 2x / week    PT Duration 4 weeks    PT Treatment/Interventions ADLs/Self Care Home Management;Gait training;Stair training;Functional mobility training;Therapeutic activities;Therapeutic exercise;Balance training;Patient/family education;Neuromuscular re-education;Manual techniques;Passive range of motion    PT Next Visit Plan review body mechanics for lifting!! prone press ups for trunk extension. sciatic nerve glides (supine and seated). hamstring and piriformis stretching, glute/ABD strength, core stabilization. warm up on NuStep/SciFit.    Consulted and Agree with Plan of Care Patient           Patient will benefit from skilled therapeutic intervention in order to improve the following deficits and impairments:  Decreased activity tolerance,Decreased range of motion,Decreased mobility,Decreased strength,Difficulty walking,Pain,Improper body mechanics,Impaired sensation,Increased muscle spasms,Hypomobility  Visit Diagnosis: Chronic left-sided low back pain with left-sided sciatica  Muscle weakness (generalized)  Other disturbances of skin sensation     Problem List Patient Active Problem List   Diagnosis Date Noted  . Need for prophylactic vaccination and inoculation against influenza 06/17/2016  . Polycythemia rubra  vera ([place]) 09/02/2011  . HZ (herpes zoster) 09/02/2011    Arliss Journey, PT, DPT  10/02/2020, 12:15 PM  [facility] [address] [place], [place], [address] Phone: [phone]   Fax:  [phone]  Name: Jesse [name] MRN: [id number] Date of Birth: June 08, 1941

## 2020-10-04 ENCOUNTER — Other Ambulatory Visit: Payer: Self-pay

## 2020-10-04 ENCOUNTER — Encounter: Payer: Self-pay | Admitting: Physical Therapy

## 2020-10-04 ENCOUNTER — Ambulatory Visit: Payer: Medicare Other | Admitting: Physical Therapy

## 2020-10-04 DIAGNOSIS — G8929 Other chronic pain: Secondary | ICD-10-CM

## 2020-10-04 DIAGNOSIS — M5442 Lumbago with sciatica, left side: Secondary | ICD-10-CM | POA: Diagnosis not present

## 2020-10-04 DIAGNOSIS — R208 Other disturbances of skin sensation: Secondary | ICD-10-CM | POA: Diagnosis not present

## 2020-10-04 DIAGNOSIS — M6281 Muscle weakness (generalized): Secondary | ICD-10-CM

## 2020-10-04 NOTE — Therapy (Signed)
[facility] [address] [place], [place], [address] Phone: [phone]   Fax:  [phone]  Physical Therapy Treatment  Patient Details  Name: Jesse [name] MRN: [id number] Date of Birth: Aug 01, 1941 Referring Provider (PT): Andrey Spearman, MD   Encounter Date: 10/04/2020   PT End of Session - 10/04/20 1156    Visit Number 6    Number of Visits 9    Date for PT Re-Evaluation 11/18/20   written for 4 week POC   Authorization Type UHC Medicare    PT Start Time 1100    PT Stop Time 1142    PT Time Calculation (min) 42 min    Equipment Utilized During Treatment Gait belt    Activity Tolerance Patient tolerated treatment well    Behavior During Therapy [facility] for tasks assessed/performed           Past Medical History:  Diagnosis Date  . Depression   . Hypercholesteremia   . Hypertension   . HZ (herpes zoster) 09/02/2011  . Neuritis    left foot  . Polycythemia rubra vera ([place]) 09/02/2011    Past Surgical History:  Procedure Laterality Date  . GAS INSERTION  06/04/2012   Procedure: INSERTION OF GAS;  Surgeon: Hayden Pedro, MD;  Location: [place];  Service: Ophthalmology;  Laterality: Left;  C3F8  . SCLERAL BUCKLE  06/04/2012   Procedure: SCLERAL BUCKLE;  Surgeon: Hayden Pedro, MD;  Location: [place];  Service: Ophthalmology;  Laterality: Left;  Headscope laser    There were no vitals filed for this visit.   Subjective Assessment - 10/04/20 1102    Subjective Reports feeling a little bit better. Has been doing more of the exercises.    Pertinent History PMH: HTN    Limitations Walking    Diagnostic tests MRI from 10/24: 2.    At L4-L5, degenerative changes cause moderately severe left foraminal narrowing and severe left lateral recess stenosis with potential for left L4 and left L5 nerve root compression.  3.    At L5-S1, degenerative changes cause moderately severe right lateral recess stenosis with  potential for right S1 nerve root compression.    Patient Stated Goals wants to get rid of the pain.    Currently in Pain? No/denies   "no pain just tightness in the calf"                            [facility] Adult PT Treatment/Exercise - 10/04/20 0001      Therapeutic Activites    Therapeutic Activities Other Therapeutic Activities    Other Therapeutic Activities reviewed proper lifting technique for when pt taking groceries in and out of the house, cues for wider BOS and fully facing object to pick it up (vs pt with tendency to turn and grab object), and to use legs first, keep object close to body and to turn body as one vs. Twisting spine. Practiced multiple reps with 5# weight in crate      Lumbar Exercises: Stretches   Passive Hamstring Stretch 4 reps;30 seconds;Left    Passive Hamstring Stretch Limitations pt supine on mat table, performed an additional 2 x 10 reps of stretch with gentle ankle PF/DF for sciatic nerve glide    Piriformis Stretch Left;3 reps;30 seconds    Piriformis Stretch Limitations with LLE with gentle overpressure from therapist    Other Lumbar Stretch Exercise childs pose 5 x 20 second  reps, then performing childs pose with reaching towards R for incr L side body stretch 4 x 10 seconds - demo cues for technique      Lumbar Exercises: Standing   Other Standing Lumbar Exercises Standing at countertop with single UE support: x10 reps trunk rotation with reaching both directions, incr tightness with LUE      Knee/Hip Exercises: Stretches   Gastroc Stretch Both;3 reps;20 seconds    Gastroc Stretch Limitations standing at staircase and dropping heels down, verbal and demo cues for technique for stretch           Reviewed from recent HEP:    Seated Sciatic Tensioner - 2 x daily - 5 x weekly - 2 sets - 10 reps - seated with slump posture performing knee flexion/extension performed 2 x 10 reps today cues for slowed and controlled  Standing Lumbar  Spine Flexion Stretch Counter - 1 x daily - 5 x weekly - 2 sets - 10 reps - wide BOS shifting hips back and then bringing bellybutton forwards towards counter for more dynamic stretch, needing verbal and demo cues for technique, cues for slowed and controlled for motion through hips vs. BUE     PT Education - 10/04/20 1156    Education Details reviewed HEP, proper lifting technique    Person(s) Educated Patient    Methods Explanation;Demonstration;Verbal cues    Comprehension Verbalized understanding;Returned demonstration            PT Short Term Goals - 09/19/20 1433      PT SHORT TERM GOAL #1   Title ALL STGS = LTGS             PT Long Term Goals - 09/20/20 1219      PT LONG TERM GOAL #1   Title Pt will be independent with final HEP in order to build upon functional gains made in therapy. ALL LTGS DUE 10/17/20    Time 4    Period Weeks    Status New      PT LONG TERM GOAL #2   Title Pt will decr ODI score to 14% or less in order to demo decr disability related to back pain.    Baseline 20% on 09/20/20    Time 4    Period Weeks    Status Revised      PT LONG TERM GOAL #3   Title Pt will perform 8 steps with single handrail mod I with reports of no low back pain in order to safely enter/exit home.    Time 4    Period Weeks    Status New      PT LONG TERM GOAL #4   Title Pt will report low back pain at its worst to a 3/10 or less in order to return to golf.    Baseline 6-7/10 pain at its worst.    Time 4    Period Weeks    Status New      PT LONG TERM GOAL #5   Title Pt will ambulate at least 1,000' over outdoor unlevel surfaces with mod I and low back pain of 2/10 or less in order to improve functional mobility without needing to stop and take rest breaks.    Time 4    Period Weeks    Status New                 Plan - 10/04/20 1157    Clinical Impression Statement Reviewed proper body mechanics with  pt requiring verbal and demo cues for proper  technique when lifting objects such as groceries into the house. Pt able to demo understanding and reporting it felt better for his back. Pt reporting feeling less stiff at end of session and less pain after stretches and nerve glides. Will continue to progress towards LTGs.    Personal Factors and Comorbidities Comorbidity 2;Past/Current Experience;Time since onset of injury/illness/exacerbation    Comorbidities PMH: HTN, Polycythemia rubra vera    Examination-Activity Limitations Locomotion Level;Stairs    Examination-Participation Restrictions Community Activity   playing golf   Stability/Clinical Decision Making Stable/Uncomplicated    Rehab Potential Good    PT Frequency 2x / week    PT Duration 4 weeks    PT Treatment/Interventions ADLs/Self Care Home Management;Gait training;Stair training;Functional mobility training;Therapeutic activities;Therapeutic exercise;Balance training;Patient/family education;Neuromuscular re-education;Manual techniques;Passive range of motion    PT Next Visit Plan prone press ups for trunk extension. sciatic nerve glides (supine and seated). hamstring and piriformis stretching, glute/ABD strength, core stabilization. warm up on NuStep/SciFit.    Consulted and Agree with Plan of Care Patient           Patient will benefit from skilled therapeutic intervention in order to improve the following deficits and impairments:  Decreased activity tolerance,Decreased range of motion,Decreased mobility,Decreased strength,Difficulty walking,Pain,Improper body mechanics,Impaired sensation,Increased muscle spasms,Hypomobility  Visit Diagnosis: Chronic left-sided low back pain with left-sided sciatica  Muscle weakness (generalized)  Other disturbances of skin sensation     Problem List Patient Active Problem List   Diagnosis Date Noted  . Need for prophylactic vaccination and inoculation against influenza 06/17/2016  . Polycythemia rubra vera ([place]) 09/02/2011  .  HZ (herpes zoster) 09/02/2011    Arliss Journey, PT, DPT  10/04/2020, 11:58 AM  [facility] [address] [place], [place], [address] Phone: [phone]   Fax:  [phone]  Name: MIHAEL GLYNN MRN: [id number] Date of Birth: [birth year]

## 2020-10-09 ENCOUNTER — Ambulatory Visit: Payer: Medicare Other | Admitting: Physical Therapy

## 2020-10-11 ENCOUNTER — Other Ambulatory Visit: Payer: Self-pay

## 2020-10-11 ENCOUNTER — Encounter: Payer: Self-pay | Admitting: Physical Therapy

## 2020-10-11 ENCOUNTER — Ambulatory Visit: Payer: Medicare Other | Admitting: Physical Therapy

## 2020-10-11 DIAGNOSIS — M6281 Muscle weakness (generalized): Secondary | ICD-10-CM | POA: Diagnosis not present

## 2020-10-11 DIAGNOSIS — R208 Other disturbances of skin sensation: Secondary | ICD-10-CM

## 2020-10-11 DIAGNOSIS — G8929 Other chronic pain: Secondary | ICD-10-CM

## 2020-10-11 DIAGNOSIS — M5442 Lumbago with sciatica, left side: Secondary | ICD-10-CM | POA: Diagnosis not present

## 2020-10-11 NOTE — Therapy (Signed)
[facility] [address] [place], [place], [address] Phone: [phone]   Fax:  [phone]  Physical Therapy Treatment  Patient Details  Name: Jesse [name] MRN: [id number] Date of Birth: [birth year] Referring Provider (PT): Andrey Spearman, MD   Encounter Date: 10/11/2020   PT End of Session - 10/11/20 1103    Visit Number 7    Number of Visits 9    Date for PT Re-Evaluation 11/18/20   written for 4 week POC   Authorization Type [employer] Medicare    PT Start Time 0930    PT Stop Time 1013    PT Time Calculation (min) 43 min    Equipment Utilized During Treatment Gait belt    Activity Tolerance Patient tolerated treatment well    Behavior During Therapy [facility] for tasks assessed/performed           Past Medical History:  Diagnosis Date  . Depression   . Hypercholesteremia   . Hypertension   . HZ (herpes zoster) 09/02/2011  . Neuritis    left foot  . Polycythemia rubra vera ([place]) 09/02/2011    Past Surgical History:  Procedure Laterality Date  . GAS INSERTION  06/04/2012   Procedure: INSERTION OF GAS;  Surgeon: Hayden Pedro, MD;  Location: [employer] [facility];  Service: Ophthalmology;  Laterality: Left;  C3F8  . SCLERAL BUCKLE  06/04/2012   Procedure: SCLERAL BUCKLE;  Surgeon: Hayden Pedro, MD;  Location: [facility];  Service: Ophthalmology;  Laterality: Left;  Headscope laser    There were no vitals filed for this visit.   Subjective Assessment - 10/11/20 0932    Subjective Reports it is feeling about the same. still hurting in lateral calf.    Pertinent History PMH: HTN    Limitations Walking    Diagnostic tests MRI from 10/24: 2.    At L4-L5, degenerative changes cause moderately severe left foraminal narrowing and severe left lateral recess stenosis with potential for left L4 and left L5 nerve root compression.  3.    At L5-S1, degenerative changes cause moderately severe right lateral recess stenosis with potential  for right S1 nerve root compression.    Patient Stated Goals wants to get rid of the pain.    Currently in Pain? No/denies   "no real pain, just feeling uneasy in L leg"                            [facility] Adult PT Treatment/Exercise - 10/11/20 [id number]      Neuro Re-ed    Neuro Re-ed Details  performed seated sciatic nerve tensioner in sitting with slumped posture and knee extension/flexion x10 reps - pt needing cues for ankle DF and slow and controlled (reviewed from HEP). then performed seated nerve tensioner in sitting with knee flexion/slumped posture and knee extension/upright posture 2 x 10 reps - with pt needing multimodal cues (demonstration, verbal, and manual) for proper technique and to perform slowly. pt reporting a decr in radicular sx afterwards      Lumbar Exercises: Stretches   Passive Hamstring Stretch 4 reps;Left;30 seconds    Passive Hamstring Stretch Limitations pt supine on mat table, within pt's tolerable range, then performing gentle ankle inversion/back to middle while in position 2 x 10 reps with pt feeling stretch in lateral calf, PT needing to move pt's foot as pt unable to sequence properly    Press Ups 10 reps;Limitations  Press Ups Limitations prone on elbows, attempted x5 reps with prone press ups with elbows extended, pt reporting it was too difficult and pt unable to perform today    Other Lumbar Stretch Exercise pt reports not performing seated hamstring stretch at home, reviewed proper technique with pt (pt with tendency to get it mixed up with seated nerve glide) 2 x 30 seconds, then attempted a supine hamstring stretch with use of belt to help get into position, pt unable to perform even with multi modal cues      Lumbar Exercises: Aerobic   Nustep with BLE and BUE for strengthening, activity tolerance, and ROM gear 4.0 for 6 minutes      Knee/Hip Exercises: Supine   Bridges with [employer] Strengthening;2 sets   2 x 6 reps, verbal and demo  cues for proper technique   Other Supine Knee/Hip Exercises bent knee fall outs with red tband: x10 reps B with cues for slowed and controlled and proper technique                   PT Education - 10/11/20 1015    Education Details reviewed HEP and rationale behind exercises    Person(s) Educated Patient    Methods Explanation;Verbal cues;Demonstration    Comprehension Verbalized understanding;Returned demonstration            PT Short Term Goals - 09/19/20 1433      PT SHORT TERM GOAL #1   Title ALL STGS = LTGS             PT Long Term Goals - 09/20/20 1219      PT LONG TERM GOAL #1   Title Pt will be independent with final HEP in order to build upon functional gains made in therapy. ALL LTGS DUE 10/17/20    Time 4    Period Weeks    Status New      PT LONG TERM GOAL #2   Title Pt will decr ODI score to 14% or less in order to demo decr disability related to back pain.    Baseline 20% on 09/20/20    Time 4    Period Weeks    Status Revised      PT LONG TERM GOAL #3   Title Pt will perform 8 steps with single handrail mod I with reports of no low back pain in order to safely enter/exit home.    Time 4    Period Weeks    Status New      PT LONG TERM GOAL #4   Title Pt will report low back pain at its worst to a 3/10 or less in order to return to golf.    Baseline 6-7/10 pain at its worst.    Time 4    Period Weeks    Status New      PT LONG TERM GOAL #5   Title Pt will ambulate at least 1,000' over outdoor unlevel surfaces with mod I and low back pain of 2/10 or less in order to improve functional mobility without needing to stop and take rest breaks.    Time 4    Period Weeks    Status New                 Plan - 10/11/20 1121    Clinical Impression Statement Pt reporting centralization of radicular symptoms down L leg after seated sciatic nerve tensioners and at the end of the session. Reporting no  longer feeling anything in L leg, but now  just symptoms in L low back (not as bad as when he came in). Continued to review and discuss importance of exercises at home as pt reports feeling better after PT sessions but is not performing exercises consistently at home.    Personal Factors and Comorbidities Comorbidity 2;Past/Current Experience;Time since onset of injury/illness/exacerbation    Comorbidities PMH: HTN, Polycythemia rubra vera    Examination-Activity Limitations Locomotion Level;Stairs    Examination-Participation Restrictions Community Activity   playing golf   Stability/Clinical Decision Making Stable/Uncomplicated    Rehab Potential Good    PT Frequency 2x / week    PT Duration 4 weeks    PT Treatment/Interventions ADLs/Self Care Home Management;Gait training;Stair training;Functional mobility training;Therapeutic activities;Therapeutic exercise;Balance training;Patient/family education;Neuromuscular re-education;Manual techniques;Passive range of motion    PT Next Visit Plan check LTGs - discuss re-cert vs. D/C? sciatic nerve glides - supine and seated, mobilizations to lumbar spine as indicated, hamstring stretching.    Consulted and Agree with Plan of Care Patient           Patient will benefit from skilled therapeutic intervention in order to improve the following deficits and impairments:  Decreased activity tolerance,Decreased range of motion,Decreased mobility,Decreased strength,Difficulty walking,Pain,Improper body mechanics,Impaired sensation,Increased muscle spasms,Hypomobility  Visit Diagnosis: Muscle weakness (generalized)  Other disturbances of skin sensation  Chronic left-sided low back pain with left-sided sciatica     Problem List Patient Active Problem List   Diagnosis Date Noted  . Need for prophylactic vaccination and inoculation against influenza 06/17/2016  . Polycythemia rubra vera ([place]) 09/02/2011  . HZ (herpes zoster) 09/02/2011    Arliss Journey, PT, DPT  10/11/2020, 11:23  AM  [facility] [address] [place], [place], [address] Phone: [phone]   Fax:  [phone]  Name: RYUN VELEZ MRN: [id number] Date of Birth: 14-Jun-1941

## 2020-10-16 ENCOUNTER — Encounter: Payer: Self-pay | Admitting: Physical Therapy

## 2020-10-16 ENCOUNTER — Other Ambulatory Visit: Payer: Self-pay

## 2020-10-16 ENCOUNTER — Ambulatory Visit: Payer: Medicare Other | Admitting: Physical Therapy

## 2020-10-16 DIAGNOSIS — M5442 Lumbago with sciatica, left side: Secondary | ICD-10-CM | POA: Diagnosis not present

## 2020-10-16 DIAGNOSIS — M6281 Muscle weakness (generalized): Secondary | ICD-10-CM | POA: Diagnosis not present

## 2020-10-16 DIAGNOSIS — G8929 Other chronic pain: Secondary | ICD-10-CM | POA: Diagnosis not present

## 2020-10-16 DIAGNOSIS — R208 Other disturbances of skin sensation: Secondary | ICD-10-CM | POA: Diagnosis not present

## 2020-10-16 NOTE — Patient Instructions (Signed)
Access Code: [email or web address] URL: [email or web address] Date: 10/16/2020 Prepared by: Janann August  Exercises Seated Hamstring Stretch - 1-2 x daily - 5 x weekly - 3 sets - 25-30 hold Supine Figure 4 Piriformis Stretch - 1-2 x daily - 5 x weekly - 3 sets - 30 hold Lower Trunk Rotations - 1-2 x daily - 5 x weekly - 2 sets - 10 reps Prone Press Up on Elbows - 1 x daily - 5 x weekly - 1-2 sets - 10 reps Seated Sciatic Tensioner - 2 x daily - 5 x weekly - 2 sets - 10 reps Standing Lumbar Spine Flexion Stretch Counter - 1 x daily - 5 x weekly - 2 sets - 10 reps Standing Lumbar Extension - 1-2 x daily - 5 x weekly - 2 sets - 10 reps

## 2020-10-16 NOTE — Therapy (Signed)
[facility] [address] [place], [place], [address] Phone: [phone]   Fax:  [phone]  Physical Therapy Treatment/Re-Cert   Patient Details  Name: Jesse [name] MRN: [id number] Date of Birth: 12/05/1940 Referring Provider (PT): Andrey Spearman, MD   Encounter Date: 10/16/2020   PT End of Session - 10/16/20 1220    Visit Number 8    Number of Visits 12    Date for PT Re-Evaluation 11/18/20   written for 4 week POC   Authorization Type [employer] Medicare    PT Start Time 1018    PT Stop Time 1100    PT Time Calculation (min) 42 min    Equipment Utilized During Treatment Gait belt    Activity Tolerance Patient tolerated treatment well    Behavior During Therapy [facility] for tasks assessed/performed           Past Medical History:  Diagnosis Date  . Depression   . Hypercholesteremia   . Hypertension   . HZ (herpes zoster) 09/02/2011  . Neuritis    left foot  . Polycythemia rubra vera ([place]) 09/02/2011    Past Surgical History:  Procedure Laterality Date  . GAS INSERTION  06/04/2012   Procedure: INSERTION OF GAS;  Surgeon: Hayden Pedro, MD;  Location: [facility];  Service: Ophthalmology;  Laterality: Left;  C3F8  . SCLERAL BUCKLE  06/04/2012   Procedure: SCLERAL BUCKLE;  Surgeon: Hayden Pedro, MD;  Location: [facility];  Service: Ophthalmology;  Laterality: Left;  Headscope laser    There were no vitals filed for this visit.   Subjective Assessment - 10/16/20 1020    Subjective No changes since he was last here. Pt reports pain is worse going up the uneven surface and up an incline after getting the mail from his mailbox.    Pertinent History PMH: HTN    Limitations Walking    Diagnostic tests MRI from 10/24: 2.    At L4-L5, degenerative changes cause moderately severe left foraminal narrowing and severe left lateral recess stenosis with potential for left L4 and left L5 nerve root compression.  3.    At L5-S1,  degenerative changes cause moderately severe right lateral recess stenosis with potential for right S1 nerve root compression.    Patient Stated Goals wants to get rid of the pain.    Currently in Pain? No/denies   "pain has eased off now"             [facility] PT Assessment - 10/16/20 1029      Assessment   Medical Diagnosis L lumbar radiculopathy    Referring Provider (PT) Andrey Spearman, MD      Prior Function   Level of Independence Independent      Observation/Other Assessments   Other Surveys  Oswestry Disability Index    Oswestry Disability Index  24% = moderate disability      AROM   Lumbar Flexion able to touch toes with bending legs, no pain in low back, feels pain in L lateral calf    Lumbar Extension 50% limited   reports improvements in sx after 10 reps   Lumbar - Right Side Bend 25% limited   no pain   Lumbar - Left Side Bend 25% limited   pain in L low back     Strength   Right/Left Hip Left    Left Hip Flexion 4/5    Left Knee Flexion 4/5    Left Knee Extension 4/5  Left Ankle Dorsiflexion 5/5                         [facility] Adult PT Treatment/Exercise - 10/16/20 0001      Ambulation/Gait   Stairs Yes    Stairs Assistance 6: Modified independent (Device/Increase time)   pt reports no low back pain after or while performing   Stair Management Technique One rail Right;Alternating pattern;Forwards    Number of Stairs 8    Height of Stairs 6      Lumbar Exercises: Stretches   Passive Hamstring Stretch Left;30 seconds;3 reps    Passive Hamstring Stretch Limitations pt supine on mat table, with additional L ankle DF stretch to pt's tolerance    Other Lumbar Stretch Exercise pt supine on mat table: therapist performing manual distraction 3 x 30 seconds through LLE, pt reporting feeling a good stretch, no change in sx      Lumbar Exercises: Standing   Other Standing Lumbar Exercises 2 x 10 reps standing lumbar extension: verbal and demo cues  for proper technique, pt reporting a relief in sx and just feels tightness - added to pt's HEP      Lumbar Exercises: Seated   Other Seated Lumbar Exercises performed seated nerve tensioner in sitting with knee flexion/slumped posture and knee extension/upright posture 2 x 10 reps - with pt needing multimodal cues (demonstration, verbal, and manual) for proper technique and to perform slowly. pt reporting a decr in radicular sx afterwards. at home pt just performing knee flexion/extension in slumped posture as pt unable to coordinate additional movement without assist from therapist, pt needs continued cues to slow down motion when performing at home and to keep L foot in ankle DF                  PT Education - 10/16/20 1220    Education Details adding standing lumbar extension to HEP, rationale of exercises, reviewed pt's diagnosis in relation to MRI findings, re-cert for an additional 1x week for 4 weeks    Person(s) Educated Patient    Methods Explanation;Demonstration;Handout;Verbal cues    Comprehension Verbalized understanding;Returned demonstration;Verbal cues required;Need further instruction            PT Short Term Goals - 09/19/20 1433      PT SHORT TERM GOAL #1   Title ALL STGS = LTGS             PT Long Term Goals - 10/16/20 1021      PT LONG TERM GOAL #1   Title Pt will be independent with final HEP in order to build upon functional gains made in therapy. ALL LTGS DUE 10/17/20    Baseline pt still needing cues on how to perform correctly, reports performing intermittently at home    Time 4    Period Weeks    Status Partially Met      PT LONG TERM GOAL #2   Title Pt will decr ODI score to 14% or less in order to demo decr disability related to back pain.    Baseline 20% on 09/20/20, 24% on 10/16/20    Time 4    Period Weeks    Status Not Met      PT LONG TERM GOAL #3   Title Pt will perform 8 steps with single handrail mod I with reports of no low back  pain in order to safely enter/exit home.    Baseline able to  perform today with no reports of pain    Time 4    Period Weeks    Status Achieved      PT LONG TERM GOAL #4   Title Pt will report low back pain at its worst to a 3/10 or less in order to return to golf.    Baseline 6-7/10 pain at its worst, 4/10 on 10/16/20    Time 4    Period Weeks    Status Not Met      PT LONG TERM GOAL #5   Title Pt will ambulate at least 1,000' over outdoor unlevel surfaces with mod I and low back pain of 2/10 or less in order to improve functional mobility without needing to stop and take rest breaks.    Baseline did not assess on 10/16/20    Time 4    Period Weeks    Status New          Revised/ongoing LTGs for re-cert:      PT Long Term Goals - 10/16/20 1604      PT LONG TERM GOAL #1   Title Pt will be independent with final HEP in order to build upon functional gains made in therapy. ALL LTGS DUE 11/13/20    Baseline pt still needing cues on how to perform correctly, reports performing intermittently at home    Time 4    Period Weeks    Status On-going    Target Date 11/13/20      PT LONG TERM GOAL #2   Title Pt will decr ODI score to 18% or less in order to demo decr disability related to back pain.    Baseline 20% on 09/20/20, 24% on 10/16/20    Time 4    Period Weeks    Status On-going      PT LONG TERM GOAL #3   Title Pt will report being able to walk to his mailbox and back to his house (approx 70') with 2/10 or less low back pain in order to improve functional mobility.    Time 4    Period Weeks    Status New      PT LONG TERM GOAL #4   Title Pt will report low back pain at its worst to a 3/10 or less in order to improve functional mobility.    Baseline 6-7/10 pain at its worst, 4/10 on 10/16/20    Time 4    Period Weeks    Status Revised               Plan - 10/16/20 1602    Clinical Impression Statement Today's skilled session focused on assessing pt's LTGs. Pt  achieved LTG #3 - able to perform 8 steps with single handrail with no reports of low back pain. Pt did not meet LTGs #2 and #4. Although pt reports that he has felt improvement since coming to PT, his ODI score incr to a 24% (previously 20%), indicating pt is now at a moderate disability. Pt with improvement of low back pain when it is rated at its worst, but not quite to goal level. Pt partially met LTG #1 - reports he is performing his exercise program intermittently at home, but still needs cues on how to perform correctly. Pt demonstrating improvements in lumbar ROM with less reports of low back pain or radicular sx and pt reporting a decr in pain and no more radicular sx after PT sessions with exercise/nerve glides. Pt still reporting  incr low back pain when having to walk up the incline when going out to his mailbox at home. Will re-cert for an additional 1x week for 4 weeks to continue to work on L low back pain, LLE weakness, decr LLE hamstring ROM, and L radicular sx in order to improve functional mobility and independence. LTGs revised as appropriate.    Personal Factors and Comorbidities Comorbidity 2;Past/Current Experience;Time since onset of injury/illness/exacerbation    Comorbidities PMH: HTN, Polycythemia rubra vera    Examination-Activity Limitations Locomotion Level;Stairs    Examination-Participation Restrictions Community Activity   playing golf   Stability/Clinical Decision Making Stable/Uncomplicated    Rehab Potential Good    PT Frequency 1x / week    PT Duration 4 weeks    PT Treatment/Interventions ADLs/Self Care Home Management;Gait training;Stair training;Functional mobility training;Therapeutic activities;Therapeutic exercise;Balance training;Patient/family education;Neuromuscular re-education;Manual techniques;Passive range of motion    PT Next Visit Plan sciatic nerve glides - supine and seated, review standing lumbar extensions to HEP, LLE hip strengthening, mobilizations  to lumbar spine as indicated, gait on treadmill for warm up.    Consulted and Agree with Plan of Care Patient           Patient will benefit from skilled therapeutic intervention in order to improve the following deficits and impairments:  Decreased activity tolerance,Decreased range of motion,Decreased mobility,Decreased strength,Difficulty walking,Pain,Improper body mechanics,Impaired sensation,Increased muscle spasms,Hypomobility  Visit Diagnosis: Muscle weakness (generalized)  Other disturbances of skin sensation  Chronic left-sided low back pain with left-sided sciatica     Problem List Patient Active Problem List   Diagnosis Date Noted  . Need for prophylactic vaccination and inoculation against influenza 06/17/2016  . Polycythemia rubra vera ([place]) 09/02/2011  . HZ (herpes zoster) 09/02/2011    Arliss Journey, PT, DPT  10/16/2020, 4:03 PM  [facility] [address] [place], [place], [address] Phone: [phone]   Fax:  [phone]  Name: Jesse [name] MRN: [id number] Date of Birth: 07-31-1941

## 2020-10-26 ENCOUNTER — Encounter: Payer: Self-pay | Admitting: Physical Therapy

## 2020-10-26 ENCOUNTER — Other Ambulatory Visit: Payer: Self-pay

## 2020-10-26 ENCOUNTER — Ambulatory Visit: Payer: Medicare Other | Attending: Diagnostic Neuroimaging | Admitting: Physical Therapy

## 2020-10-26 DIAGNOSIS — M6281 Muscle weakness (generalized): Secondary | ICD-10-CM

## 2020-10-26 DIAGNOSIS — R208 Other disturbances of skin sensation: Secondary | ICD-10-CM

## 2020-10-26 DIAGNOSIS — G8929 Other chronic pain: Secondary | ICD-10-CM

## 2020-10-26 DIAGNOSIS — M5442 Lumbago with sciatica, left side: Secondary | ICD-10-CM | POA: Diagnosis not present

## 2020-10-26 NOTE — Therapy (Signed)
[facility] [address] [place], [place], [address] Phone: [phone]   Fax:  [phone]  Physical Therapy Treatment  Patient Details  Name: Jesse [name] MRN: [id number] Date of Birth: [birth year] Referring Provider (PT): Andrey Spearman, MD   Encounter Date: 10/26/2020   PT End of Session - 10/26/20 1127    Visit Number 9    Number of Visits 12    Date for PT Re-Evaluation 11/18/20   written for 4 week POC   Authorization Type [employer] Medicare    PT Start Time [phone]    PT Stop Time 0927    PT Time Calculation (min) 41 min    Activity Tolerance Patient tolerated treatment well    Behavior During Therapy [facility] for tasks assessed/performed           Past Medical History:  Diagnosis Date  . Depression   . Hypercholesteremia   . Hypertension   . HZ (herpes zoster) 09/02/2011  . Neuritis    left foot  . Polycythemia rubra vera ([place]) 09/02/2011    Past Surgical History:  Procedure Laterality Date  . GAS INSERTION  06/04/2012   Procedure: INSERTION OF GAS;  Surgeon: Hayden Pedro, MD;  Location: [place];  Service: Ophthalmology;  Laterality: Left;  C3F8  . SCLERAL BUCKLE  06/04/2012   Procedure: SCLERAL BUCKLE;  Surgeon: Hayden Pedro, MD;  Location: [place];  Service: Ophthalmology;  Laterality: Left;  Headscope laser    There were no vitals filed for this visit.   Subjective Assessment - 10/26/20 0848    Subjective Reports it is getting a tad better.    Pertinent History PMH: HTN    Limitations Walking    Diagnostic tests MRI from 10/24: 2.    At L4-L5, degenerative changes cause moderately severe left foraminal narrowing and severe left lateral recess stenosis with potential for left L4 and left L5 nerve root compression.  3.    At L5-S1, degenerative changes cause moderately severe right lateral recess stenosis with potential for right S1 nerve root compression.    Patient Stated Goals wants to get rid of the  pain.    Currently in Pain? No/denies                             [facility] Adult PT Treatment/Exercise - 10/26/20 0925      Lumbar Exercises: Stretches   Other Lumbar Stretch Exercise with pt prone: quad/hip flexor stretch with MET on LLE, x5 reps of 30 seconds each      Lumbar Exercises: Aerobic   Tread Mill attempted walking on the treadmill for an aerobic warmup - performed at speed .5 mph with pt holding on, pt unable to coordinate movement on treadmill despite cues as pt with performing with incr forward flexed posture, going too far back on treadmill and performing with shuffled steps. ogt off after approx. 1 minute and decided to use scifit as warmup    Other Aerobic Exercise SciFit level 3.0 for 5 minutes with BLE and BUE for aerobic warm up, strengthening, ROM      Lumbar Exercises: Standing   Other Standing Lumbar Exercises 2 x 10 reps standing lumbar extension: verbal and demo cues for proper technique, pt reporting feeling a decr in back pain when performing as HEP      Lumbar Exercises: Supine   Bridge with [employer] 10 reps   cues for full  ROM   Bridge with clamshell 10 reps   with green tband, cues for full ROM   Other Supine Lumbar Exercises supine: bent knee fall outs with green tband 2 x 10 reps B, with verbal and tactile cues for technique      Manual Therapy   Manual Therapy Joint mobilization    Joint Mobilization grade II/III CPA mobilizations to L2-L5, pt with incr hypomobility at L2/L3 and L3/L4. pt reporting feeling relief                  PT Education - 10/26/20 1126    Education Details reviewed standing lumbar extension to HEP, discussed with pt incr activity throughout the day - getting up and walking/doing one of his exercises every 1 hour/hour and a half (as pt currently spending the majority of the day sitting/laying in bed)    Person(s) Educated Patient    Methods Explanation    Comprehension Verbalized understanding;Returned  demonstration            PT Short Term Goals - 09/19/20 1433      PT SHORT TERM GOAL #1   Title ALL STGS = LTGS             PT Long Term Goals - 10/16/20 1604      PT LONG TERM GOAL #1   Title Pt will be independent with final HEP in order to build upon functional gains made in therapy. ALL LTGS DUE 11/13/20    Baseline pt still needing cues on how to perform correctly, reports performing intermittently at home    Time 4    Period Weeks    Status On-going    Target Date 11/13/20      PT LONG TERM GOAL #2   Title Pt will decr ODI score to 18% or less in order to demo decr disability related to back pain.    Baseline 20% on 09/20/20, 24% on 10/16/20    Time 4    Period Weeks    Status On-going      PT LONG TERM GOAL #3   Title Pt will report being able to walk to his mailbox and back to his house (approx 70') with 2/10 or less low back pain in order to improve functional mobility.    Time 4    Period Weeks    Status New      PT LONG TERM GOAL #4   Title Pt will report low back pain at its worst to a 3/10 or less in order to improve functional mobility.    Baseline 6-7/10 pain at its worst, 4/10 on 10/16/20    Time 4    Period Weeks    Status Revised                 Plan - 10/26/20 1135    Clinical Impression Statement Today's skilled session focused on BLE strengthening, exercises/mobilizations for lumbar extension, and stretching for pt's L hip flexor/quad. Pt reporting feeling better and less stiff at end of session. Discussed importance of incr activity at home - trying to get up every hour to walk around the house/doing an exercise as pt reports feeling better with movement and is currently sitting/laying down in bed most of the day. Will continue to progress towards LTGs.    Personal Factors and Comorbidities Comorbidity 2;Past/Current Experience;Time since onset of injury/illness/exacerbation    Comorbidities PMH: HTN, Polycythemia rubra vera     Examination-Activity Limitations Locomotion Level;Stairs  Examination-Participation Restrictions Community Activity   playing golf   Stability/Clinical Decision Making Stable/Uncomplicated    Rehab Potential Good    PT Frequency 1x / week    PT Duration 4 weeks    PT Treatment/Interventions ADLs/Self Care Home Management;Gait training;Stair training;Functional mobility training;Therapeutic activities;Therapeutic exercise;Balance training;Patient/family education;Neuromuscular re-education;Manual techniques;Passive range of motion    PT Next Visit Plan seated core work on physioball. sciatic nerve glides - supine and seated, LLE hip strengthening and core work, mobilizations to lumbar spine as indicated, SciFit/NuStep warm up.    Consulted and Agree with Plan of Care Patient           Patient will benefit from skilled therapeutic intervention in order to improve the following deficits and impairments:  Decreased activity tolerance,Decreased range of motion,Decreased mobility,Decreased strength,Difficulty walking,Pain,Improper body mechanics,Impaired sensation,Increased muscle spasms,Hypomobility  Visit Diagnosis: Muscle weakness (generalized)  Other disturbances of skin sensation  Chronic left-sided low back pain with left-sided sciatica     Problem List Patient Active Problem List   Diagnosis Date Noted  . Need for prophylactic vaccination and inoculation against influenza 06/17/2016  . Polycythemia rubra vera ([place]) 09/02/2011  . HZ (herpes zoster) 09/02/2011     Arliss Journey, PT, DPT  10/26/2020, 11:37 AM  [facility] [address] [place], [place], [address] Phone: [phone]   Fax:  [phone]  Name: Jesse [name] MRN: [id number] Date of Birth: 03/19/1941

## 2020-10-30 DIAGNOSIS — E538 Deficiency of other specified B group vitamins: Secondary | ICD-10-CM | POA: Diagnosis not present

## 2020-11-02 ENCOUNTER — Encounter: Payer: Self-pay | Admitting: Physical Therapy

## 2020-11-02 ENCOUNTER — Ambulatory Visit: Payer: Medicare Other | Admitting: Physical Therapy

## 2020-11-02 ENCOUNTER — Other Ambulatory Visit: Payer: Self-pay

## 2020-11-02 DIAGNOSIS — G8929 Other chronic pain: Secondary | ICD-10-CM

## 2020-11-02 DIAGNOSIS — R208 Other disturbances of skin sensation: Secondary | ICD-10-CM | POA: Diagnosis not present

## 2020-11-02 DIAGNOSIS — M5442 Lumbago with sciatica, left side: Secondary | ICD-10-CM

## 2020-11-02 DIAGNOSIS — M6281 Muscle weakness (generalized): Secondary | ICD-10-CM

## 2020-11-02 NOTE — Therapy (Signed)
[facility] [address] [place], [place], [address] Phone: [phone]   Fax:  [phone]  Physical Therapy Treatment/10th Visit Progress Note  Patient Details  Name: Jesse [name] MRN: [id number] Date of Birth: April 15, 1941 Referring Provider (PT): Andrey Spearman, MD  10th Visit Physical Therapy Progress Note  Dates of Reporting Period: 09/19/20 to 11/02/20    Encounter Date: 11/02/2020   PT End of Session - 11/02/20 1118    Visit Number 10    Number of Visits 12    Date for PT Re-Evaluation 11/18/20   written for 4 week POC   Authorization Type [employer] Medicare    PT Start Time 0929    PT Stop Time 1014    PT Time Calculation (min) 45 min    Activity Tolerance Patient tolerated treatment well    Behavior During Therapy [facility] for tasks assessed/performed           Past Medical History:  Diagnosis Date  . Depression   . Hypercholesteremia   . Hypertension   . HZ (herpes zoster) 09/02/2011  . Neuritis    left foot  . Polycythemia rubra vera ([place]) 09/02/2011    Past Surgical History:  Procedure Laterality Date  . GAS INSERTION  06/04/2012   Procedure: INSERTION OF GAS;  Surgeon: Hayden Pedro, MD;  Location: [place];  Service: Ophthalmology;  Laterality: Left;  C3F8  . SCLERAL BUCKLE  06/04/2012   Procedure: SCLERAL BUCKLE;  Surgeon: Hayden Pedro, MD;  Location: [place];  Service: Ophthalmology;  Laterality: Left;  Headscope laser    There were no vitals filed for this visit.   Subjective Assessment - 11/02/20 0930    Subjective Pt reports he has been moving around more at home. Gabapentin helps the pain in his lateral calf.    Pertinent History PMH: HTN    Limitations Walking    Diagnostic tests MRI from 10/24: 2.    At L4-L5, degenerative changes cause moderately severe left foraminal narrowing and severe left lateral recess stenosis with potential for left L4 and left L5 nerve root compression.  3.     At L5-S1, degenerative changes cause moderately severe right lateral recess stenosis with potential for right S1 nerve root compression.    Patient Stated Goals wants to get rid of the pain.    Currently in Pain? No/denies                             [facility] Adult PT Treatment/Exercise - 11/02/20 0935      Exercises   Exercises Other Exercises    Other Exercises  reviewed seated sciatic tensioner for HEP - cues for slumped posture and proper technique with ankle DF x10 reps, pt reports improvement in L lateral calf afterwards, discussed importance of performing everyday (pt not performing all of exercises at home)      Lumbar Exercises: Stretches   Passive Hamstring Stretch Left;30 seconds;3 reps    Passive Hamstring Stretch Limitations pt supine on mat table, with additional L ankle DF stretch to pt's tolerance    Other Lumbar Stretch Exercise single knee to chest stretch with therapist assisting with gentle over pressure to LLE 2 x 30 seconds      Lumbar Exercises: Aerobic   Nustep with BLE and BUE for strengthening, activity tolerance, and ROM gear 4.0 for 6 minutes      Lumbar Exercises: Seated   Other  Seated Lumbar Exercises seated on blue physioball: x10 reps lateral weight shifting through hips, x10 reps B alternating marching with cues for sequencing, technique, and core activation, pt needing UE support at times for balance, min guard      Lumbar Exercises: Supine   Bridge with [employer] 10 reps    Bridge with [employer] Limitations cues for ball squeeze first and full ROM    Other Supine Lumbar Exercises supine: bent knee fall outs with green tband 2 x 10 reps B, with verbal and tactile cues for technique    Other Supine Lumbar Exercises B hip flexion with use of green tband around distal thighs x10 reps B, cues for slowed and controlled      Lumbar Exercises: Sidelying   Clam Limitations 2 x10 reps each side, manual and verbal cues for technique with  therapist also helping pt maintain hips in proper position                  PT Education - 11/02/20 1117    Education Details continued to educate on importance of incr activity throughout day and performing HEP, reviewed seated sciatic nerve glide for HEP    Person(s) Educated Patient    Methods Explanation;Demonstration;Tactile cues   demo cues   Comprehension Verbalized understanding;Returned demonstration            PT Short Term Goals - 09/19/20 1433      PT SHORT TERM GOAL #1   Title ALL STGS = LTGS             PT Long Term Goals - 10/16/20 1604      PT LONG TERM GOAL #1   Title Pt will be independent with final HEP in order to build upon functional gains made in therapy. ALL LTGS DUE 11/13/20    Baseline pt still needing cues on how to perform correctly, reports performing intermittently at home    Time 4    Period Weeks    Status On-going    Target Date 11/13/20      PT LONG TERM GOAL #2   Title Pt will decr ODI score to 18% or less in order to demo decr disability related to back pain.    Baseline 20% on 09/20/20, 24% on 10/16/20    Time 4    Period Weeks    Status On-going      PT LONG TERM GOAL #3   Title Pt will report being able to walk to his mailbox and back to his house (approx 70') with 2/10 or less low back pain in order to improve functional mobility.    Time 4    Period Weeks    Status New      PT LONG TERM GOAL #4   Title Pt will report low back pain at its worst to a 3/10 or less in order to improve functional mobility.    Baseline 6-7/10 pain at its worst, 4/10 on 10/16/20    Time 4    Period Weeks    Status Revised                 Plan - 11/02/20 1119    Clinical Impression Statement Focus of today's skilled session was BLE strengthening, core activation, and hamstring/low back stretches. Pt reporting that low back pain is feeling better and less stiff and that L lateral calf discomfort is gone at end of PT sessions. Pt  reports that he is not performing  all of his exercises at home and pt needs cues on how to properly perform (might possibly be a cognitive component). Discussed importance of performing seated sciatic nerve glide properly (with review again today) and performing everyday at home due to relief of radicular sx after performing. Will continue to progress towards LTGs.    Personal Factors and Comorbidities Comorbidity 2;Past/Current Experience;Time since onset of injury/illness/exacerbation    Comorbidities PMH: HTN, Polycythemia rubra vera    Examination-Activity Limitations Locomotion Level;Stairs    Examination-Participation Restrictions Community Activity   playing golf   Stability/Clinical Decision Making Stable/Uncomplicated    Rehab Potential Good    PT Frequency 1x / week    PT Duration 4 weeks    PT Treatment/Interventions ADLs/Self Care Home Management;Gait training;Stair training;Functional mobility training;Therapeutic activities;Therapeutic exercise;Balance training;Patient/family education;Neuromuscular re-education;Manual techniques;Passive range of motion    PT Next Visit Plan mobilizations to lumbar spine, review HEP in its entirety. seated core work on physioball. sciatic nerve glides - supine and seated, LLE hip strengthening and core work. SciFit/NuStep warm up.    Consulted and Agree with Plan of Care Patient           Patient will benefit from skilled therapeutic intervention in order to improve the following deficits and impairments:  Decreased activity tolerance,Decreased range of motion,Decreased mobility,Decreased strength,Difficulty walking,Pain,Improper body mechanics,Impaired sensation,Increased muscle spasms,Hypomobility  Visit Diagnosis: Muscle weakness (generalized)  Other disturbances of skin sensation  Chronic left-sided low back pain with left-sided sciatica     Problem List Patient Active Problem List   Diagnosis Date Noted  . Need for prophylactic  vaccination and inoculation against influenza 06/17/2016  . Polycythemia rubra vera ([place]) 09/02/2011  . HZ (herpes zoster) 09/02/2011    Arliss Journey, PT, DPT  11/02/2020, 11:22 AM  [facility] [address] [place], [place], [address] Phone: [phone]   Fax:  [phone]  Name: Jesse [name] MRN: [id number] Date of Birth: 1941-08-11

## 2020-11-09 ENCOUNTER — Ambulatory Visit: Payer: Medicare Other | Admitting: Physical Therapy

## 2020-11-10 ENCOUNTER — Encounter: Payer: Self-pay | Admitting: Physical Therapy

## 2020-11-10 ENCOUNTER — Ambulatory Visit: Payer: Medicare Other | Admitting: Physical Therapy

## 2020-11-10 ENCOUNTER — Other Ambulatory Visit: Payer: Self-pay

## 2020-11-10 DIAGNOSIS — R208 Other disturbances of skin sensation: Secondary | ICD-10-CM

## 2020-11-10 DIAGNOSIS — G8929 Other chronic pain: Secondary | ICD-10-CM

## 2020-11-10 DIAGNOSIS — M5442 Lumbago with sciatica, left side: Secondary | ICD-10-CM | POA: Diagnosis not present

## 2020-11-10 DIAGNOSIS — M6281 Muscle weakness (generalized): Secondary | ICD-10-CM | POA: Diagnosis not present

## 2020-11-10 NOTE — Therapy (Signed)
[facility] [address] [place], [place], [address] Phone: [phone]   Fax:  [phone]  Physical Therapy Treatment  Patient Details  Name: Jesse [name] MRN: [id number] Date of Birth: 1941/06/18 Referring Provider (PT): Andrey Spearman, MD   Encounter Date: 11/10/2020   PT End of Session - 11/10/20 1207    Visit Number 11    Number of Visits 12    Date for PT Re-Evaluation 11/18/20   written for 4 week POC   Authorization Type [employer] Medicare    PT Start Time 1018    PT Stop Time 1058    PT Time Calculation (min) 40 min    Activity Tolerance Patient tolerated treatment well    Behavior During Therapy [facility] for tasks assessed/performed           Past Medical History:  Diagnosis Date  . Depression   . Hypercholesteremia   . Hypertension   . HZ (herpes zoster) 09/02/2011  . Neuritis    left foot  . Polycythemia rubra vera ([place]) 09/02/2011    Past Surgical History:  Procedure Laterality Date  . GAS INSERTION  06/04/2012   Procedure: INSERTION OF GAS;  Surgeon: [place] Pedro, MD;  Location: [place];  Service: Ophthalmology;  Laterality: Left;  C3F8  . SCLERAL BUCKLE  06/04/2012   Procedure: SCLERAL BUCKLE;  Surgeon: Hayden Pedro, MD;  Location: [place];  Service: Ophthalmology;  Laterality: Left;  Headscope laser    There were no vitals filed for this visit.   Subjective Assessment - 11/10/20 1105    Subjective Reports back pain is getting better.    Pertinent History PMH: HTN    Limitations Walking    Diagnostic tests MRI from 10/24: 2.    At L4-L5, degenerative changes cause moderately severe left foraminal narrowing and severe left lateral recess stenosis with potential for left L4 and left L5 nerve root compression.  3.    At L5-S1, degenerative changes cause moderately severe right lateral recess stenosis with potential for right S1 nerve root compression.    Patient Stated Goals wants to get rid of  the pain.    Currently in Pain? No/denies                             [facility] Adult PT Treatment/Exercise - 11/10/20 1106      Lumbar Exercises: Aerobic   Other Aerobic Exercise SciFit level 3.5 for 5 minutes with BLE and BUE for aerobic warm up, strengthening, ROM      Manual Therapy   Manual Therapy Joint mobilization    Joint Mobilization grade II/III CPA mobilizations to L2-L5, pt with hypomobility. pt reporting feeling relief afterwards.              Access Code: [email or web address] URL: [email or web address] Date: 11/10/2020 Prepared by: Janann August  Reviewed entirety of HEP - provided additional written instructions on pt's exercises for incr compliance. Reiterated importance of performing lumbar extension exercises after pt reports relief after mobilizations to lumbar spine to decr stiffness.   Exercises Seated Hamstring Stretch - 1-2 x daily - 5 x weekly - 3 sets - 25-30 hold - cues to hold stretch.  Supine Figure 4 Piriformis Stretch - 1-2 x daily - 5 x weekly - 3 sets - 30 hold - cues to gently press on leg for additional stretch  Lower Trunk Rotations - 1-2 x daily - 5 x weekly -  2 sets - 10 reps - cues for slowed and controlled and to hold for a couple seconds at end range  Prone Press Up on Elbows - 1 x daily - 5 x weekly - 1-2 sets - 10 reps - cues for slowed and controlled, pt attempted to press up onto hands, but was in too much discomfort  Seated Sciatic Tensioner - 2 x daily - 5 x weekly - 2 sets - 10 reps - reviewed performing SLOWLY and keeping L ankle in DF vs. PF and to sit with poor posture  Standing Lumbar Spine Flexion Stretch Counter - 1 x daily - 5 x weekly - 2 sets - 10 reps - cues to bring belly button to countertop for incr lumbar extension Standing Lumbar Extension - 1-2 x daily - 5 x weekly - 2 sets - 10 reps     PT Education - 11/10/20 1207    Education Details reviewed HEP in its entirety with cues provided.     Person(s) Educated Patient    Methods Explanation;Demonstration;Verbal cues    Comprehension Verbalized understanding;Returned demonstration;Verbal cues required            PT Short Term Goals - 09/19/20 1433      PT SHORT TERM GOAL #1   Title ALL STGS = LTGS             PT Long Term Goals - 10/16/20 1604      PT LONG TERM GOAL #1   Title Pt will be independent with final HEP in order to build upon functional gains made in therapy. ALL LTGS DUE 11/13/20    Baseline pt still needing cues on how to perform correctly, reports performing intermittently at home    Time 4    Period Weeks    Status On-going    Target Date 11/13/20      PT LONG TERM GOAL #2   Title Pt will decr ODI score to 18% or less in order to demo decr disability related to back pain.    Baseline 20% on 09/20/20, 24% on 10/16/20    Time 4    Period Weeks    Status On-going      PT LONG TERM GOAL #3   Title Pt will report being able to walk to his mailbox and back to his house (approx 70') with 2/10 or less low back pain in order to improve functional mobility.    Time 4    Period Weeks    Status New      PT LONG TERM GOAL #4   Title Pt will report low back pain at its worst to a 3/10 or less in order to improve functional mobility.    Baseline 6-7/10 pain at its worst, 4/10 on 10/16/20    Time 4    Period Weeks    Status Revised                 Plan - 11/10/20 1208    Clinical Impression Statement Performed grade II/III CPA mobilizations to lumbar spine to decr pain/stiffness with pt reporting relief afterwards. Reviewed HEP in its entirety today with pt needing proper cues for technique, esp with seated sciatic tensioner. Pt reports the most relief from this exercise when performed correctly with ankle held in DF and with poor posture (pt currently performing with ankle PF).  Needs cues to slow movements down at times. Will continue to progress towards LTGs.    Personal Factors and Comorbidities  Comorbidity 2;Past/Current Experience;Time since onset of injury/illness/exacerbation    Comorbidities PMH: HTN, Polycythemia rubra vera    Examination-Activity Limitations Locomotion Level;Stairs    Examination-Participation Restrictions Community Activity   playing golf   Stability/Clinical Decision Making Stable/Uncomplicated    Rehab Potential Good    PT Frequency 1x / week    PT Duration 4 weeks    PT Treatment/Interventions ADLs/Self Care Home Management;Gait training;Stair training;Functional mobility training;Therapeutic activities;Therapeutic exercise;Balance training;Patient/family education;Neuromuscular re-education;Manual techniques;Passive range of motion    PT Next Visit Plan re-cert vs. D/C? mobilizations to lumbar spine, seated core work on physioball. sciatic nerve glides - supine and seated, LLE hip strengthening and core work. SciFit/NuStep warm up.    Consulted and Agree with Plan of Care Patient           Patient will benefit from skilled therapeutic intervention in order to improve the following deficits and impairments:  Decreased activity tolerance,Decreased range of motion,Decreased mobility,Decreased strength,Difficulty walking,Pain,Improper body mechanics,Impaired sensation,Increased muscle spasms,Hypomobility  Visit Diagnosis: Muscle weakness (generalized)  Other disturbances of skin sensation  Chronic left-sided low back pain with left-sided sciatica     Problem List Patient Active Problem List   Diagnosis Date Noted  . Need for prophylactic vaccination and inoculation against influenza 06/17/2016  . Polycythemia rubra vera ([place]) 09/02/2011  . HZ (herpes zoster) 09/02/2011    Arliss Journey, PT, DPT  11/10/2020, 12:10 PM  [facility] [address] [place], [place], [address] Phone: [phone]   Fax:  [phone]  Name: Jesse [name] MRN: [id number] Date of Birth: May 09, 1941

## 2020-11-10 NOTE — Patient Instructions (Signed)
Access Code: [email or web address] URL: [email or web address] Date: 11/10/2020 Prepared by: Janann August  Exercises Seated Hamstring Stretch - 1-2 x daily - 5 x weekly - 3 sets - 25-30 hold Supine Figure 4 Piriformis Stretch - 1-2 x daily - 5 x weekly - 3 sets - 30 hold Lower Trunk Rotations - 1-2 x daily - 5 x weekly - 2 sets - 10 reps Prone Press Up on Elbows - 1 x daily - 5 x weekly - 1-2 sets - 10 reps Seated Sciatic Tensioner - 2 x daily - 5 x weekly - 2 sets - 10 reps Standing Lumbar Spine Flexion Stretch Counter - 1 x daily - 5 x weekly - 2 sets - 10 reps

## 2020-11-16 ENCOUNTER — Other Ambulatory Visit: Payer: Self-pay

## 2020-11-16 ENCOUNTER — Encounter: Payer: Self-pay | Admitting: Physical Therapy

## 2020-11-16 ENCOUNTER — Ambulatory Visit: Payer: Medicare Other | Admitting: Physical Therapy

## 2020-11-16 DIAGNOSIS — M6281 Muscle weakness (generalized): Secondary | ICD-10-CM

## 2020-11-16 DIAGNOSIS — R208 Other disturbances of skin sensation: Secondary | ICD-10-CM | POA: Diagnosis not present

## 2020-11-16 DIAGNOSIS — G8929 Other chronic pain: Secondary | ICD-10-CM | POA: Diagnosis not present

## 2020-11-16 DIAGNOSIS — M5442 Lumbago with sciatica, left side: Secondary | ICD-10-CM | POA: Diagnosis not present

## 2020-11-16 NOTE — Therapy (Addendum)
[facility] [address] [place], [place], [address] Phone: [phone]   Fax:  [phone]  Physical Therapy Treatment/Re-Cert   Patient Details  Name: Jesse [name] MRN: [id number] Date of Birth: 30-Aug-1941 Referring Provider (PT): Andrey Spearman, MD   Encounter Date: 11/16/2020   PT End of Session - 11/16/20 1150    Visit Number 12    Number of Visits 14    Date for PT Re-Evaluation 32/67/12   re-cert on 4/58/09   Authorization Type [employer] Medicare    PT Start Time 0931    PT Stop Time 1012    PT Time Calculation (min) 41 min    Activity Tolerance Patient tolerated treatment well    Behavior During Therapy [facility] for tasks assessed/performed           Past Medical History:  Diagnosis Date  . Depression   . Hypercholesteremia   . Hypertension   . HZ (herpes zoster) 09/02/2011  . Neuritis    left foot  . 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 GAS INSERTION  06/04/2012   Procedure: INSERTION OF GAS;  Surgeon: Hayden Pedro, MD;  Location: [facility];  Service: Ophthalmology;  Laterality: Left;  [id number]  . SCLERAL BUCKLE  06/04/2012   Procedure: SCLERAL BUCKLE;  Surgeon: Hayden Pedro, MD;  Location: [facility];  Service: Ophthalmology;  Laterality: Left;  Headscope laser    There were no vitals filed for this visit.   Subjective Assessment - 11/16/20 0932    Subjective Back feels better. Calf is feeling better. When he is going downhill to the mailbox is when it is irritated.    Pertinent History PMH: HTN    Limitations Walking    Diagnostic tests MRI from 10/24: 2.    At L4-L5, degenerative changes cause moderately severe left foraminal narrowing and severe left lateral recess stenosis with potential for left L4 and left L5 nerve root compression.  3.    At L5-S1, degenerative changes cause moderately severe right lateral recess stenosis with potential for  right S1 nerve root compression.    Patient Stated Goals wants to get rid of the pain.    Currently in Pain? No/denies              [facility] PT Assessment - 11/16/20 0001      Assessment   Medical Diagnosis L lumbar radiculopathy    Referring Provider (PT) Andrey Spearman, MD                  Access Code: [email or web address] URL: [email or web address] Date: 11/16/2020 Prepared by: Janann August  Showed pt how to pull up [place] online and to access videos with verbal instruction as well as video instruction as pt continues to have difficulty with performing exercises properly with just pictures/written instructions and frequently going over exercises in therapy session. Pt reports feeling better after performing when done so in therapy.   Exercises Seated Hamstring Stretch - 1-2 x daily - 5 x weekly - 3 sets - 25-30 hold Supine Figure 4 Piriformis Stretch - 1-2 x daily - 5 x weekly - 3 sets - 30 hold Lower Trunk Rotations - 1-2 x daily - 5 x weekly - 2 sets - 10 reps Prone Press Up on Elbows - 1 x daily - 5 x weekly - 1-2 sets - 10 reps - cues for more slowed technique, pt still with tendency to  perform too quickly.  Seated Sciatic Tensioner - 2 x daily - 5 x weekly - 2 sets - 10 reps - reviewed technique as pt reports pain in L lateral calf is better/disappers after performing. Pt continues to need cues to slow down movement and to perform with ankle DF. When shown with video from [place], pt able to perform with proper technique  Standing Lumbar Spine Flexion Stretch Counter - 1 x daily - 5 x weekly - 2 sets - 10 reps Standing Lumbar Extension - 1-2 x daily - 5 x weekly - 2 sets - 10 reps        [facility] Adult PT Treatment/Exercise - 11/16/20 1002      Therapeutic Activites    Therapeutic Activities Other Therapeutic Activities    Other Therapeutic Activities discussed today being last visit in pt's POC and discussed D/C vs. Re-cert, pt reports that he is  noticing improvements  (but slowly) and would like 1-2 more visits to finalize his HEP and make sure he feels comfortable with it. Had discussion with pt that he would need to continue performing HEP consistently after D/C.       Lumbar Exercises: Prone   Other Prone Lumbar Exercises attempted cat/cow after mobilizations to help extension to lumbar spine, however pt with difficulty sequencing despite verbal, tactile, and demo cues.      Knee/Hip Exercises: Supine   Bridges with Clamshell Strengthening;Both;1 set;10 reps   with green tband, cues for full ROM in bridge   Other Supine Knee/Hip Exercises alternating supine marching with green tband around thighs, cues for slowed and controlled x10 reps B      Manual Therapy   Manual Therapy Joint mobilization    Joint Mobilization grade II/III CPA mobilizations to L2-L5, pt with hypomobility. pt reporting feeling relief afterwards.                  PT Education - 11/16/20 1150    Education Details see TA - will re-cert for an additional 1x week for 2 weeks to finalize HEP    Person(s) Educated Patient    Methods Explanation;Demonstration;Handout;Verbal cues    Comprehension Verbalized understanding;Returned demonstration;Verbal cues required;Need further instruction            PT Short Term Goals - 09/19/20 1433      PT SHORT TERM GOAL #1   Title ALL STGS = LTGS             PT Long Term Goals - 11/16/20 0949      PT LONG TERM GOAL #1   Title Pt will be independent with final HEP in order to build upon functional gains made in therapy. ALL LTGS DUE 11/13/20    Baseline pt still needing cues on how to perform correctly, reports performing intermittently at home    Time 4    Period Weeks    Status On-going      PT LONG TERM GOAL #2   Title Pt will decr ODI score to 18% or less in order to demo decr disability related to back pain.    Baseline 20% on 09/20/20, 24% on 10/16/20    Time 4    Period Weeks    Status On-going       PT LONG TERM GOAL #3   Title Pt will report being able to walk to his mailbox and back to his house (approx 70') with 2/10 or less low back pain in order to improve functional mobility.  Baseline pt still reporting pain with walking out to the mailbox, but reports it is getting better and is not as bad as it was.    Time 4    Period Weeks    Status Partially Met      PT LONG TERM GOAL #4   Title Pt will report low back pain at its worst to a 3/10 or less in order to improve functional mobility.    Baseline 6-7/10 pain at its worst, 4/10 on 10/16/20, back pain is approx. a 3/10 on 11/16/20    Time 4    Period Weeks    Status Achieved          Revised/ongoing LTGs for re-cert:     PT Long Term Goals - 11/20/20 0854      PT LONG TERM GOAL #1   Title Pt will be independent with final HEP in order to build upon functional gains made in therapy. ALL LTGS DUE 12/18/20    Baseline pt still needing cues on how to perform correctly, reports performing intermittently at home    Time 4   due to delay in scheduling   Period Weeks    Status On-going    Target Date 12/18/20      PT LONG TERM GOAL #2   Title Pt will decr ODI score to 18% or less in order to demo decr disability related to back pain.    Baseline 20% on 09/20/20, 24% on 10/16/20    Time 4    Period Weeks    Status On-going      PT LONG TERM GOAL #3   Title --    Baseline --    Time --    Period --    Status --      PT LONG TERM GOAL #4   Title --    Baseline --    Time --    Period --    Status --                11/20/20 0846  Plan  Clinical Impression Statement Began to check pt's LTGs this session. Pt has achieved LTG #4 - pt's low back pain has decr to a 3/10 (previously 6-7/10) pain. Pt partially met LTG #3 - pt still with pain walking out to his mailbox with an incline, but reports it is feeling better. Pt still needs cues for how to perform HEP properly (pt still performing too quickly and with  inconsistent technique) despite thorough review in therapy. Showed pt how to access [place] online for video demonstration and verbal instruction for incr compliance at home. Pt reports slow and steady progress with PT and always reports feeling better at end of therapy sessions. Discussed importance of performing exercises and incr movement at home with walking. Will re-cert for an additional 1x week for 2 weeks in order to finalize pt's HEP and make sure pt feels comfortable with performing to continue to work on decr low back pain and radicular sx in order to improve functional mobility.  LTGs revised as appropriate.  Personal Factors and Comorbidities Comorbidity 2;Past/Current Experience;Time since onset of injury/illness/exacerbation  Comorbidities PMH: HTN, Polycythemia rubra vera  Examination-Activity Limitations Locomotion Level;Stairs  Examination-Participation Restrictions Community Activity (playing golf)  Pt will benefit from skilled therapeutic intervention in order to improve on the following deficits Decreased activity tolerance;Decreased range of motion;Decreased mobility;Decreased strength;Difficulty walking;Pain;Improper body mechanics;Impaired sensation;Increased muscle spasms;Hypomobility  Stability/Clinical Decision Making Stable/Uncomplicated  Rehab Potential Good  PT Frequency 1x / week  PT Duration 2 weeks  PT Treatment/Interventions ADLs/Self Care Home Management;Gait training;Stair training;Functional mobility training;Therapeutic activities;Therapeutic exercise;Balance training;Patient/family education;Neuromuscular re-education;Manual techniques;Passive range of motion  PT Next Visit Plan finalize HEP, sciatic nerve glides in sitting/supine, continue to work on hip extensor and ABD strengthening.  Consulted and Agree with Plan of Care Patient      Patient will benefit from skilled therapeutic intervention in order to improve the following deficits and impairments:      Visit Diagnosis: Other disturbances of skin sensation  Muscle weakness (generalized)  Chronic left-sided low back pain with left-sided sciatica     Problem List Patient Active Problem List   Diagnosis Date Noted  . Need for prophylactic vaccination and inoculation against influenza 06/17/2016  . 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 HZ (herpes zoster) 09/02/2011    Arliss Journey, PT, DPT  11/16/2020, 11:53 AM  [facility] [address] [place], [place], [address] Phone: [phone]   Fax:  [phone]  Name: DONI WIDMER MRN: [id number] Date of Birth: [birth year]

## 2020-11-17 DIAGNOSIS — I10 Essential (primary) hypertension: Secondary | ICD-10-CM | POA: Diagnosis not present

## 2020-11-17 DIAGNOSIS — E7800 Pure hypercholesterolemia, unspecified: Secondary | ICD-10-CM | POA: Diagnosis not present

## 2020-11-17 DIAGNOSIS — G4700 Insomnia, unspecified: Secondary | ICD-10-CM | POA: Diagnosis not present

## 2020-11-20 NOTE — Addendum Note (Signed)
Addended by: Arliss Journey on: 11/20/2020 09:04 AM   Modules accepted: Orders

## 2020-11-21 DIAGNOSIS — E7800 Pure hypercholesterolemia, unspecified: Secondary | ICD-10-CM | POA: Diagnosis not present

## 2020-11-21 DIAGNOSIS — G629 Polyneuropathy, unspecified: Secondary | ICD-10-CM | POA: Diagnosis not present

## 2020-11-21 DIAGNOSIS — I10 Essential (primary) hypertension: Secondary | ICD-10-CM | POA: Diagnosis not present

## 2020-11-21 DIAGNOSIS — E871 Hypo-osmolality and hyponatremia: Secondary | ICD-10-CM | POA: Diagnosis not present

## 2020-11-21 DIAGNOSIS — D45 Polycythemia vera: Secondary | ICD-10-CM | POA: Diagnosis not present

## 2020-11-21 DIAGNOSIS — E538 Deficiency of other specified B group vitamins: Secondary | ICD-10-CM | POA: Diagnosis not present

## 2020-11-27 ENCOUNTER — Other Ambulatory Visit: Payer: Self-pay

## 2020-11-27 ENCOUNTER — Inpatient Hospital Stay: Payer: Medicare Other

## 2020-11-27 ENCOUNTER — Inpatient Hospital Stay: Payer: Medicare Other | Admitting: Family

## 2020-11-27 ENCOUNTER — Inpatient Hospital Stay: Payer: Medicare Other | Attending: Family

## 2020-11-27 ENCOUNTER — Encounter: Payer: Self-pay | Admitting: Family

## 2020-11-27 VITALS — BP 135/71 | HR 83 | Temp 98.2°F | Resp 18 | Ht 66.0 in | Wt 156.1 lb

## 2020-11-27 VITALS — BP 130/70

## 2020-11-27 DIAGNOSIS — D500 Iron deficiency anemia secondary to blood loss (chronic): Secondary | ICD-10-CM | POA: Diagnosis not present

## 2020-11-27 DIAGNOSIS — D45 Polycythemia vera: Secondary | ICD-10-CM | POA: Insufficient documentation

## 2020-11-27 DIAGNOSIS — D751 Secondary polycythemia: Secondary | ICD-10-CM

## 2020-11-27 LAB — CMP (CANCER CENTER ONLY)
ALT: 16 U/L (ref 0–44)
AST: 14 U/L — ABNORMAL LOW (ref 15–41)
Albumin: 4.2 g/dL (ref 3.5–5.0)
Alkaline Phosphatase: 56 U/L (ref 38–126)
Anion gap: 6 (ref 5–15)
BUN: 18 mg/dL (ref 8–23)
CO2: 30 mmol/L (ref 22–32)
Calcium: 10.4 mg/dL — ABNORMAL HIGH (ref 8.9–10.3)
Chloride: 99 mmol/L (ref 98–111)
Creatinine: 1.12 mg/dL (ref 0.61–1.24)
GFR, Estimated: >60 mL/min (ref >60)
Glucose, Bld: 97 mg/dL (ref 70–99)
Potassium: 3.8 mmol/L (ref 3.5–5.1)
Sodium: 135 mmol/L (ref 135–145)
Total Bilirubin: 1 mg/dL (ref 0.3–1.2)
Total Protein: 6.8 g/dL (ref 6.5–8.1)

## 2020-11-27 LAB — CBC WITH DIFFERENTIAL (CANCER CENTER ONLY)
Abs Immature Granulocytes: 0.04 10*3/uL (ref 0.00–0.07)
Basophils Absolute: 0 10*3/uL (ref 0.0–0.1)
Basophils Relative: 0 %
Eosinophils Absolute: 0.1 10*3/uL (ref 0.0–0.5)
Eosinophils Relative: 1 %
HCT: 46.9 % (ref 39.0–52.0)
Hemoglobin: 15 g/dL (ref 13.0–17.0)
Immature Granulocytes: 1 %
Lymphocytes Relative: 30 %
Lymphs Abs: 2.3 10*3/uL (ref 0.7–4.0)
MCH: 27.3 pg (ref 26.0–34.0)
MCHC: 32 g/dL (ref 30.0–36.0)
MCV: 85.4 fL (ref 80.0–100.0)
Monocytes Absolute: 1.2 10*3/uL — ABNORMAL HIGH (ref 0.1–1.0)
Monocytes Relative: 15 %
Neutro Abs: 4 10*3/uL (ref 1.7–7.7)
Neutrophils Relative %: 53 %
Platelet Count: 291 10*3/uL (ref 150–400)
RBC: 5.49 MIL/uL (ref 4.22–5.81)
RDW: 16.9 % — ABNORMAL HIGH (ref 11.5–15.5)
WBC Count: 7.5 10*3/uL (ref 4.0–10.5)
nRBC: 0 % (ref 0.0–0.2)

## 2020-11-27 LAB — IRON AND TIBC
Iron: 59 ug/dL (ref 42–163)
Saturation Ratios: 14 % — ABNORMAL LOW (ref 20–55)
TIBC: 413 ug/dL — ABNORMAL HIGH (ref 202–409)
UIBC: 354 ug/dL (ref 117–376)

## 2020-11-27 LAB — FERRITIN: Ferritin: 16 ng/mL — ABNORMAL LOW (ref 24–336)

## 2020-11-27 NOTE — Progress Notes (Signed)
Hematology and Oncology Follow Up Visit  Jesse [name] [id number] 1941-04-23 80 y.o. 11/27/2020   Principle Diagnosis:  Polycythemia vera - JAK2 negative  Current Therapy: Phlebotomy to maintain hematocrit below 45% Aspirin 81 mg by mouth daily   Interim History:  Jesse [name] is here today for follow-up and phlebotomy. He is doing well. He states that he has a pinched nerve causing the pain and sciatica in his left hip and leg and is now in PT. He notes that his symptoms are improving with therapy.  No falls or syncope.  No swelling, numbness or tingling in his extremities at this time.  Pedal pulses are 2+.  He denies fever, chills, n/v, cough, rash, dizziness, SOB, chest pain, palpitations, abdominal pain or changes in bowel or bladder habits.  He has not noted any abnormal bruising, no petechiae.  He has maintained a good appetite and feels that he is hydrating well throughout the day. Weight is stable at 156 lbs.   ECOG Performance Status: 1 - Symptomatic but completely ambulatory  Medications:  Allergies as of 11/27/2020      Reactions   Tramadol Nausea And Vomiting      Medication List       Accurate as of November 27, 2020 10:35 AM. If you have any questions, ask your nurse or doctor.        acetaminophen 325 MG tablet Commonly known as: TYLENOL Take 325 mg by mouth every 6 (six) hours as needed for moderate pain.   ALPRAZolam 1 MG tablet Commonly known as: XANAX Take 0.5 mg by mouth at bedtime.   aspirin EC 81 MG tablet Take 81 mg by mouth daily.   fish oil-omega-3 fatty acids 1000 MG capsule Take 1 g by mouth daily.   gabapentin 100 MG capsule Commonly known as: NEURONTIN Take 100 mg by mouth 3 (three) times daily.   lisinopril-hydrochlorothiazide 20-12.5 MG tablet Commonly known as: ZESTORETIC Take 1 tablet by mouth daily.   ondansetron 4 MG disintegrating tablet Commonly known as: Zofran ODT Take 1 tablet (4 mg total) by mouth every 8 (eight)  hours as needed for up to 15 doses for nausea or vomiting.   simvastatin 20 MG tablet Commonly known as: ZOCOR Take 20 mg by mouth daily at 6 PM.   traMADol 50 MG tablet Commonly known as: ULTRAM Take 50 mg by mouth 2 (two) times daily as needed.   vitamin E 1000 UNIT capsule Take 1,000 Units by mouth daily.       Allergies:  Allergies  Allergen Reactions  . Tramadol Nausea And Vomiting    Past Medical History, Surgical history, Social history, and Family History were reviewed and updated.  Review of Systems: All other 10 point review of systems is negative.   Physical Exam:  height is 5\' 6"  (1.676 m) and weight is 156 lb 1.9 oz (70.8 kg). His oral temperature is 98.2 F (36.8 C). His blood pressure is 135/71 and his pulse is 83. His respiration is 18 and oxygen saturation is 100%.   Wt Readings from Last 3 Encounters:  11/27/20 156 lb 1.9 oz (70.8 kg)  09/29/20 155 lb 6.4 oz (70.5 kg)  07/19/20 155 lb 1.9 oz (70.4 kg)    Ocular: Sclerae unicteric, pupils equal, round and reactive to light Ear-nose-throat: Oropharynx clear, dentition fair Lymphatic: No cervical or supraclavicular adenopathy Lungs no rales or rhonchi, good excursion bilaterally Heart regular rate and rhythm, no murmur appreciated Abd soft, nontender, positive bowel sounds  MSK no focal spinal tenderness, no joint edema Neuro: non-focal, well-oriented, appropriate affect Breasts: Deferred   Lab Results  Component Value Date   WBC 7.5 11/27/2020   HGB 15.0 11/27/2020   HCT 46.9 11/27/2020   MCV 85.4 11/27/2020   PLT 291 11/27/2020   Lab Results  Component Value Date   FERRITIN 15 (L) 09/29/2020   IRON 55 09/29/2020   TIBC 378 09/29/2020   UIBC 322 09/29/2020   IRONPCTSAT 15 (L) 09/29/2020   Lab Results  Component Value Date   RETICCTPCT 1.5 01/11/2020   RBC 5.49 11/27/2020   RETICCTABS 87.5 01/02/2011   No results found for: KPAFRELGTCHN, LAMBDASER, KAPLAMBRATIO No results found for:  Kandis Cocking, IGMSERUM No results found for: Odetta Pink, SPEI   Chemistry      Component Value Date/Time   NA 135 11/27/2020 0917   NA 136 08/19/2017 1149   NA 135 (L) 10/21/2016 1151   K 3.8 11/27/2020 0917   K 3.8 08/19/2017 1149   K 3.9 10/21/2016 1151   CL 99 11/27/2020 0917   CL 95 (L) 08/19/2017 1149   CO2 30 11/27/2020 0917   CO2 28 08/19/2017 1149   CO2 25 10/21/2016 1151   BUN 18 11/27/2020 0917   BUN 14 08/19/2017 1149   BUN 13.6 10/21/2016 1151   CREATININE 1.12 11/27/2020 0917   CREATININE 1.0 08/19/2017 1149   CREATININE 0.8 10/21/2016 1151      Component Value Date/Time   CALCIUM 10.4 (H) 11/27/2020 0917   CALCIUM 10.1 08/19/2017 1149   CALCIUM 10.3 10/21/2016 1151   ALKPHOS 56 11/27/2020 0917   ALKPHOS 68 08/19/2017 1149   ALKPHOS 68 10/21/2016 1151   AST 14 (L) 11/27/2020 0917   AST 25 10/21/2016 1151   ALT 16 11/27/2020 0917   ALT 32 08/19/2017 1149   ALT 38 10/21/2016 1151   BILITOT 1.0 11/27/2020 0917   BILITOT 1.22 (H) 10/21/2016 1151       Impression and Plan: Jesse [name] is a very pleasant 80yo caucasian gentleman with polycythemia, JAK-2 negative. We did proceed with phlebotomy for Hct 46.9%.  Follow-up in 8 weeks.  He can contact our office with any questions or concerns.   Laverna Peace, NP 3/7/202210:35 AM

## 2020-11-27 NOTE — Patient Instructions (Signed)
Therapeutic Phlebotomy Therapeutic phlebotomy is the planned removal of blood from a person's body for the purpose of treating a medical condition. The procedure is similar to donating blood. Usually, about a pint (470 mL, or 0.47 L) of blood is removed. The average adult has 9-12 pints (4.3-5.7 L) of blood in the body. Therapeutic phlebotomy may be used to treat the following medical conditions:  Hemochromatosis. This is a condition in which the blood contains too much iron.  Polycythemia vera. This is a condition in which the blood contains too many red blood cells.  Porphyria cutanea tarda. This is a disease in which an important part of hemoglobin is not made properly. It results in the buildup of abnormal amounts of porphyrins in the body.  Sickle cell disease. This is a condition in which the red blood cells form an abnormal crescent shape rather than a round shape. Tell a health care provider about:  Any allergies you have.  All medicines you are taking, including vitamins, herbs, eye drops, creams, and over-the-counter medicines.  Any problems you or family members have had with anesthetic medicines.  Any blood disorders you have.  Any surgeries you have had.  Any medical conditions you have.  Whether you are pregnant or may be pregnant. What are the risks? Generally, this is a safe procedure. However, problems may occur, including:  Nausea or light-headedness.  Low blood pressure (hypotension).  Soreness, bleeding, swelling, or bruising at the needle insertion site.  Infection. What happens before the procedure?  Follow instructions from your health care provider about eating or drinking restrictions.  Ask your health care provider about: ? Changing or stopping your regular medicines. This is especially important if you are taking diabetes medicines or blood thinners (anticoagulants). ? Taking medicines such as aspirin and ibuprofen. These medicines can thin your  blood. Do not take these medicines unless your health care provider tells you to take them. ? Taking over-the-counter medicines, vitamins, herbs, and supplements.  Wear clothing with sleeves that can be raised above the elbow.  Plan to have someone take you home from the hospital or clinic.  You may have a blood sample taken.  Your blood pressure, pulse rate, and breathing rate will be measured. What happens during the procedure?  To lower your risk of infection: ? Your health care team will wash or sanitize their hands. ? Your skin will be cleaned with an antiseptic.  You may be given a medicine to numb the area (local anesthetic).  A tourniquet will be placed on your arm.  A needle will be inserted into one of your veins.  Tubing and a collection bag will be attached to that needle.  Blood will flow through the needle and tubing into the collection bag.  The collection bag will be placed lower than your arm to allow gravity to help the flow of blood into the bag.  You may be asked to open and close your hand slowly and continually during the entire collection.  After the specified amount of blood has been removed from your body, the collection bag and tubing will be clamped.  The needle will be removed from your vein.  Pressure will be held on the site of the needle insertion to stop the bleeding.  A bandage (dressing) will be placed over the needle insertion site. The procedure may vary among health care providers and hospitals.   What happens after the procedure?  Your blood pressure, pulse rate, and breathing rate will   be measured after the procedure.  You will be encouraged to drink fluids.  Your recovery will be assessed and monitored.  You can return to your normal activities as told by your health care provider. Summary  Therapeutic phlebotomy is the planned removal of blood from a person's body for the purpose of treating a medical condition.  Therapeutic  phlebotomy may be used to treat hemochromatosis, polycythemia vera, porphyria cutanea tarda, or sickle cell disease.  In the procedure, a needle is inserted and about a pint (470 mL, or 0.47 L) of blood is removed. The average adult has 9-12 pints (4.3-5.7 L) of blood in the body.  This is generally a safe procedure, but it can sometimes cause problems such as nausea, light-headedness, or low blood pressure (hypotension). This information is not intended to replace advice given to you by your health care provider. Make sure you discuss any questions you have with your health care provider. Document Revised: 09/25/2017 Document Reviewed: 09/25/2017 Elsevier Patient Education  2021 Elsevier Inc.  

## 2020-11-27 NOTE — Progress Notes (Signed)
Jesse [name] presents today for phlebotomy per MD orders. Phlebotomy procedure started at 1030 per [facility] Patient observed for 30 minutes after procedure without any incident. Patient tolerated procedure well. IV needle removed intact.

## 2020-11-28 ENCOUNTER — Telehealth: Payer: Self-pay | Admitting: Hematology & Oncology

## 2020-11-28 NOTE — Telephone Encounter (Signed)
Appointments scheduled per 3/7 los letter/calendar mailed  

## 2020-11-29 ENCOUNTER — Other Ambulatory Visit (HOSPITAL_COMMUNITY): Payer: Self-pay | Admitting: Internal Medicine

## 2020-11-29 ENCOUNTER — Ambulatory Visit: Payer: Medicare Other | Attending: Internal Medicine

## 2020-11-29 DIAGNOSIS — Z23 Encounter for immunization: Secondary | ICD-10-CM

## 2020-11-29 NOTE — Progress Notes (Signed)
   Covid-19 Vaccination Clinic  Name:  Jesse [name]    MRN: [id number] DOB: 1941/08/07  11/29/2020  [name] was observed post Covid-19 immunization for 15 minutes without incident. He was provided with Vaccine Information Sheet and instruction to access the V-Safe system.   [name] was instructed to call 911 with any severe reactions post vaccine: Marland Kitchen Difficulty breathing  . Swelling of face and throat  . A fast heartbeat  . A bad rash all over body  . Dizziness and weakness   Immunizations Administered    Name Date Dose VIS Date Route   PFIZER Comrnaty(Gray TOP) Covid-19 Vaccine 11/29/2020  9:43 AM 0.3 mL 08/31/2020 Intramuscular   Manufacturer: [place]   Lot: [id number]   NDC: [phone]

## 2020-12-03 ENCOUNTER — Encounter (HOSPITAL_COMMUNITY): Payer: Self-pay | Admitting: Emergency Medicine

## 2020-12-03 ENCOUNTER — Other Ambulatory Visit: Payer: Self-pay

## 2020-12-03 ENCOUNTER — Emergency Department (HOSPITAL_COMMUNITY)
Admission: EM | Admit: 2020-12-03 | Discharge: 2020-12-03 | Disposition: A | Discharge status: Home / Self Care | Payer: Medicare Other | Attending: Emergency Medicine | Admitting: Emergency Medicine

## 2020-12-03 ENCOUNTER — Emergency Department (HOSPITAL_COMMUNITY): Payer: Medicare Other

## 2020-12-03 DIAGNOSIS — I10 Essential (primary) hypertension: Secondary | ICD-10-CM | POA: Insufficient documentation

## 2020-12-03 DIAGNOSIS — N1330 Unspecified hydronephrosis: Secondary | ICD-10-CM | POA: Insufficient documentation

## 2020-12-03 DIAGNOSIS — Z79899 Other long term (current) drug therapy: Secondary | ICD-10-CM | POA: Diagnosis not present

## 2020-12-03 DIAGNOSIS — Z7982 Long term (current) use of aspirin: Secondary | ICD-10-CM | POA: Insufficient documentation

## 2020-12-03 DIAGNOSIS — N281 Cyst of kidney, acquired: Secondary | ICD-10-CM | POA: Diagnosis not present

## 2020-12-03 DIAGNOSIS — R1032 Left lower quadrant pain: Secondary | ICD-10-CM | POA: Diagnosis not present

## 2020-12-03 DIAGNOSIS — N3289 Other specified disorders of bladder: Secondary | ICD-10-CM | POA: Diagnosis not present

## 2020-12-03 DIAGNOSIS — K5730 Diverticulosis of large intestine without perforation or abscess without bleeding: Secondary | ICD-10-CM | POA: Diagnosis not present

## 2020-12-03 LAB — URINALYSIS, ROUTINE W REFLEX MICROSCOPIC
Bilirubin Urine: NEGATIVE
Glucose, UA: NEGATIVE mg/dL
Hgb urine dipstick: NEGATIVE
Ketones, ur: NEGATIVE mg/dL
Leukocytes,Ua: NEGATIVE
Nitrite: NEGATIVE
Protein, ur: NEGATIVE mg/dL
Specific Gravity, Urine: 1.024 (ref 1.005–1.030)
pH: 6 (ref 5.0–8.0)

## 2020-12-03 LAB — COMPREHENSIVE METABOLIC PANEL
ALT: 21 U/L (ref 0–44)
AST: 18 U/L (ref 15–41)
Albumin: 3.6 g/dL (ref 3.5–5.0)
Alkaline Phosphatase: 55 U/L (ref 38–126)
Anion gap: 9 (ref 5–15)
BUN: 18 mg/dL (ref 8–23)
CO2: 24 mmol/L (ref 22–32)
Calcium: 9.7 mg/dL (ref 8.9–10.3)
Chloride: 99 mmol/L (ref 98–111)
Creatinine, Ser: 0.99 mg/dL (ref 0.61–1.24)
GFR, Estimated: >60 mL/min (ref >60)
Glucose, Bld: 136 mg/dL — ABNORMAL HIGH (ref 70–99)
Potassium: 3.7 mmol/L (ref 3.5–5.1)
Sodium: 132 mmol/L — ABNORMAL LOW (ref 135–145)
Total Bilirubin: 0.6 mg/dL (ref 0.3–1.2)
Total Protein: 6.5 g/dL (ref 6.5–8.1)

## 2020-12-03 LAB — CBC WITH DIFFERENTIAL/PLATELET
Abs Immature Granulocytes: 0.03 10*3/uL (ref 0.00–0.07)
Basophils Absolute: 0 10*3/uL (ref 0.0–0.1)
Basophils Relative: 1 %
Eosinophils Absolute: 0.1 10*3/uL (ref 0.0–0.5)
Eosinophils Relative: 1 %
HCT: 44 % (ref 39.0–52.0)
Hemoglobin: 14 g/dL (ref 13.0–17.0)
Immature Granulocytes: 0 %
Lymphocytes Relative: 26 %
Lymphs Abs: 2.2 10*3/uL (ref 0.7–4.0)
MCH: 27.6 pg (ref 26.0–34.0)
MCHC: 31.8 g/dL (ref 30.0–36.0)
MCV: 86.6 fL (ref 80.0–100.0)
Monocytes Absolute: 1.1 10*3/uL — ABNORMAL HIGH (ref 0.1–1.0)
Monocytes Relative: 13 %
Neutro Abs: 4.9 10*3/uL (ref 1.7–7.7)
Neutrophils Relative %: 59 %
Platelets: 351 10*3/uL (ref 150–400)
RBC: 5.08 MIL/uL (ref 4.22–5.81)
RDW: 16.7 % — ABNORMAL HIGH (ref 11.5–15.5)
WBC: 8.3 10*3/uL (ref 4.0–10.5)
nRBC: 0 % (ref 0.0–0.2)

## 2020-12-03 LAB — LIPASE, BLOOD: Lipase: 33 U/L (ref 11–51)

## 2020-12-03 LAB — CBC

## 2020-12-03 MED ORDER — ONDANSETRON HCL 4 MG/2ML IJ SOLN
4.0000 mg | Freq: Once | INTRAMUSCULAR | Status: AC
  Administered 2020-12-03: 4 mg via INTRAVENOUS
  Filled 2020-12-03: qty 2

## 2020-12-03 MED ORDER — SODIUM CHLORIDE 0.9 % IV BOLUS
1000.0000 mL | Freq: Once | INTRAVENOUS | Status: AC
  Administered 2020-12-03: 1000 mL via INTRAVENOUS

## 2020-12-03 MED ORDER — MORPHINE SULFATE (PF) 2 MG/ML IV SOLN
2.0000 mg | Freq: Once | INTRAVENOUS | Status: AC
  Administered 2020-12-03: 2 mg via INTRAVENOUS
  Filled 2020-12-03: qty 1

## 2020-12-03 MED ORDER — CEPHALEXIN 500 MG PO CAPS: 500.0000 mg | ORAL_CAPSULE | Freq: Three times a day (TID) | ORAL | 0 refills | Status: AC

## 2020-12-03 MED ORDER — IOHEXOL 300 MG/ML  SOLN
100.0000 mL | Freq: Once | INTRAMUSCULAR | Status: AC | PRN
  Administered 2020-12-03: 100 mL via INTRAVENOUS

## 2020-12-03 MED ORDER — MORPHINE SULFATE (PF) 4 MG/ML IV SOLN: 4.0000 mg | Freq: Once | INTRAVENOUS | Status: DC

## 2020-12-03 NOTE — ED Notes (Signed)
Patient given discharge paperwork, prescriptions, and instructions. Verbalized understanding of teaching. IV d/c with cath tip intact. Ambulatory to exit in NAD with steady gait.

## 2020-12-03 NOTE — Discharge Instructions (Addendum)
Recommend following up with urology on this matter.  Call to make an appointment. We have included a copy of the CT results from today detailing each of the abnormalities that require follow-up.  Please take all of your antibiotics until finished!   You may develop abdominal discomfort or diarrhea from the antibiotic.  You may help offset this with probiotics which you can buy or get in yogurt. Do not eat or take the probiotics until 2 hours after your antibiotic.   Return to the emergency department for recurrent and worsening abdominal pain, persistent vomiting, difficulty urinating, pain with urination, dizziness, passing out, fever with your pain, or any other major concerns.

## 2020-12-03 NOTE — ED Triage Notes (Signed)
LLQ pain x 1-2 hours.  Denies nausea, vomiting, diarrhea, or urinary complaint.

## 2020-12-03 NOTE — ED Notes (Signed)
Patient transported to CT 

## 2020-12-03 NOTE — ED Provider Notes (Signed)
[facility] EMERGENCY DEPARTMENT Provider Note   CSN: [id number] Arrival date & time: 12/03/20  1727     History Chief Complaint  Patient presents with  . Abdominal Pain    Jesse [name] is a 80 y.o. male.  HPI      Jesse [name] is a 80 y.o. male, with a history of hypercholesterolemia, HTN, neuritis of the left foot, presenting to the ED with abdominal pain beginning around 3 PM today. Pain is in the left lower quadrant, constant, "it just hurts," 8/10.  He sometimes also feels pain in the left lower back.  He has not had this pain before.  He states he has lower back and left lower leg pain at baseline due to a "nerve problem."  However, he denies any acute numbness, acute leg pain, weakness. Last bowel movement was earlier today and was normal. Denies fever/chills, N/V/D, dysuria, hematuria, difficulty urinating, hematochezia/melena, dizziness, syncope, groin pain/swelling, saddle anesthesias, or any other complaints.  Past Medical History:  Diagnosis Date  . Depression   . Hypercholesteremia   . Hypertension   . HZ (herpes zoster) 09/02/2011  . Neuritis    left foot  . Polycythemia rubra vera ([place]) 09/02/2011    Patient Active Problem List   Diagnosis Date Noted  . Need for prophylactic vaccination and inoculation against influenza 06/17/2016  . Polycythemia rubra vera ([place]) 09/02/2011  . HZ (herpes zoster) 09/02/2011    Past Surgical History:  Procedure Laterality Date  . GAS INSERTION  06/04/2012   Procedure: INSERTION OF GAS;  Surgeon: Hayden Pedro, MD;  Location: [facility];  Service: Ophthalmology;  Laterality: Left;  C3F8  . SCLERAL BUCKLE  06/04/2012   Procedure: SCLERAL BUCKLE;  Surgeon: Hayden Pedro, MD;  Location: [facility];  Service: Ophthalmology;  Laterality: Left;  Headscope laser       Family History  Problem Relation Age of Onset  . Heart attack Mother     Social History   Tobacco Use  . Smoking status: Never Smoker  .  Smokeless tobacco: Never Used  . Tobacco comment: never used tobacco  Vaping Use  . Vaping Use: Never used  Substance Use Topics  . Alcohol use: No    Alcohol/week: 0.0 standard drinks  . Drug use: No    Home Medications Prior to Admission medications   Medication Sig Start Date End Date Taking? Authorizing Provider  cephALEXin (KEFLEX) 500 MG capsule Take 1 capsule (500 mg total) by mouth 3 (three) times daily for 5 days. 12/03/20 12/08/20 Yes Joy, Shawn C, PA-C  acetaminophen (TYLENOL) 325 MG tablet Take 325 mg by mouth every 6 (six) hours as needed for moderate pain.     [provider]  ALPRAZolam [name]) 1 MG tablet Take 0.5 mg by mouth at bedtime.  07/31/18   [provider]  aspirin EC 81 MG tablet Take 81 mg by mouth daily.    [provider]  fish oil-omega-3 fatty acids 1000 MG capsule Take 1 g by mouth daily.    [provider]  gabapentin (NEURONTIN) 100 MG capsule Take 100 mg by mouth 3 (three) times daily. 03/17/20   [provider]  lisinopril-hydrochlorothiazide (PRINZIDE,ZESTORETIC) 20-12.5 MG per tablet Take 1 tablet by mouth daily.    [provider]  ondansetron (ZOFRAN ODT) 4 MG disintegrating tablet Take 1 tablet (4 mg total) by mouth every 8 (eight) hours as needed for up to 15 doses for nausea or vomiting.  02/28/20   [name], MD  simvastatin (ZOCOR) 20 MG tablet Take 20 mg by mouth daily at 6 PM.  02/20/19   [provider]  traMADol (ULTRAM) 50 MG tablet Take 50 mg by mouth 2 (two) times daily as needed. 02/27/20   [provider]  vitamin E 1000 UNIT capsule Take 1,000 Units by mouth daily. 11/18/19   [provider]    Allergies    Tramadol  Review of Systems   Review of Systems  Constitutional: Negative for chills, diaphoresis and fever.  Respiratory: Negative for shortness of breath.   Cardiovascular: Negative for chest pain.  Gastrointestinal: Positive for abdominal pain.  Negative for diarrhea, nausea and vomiting.  Genitourinary: Negative for decreased urine volume, difficulty urinating, dysuria, frequency, hematuria, scrotal swelling and testicular pain.  Neurological: Negative for syncope, weakness and numbness.  All other systems reviewed and are negative.   Physical Exam Updated Vital Signs BP (!) 153/90 (BP Location: Right Arm)   Pulse 93   Temp 97.9 F (36.6 C)   Resp 18   SpO2 99%   Physical Exam Vitals and nursing note reviewed.  Constitutional:      General: He is not in acute distress.    Appearance: He is well-developed. He is not diaphoretic.  HENT:     Head: Normocephalic and atraumatic.     Mouth/Throat:     Mouth: Mucous membranes are moist.     Pharynx: Oropharynx is clear.  Eyes:     Conjunctiva/sclera: Conjunctivae normal.  Cardiovascular:     Rate and Rhythm: Normal rate and regular rhythm.     Pulses: Normal pulses.          Radial pulses are 2+ on the right side and 2+ on the left side.       Posterior tibial pulses are 2+ on the right side and 2+ on the left side.     Heart sounds: Normal heart sounds.     Comments: Tactile temperature in the extremities appropriate and equal bilaterally. Pulmonary:     Effort: Pulmonary effort is normal. No respiratory distress.     Breath sounds: Normal breath sounds.  Abdominal:     Palpations: Abdomen is soft.     Tenderness: There is abdominal tenderness in the left lower quadrant. There is no right CVA tenderness, left CVA tenderness or guarding.    Musculoskeletal:     Cervical back: Neck supple.     Right lower leg: No edema.     Left lower leg: No edema.  Lymphadenopathy:     Cervical: No cervical adenopathy.  Skin:    General: Skin is warm and dry.  Neurological:     Mental Status: He is alert.     Comments: Sensation light touch grossly intact in the bilateral lower extremities. Strength 5/5 in the bilateral lower extremities.  Psychiatric:        Mood and  Affect: Mood and affect normal.        Speech: Speech normal.        Behavior: Behavior normal.     ED Results / Procedures / Treatments   Labs (all labs ordered are listed, but only abnormal results are displayed) Labs Reviewed  COMPREHENSIVE METABOLIC PANEL - Abnormal; Notable for the following components:      Result Value   Sodium 132 (*)    Glucose, Bld 136 (*)    All other components within normal limits  CBC WITH DIFFERENTIAL/PLATELET - Abnormal;  Notable for the following components:   RDW 16.7 (*)    Monocytes Absolute 1.1 (*)    All other components within normal limits  URINE CULTURE  LIPASE, BLOOD  CBC  URINALYSIS, ROUTINE W REFLEX MICROSCOPIC    EKG None  Radiology CT ABDOMEN PELVIS W CONTRAST  Result Date: 12/03/2020 CLINICAL DATA:  Left lower quadrant pain for 2 hours EXAM: CT ABDOMEN AND PELVIS WITH CONTRAST TECHNIQUE: Multidetector CT imaging of the abdomen and pelvis was performed using the standard protocol following bolus administration of intravenous contrast. CONTRAST:  135mL OMNIPAQUE IOHEXOL 300 MG/ML  SOLN COMPARISON:  None. FINDINGS: Lower chest: Coronary artery calcification.  Lung bases are clear. Hepatobiliary: No focal liver abnormality is seen. No gallstones, gallbladder wall thickening, or biliary dilatation. Pancreas: Unremarkable. No pancreatic ductal dilatation or surrounding inflammatory changes. Spleen: Normal in size without focal abnormality. Adrenals/Urinary Tract: Unremarkable adrenal glands. Moderate left hydronephrosis with perinephric stranding. No obstructing Henton identified. There is a large left renal sinus cyst measuring 6.3 x 4.5 x 4.6 cm. Two additional simple left renal cysts. Left ureter is nondilated. Right kidney is within normal limits without Orwick or hydronephrosis. Urinary bladder is moderately distended but appears otherwise unremarkable. No bladder calculi. Stomach/Bowel: Stomach is within normal limits. Appendix appears  normal. Minimal colonic diverticulosis. No evidence of bowel wall thickening, distention, or inflammatory changes. Vascular/Lymphatic: Scattered aortoiliac atherosclerotic calcifications without aneurysm. No abdominopelvic lymphadenopathy. Reproductive: Prostatomegaly. Other: No free fluid. No abdominopelvic fluid collection. No pneumoperitoneum. No abdominal wall hernia. Musculoskeletal: Multilevel lumbar spondylosis. No acute osseous findings. No suspicious bone lesion. IMPRESSION: 1. Moderate left hydronephrosis with perinephric stranding. No obstructing Patteson identified. 2. Large left renal sinus cyst measuring up to 6.3 cm. This could potentially be causing compression of the pelvicaliceal system resulting in hydronephrosis. 3. Prostatomegaly. 4. Urinary bladder is moderately distended which may reflect a component of bladder outlet obstruction. 5. Minimal colonic diverticulosis without evidence of acute diverticulitis. 6. Aortic atherosclerosis (ICD10-I70.0). Electronically Signed   By: Davina Poke D.O.   On: 12/03/2020 19:47    Procedures Procedures   Medications Ordered in ED Medications  ondansetron (ZOFRAN) injection 4 mg (4 mg Intravenous Given 12/03/20 1823)  sodium chloride 0.9 % bolus 1,000 mL (0 mLs Intravenous Stopped 12/03/20 2006)  morphine 2 MG/ML injection 2 mg (2 mg Intravenous Given 12/03/20 1823)  iohexol (OMNIPAQUE) 300 MG/ML solution 100 mL (100 mLs Intravenous Contrast Given 12/03/20 1903)    ED Course  I have reviewed the triage vital signs and the nursing notes.  Pertinent labs & imaging results that were available during my care of the patient were reviewed by me and considered in my medical decision making (see chart for details).  Clinical Course as of 12/03/20 2233  Nancy Fetter Dec 03, 2020  1803 Patient endorses resolution in his pain. [SJ]    Clinical Course User Index [SJ] Joy, Helane Gunther, PA-C   MDM Rules/Calculators/A&P                          Patient presents  with left lower abdominal pain. Patient is nontoxic appearing, afebrile, not tachycardic, not tachypneic, not hypotensive, maintains excellent SPO2 on room air, and is in no apparent distress.   I have reviewed the patient's chart to obtain more information.   I reviewed and interpreted the patient's labs and radiological studies. No leukocytosis.  Patient's pain resolved early in the course and did not recur.  Able to urinate  here in the ED. CT with left hydronephrosis without evidence of renal Cullars.  Renal cyst also noted.  Due to the hydronephrosis and no finding of Kinkade, we will start the patient on antibiotic.  He will follow-up with urology on this matter.   Findings and plan of care discussed with attending physician, Varney Biles, MD. Dr. Kathrynn Humble personally evaluated and examined this patient.  Final Clinical Impression(s) / ED Diagnoses Final diagnoses:  Left lower quadrant abdominal pain  Hydronephrosis, left  Renal cyst, left    Rx / DC Orders ED Discharge Orders         Ordered    cephALEXin (KEFLEX) 500 MG capsule  3 times daily        12/03/20 2056           Layla Maw 12/03/20 2235    Varney Biles, MD 12/08/20 1637

## 2020-12-04 DIAGNOSIS — I700 Atherosclerosis of aorta: Secondary | ICD-10-CM | POA: Diagnosis not present

## 2020-12-04 DIAGNOSIS — Q6100 Congenital renal cyst, unspecified: Secondary | ICD-10-CM | POA: Diagnosis not present

## 2020-12-04 DIAGNOSIS — N1330 Unspecified hydronephrosis: Secondary | ICD-10-CM | POA: Diagnosis not present

## 2020-12-04 DIAGNOSIS — K5790 Diverticulosis of intestine, part unspecified, without perforation or abscess without bleeding: Secondary | ICD-10-CM | POA: Diagnosis not present

## 2020-12-05 DIAGNOSIS — I10 Essential (primary) hypertension: Secondary | ICD-10-CM | POA: Diagnosis not present

## 2020-12-05 DIAGNOSIS — G4700 Insomnia, unspecified: Secondary | ICD-10-CM | POA: Diagnosis not present

## 2020-12-05 DIAGNOSIS — E7800 Pure hypercholesterolemia, unspecified: Secondary | ICD-10-CM | POA: Diagnosis not present

## 2020-12-05 LAB — URINE CULTURE: Culture: NO GROWTH

## 2020-12-07 ENCOUNTER — Other Ambulatory Visit: Payer: Self-pay

## 2020-12-07 ENCOUNTER — Encounter: Payer: Self-pay | Admitting: Physical Therapy

## 2020-12-07 ENCOUNTER — Ambulatory Visit: Payer: Medicare Other | Attending: Diagnostic Neuroimaging | Admitting: Physical Therapy

## 2020-12-07 DIAGNOSIS — M6281 Muscle weakness (generalized): Secondary | ICD-10-CM | POA: Diagnosis not present

## 2020-12-07 DIAGNOSIS — G8929 Other chronic pain: Secondary | ICD-10-CM

## 2020-12-07 DIAGNOSIS — R208 Other disturbances of skin sensation: Secondary | ICD-10-CM

## 2020-12-07 DIAGNOSIS — M5442 Lumbago with sciatica, left side: Secondary | ICD-10-CM | POA: Insufficient documentation

## 2020-12-07 NOTE — Therapy (Signed)
[facility] [address] [place], [place], [address] Phone: [phone]   Fax:  [phone]  Physical Therapy Treatment  Patient Details  Name: Jesse [name] MRN: [id number] Date of Birth: [birth year] Referring Provider (PT): Andrey Spearman, MD   Encounter Date: 12/07/2020   PT End of Session - 12/07/20 1102    Visit Number 13    Number of Visits 14    Date for PT Re-Evaluation 09/32/67   re-cert on 10/16/56   Authorization Type [employer] Medicare    PT Start Time 0998    PT Stop Time 1058    PT Time Calculation (min) 43 min    Activity Tolerance Patient tolerated treatment well    Behavior During Therapy [facility] for tasks assessed/performed           Past Medical History:  Diagnosis Date  . Depression   . Hypercholesteremia   . Hypertension   . HZ (herpes zoster) 09/02/2011  . Neuritis    left foot  . Polycythemia rubra vera ([place]) 09/02/2011    Past Surgical History:  Procedure Laterality Date  . GAS INSERTION  06/04/2012   Procedure: INSERTION OF GAS;  Surgeon: Hayden Pedro, MD;  Location: [facility];  Service: Ophthalmology;  Laterality: Left;  C3F8  . SCLERAL BUCKLE  06/04/2012   Procedure: SCLERAL BUCKLE;  Surgeon: Hayden Pedro, MD;  Location: [facility];  Service: Ophthalmology;  Laterality: Left;  Headscope laser    There were no vitals filed for this visit.   Subjective Assessment - 12/07/20 1018    Subjective Went to the ER the other night for L lower abdominal pain, started him on an antibiotic. Doing much better. Reports pain is getting some better and that exercises are helping a little bit.    Pertinent History PMH: HTN    Limitations Walking    Diagnostic tests MRI from 10/24: 2.    At L4-L5, degenerative changes cause moderately severe left foraminal narrowing and severe left lateral recess stenosis with potential for left L4 and left L5 nerve root compression.  3.    At L5-S1, degenerative changes  cause moderately severe right lateral recess stenosis with potential for right S1 nerve root compression.    Patient Stated Goals wants to get rid of the pain.    Currently in Pain? No/denies                        [facility] Adult PT Treatment/Exercise - 12/07/20 1027      Exercises   Exercises Other Exercises    Other Exercises  reviewed seated sciatic nerve glide with slumped posture: 2x 10 reps, pt continues to need cues for proper technique despite frequent review and video demonstration for HEP use (pt performs very quickly and without ankle DF without slumped posture). pt reports pain in LLE feels better afterwards, continued to discuss that this is one of the most important exercises to perform at home due to pt reporting relief in radicular pain. also performed seated sciatic nerve glide x10 reps with slumped/upright posture - therapist needing to move pt's leg for knee ext/flex as pt can't coordinate movement on his own with slumped/upright posture.      Lumbar Exercises: Stretches   Other Lumbar Stretch Exercise performed modified child's pose at the countertop with pt bringing LUE to R for incr L sided stretch, pt with difficulty following directions for needing to hold a stretch. then performed child's  pose with this modification on the mat, pt with no change in sx. performed supine lower trunk rotations, pt hurriedly performs, pt needs cues to SLOW down and hold each side for approx. 10-15 seconds for further stretch, x8 reps B. pt reporting feeling relief in L low back when performing correctly.      Lumbar Exercises: Aerobic   Nustep with BLE and BUE for strengthening, activity tolerance, and ROM gear 4.0 for 6 minutes. cues for rpm at 50      Lumbar Exercises: Supine   Bridge with clamshell 10 reps    Bridge with [employer] Limitations with green tband, cues for full ROM    Other Supine Lumbar Exercises supine hooklying bent knee fall outs with green tband x10 reps B,  cues for slowed and controlled      Manual Therapy   Manual Therapy Soft tissue mobilization    Soft tissue mobilization to L lower lumbar paraspinals/quadratus lumborum, pt unable to tolerate more deep pressure at times. attempted showing pt self mobilization to perform at home with tennis ball (either in supine or standing), pt reported it did not feel good despite cues to perform slowly and gently as pt with tendency to perform with putting too much pressure on ball.                    PT Short Term Goals - 09/19/20 1433      PT SHORT TERM GOAL #1   Title ALL STGS = LTGS             PT Long Term Goals - 11/20/20 0854      PT LONG TERM GOAL #1   Title Pt will be independent with final HEP in order to build upon functional gains made in therapy. ALL LTGS DUE 12/18/20    Baseline pt still needing cues on how to perform correctly, reports performing intermittently at home    Time 4   due to delay in scheduling   Period Weeks    Status On-going    Target Date 12/18/20      PT LONG TERM GOAL #2   Title Pt will decr ODI score to 18% or less in order to demo decr disability related to back pain.    Baseline 20% on 09/20/20, 24% on 10/16/20    Time 4    Period Weeks    Status On-going      PT LONG TERM GOAL #3   Title --    Baseline --    Time --    Period --    Status --      PT LONG TERM GOAL #4   Title --    Baseline --    Time --    Period --    Status --                 Plan - 12/07/20 1150    Clinical Impression Statement Today's skilled session focused on hip ext/ABD strengthening, soft tissue mobilization to lumbar spine, nerve glides, and lumbar ROM. Pt tolerated session well, does still need cues for proper technique and slowing down exercies (esp nerve glides and lower trunk rotations). Pt reports relief from both of these exercises when performed correctly. Will continue to progress towards LTGs.    Personal Factors and Comorbidities  Comorbidity 2;Past/Current Experience;Time since onset of injury/illness/exacerbation    Comorbidities PMH: HTN, Polycythemia rubra vera    Examination-Activity Limitations Locomotion Level;Stairs  Examination-Participation Restrictions Community Activity   playing golf   Stability/Clinical Decision Making Stable/Uncomplicated    Rehab Potential Good    PT Frequency 1x / week    PT Duration 2 weeks    PT Treatment/Interventions ADLs/Self Care Home Management;Gait training;Stair training;Functional mobility training;Therapeutic activities;Therapeutic exercise;Balance training;Patient/family education;Neuromuscular re-education;Manual techniques;Passive range of motion    PT Next Visit Plan how is HEP? mobilizations to lumbar spine. continue to work on hip extensor and ABD strengthening.    Consulted and Agree with Plan of Care Patient           Patient will benefit from skilled therapeutic intervention in order to improve the following deficits and impairments:  Decreased activity tolerance,Decreased range of motion,Decreased mobility,Decreased strength,Difficulty walking,Pain,Improper body mechanics,Impaired sensation,Increased muscle spasms,Hypomobility  Visit Diagnosis: Other disturbances of skin sensation  Muscle weakness (generalized)  Chronic left-sided low back pain with left-sided sciatica     Problem List Patient Active Problem List   Diagnosis Date Noted  . Need for prophylactic vaccination and inoculation against influenza 06/17/2016  . Polycythemia rubra vera ([place]) 09/02/2011  . HZ (herpes zoster) 09/02/2011    Arliss Journey, PT, DPT  12/07/2020, 12:05 PM  [facility] [address] [place], [place], [address] Phone: [phone]   Fax:  [phone]  Name: DAGOBERTO NEALY MRN: [id number] Date of Birth: 08-Aug-1941

## 2020-12-14 ENCOUNTER — Other Ambulatory Visit: Payer: Self-pay

## 2020-12-14 ENCOUNTER — Ambulatory Visit: Payer: Medicare Other | Admitting: Physical Therapy

## 2020-12-14 DIAGNOSIS — M6281 Muscle weakness (generalized): Secondary | ICD-10-CM | POA: Diagnosis not present

## 2020-12-14 DIAGNOSIS — G8929 Other chronic pain: Secondary | ICD-10-CM

## 2020-12-14 DIAGNOSIS — R208 Other disturbances of skin sensation: Secondary | ICD-10-CM | POA: Diagnosis not present

## 2020-12-14 DIAGNOSIS — M5442 Lumbago with sciatica, left side: Secondary | ICD-10-CM | POA: Diagnosis not present

## 2020-12-14 NOTE — Therapy (Addendum)
[facility] [address] [place], [place], [address] Phone: [phone]   Fax:  [phone]  Physical Therapy Treatment/Re-Cert  Patient Details  Name: Jesse [name] MRN: [id number] Date of Birth: 04-06-1941 Referring Provider (PT): Andrey Spearman, MD   Encounter Date: 12/14/2020   PT End of Session - 12/14/20 1150    Visit Number 14    Number of Visits 18    Date for PT Re-Evaluation 81/85/63   re-cert on 1/49/70   Authorization Type UHC Medicare    PT Start Time 1017    PT Stop Time 1100    PT Time Calculation (min) 43 min    Activity Tolerance Patient tolerated treatment well    Behavior During Therapy [facility] for tasks assessed/performed           Past Medical History:  Diagnosis Date   Depression    Hypercholesteremia    Hypertension    HZ (herpes zoster) 09/02/2011   Neuritis    left foot   Polycythemia rubra vera ([place]) 09/02/2011    Past Surgical History:  Procedure Laterality Date   GAS INSERTION  06/04/2012   Procedure: INSERTION OF GAS;  Surgeon: Hayden Pedro, MD;  Location: [place];  Service: Ophthalmology;  Laterality: Left;  C3F8   SCLERAL BUCKLE  06/04/2012   Procedure: SCLERAL BUCKLE;  Surgeon: Hayden Pedro, MD;  Location: [facility];  Service: Ophthalmology;  Laterality: Left;  Headscope laser    There were no vitals filed for this visit.   Subjective Assessment - 12/14/20 1020    Subjective Feels about 80% back to normal, but not quite to where he should be. Overall, has gotten better    Pertinent History PMH: HTN    Limitations Walking    Diagnostic tests MRI from 10/24: 2.    At L4-L5, degenerative changes cause moderately severe left foraminal narrowing and severe left lateral recess stenosis with potential for left L4 and left L5 nerve root compression.  3.    At L5-S1, degenerative changes cause moderately severe right lateral recess stenosis with potential for right S1 nerve  root compression.    Patient Stated Goals wants to get rid of the pain.                12/14/20 1028  AROM  Overall AROM Comments performed AROM again at end of session, pt reporting no pain with L side bending after soft tissue mobilization to L lumbar paraspinals/quadratus lumborum  Lumbar Flexion able to touch toes with bending legs, discomfort in L low back  Lumbar Extension 50% limited  Lumbar - Right Side Bend WFL  Lumbar - Left Side Bend WFL (pain in L low back)  Strength  Left Knee Flexion 5/5  Left Knee Extension 4+/5  Right Hip Flexion 5/5  Right Hip Extension 4-/5  Right Hip ABduction 4+/5  Left Hip Flexion 4+/5  Left Hip Extension 3/5  Left Hip ABduction 4/5  Right Knee Flexion 5/5  Right Knee Extension 5/5  Right Ankle Dorsiflexion 5/5  Left Ankle Dorsiflexion 5/5  Right/Left Hip Right;Left  Palpation  Palpation comment incr TTP to L lower lumbar paraspinals, quadratus lumborum. pt with palpable trigger point in L evertors  Slump test  Findings Negative  Comment both sides, just reports tightness in L calf  Prone Knee Bend Test  Comment reports incr tightness with both legs  Straight Leg Raise  Findings Negative  Comment reports tightness in B hamstrings  Access Code: [email or web address] URL: [email or web address] Date: 12/14/2020 Prepared by: Janann August  Reviewed bolded exercise and added gastroc stretch to pt's HEP:    Exercises Seated Hamstring Stretch - 1-2 x daily - 5 x weekly - 3 sets - 25-30 hold - performed initially as hamstring stretch and then performed with incr inversion to stretch evertors, pt reporting that this stretch felt good (also added this modification to HEP). Pt needs cues to hold stretch, tends to just stay in position for a couple seconds.  Supine Figure 4 Piriformis Stretch - 1-2 x daily - 5 x weekly - 3 sets - 30 hold Lower Trunk Rotations - 1-2 x daily - 5 x weekly - 2 sets - 10 reps Prone Press Up on Elbows -  1 x daily - 5 x weekly - 1-2 sets - 10 reps Seated Sciatic Tensioner - 2 x daily - 5 x weekly - 2 sets - 10 reps Standing Lumbar Spine Flexion Stretch Counter - 1 x daily - 5 x weekly - 2 sets - 10 reps Standing Lumbar Extension - 1-2 x daily - 5 x weekly - 2 sets - 10 reps Gastroc Stretch on Wall - 1 x daily - 5 x weekly - 3 sets - 20-30 hold - verbal and demo cues for proper technique                 12/15/20 1524  PT Education  Education Details discussed POC as today is pt's last scheduled appt, pt reporting he feels better and about 80% back to normal, would like to go for another couple more sessions (will schedule 4 more just in case) and discussed findings from today's re-assessment, pt in agreement with plan. added stretches to pt's HEP  Person(s) Educated Patient  Methods Explanation;Demonstration;Verbal cues  Comprehension Verbalized understanding;Returned demonstration          PT Short Term Goals - 09/19/20 1433      PT SHORT TERM GOAL #1   Title ALL STGS = LTGS             PT Long Term Goals - 11/20/20 0854      PT LONG TERM GOAL #1   Title Pt will be independent with final HEP in order to build upon functional gains made in therapy. ALL LTGS DUE 12/18/20    Baseline pt still needing cues on how to perform correctly, reports performing intermittently at home    Time 4   due to delay in scheduling   Period Weeks    Status On-going    Target Date 12/18/20      PT LONG TERM GOAL #2   Title Pt will decr ODI score to 18% or less in order to demo decr disability related to back pain.    Baseline 20% on 09/20/20, 24% on 10/16/20    Time 4    Period Weeks    Status On-going      PT LONG TERM GOAL #3   Title --    Baseline --    Time --    Period --    Status --      PT LONG TERM GOAL #4   Title --    Baseline --    Time --    Period --    Status --          On-going/revised LTGs for re-cert:     PT Long Term Goals - 12/15/20 1534       PT  LONG TERM GOAL #1   Title Pt will be independent with final HEP in order to build upon functional gains made in therapy. ALL LTGS DUE 01/12/21    Baseline pt still needing cues on how to perform correctly, reports performing intermittently at home    Time 4   due to delay in scheduling   Period Weeks    Status On-going    Target Date 01/12/21      PT LONG TERM GOAL #2   Title Pt will decr ODI score to 18% or less in order to demo decr disability related to back pain.    Baseline 20% on 09/20/20, 24% on 10/16/20    Time 4    Period Weeks    Status On-going      PT LONG TERM GOAL #3   Title Pt will improve L hip ABD strength to a 4+/5 and hip extensor strength to a 4-/5 in order to demo improved hip strength for functional mobility.    Baseline ABD = 4/5, extensor at 3/5    Time 4    Period Weeks    Status New                12/15/20 1527  Plan  Clinical Impression Statement Pt still reporting L low back pain, however is no longer radiating pain and is localized to L low back. Reports it has gotten better since beginning therapy and now pain is more localized in pt's L evertors. Pt reports this pain gets worse when going up/down the incline for his driveway. Pt with a negative slump, straight leg raise, and prone knee bend test today - pt just reporting incr tightness in hamstrings and quad/hip flexors. Since last re-assessment pt with improvement of L hip strength to 4+/5 and ABD to 4/5. Still more weakness in L hip extensors at 3/5. At end of session, pt with improved lumbar AROM and reporting no pain with L side bending after exercises and mobilizations. Will re-cert for additional 1x week for 4 weeks to finalize HEP, improve ROM, decr pain in L lumbar musculature and L evertors, and improve strength to LLE and B hip musculature. LTGs revised and updated as appropriate.  Personal Factors and Comorbidities Comorbidity 2;Past/Current Experience;Time since onset of  injury/illness/exacerbation  Comorbidities PMH: HTN, Polycythemia rubra vera  Examination-Activity Limitations Locomotion Level;Stairs  Examination-Participation Restrictions Community Activity (playing golf)  Pt will benefit from skilled therapeutic intervention in order to improve on the following deficits Decreased activity tolerance;Decreased range of motion;Decreased mobility;Decreased strength;Difficulty walking;Pain;Improper body mechanics;Impaired sensation;Increased muscle spasms;Hypomobility  Stability/Clinical Decision Making Stable/Uncomplicated  Rehab Potential Good  PT Frequency 1x / week  PT Duration 4 weeks  PT Treatment/Interventions ADLs/Self Care Home Management;Gait training;Stair training;Functional mobility training;Therapeutic activities;Therapeutic exercise;Balance training;Patient/family education;Neuromuscular re-education;Manual techniques;Passive range of motion  PT Next Visit Plan Add lift to L shoe ? stretches and muscle energy techniques for hamstrings and hip flexor/quads. mobilizations to lumbar spine and soft tissue work to L evertors, quadratus/paraspinals.  L hip extensor/ABD/calf strengthening.  Consulted and Agree with Plan of Care Patient      Patient will benefit from skilled therapeutic intervention in order to improve the following deficits and impairments:     Visit Diagnosis: Other disturbances of skin sensation  Muscle weakness (generalized)  Chronic left-sided low back pain with left-sided sciatica     Problem List Patient Active Problem List   Diagnosis Date Noted   Need for prophylactic vaccination and inoculation against influenza 06/17/2016  Polycythemia rubra vera ([place]) 09/02/2011   HZ (herpes zoster) 09/02/2011    Arliss Journey, PT, DPT  12/14/2020, 11:52 AM  [facility] [address] [place], [place], [address] Phone: [phone]   Fax:   [phone]  Name: KYCE GING MRN: [id number] Date of Birth: 1941-02-16

## 2020-12-15 NOTE — Addendum Note (Signed)
Addended by: Arliss Journey on: 12/15/2020 03:38 PM   Modules accepted: Orders

## 2020-12-21 ENCOUNTER — Ambulatory Visit: Payer: Medicare Other

## 2020-12-25 DIAGNOSIS — E538 Deficiency of other specified B group vitamins: Secondary | ICD-10-CM | POA: Diagnosis not present

## 2020-12-28 ENCOUNTER — Ambulatory Visit: Payer: Medicare Other | Attending: Diagnostic Neuroimaging

## 2020-12-28 ENCOUNTER — Ambulatory Visit: Payer: Medicare Other | Admitting: Family

## 2020-12-28 ENCOUNTER — Other Ambulatory Visit: Payer: Self-pay

## 2020-12-28 ENCOUNTER — Other Ambulatory Visit: Payer: Medicare Other

## 2020-12-28 DIAGNOSIS — M545 Low back pain, unspecified: Secondary | ICD-10-CM | POA: Insufficient documentation

## 2020-12-28 DIAGNOSIS — G8929 Other chronic pain: Secondary | ICD-10-CM | POA: Diagnosis not present

## 2020-12-28 DIAGNOSIS — M6281 Muscle weakness (generalized): Secondary | ICD-10-CM | POA: Diagnosis not present

## 2020-12-28 DIAGNOSIS — R2681 Unsteadiness on feet: Secondary | ICD-10-CM | POA: Insufficient documentation

## 2020-12-28 DIAGNOSIS — M5442 Lumbago with sciatica, left side: Secondary | ICD-10-CM | POA: Insufficient documentation

## 2020-12-28 NOTE — Patient Instructions (Signed)
Added standing lumbar extension at counter, 10x 3x/day

## 2020-12-28 NOTE — Therapy (Signed)
[facility] [address] [place], [place], [address] Phone: [phone]   Fax:  [phone]  Physical Therapy Treatment  Patient Details  Name: Jesse [name] MRN: [id number] Date of Birth: September 10, 1941 Referring Provider (PT): Andrey Spearman, MD   Encounter Date: 12/28/2020   PT End of Session - 12/28/20 1120    Visit Number 15    Number of Visits 18    Date for PT Re-Evaluation 01/15/21    Authorization Type UHC Medicare    PT Start Time 1015    PT Stop Time 1055    PT Time Calculation (min) 40 min    Activity Tolerance Patient tolerated treatment well    Behavior During Therapy [facility] for tasks assessed/performed           Past Medical History:  Diagnosis Date  . Depression   . Hypercholesteremia   . Hypertension   . HZ (herpes zoster) 09/02/2011  . Neuritis    left foot  . Polycythemia rubra vera ([place]) 09/02/2011    Past Surgical History:  Procedure Laterality Date  . GAS INSERTION  06/04/2012   Procedure: INSERTION OF GAS;  Surgeon: Hayden Pedro, MD;  Location: [place];  Service: Ophthalmology;  Laterality: Left;  C3F8  . SCLERAL BUCKLE  06/04/2012   Procedure: SCLERAL BUCKLE;  Surgeon: Hayden Pedro, MD;  Location: [place];  Service: Ophthalmology;  Laterality: Left;  Headscope laser    There were no vitals filed for this visit.   Subjective Assessment - 12/28/20 1114    Subjective Improved overall but still notes nagging symptoms to L lateral leg, has been trying to perfom HEP as much as possible    Pertinent History PMH: HTN    Limitations Walking    Diagnostic tests MRI from 10/24: 2.    At L4-L5, degenerative changes cause moderately severe left foraminal narrowing and severe left lateral recess stenosis with potential for left L4 and left L5 nerve root compression.  3.    At L5-S1, degenerative changes cause moderately severe right lateral recess stenosis with potential for right S1 nerve root  compression.    Patient Stated Goals wants to get rid of the pain.                             [facility] PT Treatment/Exercise - 12/28/20 0001      Manual Therapy   Manual Therapy Soft tissue mobilization;Joint mobilization    Manual therapy comments manual techniques to improve lumbar mobility correct SI dysfunction and improve symptoms, ITB stretch seved as baseline initially reproducing symptoms but asymptomatic at end of session, assesment found anterior rotation of R ilium and subsequent leg length discrepancy, R longer than L, performed muscle energy 2 bouts of three with follow up, pelvic alignment restored, leg length equal.  Progressed to STM to L peroneals and posterior tibial mobs which reduced symptoms post treatment.  PA mobs to L2-4, 2x10.                  PT Education - 12/28/20 1100    Education Details standing lumbar extension mobs at counter    Methods Demonstration;Tactile cues;Explanation;Verbal cues;Handout    Comprehension Returned demonstration;Verbalized understanding            PT Short Term Goals - 09/19/20 1433      PT SHORT TERM GOAL #1   Title ALL STGS = LTGS  PT Long Term Goals - 12/15/20 1534      PT LONG TERM GOAL #1   Title Pt will be independent with final HEP in order to build upon functional gains made in therapy. ALL LTGS DUE 01/12/21    Baseline pt still needing cues on how to perform correctly, reports performing intermittently at home    Time 4   due to delay in scheduling   Period Weeks    Status On-going    Target Date 01/12/21      PT LONG TERM GOAL #2   Title Pt will decr ODI score to 18% or less in order to demo decr disability related to back pain.    Baseline 20% on 09/20/20, 24% on 10/16/20    Time 4    Period Weeks    Status On-going      PT LONG TERM GOAL #3   Title Pt will improve L hip ABD strength to a 4+/5 and hip extensor strength to a 4-/5 in order to demo improved hip  strength for functional mobility.    Baseline ABD = 4/5, extensor at 3/5    Time 4    Period Weeks    Status New                 Plan - 12/28/20 1102    Clinical Impression Statement Todays skilled session focused on manual techniques to improve lumbar mobility correct SI dysfunction and improve symptoms, ITB stretch seved as baseline initially reproducing symptoms but asymptomatic at end of session, assesment found anterior rotation of R ilium and subsequent leg length discrepancy, R longer than L, performed muscle energy 2 bouts of three with follow up, pelvic alignment restored, leg length equal.  Progressed to STM to L peroneals and posterior fibular mobs which reduced symptoms post treatment.  PA mobs to L2-4, 2x10.  Sciatic tension test negative at end of session.    Personal Factors and Comorbidities Comorbidity 2;Past/Current Experience;Time since onset of injury/illness/exacerbation    Comorbidities PMH: HTN, Polycythemia rubra vera    Examination-Activity Limitations Locomotion Level;Stairs    Examination-Participation Restrictions Community Activity   playing golf   Stability/Clinical Decision Making Stable/Uncomplicated    Rehab Potential Good    PT Frequency 1x / week    PT Duration 4 weeks    PT Treatment/Interventions ADLs/Self Care Home Management;Gait training;Stair training;Functional mobility training;Therapeutic activities;Therapeutic exercise;Balance training;Patient/family education;Neuromuscular re-education;Manual techniques;Passive range of motion    PT Next Visit Plan assess benefit on manual intervention at next session, recheck SI alignment and leg length, continue lumbar mobs    Consulted and Agree with Plan of Care Patient           Patient will benefit from skilled therapeutic intervention in order to improve the following deficits and impairments:  Decreased activity tolerance,Decreased range of motion,Decreased mobility,Decreased strength,Difficulty  walking,Pain,Improper body mechanics,Impaired sensation,Increased muscle spasms,Hypomobility  Visit Diagnosis: Unsteadiness on feet  Muscle weakness (generalized)     Problem List Patient Active Problem List   Diagnosis Date Noted  . Need for prophylactic vaccination and inoculation against influenza 06/17/2016  . Polycythemia rubra vera ([place]) 09/02/2011  . HZ (herpes zoster) 09/02/2011    Lanice Shirts PT 12/28/2020, 11:27 AM  [facility] [address] [place], [place], [address] Phone: [phone]   Fax:  [phone]  Name: ELWOOD BAZINET MRN: [id number] Date of Birth: 1941/06/17

## 2020-12-29 DIAGNOSIS — R350 Frequency of micturition: Secondary | ICD-10-CM | POA: Diagnosis not present

## 2020-12-29 DIAGNOSIS — R3912 Poor urinary stream: Secondary | ICD-10-CM | POA: Diagnosis not present

## 2020-12-29 DIAGNOSIS — N281 Cyst of kidney, acquired: Secondary | ICD-10-CM | POA: Diagnosis not present

## 2021-01-04 ENCOUNTER — Ambulatory Visit: Payer: Medicare Other | Admitting: Physical Therapy

## 2021-01-04 ENCOUNTER — Other Ambulatory Visit: Payer: Self-pay

## 2021-01-04 ENCOUNTER — Encounter: Payer: Self-pay | Admitting: Physical Therapy

## 2021-01-04 DIAGNOSIS — G8929 Other chronic pain: Secondary | ICD-10-CM

## 2021-01-04 DIAGNOSIS — M545 Low back pain, unspecified: Secondary | ICD-10-CM | POA: Diagnosis not present

## 2021-01-04 DIAGNOSIS — M6281 Muscle weakness (generalized): Secondary | ICD-10-CM | POA: Diagnosis not present

## 2021-01-04 DIAGNOSIS — M5442 Lumbago with sciatica, left side: Secondary | ICD-10-CM

## 2021-01-04 DIAGNOSIS — R2681 Unsteadiness on feet: Secondary | ICD-10-CM | POA: Diagnosis not present

## 2021-01-04 NOTE — Therapy (Signed)
[facility] [address] [place], [place], [address] Phone: [phone]   Fax:  [phone]  Physical Therapy Treatment  Patient Details  Name: Jesse [name] MRN: [id number] Date of Birth: [birth year] Referring Provider (PT): Andrey Spearman, MD   Encounter Date: 01/04/2021   PT End of Session - 01/04/21 1340    Visit Number 16    Number of Visits 18    Date for PT Re-Evaluation 01/15/21    Authorization Type UHC Medicare    PT Start Time 1017    PT Stop Time 1100    PT Time Calculation (min) 43 min    Activity Tolerance Patient tolerated treatment well    Behavior During Therapy [facility] for tasks assessed/performed           Past Medical History:  Diagnosis Date  . Depression   . Hypercholesteremia   . Hypertension   . HZ (herpes zoster) 09/02/2011  . Neuritis    left foot  . Polycythemia rubra vera ([place]) 09/02/2011    Past Surgical History:  Procedure Laterality Date  . GAS INSERTION  06/04/2012   Procedure: INSERTION OF GAS;  Surgeon: Hayden Pedro, MD;  Location: [facility];  Service: Ophthalmology;  Laterality: Left;  C3F8  . SCLERAL BUCKLE  06/04/2012   Procedure: SCLERAL BUCKLE;  Surgeon: Hayden Pedro, MD;  Location: [facility];  Service: Ophthalmology;  Laterality: Left;  Headscope laser    There were no vitals filed for this visit.   Subjective Assessment - 01/04/21 1018    Subjective Feeling better since last time. Still nagging in L lateral leg and L low back. Gabapentin still eases off the pain    Pertinent History PMH: HTN    Limitations Walking    Diagnostic tests MRI from 10/24: 2.    At L4-L5, degenerative changes cause moderately severe left foraminal narrowing and severe left lateral recess stenosis with potential for left L4 and left L5 nerve root compression.  3.    At L5-S1, degenerative changes cause moderately severe right lateral recess stenosis with potential for right S1 nerve root  compression.    Patient Stated Goals wants to get rid of the pain.    Currently in Pain? No/denies                             [facility] Adult PT Treatment/Exercise - 01/04/21 1054      Lumbar Exercises: Aerobic   Nustep with BLE and BUE for strengthening and ROM gear 4.0 for 6 minutes. cues for rpm at 50      Lumbar Exercises: Supine   Bridge with [employer] 10 reps    Bridge with [employer] Limitations squeezing yoga block gently first before lifting    Other Supine Lumbar Exercises in hooklying hip ADD squeeze with use of yoga block, cues for gentle squeeze for 3 seconds x10 reps      Manual Therapy   Manual Therapy Soft tissue mobilization;Joint mobilization    Manual therapy comments reassessed SI rotation with R anterior rotation of ASIS with RLE longer than LLE, performed muscle energy technique to R hamstrings/L hip flexors to restore proper length 2 reps of 4 bouts with equal leg length aftrwards    Joint Mobilization grade II/III CPA mobilizations to decr stiffness,pain and improve extension to L2-L4 - each segment approx. 10 bouts x3 reps, pt with hypomobility. pt reporting feeling relief afterwards.  Soft tissue mobilization to L quadratus lumborum and L lumbar paraspinals with pt in supine position, STM to L peroneals. performed muscle energy technique to L quadratus lumborum x5 bouts with holds in stretch for 10-15 seconds with pt in sidelying.                    PT Short Term Goals - 09/19/20 1433      PT SHORT TERM GOAL #1   Title ALL STGS = LTGS             PT Long Term Goals - 12/15/20 1534      PT LONG TERM GOAL #1   Title Pt will be independent with final HEP in order to build upon functional gains made in therapy. ALL LTGS DUE 01/12/21    Baseline pt still needing cues on how to perform correctly, reports performing intermittently at home    Time 4   due to delay in scheduling   Period Weeks    Status On-going    Target Date  01/12/21      PT LONG TERM GOAL #2   Title Pt will decr ODI score to 18% or less in order to demo decr disability related to back pain.    Baseline 20% on 09/20/20, 24% on 10/16/20    Time 4    Period Weeks    Status On-going      PT LONG TERM GOAL #3   Title Pt will improve L hip ABD strength to a 4+/5 and hip extensor strength to a 4-/5 in order to demo improved hip strength for functional mobility.    Baseline ABD = 4/5, extensor at 3/5    Time 4    Period Weeks    Status New                 Plan - 01/04/21 1342    Clinical Impression Statement Today's skilled session focused on hip strengthening and manual techniques to improve lumbar mobilization and correct leg length discrepancy due to SI dysfunction. Pt with RLE longer than LLE, performed muscle energy technique 2 bouts of 4 reps with improvement. Pt reporting an improvement in his gait afterwards. Pt reporting relief from STM to L peroneals and L quad lumborum/paraspinals. Will continue to progress towards LTGs.    Personal Factors and Comorbidities Comorbidity 2;Past/Current Experience;Time since onset of injury/illness/exacerbation    Comorbidities PMH: HTN, Polycythemia rubra vera    Examination-Activity Limitations Locomotion Level;Stairs    Examination-Participation Restrictions Community Activity   playing golf   Stability/Clinical Decision Making Stable/Uncomplicated    Rehab Potential Good    PT Frequency 1x / week    PT Duration 4 weeks    PT Treatment/Interventions ADLs/Self Care Home Management;Gait training;Stair training;Functional mobility training;Therapeutic activities;Therapeutic exercise;Balance training;Patient/family education;Neuromuscular re-education;Manual techniques;Passive range of motion    PT Next Visit Plan this is last visit - D/C vs. recert? how was manual therapy?, recheck SI alignment and leg length, continue lumbar mobs and hip strenghtening.    Consulted and Agree with Plan of Care  Patient           Patient will benefit from skilled therapeutic intervention in order to improve the following deficits and impairments:  Decreased activity tolerance,Decreased range of motion,Decreased mobility,Decreased strength,Difficulty walking,Pain,Improper body mechanics,Impaired sensation,Increased muscle spasms,Hypomobility  Visit Diagnosis: Muscle weakness (generalized)  Chronic left-sided low back pain with left-sided sciatica     Problem List Patient Active Problem List   Diagnosis Date  Noted  . Need for prophylactic vaccination and inoculation against influenza 06/17/2016  . Polycythemia rubra vera ([place]) 09/02/2011  . HZ (herpes zoster) 09/02/2011    Arliss Journey, PT, DPT  01/04/2021, 1:48 PM  [facility] [address] [place], [place], [address] Phone: [phone]   Fax:  [phone]  Name: Jesse [name] MRN: [id number] Date of Birth: 1941-08-02

## 2021-01-11 ENCOUNTER — Other Ambulatory Visit: Payer: Self-pay

## 2021-01-11 ENCOUNTER — Ambulatory Visit: Payer: Medicare Other

## 2021-01-11 DIAGNOSIS — M6281 Muscle weakness (generalized): Secondary | ICD-10-CM

## 2021-01-11 DIAGNOSIS — M5442 Lumbago with sciatica, left side: Secondary | ICD-10-CM

## 2021-01-11 DIAGNOSIS — R2681 Unsteadiness on feet: Secondary | ICD-10-CM | POA: Diagnosis not present

## 2021-01-11 DIAGNOSIS — M545 Low back pain, unspecified: Secondary | ICD-10-CM | POA: Diagnosis not present

## 2021-01-11 DIAGNOSIS — G8929 Other chronic pain: Secondary | ICD-10-CM | POA: Diagnosis not present

## 2021-01-11 NOTE — Therapy (Signed)
[facility] [address] [place], [place], [address] Phone: [phone]   Fax:  [phone]  Physical Therapy Treatment  Patient Details  Name: Jesse [name] MRN: [id number] Date of Birth: 1941/06/11 Referring Provider (PT): Andrey Spearman, MD   Encounter Date: 01/11/2021   PT End of Session - 01/11/21 1153    Visit Number 17    Number of Visits 18    Date for PT Re-Evaluation 01/15/21    Authorization Type UHC Medicare    PT Start Time 1015    PT Stop Time 1100    PT Time Calculation (min) 45 min    Activity Tolerance Patient tolerated treatment well    Behavior During Therapy [facility] for tasks assessed/performed           Past Medical History:  Diagnosis Date  . Depression   . Hypercholesteremia   . Hypertension   . HZ (herpes zoster) 09/02/2011  . Neuritis    left foot  . Polycythemia rubra vera ([place]) 09/02/2011    Past Surgical History:  Procedure Laterality Date  . GAS INSERTION  06/04/2012   Procedure: INSERTION OF GAS;  Surgeon: Hayden Pedro, MD;  Location: [facility];  Service: Ophthalmology;  Laterality: Left;  C3F8  . SCLERAL BUCKLE  06/04/2012   Procedure: SCLERAL BUCKLE;  Surgeon: Hayden Pedro, MD;  Location: [facility];  Service: Ophthalmology;  Laterality: Left;  Headscope laser    There were no vitals filed for this visit.   Subjective Assessment - 01/11/21 1152    Subjective Pt reports feeloing better immediatley following therapy but says it does not last.  He woke up this morning with tightness in his hips and lower back    Pertinent History PMH: HTN    Limitations Walking    Diagnostic tests MRI from 10/24: 2.    At L4-L5, degenerative changes cause moderately severe left foraminal narrowing and severe left lateral recess stenosis with potential for left L4 and left L5 nerve root compression.  3.    At L5-S1, degenerative changes cause moderately severe right lateral recess stenosis with  potential for right S1 nerve root compression.    Patient Stated Goals wants to get rid of the pain.    Currently in Pain? No/denies              [facility] PT Assessment - 01/11/21 0001      Slump test   Findings Negative    Comment L side; pt reports tightness in posterior calf, no radicular symptoms noted                         [facility] Adult PT Treatment/Exercise - 01/11/21 1020      Lumbar Exercises: Aerobic   Nustep BLE and BUE (arms set at 12) resistance 4.0 for 6 minutes for strengthening and ROM      Knee/Hip Exercises: Stretches   Gastroc Stretch Left;3 reps;30 seconds    Gastroc Stretch Limitations standing at counter in split stance position; verbal cueing to lean forward and feel a strong but comfortable stretch    Other Knee/Hip Stretches Sidelying hip flexor stretch and L sided thomas test indicates restrictions in L hip flexor; pt reports most stretch flet in distal portion of quad      Manual Therapy   Manual Therapy Joint mobilization;Soft tissue mobilization;Passive ROM    Manual therapy comments reassess SIJ; left posterior innominate rotation noted with supine and  standing ASIS/PSIS palpation, significantly more movement noted with Gillet test L PSIS palpation; prone muscle energy technique used for hip flexor mobilization to initiate anterior rotation - 2 bouts of 3x5 sec isometric hold; reassessment following indicated improvement with only slight discrepencies noted in LLI at malleoli.    Joint Mobilization Grade II/III mobilization distal fibula - reproduction of symptoms in lateral lower leg; posterior glide fibular head; grade II/III PA L4/L5    Soft tissue mobilization L peroneal to address soft tissue restrictions and trigger point tenderness; no significant restrictions noted in piriformis; soft tissue restrictions present in L erector spinae musculature compared to R    Passive ROM Restrictions in hamstring length with passive straight leg raise,  tightness present in ITB/TFL with passive hip ADD stretch                  PT Education - 01/11/21 1057    Education Details [id number]    Person(s) Educated Patient    Methods Explanation;Demonstration;Tactile cues;Verbal cues;Handout    Comprehension Verbalized understanding;Returned demonstration;Need further instruction            PT Short Term Goals - 09/19/20 1433      PT SHORT TERM GOAL #1   Title ALL STGS = LTGS             PT Long Term Goals - 12/15/20 1534      PT LONG TERM GOAL #1   Title Pt will be independent with final HEP in order to build upon functional gains made in therapy. ALL LTGS DUE 01/12/21    Baseline pt still needing cues on how to perform correctly, reports performing intermittently at home    Time 4   due to delay in scheduling   Period Weeks    Status On-going    Target Date 01/12/21      PT LONG TERM GOAL #2   Title Pt will decr ODI score to 18% or less in order to demo decr disability related to back pain.    Baseline 20% on 09/20/20, 24% on 10/16/20    Time 4    Period Weeks    Status On-going      PT LONG TERM GOAL #3   Title Pt will improve L hip ABD strength to a 4+/5 and hip extensor strength to a 4-/5 in order to demo improved hip strength for functional mobility.    Baseline ABD = 4/5, extensor at 3/5    Time 4    Period Weeks    Status New                 Plan - 01/11/21 1154    Clinical Impression Statement SIJ dysfunction reassessment indicated a L posterior innominate rotation that was treated with prone muscle energy technique.  Pt was negative for lumbar radiculopathy following Slump Test.  Other soft tissue restrictions present include L paraspinals, L hip flexor, and L peroneals.  Addressed with soft tissue and joint mobilizations to reduce pain and restore ROM.  Updated HEP to include a gastroc stretch.    Personal Factors and Comorbidities Comorbidity 2;Past/Current Experience;Time since onset of  injury/illness/exacerbation    Comorbidities PMH: HTN, Polycythemia rubra vera    Examination-Activity Limitations Locomotion Level;Stairs    Examination-Participation Restrictions Community Activity   playing golf   Stability/Clinical Decision Making Stable/Uncomplicated    Rehab Potential Good    PT Frequency 1x / week    PT Duration 4 weeks    PT  Treatment/Interventions ADLs/Self Care Home Management;Gait training;Stair training;Functional mobility training;Therapeutic activities;Therapeutic exercise;Balance training;Patient/family education;Neuromuscular re-education;Manual techniques;Passive range of motion    PT Next Visit Plan this is last visit - check LTGs - D/C vs. recert? LT how was manual therapy?, recheck SI alignment and leg length, continue lumbar mobs and hip strenghtening.    Consulted and Agree with Plan of Care Patient           Patient will benefit from skilled therapeutic intervention in order to improve the following deficits and impairments:  Decreased activity tolerance,Decreased range of motion,Decreased mobility,Decreased strength,Difficulty walking,Pain,Improper body mechanics,Impaired sensation,Increased muscle spasms,Hypomobility  Visit Diagnosis: Muscle weakness (generalized)  Chronic left-sided low back pain with left-sided sciatica     Problem List Patient Active Problem List   Diagnosis Date Noted  . Need for prophylactic vaccination and inoculation against influenza 06/17/2016  . Polycythemia rubra vera ([place]) 09/02/2011  . HZ (herpes zoster) 09/02/2011   Jesse [name] PT Yetta Numbers, SPT 01/11/2021, 12:03 PM  [facility] [address] [place], [place], [address] Phone: [phone]   Fax:  [phone]  Name: Jesse [name] MRN: [id number] Date of Birth: 1941-05-25

## 2021-01-11 NOTE — Patient Instructions (Signed)
Access Code: [email or web address] URL: [email or web address] Date: 01/11/2021 Prepared by: Sharlynn Oliphant  Exercises Standing Lumbar Extension with Counter - 2 x daily - 7 x weekly - 1 sets - 3 reps - 30s hold Standing Gastroc Stretch at Counter - 2 x daily - 7 x weekly - 1 sets - 3 reps - 30s hold

## 2021-01-18 ENCOUNTER — Other Ambulatory Visit: Payer: Self-pay

## 2021-01-18 ENCOUNTER — Ambulatory Visit: Payer: Medicare Other | Admitting: Physical Therapy

## 2021-01-18 DIAGNOSIS — R2681 Unsteadiness on feet: Secondary | ICD-10-CM

## 2021-01-18 DIAGNOSIS — M5442 Lumbago with sciatica, left side: Secondary | ICD-10-CM | POA: Diagnosis not present

## 2021-01-18 DIAGNOSIS — G8929 Other chronic pain: Secondary | ICD-10-CM

## 2021-01-18 DIAGNOSIS — M6281 Muscle weakness (generalized): Secondary | ICD-10-CM | POA: Diagnosis not present

## 2021-01-18 DIAGNOSIS — M545 Low back pain, unspecified: Secondary | ICD-10-CM | POA: Diagnosis not present

## 2021-01-18 NOTE — Therapy (Signed)
[facility] [address] [place], [place], [address] Phone: [phone]   Fax:  [phone]  Physical Therapy Treatment/Re-Cert  Patient Details  Name: Jesse [name] MRN: [id number] Date of Birth: May 28, 1941 Referring Provider (PT): Andrey Spearman, MD   Encounter Date: 01/18/2021   PT End of Session - 01/18/21 2038    Visit Number 18    Number of Visits 22    Date for PT Re-Evaluation 03/19/21   written for 30 day POC   Authorization Type UHC Medicare    PT Start Time 1020    PT Stop Time 1102    PT Time Calculation (min) 42 min    Activity Tolerance Patient tolerated treatment well    Behavior During Therapy [facility] for tasks assessed/performed           Past Medical History:  Diagnosis Date  . Depression   . Hypercholesteremia   . Hypertension   . HZ (herpes zoster) 09/02/2011  . Neuritis    left foot  . Polycythemia rubra vera ([place]) 09/02/2011    Past Surgical History:  Procedure Laterality Date  . GAS INSERTION  06/04/2012   Procedure: INSERTION OF GAS;  Surgeon: Hayden Pedro, MD;  Location: [facility];  Service: Ophthalmology;  Laterality: Left;  C3F8  . SCLERAL BUCKLE  06/04/2012   Procedure: SCLERAL BUCKLE;  Surgeon: Hayden Pedro, MD;  Location: [facility];  Service: Ophthalmology;  Laterality: Left;  Headscope laser    There were no vitals filed for this visit.   Subjective Assessment - 01/18/21 1022    Subjective Feels slightly better. Reports it is still nagging in his low back and his calf. Reports when he exercises it feels better. Subjectively reports feeling 70% back to normal. Reports walking to the mailbox has gotten better and reports not usually having pain in his leg when doing so.    Pertinent History PMH: HTN    Limitations Walking    Diagnostic tests MRI from 10/24: 2.    At L4-L5, degenerative changes cause moderately severe left foraminal narrowing and severe left lateral recess  stenosis with potential for left L4 and left L5 nerve root compression.  3.    At L5-S1, degenerative changes cause moderately severe right lateral recess stenosis with potential for right S1 nerve root compression.    Patient Stated Goals wants to get rid of the pain.    Currently in Pain? No/denies              [facility] PT Assessment - 01/18/21 1048      Assessment   Medical Diagnosis L lumbar radiculopathy    Referring Provider (PT) Andrey Spearman, MD    Onset Date/Surgical Date 09/20/19      Prior Function   Level of Independence Independent      Observation/Other Assessments   Oswestry Disability Index  17% = mild disability      Strength   Right Hip Flexion 5/5    Right Hip Extension 4-/5    Right Hip ABduction 5/5    Left Hip Flexion 4/5    Left Hip Extension 3+/5    Left Hip ABduction 4+/5    Left Knee Flexion 5/5    Left Knee Extension 5/5    Right Ankle Plantar Flexion 3-/5    Left Ankle Dorsiflexion 5/5    Left Ankle Plantar Flexion 3/5      Flexibility   Soft Tissue Assessment /Muscle Length yes  Special Tests   Other special tests LLE length length 77 cm, RLE 78.25 cm                         [facility] Adult PT Treatment/Exercise - 01/18/21 2035      Therapeutic Activites    Therapeutic Activities Other Therapeutic Activities    Other Therapeutic Activities discussed POC going forwards and progress with therapy- plan to re-cert for 1x week for 4 weeks. Pt reports pain feels better with exercise, discussed with pt getting a gym membership (he used to be a member at [employer]) to use the seated stepper/recumbent bike, pt is going to look into it as his insurance will cover it. Also showed pt how to use a seated peddle bike for home use as well for exercise and showed where to purchase on [employer].                  PT Education - 01/18/21 2038    Education Details see TA    Person(s) Educated Patient    Methods  Explanation;Demonstration    Comprehension Verbalized understanding;Returned demonstration            PT Short Term Goals - 09/19/20 1433      PT SHORT TERM GOAL #1   Title ALL STGS = LTGS             PT Long Term Goals - 01/18/21 1039      PT LONG TERM GOAL #1   Title Pt will be independent with final HEP in order to build upon functional gains made in therapy. ALL LTGS DUE 01/12/21    Baseline pt reports he has been performing - did not have time to formally review, will benefit from review/revisions as needed.    Time 4   due to delay in scheduling   Period Weeks    Status On-going      PT LONG TERM GOAL #2   Title Pt will decr ODI score to 18% or less in order to demo decr disability related to back pain.    Baseline 24% on 10/16/20; 17% on 01/18/21    Time 4    Period Weeks    Status Achieved      PT LONG TERM GOAL #3   Title Pt will improve L hip ABD strength to a 4+/5 and hip extensor strength to a 4-/5 in order to demo improved hip strength for functional mobility.    Baseline ABD = 4+/5, hip extensor at 3+/5    Time 4    Period Weeks    Status Partially Met          Revised/ongoing LTGs:   PT Long Term Goals - 01/18/21 2055      PT LONG TERM GOAL #1   Title Pt will be independent with final HEP in order to build upon functional gains made in therapy. ALL LTGS DUE 02/22/21    Baseline pt reports he has been performing - did not have time to formally review, will benefit from review/revisions as needed.    Time 5   due to delay in scheduling   Period Weeks    Status On-going    Target Date 02/22/21      PT LONG TERM GOAL #2   Title Pt will report being able to perform standing activities >1 hour without low back pain in order to demonstrate improve tolerance for community activities.  Baseline unable to stand >1 hour due to pain    Time 5    Period Weeks    Status New      PT LONG TERM GOAL #3   Title Pt will improve L hip ABD strength to a 5/5 and  hip extensor strength to a 4-/5 in order to demo improved hip strength for functional mobility and gait mechanics.    Baseline ABD = 4+/5, hip extensor at 3+/5    Time 5    Period Weeks    Status New      PT LONG TERM GOAL #4   Title Pt will undergo trial of a shoe lift for leg length discrepancy to see if it will address low back pain.    Time 5    Period Weeks    Status New      PT LONG TERM GOAL #5   Title Pt will subjectively report being able to lift and carry his groceries into his house without any pain.    Baseline low back pain and L lateral calf pain/pain over peroneals when performing    Time 5    Period Weeks    Status New                 Plan - 01/18/21 2045    Clinical Impression Statement Pt has reported an improvement in his pain since start therapy, but still has nagging symptoms in his L low back and L peroneals that is made worse by standing >1 hour or when having to lift/carry his groceries. Pt met LTG #2 in regards to ODI - today's score was 17% (mild disability) and previously was 24% indicating moderate disability. Pt partially met LTG #3- improved L hip ABD strength to 4+/5, and hip extensors at 3+/5. Pt with a leg length discrepancy - with RLE longer than LLE. Have performed muscle energy techniques in previous sessions with pt reporting relief, but not longstanding results. Pt may benefit from trial of a shoe lift to level out leg length discrepenancy to see if it will improve his pain. Will re-cert for an additional 1x week for 4 weeks to continue to address pain, improve strength and mobility to improve functional mobility.    Personal Factors and Comorbidities Comorbidity 2;Past/Current Experience;Time since onset of injury/illness/exacerbation    Comorbidities PMH: HTN, Polycythemia rubra vera    Examination-Activity Limitations Locomotion Level;Stairs    Examination-Participation Restrictions Community Activity   playing golf   Stability/Clinical  Decision Making Stable/Uncomplicated    Rehab Potential Good    PT Frequency 1x / week    PT Duration 4 weeks    PT Treatment/Interventions ADLs/Self Care Home Management;Gait training;Stair training;Functional mobility training;Therapeutic activities;Therapeutic exercise;Balance training;Patient/family education;Neuromuscular re-education;Manual techniques;Passive range of motion    PT Next Visit Plan recheck SI alignment and leg length - trial shoe lift, lifting techniques to mimic lifting while carrying groceries (pt reports pain with this activity) continue lumbar mobs and hip strenghtening (L hip flexors, ABD, hip extensors)and PF strengthening.    Consulted and Agree with Plan of Care Patient           Patient will benefit from skilled therapeutic intervention in order to improve the following deficits and impairments:  Decreased activity tolerance,Decreased range of motion,Decreased mobility,Decreased strength,Difficulty walking,Pain,Improper body mechanics,Impaired sensation,Increased muscle spasms,Hypomobility  Visit Diagnosis: Muscle weakness (generalized)  Unsteadiness on feet  Chronic left-sided low back pain without sciatica     Problem List Patient Active Problem List  Diagnosis Date Noted  . Need for prophylactic vaccination and inoculation against influenza 06/17/2016  . Polycythemia rubra vera ([place]) 09/02/2011  . HZ (herpes zoster) 09/02/2011    Arliss Journey, PT, DPT  01/18/2021, 8:53 PM  [facility] [address] [place], [place], [address] Phone: [phone]   Fax:  [phone]  Name: Jesse [name] MRN: [id number] Date of Birth: 11-28-1940

## 2021-01-25 DIAGNOSIS — E538 Deficiency of other specified B group vitamins: Secondary | ICD-10-CM | POA: Diagnosis not present

## 2021-01-26 ENCOUNTER — Other Ambulatory Visit: Payer: Self-pay

## 2021-01-26 ENCOUNTER — Inpatient Hospital Stay: Payer: Medicare Other

## 2021-01-26 ENCOUNTER — Encounter: Payer: Self-pay | Admitting: Family

## 2021-01-26 ENCOUNTER — Inpatient Hospital Stay: Payer: Medicare Other | Attending: Family

## 2021-01-26 ENCOUNTER — Inpatient Hospital Stay (HOSPITAL_BASED_OUTPATIENT_CLINIC_OR_DEPARTMENT_OTHER): Payer: Medicare Other | Admitting: Family

## 2021-01-26 VITALS — BP 123/68 | HR 83 | Temp 97.8°F | Resp 16 | Ht 66.0 in | Wt 159.0 lb

## 2021-01-26 DIAGNOSIS — D500 Iron deficiency anemia secondary to blood loss (chronic): Secondary | ICD-10-CM

## 2021-01-26 DIAGNOSIS — D751 Secondary polycythemia: Secondary | ICD-10-CM

## 2021-01-26 DIAGNOSIS — D45 Polycythemia vera: Secondary | ICD-10-CM | POA: Diagnosis not present

## 2021-01-26 LAB — CMP (CANCER CENTER ONLY)
ALT: 15 U/L (ref 0–44)
AST: 15 U/L (ref 15–41)
Albumin: 4.1 g/dL (ref 3.5–5.0)
Alkaline Phosphatase: 53 U/L (ref 38–126)
Anion gap: 7 (ref 5–15)
BUN: 17 mg/dL (ref 8–23)
CO2: 30 mmol/L (ref 22–32)
Calcium: 10.6 mg/dL — ABNORMAL HIGH (ref 8.9–10.3)
Chloride: 98 mmol/L (ref 98–111)
Creatinine: 0.85 mg/dL (ref 0.61–1.24)
GFR, Estimated: >60 mL/min (ref >60)
Glucose, Bld: 106 mg/dL — ABNORMAL HIGH (ref 70–99)
Potassium: 4.2 mmol/L (ref 3.5–5.1)
Sodium: 135 mmol/L (ref 135–145)
Total Bilirubin: 1 mg/dL (ref 0.3–1.2)
Total Protein: 6.6 g/dL (ref 6.5–8.1)

## 2021-01-26 LAB — CBC WITH DIFFERENTIAL (CANCER CENTER ONLY)
Abs Immature Granulocytes: 0.02 10*3/uL (ref 0.00–0.07)
Basophils Absolute: 0 10*3/uL (ref 0.0–0.1)
Basophils Relative: 0 %
Eosinophils Absolute: 0.1 10*3/uL (ref 0.0–0.5)
Eosinophils Relative: 1 %
HCT: 44.6 % (ref 39.0–52.0)
Hemoglobin: 14.3 g/dL (ref 13.0–17.0)
Immature Granulocytes: 0 %
Lymphocytes Relative: 26 %
Lymphs Abs: 1.8 10*3/uL (ref 0.7–4.0)
MCH: 27.4 pg (ref 26.0–34.0)
MCHC: 32.1 g/dL (ref 30.0–36.0)
MCV: 85.6 fL (ref 80.0–100.0)
Monocytes Absolute: 1 10*3/uL (ref 0.1–1.0)
Monocytes Relative: 15 %
Neutro Abs: 3.9 10*3/uL (ref 1.7–7.7)
Neutrophils Relative %: 58 %
Platelet Count: 280 10*3/uL (ref 150–400)
RBC: 5.21 MIL/uL (ref 4.22–5.81)
RDW: 15.9 % — ABNORMAL HIGH (ref 11.5–15.5)
WBC Count: 6.7 10*3/uL (ref 4.0–10.5)
nRBC: 0 % (ref 0.0–0.2)

## 2021-01-26 NOTE — Progress Notes (Signed)
Hematology and Oncology Follow Up Visit  Jesse [name] [id number] 08-16-1941 80 y.o. 01/26/2021   Principle Diagnosis:  Polycythemia - JAK2 negative  Current Therapy: Phlebotomy to maintain hematocrit below 45% Aspirin 81 mg by mouth daily   Interim History:  [name] is here today for follow-up. He is doing well and has no complaints at this time.  He is in PT once a week and feels that this has helped improve his mobility and energy.  No bleeding, bruising or petechiae.  No falls or syncope. No swelling, numbness or tingling.  He has arthritis in his hands that is bothersome at times.  No fever, chills, n/v, cough, rash, dizziness, SOB, chest pain, palpitations, abdominal pain or changes in bowel or bladder habits.  He has maintained a good appetite and is staying well hydrated. His weight is stable at 159 lbs.   ECOG Performance Status: 0 - Asymptomatic  Medications:  Allergies as of 01/26/2021      Reactions   Tramadol Nausea And Vomiting      Medication List       Accurate as of Jan 26, 2021 11:49 AM. If you have any questions, ask your nurse or doctor.        acetaminophen 325 MG tablet Commonly known as: TYLENOL Take 325 mg by mouth every 6 (six) hours as needed for moderate pain.   ALPRAZolam 1 MG tablet Commonly known as: XANAX Take 0.5 mg by mouth at bedtime.   aspirin EC 81 MG tablet Take 81 mg by mouth daily.   fish oil-omega-3 fatty acids 1000 MG capsule Take 1 g by mouth daily.   gabapentin 100 MG capsule Commonly known as: NEURONTIN Take 100 mg by mouth 3 (three) times daily.   lisinopril-hydrochlorothiazide 20-12.5 MG tablet Commonly known as: ZESTORETIC Take 1 tablet by mouth daily.   ondansetron 4 MG disintegrating tablet Commonly known as: Zofran ODT Take 1 tablet (4 mg total) by mouth every 8 (eight) hours as needed for up to 15 doses for nausea or vomiting.   Pfizer-BioNT COVID-19 Vac-TriS Susp injection Generic drug: COVID-19  mRNA Vac-TriS (Pfizer) USE AS DIRECTED   simvastatin 20 MG tablet Commonly known as: ZOCOR Take 20 mg by mouth daily at 6 PM.   traMADol 50 MG tablet Commonly known as: ULTRAM Take 50 mg by mouth 2 (two) times daily as needed.   vitamin E 1000 UNIT capsule Take 1,000 Units by mouth daily.       Allergies:  Allergies  Allergen Reactions  . Tramadol Nausea And Vomiting    Past Medical History, Surgical history, Social history, and Family History were reviewed and updated.  Review of Systems: All other 10 point review of systems is negative.   Physical Exam:  height is 5\' 6"  (1.676 m) and weight is 159 lb (72.1 kg). His oral temperature is 97.8 F (36.6 C). His blood pressure is 123/68 and his pulse is 83. His respiration is 16 and oxygen saturation is 100%.   Wt Readings from Last 3 Encounters:  01/26/21 159 lb (72.1 kg)  11/27/20 156 lb 1.9 oz (70.8 kg)  09/29/20 155 lb 6.4 oz (70.5 kg)    Ocular: Sclerae unicteric, pupils equal, round and reactive to light Ear-nose-throat: Oropharynx clear, dentition fair Lymphatic: No cervical or supraclavicular adenopathy Lungs no rales or rhonchi, good excursion bilaterally Heart regular rate and rhythm, no murmur appreciated Abd soft, nontender, positive bowel sounds MSK no focal spinal tenderness, no joint edema Neuro: non-focal,  well-oriented, appropriate affect Breasts: Deferred  Lab Results  Component Value Date   WBC 6.7 01/26/2021   HGB 14.3 01/26/2021   HCT 44.6 01/26/2021   MCV 85.6 01/26/2021   PLT 280 01/26/2021   Lab Results  Component Value Date   FERRITIN 16 (L) 11/27/2020   IRON 59 11/27/2020   TIBC 413 (H) 11/27/2020   UIBC 354 11/27/2020   IRONPCTSAT 14 (L) 11/27/2020   Lab Results  Component Value Date   RETICCTPCT 1.5 01/11/2020   RBC 5.21 01/26/2021   RETICCTABS 87.5 01/02/2011   No results found for: KPAFRELGTCHN, LAMBDASER, KAPLAMBRATIO No results found for: IGGSERUM, IGA, IGMSERUM No  results found for: Kathrynn Ducking, MSPIKE, SPEI   Chemistry      Component Value Date/Time   NA 132 (L) 12/03/2020 1743   NA 136 08/19/2017 1149   NA 135 (L) 10/21/2016 1151   K 3.7 12/03/2020 1743   K 3.8 08/19/2017 1149   K 3.9 10/21/2016 1151   CL 99 12/03/2020 1743   CL 95 (L) 08/19/2017 1149   CO2 24 12/03/2020 1743   CO2 28 08/19/2017 1149   CO2 25 10/21/2016 1151   BUN 18 12/03/2020 1743   BUN 14 08/19/2017 1149   BUN 13.6 10/21/2016 1151   CREATININE 0.99 12/03/2020 1743   CREATININE 1.12 11/27/2020 0917   CREATININE 1.0 08/19/2017 1149   CREATININE 0.8 10/21/2016 1151      Component Value Date/Time   CALCIUM 9.7 12/03/2020 1743   CALCIUM 10.1 08/19/2017 1149   CALCIUM 10.3 10/21/2016 1151   ALKPHOS 55 12/03/2020 1743   ALKPHOS 68 08/19/2017 1149   ALKPHOS 68 10/21/2016 1151   AST 18 12/03/2020 1743   AST 14 (L) 11/27/2020 0917   AST 25 10/21/2016 1151   ALT 21 12/03/2020 1743   ALT 16 11/27/2020 0917   ALT 32 08/19/2017 1149   ALT 38 10/21/2016 1151   BILITOT 0.6 12/03/2020 1743   BILITOT 1.0 11/27/2020 0917   BILITOT 1.22 (H) 10/21/2016 1151       Impression and Plan: [name] is a very pleasant 80yo caucasian gentleman with polycythemia, JAK-2 negative. No phlebotomy needed, Hct 44.6%.  Follow-up in 2 months.  He can contact our office with any questions or concerns.   Laverna Peace, NP 5/6/202211:49 AM

## 2021-01-29 ENCOUNTER — Telehealth: Payer: Self-pay | Admitting: *Deleted

## 2021-01-29 LAB — IRON AND TIBC
Iron: 50 ug/dL (ref 42–163)
Saturation Ratios: 12 % — ABNORMAL LOW (ref 20–55)
TIBC: 405 ug/dL (ref 202–409)
UIBC: 355 ug/dL (ref 117–376)

## 2021-01-29 LAB — FERRITIN: Ferritin: 16 ng/mL — ABNORMAL LOW (ref 24–336)

## 2021-01-29 NOTE — Telephone Encounter (Signed)
Per 01/26/21 los - called and gave upcoming appointments - mailed calendar 

## 2021-01-30 ENCOUNTER — Other Ambulatory Visit: Payer: Self-pay

## 2021-01-30 ENCOUNTER — Ambulatory Visit: Payer: Medicare Other | Attending: Diagnostic Neuroimaging

## 2021-01-30 DIAGNOSIS — M545 Low back pain, unspecified: Secondary | ICD-10-CM

## 2021-01-30 DIAGNOSIS — M6281 Muscle weakness (generalized): Secondary | ICD-10-CM | POA: Diagnosis not present

## 2021-01-30 DIAGNOSIS — R2681 Unsteadiness on feet: Secondary | ICD-10-CM | POA: Diagnosis not present

## 2021-01-30 DIAGNOSIS — G8929 Other chronic pain: Secondary | ICD-10-CM | POA: Insufficient documentation

## 2021-01-30 NOTE — Therapy (Signed)
[facility] [address] [place], [place], [address] Phone: [phone]   Fax:  [phone]  Physical Therapy Treatment  Patient Details  Name: Jesse [name] MRN: [id number] Date of Birth: Jul 16, 1941 Referring Provider (PT): Andrey Spearman, MD   Encounter Date: 01/30/2021   PT End of Session - 01/30/21 1152    Visit Number 19    Number of Visits 22    Date for PT Re-Evaluation 03/19/21    Authorization Type UHC Medicare    PT Start Time 1100    PT Stop Time 1145    PT Time Calculation (min) 45 min    Activity Tolerance Patient tolerated treatment well    Behavior During Therapy [facility] for tasks assessed/performed           Past Medical History:  Diagnosis Date  . Depression   . Hypercholesteremia   . Hypertension   . HZ (herpes zoster) 09/02/2011  . Neuritis    left foot  . Polycythemia rubra vera ([place]) 09/02/2011    Past Surgical History:  Procedure Laterality Date  . GAS INSERTION  06/04/2012   Procedure: INSERTION OF GAS;  Surgeon: Hayden Pedro, MD;  Location: [place];  Service: Ophthalmology;  Laterality: Left;  C3F8  . SCLERAL BUCKLE  06/04/2012   Procedure: SCLERAL BUCKLE;  Surgeon: Hayden Pedro, MD;  Location: [place];  Service: Ophthalmology;  Laterality: Left;  Headscope laser    There were no vitals filed for this visit.   Subjective Assessment - 01/30/21 1104    Subjective Denies falls or med changes, main concern is nagging L peroneal pain    Pertinent History PMH: HTN    Limitations Walking    Diagnostic tests MRI from 10/24: 2.    At L4-L5, degenerative changes cause moderately severe left foraminal narrowing and severe left lateral recess stenosis with potential for left L4 and left L5 nerve root compression.  3.    At L5-S1, degenerative changes cause moderately severe right lateral recess stenosis with potential for right S1 nerve root compression.    Patient Stated Goals wants to get  rid of the pain.                             [facility] Adult PT Treatment/Exercise - 01/30/21 0001      Ambulation/Gait   Ambulation/Gait Yes    Ambulation/Gait Assistance 5: Supervision    Ambulation/Gait Assistance Details alt carry 10# KB to simulate groceries    Ambulation Distance (Feet) 200 Feet    Gait Pattern Step-through pattern    Ambulation Surface Level;Indoor    Stairs Yes    Stairs Assistance 5: Supervision    Number of Stairs 4    Gait Comments noted L peroneal pain following stair task      Self-Care   Self-Care Other Self-Care Comments    Other Self-Care Comments  discussed benfit of L shoe lift to equalize leg length discrepancy of shortened LLE, added 1/8" shoe lift to L shoe      Lumbar Exercises: Stretches   Lower Trunk Rotation 30 seconds;Limitations    Lower Trunk Rotation Limitations 2x30s ea. side      Lumbar Exercises: Supine   Ab Set 10 reps    AB Set Limitations 2x10 curl ups    Pelvic Tilt 10 reps    Pelvic Tilt Limitations 2x10    Bridge with Ball Squeeze Non-compliant;10 reps;Limitations  Bridge with [employer] Limitations 2x10    Other Supine Lumbar Exercises alt. marches with PT 2x10      Manual Therapy   Manual Therapy Soft tissue mobilization;Passive ROM    Manual therapy comments reassessment of SI alignment, ITB stretch, STM to L peroneals and L piriformis                    PT Short Term Goals - 09/19/20 1433      PT SHORT TERM GOAL #1   Title ALL STGS = LTGS             PT Long Term Goals - 01/18/21 2055      PT LONG TERM GOAL #1   Title Pt will be independent with final HEP in order to build upon functional gains made in therapy. ALL LTGS DUE 02/22/21    Baseline pt reports he has been performing - did not have time to formally review, will benefit from review/revisions as needed.    Time 5   due to delay in scheduling   Period Weeks    Status On-going    Target Date 02/22/21      PT LONG  TERM GOAL #2   Title Pt will report being able to perform standing activities >1 hour without low back pain in order to demonstrate improve tolerance for community activities.    Baseline unable to stand >1 hour due to pain    Time 5    Period Weeks    Status New      PT LONG TERM GOAL #3   Title Pt will improve L hip ABD strength to a 5/5 and hip extensor strength to a 4-/5 in order to demo improved hip strength for functional mobility and gait mechanics.    Baseline ABD = 4+/5, hip extensor at 3+/5    Time 5    Period Weeks    Status New      PT LONG TERM GOAL #4   Title Pt will undergo trial of a shoe lift for leg length discrepancy to see if it will address low back pain.    Time 5    Period Weeks    Status New      PT LONG TERM GOAL #5   Title Pt will subjectively report being able to lift and carry his groceries into his house without any pain.    Baseline low back pain and L lateral calf pain/pain over peroneals when performing    Time 5    Period Weeks    Status New                 Plan - 01/30/21 1133    Clinical Impression Statement Still IDs L peroneal pain as main complaint with L SI symptoms secondary, Point tender to L piriformis and symptoms reproduced with L ITB stretch and LTR, pelvic alignment appeared symmetric but leg length discrepancy remaining, added 1/8" lift to L shoe which he reported helpful, simulated grocery carry alt. UEs using 10# KB, added core stabilization exercises to maintain pelvic alignment and improve core strength and foundation    Personal Factors and Comorbidities Comorbidity 2;Past/Current Experience;Time since onset of injury/illness/exacerbation    Comorbidities PMH: HTN, Polycythemia rubra vera    Examination-Activity Limitations Locomotion Level;Stairs    Examination-Participation Restrictions Community Activity   playing golf   Stability/Clinical Decision Making Stable/Uncomplicated    Rehab Potential Good    PT Frequency 1x  / week  PT Duration 4 weeks    PT Treatment/Interventions ADLs/Self Care Home Management;Gait training;Stair training;Functional mobility training;Therapeutic activities;Therapeutic exercise;Balance training;Patient/family education;Neuromuscular re-education;Manual techniques;Passive range of motion    PT Next Visit Plan continue to assess pelvic alignment and benfit of shoe lift, continue L hip and ITB stretching, recheck L piriformis tenderness and stertch if appropriate    PT Home Exercise Plan [id number]    Consulted and Agree with Plan of Care Patient           Patient will benefit from skilled therapeutic intervention in order to improve the following deficits and impairments:  Decreased activity tolerance,Decreased range of motion,Decreased mobility,Decreased strength,Difficulty walking,Pain,Improper body mechanics,Impaired sensation,Increased muscle spasms,Hypomobility  Visit Diagnosis: Muscle weakness (generalized)  Unsteadiness on feet  Chronic left-sided low back pain without sciatica     Problem List Patient Active Problem List   Diagnosis Date Noted  . Need for prophylactic vaccination and inoculation against influenza 06/17/2016  . Polycythemia rubra vera ([place]) 09/02/2011  . HZ (herpes zoster) 09/02/2011    Lanice Shirts PT 01/30/2021, 12:43 PM  [facility] [address] [place], [place], [address] Phone: [phone]   Fax:  [phone]  Name: ABHIMANYU CRUCES MRN: [id number] Date of Birth: [birth year]

## 2021-01-30 NOTE — Patient Instructions (Signed)
Access Code: [email or web address] URL: [email or web address] Date: 01/30/2021 Prepared by: Sharlynn Oliphant  Exercises Supine Lower Trunk Rotation - 2 x daily - 7 x weekly - 2 sets - 30s hold

## 2021-02-06 ENCOUNTER — Ambulatory Visit: Payer: Medicare Other

## 2021-02-06 ENCOUNTER — Other Ambulatory Visit: Payer: Self-pay

## 2021-02-06 DIAGNOSIS — M545 Low back pain, unspecified: Secondary | ICD-10-CM

## 2021-02-06 DIAGNOSIS — G8929 Other chronic pain: Secondary | ICD-10-CM

## 2021-02-06 DIAGNOSIS — M6281 Muscle weakness (generalized): Secondary | ICD-10-CM

## 2021-02-06 DIAGNOSIS — R2681 Unsteadiness on feet: Secondary | ICD-10-CM

## 2021-02-06 NOTE — Therapy (Signed)
[facility] [address] [place], [place], [address] Phone: [phone]   Fax:  [phone]  Physical Therapy Treatment/10th Visit Progress Note  Patient Details  Name: Jesse [name] MRN: [id number] Date of Birth: 1941-07-12 Referring Provider (PT): Andrey Spearman, MD   Encounter Date: 02/06/2021   PT End of Session - 02/06/21 1314    Visit Number 20    Number of Visits 22    Date for PT Re-Evaluation 03/19/21    Authorization Type UHC Medicare    PT Start Time 1230    PT Stop Time 1315    PT Time Calculation (min) 45 min    Equipment Utilized During Treatment Gait belt    Activity Tolerance Patient tolerated treatment well    Behavior During Therapy [facility] for tasks assessed/performed           Past Medical History:  Diagnosis Date  . Depression   . Hypercholesteremia   . Hypertension   . HZ (herpes zoster) 09/02/2011  . Neuritis    left foot  . Polycythemia rubra vera ([place]) 09/02/2011    Past Surgical History:  Procedure Laterality Date  . GAS INSERTION  06/04/2012   Procedure: INSERTION OF GAS;  Surgeon: Hayden Pedro, MD;  Location: [place];  Service: Ophthalmology;  Laterality: Left;  C3F8  . SCLERAL BUCKLE  06/04/2012   Procedure: SCLERAL BUCKLE;  Surgeon: Hayden Pedro, MD;  Location: [place];  Service: Ophthalmology;  Laterality: Left;  Headscope laser    There were no vitals filed for this visit.   Subjective Assessment - 02/06/21 1246    Subjective Mild improvement in L SI discomfort, felt shoe lift helped, notes L peroneal pain wehn descending grades    Pertinent History PMH: HTN    Limitations Walking    Diagnostic tests MRI from 10/24: 2.    At L4-L5, degenerative changes cause moderately severe left foraminal narrowing and severe left lateral recess stenosis with potential for left L4 and left L5 nerve root compression.  3.    At L5-S1, degenerative changes cause moderately severe right  lateral recess stenosis with potential for right S1 nerve root compression.    Patient Stated Goals wants to get rid of the pain.                             [facility] Adult PT Treatment/Exercise - 02/06/21 0001      Lumbar Exercises: Stretches   Single Knee to Chest Stretch Right;Left;2 reps;30 seconds    Single Knee to Chest Stretch Limitations PT assisst    Piriformis Stretch 2 reps;30 seconds;Limitations    Piriformis Stretch Limitations PT assist    Figure 4 Stretch 2 reps;30 seconds;Without overpressure    Figure 4 Stretch Limitations PT assist      Lumbar Exercises: Aerobic   Nustep L3 8' arms 10      Lumbar Exercises: Supine   Ab Set 10 reps    AB Set Limitations 2x10 curl ups    Pelvic Tilt 10 reps    Pelvic Tilt Limitations 2x10    Single Leg Bridge Non-compliant;10 reps;Limitations    Bridge with Ball Squeeze Limitations 2x10      Knee/Hip Exercises: Sidelying   Hip ABduction Strengthening;Left;2 sets;10 reps                  PT Education - 02/06/21 1311    Education Details Reviewed HEP  and added stretches and L hip abd strengthening as well as B hip stretching    Person(s) Educated Patient    Methods Explanation;Demonstration;Tactile cues;Verbal cues;Handout            PT Short Term Goals - 09/19/20 1433      PT SHORT TERM GOAL #1   Title ALL STGS = LTGS             PT Long Term Goals - 02/06/21 1612      PT LONG TERM GOAL #1   Title Pt will be independent with final HEP in order to build upon functional gains made in therapy. ALL LTGS DUE 02/22/21    Baseline pt reports he has been performing - did not have time to formally review, will benefit from review/revisions as needed.: 02/06/21 HEP reviewed and updated    Time 5   due to delay in scheduling   Period Weeks    Status On-going      PT LONG TERM GOAL #2   Title Pt will report being able to perform standing activities >1 hour without low back pain in order to demonstrate  improve tolerance for community activities.    Baseline unable to stand >1 hour due to pain; 02/06/21 Reports a 20 min standing tolerance    Time 5    Period Weeks    Status New      PT LONG TERM GOAL #3   Title Pt will improve L hip ABD strength to a 5/5 and hip extensor strength to a 4-/5 in order to demo improved hip strength for functional mobility and gait mechanics.    Baseline ABD = 4+/5, hip extensor at 3+/5; 02/06/21 L hip abductio sterngth 3+/5, L hip extension 4-/5    Time 5    Period Weeks    Status On-going      PT LONG TERM GOAL #4   Title Pt will undergo trial of a shoe lift for leg length discrepancy to see if it will address low back pain.    Baseline 02/06/21 L shoe lift in place and subjective reports are positive    Time 5    Period Weeks    Status On-going      PT LONG TERM GOAL #5   Title Pt will subjectively report being able to lift and carry his groceries into his house without any pain.    Baseline low back pain and L lateral calf pain/pain over peroneals when performing; 02/06/21 Assess abilty to carry single UE loads of 10# on 01/30/21    Time 5    Period Weeks    Status On-going                 Plan - 02/06/21 1431    Clinical Impression Statement Patient presents as an elusive historian and has difficulty in idetifying and quantifying symptoms, L SI and L peroneal pain remain as main concerns.  Todays assessment identified B hip joint tightness as well as L hip abduction weakness and HEP was modified to account for these deficits.  Pelvic alignment appeared symmetrical.  STGs assessed and noted on regarding progress made    Personal Factors and Comorbidities Comorbidity 2;Past/Current Experience;Time since onset of injury/illness/exacerbation    Comorbidities PMH: HTN, Polycythemia rubra vera    Examination-Activity Limitations Locomotion Level;Stairs    Examination-Participation Restrictions Community Activity   playing golf   Stability/Clinical  Decision Making Stable/Uncomplicated    Rehab Potential Good    PT  Frequency 1x / week    PT Duration 4 weeks    PT Treatment/Interventions ADLs/Self Care Home Management;Gait training;Stair training;Functional mobility training;Therapeutic activities;Therapeutic exercise;Balance training;Patient/family education;Neuromuscular re-education;Manual techniques;Passive range of motion    PT Next Visit Plan f/u on new exercies and HEP, check hip joint flexibility    PT Home Exercise Plan [id number]    Consulted and Agree with Plan of Care Patient           Patient will benefit from skilled therapeutic intervention in order to improve the following deficits and impairments:  Decreased activity tolerance,Decreased range of motion,Decreased mobility,Decreased strength,Difficulty walking,Pain,Improper body mechanics,Impaired sensation,Increased muscle spasms,Hypomobility  Visit Diagnosis: Muscle weakness (generalized)  Unsteadiness on feet  Chronic left-sided low back pain without sciatica     Problem List Patient Active Problem List   Diagnosis Date Noted  . Need for prophylactic vaccination and inoculation against influenza 06/17/2016  . Polycythemia rubra vera ([place]) 09/02/2011  . HZ (herpes zoster) 09/02/2011    Lanice Shirts 02/06/2021, 4:19 PM  [facility] [address] [place], [place], [address] Phone: [phone]   Fax:  [phone]  Name: DONIVAN THAMMAVONG MRN: [id number] Date of Birth: 1941/05/23

## 2021-02-06 NOTE — Patient Instructions (Signed)
Access Code: [email or web address] URL: [email or web address] Date: 02/06/2021 Prepared by: Sharlynn Oliphant  Exercises Standing Lumbar Extension with Counter - 2 x daily - 7 x weekly - 1 sets - 3 reps - 30s hold Standing Gastroc Stretch at Counter - 2 x daily - 7 x weekly - 1 sets - 3 reps - 30s hold Supine Figure 4 Piriformis Stretch - 2 x daily - 7 x weekly - 1 sets - 2 reps - 30s hold Sidelying Hip Abduction - 2 x daily - 7 x weekly - 2 sets - 10 reps Figure 4 Bridge - 2 x daily - 7 x weekly - 2 sets - 10 reps

## 2021-02-13 ENCOUNTER — Ambulatory Visit: Payer: Medicare Other | Admitting: Physical Therapy

## 2021-02-13 DIAGNOSIS — R3914 Feeling of incomplete bladder emptying: Secondary | ICD-10-CM | POA: Diagnosis not present

## 2021-02-15 ENCOUNTER — Other Ambulatory Visit: Payer: Self-pay

## 2021-02-15 ENCOUNTER — Ambulatory Visit: Payer: Medicare Other

## 2021-02-15 DIAGNOSIS — M6281 Muscle weakness (generalized): Secondary | ICD-10-CM

## 2021-02-15 DIAGNOSIS — M545 Low back pain, unspecified: Secondary | ICD-10-CM | POA: Diagnosis not present

## 2021-02-15 DIAGNOSIS — G8929 Other chronic pain: Secondary | ICD-10-CM | POA: Diagnosis not present

## 2021-02-15 DIAGNOSIS — R2681 Unsteadiness on feet: Secondary | ICD-10-CM

## 2021-02-15 NOTE — Patient Instructions (Signed)
Access Code: [email or web address] URL: [email or web address] Date: 02/15/2021 Prepared by: Sharlynn Oliphant  Exercises Supine Figure 4 Piriformis Stretch - 2 x daily - 7 x weekly - 1 sets - 2 reps - 30s hold Sidelying Hip Abduction - 2 x daily - 7 x weekly - 2 sets - 10 reps Clamshell - 2 x daily - 7 x weekly - 2 sets - 10 reps Heel Toe Raises with Counter Support - 2 x daily - 7 x weekly - 2 sets - 10 reps

## 2021-02-15 NOTE — Therapy (Addendum)
[facility] [address] [place], [place], [address] Phone: [phone]   Fax:  [phone]  Physical Therapy Treatment  Patient Details  Name: Jesse [name] MRN: [id number] Date of Birth: 19-Dec-1940 Referring Provider (PT): Andrey Spearman, MD   Encounter Date: 02/15/2021  PHYSICAL THERAPY DISCHARGE SUMMARY  Visits from Start of Care: 21  Current functional level related to goals / functional outcomes: Goals partially met   Remaining deficits: Residual mild pain   Education / Equipment: HEP   Patient agrees to discharge. Patient goals were partially met. Patient is being discharged due to not returning since the last visit.    PT End of Session - 02/15/21 1111     Visit Number 21    Number of Visits 22    Date for PT Re-Evaluation 03/19/21    Authorization Type UHC Medicare    PT Start Time 8841    PT Stop Time 1100    PT Time Calculation (min) 45 min    Equipment Utilized During Treatment Gait belt    Activity Tolerance Patient tolerated treatment well    Behavior During Therapy [facility] for tasks assessed/performed             Past Medical History:  Diagnosis Date   Depression    Hypercholesteremia    Hypertension    HZ (herpes zoster) 09/02/2011   Neuritis    left foot   Polycythemia rubra vera ([place]) 09/02/2011    Past Surgical History:  Procedure Laterality Date   GAS INSERTION  06/04/2012   Procedure: INSERTION OF GAS;  Surgeon: Hayden Pedro, MD;  Location: [facility];  Service: Ophthalmology;  Laterality: Left;  C3F8   SCLERAL BUCKLE  06/04/2012   Procedure: SCLERAL BUCKLE;  Surgeon: Hayden Pedro, MD;  Location: [place];  Service: Ophthalmology;  Laterality: Left;  Headscope laser    There were no vitals filed for this visit.   Subjective Assessment - 02/15/21 1015     Subjective Reports mid improvement but still reports L SI and L peroneal pain    Pertinent History PMH: HTN     Limitations Walking    Diagnostic tests MRI from 10/24: 2.    At L4-L5, degenerative changes cause moderately severe left foraminal narrowing and severe left lateral recess stenosis with potential for left L4 and left L5 nerve root compression.  3.    At L5-S1, degenerative changes cause moderately severe right lateral recess stenosis with potential for right S1 nerve root compression.    Patient Stated Goals wants to get rid of the pain.                               [facility] Adult PT Treatment/Exercise - 02/15/21 0001       Self-Care   Self-Care Other Self-Care Comments    Other Self-Care Comments  discussed changes to HEP      Lumbar Exercises: Stretches   Active Hamstring Stretch Right;Left;1 rep;30 seconds;Limitations    Active Hamstring Stretch Limitations manual stertch    Single Knee to Chest Stretch Right;Left;1 rep;30 seconds;Limitations    Single Knee to Chest Stretch Limitations manual    Piriformis Stretch Left;3 reps;30 seconds;Limitations    Piriformis Stretch Limitations manual    Figure 4 Stretch 3 reps;30 seconds;Limitations    Figure 4 Stretch Limitations manaual L only      Lumbar Exercises: Aerobic   Nustep L2 6"  arms 8      Knee/Hip Exercises: Sidelying   Hip ABduction Strengthening;Left;3 sets;10 reps;Limitations    Hip ABduction Limitations performed in slight hip extension to facilitate gluteus medius      Manual Therapy   Manual Therapy Soft tissue mobilization    Manual therapy comments performed deep tissue mob and release of R pirifomis                    PT Education - 02/15/21 1110     Education Details see updated HEP    Person(s) Educated Patient    Methods Explanation;Demonstration;Tactile cues;Verbal cues;Handout    Comprehension Verbalized understanding;Returned demonstration;Need further instruction              PT Short Term Goals - 09/19/20 1433       PT SHORT TERM GOAL #1   Title ALL STGS = LTGS                PT Long Term Goals - 02/15/21 1114       PT LONG TERM GOAL #1   Title Pt will be independent with final HEP in order to build upon functional gains made in therapy. ALL LTGS DUE 02/22/21    Baseline pt reports he has been performing - did not have time to formally review, will benefit from review/revisions as needed.: 02/06/21 HEP reviewed and updated    Time 5   due to delay in scheduling   Period Weeks    Status On-going      PT LONG TERM GOAL #2   Title Pt will report being able to perform standing activities >1 hour without low back pain in order to demonstrate improve tolerance for community activities.    Baseline unable to stand >1 hour due to pain; 02/06/21 Reports a 20 min standing tolerance    Time 5    Period Weeks    Status On-going      PT LONG TERM GOAL #3   Title Pt will improve L hip ABD strength to a 5/5 and hip extensor strength to a 4-/5 in order to demo improved hip strength for functional mobility and gait mechanics.    Baseline ABD = 4+/5, hip extensor at 3+/5; 02/06/21 L hip abductio sterngth 3+/5, L hip extension 4-/5    Time 5    Period Weeks    Status On-going      PT LONG TERM GOAL #4   Title Pt will undergo trial of a shoe lift for leg length discrepancy to see if it will address low back pain.    Baseline 02/06/21 L shoe lift in place and subjective reports are positive    Time 5    Period Weeks    Status On-going      PT LONG TERM GOAL #5   Title Pt will subjectively report being able to lift and carry his groceries into his house without any pain.    Baseline low back pain and L lateral calf pain/pain over peroneals when performing; 02/06/21 Assess abilty to carry single UE loads of 10# on 01/30/21    Time 5    Period Weeks    Status On-going                   Plan - 02/15/21 1111     Clinical Impression Statement Patient reporting slight improvement in symptoms.  Assessment shows L piriformi strigger point and continued  L hip flexibility deficits as well  as L abductor weakness, magnified with gluteus medius isolation.    Personal Factors and Comorbidities Comorbidity 2;Past/Current Experience;Time since onset of injury/illness/exacerbation    Comorbidities PMH: HTN, Polycythemia rubra vera    Examination-Activity Limitations Locomotion Level;Stairs    Examination-Participation Restrictions Community Activity   playing golf   Stability/Clinical Decision Making Stable/Uncomplicated    Rehab Potential Good    PT Frequency 1x / week    PT Duration 4 weeks    PT Treatment/Interventions ADLs/Self Care Home Management;Gait training;Stair training;Functional mobility training;Therapeutic activities;Therapeutic exercise;Balance training;Patient/family education;Neuromuscular re-education;Manual techniques;Passive range of motion    PT Next Visit Plan recheck new HEP, continue piriformis STM and gluteus medius strengthening    PT Home Exercise Plan [id number]    Consulted and Agree with Plan of Care Patient             Patient will benefit from skilled therapeutic intervention in order to improve the following deficits and impairments:  Decreased activity tolerance,Decreased range of motion,Decreased mobility,Decreased strength,Difficulty walking,Pain,Improper body mechanics,Impaired sensation,Increased muscle spasms,Hypomobility  Visit Diagnosis: Muscle weakness (generalized)  Unsteadiness on feet  Chronic left-sided low back pain without sciatica     Problem List Patient Active Problem List   Diagnosis Date Noted   Need for prophylactic vaccination and inoculation against influenza 06/17/2016   Polycythemia rubra vera ([place]) 09/02/2011   HZ (herpes zoster) 09/02/2011    Lanice Shirts  PT 02/15/2021, 11:16 AM  [facility] [address] [place], [place], [address] Phone: [phone]   Fax:  [phone]  Name: Jesse [name] MRN:  [id number] Date of Birth: [birth year]

## 2021-02-19 ENCOUNTER — Encounter (HOSPITAL_COMMUNITY): Payer: Self-pay | Admitting: Emergency Medicine

## 2021-02-19 ENCOUNTER — Emergency Department (HOSPITAL_COMMUNITY): Payer: Medicare Other

## 2021-02-19 ENCOUNTER — Emergency Department (HOSPITAL_COMMUNITY)
Admission: EM | Admit: 2021-02-19 | Discharge: 2021-02-19 | Disposition: A | Discharge status: Home / Self Care | Payer: Medicare Other | Source: Home / Self Care | Attending: Emergency Medicine | Admitting: Emergency Medicine

## 2021-02-19 ENCOUNTER — Other Ambulatory Visit: Payer: Self-pay

## 2021-02-19 DIAGNOSIS — N281 Cyst of kidney, acquired: Secondary | ICD-10-CM | POA: Diagnosis not present

## 2021-02-19 DIAGNOSIS — Z7982 Long term (current) use of aspirin: Secondary | ICD-10-CM | POA: Insufficient documentation

## 2021-02-19 DIAGNOSIS — M47816 Spondylosis without myelopathy or radiculopathy, lumbar region: Secondary | ICD-10-CM | POA: Diagnosis not present

## 2021-02-19 DIAGNOSIS — E871 Hypo-osmolality and hyponatremia: Secondary | ICD-10-CM | POA: Diagnosis not present

## 2021-02-19 DIAGNOSIS — N3289 Other specified disorders of bladder: Secondary | ICD-10-CM | POA: Diagnosis not present

## 2021-02-19 DIAGNOSIS — Z79899 Other long term (current) drug therapy: Secondary | ICD-10-CM | POA: Insufficient documentation

## 2021-02-19 DIAGNOSIS — R1032 Left lower quadrant pain: Secondary | ICD-10-CM

## 2021-02-19 DIAGNOSIS — I10 Essential (primary) hypertension: Secondary | ICD-10-CM | POA: Insufficient documentation

## 2021-02-19 DIAGNOSIS — K5730 Diverticulosis of large intestine without perforation or abscess without bleeding: Secondary | ICD-10-CM | POA: Diagnosis not present

## 2021-02-19 LAB — CBC
HCT: 47.2 % (ref 39.0–52.0)
Hemoglobin: 15.2 g/dL (ref 13.0–17.0)
MCH: 27.7 pg (ref 26.0–34.0)
MCHC: 32.2 g/dL (ref 30.0–36.0)
MCV: 86.1 fL (ref 80.0–100.0)
Platelets: 307 10*3/uL (ref 150–400)
RBC: 5.48 MIL/uL (ref 4.22–5.81)
RDW: 16.5 % — ABNORMAL HIGH (ref 11.5–15.5)
WBC: 12.6 10*3/uL — ABNORMAL HIGH (ref 4.0–10.5)
nRBC: 0 % (ref 0.0–0.2)

## 2021-02-19 LAB — COMPREHENSIVE METABOLIC PANEL
ALT: 20 U/L (ref 0–44)
AST: 20 U/L (ref 15–41)
Albumin: 4 g/dL (ref 3.5–5.0)
Alkaline Phosphatase: 54 U/L (ref 38–126)
Anion gap: 9 (ref 5–15)
BUN: 17 mg/dL (ref 8–23)
CO2: 26 mmol/L (ref 22–32)
Calcium: 9.8 mg/dL (ref 8.9–10.3)
Chloride: 97 mmol/L — ABNORMAL LOW (ref 98–111)
Creatinine, Ser: 1.15 mg/dL (ref 0.61–1.24)
GFR, Estimated: >60 mL/min (ref >60)
Glucose, Bld: 139 mg/dL — ABNORMAL HIGH (ref 70–99)
Potassium: 3.7 mmol/L (ref 3.5–5.1)
Sodium: 132 mmol/L — ABNORMAL LOW (ref 135–145)
Total Bilirubin: 0.9 mg/dL (ref 0.3–1.2)
Total Protein: 7.1 g/dL (ref 6.5–8.1)

## 2021-02-19 LAB — URINALYSIS, ROUTINE W REFLEX MICROSCOPIC
Bilirubin Urine: NEGATIVE
Glucose, UA: NEGATIVE mg/dL
Hgb urine dipstick: NEGATIVE
Ketones, ur: 5 mg/dL — AB
Leukocytes,Ua: NEGATIVE
Nitrite: NEGATIVE
Protein, ur: NEGATIVE mg/dL
Specific Gravity, Urine: 1.01 (ref 1.005–1.030)
pH: 7 (ref 5.0–8.0)

## 2021-02-19 LAB — LIPASE, BLOOD: Lipase: 28 U/L (ref 11–51)

## 2021-02-19 MED ORDER — SODIUM CHLORIDE 0.9 % IV BOLUS
500.0000 mL | Freq: Once | INTRAVENOUS | Status: AC
  Administered 2021-02-19: 500 mL via INTRAVENOUS

## 2021-02-19 MED ORDER — MORPHINE SULFATE (PF) 2 MG/ML IV SOLN
2.0000 mg | Freq: Once | INTRAVENOUS | Status: AC
Start: 2021-02-19 — End: 2021-02-19
  Administered 2021-02-19: 2 mg via INTRAVENOUS
  Filled 2021-02-19: qty 1

## 2021-02-19 MED ORDER — MORPHINE SULFATE (PF) 2 MG/ML IV SOLN
2.0000 mg | Freq: Once | INTRAVENOUS | Status: AC
  Administered 2021-02-19: 2 mg via INTRAVENOUS
  Filled 2021-02-19: qty 1

## 2021-02-19 NOTE — ED Provider Notes (Signed)
[facility] DEPT Provider Note   CSN: [id number] Arrival date & time: 02/19/21  1539     History Chief Complaint  Patient presents with  . Abdominal Pain    Jesse [name] is a 80 y.o. male.  HPI 80 year old male who presents to the ER with complaints of left lower quadrant pain which started at around 1 PM today.  He has had a history of this in the past.  No nausea, vomiting, diarrhea, constipation, fevers, chills.  Patient states that he was here several months ago, but cannot remember what was the cause of his symptoms.  Per chart review, this appears that he had had hydronephrosis with a renal cyst.  He reports that he did follow-up with urology and was cleared by them.    Past Medical History:  Diagnosis Date  . Depression   . Hypercholesteremia   . Hypertension   . HZ (herpes zoster) 09/02/2011  . Neuritis    left foot  . Polycythemia rubra vera ([name]) 09/02/2011    Patient Active Problem List   Diagnosis Date Noted  . Need for prophylactic vaccination and inoculation against influenza 06/17/2016  . Polycythemia rubra vera ([place]) 09/02/2011  . HZ (herpes zoster) 09/02/2011    Past Surgical History:  Procedure Laterality Date  . GAS INSERTION  06/04/2012   Procedure: INSERTION OF GAS;  Surgeon: Hayden Pedro, MD;  Location: [facility];  Service: Ophthalmology;  Laterality: Left;  [id number]  . SCLERAL BUCKLE  06/04/2012   Procedure: SCLERAL BUCKLE;  Surgeon: Hayden Pedro, MD;  Location: [facility];  Service: Ophthalmology;  Laterality: Left;  Headscope laser       Family History  Problem Relation Age of Onset  . Heart attack Mother     Social History   Tobacco Use  . Smoking status: Never Smoker  . Smokeless tobacco: Never Used  . Tobacco comment: never used tobacco  Vaping Use  . Vaping Use: Never used  Substance Use Topics  . Alcohol use: No    Alcohol/week: 0.0 standard drinks  . Drug use: No    Home Medications Prior to  Admission medications   Medication Sig Start Date End Date Taking? Authorizing Provider  acetaminophen (TYLENOL) 325 MG tablet Take 325 mg by mouth every 6 (six) hours as needed for moderate pain.     [provider]  ALPRAZolam [name]) 1 MG tablet Take 0.5 mg by mouth at bedtime.  07/31/18   [provider]  aspirin EC 81 MG tablet Take 81 mg by mouth daily.    [provider]  COVID-19 mRNA Vac-TriS, Pfizer, SUSP injection USE AS DIRECTED 11/29/20 11/29/21  [name], MD  fish oil-omega-3 fatty acids 1000 MG capsule Take 1 g by mouth daily.    [provider]  gabapentin (NEURONTIN) 100 MG capsule Take 100 mg by mouth 3 (three) times daily. 03/17/20   [provider]  lisinopril-hydrochlorothiazide (PRINZIDE,ZESTORETIC) 20-12.5 MG per tablet Take 1 tablet by mouth daily.    [provider]  ondansetron (ZOFRAN ODT) 4 MG disintegrating tablet Take 1 tablet (4 mg total) by mouth every 8 (eight) hours as needed for up to 15 doses for nausea or vomiting. 02/28/20   [name], MD  simvastatin (ZOCOR) 20 MG tablet Take 20 mg by mouth daily at 6 PM.  02/20/19   [provider]  traMADol (ULTRAM) 50 MG tablet Take 50 mg by mouth 2 (two) times daily as needed.  02/27/20   [provider]  vitamin E 1000 UNIT capsule Take 1,000 Units by mouth daily. 11/18/19   [provider]    Allergies    Tramadol  Review of Systems   Review of Systems  Constitutional: Negative for chills and fever.  HENT: Negative for ear pain and sore throat.   Eyes: Negative for pain and visual disturbance.  Respiratory: Negative for cough and shortness of breath.   Cardiovascular: Negative for chest pain and palpitations.  Gastrointestinal: Positive for abdominal pain. Negative for vomiting.  Genitourinary: Negative for dysuria and hematuria.  Musculoskeletal: Negative for arthralgias and back pain.  Skin: Negative for color change and rash.   Neurological: Negative for seizures and syncope.  All other systems reviewed and are negative.   Physical Exam Updated Vital Signs BP 135/79   Pulse 90   Temp 98.4 F (36.9 C) (Oral)   Resp 18   Ht 5\' 6"  (1.676 m)   Wt 72.1 kg   SpO2 97%   BMI 25.66 kg/m   Physical Exam Vitals and nursing note reviewed.  Constitutional:      Appearance: He is well-developed.  HENT:     Head: Normocephalic and atraumatic.  Eyes:     Conjunctiva/sclera: Conjunctivae normal.  Cardiovascular:     Rate and Rhythm: Normal rate and regular rhythm.     Heart sounds: No murmur heard.   Pulmonary:     Effort: Pulmonary effort is normal. No respiratory distress.     Breath sounds: Normal breath sounds.  Abdominal:     Palpations: Abdomen is soft.     Tenderness: There is abdominal tenderness in the left lower quadrant. There is no right CVA tenderness or left CVA tenderness.     Hernia: No hernia is present.  Musculoskeletal:     Cervical back: Neck supple.  Skin:    General: Skin is warm and dry.  Neurological:     General: No focal deficit present.     Mental Status: He is alert.  Psychiatric:        Mood and Affect: Mood normal.        Behavior: Behavior normal.     ED Results / Procedures / Treatments   Labs (all labs ordered are listed, but only abnormal results are displayed) Labs Reviewed  COMPREHENSIVE METABOLIC PANEL - Abnormal; Notable for the following components:      Result Value   Sodium 132 (*)    Chloride 97 (*)    Glucose, Bld 139 (*)    All other components within normal limits  CBC - Abnormal; Notable for the following components:   WBC 12.6 (*)    RDW 16.5 (*)    All other components within normal limits  URINALYSIS, ROUTINE W REFLEX MICROSCOPIC - Abnormal; Notable for the following components:   Ketones, ur 5 (*)    All other components within normal limits  LIPASE, BLOOD    EKG None  Radiology CT ABDOMEN PELVIS WO CONTRAST  Result Date:  02/19/2021 CLINICAL DATA:  Left lower quadrant pain for several hours, initial encounter EXAM: CT ABDOMEN AND PELVIS WITHOUT CONTRAST TECHNIQUE: Multidetector CT imaging of the abdomen and pelvis was performed following the standard protocol without IV contrast. COMPARISON:  12/03/2020 FINDINGS: Lower chest: Lung bases are free of acute infiltrate or sizable effusion. Hepatobiliary: No focal liver abnormality is seen. No gallstones, gallbladder wall thickening, or biliary dilatation. Pancreas: Unremarkable. No pancreatic ductal dilatation or surrounding inflammatory changes. Spleen: Normal  in size without focal abnormality. Adrenals/Urinary Tract: Adrenal glands are within normal limits. Right kidney demonstrates some mild perinephric stranding stable from the prior study. No obstructive changes are seen. The left kidney demonstrates some cortical cysts stable from the prior exam. Significant perinephric stranding is noted similar to that seen on the prior exam. No calculi are noted. The collecting system is prominent with a prominent extrarenal pelvis identified. This was reported as prominent cyst in the renal sinus on prior exam however a given the appearance this is felt to represent dilated renal pelvis. These changes have the appearance of a chronic UPJ obstruction. The ureter is well visualized and within normal limits. No calculi are seen. The bladder is well distended. Stomach/Bowel: Mild diverticular change of the colon is noted. No obstructive or inflammatory changes of the colon are noted. The appendix is well visualized and within normal limits. Small bowel and stomach appear unremarkable. Vascular/Lymphatic: Aortic atherosclerosis. No enlarged abdominal or pelvic lymph nodes. Reproductive: Prostate is unremarkable. Other: No abdominal wall hernia or abnormality. No abdominopelvic ascites. Musculoskeletal: Degenerative changes of the lumbar spine are noted. IMPRESSION: Findings in the left kidney most  consistent with a chronic UPJ obstruction. The overall appearance is similar to that seen on the prior exam. This has been previously reported as a large renal pelvic cyst although the collecting system inter communicates with this consistent with hydronephrosis. Distal ureter on the left is within normal limits. Diverticulosis without diverticulitis. No other focal abnormality is noted. Electronically Signed   By: Inez Catalina M.D.   On: 02/19/2021 16:58    Procedures Procedures   Medications Ordered in ED Medications  morphine 2 MG/ML injection 2 mg (2 mg Intravenous Given 02/19/21 1732)  sodium chloride 0.9 % bolus 500 mL (0 mLs Intravenous Stopped 02/19/21 1831)  morphine 2 MG/ML injection 2 mg (2 mg Intravenous Given 02/19/21 1831)    ED Course  I have reviewed the triage vital signs and the nursing notes.  Pertinent labs & imaging results that were available during my care of the patient were reviewed by me and considered in my medical decision making (see chart for details).    MDM Rules/Calculators/A&P                          80 year old male presents to the ER with left lower quadrant pain.  On arrival, he is well-appearing, no acute distress, resting comfortably in ER bed.  Vitals reassuring.  Physical exam with left lower quadrant tenderness, no CVA tenderness, no peritoneal signs.  No guarding.  DDx includes possible repeat hydronephrosis versus diverticulitis.  Labs and imaging ordered, reviewed and interpreted by me.  CBC with leukocytosis of 12.6, CMP with mild hyponatremia 132, no other significant electrolyte abnormalities noted, normal renal and LFT function.  Normal lipase.  UA without evidence of UTI.  CT of the abdomen and pelvis with  IMPRESSION:  Findings in the left kidney most consistent with a chronic UPJ  obstruction. The overall appearance is similar to that seen on the  prior exam. This has been previously reported as a large renal  pelvic cyst although the  collecting system inter communicates with  this consistent with hydronephrosis. Distal ureter on the left is  within normal limits.    Diverticulosis without diverticulitis.    No other focal abnormality is noted.       Patient received 4 mg of morphine with resolution of his symptoms.  I  did encourage him to follow-up again with urology and his PCP.  No acute emergent findings/causes of his pain.  He was understanding and is agreeable.  Turn precautions discussed.  Stable for discharge.  This was a shared visit with my supervising physician Dr. Ronnald Nian who independently saw and evaluated the patient & provided guidance in evaluation/management/disposition ,in agreement with care  Final Clinical Impression(s) / ED Diagnoses Final diagnoses:  None    Rx / DC Orders ED Discharge Orders    None       Garald Balding, PA-C 02/19/21 [place], [place], DO 02/19/21 2154

## 2021-02-19 NOTE — ED Triage Notes (Signed)
Pt states he had sudden onset lower abdominal pain in his left lower quadrant that began around 1pm today. Pt states he had the same pain 3 months ago and was seen at [facility] for it, but that he does not remember what the pain was. Pt denies nausea, vomiting, and diarrhea.

## 2021-02-19 NOTE — Discharge Instructions (Signed)
Your work-up today was overall reassuring.  Your CT scan showed that you a chronic UPJ obstruction, which is a narrowing in the area that connects the renal pelvis (part of the kidney) to one of the tubes (ureteres) that move urine to the bladder. On the previous scan this was documented as a cyst on your kidney. This sometime can be congenital. Please follow up with urology about this.  This is not emergent and not life-threatening.  Please return to the ER for any new or worsening symptoms.

## 2021-02-19 NOTE — ED Provider Notes (Signed)
Emergency Medicine Provider Triage Evaluation Note  Jesse [name] , a 80 y.o. male  was evaluated in triage.  Pt complains of sudden onset, constant, sharp, LLQ abdominal pain that began around 1 PM today. Hx of same in the past. No nausea, vomiting, diarrhea, constipation, fevers, chills. Per chart review pt seen for same in March and found to have hydronephrosis with renal cyst.  Review of Systems  Positive: + LLQ abd pain Negative: - fevers, chills, N/V/D  Physical Exam  BP 136/86   Pulse 93   Temp 98.4 F (36.9 C) (Oral)   Resp 18   Ht 5\' 6"  (1.676 m)   Wt 72.1 kg   SpO2 92%   BMI 25.66 kg/m  Gen:   Awake, no distress   Resp:  Normal effort  MSK:   Moves extremities without difficulty  Other:  + LLQ abdominal TTP. No CVA TTP.   Medical Decision Making  Medically screening exam initiated at 4:18 PM.  Appropriate orders placed.  Jesse [name] was informed that the remainder of the evaluation will be completed by another provider, this initial triage assessment does not replace that evaluation, and the importance of remaining in the ED until their evaluation is complete.  Abdominal labs ordered at this time. Question repeat hydronephrosis vs diverticulitis? Stable at this time.    Eustaquio Maize, PA-C 02/19/21 1619    Davonna Belling, MD 02/19/21 [phone]

## 2021-02-20 ENCOUNTER — Ambulatory Visit: Payer: Medicare Other

## 2021-02-20 DIAGNOSIS — K5790 Diverticulosis of intestine, part unspecified, without perforation or abscess without bleeding: Secondary | ICD-10-CM | POA: Diagnosis not present

## 2021-02-20 DIAGNOSIS — R1032 Left lower quadrant pain: Secondary | ICD-10-CM | POA: Diagnosis not present

## 2021-02-20 DIAGNOSIS — M519 Unspecified thoracic, thoracolumbar and lumbosacral intervertebral disc disorder: Secondary | ICD-10-CM | POA: Diagnosis not present

## 2021-02-22 ENCOUNTER — Other Ambulatory Visit: Payer: Self-pay

## 2021-02-22 ENCOUNTER — Inpatient Hospital Stay (HOSPITAL_COMMUNITY)
Admission: EM | Admit: 2021-02-22 | Discharge: 2021-02-27 | DRG: 641 | Disposition: A | Discharge status: Skilled Nursing Facility | Payer: Medicare Other | Attending: Internal Medicine | Admitting: Internal Medicine

## 2021-02-22 ENCOUNTER — Emergency Department (HOSPITAL_COMMUNITY): Payer: Medicare Other

## 2021-02-22 ENCOUNTER — Encounter (HOSPITAL_COMMUNITY): Payer: Self-pay | Admitting: Emergency Medicine

## 2021-02-22 DIAGNOSIS — E876 Hypokalemia: Secondary | ICD-10-CM | POA: Diagnosis not present

## 2021-02-22 DIAGNOSIS — G4700 Insomnia, unspecified: Secondary | ICD-10-CM | POA: Diagnosis not present

## 2021-02-22 DIAGNOSIS — Y92009 Unspecified place in unspecified non-institutional (private) residence as the place of occurrence of the external cause: Secondary | ICD-10-CM | POA: Diagnosis not present

## 2021-02-22 DIAGNOSIS — R197 Diarrhea, unspecified: Secondary | ICD-10-CM | POA: Diagnosis not present

## 2021-02-22 DIAGNOSIS — Z20822 Contact with and (suspected) exposure to covid-19: Secondary | ICD-10-CM | POA: Diagnosis present

## 2021-02-22 DIAGNOSIS — W19XXXA Unspecified fall, initial encounter: Secondary | ICD-10-CM | POA: Diagnosis present

## 2021-02-22 DIAGNOSIS — S0990XA Unspecified injury of head, initial encounter: Secondary | ICD-10-CM | POA: Diagnosis not present

## 2021-02-22 DIAGNOSIS — D45 Polycythemia vera: Secondary | ICD-10-CM | POA: Diagnosis not present

## 2021-02-22 DIAGNOSIS — I10 Essential (primary) hypertension: Secondary | ICD-10-CM | POA: Diagnosis present

## 2021-02-22 DIAGNOSIS — R2681 Unsteadiness on feet: Secondary | ICD-10-CM | POA: Diagnosis not present

## 2021-02-22 DIAGNOSIS — M47812 Spondylosis without myelopathy or radiculopathy, cervical region: Secondary | ICD-10-CM | POA: Diagnosis not present

## 2021-02-22 DIAGNOSIS — R1032 Left lower quadrant pain: Secondary | ICD-10-CM | POA: Diagnosis present

## 2021-02-22 DIAGNOSIS — Z885 Allergy status to narcotic agent status: Secondary | ICD-10-CM

## 2021-02-22 DIAGNOSIS — Z9889 Other specified postprocedural states: Secondary | ICD-10-CM | POA: Diagnosis not present

## 2021-02-22 DIAGNOSIS — F32A Depression, unspecified: Secondary | ICD-10-CM | POA: Diagnosis present

## 2021-02-22 DIAGNOSIS — M6281 Muscle weakness (generalized): Secondary | ICD-10-CM | POA: Diagnosis not present

## 2021-02-22 DIAGNOSIS — Z7982 Long term (current) use of aspirin: Secondary | ICD-10-CM | POA: Diagnosis not present

## 2021-02-22 DIAGNOSIS — E785 Hyperlipidemia, unspecified: Secondary | ICD-10-CM | POA: Diagnosis not present

## 2021-02-22 DIAGNOSIS — M25561 Pain in right knee: Secondary | ICD-10-CM | POA: Diagnosis not present

## 2021-02-22 DIAGNOSIS — S199XXA Unspecified injury of neck, initial encounter: Secondary | ICD-10-CM | POA: Diagnosis not present

## 2021-02-22 DIAGNOSIS — W19XXXD Unspecified fall, subsequent encounter: Secondary | ICD-10-CM

## 2021-02-22 DIAGNOSIS — E871 Hypo-osmolality and hyponatremia: Secondary | ICD-10-CM | POA: Diagnosis not present

## 2021-02-22 DIAGNOSIS — J069 Acute upper respiratory infection, unspecified: Secondary | ICD-10-CM | POA: Diagnosis not present

## 2021-02-22 DIAGNOSIS — R531 Weakness: Secondary | ICD-10-CM | POA: Diagnosis not present

## 2021-02-22 DIAGNOSIS — Z743 Need for continuous supervision: Secondary | ICD-10-CM | POA: Diagnosis not present

## 2021-02-22 DIAGNOSIS — E7800 Pure hypercholesterolemia, unspecified: Secondary | ICD-10-CM | POA: Diagnosis not present

## 2021-02-22 DIAGNOSIS — Z888 Allergy status to other drugs, medicaments and biological substances status: Secondary | ICD-10-CM | POA: Diagnosis not present

## 2021-02-22 DIAGNOSIS — E860 Dehydration: Secondary | ICD-10-CM | POA: Diagnosis present

## 2021-02-22 DIAGNOSIS — Z8249 Family history of ischemic heart disease and other diseases of the circulatory system: Secondary | ICD-10-CM | POA: Diagnosis not present

## 2021-02-22 DIAGNOSIS — M11262 Other chondrocalcinosis, left knee: Secondary | ICD-10-CM | POA: Diagnosis not present

## 2021-02-22 DIAGNOSIS — Z79899 Other long term (current) drug therapy: Secondary | ICD-10-CM | POA: Diagnosis not present

## 2021-02-22 DIAGNOSIS — R279 Unspecified lack of coordination: Secondary | ICD-10-CM | POA: Diagnosis not present

## 2021-02-22 DIAGNOSIS — R1312 Dysphagia, oropharyngeal phase: Secondary | ICD-10-CM | POA: Diagnosis not present

## 2021-02-22 LAB — CBC WITH DIFFERENTIAL/PLATELET
Abs Immature Granulocytes: 0.11 10*3/uL — ABNORMAL HIGH (ref 0.00–0.07)
Basophils Absolute: 0 10*3/uL (ref 0.0–0.1)
Basophils Relative: 0 %
Eosinophils Absolute: 0 10*3/uL (ref 0.0–0.5)
Eosinophils Relative: 0 %
HCT: 42.7 % (ref 39.0–52.0)
Hemoglobin: 14.2 g/dL (ref 13.0–17.0)
Immature Granulocytes: 1 %
Lymphocytes Relative: 6 %
Lymphs Abs: 1.1 10*3/uL (ref 0.7–4.0)
MCH: 27.9 pg (ref 26.0–34.0)
MCHC: 33.3 g/dL (ref 30.0–36.0)
MCV: 83.9 fL (ref 80.0–100.0)
Monocytes Absolute: 2.5 10*3/uL — ABNORMAL HIGH (ref 0.1–1.0)
Monocytes Relative: 14 %
Neutro Abs: 14.5 10*3/uL — ABNORMAL HIGH (ref 1.7–7.7)
Neutrophils Relative %: 79 %
Platelets: 151 10*3/uL (ref 150–400)
RBC: 5.09 MIL/uL (ref 4.22–5.81)
RDW: 15.6 % — ABNORMAL HIGH (ref 11.5–15.5)
WBC: 18.2 10*3/uL — ABNORMAL HIGH (ref 4.0–10.5)
nRBC: 0 % (ref 0.0–0.2)

## 2021-02-22 LAB — BASIC METABOLIC PANEL
Anion gap: 12 (ref 5–15)
Anion gap: 13 (ref 5–15)
BUN: 24 mg/dL — ABNORMAL HIGH (ref 8–23)
BUN: 23 mg/dL (ref 8–23)
CO2: 18 mmol/L — ABNORMAL LOW (ref 22–32)
CO2: 20 mmol/L — ABNORMAL LOW (ref 22–32)
Calcium: 8.7 mg/dL — ABNORMAL LOW (ref 8.9–10.3)
Calcium: 8.9 mg/dL (ref 8.9–10.3)
Chloride: 81 mmol/L — ABNORMAL LOW (ref 98–111)
Chloride: 79 mmol/L — ABNORMAL LOW (ref 98–111)
Creatinine, Ser: 1.08 mg/dL (ref 0.61–1.24)
Creatinine, Ser: 1.04 mg/dL (ref 0.61–1.24)
GFR, Estimated: >60 mL/min (ref >60)
GFR, Estimated: >60 mL/min (ref >60)
Glucose, Bld: 96 mg/dL (ref 70–99)
Glucose, Bld: 96 mg/dL (ref 70–99)
Potassium: 4 mmol/L (ref 3.5–5.1)
Potassium: 3.6 mmol/L (ref 3.5–5.1)
Sodium: 111 mmol/L — CL (ref 135–145)
Sodium: 112 mmol/L — CL (ref 135–145)

## 2021-02-22 LAB — RESP PANEL BY RT-PCR (FLU A&B, COVID) ARPGX2
Influenza A by PCR: NEGATIVE
Influenza B by PCR: NEGATIVE
SARS Coronavirus 2 by RT PCR: NEGATIVE

## 2021-02-22 MED ORDER — SODIUM CHLORIDE 0.9 % IV BOLUS
1000.0000 mL | Freq: Once | INTRAVENOUS | Status: AC
  Administered 2021-02-22: 1000 mL via INTRAVENOUS

## 2021-02-22 MED ORDER — SODIUM CHLORIDE 3 % IV SOLN
INTRAVENOUS | Status: DC
  Filled 2021-02-22 (×2): qty 500

## 2021-02-22 NOTE — ED Notes (Signed)
Please contact sister, Jimmy Footman, who brought patient in today. She is concerned and wants to relay to you that patient has been confused and falling, kidney infection recently, etc. (she can relay the extent of it when called). Her number [phone].

## 2021-02-22 NOTE — ED Provider Notes (Signed)
Emergency Medicine Provider Triage Evaluation Note  Jesse [name] , a 80 y.o. male  was evaluated in triage.  Pt complains of bilateral knee pain after fall that occurred today.  States that he was walking down the steps when he fell and landed on his knees.  States that both of his knees hurt.  Family member also reports that he has been having frequent falls and confusion over the past few days.  He denies any chest pain or vomiting.  Review of Systems  Positive: Bilateral knee pain, weakness Negative: Chest pain, vomiting  Physical Exam  BP (!) 117/58 (BP Location: Right Arm)   Pulse 65   Temp 98.7 F (37.1 C) (Oral)   Resp 17   SpO2 95%  Gen:   Awake, no distress Resp:  Normal effort MSK:   Moves extremities without difficulty Other:  Normal range of motion of bilateral knees, no facial asymmetry noted  Medical Decision Making  Medically screening exam initiated at 5:44 PM.  Appropriate orders placed.  Jesse [name] was informed that the remainder of the evaluation will be completed by another provider, this initial triage assessment does not replace that evaluation, and the importance of remaining in the ED until their evaluation is complete.  Lab work, urinalysis and x-rays ordered   Delia Heady, Hershal Coria 02/22/21 1745    Blanchie Dessert, MD 02/23/21 [phone]

## 2021-02-22 NOTE — ED Triage Notes (Signed)
Pt reports falling forward onto both knees today, reporting some knee pain. Pt does not take blood thinners. Denies hitting head. Family concerned for pt having increased falls with confusion. Pt A&Ox4 in triage.

## 2021-02-22 NOTE — ED Provider Notes (Signed)
[facility] EMERGENCY DEPARTMENT Provider Note   CSN: [id number] Arrival date & time: 02/22/21  1703     History Chief Complaint  Patient presents with  . Fall    Jesse [name] is a 80 y.o. male with PMH/o Depression, HCTZ who presents for evaluation of fall, generalized weakness.  Patient reports that today, he fell.  He is unsure why exactly he fell but states he did not have any chest pain or preceding dizziness that caused his fall.  He states he landed on his knees but also endorses hitting the back of his head.  No LOC.  He is not on blood thinners.  He states he has been able to ambulate since this happened but feels very weak, tired.  He has had some nausea/vomiting.  He denies any chest pain, neck pain, abdominal pain, numbness/weakness of his arms or legs.  The history is provided by the patient.       Past Medical History:  Diagnosis Date  . Depression   . Hypercholesteremia   . Hypertension   . HZ (herpes zoster) 09/02/2011  . Neuritis    left foot  . Polycythemia rubra vera ([place]) 09/02/2011    Patient Active Problem List   Diagnosis Date Noted  . Hyponatremia 02/22/2021  . Essential hypertension 02/22/2021  . Hyperlipidemia 02/22/2021  . Fall at home, initial encounter 02/22/2021  . Need for prophylactic vaccination and inoculation against influenza 06/17/2016  . Polycythemia rubra vera ([place]) 09/02/2011  . HZ (herpes zoster) 09/02/2011    Past Surgical History:  Procedure Laterality Date  . GAS INSERTION  06/04/2012   Procedure: INSERTION OF GAS;  Surgeon: Hayden Pedro, MD;  Location: [facility];  Service: Ophthalmology;  Laterality: Left;  [id number]  . SCLERAL BUCKLE  06/04/2012   Procedure: SCLERAL BUCKLE;  Surgeon: Hayden Pedro, MD;  Location: [facility];  Service: Ophthalmology;  Laterality: Left;  Headscope laser       Family History  Problem Relation Age of Onset  . Heart attack Mother     Social History   Tobacco Use  . Smoking  status: Never Smoker  . Smokeless tobacco: Never Used  . Tobacco comment: never used tobacco  Vaping Use  . Vaping Use: Never used  Substance Use Topics  . Alcohol use: No    Alcohol/week: 0.0 standard drinks  . Drug use: No    Home Medications Prior to Admission medications   Medication Sig Start Date End Date Taking? Authorizing Provider  ALPRAZolam [name]) 1 MG tablet Take 0.5 mg by mouth at bedtime.  07/31/18  Yes [provider]  aspirin EC 81 MG tablet Take 81 mg by mouth daily.   Yes [provider]  COVID-19 mRNA Vac-TriS, Pfizer, SUSP injection USE AS DIRECTED 11/29/20 11/29/21 Yes [name], MD  fish oil-omega-3 fatty acids 1000 MG capsule Take 1 g by mouth daily.   Yes [provider]  lisinopril-hydrochlorothiazide (PRINZIDE,ZESTORETIC) 20-12.5 MG per tablet Take 1 tablet by mouth daily.   Yes [provider]  ondansetron (ZOFRAN ODT) 4 MG disintegrating tablet Take 1 tablet (4 mg total) by mouth every 8 (eight) hours as needed for up to 15 doses for nausea or vomiting. 02/28/20  Yes [name], MD  simvastatin (ZOCOR) 20 MG tablet Take 20 mg by mouth daily at 6 PM.  02/20/19  Yes [provider]  vitamin E 1000 UNIT capsule Take 1,000 Units by mouth daily. 11/18/19  Yes  [provider]    Allergies    Tramadol  Review of Systems   Review of Systems  Constitutional: Negative for fever.  Respiratory: Negative for cough and shortness of breath.   Cardiovascular: Negative for chest pain.  Gastrointestinal: Negative for abdominal pain, nausea and vomiting.  Genitourinary: Negative for dysuria and hematuria.  Musculoskeletal:       Bilateral knee pain  Neurological: Positive for weakness (generalized). Negative for headaches.  All other systems reviewed and are negative.   Physical Exam Updated Vital Signs BP 122/90   Pulse 81   Temp 98.7 F (37.1 C) (Oral)   Resp (!) 23   Ht 5' 5.5" (1.664 m)   Wt 72.1  kg   SpO2 96%   BMI 26.06 kg/m   Physical Exam Vitals and nursing note reviewed.  Constitutional:      Appearance: Normal appearance. He is well-developed.  HENT:     Head: Normocephalic and atraumatic.      Comments: Tenderness palpation of the posterior aspect of the skull.  No underlying deformity or crepitus noted. Eyes:     General: Lids are normal.     Conjunctiva/sclera: Conjunctivae normal.     Pupils: Pupils are equal, round, and reactive to light.     Comments: PERRL. EOMs intact. No nystagmus. No neglect.   Neck:     Comments: Full flexion/extension and lateral movement of neck fully intact. No bony midline tenderness. No deformities or crepitus.  Cardiovascular:     Rate and Rhythm: Normal rate and regular rhythm.     Pulses: Normal pulses.          Radial pulses are 2+ on the right side and 2+ on the left side.       Dorsalis pedis pulses are 2+ on the right side and 2+ on the left side.     Heart sounds: Normal heart sounds. No murmur heard. No friction rub. No gallop.   Pulmonary:     Effort: Pulmonary effort is normal.     Breath sounds: Normal breath sounds.     Comments: Lungs clear to auscultation bilaterally.  Symmetric chest rise.  No wheezing, rales, rhonchi. Abdominal:     Palpations: Abdomen is soft. Abdomen is not rigid.     Tenderness: There is no abdominal tenderness. There is no guarding.     Comments: Abdomen is soft, non-distended, non-tender. No rigidity, No guarding. No peritoneal signs.  Musculoskeletal:        General: Normal range of motion.     Cervical back: Full passive range of motion without pain.     Comments: Tenderness palpation in anterior aspects of bilateral knees.  No overlying warmth, erythema, edema.  No deformity or crepitus noted.  Flexion/tension of bilateral lower extremities intact by difficulty.  Skin:    General: Skin is warm and dry.     Capillary Refill: Capillary refill takes less than 2 seconds.  Neurological:      Mental Status: He is alert and oriented to person, place, and time.     Comments: Cranial nerves III-XII intact Follows commands, Moves all extremities  5/5 strength to BUE and BLE  Sensation intact throughout all major nerve distributions No slurred speech. No facial droop.  Alert and oriented x 3  Psychiatric:        Speech: Speech normal.     ED Results / Procedures / Treatments   Labs (all labs ordered are listed, but only abnormal results are displayed)  Labs Reviewed  BASIC METABOLIC PANEL - Abnormal; Notable for the following components:      Result Value   Sodium 111 (*)    Chloride 81 (*)    CO2 18 (*)    BUN 24 (*)    Calcium 8.7 (*)    All other components within normal limits  CBC WITH DIFFERENTIAL/PLATELET - Abnormal; Notable for the following components:   WBC 18.2 (*)    RDW 15.6 (*)    Neutro Abs 14.5 (*)    Monocytes Absolute 2.5 (*)    Abs Immature Granulocytes 0.11 (*)    All other components within normal limits  BASIC METABOLIC PANEL - Abnormal; Notable for the following components:   Sodium 112 (*)    Chloride 79 (*)    CO2 20 (*)    All other components within normal limits  RESP PANEL BY RT-PCR (FLU A&B, COVID) ARPGX2  URINALYSIS, ROUTINE W REFLEX MICROSCOPIC  OSMOLALITY  OSMOLALITY, URINE  SODIUM, URINE, RANDOM  SODIUM  SODIUM    EKG EKG Interpretation  Date/Time:  Thursday February 22 2021 17:53:00 EDT Ventricular Rate:  82 PR Interval:  164 QRS Duration: 90 QT Interval:  376 QTC Calculation: 439 R Axis:   29 Text Interpretation: Normal sinus rhythm Normal ECG No significant change since last tracing Confirmed by Isla Pence [phone]) on 02/22/2021 7:50:39 PM   Radiology DG Chest 2 View  Result Date: 02/22/2021 CLINICAL DATA:  Fall, altered mental status EXAM: CHEST - 2 VIEW COMPARISON:  10/15/2014 FINDINGS: Heart and mediastinal contours are within normal limits. No focal opacities or effusions. No acute bony abnormality. Aortic  atherosclerosis. IMPRESSION: No active cardiopulmonary disease. Electronically Signed   By: Rolm Baptise M.D.   On: 02/22/2021 18:41   CT Head Wo Contrast  Result Date: 02/22/2021 CLINICAL DATA:  Head and neck trauma, fell EXAM: CT HEAD WITHOUT CONTRAST TECHNIQUE: Contiguous axial images were obtained from the base of the skull through the vertex without intravenous contrast. COMPARISON:  None. FINDINGS: Brain: No acute infarct or hemorrhage. Focal hypodensities within the periventricular white matter and bilateral basal ganglia most consistent with chronic small vessel ischemic change. Lateral ventricles and remaining midline structures are unremarkable. No acute extra-axial fluid collections. No mass effect. Vascular: No hyperdense vessel or unexpected calcification. Skull: Normal. Negative for fracture or focal lesion. Sinuses/Orbits: No acute finding.  Postsurgical changes left globe. Other: None. IMPRESSION: 1. No acute intracranial process. Electronically Signed   By: Randa Ngo M.D.   On: 02/22/2021 21:07   CT Cervical Spine Wo Contrast  Result Date: 02/22/2021 CLINICAL DATA:  Head and neck trauma, fell EXAM: CT CERVICAL SPINE WITHOUT CONTRAST TECHNIQUE: Multidetector CT imaging of the cervical spine was performed without intravenous contrast. Multiplanar CT image reconstructions were also generated. COMPARISON:  None. FINDINGS: Alignment: Alignment is anatomic. Skull base and vertebrae: No acute fracture. No primary bone lesion or focal pathologic process. Soft tissues and spinal canal: No prevertebral fluid or swelling. No visible canal hematoma. Disc levels: There is diffuse cervical spondylosis with prominent disc space narrowing and osteophyte formation from C3-4 through C6-7. Bilateral neural foraminal encroachment is seen at these levels. Hypertrophic changes at the C1-2 interface. Upper chest: Airway is patent. Likely diverticulum off the right posterolateral aspect of the trachea at the  level of the thoracic inlet. Lung apices are clear. Other: Reconstructed images demonstrate no additional findings. IMPRESSION: 1. No acute cervical spine fracture. 2. Significant multilevel cervical spondylosis from C3-4 through C6-7,  with associated neural foraminal encroachment. Electronically Signed   By: Randa Ngo M.D.   On: 02/22/2021 21:09   DG Knee Complete 4 Views Left  Result Date: 02/22/2021 CLINICAL DATA:  Fall with knee pain. EXAM: LEFT KNEE - COMPLETE 4+ VIEW COMPARISON:  None. FINDINGS: No evidence for an acute fracture. No subluxation or dislocation. No joint effusion. Calcification of the lateral meniscus noted. No worrisome lytic or sclerotic osseous abnormality. IMPRESSION: 1. No acute bony abnormality. 2. Chondrocalcinosis in the lateral meniscus. This can be associated with CPPD. Electronically Signed   By: Misty Stanley M.D.   On: 02/22/2021 18:40   DG Knee Complete 4 Views Right  Result Date: 02/22/2021 CLINICAL DATA:  Fall, knee pain EXAM: RIGHT KNEE - COMPLETE 4+ VIEW COMPARISON:  None. FINDINGS: Chondrocalcinosis. Joint spaces maintained. No acute bony abnormality. Specifically, no fracture, subluxation, or dislocation. No joint effusion. IMPRESSION: No acute bony abnormality. Electronically Signed   By: Rolm Baptise M.D.   On: 02/22/2021 18:41    Procedures .Critical Care Performed by: Volanda Napoleon, PA-C Authorized by: Volanda Napoleon, PA-C   Critical care provider statement:    Critical care time (minutes):  45   Critical care was necessary to treat or prevent imminent or life-threatening deterioration of the following conditions:  Metabolic crisis   Critical care was time spent personally by me on the following activities:  Discussions with consultants, evaluation of patient's response to treatment, examination of patient, ordering and performing treatments and interventions, ordering and review of laboratory studies, ordering and review of radiographic  studies, pulse oximetry, re-evaluation of patient's condition, obtaining history from patient or surrogate and review of old charts   I assumed direction of critical care for this patient from another provider in my specialty: yes     Care discussed with: admitting provider       Medications Ordered in ED Medications  sodium chloride (hypertonic) 3 % solution (has no administration in time range)  sodium chloride 0.9 % bolus 1,000 mL (1,000 mLs Intravenous New Bag/Given 02/22/21 2140)    ED Course  I have reviewed the triage vital signs and the nursing notes.  Pertinent labs & imaging results that were available during my care of the patient were reviewed by me and considered in my medical decision making (see chart for details).    MDM Rules/Calculators/A&P                          80 y.o. M resents for evaluation of fall.  Also reports he has felt generalized weakness.  He does states that he had had 1 episode of vomiting.  No fevers, chest pain, difficulty breathing.  On initial arrival, he is afebrile, nontoxic-appearing.  Vital signs are stable.  No neurodeficits noted on exam but does report hitting the back of his head and has tenderness to that area.  It lines up just at the top of the C-spine.  We will plan for imaging.  Labs, knee x-rays ordered at triage.  BMP shows sodium of 111, bicarb of 18. He was seen at [facility] on 02/19/21 and was noted to have a sodium of 132 at that time. Will repeat it.  Glucose within normal limits.  Anion gap is 12.  CBC shows leukocytosis of 18.2.  He does report he had maybe 1 episode of vomiting while in the waiting room.  He states at home, he has had not had nausea/vomiting.  He was seen in [place] for evaluation of abdominal pain at this time.  X-rays of bilateral knees negative for any acute bony abnormality.  Chest x-ray negative.  Repeat sodium is 112.  Unclear etiology of his hyponatremia.  He does not look significantly fluid depleted.  He  has not had any vomiting, diarrhea.  We will plan for admission.  Discussed patient with Dr. Joylene Grapes (Nephro).  He recommends starting him on hypertonic saline at 30 cc/h until sodium is 120.  Discussed patient with Dr. Jonelle Sidle (hospitalist) who accepts patient for admission.   Portions of this note were generated with [employer]. Dictation errors may occur despite best attempts at proofreading.  Final Clinical Impression(s) / ED Diagnoses Final diagnoses:  Hyponatremia  Fall, initial encounter    Rx / DC Orders ED Discharge Orders    None       Desma Mcgregor 02/22/21 2254    Isla Pence, MD 02/26/21 [phone]

## 2021-02-22 NOTE — H&P (Signed)
History and Physical   Jesse [name] VQQ:[id number] DOB: 1941-02-21 DOA: 02/22/2021  Referring MD/NP/PA: Louanna Raw, PA  PCP: Wenda Low, MD   Outpatient Specialists: None  Patient coming from: Home  Chief Complaint: Fall  HPI: Jesse [name] is a 80 y.o. male with medical history significant of hypertension, depression, hyperlipidemia, polycythemia rubra vera and history of herpes zoster who presented to the ER after feeling so weak lately and today he fell.  Patient fell on his knees bilaterally.  He has been having generalized weakness.  Denied any dizziness.  Denied any chest pain.  Denied any other muscular weakness.  Patient was complaining of bilateral knee pains.  He is not on any blood thinners and did not hit his head.  On evaluation in the ER he was found to have a sodium of 112.  No confusion noted.  Patient reported not taking adequate salt due to his blood pressure.  He is also on ACE inhibitor with HCTZ for blood pressure control.  He has had some recent nausea and diarrhea.  Did not have much of vomiting.  Patient being admitted for evaluation of severe hyponatremia.  Nephrology has been consulted and will follow up with patient with recommendations..  ED Course: Temperature is 98.7 blood pressure 94/72, pulse 91, respiratory of 31 oxygen sat 94% on room air.  White count 18.2 sodium 112 CO2 20 and chloride 79.  The rest of the CBC and chemistry appear to be within normal.  CT head without contrast and CT cervical spine all within normal.  Chest x-ray and x-ray of the knees bilaterally showed no acute findings.  Patient will be admitted for further evaluation and treatment.  Review of Systems: As per HPI otherwise 10 point review of systems negative.    Past Medical History:  Diagnosis Date  . Depression   . Hypercholesteremia   . Hypertension   . HZ (herpes zoster) 09/02/2011  . Neuritis    left foot  . Polycythemia rubra vera ([place]) 09/02/2011    Past  Surgical History:  Procedure Laterality Date  . GAS INSERTION  06/04/2012   Procedure: INSERTION OF GAS;  Surgeon: Hayden Pedro, MD;  Location: [place];  Service: Ophthalmology;  Laterality: Left;  C3F8  . SCLERAL BUCKLE  06/04/2012   Procedure: SCLERAL BUCKLE;  Surgeon: Hayden Pedro, MD;  Location: [place];  Service: Ophthalmology;  Laterality: Left;  Headscope laser     reports that he has never smoked. He has never used smokeless tobacco. He reports that he does not drink alcohol and does not use drugs.  Allergies  Allergen Reactions  . Tramadol Nausea And Vomiting    Family History  Problem Relation Age of Onset  . Heart attack Mother      Prior to Admission medications   Medication Sig Start Date End Date Taking? Authorizing Provider  ALPRAZolam [name]) 1 MG tablet Take 0.5 mg by mouth at bedtime.  07/31/18  Yes [provider]  aspirin EC 81 MG tablet Take 81 mg by mouth daily.   Yes [provider]  COVID-19 mRNA Vac-TriS, Pfizer, SUSP injection USE AS DIRECTED 11/29/20 11/29/21 Yes [name], MD  fish oil-omega-3 fatty acids 1000 MG capsule Take 1 g by mouth daily.   Yes [provider]  lisinopril-hydrochlorothiazide (PRINZIDE,ZESTORETIC) 20-12.5 MG per tablet Take 1 tablet by mouth daily.   Yes [provider]  ondansetron (ZOFRAN ODT) 4 MG disintegrating tablet Take 1 tablet (4 mg  total) by mouth every 8 (eight) hours as needed for up to 15 doses for nausea or vomiting. 02/28/20  Yes [name], MD  simvastatin (ZOCOR) 20 MG tablet Take 20 mg by mouth daily at 6 PM.  02/20/19  Yes [provider]  vitamin E 1000 UNIT capsule Take 1,000 Units by mouth daily. 11/18/19  Yes [provider]    Physical Exam: Vitals:   02/22/21 1759 02/22/21 2030 02/22/21 2038 02/22/21 2100  BP:   105/75 126/81  Pulse:  88  85  Resp:  (!) 25  (!) 31  Temp:      TempSrc:      SpO2:  96%  97%  Weight: 72.1 kg     Height: 5' 5.5"  (1.664 m)         Constitutional: Acutely ill looking and weak Vitals:   02/22/21 1759 02/22/21 2030 02/22/21 2038 02/22/21 2100  BP:   105/75 126/81  Pulse:  88  85  Resp:  (!) 25  (!) 31  Temp:      TempSrc:      SpO2:  96%  97%  Weight: 72.1 kg     Height: 5' 5.5" (1.664 m)      Eyes: PERRL, lids and conjunctivae normal ENMT: Mucous membranes are dry. Posterior pharynx clear of any exudate or lesions.Normal dentition.  Neck: normal, supple, no masses, no thyromegaly Respiratory: clear to auscultation bilaterally, no wheezing, no crackles. Normal respiratory effort. No accessory muscle use.  Cardiovascular: Regular rate and rhythm, no murmurs / rubs / gallops. No extremity edema. 2+ pedal pulses. No carotid bruits.  Abdomen: no tenderness, no masses palpated. No hepatosplenomegaly. Bowel sounds positive.  Musculoskeletal: no clubbing / cyanosis. No joint deformity upper and lower extremities. Good ROM, no contractures. Normal muscle tone.  Skin: Dry coarse skin, no rashes, lesions, ulcers. No induration Neurologic: CN 2-12 grossly intact. Sensation intact, DTR normal. Strength 5/5 in all 4.  Psychiatric: Normal judgment and insight. Alert and oriented x 3. Normal mood.     Labs on Admission: I have personally reviewed following labs and imaging studies  CBC: Recent Labs  Lab 02/19/21 1621 02/22/21 1744  WBC 12.6* 18.2*  NEUTROABS  --  14.5*  HGB 15.2 14.2  HCT 47.2 42.7  MCV 86.1 83.9  PLT 307 144   Basic Metabolic Panel: Recent Labs  Lab 02/19/21 1621 02/22/21 1744 02/22/21 1949  NA 132* 111* 112*  K 3.7 4.0 3.6  CL 97* 81* 79*  CO2 26 18* 20*  GLUCOSE 139* 96 96  BUN 17 24* 23  CREATININE 1.15 1.08 1.04  CALCIUM 9.8 8.7* 8.9   GFR: Estimated Creatinine Clearance: 51.9 mL/min (by C-G formula based on SCr of 1.04 mg/dL). Liver Function Tests: Recent Labs  Lab 02/19/21 1621  AST 20  ALT 20  ALKPHOS 54  BILITOT 0.9  PROT 7.1  ALBUMIN 4.0    Recent Labs  Lab 02/19/21 1621  LIPASE 28   No results for input(s): AMMONIA in the last 168 hours. Coagulation Profile: No results for input(s): INR, PROTIME in the last 168 hours. Cardiac Enzymes: No results for input(s): CKTOTAL, CKMB, CKMBINDEX, TROPONINI in the last 168 hours. BNP (last 3 results) No results for input(s): PROBNP in the last 8760 hours. HbA1C: No results for input(s): HGBA1C in the last 72 hours. CBG: No results for input(s): GLUCAP in the last 168 hours. Lipid Profile: No results for input(s): CHOL, HDL, LDLCALC, TRIG, CHOLHDL, LDLDIRECT  in the last 72 hours. Thyroid Function Tests: No results for input(s): TSH, T4TOTAL, FREET4, T3FREE, THYROIDAB in the last 72 hours. Anemia Panel: No results for input(s): VITAMINB12, FOLATE, FERRITIN, TIBC, IRON, RETICCTPCT in the last 72 hours. Urine analysis:    Component Value Date/Time   COLORURINE YELLOW 02/19/2021 1600   APPEARANCEUR CLEAR 02/19/2021 1600   LABSPEC 1.010 02/19/2021 1600   PHURINE 7.0 02/19/2021 1600   GLUCOSEU NEGATIVE 02/19/2021 1600   HGBUR NEGATIVE 02/19/2021 1600   BILIRUBINUR NEGATIVE 02/19/2021 1600   KETONESUR 5 (A) 02/19/2021 1600   PROTEINUR NEGATIVE 02/19/2021 1600   UROBILINOGEN 0.2 10/15/2014 1825   NITRITE NEGATIVE 02/19/2021 1600   LEUKOCYTESUR NEGATIVE 02/19/2021 1600   Sepsis Labs: @LABRCNTIP (procalcitonin:4,lacticidven:4) )No results found for this or any previous visit (from the past 240 hour(s)).   Radiological Exams on Admission: DG Chest 2 View  Result Date: 02/22/2021 CLINICAL DATA:  Fall, altered mental status EXAM: CHEST - 2 VIEW COMPARISON:  10/15/2014 FINDINGS: Heart and mediastinal contours are within normal limits. No focal opacities or effusions. No acute bony abnormality. Aortic atherosclerosis. IMPRESSION: No active cardiopulmonary disease. Electronically Signed   By: Rolm Baptise M.D.   On: 02/22/2021 18:41   CT Head Wo Contrast  Result Date:  02/22/2021 CLINICAL DATA:  Head and neck trauma, fell EXAM: CT HEAD WITHOUT CONTRAST TECHNIQUE: Contiguous axial images were obtained from the base of the skull through the vertex without intravenous contrast. COMPARISON:  None. FINDINGS: Brain: No acute infarct or hemorrhage. Focal hypodensities within the periventricular white matter and bilateral basal ganglia most consistent with chronic small vessel ischemic change. Lateral ventricles and remaining midline structures are unremarkable. No acute extra-axial fluid collections. No mass effect. Vascular: No hyperdense vessel or unexpected calcification. Skull: Normal. Negative for fracture or focal lesion. Sinuses/Orbits: No acute finding.  Postsurgical changes left globe. Other: None. IMPRESSION: 1. No acute intracranial process. Electronically Signed   By: Randa Ngo M.D.   On: 02/22/2021 21:07   CT Cervical Spine Wo Contrast  Result Date: 02/22/2021 CLINICAL DATA:  Head and neck trauma, fell EXAM: CT CERVICAL SPINE WITHOUT CONTRAST TECHNIQUE: Multidetector CT imaging of the cervical spine was performed without intravenous contrast. Multiplanar CT image reconstructions were also generated. COMPARISON:  None. FINDINGS: Alignment: Alignment is anatomic. Skull base and vertebrae: No acute fracture. No primary bone lesion or focal pathologic process. Soft tissues and spinal canal: No prevertebral fluid or swelling. No visible canal hematoma. Disc levels: There is diffuse cervical spondylosis with prominent disc space narrowing and osteophyte formation from C3-4 through C6-7. Bilateral neural foraminal encroachment is seen at these levels. Hypertrophic changes at the C1-2 interface. Upper chest: Airway is patent. Likely diverticulum off the right posterolateral aspect of the trachea at the level of the thoracic inlet. Lung apices are clear. Other: Reconstructed images demonstrate no additional findings. IMPRESSION: 1. No acute cervical spine fracture. 2.  Significant multilevel cervical spondylosis from C3-4 through C6-7, with associated neural foraminal encroachment. Electronically Signed   By: Randa Ngo M.D.   On: 02/22/2021 21:09   DG Knee Complete 4 Views Left  Result Date: 02/22/2021 CLINICAL DATA:  Fall with knee pain. EXAM: LEFT KNEE - COMPLETE 4+ VIEW COMPARISON:  None. FINDINGS: No evidence for an acute fracture. No subluxation or dislocation. No joint effusion. Calcification of the lateral meniscus noted. No worrisome lytic or sclerotic osseous abnormality. IMPRESSION: 1. No acute bony abnormality. 2. Chondrocalcinosis in the lateral meniscus. This can be associated with CPPD. Electronically  Signed   By: Misty Stanley M.D.   On: 02/22/2021 18:40   DG Knee Complete 4 Views Right  Result Date: 02/22/2021 CLINICAL DATA:  Fall, knee pain EXAM: RIGHT KNEE - COMPLETE 4+ VIEW COMPARISON:  None. FINDINGS: Chondrocalcinosis. Joint spaces maintained. No acute bony abnormality. Specifically, no fracture, subluxation, or dislocation. No joint effusion. IMPRESSION: No acute bony abnormality. Electronically Signed   By: Rolm Baptise M.D.   On: 02/22/2021 18:41    EKG: Independently reviewed.  Shows sinus rhythm with a rate of 82.  Low voltage.  No significant ST changes.  Assessment/Plan Principal Problem:   Hyponatremia Active Problems:   Polycythemia rubra vera ([place])   Essential hypertension   Hyperlipidemia   Fall at home, initial encounter     #1 severe hyponatremia: Probably combination of poor salt intake, diuretics, recent nausea vomiting and diarrhea, as well as generalized dehydration.  Patient will has received 1 L bolus of normal saline to expand his volume.  3% saline recommended by nephrology.  We will continue with slow infusion.  Once patient's sodium improves to reasonable limits will discharge him home.  We will probably hold off on the diuretics not just this hospitalization but moving forward.  #2 essential hypertension:  Holding patient's home regimen.  He has been hypotensive.  White blood pressure rebounds we will restart him on other medications except diuretics.  #3 hyperlipidemia: Continue with Zocor.  #4 fall at home: Most likely due to the hyponatremia and dehydration.  PT OT consult prior to discharge.  #5 history of PCV: Stable.  Continue outpatient follow-up   DVT prophylaxis: Lovenox Code Status: Full Family Communication: No family at bedside Disposition Plan: Home Consults called: None Admission status: Inpatient  Severity of Illness: The appropriate patient status for this patient is INPATIENT. Inpatient status is judged to be reasonable and necessary in order to provide the required intensity of service to ensure the patient's safety. The patient's presenting symptoms, physical exam findings, and initial radiographic and laboratory data in the context of their chronic comorbidities is felt to place them at high risk for further clinical deterioration. Furthermore, it is not anticipated that the patient will be medically stable for discharge from the hospital within 2 midnights of admission. The following factors support the patient status of inpatient.   " The patient's presenting symptoms include fall with weakness. " The worrisome physical exam findings include dry mucous membranes. " The initial radiographic and laboratory data are worrisome because of sodium 112. " The chronic co-morbidities include essential hypertension.   * I certify that at the point of admission it is my clinical judgment that the patient will require inpatient hospital care spanning beyond 2 midnights from the point of admission due to high intensity of service, high risk for further deterioration and high frequency of surveillance required.Barbette Merino MD [facility] Pager [phone]  If 7PM-7AM, please contact night-coverage [email or web address] Password [facility]  02/22/2021, 10:21 PM

## 2021-02-23 DIAGNOSIS — I10 Essential (primary) hypertension: Secondary | ICD-10-CM

## 2021-02-23 DIAGNOSIS — W19XXXA Unspecified fall, initial encounter: Secondary | ICD-10-CM

## 2021-02-23 DIAGNOSIS — E871 Hypo-osmolality and hyponatremia: Principal | ICD-10-CM

## 2021-02-23 DIAGNOSIS — Y92009 Unspecified place in unspecified non-institutional (private) residence as the place of occurrence of the external cause: Secondary | ICD-10-CM

## 2021-02-23 LAB — URINALYSIS, ROUTINE W REFLEX MICROSCOPIC
Bilirubin Urine: NEGATIVE
Glucose, UA: NEGATIVE mg/dL
Ketones, ur: 20 mg/dL — AB
Leukocytes,Ua: NEGATIVE
Nitrite: NEGATIVE
Protein, ur: NEGATIVE mg/dL
Specific Gravity, Urine: 1.015 (ref 1.005–1.030)
pH: 6 (ref 5.0–8.0)

## 2021-02-23 LAB — BASIC METABOLIC PANEL
Anion gap: 10 (ref 5–15)
BUN: 19 mg/dL (ref 8–23)
CO2: 22 mmol/L (ref 22–32)
Calcium: 8.6 mg/dL — ABNORMAL LOW (ref 8.9–10.3)
Chloride: 87 mmol/L — ABNORMAL LOW (ref 98–111)
Creatinine, Ser: 0.79 mg/dL (ref 0.61–1.24)
GFR, Estimated: >60 mL/min (ref >60)
Glucose, Bld: 100 mg/dL — ABNORMAL HIGH (ref 70–99)
Potassium: 3.2 mmol/L — ABNORMAL LOW (ref 3.5–5.1)
Sodium: 119 mmol/L — CL (ref 135–145)

## 2021-02-23 LAB — COMPREHENSIVE METABOLIC PANEL
ALT: 70 U/L — ABNORMAL HIGH (ref 0–44)
AST: 152 U/L — ABNORMAL HIGH (ref 15–41)
Albumin: 2.8 g/dL — ABNORMAL LOW (ref 3.5–5.0)
Alkaline Phosphatase: 37 U/L — ABNORMAL LOW (ref 38–126)
Anion gap: 11 (ref 5–15)
BUN: 19 mg/dL (ref 8–23)
CO2: 23 mmol/L (ref 22–32)
Calcium: 8.3 mg/dL — ABNORMAL LOW (ref 8.9–10.3)
Chloride: 85 mmol/L — ABNORMAL LOW (ref 98–111)
Creatinine, Ser: 0.9 mg/dL (ref 0.61–1.24)
GFR, Estimated: >60 mL/min (ref >60)
Glucose, Bld: 84 mg/dL (ref 70–99)
Potassium: 3 mmol/L — ABNORMAL LOW (ref 3.5–5.1)
Sodium: 119 mmol/L — CL (ref 135–145)
Total Bilirubin: 2.9 mg/dL — ABNORMAL HIGH (ref 0.3–1.2)
Total Protein: 5.4 g/dL — ABNORMAL LOW (ref 6.5–8.1)

## 2021-02-23 LAB — CBC
HCT: 40.2 % (ref 39.0–52.0)
Hemoglobin: 14 g/dL (ref 13.0–17.0)
MCH: 27.9 pg (ref 26.0–34.0)
MCHC: 34.8 g/dL (ref 30.0–36.0)
MCV: 80.2 fL (ref 80.0–100.0)
Platelets: 285 10*3/uL (ref 150–400)
RBC: 5.01 MIL/uL (ref 4.22–5.81)
RDW: 15.2 % (ref 11.5–15.5)
WBC: 16.3 10*3/uL — ABNORMAL HIGH (ref 4.0–10.5)
nRBC: 0 % (ref 0.0–0.2)

## 2021-02-23 LAB — TSH: TSH: 0.738 u[IU]/mL (ref 0.350–4.500)

## 2021-02-23 LAB — CORTISOL: Cortisol, Plasma: 19.5 ug/dL

## 2021-02-23 LAB — MRSA PCR SCREENING: MRSA by PCR: NEGATIVE

## 2021-02-23 LAB — SODIUM
Sodium: 116 mmol/L — CL (ref 135–145)
Sodium: 120 mmol/L — ABNORMAL LOW (ref 135–145)
Sodium: 120 mmol/L — ABNORMAL LOW (ref 135–145)
Sodium: 121 mmol/L — ABNORMAL LOW (ref 135–145)
Sodium: 120 mmol/L — ABNORMAL LOW (ref 135–145)
Sodium: 120 mmol/L — ABNORMAL LOW (ref 135–145)
Sodium: 116 mmol/L — CL (ref 135–145)
Sodium: 116 mmol/L — CL (ref 135–145)
Sodium: 118 mmol/L — CL (ref 135–145)

## 2021-02-23 LAB — MAGNESIUM: Magnesium: 1.8 mg/dL (ref 1.7–2.4)

## 2021-02-23 LAB — OSMOLALITY: Osmolality: 242 mOsm/kg — CL (ref 275–295)

## 2021-02-23 LAB — SODIUM, URINE, RANDOM: Sodium, Ur: 20 mmol/L

## 2021-02-23 LAB — OSMOLALITY, URINE: Osmolality, Ur: 510 mOsm/kg (ref 300–900)

## 2021-02-23 MED ORDER — DEXTROSE 5 % IV SOLN: INTRAVENOUS | Status: DC

## 2021-02-23 MED ORDER — ASPIRIN EC 81 MG PO TBEC
81.0000 mg | DELAYED_RELEASE_TABLET | Freq: Every day | ORAL | Status: DC
  Administered 2021-02-23 – 2021-02-27 (×5): 81 mg via ORAL
  Filled 2021-02-23 (×5): qty 1

## 2021-02-23 MED ORDER — ONDANSETRON 4 MG PO TBDP: 4.0000 mg | ORAL_TABLET | Freq: Three times a day (TID) | ORAL | Status: DC | PRN

## 2021-02-23 MED ORDER — SODIUM CHLORIDE 0.9 % IV SOLN: INTRAVENOUS | Status: DC

## 2021-02-23 MED ORDER — SIMVASTATIN 20 MG PO TABS
20.0000 mg | ORAL_TABLET | Freq: Every day | ORAL | Status: DC
  Administered 2021-02-23 – 2021-02-26 (×4): 20 mg via ORAL
  Filled 2021-02-23 (×4): qty 1

## 2021-02-23 MED ORDER — POTASSIUM CHLORIDE CRYS ER 20 MEQ PO TBCR
30.0000 meq | EXTENDED_RELEASE_TABLET | Freq: Two times a day (BID) | ORAL | Status: AC
  Administered 2021-02-23 (×2): 30 meq via ORAL
  Filled 2021-02-23 (×2): qty 1

## 2021-02-23 MED ORDER — OMEGA-3-ACID ETHYL ESTERS 1 G PO CAPS
1.0000 g | ORAL_CAPSULE | Freq: Every day | ORAL | Status: DC
  Administered 2021-02-23 – 2021-02-27 (×5): 1 g via ORAL
  Filled 2021-02-23 (×5): qty 1

## 2021-02-23 MED ORDER — ONDANSETRON HCL 4 MG PO TABS: 4.0000 mg | ORAL_TABLET | Freq: Four times a day (QID) | ORAL | Status: DC | PRN

## 2021-02-23 MED ORDER — POTASSIUM CHLORIDE 10 MEQ/100ML IV SOLN
10.0000 meq | INTRAVENOUS | Status: AC
  Administered 2021-02-23 (×4): 10 meq via INTRAVENOUS
  Filled 2021-02-23 (×4): qty 100

## 2021-02-23 MED ORDER — ENOXAPARIN SODIUM 40 MG/0.4ML IJ SOSY
40.0000 mg | PREFILLED_SYRINGE | INTRAMUSCULAR | Status: DC
  Administered 2021-02-23 – 2021-02-27 (×4): 40 mg via SUBCUTANEOUS
  Filled 2021-02-23 (×4): qty 0.4

## 2021-02-23 MED ORDER — ALPRAZOLAM 0.5 MG PO TABS: 0.5000 mg | ORAL_TABLET | Freq: Every evening | ORAL | Status: DC | PRN

## 2021-02-23 MED ORDER — VITAMIN E 45 MG (100 UNIT) PO CAPS
1000.0000 [IU] | ORAL_CAPSULE | Freq: Every day | ORAL | Status: DC
  Administered 2021-02-23 – 2021-02-27 (×5): 1000 [IU] via ORAL
  Filled 2021-02-23 (×5): qty 10

## 2021-02-23 MED ORDER — CHLORHEXIDINE GLUCONATE CLOTH 2 % EX PADS
6.0000 | MEDICATED_PAD | Freq: Every day | CUTANEOUS | Status: DC
  Administered 2021-02-23 – 2021-02-25 (×3): 6 via TOPICAL

## 2021-02-23 MED ORDER — ONDANSETRON HCL 4 MG/2ML IJ SOLN
4.0000 mg | Freq: Four times a day (QID) | INTRAMUSCULAR | Status: DC | PRN
  Administered 2021-02-25: 4 mg via INTRAVENOUS
  Filled 2021-02-23: qty 2

## 2021-02-23 MED ORDER — LOPERAMIDE HCL 2 MG PO CAPS
4.0000 mg | ORAL_CAPSULE | ORAL | Status: DC | PRN
  Filled 2021-02-23: qty 2

## 2021-02-23 NOTE — Consult Note (Signed)
NAME:  Jesse [name], MRN:  [id number], DOB:  April 29, 1941, LOS: 1 ADMISSION DATE:  02/22/2021, CONSULTATION DATE:  02/23/21 REFERRING MD:  Jonelle Sidle CHIEF COMPLAINT:  Weakness, fall   History of Present Illness:  Jesse [name] is a 80 y.o. male who presented to [facility] ED 02/23/21 after a fall at home.  He had apparently had progressive weakness to the point he fell and landed on bilateral knees.  He did not hit his head and did lose consciousness. In ED, he was found to have Na 112.  He did not have any additional neurological symptoms.  He had informed ED team that he restricts his salt intake due to hx of HTN. Of note, he is also on HCTZ at baseline.    CT head was negative. Nephrology was consulted and recommended 3% NS.  Due to 3%, PCCM asked to see in consultation for ICU admission.  Pertinent  Medical History:  has Polycythemia rubra vera ([place]); HZ (herpes zoster); Need for prophylactic vaccination and inoculation against influenza; Hyponatremia; Essential hypertension; Hyperlipidemia; and Fall at home, initial encounter on their problem list.  [facility] Events: Including procedures, antibiotic start and stop dates in addition to other pertinent events   6/3 > admit.  Micro Data: Flu 6/3 > neg. SARS CoV2 6/3 > neg.  Antibiotics: None.  Interim History / Subjective:  Vitals stable.  Repeat Na pending.  Objective:  Blood pressure 111/63, pulse 80, temperature 98.7 F (37.1 C), temperature source Oral, resp. rate (!) 25, height 5' 5.5" (1.664 m), weight 72.1 kg, SpO2 94 %.       No intake or output data in the 24 hours ending 02/23/21 0028 Filed Weights   02/22/21 1759  Weight: 72.1 kg    Examination: General: Adult male, resting in bed, in NAD. Neuro: A&O x 3, no deficits. HEENT: [place]/AT. Sclerae anicteric. EOMI.  MM dry. Cardiovascular: RRR, no M/R/G.  Lungs: Respirations even and unlabored.  CTA bilaterally, No W/R/R. Abdomen: BS x 4, soft, NT/ND.  Musculoskeletal: No  gross deformities, no edema.  Skin: Intact, warm, no rashes.  Labs/imaging personally reviewed:  CT head 6/3 > neg. R and L knee Xray 6/3 > neg.   Resolved Hospital Problem list:   Assessment & Plan:   Hyponatremia - multifactorial due to decreased PO intake and HCTZ.  He is s/p 1L NS and has been started on 3% NS per nephrology recs. - Ok to continue 3% as already started, but likely can d/c soon and continue with just NS rehydration Warren Lacy MD asked to follow q2hrs Na checks). - Follow Na closely to avoid rapid overcorrection. - Hold home HCTZ.  Fall - presumed 2/2 above. - Agree PT eval in AM to ensure no gait disturbance or safety concern with ambulation.  Hx HTN, HLD. - Hold home Prinzide, Zestoretic. - Continue home Simvastatin.  Hx PCV. - F/u as outpatient.  Hx Depression. - Continue home QHS Alprazolam.  Best practice (evaluated daily):  Diet:  Regular Pain/Anxiety/Delirium protocol (if indicated): No VAP protocol (if indicated): Not indicated DVT prophylaxis: LMWH GI prophylaxis: N/A Glucose control:  SSI No Central venous access:  N/A Arterial line:  N/A Foley:  N/A Mobility:  bed rest  PT consulted: Yes Last date of multidisciplinary goals of care discussion: None Code Status:  full code Disposition: ICU while on hypertonic saline  Labs   CBC: Recent Labs  Lab 02/19/21 1621 02/22/21 1744  WBC 12.6* 18.2*  NEUTROABS  --  14.5*  HGB 15.2 14.2  HCT 47.2 42.7  MCV 86.1 83.9  PLT 307 700    Basic Metabolic Panel: Recent Labs  Lab 02/19/21 1621 02/22/21 1744 02/22/21 1949  NA 132* 111* 112*  K 3.7 4.0 3.6  CL 97* 81* 79*  CO2 26 18* 20*  GLUCOSE 139* 96 96  BUN 17 24* 23  CREATININE 1.15 1.08 1.04  CALCIUM 9.8 8.7* 8.9   GFR: Estimated Creatinine Clearance: 51.9 mL/min (by C-G formula based on SCr of 1.04 mg/dL). Recent Labs  Lab 02/19/21 1621 02/22/21 1744  WBC 12.6* 18.2*    Liver Function Tests: Recent Labs  Lab  02/19/21 1621  AST 20  ALT 20  ALKPHOS 54  BILITOT 0.9  PROT 7.1  ALBUMIN 4.0   Recent Labs  Lab 02/19/21 1621  LIPASE 28   No results for input(s): AMMONIA in the last 168 hours.  ABG No results found for: PHART, PCO2ART, PO2ART, HCO3, TCO2, ACIDBASEDEF, O2SAT   Coagulation Profile: No results for input(s): INR, PROTIME in the last 168 hours.  Cardiac Enzymes: No results for input(s): CKTOTAL, CKMB, CKMBINDEX, TROPONINI in the last 168 hours.  HbA1C: No results found for: HGBA1C  CBG: No results for input(s): GLUCAP in the last 168 hours.  Review of Systems:   All negative; except for those that are bolded, which indicate positives.  Constitutional: weight loss, weight gain, night sweats, fevers, chills, fatigue, weakness.  HEENT: headaches, sore throat, sneezing, nasal congestion, post nasal drip, difficulty swallowing, tooth/dental problems, visual complaints, visual changes, ear aches. Neuro: difficulty with speech, weakness, numbness, ataxia, falls. CV:  chest pain, orthopnea, PND, swelling in lower extremities, dizziness, palpitations, syncope.  Resp: cough, hemoptysis, dyspnea, wheezing. GI: heartburn, indigestion, abdominal pain, nausea, vomiting, diarrhea, constipation, change in bowel habits, loss of appetite, hematemesis, melena, hematochezia.  GU: dysuria, change in color of urine, urgency or frequency, flank pain, hematuria. MSK: joint pain or swelling, decreased range of motion. Psych: change in mood or affect, depression, anxiety, suicidal ideations, homicidal ideations. Skin: rash, itching, bruising.   Past Medical History:  He,  has a past medical history of Depression, Hypercholesteremia, Hypertension, HZ (herpes zoster) (09/02/2011), Neuritis, and Polycythemia rubra vera ([place]) (09/02/2011).   Surgical History:   Past Surgical History:  Procedure Laterality Date  . GAS INSERTION  06/04/2012   Procedure: INSERTION OF GAS;  Surgeon: Hayden Pedro, MD;  Location: [place];  Service: Ophthalmology;  Laterality: Left;  C3F8  . SCLERAL BUCKLE  06/04/2012   Procedure: SCLERAL BUCKLE;  Surgeon: Hayden Pedro, MD;  Location: [place];  Service: Ophthalmology;  Laterality: Left;  Headscope laser     Social History:   reports that he has never smoked. He has never used smokeless tobacco. He reports that he does not drink alcohol and does not use drugs.   Family History:  His family history includes Heart attack in his mother.   Allergies Allergies  Allergen Reactions  . Tramadol Nausea And Vomiting     Home Medications  Prior to Admission medications   Medication Sig Start Date End Date Taking? Authorizing Provider  ALPRAZolam [name]) 1 MG tablet Take 0.5 mg by mouth at bedtime.  07/31/18  Yes [provider]  aspirin EC 81 MG tablet Take 81 mg by mouth daily.   Yes [provider]  COVID-19 mRNA Vac-TriS, Pfizer, SUSP injection USE AS DIRECTED 11/29/20 11/29/21 Yes [name], MD  fish oil-omega-3 fatty acids 1000 MG capsule  Take 1 g by mouth daily.   Yes [provider]  lisinopril-hydrochlorothiazide (PRINZIDE,ZESTORETIC) 20-12.5 MG per tablet Take 1 tablet by mouth daily.   Yes [provider]  ondansetron (ZOFRAN ODT) 4 MG disintegrating tablet Take 1 tablet (4 mg total) by mouth every 8 (eight) hours as needed for up to 15 doses for nausea or vomiting. 02/28/20  Yes [name], MD  simvastatin (ZOCOR) 20 MG tablet Take 20 mg by mouth daily at 6 PM.  02/20/19  Yes [provider]  vitamin E 1000 UNIT capsule Take 1,000 Units by mouth daily. 11/18/19  Yes [provider]     Critical care time: 30 min.   Montey Hora, [facility] For pager details, please see AMION or use Epic chat  After 1900, please call [place] for cross coverage needs 02/23/2021, 12:28 AM

## 2021-02-23 NOTE — Progress Notes (Signed)
Patient arrived to 4E22 from 38M. VSS. Patient denies pain. Patient placed on telemetry and CCMD notified. Patient oriented to room and bed low and locked with call bell left within reach. Stool sample still needing to be collected, will notify night shift RN.   Gailen Shelter RN

## 2021-02-23 NOTE — Progress Notes (Signed)
02/23/2021 8:08 PM Critical NA level of 116 called by Lab.  Notified ELink of level.  No new orders at this time. [place]

## 2021-02-23 NOTE — Evaluation (Signed)
Physical Therapy Evaluation Patient Details Name: Jesse [name] MRN: [id number] DOB: [birth year] Today's Date: 02/23/2021   History of Present Illness  Jesse [name] is a 80 y.o. male who presented to [facility] ED 02/23/21 after a fall at home.  He had apparently had progressive weakness to the point he fell and landed on bilateral knees.  He did not hit his head and did lose consciousness. Pt with positive hyponatremia and hypokalemia.  PMH: HTN  Clinical Impression  Pt admitted with above diagnosis. Pt was able to ambulate with min guard assist to the door but became more assist when pt began having BM and urinating  in hallway. PT and nurse were safely able to get pt to sitting position and cleaned pt and floor.  Pt may need short term SNF as he stays with his sister who is 56 years old and she does have a caregiver 4 days a week but he provides her meals per pt.  Pt with poor safety overall today. Unsteady gait with rW. Pt currently with functional limitations due to the deficits listed below (see PT Problem List). Pt will benefit from skilled PT to increase their independence and safety with mobility to allow discharge to the venue listed below.     Follow Up Recommendations SNF    Equipment Recommendations  None recommended by PT    Recommendations for Other Services       Precautions / Restrictions Precautions Precautions: Fall Restrictions Weight Bearing Restrictions: No      Mobility  Bed Mobility Overal bed mobility: Needs Assistance Bed Mobility: Supine to Sit     Supine to sit: Mod assist     General bed mobility comments: Needed assist to come to eOB for trunk elevation and for LEs off of bed.    Transfers Overall transfer level: Needs assistance Equipment used: Rolling walker (2 wheeled) Transfers: Sit to/from Stand Sit to Stand: Min assist         General transfer comment: Asssit for power up and steadying once up  Ambulation/Gait Ambulation/Gait assistance: Min  assist;Mod assist;+2 safety/equipment Gait Distance (Feet): 25 Feet Assistive device: Rolling walker (2 wheeled) Gait Pattern/deviations: Step-to pattern;Decreased stride length;Decreased step length - right;Decreased step length - left;Shuffle;Antalgic;Leaning posteriorly;Staggering left;Staggering right;Narrow base of support   Gait velocity interpretation: <1.31 ft/sec, indicative of household ambulator General Gait Details: Pt ambulated to door of room and got just out of the door and stopped and began to turn around and was unsteady.  Before PT could assist pt with turn, pt began haveing BM and urinating in floor and pt could not control it.  Pt became unsteady but PT was able to steady him and keep him standing while nurse got a chair to bring behind pt.  Assisted nurse with getting pt back to bed and PT and nurse cleaned pt and floor.  Pt with poor safety awareness with RW and needed cues to stay close to it.  Pt also with poor judgment as he was concerned about his socks getting BM on them when PT and nurse were trying to just get him safely to chair.  Stairs            Wheelchair Mobility    Modified Rankin (Stroke Patients Only)       Balance Overall balance assessment: Needs assistance Sitting-balance support: No upper extremity supported;Feet supported Sitting balance-Leahy Scale: Fair     Standing balance support: Bilateral upper extremity supported;During functional activity Standing balance-Leahy Scale: Poor Standing  balance comment: relies heavily on UE support as well as external support                             Pertinent Vitals/Pain Pain Assessment: No/denies pain    Home Living Family/patient expects to be discharged to:: Private residence Living Arrangements: Other relatives (sister- 46 year old) Available Help at Discharge: Family;Available 24 hours/day;Personal care attendant (4 days week, 8 hours a day for sister) Type of Home: House Home  Access: Stairs to enter Entrance Stairs-Rails: Right Entrance Stairs-Number of Steps: 5 Home Layout: One level;Laundry or work area in [place]: Bedside commode;Cane - single point;Walker - 2 wheels;Tub bench      Prior Function Level of Independence: Needs assistance   Gait / Transfers Assistance Needed: walked on his own  ADL's / Homemaking Assistance Needed: Independent with B/D  Comments: Pt fixes sister meals     Hand Dominance   Dominant Hand: Right    Extremity/Trunk Assessment   Upper Extremity Assessment Upper Extremity Assessment: Defer to OT evaluation    Lower Extremity Assessment Lower Extremity Assessment: Generalized weakness    Cervical / Trunk Assessment Cervical / Trunk Assessment: Kyphotic  Communication   Communication: No difficulties  Cognition Arousal/Alertness: Awake/alert Behavior During Therapy: WFL for tasks assessed/performed Overall Cognitive Status: Impaired/Different from baseline Area of Impairment: Following commands;Awareness;Problem solving                       Following Commands: Follows one step commands with increased time     Problem Solving: Slow processing;Difficulty sequencing;Requires verbal cues;Requires tactile cues General Comments: Pt lacks safety awareness. needs incr cues for safety.      General Comments General comments (skin integrity, edema, etc.): 78 bpm, 96% RA, 21    Exercises General Exercises - Lower Extremity Ankle Circles/Pumps: AROM;Both;5 reps;Supine   Assessment/Plan    PT Assessment Patient needs continued PT services  PT Problem List Decreased activity tolerance;Decreased balance;Decreased mobility;Decreased knowledge of use of DME;Decreased safety awareness;Decreased knowledge of precautions       PT Treatment Interventions DME instruction;Gait training;Functional mobility training;Stair training;Therapeutic activities;Therapeutic exercise;Balance  training;Patient/family education    PT Goals (Current goals can be found in the Care Plan section)  Acute Rehab PT Goals Patient Stated Goal: to get better PT Goal Formulation: With patient Time For Goal Achievement: 03/09/21 Potential to Achieve Goals: Good    Frequency Min 3X/week   Barriers to discharge Decreased caregiver support      Co-evaluation               AM-PAC PT "6 Clicks" Mobility  Outcome Measure Help needed turning from your back to your side while in a flat bed without using bedrails?: A Little Help needed moving from lying on your back to sitting on the side of a flat bed without using bedrails?: A Little Help needed moving to and from a bed to a chair (including a wheelchair)?: A Little Help needed standing up from a chair using your arms (e.g., wheelchair or bedside chair)?: A Little Help needed to walk in hospital room?: A Lot Help needed climbing 3-5 steps with a railing? : A Lot 6 Click Score: 16    End of Session Equipment Utilized During Treatment: Gait belt Activity Tolerance: Patient limited by fatigue (limited by incontinence) Patient left: in bed;with call bell/phone within reach;with bed alarm set Nurse Communication: Mobility status PT Visit Diagnosis:  Unsteadiness on feet (R26.81);Muscle weakness (generalized) (M62.81)    Time: 9090-3014 PT Time Calculation (min) (ACUTE ONLY): 33 min   Charges:   PT Evaluation $PT Eval Moderate Complexity: 1 Mod PT Treatments $Gait Training: 8-22 mins        Shriyans Kuenzi M,PT Acute Rehab Services [phone] (pager)  Alvira Philips 02/23/2021, 12:53 PM

## 2021-02-23 NOTE — Progress Notes (Signed)
[facility] Progress Note Patient Name: Jesse [name] DOB: Sep 27, 1940 MRN: [id number]   Date of Service  02/23/2021  HPI/Events of Note  Patient came to the hospital for evaluation of failure to thrive, and a fall onto his knees and was found to be profoundly hyponatremic, work up is in progress.  eICU Interventions  New Patient Evaluation        Frederik Pear 02/23/2021, 2:59 AM

## 2021-02-23 NOTE — Progress Notes (Signed)
Patient ID: Jesse [name], male   DOB: 27-Sep-1940, 80 y.o.   MRN: [id number] [facility] Progress Note   Assessment/ Plan:   1. Acute hyponatremia (likely acute on chronic based on previous labs): Based on work-up suspected to be from preceding thiazide use and volume contraction/hypotonic fluid intake.  Urine osmolality skewed by preceding administration of hypertonic saline and unable to rule out SIADH.  Rise of sodium level and hypertonic saline noted with rate prompting discontinuation of this therapy and transition to D5W.  Current sodium level acceptable and will reevaluate subsequent labs today to decide on additional management.   2.  Hypokalemia: Likely secondary to poor intake and preceding losses from diuretics.  We will continue potassium supplementation and check magnesium levels. 3.  Hypertension: Blood pressure currently at goal, would recommend avoiding thiazide indefinitely. 4.  Elevated LFTs: He appears to have elevated transaminases with evidence of synthetic hepatic dysfunction with low albumin/elevated bilirubin.  He denies alcohol use.  Subjective:   Reports to be feeling better and inquires about his labs as well as expected length of stay.   Objective:   BP 112/79   Pulse 77   Temp (!) 97.4 F (36.3 C) (Oral)   Resp (!) 27   Ht 5' 5.5" (1.664 m)   Wt 70.8 kg   SpO2 95%   BMI 25.58 kg/m   Intake/Output Summary (Last 24 hours) at 02/23/2021 0740 Last data filed at 02/23/2021 0700 Gross per 24 hour  Intake 438.34 ml  Output 510 ml  Net -71.66 ml   Weight change:   Physical Exam: Gen: Comfortably sitting up in bed CVS: Pulse regular rhythm, normal rate, S1 and S2 normal Resp: Clear to auscultation bilaterally, no rales/rhonchi Abd: Soft, flat, nontender, bowel sounds normal Ext: No lower extremity edema  Imaging: DG Chest 2 View  Result Date: 02/22/2021 CLINICAL DATA:  Fall, altered mental status EXAM: CHEST - 2 VIEW COMPARISON:  10/15/2014  FINDINGS: Heart and mediastinal contours are within normal limits. No focal opacities or effusions. No acute bony abnormality. Aortic atherosclerosis. IMPRESSION: No active cardiopulmonary disease. Electronically Signed   By: Rolm Baptise M.D.   On: 02/22/2021 18:41   CT Head Wo Contrast  Result Date: 02/22/2021 CLINICAL DATA:  Head and neck trauma, fell EXAM: CT HEAD WITHOUT CONTRAST TECHNIQUE: Contiguous axial images were obtained from the base of the skull through the vertex without intravenous contrast. COMPARISON:  None. FINDINGS: Brain: No acute infarct or hemorrhage. Focal hypodensities within the periventricular white matter and bilateral basal ganglia most consistent with chronic small vessel ischemic change. Lateral ventricles and remaining midline structures are unremarkable. No acute extra-axial fluid collections. No mass effect. Vascular: No hyperdense vessel or unexpected calcification. Skull: Normal. Negative for fracture or focal lesion. Sinuses/Orbits: No acute finding.  Postsurgical changes left globe. Other: None. IMPRESSION: 1. No acute intracranial process. Electronically Signed   By: Randa Ngo M.D.   On: 02/22/2021 21:07   CT Cervical Spine Wo Contrast  Result Date: 02/22/2021 CLINICAL DATA:  Head and neck trauma, fell EXAM: CT CERVICAL SPINE WITHOUT CONTRAST TECHNIQUE: Multidetector CT imaging of the cervical spine was performed without intravenous contrast. Multiplanar CT image reconstructions were also generated. COMPARISON:  None. FINDINGS: Alignment: Alignment is anatomic. Skull base and vertebrae: No acute fracture. No primary bone lesion or focal pathologic process. Soft tissues and spinal canal: No prevertebral fluid or swelling. No visible canal hematoma. Disc levels: There is diffuse cervical spondylosis with prominent disc space  narrowing and osteophyte formation from C3-4 through C6-7. Bilateral neural foraminal encroachment is seen at these levels. Hypertrophic changes  at the C1-2 interface. Upper chest: Airway is patent. Likely diverticulum off the right posterolateral aspect of the trachea at the level of the thoracic inlet. Lung apices are clear. Other: Reconstructed images demonstrate no additional findings. IMPRESSION: 1. No acute cervical spine fracture. 2. Significant multilevel cervical spondylosis from C3-4 through C6-7, with associated neural foraminal encroachment. Electronically Signed   By: Randa Ngo M.D.   On: 02/22/2021 21:09   DG Knee Complete 4 Views Left  Result Date: 02/22/2021 CLINICAL DATA:  Fall with knee pain. EXAM: LEFT KNEE - COMPLETE 4+ VIEW COMPARISON:  None. FINDINGS: No evidence for an acute fracture. No subluxation or dislocation. No joint effusion. Calcification of the lateral meniscus noted. No worrisome lytic or sclerotic osseous abnormality. IMPRESSION: 1. No acute bony abnormality. 2. Chondrocalcinosis in the lateral meniscus. This can be associated with CPPD. Electronically Signed   By: Misty Stanley M.D.   On: 02/22/2021 18:40   DG Knee Complete 4 Views Right  Result Date: 02/22/2021 CLINICAL DATA:  Fall, knee pain EXAM: RIGHT KNEE - COMPLETE 4+ VIEW COMPARISON:  None. FINDINGS: Chondrocalcinosis. Joint spaces maintained. No acute bony abnormality. Specifically, no fracture, subluxation, or dislocation. No joint effusion. IMPRESSION: No acute bony abnormality. Electronically Signed   By: Rolm Baptise M.D.   On: 02/22/2021 18:41    Labs: BMET Recent Labs  Lab 02/19/21 1621 02/22/21 1744 02/22/21 1949 02/23/21 0103 02/23/21 0310 02/23/21 0428  NA 132* 111* 112* 116* 119* 120*  K 3.7 4.0 3.6  --  3.0*  --   CL 97* 81* 79*  --  85*  --   CO2 26 18* 20*  --  23  --   GLUCOSE 139* 96 96  --  84  --   BUN 17 24* 23  --  19  --   CREATININE 1.15 1.08 1.04  --  0.90  --   CALCIUM 9.8 8.7* 8.9  --  8.3*  --    CBC Recent Labs  Lab 02/19/21 1621 02/22/21 1744 02/23/21 0310  WBC 12.6* 18.2* 16.3*  NEUTROABS  --   14.5*  --   HGB 15.2 14.2 14.0  HCT 47.2 42.7 40.2  MCV 86.1 83.9 80.2  PLT 307 151 285    Medications:    . aspirin EC  81 mg Oral Daily  . Chlorhexidine Gluconate Cloth  6 each Topical Daily  . enoxaparin (LOVENOX) injection  40 mg Subcutaneous Q24H  . omega-3 acid ethyl esters  1 g Oral Daily  . simvastatin  20 mg Oral q1800  . vitamin E  1,000 Units Oral Daily   Elmarie Shiley, MD 02/23/2021, 7:40 AM

## 2021-02-23 NOTE — Progress Notes (Signed)
02/23/2021 8:36 PM [facility] MD Dr. Emmit Alexanders called back 2012 and asked me to notify Nephrology of NA level.  Dr. Posey Pronto notified and orders received at 2018 to notify him if NA is 116 or lower with any of the next draws through the night. [place]

## 2021-02-23 NOTE — ED Notes (Signed)
Report given to Stephanie RN.

## 2021-02-23 NOTE — Progress Notes (Signed)
NAME:  Jesse [name], MRN:  [id number], DOB:  03-May-1941, LOS: 1 ADMISSION DATE:  02/22/2021, CONSULTATION DATE:  02/23/21 REFERRING MD:  Jonelle Sidle CHIEF COMPLAINT:  Weakness, fall   History of Present Illness:  Jesse [name] is a 81 y.o. male who presented to [facility] ED 02/23/21 after a fall at home.  He had apparently had progressive weakness to the point he fell and landed on bilateral knees.  He did not hit his head and did lose consciousness. In ED, he was found to have Na 112.  He did not have any additional neurological symptoms.  He had informed ED team that he restricts his salt intake due to hx of HTN. Of note, he is also on HCTZ at baseline.    CT head was negative. Nephrology was consulted and recommended 3% NS.  Due to 3%, PCCM asked to see in consultation for ICU admission.  Pertinent  Medical History:  has Polycythemia rubra vera ([place]); HZ (herpes zoster); Need for prophylactic vaccination and inoculation against influenza; Hyponatremia; Essential hypertension; Hyperlipidemia; and Fall at home, initial encounter on their problem list.  [place] [facility] Events: Including procedures, antibiotic start and stop dates in addition to other pertinent events   6/3 > admit.  Micro Data: Flu 6/3 > neg. SARS CoV2 6/3 > neg.  Antibiotics: None.  Interim History / Subjective:  No acute distress 3% saline has been stopped D5W started  Objective:  Blood pressure 112/79, pulse 94, temperature 98.2 F (36.8 C), temperature source Oral, resp. rate (!) 23, height 5' 5.5" (1.664 m), weight 70.8 kg, SpO2 93 %.        Intake/Output Summary (Last 24 hours) at 02/23/2021 1136 Last data filed at 02/23/2021 0700 Gross per 24 hour  Intake 438.34 ml  Output 510 ml  Net -71.66 ml   Filed Weights   02/22/21 1759 02/23/21 0213  Weight: 72.1 kg 70.8 kg    Examination: General: Elderly male no acute distress HEENT: No lymphadenopathy or JVD is appreciated Neuro: Grossly intact without obvious focal  defects CV: Heart sounds are regular PULM: Room air no respiratory distress GI: soft, bsx4 active  GU: Amber urine Extremities: warm/dry, 1+ edema  Skin: no rashes or lesions   Labs/imaging personally reviewed:  CT head 6/3 > neg. R and L knee Xray 6/3 > neg.   Resolved Hospital Problem list:   Assessment & Plan:   Hyponatremia - multifactorial due to decreased PO intake and HCTZ.  He is s/p 1L NS and has been started on 3% NS per nephrology recs. Recent Labs  Lab 02/23/21 0428 02/23/21 0633 02/23/21 0950  NA 120* 120* 121*   Hyponatremia managed by nephrology Now off 3% saline Going on for D5W due to rapid rise in sodium   Fall - presumed 2/2 above. Agree with PT evaluation    Pulmonary critical care will be available as needed  Best practice (evaluated daily):  Diet:  Regular Pain/Anxiety/Delirium protocol (if indicated): No VAP protocol (if indicated): Not indicated DVT prophylaxis: LMWH GI prophylaxis: N/A Glucose control:  SSI No Central venous access:  N/A Arterial line:  N/A Foley:  N/A Mobility:  bed rest  PT consulted: Yes Last date of multidisciplinary goals of care discussion: None Code Status:  full code Disposition: ICU while on hypertonic saline. PCCM available as needed 6/3  Labs   CBC: Recent Labs  Lab 02/19/21 1621 02/22/21 1744 02/23/21 0310  WBC 12.6* 18.2* 16.3*  NEUTROABS  --  14.5*  --  HGB 15.2 14.2 14.0  HCT 47.2 42.7 40.2  MCV 86.1 83.9 80.2  PLT 307 151 071    Basic Metabolic Panel: Recent Labs  Lab 02/19/21 1621 02/22/21 1744 02/22/21 1949 02/23/21 0103 02/23/21 0310 02/23/21 0428 02/23/21 0633 02/23/21 0950  NA 132* 111* 112* 116* 119* 120* 120* 121*  K 3.7 4.0 3.6  --  3.0*  --   --   --   CL 97* 81* 79*  --  85*  --   --   --   CO2 26 18* 20*  --  23  --   --   --   GLUCOSE 139* 96 96  --  84  --   --   --   BUN 17 24* 23  --  19  --   --   --   CREATININE 1.15 1.08 1.04  --  0.90  --   --   --    CALCIUM 9.8 8.7* 8.9  --  8.3*  --   --   --   MG  --   --   --   --   --   --   --  1.8   GFR: Estimated Creatinine Clearance: 60 mL/min (by C-G formula based on SCr of 0.9 mg/dL). Recent Labs  Lab 02/19/21 1621 02/22/21 1744 02/23/21 0310  WBC 12.6* 18.2* 16.3*    Liver Function Tests: Recent Labs  Lab 02/19/21 1621 02/23/21 0310  AST 20 152*  ALT 20 70*  ALKPHOS 54 37*  BILITOT 0.9 2.9*  PROT 7.1 5.4*  ALBUMIN 4.0 2.8*   Recent Labs  Lab 02/19/21 1621  LIPASE 28   No results for input(s): AMMONIA in the last 168 hours.  ABG No results found for: PHART, PCO2ART, PO2ART, HCO3, TCO2, ACIDBASEDEF, O2SAT   Coagulation Profile: No results for input(s): INR, PROTIME in the last 168 hours.  Cardiac Enzymes: No results for input(s): CKTOTAL, CKMB, CKMBINDEX, TROPONINI in the last 168 hours.  HbA1C: No results found for: HGBA1C  CBG: No results for input(s): GLUCAP in the last 168 hours.    Critical care time: 0   Richardson Landry Findlay Dagher ACNP Acute Care Nurse Practitioner [place] Please consult Amion 02/23/2021, 11:38 AM

## 2021-02-23 NOTE — Progress Notes (Signed)
Paged Dr, Posey Pronto about patients rechecked sodium which was 116 he ordered ns a 75/hr 1200 cc fluid restriction and to call if follow sodium labs were lower or any neurological changes

## 2021-02-23 NOTE — Progress Notes (Addendum)
[facility] Progress Note Patient Name: Jesse [name] DOB: Apr 11, 1941 MRN: [id number]   Date of Service  02/23/2021  HPI/Events of Note  Patient with overly rapid correction of serum sodium. Serum K+ 3.0.  eICU Interventions  Hypertonic saline discontinued.KCL 10 meq iv Q 1 hour x 4 doses. D 5 % ordered at 50 ml / hour x 5 hours.        Kerry Kass Thorvald Orsino 02/23/2021, 4:35 AM

## 2021-02-23 NOTE — Consult Note (Signed)
Nephrology Consult   Requesting provider: Gala [facility] Service requesting consult: Hospitalist Reason for consult: Hyponatremia   Assessment/Recommendations: Jesse [name] is a/an 80 y.o. male with a past medical history hypertension, depression, hyperlipidemia, polycythemia vera  who present w/ severe symptomatic hyponatremia  Severe symptomatic hyponatremia: Sodium 132 on 5/30 abruptly dropped to 111 on 6/2.  Nearly acute but since it was greater than 48 hours between checks would be classified as chronic and treated as such.  Urine studies with high urine osmolality at 510 (urine sodium pending) but this was obtained after being on hypertonic saline so is of low utility. Likely a mix of effect from HCTZ as well as dehydration with possible low PO intake in the setting of his abdominal pain. Other possibility is SIADH related to pain. Right now is improving with therapy -Goal sodium 118-120 by 5pm today; this is a little more liberal than usual given the drop is sodium was fairly acute -Increase d5w to 100cc/hr -consider ddavp if Na increases further -Continue with potassium supplementation -Na checks every 2 hours -Continue with neuro checks -add on tsh and cortisol (unlikely to be culprits) -hold hctz  Hypokalemia: Continue with IV supplementation as needed  Leukocytosis: Unclear cause.  Possibly related to stress.  Work-up per primary team  LFT abnormality: AST and ALT elevated along with mild hyperbilirubinemia.  Work-up per primary team  Hypertension: History of such.  Hold home medications for now   Recommendations conveyed to primary service.    [facility] 02/23/2021 6:26 AM   _____________________________________________________________________________________ CC: Hyponatremia  History of Present Illness: Jesse [name] is a/an 80 y.o. male with a past medical history of hypertension, depression, hyperlipidemia, polycythemia vera who  presents with weakness and fall.  The patient was somewhat confused and history was mainly obtained per chart review.  The patient presented to the hospital on 5/30 with abdominal pain.  He also was in the emergency department several months ago with hydronephrosis and a renal cyst.  On 5/30 demonstrated mild hyponatremia with a sodium of 132, CT abdomen pelvis with chronic left kidney obstruction.  The patient was treated for his pain and discharged from the emergency department.  The patient returned to the emergency department yesterday after worsening weakness and a fall.  The patient described his weakness is generalized.  He does not remember being dizzy.  He did have some nausea and vomiting at home.  He states that he was taking his blood pressure medications regularly.  Denies any shortness of breath or chest pain.  No fevers or chills.  He does not think he is confused but does have difficulties answering questions directly.  In the emergency department his sodium was found to be 112.  His blood pressure was also slightly low at 94/72 and white blood cell count was 18.2.  He was given 1 L normal saline.  We were called and recommended starting 3% saline given the patient's symptomatic hyponatremia that was severe.  Patient was started on 30 cc/hr and sodium corrected rather quickly up to 119.  Hypertonic saline was stopped at 4 AM and he was started on 50 cc of d5w.  Sodium is most recently 120.  The patient states he feels a little bit better this morning with overall improved strength.       Medications:  Current Facility-Administered Medications  Medication Dose Route Frequency Provider Last Rate Last Admin  . ALPRAZolam (XANAX) tablet 0.5 mg  0.5 mg Oral QHS  PRN Elwyn Reach, MD      . aspirin EC tablet 81 mg  81 mg Oral Daily Elwyn Reach, MD      . Chlorhexidine Gluconate Cloth 2 % PADS 6 each  6 each Topical Daily Garba, Mohammad L, MD      . dextrose 5 % solution    Intravenous Continuous Reesa Chew, MD      . enoxaparin (LOVENOX) injection 40 mg  40 mg Subcutaneous Q24H Garba, Mohammad L, MD      . omega-3 acid ethyl esters (LOVAZA) capsule 1 g  1 g Oral Daily Garba, Mohammad L, MD      . ondansetron (ZOFRAN) tablet 4 mg  4 mg Oral Q6H PRN Elwyn Reach, MD       Or  . ondansetron (ZOFRAN) injection 4 mg  4 mg Intravenous Q6H PRN Jonelle Sidle, Mohammad L, MD      . potassium chloride 10 mEq in 100 mL IVPB  10 mEq Intravenous Q1 Hr x 4 Ogan, Okoronkwo U, MD 100 mL/hr at 02/23/21 0600 Infusion Verify at 02/23/21 0600  . simvastatin (ZOCOR) tablet 20 mg  20 mg Oral q1800 Elwyn Reach, MD      . vitamin E capsule 1,000 Units  1,000 Units Oral Daily Elwyn Reach, MD         ALLERGIES Tramadol  MEDICAL HISTORY Past Medical History:  Diagnosis Date  . Depression   . Hypercholesteremia   . Hypertension   . HZ (herpes zoster) 09/02/2011  . Neuritis    left foot  . Polycythemia rubra vera ([place]) 09/02/2011     SOCIAL HISTORY Social History   Socioeconomic History  . Marital status: Single    Spouse name: Not on file  . Number of children: Not on file  . Years of education: Not on file  . Highest education level: Not on file  Occupational History    Comment: retired  Tobacco Use  . Smoking status: Never Smoker  . Smokeless tobacco: Never Used  . Tobacco comment: never used tobacco  Vaping Use  . Vaping Use: Never used  Substance and Sexual Activity  . Alcohol use: No    Alcohol/week: 0.0 standard drinks  . Drug use: No  . Sexual activity: Not Currently  Other Topics Concern  . Not on file  Social History Narrative  . Not on file   Social Determinants of Health   Financial Resource Strain: Not on file  Food Insecurity: Not on file  Transportation Needs: Not on file  Physical Activity: Not on file  Stress: Not on file  Social Connections: Not on file  Intimate Partner Violence: Not on file     FAMILY  HISTORY Family History  Problem Relation Age of Onset  . Heart attack Mother       Review of Systems: 12 systems reviewed Otherwise as per HPI, all other systems reviewed and negative  Physical Exam: Vitals:   02/23/21 0515 02/23/21 0600  BP: 124/81 125/75  Pulse: 78 81  Resp: (!) 23 18  Temp:    SpO2: 95% 96%   Total I/O In: 289.6 [I.V.:210.7; IV Piggyback:78.9] Out: 510 [Urine:510]  Intake/Output Summary (Last 24 hours) at 02/23/2021 1308 Last data filed at 02/23/2021 0600 Gross per 24 hour  Intake 289.58 ml  Output 510 ml  Net -220.42 ml   General: well-appearing, no acute distress HEENT: anicteric sclera, oropharynx clear without lesions CV: Normal rate, no murmurs, no peripheral  edema Lungs: Bilateral chest rise with no increased work of breathing, clear to auscultation anteriorly Abd: soft, non-tender, non-distended Skin: no visible lesions or rashes Psych: alert, engaged, appropriate mood and affect Musculoskeletal: no obvious deformities Neuro: normal speech but difficulty answering more detailed questions, no other gross focal deficits   Test Results Reviewed Lab Results  Component Value Date   NA 120 (L) 02/23/2021   K 3.0 (L) 02/23/2021   CL 85 (L) 02/23/2021   CO2 23 02/23/2021   BUN 19 02/23/2021   CREATININE 0.90 02/23/2021   CALCIUM 8.3 (L) 02/23/2021   ALBUMIN 2.8 (L) 02/23/2021     I have reviewed all relevant outside healthcare records related to the patient's current hospitalization

## 2021-02-23 NOTE — Progress Notes (Signed)
Patient transported to 4E22 with 24M RN and on telemetry. All belongings with patient. Alerted 4E RN that patient still needs stool sample to be collected. Patient settled in 57E22 with RN and NT at bedside.

## 2021-02-24 LAB — RENAL FUNCTION PANEL
Albumin: 2.5 g/dL — ABNORMAL LOW (ref 3.5–5.0)
Anion gap: 10 (ref 5–15)
BUN: 18 mg/dL (ref 8–23)
CO2: 19 mmol/L — ABNORMAL LOW (ref 22–32)
Calcium: 8.3 mg/dL — ABNORMAL LOW (ref 8.9–10.3)
Chloride: 92 mmol/L — ABNORMAL LOW (ref 98–111)
Creatinine, Ser: 0.54 mg/dL — ABNORMAL LOW (ref 0.61–1.24)
GFR, Estimated: >60 mL/min (ref >60)
Glucose, Bld: 85 mg/dL (ref 70–99)
Phosphorus: 1.4 mg/dL — ABNORMAL LOW (ref 2.5–4.6)
Potassium: 3.4 mmol/L — ABNORMAL LOW (ref 3.5–5.1)
Sodium: 121 mmol/L — ABNORMAL LOW (ref 135–145)

## 2021-02-24 LAB — SODIUM: Sodium: 120 mmol/L — ABNORMAL LOW (ref 135–145)

## 2021-02-24 MED ORDER — POTASSIUM CHLORIDE CRYS ER 20 MEQ PO TBCR
30.0000 meq | EXTENDED_RELEASE_TABLET | Freq: Two times a day (BID) | ORAL | Status: AC
  Administered 2021-02-24 – 2021-02-25 (×3): 30 meq via ORAL
  Filled 2021-02-24 (×3): qty 1

## 2021-02-24 NOTE — TOC Initial Note (Signed)
Transition of Care [facility]) - Initial/Assessment Note    Patient Details  Name: Jesse [name] MRN: [id number] Date of Birth: [birth year]  Transition of [facility]) CM/SW Contact:    Bary Castilla, LCSW Phone Number: [phone] 02/24/2021, 3:11 PM  Clinical Narrative:                  CSW met with patient to discuss PT recommendation of a SNF. Patient was aware of recommendation and does not agree to go to SNF. Patient reports that he is going to OP PT and would remain doing so. Patient is agreeable to [facility] and states that he has PCA that comes in about 4 times a week. Patient reports that his sister is his care taker and lives with him.  TOC team will continue to assist with discharge planning needs.    Expected Discharge Plan: [place] Barriers to Discharge: Continued Medical Work up   Patient Goals and CMS Choice Patient states their goals for this hospitalization and ongoing recovery are:: To return home      Expected Discharge Plan and Services Expected Discharge Plan: [place]       Living arrangements for the past 2 months: Single Family Home                                      Prior Living Arrangements/Services Living arrangements for the past 2 months: Single Family Home Lives with:: Self,Siblings Patient language and need for interpreter reviewed:: Yes                 Activities of Daily Living      Permission Sought/Granted   Permission granted to share information with : Yes, Verbal Permission Granted  Share Information with NAME: Mardene Celeste  Permission granted to share info w AGENCY: [facility]  Permission granted to share info w Relationship: sister  Permission granted to share info w Contact Information: [phone]  Emotional Assessment Appearance:: Appears stated age Attitude/Demeanor/Rapport: Engaged Affect (typically observed): Accepting,Adaptable Orientation: : Oriented to Self,Oriented to  Place,Oriented to  Time,Oriented to Situation      Admission diagnosis:  Hyponatremia [E87.1] Fall, initial encounter [W19.XXXA] Patient Active Problem List   Diagnosis Date Noted  . Hyponatremia 02/22/2021  . Essential hypertension 02/22/2021  . Hyperlipidemia 02/22/2021  . Fall at home, initial encounter 02/22/2021  . Need for prophylactic vaccination and inoculation against influenza 06/17/2016  . Polycythemia rubra vera ([place]) 09/02/2011  . HZ (herpes zoster) 09/02/2011   PCP:  Wenda Low, MD Pharmacy:   [id number] [id number] [id number]: [phone]: [phone]    Social Determinants of Health (SDOH) Interventions    Readmission Risk Interventions No flowsheet data found.

## 2021-02-24 NOTE — Progress Notes (Signed)
PROGRESS NOTE    Jesse [name]  CWU:[id number] DOB: [birth year] DOA: 02/22/2021 PCP: Wenda Low, MD   Brief Narrative:  Jesse [name] is a 80 y.o. male who presented to [facility] ED 02/23/21 after a fall at home.  He had apparently had progressive weakness to the point he fell and landed on bilateral knees.  He did not hit his head and did lose consciousness. In ED, he was found to have Na 112.  He did not have any additional neurological symptoms.  He had informed ED team that he restricts his salt intake due to hx of HTN. Of note, he is also on HCTZ at baseline. CT head was negative. Nephrology was consulted and recommended 3% NS.  Due to 3%, PCCM asked to see in consultation for ICU admission. Labs improved overnight - 3% NS off and patient back to [facility] service as of 02/24/21.  Assessment & Plan:   Principal Problem:   Hyponatremia Active Problems:   Polycythemia rubra vera ([place])   Essential hypertension   Hyperlipidemia   Fall at home, initial encounter   Hyponatremia - multifactorial  Secondary to due to decreased PO intake and HCTZ. 3% NS off - fluid restrict and liberalize diet; holding HCTZ  Follow repeat labs  Mechanical Fall without syncope  - presumed 2/2 above. - PT eval ongoing - recommending HH/PT  Hx HTN, HLD. - Hold home Prinzide, Zestoretic. - Continue home Simvastatin.  Hx Polycythemia vera - F/u as outpatient.   Depression Insomnia - DC home alprazolam (listed as PRN for sleep) - high risk medication at his age - no usage here for >72h - No other antidepressants listed on home med list   DVT prophylaxis: Lovenox Code Status: Full Family Communication: None present  Status is: Inpatient  Dispo: The patient is from: Home              Anticipated d/c is to: Home with home health              Anticipated d/c date is: 48 to 72 hours              Patient currently not medically stable for discharge  Consultants:   PCCM, nephrology  Procedures:    None  Antimicrobials:  None  Subjective: No acute issues or events overnight  Objective: Vitals:   02/23/21 1856 02/23/21 2004 02/23/21 2337 02/24/21 0300  BP: 127/73 121/90 114/69 130/80  Pulse: 80 68 65 76  Resp: 20 20 20 18   Temp: 98.1 F (36.7 C) 98.5 F (36.9 C) 98.3 F (36.8 C) 98 F (36.7 C)  TempSrc: Oral Oral Oral Oral  SpO2: 98% 97% 96% 95%  Weight: 73.2 kg     Height: 5\' 5"  (1.651 m)       Intake/Output Summary (Last 24 hours) at 02/24/2021 0800 Last data filed at 02/23/2021 1740 Gross per 24 hour  Intake 240 ml  Output 225 ml  Net 15 ml   Filed Weights   02/22/21 1759 02/23/21 0213 02/23/21 1856  Weight: 72.1 kg 70.8 kg 73.2 kg    Examination:  General exam: Appears calm and comfortable  Respiratory system: Clear to auscultation. Respiratory effort normal. Cardiovascular system: S1 & S2 heard, RRR. No JVD, murmurs, rubs, gallops or clicks. No pedal edema. Gastrointestinal system: Abdomen is nondistended, soft and nontender. No organomegaly or masses felt. Normal bowel sounds heard. Central nervous system: Alert and oriented. No focal neurological deficits. Extremities: Symmetric 5 x 5 power. Skin:  No rashes, lesions or ulcers Psychiatry: Judgement and insight appear normal. Mood & affect appropriate.     Data Reviewed: I have personally reviewed following labs and imaging studies  CBC: Recent Labs  Lab 02/19/21 1621 02/22/21 1744 02/23/21 0310  WBC 12.6* 18.2* 16.3*  NEUTROABS  --  14.5*  --   HGB 15.2 14.2 14.0  HCT 47.2 42.7 40.2  MCV 86.1 83.9 80.2  PLT 307 151 315   Basic Metabolic Panel: Recent Labs  Lab 02/22/21 1744 02/22/21 1949 02/23/21 0103 02/23/21 0310 02/23/21 0428 02/23/21 0950 02/23/21 1203 02/23/21 1431 02/23/21 1821 02/23/21 2015 02/23/21 2201 02/24/21 0022 02/24/21 0137  NA 111* 112*   < > 119*   < > 121* 119*   < > 116* 116* 118* 120* 121*  K 4.0 3.6  --  3.0*  --   --  3.2*  --   --   --   --   --  3.4*   CL 81* 79*  --  85*  --   --  87*  --   --   --   --   --  92*  CO2 18* 20*  --  23  --   --  22  --   --   --   --   --  19*  GLUCOSE 96 96  --  84  --   --  100*  --   --   --   --   --  85  BUN 24* 23  --  19  --   --  19  --   --   --   --   --  18  CREATININE 1.08 1.04  --  0.90  --   --  0.79  --   --   --   --   --  0.54*  CALCIUM 8.7* 8.9  --  8.3*  --   --  8.6*  --   --   --   --   --  8.3*  MG  --   --   --   --   --  1.8  --   --   --   --   --   --   --   PHOS  --   --   --   --   --   --   --   --   --   --   --   --  1.4*   < > = values in this interval not displayed.   GFR: Estimated Creatinine Clearance: 66.2 mL/min (A) (by C-G formula based on SCr of 0.54 mg/dL (L)). Liver Function Tests: Recent Labs  Lab 02/19/21 1621 02/23/21 0310 02/24/21 0137  AST 20 152*  --   ALT 20 70*  --   ALKPHOS 54 37*  --   BILITOT 0.9 2.9*  --   PROT 7.1 5.4*  --   ALBUMIN 4.0 2.8* 2.5*   Recent Labs  Lab 02/19/21 1621  LIPASE 28   No results for input(s): AMMONIA in the last 168 hours. Coagulation Profile: No results for input(s): INR, PROTIME in the last 168 hours. Cardiac Enzymes: No results for input(s): CKTOTAL, CKMB, CKMBINDEX, TROPONINI in the last 168 hours. BNP (last 3 results) No results for input(s): PROBNP in the last 8760 hours. HbA1C: No results for input(s): HGBA1C in the last 72 hours. CBG: No results for input(s): GLUCAP in the last 168  hours. Lipid Profile: No results for input(s): CHOL, HDL, LDLCALC, TRIG, CHOLHDL, LDLDIRECT in the last 72 hours. Thyroid Function Tests: Recent Labs    02/23/21 0635  TSH 0.738   Anemia Panel: No results for input(s): VITAMINB12, FOLATE, FERRITIN, TIBC, IRON, RETICCTPCT in the last 72 hours. Sepsis Labs: No results for input(s): PROCALCITON, LATICACIDVEN in the last 168 hours.  Recent Results (from the past 240 hour(s))  Resp Panel by RT-PCR (Flu A&B, Covid) Nasopharyngeal Swab     Status: None   Collection  Time: 02/22/21  9:18 PM   Specimen: Nasopharyngeal Swab; Nasopharyngeal(NP) swabs in vial transport medium  Result Value Ref Range Status   SARS Coronavirus 2 by RT PCR NEGATIVE NEGATIVE Final    Comment: (NOTE) SARS-CoV-2 target nucleic acids are NOT DETECTED.  The SARS-CoV-2 RNA is generally detectable in upper respiratory specimens during the acute phase of infection. The lowest concentration of SARS-CoV-2 viral copies this assay can detect is 138 copies/mL. A negative result does not preclude SARS-Cov-2 infection and should not be used as the sole basis for treatment or other patient management decisions. A negative result may occur with  improper specimen collection/handling, submission of specimen other than nasopharyngeal swab, presence of viral mutation(s) within the areas targeted by this assay, and inadequate number of viral copies(<138 copies/mL). A negative result must be combined with clinical observations, patient history, and epidemiological information. The expected result is Negative.  Fact Sheet for Patients:  EntrepreneurPulse.com.au  Fact Sheet for Healthcare Providers:  IncredibleEmployment.be  This test is no t yet approved or cleared by the [place] FDA and  has been authorized for detection and/or diagnosis of SARS-CoV-2 by FDA under an Emergency Use Authorization (EUA). This EUA will remain  in effect (meaning this test can be used) for the duration of the COVID-19 declaration under Section 564(b)(1) of the Act, 21 U.S.C.section 360bbb-3(b)(1), unless the authorization is terminated  or revoked sooner.       Influenza A by PCR NEGATIVE NEGATIVE Final   Influenza B by PCR NEGATIVE NEGATIVE Final    Comment: (NOTE) The Xpert Xpress SARS-CoV-2/FLU/RSV plus assay is intended as an aid in the diagnosis of influenza from Nasopharyngeal swab specimens and should not be used as a sole basis for treatment. Nasal washings  and aspirates are unacceptable for Xpert Xpress SARS-CoV-2/FLU/RSV testing.  Fact Sheet for Patients: EntrepreneurPulse.com.au  Fact Sheet for Healthcare Providers: IncredibleEmployment.be  This test is not yet approved or cleared by the [place] FDA and has been authorized for detection and/or diagnosis of SARS-CoV-2 by FDA under an Emergency Use Authorization (EUA). This EUA will remain in effect (meaning this test can be used) for the duration of the COVID-19 declaration under Section 564(b)(1) of the Act, 21 U.S.C. section 360bbb-3(b)(1), unless the authorization is terminated or revoked.  Performed at [facility], [place] [address] [place], [place] [address]   MRSA PCR Screening     Status: None   Collection Time: 02/23/21  2:24 AM   Specimen: Nasal Mucosa; Nasopharyngeal  Result Value Ref Range Status   MRSA by PCR NEGATIVE NEGATIVE Final    Comment:        The GeneXpert MRSA Assay (FDA approved for NASAL specimens only), is one component of a comprehensive MRSA colonization surveillance program. It is not intended to diagnose MRSA infection nor to guide or monitor treatment for MRSA infections. Performed at [facility], [place] [address], [place], [place] [address]  Radiology Studies: DG Chest 2 View  Result Date: 02/22/2021 CLINICAL DATA:  Fall, altered mental status EXAM: CHEST - 2 VIEW COMPARISON:  10/15/2014 FINDINGS: Heart and mediastinal contours are within normal limits. No focal opacities or effusions. No acute bony abnormality. Aortic atherosclerosis. IMPRESSION: No active cardiopulmonary disease. Electronically Signed   By: Rolm Baptise M.D.   On: 02/22/2021 18:41   CT Head Wo Contrast  Result Date: 02/22/2021 CLINICAL DATA:  Head and neck trauma, fell EXAM: CT HEAD WITHOUT CONTRAST TECHNIQUE: Contiguous axial images were obtained from the base of the skull through the vertex without intravenous  contrast. COMPARISON:  None. FINDINGS: Brain: No acute infarct or hemorrhage. Focal hypodensities within the periventricular white matter and bilateral basal ganglia most consistent with chronic small vessel ischemic change. Lateral ventricles and remaining midline structures are unremarkable. No acute extra-axial fluid collections. No mass effect. Vascular: No hyperdense vessel or unexpected calcification. Skull: Normal. Negative for fracture or focal lesion. Sinuses/Orbits: No acute finding.  Postsurgical changes left globe. Other: None. IMPRESSION: 1. No acute intracranial process. Electronically Signed   By: Randa Ngo M.D.   On: 02/22/2021 21:07   CT Cervical Spine Wo Contrast  Result Date: 02/22/2021 CLINICAL DATA:  Head and neck trauma, fell EXAM: CT CERVICAL SPINE WITHOUT CONTRAST TECHNIQUE: Multidetector CT imaging of the cervical spine was performed without intravenous contrast. Multiplanar CT image reconstructions were also generated. COMPARISON:  None. FINDINGS: Alignment: Alignment is anatomic. Skull base and vertebrae: No acute fracture. No primary bone lesion or focal pathologic process. Soft tissues and spinal canal: No prevertebral fluid or swelling. No visible canal hematoma. Disc levels: There is diffuse cervical spondylosis with prominent disc space narrowing and osteophyte formation from C3-4 through C6-7. Bilateral neural foraminal encroachment is seen at these levels. Hypertrophic changes at the C1-2 interface. Upper chest: Airway is patent. Likely diverticulum off the right posterolateral aspect of the trachea at the level of the thoracic inlet. Lung apices are clear. Other: Reconstructed images demonstrate no additional findings. IMPRESSION: 1. No acute cervical spine fracture. 2. Significant multilevel cervical spondylosis from C3-4 through C6-7, with associated neural foraminal encroachment. Electronically Signed   By: Randa Ngo M.D.   On: 02/22/2021 21:09   DG Knee Complete  4 Views Left  Result Date: 02/22/2021 CLINICAL DATA:  Fall with knee pain. EXAM: LEFT KNEE - COMPLETE 4+ VIEW COMPARISON:  None. FINDINGS: No evidence for an acute fracture. No subluxation or dislocation. No joint effusion. Calcification of the lateral meniscus noted. No worrisome lytic or sclerotic osseous abnormality. IMPRESSION: 1. No acute bony abnormality. 2. Chondrocalcinosis in the lateral meniscus. This can be associated with CPPD. Electronically Signed   By: Misty Stanley M.D.   On: 02/22/2021 18:40   DG Knee Complete 4 Views Right  Result Date: 02/22/2021 CLINICAL DATA:  Fall, knee pain EXAM: RIGHT KNEE - COMPLETE 4+ VIEW COMPARISON:  None. FINDINGS: Chondrocalcinosis. Joint spaces maintained. No acute bony abnormality. Specifically, no fracture, subluxation, or dislocation. No joint effusion. IMPRESSION: No acute bony abnormality. Electronically Signed   By: Rolm Baptise M.D.   On: 02/22/2021 18:41        Scheduled Meds: . aspirin EC  81 mg Oral Daily  . Chlorhexidine Gluconate Cloth  6 each Topical Daily  . enoxaparin (LOVENOX) injection  40 mg Subcutaneous Q24H  . omega-3 acid ethyl esters  1 g Oral Daily  . simvastatin  20 mg Oral q1800  . vitamin E  1,000 Units Oral Daily  Continuous Infusions: . sodium chloride 75 mL/hr at 02/23/21 2207     LOS: 2 days   Time spent: 11min  Matricia Begnaud C Gabor Lusk, DO [facility]  If 7PM-7AM, please contact night-coverage [email or web address]  02/24/2021, 8:00 AM

## 2021-02-24 NOTE — Progress Notes (Signed)
Patient ID: Jesse [name], male   DOB: 1940/11/17, 80 y.o.   MRN: [id number] [facility] Progress Note   Assessment/ Plan:   1. Acute hyponatremia (likely acute on chronic based on previous labs): Based on work-up suspected to be from preceding thiazide use and volume contraction/hypotonic fluid intake.  Urine osmolality skewed by preceding administration of hypertonic saline and unable to rule out SIADH.  Earlier in hospitalization started on hypertonic saline with rapid rise of sodium following which she was transferred to the ICU and started on D5W.  Overnight, sodium level dropped after discontinuation of D5W and I started him on normal saline with gradual improvement of sodium levels corroborating clinical suspicion of intravascular volume contraction.  Continue normal saline at 100 cc/h suspecting additional losses from ongoing diarrhea.   2.  Hypokalemia: Likely secondary to poor intake and preceding losses from diuretics and now compounded by diarrhea.  Reorder potassium supplementation, magnesium levels are replete. 3.  Hypertension: Blood pressure currently at goal, would recommend avoiding thiazide indefinitely. 4.  Elevated LFTs: He appears to have elevated transaminases with evidence of synthetic hepatic dysfunction with low albumin/elevated bilirubin.  He denies alcohol use.  Subjective:   Bothered by diarrhea, feeling weak/washed out.   Objective:   BP 130/80 (BP Location: Right Arm)   Pulse 76   Temp 98 F (36.7 C) (Oral)   Resp 18   Ht 5\' 5"  (1.651 m)   Wt 73.2 kg   SpO2 95%   BMI 26.85 kg/m   Intake/Output Summary (Last 24 hours) at 02/24/2021 6237 Last data filed at 02/23/2021 1740 Gross per 24 hour  Intake 240 ml  Output 225 ml  Net 15 ml   Weight change: 1.078 kg  Physical Exam: Gen: Just returned back to bed from using bedside commode-appears fatigued CVS: Pulse regular rhythm, normal rate, S1 and S2 normal Resp: Clear to auscultation bilaterally,  no rales/rhonchi Abd: Soft, flat, nontender, bowel sounds normal Ext: No lower extremity edema  Imaging: DG Chest 2 View  Result Date: 02/22/2021 CLINICAL DATA:  Fall, altered mental status EXAM: CHEST - 2 VIEW COMPARISON:  10/15/2014 FINDINGS: Heart and mediastinal contours are within normal limits. No focal opacities or effusions. No acute bony abnormality. Aortic atherosclerosis. IMPRESSION: No active cardiopulmonary disease. Electronically Signed   By: Rolm Baptise M.D.   On: 02/22/2021 18:41   CT Head Wo Contrast  Result Date: 02/22/2021 CLINICAL DATA:  Head and neck trauma, fell EXAM: CT HEAD WITHOUT CONTRAST TECHNIQUE: Contiguous axial images were obtained from the base of the skull through the vertex without intravenous contrast. COMPARISON:  None. FINDINGS: Brain: No acute infarct or hemorrhage. Focal hypodensities within the periventricular white matter and bilateral basal ganglia most consistent with chronic small vessel ischemic change. Lateral ventricles and remaining midline structures are unremarkable. No acute extra-axial fluid collections. No mass effect. Vascular: No hyperdense vessel or unexpected calcification. Skull: Normal. Negative for fracture or focal lesion. Sinuses/Orbits: No acute finding.  Postsurgical changes left globe. Other: None. IMPRESSION: 1. No acute intracranial process. Electronically Signed   By: Randa Ngo M.D.   On: 02/22/2021 21:07   CT Cervical Spine Wo Contrast  Result Date: 02/22/2021 CLINICAL DATA:  Head and neck trauma, fell EXAM: CT CERVICAL SPINE WITHOUT CONTRAST TECHNIQUE: Multidetector CT imaging of the cervical spine was performed without intravenous contrast. Multiplanar CT image reconstructions were also generated. COMPARISON:  None. FINDINGS: Alignment: Alignment is anatomic. Skull base and vertebrae: No acute fracture. No primary  bone lesion or focal pathologic process. Soft tissues and spinal canal: No prevertebral fluid or swelling. No  visible canal hematoma. Disc levels: There is diffuse cervical spondylosis with prominent disc space narrowing and osteophyte formation from C3-4 through C6-7. Bilateral neural foraminal encroachment is seen at these levels. Hypertrophic changes at the C1-2 interface. Upper chest: Airway is patent. Likely diverticulum off the right posterolateral aspect of the trachea at the level of the thoracic inlet. Lung apices are clear. Other: Reconstructed images demonstrate no additional findings. IMPRESSION: 1. No acute cervical spine fracture. 2. Significant multilevel cervical spondylosis from C3-4 through C6-7, with associated neural foraminal encroachment. Electronically Signed   By: Randa Ngo M.D.   On: 02/22/2021 21:09   DG Knee Complete 4 Views Left  Result Date: 02/22/2021 CLINICAL DATA:  Fall with knee pain. EXAM: LEFT KNEE - COMPLETE 4+ VIEW COMPARISON:  None. FINDINGS: No evidence for an acute fracture. No subluxation or dislocation. No joint effusion. Calcification of the lateral meniscus noted. No worrisome lytic or sclerotic osseous abnormality. IMPRESSION: 1. No acute bony abnormality. 2. Chondrocalcinosis in the lateral meniscus. This can be associated with CPPD. Electronically Signed   By: Misty Stanley M.D.   On: 02/22/2021 18:40   DG Knee Complete 4 Views Right  Result Date: 02/22/2021 CLINICAL DATA:  Fall, knee pain EXAM: RIGHT KNEE - COMPLETE 4+ VIEW COMPARISON:  None. FINDINGS: Chondrocalcinosis. Joint spaces maintained. No acute bony abnormality. Specifically, no fracture, subluxation, or dislocation. No joint effusion. IMPRESSION: No acute bony abnormality. Electronically Signed   By: Rolm Baptise M.D.   On: 02/22/2021 18:41    Labs: [facility] Recent Labs  Lab 02/19/21 1621 02/22/21 1744 02/22/21 1949 02/23/21 0103 02/23/21 0310 02/23/21 0428 02/23/21 1203 02/23/21 1431 02/23/21 1649 02/23/21 1821 02/23/21 2015 02/23/21 2201 02/24/21 0022 02/24/21 0137  NA 132* 111* 112*    < > 119*   < > 119* 120* 120* 116* 116* 118* 120* 121*  K 3.7 4.0 3.6  --  3.0*  --  3.2*  --   --   --   --   --   --  3.4*  CL 97* 81* 79*  --  85*  --  87*  --   --   --   --   --   --  92*  CO2 26 18* 20*  --  23  --  22  --   --   --   --   --   --  19*  GLUCOSE 139* 96 96  --  84  --  100*  --   --   --   --   --   --  85  BUN 17 24* 23  --  19  --  19  --   --   --   --   --   --  18  CREATININE 1.15 1.08 1.04  --  0.90  --  0.79  --   --   --   --   --   --  0.54*  CALCIUM 9.8 8.7* 8.9  --  8.3*  --  8.6*  --   --   --   --   --   --  8.3*  PHOS  --   --   --   --   --   --   --   --   --   --   --   --   --  1.4*   < > = values in this interval not displayed.   CBC Recent Labs  Lab 02/19/21 1621 02/22/21 1744 02/23/21 0310  WBC 12.6* 18.2* 16.3*  NEUTROABS  --  14.5*  --   HGB 15.2 14.2 14.0  HCT 47.2 42.7 40.2  MCV 86.1 83.9 80.2  PLT 307 151 285    Medications:    . aspirin EC  81 mg Oral Daily  . Chlorhexidine Gluconate Cloth  6 each Topical Daily  . enoxaparin (LOVENOX) injection  40 mg Subcutaneous Q24H  . omega-3 acid ethyl esters  1 g Oral Daily  . simvastatin  20 mg Oral q1800  . vitamin E  1,000 Units Oral Daily   Elmarie Shiley, MD 02/24/2021, 9:38 AM

## 2021-02-25 LAB — RENAL FUNCTION PANEL
Albumin: 2.4 g/dL — ABNORMAL LOW (ref 3.5–5.0)
Anion gap: 8 (ref 5–15)
BUN: 15 mg/dL (ref 8–23)
CO2: 20 mmol/L — ABNORMAL LOW (ref 22–32)
Calcium: 8.5 mg/dL — ABNORMAL LOW (ref 8.9–10.3)
Chloride: 100 mmol/L (ref 98–111)
Creatinine, Ser: 0.61 mg/dL (ref 0.61–1.24)
GFR, Estimated: >60 mL/min (ref >60)
Glucose, Bld: 85 mg/dL (ref 70–99)
Phosphorus: 1.6 mg/dL — ABNORMAL LOW (ref 2.5–4.6)
Potassium: 3.9 mmol/L (ref 3.5–5.1)
Sodium: 128 mmol/L — ABNORMAL LOW (ref 135–145)

## 2021-02-25 LAB — GASTROINTESTINAL PANEL BY PCR, STOOL (REPLACES STOOL CULTURE)

## 2021-02-25 LAB — CBC
HCT: 42.5 % (ref 39.0–52.0)
Hemoglobin: 14.1 g/dL (ref 13.0–17.0)
MCH: 27.9 pg (ref 26.0–34.0)
MCHC: 33.2 g/dL (ref 30.0–36.0)
MCV: 84.2 fL (ref 80.0–100.0)
Platelets: 335 10*3/uL (ref 150–400)
RBC: 5.05 MIL/uL (ref 4.22–5.81)
RDW: 16.3 % — ABNORMAL HIGH (ref 11.5–15.5)
WBC: 13.3 10*3/uL — ABNORMAL HIGH (ref 4.0–10.5)
nRBC: 0 % (ref 0.0–0.2)

## 2021-02-25 MED ORDER — LISINOPRIL 20 MG PO TABS: 20.0000 mg | ORAL_TABLET | Freq: Every day | ORAL | 0 refills | Status: DC

## 2021-02-25 MED ORDER — LOPERAMIDE HCL 2 MG PO CAPS
2.0000 mg | ORAL_CAPSULE | ORAL | Status: DC | PRN
  Administered 2021-02-25: 2 mg via ORAL
  Filled 2021-02-25: qty 1

## 2021-02-25 MED ORDER — LOPERAMIDE HCL 2 MG PO CAPS: 2.0000 mg | ORAL_CAPSULE | ORAL | 0 refills | Status: DC | PRN

## 2021-02-25 NOTE — TOC Progression Note (Signed)
Transition of Care [facility]) - Progression Note    Patient Details  Name: Jesse [name] MRN: [id number] Date of Birth: [birth year]  Transition of Care [facility]) CM/SW [place],  Phone Number: [phone] 02/25/2021, 11:26 AM  Clinical Narrative:     CSW started [facility] and sent clinicals. Reference # -[id number]. Facility would need to give to [facility] once identified.  CSW attempted to call Kitty at [facility] however had to leave a VM.  TOC team will continue to assist with discharge planning needs.  Expected Discharge Plan: [place] Barriers to Discharge: Continued Medical Work up  Expected Discharge Plan and Services Expected Discharge Plan: [place]   Discharge Planning Services: CM Consult Post Acute Care Choice: [place] Living arrangements for the past 2 months: Single Family Home Expected Discharge Date: 02/25/21                                     Social Determinants of Health (SDOH) Interventions    Readmission Risk Interventions No flowsheet data found.

## 2021-02-25 NOTE — Discharge Summary (Signed)
Physician Discharge Summary  Jesse [name] IOE:[id number] DOB: 1941-05-02 DOA: 02/22/2021  PCP: Wenda Low, MD  Admit date: 02/22/2021 Discharge date: 02/27/2021  Admitted From: Home Disposition: SNF  Recommendations for Outpatient Follow-up:  1. Follow up with PCP in 1-2 weeks 2. Please obtain BMP/CBC in one week  Discharge Condition: Stable CODE STATUS: Full Diet recommendation: Low-fat diet  Brief/Interim Summary: Jesse [name] a 80 y.o.malewho presented to [facility] ED 02/23/21 after a fall at home. He had apparently had progressive weakness to the point he fell and landed on bilateral knees. He did not hit his head and did lose consciousness. In ED, he was found to have Na 112. He did not have any additional neurological symptoms. He had informed ED team that he restricts his salt intake due to hx of HTN. Of note, he is also on HCTZ at baseline. CT head was negative. Nephrology was consulted and recommended 3% NS. Due to 3%, PCCM asked to see in consultation for ICU admission. Labs improved overnight - 3% NS off and patient back to [facility] service as of 02/24/21.  Patient admitted as above with acute symptomatic hyponatremia in the setting of diarrhea and HCTZ diuretic.  After discontinuation of medications resolution of diarrhea and 3% normal saline patient's hyponatremia improved appropriately.  Patient has now been off IV fluids for more than 72 hours with uptrending sodium back to normal, noninfectious diarrhea given labs, continue treatment with supportive care and p.o. increased fluid intake.  At this time patient is otherwise stable and agreeable for discharge, PT OT evaluation recommended SNF placement given his ongoing weakness and currently lives alone at home with very little support.  Ultimate plan is to return home after therapy, follow-up with PCP in 1 to 2 weeks as scheduled for repeat labs.  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[place])   Essential hypertension   Hyperlipidemia   Fall at home, initial encounter    Discharge Instructions  Discharge Instructions    Call MD for:  extreme fatigue   Complete by: As directed    Call MD for:  persistant nausea and vomiting   Complete by: As directed    Diet - low sodium heart healthy   Complete by: As directed    Diet general   Complete by: As directed    Increase activity slowly   Complete by: As directed    Increase activity slowly   Complete by: As directed      Allergies as of 02/27/2021      Reactions   Tramadol Nausea And Vomiting      Medication List    STOP taking these medications   ALPRAZolam 1 MG tablet Commonly known as: XANAX   lisinopril-hydrochlorothiazide 20-12.5 MG tablet Commonly known as: ZESTORETIC     TAKE these medications   aspirin EC 81 MG tablet Take 81 mg by mouth daily.   fish oil-omega-3 fatty acids 1000 MG capsule Take 1 g by mouth daily.   lisinopril 20 MG tablet Commonly known as: ZESTRIL Take 1 tablet (20 mg total) by mouth daily.   loperamide 2 MG capsule Commonly known as: IMODIUM Take 1 capsule (2 mg total) by mouth as needed for diarrhea or loose stools.   ondansetron 4 MG disintegrating tablet Commonly known as: Zofran ODT Take 1 tablet (4 mg total) by mouth every 8 (eight) hours as needed for up to 15 doses for nausea or vomiting.  Pfizer-BioNT COVID-19 Vac-TriS Susp injection Generic drug: COVID-19 mRNA Vac-TriS (Pfizer) USE AS DIRECTED   simvastatin 20 MG tablet Commonly known as: ZOCOR Take 20 mg by mouth daily at 6 PM.   vitamin E 1000 UNIT capsule Take 1,000 Units by mouth daily.       Contact information for after-discharge care    Destination    [facility] Preferred SNF .   Service: Skilled Nursing Contact information: [address] [address] [phone]                 Allergies  Allergen  Reactions  . Tramadol Nausea And Vomiting    Consultations:  PCCM, nephrology   Procedures/Studies: CT ABDOMEN PELVIS WO CONTRAST  Result Date: 02/19/2021 CLINICAL DATA:  Left lower quadrant pain for several hours, initial encounter EXAM: CT ABDOMEN AND PELVIS WITHOUT CONTRAST TECHNIQUE: Multidetector CT imaging of the abdomen and pelvis was performed following the standard protocol without IV contrast. COMPARISON:  12/03/2020 FINDINGS: Lower chest: Lung bases are free of acute infiltrate or sizable effusion. Hepatobiliary: No focal liver abnormality is seen. No gallstones, gallbladder wall thickening, or biliary dilatation. Pancreas: Unremarkable. No pancreatic ductal dilatation or surrounding inflammatory changes. Spleen: Normal in size without focal abnormality. Adrenals/Urinary Tract: Adrenal glands are within normal limits. Right kidney demonstrates some mild perinephric stranding stable from the prior study. No obstructive changes are seen. The left kidney demonstrates some cortical cysts stable from the prior exam. Significant perinephric stranding is noted similar to that seen on the prior exam. No calculi are noted. The collecting system is prominent with a prominent extrarenal pelvis identified. This was reported as prominent cyst in the renal sinus on prior exam however a given the appearance this is felt to represent dilated renal pelvis. These changes have the appearance of a chronic UPJ obstruction. The ureter is well visualized and within normal limits. No calculi are seen. The bladder is well distended. Stomach/Bowel: Mild diverticular change of the colon is noted. No obstructive or inflammatory changes of the colon are noted. The appendix is well visualized and within normal limits. Small bowel and stomach appear unremarkable. Vascular/Lymphatic: Aortic atherosclerosis. No enlarged abdominal or pelvic lymph nodes. Reproductive: Prostate is unremarkable. Other: No abdominal wall hernia or  abnormality. No abdominopelvic ascites. Musculoskeletal: Degenerative changes of the lumbar spine are noted. IMPRESSION: Findings in the left kidney most consistent with a chronic UPJ obstruction. The overall appearance is similar to that seen on the prior exam. This has been previously reported as a large renal pelvic cyst although the collecting system inter communicates with this consistent with hydronephrosis. Distal ureter on the left is within normal limits. Diverticulosis without diverticulitis. No other focal abnormality is noted. Electronically Signed   By: Inez Catalina M.D.   On: 02/19/2021 16:58   DG Chest 2 View  Result Date: 02/22/2021 CLINICAL DATA:  Fall, altered mental status EXAM: CHEST - 2 VIEW COMPARISON:  10/15/2014 FINDINGS: Heart and mediastinal contours are within normal limits. No focal opacities or effusions. No acute bony abnormality. Aortic atherosclerosis. IMPRESSION: No active cardiopulmonary disease. Electronically Signed   By: Rolm Baptise M.D.   On: 02/22/2021 18:41   CT Head Wo Contrast  Result Date: 02/22/2021 CLINICAL DATA:  Head and neck trauma, fell EXAM: CT HEAD WITHOUT CONTRAST TECHNIQUE: Contiguous axial images were obtained from the base of the skull through the vertex without intravenous contrast. COMPARISON:  None. FINDINGS: Brain: No acute infarct or hemorrhage. Focal  hypodensities within the periventricular white matter and bilateral basal ganglia most consistent with chronic small vessel ischemic change. Lateral ventricles and remaining midline structures are unremarkable. No acute extra-axial fluid collections. No mass effect. Vascular: No hyperdense vessel or unexpected calcification. Skull: Normal. Negative for fracture or focal lesion. Sinuses/Orbits: No acute finding.  Postsurgical changes left globe. Other: None. IMPRESSION: 1. No acute intracranial process. Electronically Signed   By: Randa Ngo M.D.   On: 02/22/2021 21:07   CT Cervical Spine Wo  Contrast  Result Date: 02/22/2021 CLINICAL DATA:  Head and neck trauma, fell EXAM: CT CERVICAL SPINE WITHOUT CONTRAST TECHNIQUE: Multidetector CT imaging of the cervical spine was performed without intravenous contrast. Multiplanar CT image reconstructions were also generated. COMPARISON:  None. FINDINGS: Alignment: Alignment is anatomic. Skull base and vertebrae: No acute fracture. No primary bone lesion or focal pathologic process. Soft tissues and spinal canal: No prevertebral fluid or swelling. No visible canal hematoma. Disc levels: There is diffuse cervical spondylosis with prominent disc space narrowing and osteophyte formation from C3-4 through C6-7. Bilateral neural foraminal encroachment is seen at these levels. Hypertrophic changes at the C1-2 interface. Upper chest: Airway is patent. Likely diverticulum off the right posterolateral aspect of the trachea at the level of the thoracic inlet. Lung apices are clear. Other: Reconstructed images demonstrate no additional findings. IMPRESSION: 1. No acute cervical spine fracture. 2. Significant multilevel cervical spondylosis from C3-4 through C6-7, with associated neural foraminal encroachment. Electronically Signed   By: Randa Ngo M.D.   On: 02/22/2021 21:09   DG Knee Complete 4 Views Left  Result Date: 02/22/2021 CLINICAL DATA:  Fall with knee pain. EXAM: LEFT KNEE - COMPLETE 4+ VIEW COMPARISON:  None. FINDINGS: No evidence for an acute fracture. No subluxation or dislocation. No joint effusion. Calcification of the lateral meniscus noted. No worrisome lytic or sclerotic osseous abnormality. IMPRESSION: 1. No acute bony abnormality. 2. Chondrocalcinosis in the lateral meniscus. This can be associated with CPPD. Electronically Signed   By: Misty Stanley M.D.   On: 02/22/2021 18:40   DG Knee Complete 4 Views Right  Result Date: 02/22/2021 CLINICAL DATA:  Fall, knee pain EXAM: RIGHT KNEE - COMPLETE 4+ VIEW COMPARISON:  None. FINDINGS:  Chondrocalcinosis. Joint spaces maintained. No acute bony abnormality. Specifically, no fracture, subluxation, or dislocation. No joint effusion. IMPRESSION: No acute bony abnormality. Electronically Signed   By: Rolm Baptise M.D.   On: 02/22/2021 18:41     Subjective: No acute issues or events overnight   Discharge Exam: Vitals:   02/26/21 2339 02/27/21 0357  BP: 121/81 112/83  Pulse: 76 93  Resp: 16 17  Temp: 98.3 F (36.8 C) 98.3 F (36.8 C)  SpO2: 98% 98%   Vitals:   02/26/21 1311 02/26/21 2048 02/26/21 2339 02/27/21 0357  BP: (!) 150/85 130/79 121/81 112/83  Pulse: 72 74 76 93  Resp: 20 18 16 17   Temp: 97.9 F (36.6 C) 98 F (36.7 C) 98.3 F (36.8 C) 98.3 F (36.8 C)  TempSrc: Oral Oral Oral Oral  SpO2: 96% 98% 98% 98%  Weight:      Height:        General: Pt is alert, awake, not in acute distress Cardiovascular: RRR, S1/S2 +, no rubs, no gallops Respiratory: CTA bilaterally, no wheezing, no rhonchi Abdominal: Soft, NT, ND, bowel sounds + Extremities: no edema, no cyanosis    The results of significant diagnostics from this hospitalization (including imaging, microbiology, ancillary and laboratory) are listed below  for reference.     Microbiology: Recent Results (from the past 240 hour(s))  Resp Panel by RT-PCR (Flu A&B, Covid) Nasopharyngeal Swab     Status: None   Collection Time: 02/22/21  9:18 PM   Specimen: Nasopharyngeal Swab; Nasopharyngeal(NP) swabs in vial transport medium  Result Value Ref Range Status   SARS Coronavirus 2 by RT PCR NEGATIVE NEGATIVE Final    Comment: (NOTE) SARS-CoV-2 target nucleic acids are NOT DETECTED.  The SARS-CoV-2 RNA is generally detectable in upper respiratory specimens during the acute phase of infection. The lowest concentration of SARS-CoV-2 viral copies this assay can detect is 138 copies/mL. A negative result does not preclude SARS-Cov-2 infection and should not be used as the sole basis for treatment or other  patient management decisions. A negative result may occur with  improper specimen collection/handling, submission of specimen other than nasopharyngeal swab, presence of viral mutation(s) within the areas targeted by this assay, and inadequate number of viral copies(<138 copies/mL). A negative result must be combined with clinical observations, patient history, and epidemiological information. The expected result is Negative.  Fact Sheet for Patients:  EntrepreneurPulse.com.au  Fact Sheet for Healthcare Providers:  IncredibleEmployment.be  This test is no t yet approved or cleared by the [place] FDA and  has been authorized for detection and/or diagnosis of SARS-CoV-2 by FDA under an Emergency Use Authorization (EUA). This EUA will remain  in effect (meaning this test can be used) for the duration of the COVID-19 declaration under Section 564(b)(1) of the Act, 21 U.S.C.section 360bbb-3(b)(1), unless the authorization is terminated  or revoked sooner.       Influenza A by PCR NEGATIVE NEGATIVE Final   Influenza B by PCR NEGATIVE NEGATIVE Final    Comment: (NOTE) The Xpert Xpress SARS-CoV-2/FLU/RSV plus assay is intended as an aid in the diagnosis of influenza from Nasopharyngeal swab specimens and should not be used as a sole basis for treatment. Nasal washings and aspirates are unacceptable for Xpert Xpress SARS-CoV-2/FLU/RSV testing.  Fact Sheet for Patients: EntrepreneurPulse.com.au  Fact Sheet for Healthcare Providers: IncredibleEmployment.be  This test is not yet approved or cleared by the [place] FDA and has been authorized for detection and/or diagnosis of SARS-CoV-2 by FDA under an Emergency Use Authorization (EUA). This EUA will remain in effect (meaning this test can be used) for the duration of the COVID-19 declaration under Section 564(b)(1) of the Act, 21 U.S.C. section  360bbb-3(b)(1), unless the authorization is terminated or revoked.  Performed at [facility], [place] [address] [place], [place] [address]   MRSA PCR Screening     Status: None   Collection Time: 02/23/21  2:24 AM   Specimen: Nasal Mucosa; Nasopharyngeal  Result Value Ref Range Status   MRSA by PCR NEGATIVE NEGATIVE Final    Comment:        The GeneXpert MRSA Assay (FDA approved for NASAL specimens only), is one component of a comprehensive MRSA colonization surveillance program. It is not intended to diagnose MRSA infection nor to guide or monitor treatment for MRSA infections. Performed at [facility], [place] [address], [place], [place] [address]   Gastrointestinal Panel by PCR , Stool     Status: None   Collection Time: 02/23/21  3:57 PM   Specimen: Stool  Result Value Ref Range Status   Campylobacter species NOT DETECTED NOT DETECTED Final   Plesimonas shigelloides NOT DETECTED NOT DETECTED Final   Salmonella species NOT DETECTED NOT DETECTED Final  Yersinia enterocolitica NOT DETECTED NOT DETECTED Final   Vibrio species NOT DETECTED NOT DETECTED Final   Vibrio cholerae NOT DETECTED NOT DETECTED Final   Enteroaggregative E coli (EAEC) NOT DETECTED NOT DETECTED Final   Enteropathogenic E coli (EPEC) NOT DETECTED NOT DETECTED Final   Enterotoxigenic E coli (ETEC) NOT DETECTED NOT DETECTED Final   Shiga like toxin producing E coli (STEC) NOT DETECTED NOT DETECTED Final   Shigella/Enteroinvasive E coli (EIEC) NOT DETECTED NOT DETECTED Final   Cryptosporidium NOT DETECTED NOT DETECTED Final   Cyclospora cayetanensis NOT DETECTED NOT DETECTED Final   Entamoeba histolytica NOT DETECTED NOT DETECTED Final   Giardia lamblia NOT DETECTED NOT DETECTED Final   Adenovirus F40/41 NOT DETECTED NOT DETECTED Final   Astrovirus NOT DETECTED NOT DETECTED Final   Norovirus GI/GII NOT DETECTED NOT DETECTED Final   Rotavirus A NOT DETECTED NOT DETECTED Final   Sapovirus (I, II,  IV, and V) NOT DETECTED NOT DETECTED Final    Comment: Performed at [facility], [address] [place], [place] [address]  SARS CORONAVIRUS 2 (TAT 6-24 HRS) Nasopharyngeal Nasopharyngeal Swab     Status: None   Collection Time: 02/25/21  4:29 PM   Specimen: Nasopharyngeal Swab  Result Value Ref Range Status   SARS Coronavirus 2 NEGATIVE NEGATIVE Final    Comment: (NOTE) SARS-CoV-2 target nucleic acids are NOT DETECTED.  The SARS-CoV-2 RNA is generally detectable in upper and lower respiratory specimens during the acute phase of infection. Negative results do not preclude SARS-CoV-2 infection, do not rule out co-infections with other pathogens, and should not be used as the sole basis for treatment or other patient management decisions. Negative results must be combined with clinical observations, patient history, and epidemiological information. The expected result is Negative.  Fact Sheet for Patients: SugarRoll.be  Fact Sheet for Healthcare Providers: https://www.woods-mathews.com/  This test is not yet approved or cleared by the [place] FDA and  has been authorized for detection and/or diagnosis of SARS-CoV-2 by FDA under an Emergency Use Authorization (EUA). This EUA will remain  in effect (meaning this test can be used) for the duration of the COVID-19 declaration under Se ction 564(b)(1) of the Act, 21 U.S.C. section 360bbb-3(b)(1), unless the authorization is terminated or revoked sooner.  Performed at [facility], [place] [address], [place], [place] [address]      Labs: BNP (last 3 results) No results for input(s): BNP in the last 8760 hours. Basic Metabolic Panel: Recent Labs  Lab 02/23/21 0950 02/23/21 1203 02/23/21 1431 02/24/21 0022 02/24/21 0137 02/25/21 0557 02/26/21 0419 02/27/21 0149  NA 121* 119*   < > 120* 121* 128* 131* 129*  K  --  3.2*  --   --  3.4* 3.9 3.9 3.5  CL  --  87*  --    --  92* 100 104 100  CO2  --  22  --   --  19* 20* 20* 19*  GLUCOSE  --  100*  --   --  85 85 101* 97  BUN  --  19  --   --  18 15 19  25*  CREATININE  --  0.79  --   --  0.54* 0.61 0.76 0.74  CALCIUM  --  8.6*  --   --  8.3* 8.5* 9.1 9.2  MG 1.8  --   --   --   --   --   --   --   PHOS  --   --   --   --  1.4* 1.6* 2.8 3.5   < > = values in this interval not displayed.   Liver Function Tests: Recent Labs  Lab 02/23/21 0310 02/24/21 0137 02/25/21 0557 02/26/21 0419 02/27/21 0149  AST 152*  --   --   --   --   ALT 70*  --   --   --   --   ALKPHOS 37*  --   --   --   --   BILITOT 2.9*  --   --   --   --   PROT 5.4*  --   --   --   --   ALBUMIN 2.8* 2.5* 2.4* 2.5* 2.6*   No results for input(s): LIPASE, AMYLASE in the last 168 hours. No results for input(s): AMMONIA in the last 168 hours. CBC: Recent Labs  Lab 02/22/21 1744 02/23/21 0310 02/25/21 0557  WBC 18.2* 16.3* 13.3*  NEUTROABS 14.5*  --   --   HGB 14.2 14.0 14.1  HCT 42.7 40.2 42.5  MCV 83.9 80.2 84.2  PLT 151 285 335   Cardiac Enzymes: No results for input(s): CKTOTAL, CKMB, CKMBINDEX, TROPONINI in the last 168 hours. BNP: Invalid input(s): POCBNP CBG: No results for input(s): GLUCAP in the last 168 hours. D-Dimer No results for input(s): DDIMER in the last 72 hours. Hgb A1c No results for input(s): HGBA1C in the last 72 hours. Lipid Profile No results for input(s): CHOL, HDL, LDLCALC, TRIG, CHOLHDL, LDLDIRECT in the last 72 hours. Thyroid function studies No results for input(s): TSH, T4TOTAL, T3FREE, THYROIDAB in the last 72 hours.  Invalid input(s): FREET3 Anemia work up No results for input(s): VITAMINB12, FOLATE, FERRITIN, TIBC, IRON, RETICCTPCT in the last 72 hours. Urinalysis    Component Value Date/Time   COLORURINE YELLOW 02/23/2021 0102   APPEARANCEUR CLEAR 02/23/2021 0102   LABSPEC 1.015 02/23/2021 0102   PHURINE 6.0 02/23/2021 0102   GLUCOSEU NEGATIVE 02/23/2021 0102   HGBUR SMALL (A)  02/23/2021 0102   BILIRUBINUR NEGATIVE 02/23/2021 0102   KETONESUR 20 (A) 02/23/2021 0102   PROTEINUR NEGATIVE 02/23/2021 0102   UROBILINOGEN 0.2 10/15/2014 1825   NITRITE NEGATIVE 02/23/2021 0102   LEUKOCYTESUR NEGATIVE 02/23/2021 0102   Sepsis Labs Invalid input(s): PROCALCITONIN,  WBC,  LACTICIDVEN Microbiology Recent Results (from the past 240 hour(s))  Resp Panel by RT-PCR (Flu A&B, Covid) Nasopharyngeal Swab     Status: None   Collection Time: 02/22/21  9:18 PM   Specimen: Nasopharyngeal Swab; Nasopharyngeal(NP) swabs in vial transport medium  Result Value Ref Range Status   SARS Coronavirus 2 by RT PCR NEGATIVE NEGATIVE Final    Comment: (NOTE) SARS-CoV-2 target nucleic acids are NOT DETECTED.  The SARS-CoV-2 RNA is generally detectable in upper respiratory specimens during the acute phase of infection. The lowest concentration of SARS-CoV-2 viral copies this assay can detect is 138 copies/mL. A negative result does not preclude SARS-Cov-2 infection and should not be used as the sole basis for treatment or other patient management decisions. A negative result may occur with  improper specimen collection/handling, submission of specimen other than nasopharyngeal swab, presence of viral mutation(s) within the areas targeted by this assay, and inadequate number of viral copies(<138 copies/mL). A negative result must be combined with clinical observations, patient history, and epidemiological information. The expected result is Negative.  Fact Sheet for Patients:  EntrepreneurPulse.com.au  Fact Sheet for Healthcare Providers:  IncredibleEmployment.be  This test is no t yet approved or cleared by the [place] and  has been authorized for detection and/or diagnosis of SARS-CoV-2 by FDA under an Emergency Use Authorization (EUA). This EUA will remain  in effect (meaning this test can be used) for the duration of the COVID-19  declaration under Section 564(b)(1) of the Act, 21 U.S.C.section 360bbb-3(b)(1), unless the authorization is terminated  or revoked sooner.       Influenza A by PCR NEGATIVE NEGATIVE Final   Influenza B by PCR NEGATIVE NEGATIVE Final    Comment: (NOTE) The Xpert Xpress SARS-CoV-2/FLU/RSV plus assay is intended as an aid in the diagnosis of influenza from Nasopharyngeal swab specimens and should not be used as a sole basis for treatment. Nasal washings and aspirates are unacceptable for Xpert Xpress SARS-CoV-2/FLU/RSV testing.  Fact Sheet for Patients: EntrepreneurPulse.com.au  Fact Sheet for Healthcare Providers: IncredibleEmployment.be  This test is not yet approved or cleared by the [place] FDA and has been authorized for detection and/or diagnosis of SARS-CoV-2 by FDA under an Emergency Use Authorization (EUA). This EUA will remain in effect (meaning this test can be used) for the duration of the COVID-19 declaration under Section 564(b)(1) of the Act, 21 U.S.C. section 360bbb-3(b)(1), unless the authorization is terminated or revoked.  Performed at [facility], [place] [address] [place], [place] [address]   MRSA PCR Screening     Status: None   Collection Time: 02/23/21  2:24 AM   Specimen: Nasal Mucosa; Nasopharyngeal  Result Value Ref Range Status   MRSA by PCR NEGATIVE NEGATIVE Final    Comment:        The GeneXpert MRSA Assay (FDA approved for NASAL specimens only), is one component of a comprehensive MRSA colonization surveillance program. It is not intended to diagnose MRSA infection nor to guide or monitor treatment for MRSA infections. Performed at [facility], [place] [address], [place], [place] [address]   Gastrointestinal Panel by PCR , Stool     Status: None   Collection Time: 02/23/21  3:57 PM   Specimen: Stool  Result Value Ref Range Status   Campylobacter species NOT DETECTED NOT DETECTED Final    Plesimonas shigelloides NOT DETECTED NOT DETECTED Final   Salmonella species NOT DETECTED NOT DETECTED Final   Yersinia enterocolitica NOT DETECTED NOT DETECTED Final   Vibrio species NOT DETECTED NOT DETECTED Final   Vibrio cholerae NOT DETECTED NOT DETECTED Final   Enteroaggregative E coli (EAEC) NOT DETECTED NOT DETECTED Final   Enteropathogenic E coli (EPEC) NOT DETECTED NOT DETECTED Final   Enterotoxigenic E coli (ETEC) NOT DETECTED NOT DETECTED Final   Shiga like toxin producing E coli (STEC) NOT DETECTED NOT DETECTED Final   Shigella/Enteroinvasive E coli (EIEC) NOT DETECTED NOT DETECTED Final   Cryptosporidium NOT DETECTED NOT DETECTED Final   Cyclospora cayetanensis NOT DETECTED NOT DETECTED Final   Entamoeba histolytica NOT DETECTED NOT DETECTED Final   Giardia lamblia NOT DETECTED NOT DETECTED Final   Adenovirus F40/41 NOT DETECTED NOT DETECTED Final   Astrovirus NOT DETECTED NOT DETECTED Final   Norovirus GI/GII NOT DETECTED NOT DETECTED Final   Rotavirus A NOT DETECTED NOT DETECTED Final   Sapovirus (I, II, IV, and V) NOT DETECTED NOT DETECTED Final    Comment: Performed at [facility], [address] [place], [place] [address]  SARS CORONAVIRUS 2 (TAT 6-24 HRS) Nasopharyngeal Nasopharyngeal Swab     Status: None   Collection Time: 02/25/21  4:29 PM   Specimen: Nasopharyngeal Swab  Result Value Ref Range Status  SARS Coronavirus 2 NEGATIVE NEGATIVE Final    Comment: (NOTE) SARS-CoV-2 target nucleic acids are NOT DETECTED.  The SARS-CoV-2 RNA is generally detectable in upper and lower respiratory specimens during the acute phase of infection. Negative results do not preclude SARS-CoV-2 infection, do not rule out co-infections with other pathogens, and should not be used as the sole basis for treatment or other patient management decisions. Negative results must be combined with clinical observations, patient history, and epidemiological information. The  expected result is Negative.  Fact Sheet for Patients: SugarRoll.be  Fact Sheet for Healthcare Providers: https://www.woods-mathews.com/  This test is not yet approved or cleared by the [place] FDA and  has been authorized for detection and/or diagnosis of SARS-CoV-2 by FDA under an Emergency Use Authorization (EUA). This EUA will remain  in effect (meaning this test can be used) for the duration of the COVID-19 declaration under Se ction 564(b)(1) of the Act, 21 U.S.C. section 360bbb-3(b)(1), unless the authorization is terminated or revoked sooner.  Performed at [facility], [place] [address] [place], [place] [address]      Time coordinating discharge: Over 30 minutes  SIGNED:   Little Ishikawa, DO [facility] 02/27/2021, 7:41 AM Pager   If 7PM-7AM, please contact night-coverage [email or web address]

## 2021-02-25 NOTE — Progress Notes (Signed)
Patient ID: Jesse [name], male   DOB: 03-19-1941, 80 y.o.   MRN: [id number] [facility] Progress Note   Assessment/ Plan:   1. Acute hyponatremia (likely acute on chronic based on previous labs): This appears to have been from intravascular volume contraction with ongoing hydrochlorothiazide use-would avoid thiazides indefinitely for him.  Earlier in hospitalization was on hypertonic saline with a rapid rise of sodium level leading to transient observation in the ICU on D5W.  I consequently transitioned him to normal saline with continued improvement of sodium level seen on labs this morning.  Would continue normal saline until diarrhea resolves.   2.  Hypokalemia: Likely secondary to poor intake and preceding losses from diuretics and now compounded by diarrhea.  Improved with potassium supplementation, magnesium levels are replete. 3.  Hypertension: Blood pressure currently at goal, would recommend avoiding thiazide indefinitely. 4.  Diarrhea: On enteric precautions at this time as we await stool studies.  Subjective:   Reports that he had multiple episodes of diarrhea overnight and complains of abdominal distention without pain.   Objective:   BP 134/76 (BP Location: Right Arm)   Pulse 70   Temp 97.9 F (36.6 C) (Oral)   Resp 18   Ht 5\' 5"  (1.651 m)   Wt 73.2 kg   SpO2 100%   BMI 26.85 kg/m  No intake or output data in the 24 hours ending 02/25/21 0823 Weight change:   Physical Exam: Gen: Resting comfortably in bed CVS: Pulse regular rhythm, normal rate, S1 and S2 normal Resp: Clear to auscultation bilaterally, no rales/rhonchi Abd: Soft, distended (tympanitic on percussion), nontender, bowel sounds loud Ext: No lower extremity edema  Imaging: No results found.  Labs: BMET Recent Labs  Lab 02/19/21 1621 02/22/21 1744 02/22/21 1949 02/23/21 0103 02/23/21 0310 02/23/21 0428 02/23/21 1203 02/23/21 1431 02/23/21 1649 02/23/21 1821 02/23/21 2015  02/23/21 2201 02/24/21 0022 02/24/21 0137 02/25/21 0557  NA 132* 111* 112*   < > 119*   < > 119*   < > 120* 116* 116* 118* 120* 121* 128*  K 3.7 4.0 3.6  --  3.0*  --  3.2*  --   --   --   --   --   --  3.4* 3.9  CL 97* 81* 79*  --  85*  --  87*  --   --   --   --   --   --  92* 100  CO2 26 18* 20*  --  23  --  22  --   --   --   --   --   --  19* 20*  GLUCOSE 139* 96 96  --  84  --  100*  --   --   --   --   --   --  85 85  BUN 17 24* 23  --  19  --  19  --   --   --   --   --   --  18 15  CREATININE 1.15 1.08 1.04  --  0.90  --  0.79  --   --   --   --   --   --  0.54* 0.61  CALCIUM 9.8 8.7* 8.9  --  8.3*  --  8.6*  --   --   --   --   --   --  8.3* 8.5*  PHOS  --   --   --   --   --   --   --   --   --   --   --   --   --  1.4* 1.6*   < > = values in this interval not displayed.   CBC Recent Labs  Lab 02/19/21 1621 02/22/21 1744 02/23/21 0310 02/25/21 0557  WBC 12.6* 18.2* 16.3* 13.3*  NEUTROABS  --  14.5*  --   --   HGB 15.2 14.2 14.0 14.1  HCT 47.2 42.7 40.2 42.5  MCV 86.1 83.9 80.2 84.2  PLT 307 151 285 335    Medications:    . aspirin EC  81 mg Oral Daily  . Chlorhexidine Gluconate Cloth  6 each Topical Daily  . enoxaparin (LOVENOX) injection  40 mg Subcutaneous Q24H  . omega-3 acid ethyl esters  1 g Oral Daily  . potassium chloride  30 mEq Oral BID  . simvastatin  20 mg Oral q1800  . vitamin E  1,000 Units Oral Daily   Elmarie Shiley, MD 02/25/2021, 8:23 AM

## 2021-02-25 NOTE — Progress Notes (Signed)
PROGRESS NOTE    Jesse [name]  RCV:[id number] DOB: 09/14/1941 DOA: 02/22/2021 PCP: Wenda Low, MD   Brief Narrative:  Jesse [name] is a 80 y.o. male who presented to [facility] ED 02/23/21 after a fall at home.  He had apparently had progressive weakness to the point he fell and landed on bilateral knees.  He did not hit his head and did lose consciousness. In ED, he was found to have Na 112.  He did not have any additional neurological symptoms.  He had informed ED team that he restricts his salt intake due to hx of HTN. Of note, he is also on HCTZ at baseline. CT head was negative. Nephrology was consulted and recommended 3% NS.  Due to 3%, PCCM asked to see in consultation for ICU admission. Labs improved overnight - 3% NS off and patient back to [facility] service as of 02/24/21.  Sodium improving with fluid restriction and discontinuation of HCTZ.  Initial plan for discharge on 02/26/2019 to home with home health therapy.  After further discussion with family and patient he has now opted to discharge to SNF per previous recommendations, currently awaiting placement but otherwise medically stable for discharge.  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 3% NS off - fluid restrict and liberalize diet; holding HCTZ  Sodium returning to baseline  Mechanical Fall without syncope  - presumed 2/2 above. - PT eval ongoing - recommending HH/PT  Hx HTN, HLD. - Hold home Prinzide, Zestoretic. - Continue home Simvastatin.  Hx Polycythemia vera - F/u as outpatient.   Depression Insomnia - DC home alprazolam (listed as PRN for sleep) - high risk medication at his age - no usage here for >72h - No other antidepressants listed on home med list   DVT prophylaxis: Lovenox Code  Status: Full Family Communication: None present  Status is: Inpatient  Dispo: The patient is from: Home              Anticipated d/c is to: SNF              Anticipated d/c date is: Imminent pending safe dispo              Patient currently IS medically stable for discharge  Consultants:   PCCM, nephrology  Procedures:   None  Antimicrobials:  None  Subjective: No acute issues or events overnight  Objective: Vitals:   02/25/21 0143 02/25/21 0658 02/25/21 0810 02/25/21 1616  BP: 115/86  134/76 140/86  Pulse: 70 69 70 78  Resp: 20  18 19   Temp: 98.3 F (36.8 C)  97.9 F (36.6 C) 97.6 F (36.4 C)  TempSrc: Oral Oral Oral Oral  SpO2: 100%  100% 99%  Weight:      Height:       No intake or output data in the 24 hours ending 02/25/21 1635 Filed Weights   02/22/21 1759 02/23/21 0213 02/23/21 1856  Weight: 72.1 kg 70.8 kg 73.2 kg    Examination:  General exam: Appears calm and comfortable  Respiratory system: Clear to auscultation. Respiratory effort normal. Cardiovascular system: S1 & S2 heard, RRR. No JVD, murmurs, rubs, gallops or clicks. No pedal edema. Gastrointestinal system: Abdomen is nondistended, soft and nontender. No organomegaly or masses felt.  Normal bowel sounds heard. Central nervous system: Alert and oriented. No focal neurological deficits. Extremities: Symmetric 5 x 5 power. Skin: No rashes, lesions or ulcers Psychiatry: Judgement and insight appear normal. Mood & affect appropriate.     Data Reviewed: I have personally reviewed following labs and imaging studies  CBC: Recent Labs  Lab 02/19/21 1621 02/22/21 1744 02/23/21 0310 02/25/21 0557  WBC 12.6* 18.2* 16.3* 13.3*  NEUTROABS  --  14.5*  --   --   HGB 15.2 14.2 14.0 14.1  HCT 47.2 42.7 40.2 42.5  MCV 86.1 83.9 80.2 84.2  PLT 307 151 285 008   Basic Metabolic Panel: Recent Labs  Lab 02/22/21 1949 02/23/21 0103 02/23/21 0310 02/23/21 0428 02/23/21 0950 02/23/21 1203  02/23/21 1431 02/23/21 2015 02/23/21 2201 02/24/21 0022 02/24/21 0137 02/25/21 0557  NA 112*   < > 119*   < > 121* 119*   < > 116* 118* 120* 121* 128*  K 3.6  --  3.0*  --   --  3.2*  --   --   --   --  3.4* 3.9  CL 79*  --  85*  --   --  87*  --   --   --   --  92* 100  CO2 20*  --  23  --   --  22  --   --   --   --  19* 20*  GLUCOSE 96  --  84  --   --  100*  --   --   --   --  85 85  BUN 23  --  19  --   --  19  --   --   --   --  18 15  CREATININE 1.04  --  0.90  --   --  0.79  --   --   --   --  0.54* 0.61  CALCIUM 8.9  --  8.3*  --   --  8.6*  --   --   --   --  8.3* 8.5*  MG  --   --   --   --  1.8  --   --   --   --   --   --   --   PHOS  --   --   --   --   --   --   --   --   --   --  1.4* 1.6*   < > = values in this interval not displayed.   GFR: Estimated Creatinine Clearance: 66.2 mL/min (by C-G formula based on SCr of 0.61 mg/dL). Liver Function Tests: Recent Labs  Lab 02/19/21 1621 02/23/21 0310 02/24/21 0137 02/25/21 0557  AST 20 152*  --   --   ALT 20 70*  --   --   ALKPHOS 54 37*  --   --   BILITOT 0.9 2.9*  --   --   PROT 7.1 5.4*  --   --   ALBUMIN 4.0 2.8* 2.5* 2.4*   Recent Labs  Lab 02/19/21 1621  LIPASE 28   No results for input(s): AMMONIA in the last 168 hours. Coagulation Profile: No results for input(s): INR, PROTIME in the last 168 hours. Cardiac Enzymes: No results for input(s): CKTOTAL, CKMB, CKMBINDEX, TROPONINI in the last 168 hours. BNP (last 3 results) No results for input(s): PROBNP in the last 8760 hours. HbA1C: No results for input(s): HGBA1C  in the last 72 hours. CBG: No results for input(s): GLUCAP in the last 168 hours. Lipid Profile: No results for input(s): CHOL, HDL, LDLCALC, TRIG, CHOLHDL, LDLDIRECT in the last 72 hours. Thyroid Function Tests: Recent Labs    02/23/21 0635  TSH 0.738   Anemia Panel: No results for input(s): VITAMINB12, FOLATE, FERRITIN, TIBC, IRON, RETICCTPCT in the last 72 hours. Sepsis  Labs: No results for input(s): PROCALCITON, LATICACIDVEN in the last 168 hours.  Recent Results (from the past 240 hour(s))  Resp Panel by RT-PCR (Flu A&B, Covid) Nasopharyngeal Swab     Status: None   Collection Time: 02/22/21  9:18 PM   Specimen: Nasopharyngeal Swab; Nasopharyngeal(NP) swabs in vial transport medium  Result Value Ref Range Status   SARS Coronavirus 2 by RT PCR NEGATIVE NEGATIVE Final    Comment: (NOTE) SARS-CoV-2 target nucleic acids are NOT DETECTED.  The SARS-CoV-2 RNA is generally detectable in upper respiratory specimens during the acute phase of infection. The lowest concentration of SARS-CoV-2 viral copies this assay can detect is 138 copies/mL. A negative result does not preclude SARS-Cov-2 infection and should not be used as the sole basis for treatment or other patient management decisions. A negative result may occur with  improper specimen collection/handling, submission of specimen other than nasopharyngeal swab, presence of viral mutation(s) within the areas targeted by this assay, and inadequate number of viral copies(<138 copies/mL). A negative result must be combined with clinical observations, patient history, and epidemiological information. The expected result is Negative.  Fact Sheet for Patients:  EntrepreneurPulse.com.au  Fact Sheet for Healthcare Providers:  IncredibleEmployment.be  This test is no t yet approved or cleared by the [place] FDA and  has been authorized for detection and/or diagnosis of SARS-CoV-2 by FDA under an Emergency Use Authorization (EUA). This EUA will remain  in effect (meaning this test can be used) for the duration of the COVID-19 declaration under Section 564(b)(1) of the Act, 21 U.S.C.section 360bbb-3(b)(1), unless the authorization is terminated  or revoked sooner.       Influenza A by PCR NEGATIVE NEGATIVE Final   Influenza B by PCR NEGATIVE NEGATIVE Final     Comment: (NOTE) The Xpert Xpress SARS-CoV-2/FLU/RSV plus assay is intended as an aid in the diagnosis of influenza from Nasopharyngeal swab specimens and should not be used as a sole basis for treatment. Nasal washings and aspirates are unacceptable for Xpert Xpress SARS-CoV-2/FLU/RSV testing.  Fact Sheet for Patients: EntrepreneurPulse.com.au  Fact Sheet for Healthcare Providers: IncredibleEmployment.be  This test is not yet approved or cleared by the [place] FDA and has been authorized for detection and/or diagnosis of SARS-CoV-2 by FDA under an Emergency Use Authorization (EUA). This EUA will remain in effect (meaning this test can be used) for the duration of the COVID-19 declaration under Section 564(b)(1) of the Act, 21 U.S.C. section 360bbb-3(b)(1), unless the authorization is terminated or revoked.  Performed at [facility], [place] [address], [place], [place] [address]   MRSA PCR Screening     Status: None   Collection Time: 02/23/21  2:24 AM   Specimen: Nasal Mucosa; Nasopharyngeal  Result Value Ref Range Status   MRSA by PCR NEGATIVE NEGATIVE Final    Comment:        The GeneXpert MRSA Assay (FDA approved for NASAL specimens only), is one component of a comprehensive MRSA colonization surveillance program. It is not intended to diagnose MRSA infection nor to guide or monitor treatment for MRSA infections. Performed  at [facility], [place] [address] [place], [place] [address]   Gastrointestinal Panel by PCR , Stool     Status: None   Collection Time: 02/23/21  3:57 PM   Specimen: Stool  Result Value Ref Range Status   Campylobacter species NOT DETECTED NOT DETECTED Final   Plesimonas shigelloides NOT DETECTED NOT DETECTED Final   Salmonella species NOT DETECTED NOT DETECTED Final   Yersinia enterocolitica NOT DETECTED NOT DETECTED Final   Vibrio species NOT DETECTED NOT DETECTED Final   Vibrio cholerae NOT  DETECTED NOT DETECTED Final   Enteroaggregative E coli (EAEC) NOT DETECTED NOT DETECTED Final   Enteropathogenic E coli (EPEC) NOT DETECTED NOT DETECTED Final   Enterotoxigenic E coli (ETEC) NOT DETECTED NOT DETECTED Final   Shiga like toxin producing E coli (STEC) NOT DETECTED NOT DETECTED Final   Shigella/Enteroinvasive E coli (EIEC) NOT DETECTED NOT DETECTED Final   Cryptosporidium NOT DETECTED NOT DETECTED Final   Cyclospora cayetanensis NOT DETECTED NOT DETECTED Final   Entamoeba histolytica NOT DETECTED NOT DETECTED Final   Giardia lamblia NOT DETECTED NOT DETECTED Final   Adenovirus F40/41 NOT DETECTED NOT DETECTED Final   Astrovirus NOT DETECTED NOT DETECTED Final   Norovirus GI/GII NOT DETECTED NOT DETECTED Final   Rotavirus A NOT DETECTED NOT DETECTED Final   Sapovirus (I, II, IV, and V) NOT DETECTED NOT DETECTED Final    Comment: Performed at [facility], [address] [place], [place] [address]         Radiology Studies: No results found.      Scheduled Meds: . aspirin EC  81 mg Oral Daily  . Chlorhexidine Gluconate Cloth  6 each Topical Daily  . enoxaparin (LOVENOX) injection  40 mg Subcutaneous Q24H  . omega-3 acid ethyl esters  1 g Oral Daily  . simvastatin  20 mg Oral q1800  . vitamin E  1,000 Units Oral Daily   Continuous Infusions:    LOS: 3 days   Time spent: 79min  Craig Wisnewski C Suha Schoenbeck, DO [facility]  If 7PM-7AM, please contact night-coverage [email or web address]  02/25/2021, 4:35 PM

## 2021-02-25 NOTE — NC FL2 (Signed)
[place] LEVEL OF CARE SCREENING TOOL     IDENTIFICATION  Patient Name: Jesse [name] Birthdate: 03/10/1941 Sex: male Admission Date (Current Location): 02/22/2021  [facility] [place] Number:  Herbalist and Address:  [facility] [place], [place] [address], [place], [place] [address]      Provider Number: [id number]  Attending Physician Name and Address:  Little Ishikawa, MD  Relative Name and Phone Number:       Current Level of Care: Hospital Recommended Level of Care: [place] Prior Approval Number:    Date Approved/Denied:   PASRR Number: [id number]  Discharge Plan: SNF    Current Diagnoses: Patient Active Problem List   Diagnosis Date Noted  . Hyponatremia 02/22/2021  . Essential hypertension 02/22/2021  . Hyperlipidemia 02/22/2021  . Fall at home, initial encounter 02/22/2021  . Need for prophylactic vaccination and inoculation against influenza 06/17/2016  . 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 HZ (herpes zoster) 09/02/2011    Orientation RESPIRATION BLADDER Height & Weight     Self,Time,Situation,Place  Normal Incontinent Weight: 161 lb 6 oz (73.2 kg) Height:  5\' 5"  (165.1 cm)  BEHAVIORAL SYMPTOMS/MOOD NEUROLOGICAL BOWEL NUTRITION STATUS      Incontinent Diet (please see discharge summary)  AMBULATORY STATUS COMMUNICATION OF NEEDS Skin   Limited Assist   Normal                       Personal Care Assistance Level of Assistance  Bathing,Feeding,Dressing Bathing Assistance: Limited assistance Feeding assistance: Independent Dressing Assistance: Limited assistance     Functional Limitations Info  Sight,Hearing,Speech Sight Info: Adequate Hearing Info: Adequate Speech Info: Adequate    SPECIAL CARE FACTORS FREQUENCY  PT (By licensed PT),OT (By licensed OT)     PT Frequency: 5x per week OT Frequency: 5x per week            Contractures Contractures Info: Not present     Additional Factors Info  Code Status,Allergies Code Status Info: FULL Allergies Info: Tramadol           Current Medications (02/25/2021):  This is the current hospital active medication list Current Facility-Administered Medications  Medication Dose Route Frequency Provider Last Rate Last Admin  . aspirin EC tablet 81 mg  81 mg Oral Daily Elwyn Reach, MD   81 mg at 02/25/21 0859  . Chlorhexidine Gluconate Cloth 2 % PADS 6 each  6 each Topical Daily Elwyn Reach, MD   6 each at 02/25/21 0900  . enoxaparin (LOVENOX) injection 40 mg  40 mg Subcutaneous Q24H Gala Romney L, MD   40 mg at 02/24/21 0919  . loperamide (IMODIUM) capsule 2 mg  2 mg Oral PRN Little Ishikawa, MD   2 mg at 02/25/21 0911  . omega-3 acid ethyl esters (LOVAZA) capsule 1 g  1 g Oral Daily Elwyn Reach, MD   1 g at 02/25/21 0859  . ondansetron (ZOFRAN) tablet 4 mg  4 mg Oral Q6H PRN Elwyn Reach, MD       Or  . ondansetron (ZOFRAN) injection 4 mg  4 mg Intravenous Q6H PRN Elwyn Reach, MD   4 mg at 02/25/21 0505  . simvastatin (ZOCOR) tablet 20 mg  20 mg Oral q1800 Elwyn Reach, MD   20 mg at 02/24/21 1833  . vitamin E capsule 1,000 Units  1,000 Units Oral Daily Elwyn Reach, MD  1,000 Units at 02/25/21 5697     Discharge Medications: Please see discharge summary for a list of discharge medications.  Relevant Imaging Results:  Relevant Lab Results:   Additional Information SSN [id number]  PFIZER Comrnaty([place] TOP) Covid-19 Vaccine 11/29/2020  Vinie Sill, LCSW

## 2021-02-25 NOTE — TOC Progression Note (Signed)
Transition of Care [facility]) - Progression Note    Patient Details  Name: Jesse [name] MRN: [id number] Date of Birth: May 31, 1941  Transition of Care [facility]) CM/SW Contact  Graves-Bigelow, Ocie Cornfield, RN Phone Number: 02/25/2021, 10:05 AM  Clinical Narrative:   Case Manager spoke with patient regarding home needs this morning. 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 Per PT notes patient is only walking 25 feet. Patients sister is in the room along with her daughter. Per patient, he states the sister has personal care services in the home. Case Manager discussed who would assist him in the home if he was set up with home health- the patient would not have additional assistance to aide him in the home. Patient is agreeable to SNF for rehab for strength and endurance training. Family is in the room and wants to look at SNF options. CSW is aware and is assisting with plan of care.   Expected Discharge Plan: [place] Barriers to Discharge: Continued Medical Work up  Expected Discharge Plan and Services Expected Discharge Plan: [place]   Discharge Planning Services: CM Consult Post Acute Care Choice: [place] Living arrangements for the past 2 months: Single Family Home Expected Discharge Date: 02/25/21                                     Social Determinants of Health (SDOH) Interventions    Readmission Risk Interventions No flowsheet data found.

## 2021-02-25 NOTE — TOC Progression Note (Signed)
Transition of Care [facility]) - Progression Note    Patient Details  Name: Jesse [name] MRN: [id number] Date of Birth: [birth year]  Transition of Care [facility]) CM/SW [place], [place] Phone Number: [phone] 02/25/2021, 10:11 AM  Clinical Narrative:     CSW was alerted by RN Case Manager that family is in pt's room and now he is agreeable to SNF. CSW spoke with pt and now he want to go to SNF. Pt's sister Mardene Celeste was present and she spoke with pt for need for him to go. Pt has asked that Mardene Celeste makes the decision. CSW received permission to send out to local SNF. Patient has been vaccinated and had one booster. Family prefers [facility] [facility].  MD updated about disposition plan.  TOC team will continue to assist with discharge planning needs.   Expected Discharge Plan: [place] Barriers to Discharge: Continued Medical Work up  Expected Discharge Plan and Services Expected Discharge Plan: [place]   Discharge Planning Services: CM Consult Post Acute Care Choice: [place] Living arrangements for the past 2 months: Single Family Home Expected Discharge Date: 02/25/21                                     Social Determinants of Health (SDOH) Interventions    Readmission Risk Interventions No flowsheet data found.

## 2021-02-26 LAB — RENAL FUNCTION PANEL
Albumin: 2.5 g/dL — ABNORMAL LOW (ref 3.5–5.0)
Anion gap: 7 (ref 5–15)
BUN: 19 mg/dL (ref 8–23)
CO2: 20 mmol/L — ABNORMAL LOW (ref 22–32)
Calcium: 9.1 mg/dL (ref 8.9–10.3)
Chloride: 104 mmol/L (ref 98–111)
Creatinine, Ser: 0.76 mg/dL (ref 0.61–1.24)
GFR, Estimated: >60 mL/min (ref >60)
Glucose, Bld: 101 mg/dL — ABNORMAL HIGH (ref 70–99)
Phosphorus: 2.8 mg/dL (ref 2.5–4.6)
Potassium: 3.9 mmol/L (ref 3.5–5.1)
Sodium: 131 mmol/L — ABNORMAL LOW (ref 135–145)

## 2021-02-26 LAB — SARS CORONAVIRUS 2 (TAT 6-24 HRS): SARS Coronavirus 2: NEGATIVE

## 2021-02-26 NOTE — Care Management Important Message (Signed)
Important Message  Patient Details  Name: Jesse [name] MRN: [id number] Date of Birth: Apr 28, 1941   Medicare Important Message Given:  Yes     Shelda Altes 02/26/2021, 10:55 AM

## 2021-02-26 NOTE — TOC Progression Note (Addendum)
Transition of Care [facility]) - Progression Note    Patient Details  Name: Jesse [name] MRN: [id number] Date of Birth: [birth year]  Transition of Care [facility]) CM/SW [place], [place] Phone Number: 02/26/2021, 1:59 PM  Clinical Narrative:     CSW received confirmation from [facility], they can admit tomorrow-CSW updated SNF and patient's friend,[name]  Received insurance auth # [id number] from 06/07-06/09 covid test still pending   Thurmond Butts, MSW, LCSW Clinical Social Worker   Expected Discharge Plan: [place] Barriers to Discharge: Continued Medical Work up  Expected Discharge Plan and Services Expected Discharge Plan: [place]   Discharge Planning Services: CM Consult Post Acute Care Choice: [place] arrangements for the past 2 months: Single Family Home Expected Discharge Date: 02/25/21                                     Social Determinants of Health (SDOH) Interventions    Readmission Risk Interventions No flowsheet data found.

## 2021-02-26 NOTE — Progress Notes (Signed)
Physical Therapy Treatment Patient Details Name: Jesse [name] MRN: [id number] DOB: 1941/09/10 Today's Date: 02/26/2021    History of Present Illness Jesse [name] is a 80 y.o. male who presented to [facility] ED 02/23/21 after a fall at home.  He had apparently had progressive weakness to the point he fell and landed on bilateral knees.  He did not hit his head and did lose consciousness. Pt with positive hyponatremia and hypokalemia. PMH: HTN.    PT Comments    Pt received in supine, agreeable to therapy session and with good participation and tolerance for gait and transfer training. Pt needing min to modA for functional mobility tasks using RW. BP noted to be elevated seated/standing and RN notified. Pt continues to benefit from PT services to progress toward functional mobility goals. Continue to recommend SNF.   Follow Up Recommendations  SNF     Equipment Recommendations  None recommended by PT    Recommendations for Other Services       Precautions / Restrictions Precautions Precautions: Fall Restrictions Weight Bearing Restrictions: No    Mobility  Bed Mobility Overal bed mobility: Needs Assistance Bed Mobility: Supine to Sit     Supine to sit: Mod assist     General bed mobility comments: cues for log roll sequencing but needs HHA for full trunk rise and increased time/cues to perform; notably poor core strength    Transfers Overall transfer level: Needs assistance Equipment used: Rolling walker (2 wheeled) Transfers: Sit to/from Stand Sit to Stand: Min assist;From elevated surface         General transfer comment: Asssit for power up and steadying once up, needs bed height elevated ~3" initially to stand, improved standing from chair with arm rest support; 5 total reps  Ambulation/Gait Ambulation/Gait assistance: Min assist Gait Distance (Feet): 70 Feet (19ft, 46ft with seated break) Assistive device: Rolling walker (2 wheeled) Gait Pattern/deviations:  Decreased stride length;Decreased step length - right;Decreased step length - left;Step-through pattern;Drifts right/left;Narrow base of support Gait velocity: decreased; grossly <0.2 m/s Gait velocity interpretation: <1.8 ft/sec, indicate of risk for recurrent falls General Gait Details: Briefs donned prior to OOB mobility due to reported previous bowel incontinence (pt also with condom cath this date). Pt with poor safety awareness with RW and needed cues to stay close to it and some physical assist to manage RW especially when turning; decreased B foot clearance and postural sway.   Stairs             Wheelchair Mobility    Modified Rankin (Stroke Patients Only)       Balance Overall balance assessment: Needs assistance Sitting-balance support: No upper extremity supported;Feet supported Sitting balance-Leahy Scale: Fair Sitting balance - Comments: static sitting >5 mins no LOB but does have posterior lean initially, needs cues/assist to scoot anterior to foot flat then demos fair balance   Standing balance support: Bilateral upper extremity supported;During functional activity Standing balance-Leahy Scale: Poor Standing balance comment: relies heavily on UE support as well as external support                            Cognition Arousal/Alertness: Awake/alert Behavior During Therapy: WFL for tasks assessed/performed Overall Cognitive Status: Impaired/Different from baseline Area of Impairment: Following commands;Awareness;Problem solving                       Following Commands: Follows one step commands with increased time  Problem Solving: Slow processing;Difficulty sequencing;Requires verbal cues General Comments: Pt lacks safety awareness, needs safety cues each transfer and sequencing cues; chair alarm set for safety at end of session      Exercises      General Comments General comments (skin integrity, edema, etc.): HR 84-101 with  exertion, RR 21, BP 146/87 (105), elevated more in standing      Pertinent Vitals/Pain Pain Assessment: No/denies pain    Home Living Family/patient expects to be discharged to:: Private residence Living Arrangements: Other relatives                  Prior Function            PT Goals (current goals can now be found in the care plan section) Acute Rehab PT Goals Patient Stated Goal: to get better PT Goal Formulation: With patient Time For Goal Achievement: 03/09/21 Potential to Achieve Goals: Good Progress towards PT goals: Progressing toward goals    Frequency    Min 3X/week      PT Plan Current plan remains appropriate    Co-evaluation              AM-PAC PT "6 Clicks" Mobility   Outcome Measure  Help needed turning from your back to your side while in a flat bed without using bedrails?: A Little Help needed moving from lying on your back to sitting on the side of a flat bed without using bedrails?: A Lot Help needed moving to and from a bed to a chair (including a wheelchair)?: A Little Help needed standing up from a chair using your arms (e.g., wheelchair or bedside chair)?: A Little Help needed to walk in hospital room?: A Little Help needed climbing 3-5 steps with a railing? : A Lot 6 Click Score: 16    End of Session Equipment Utilized During Treatment: Gait belt Activity Tolerance: Patient tolerated treatment well Patient left: with call bell/phone within reach;in chair;with chair alarm set Nurse Communication: Mobility status PT Visit Diagnosis: Unsteadiness on feet (R26.81);Muscle weakness (generalized) (M62.81)     Time: 8413-2440 PT Time Calculation (min) (ACUTE ONLY): 34 min  Charges:  $Gait Training: 8-22 mins $Therapeutic Activity: 8-22 mins                     [name] P., PTA Acute Rehabilitation Services Pager: [phone] Office: [place] 02/26/2021, 5:24 PM

## 2021-02-26 NOTE — TOC Progression Note (Signed)
Transition of Care [facility]) - Progression Note    Patient Details  Name: Jesse [name] MRN: [id number] Date of Birth: 05-Jan-1941  Transition of Care [facility]) CM/SW [place], [place] Phone Number: 02/26/2021, 10:30 AM  Clinical Narrative:     CSW sent message to [facility]/SNF requesting to review clinicals for possible placement-waiting on response   Thurmond Butts, MSW, LCSW Clinical Social Worker   Expected Discharge Plan: [place] Barriers to Discharge: Continued Medical Work up  Expected Discharge Plan and Services Expected Discharge Plan: [place]   Discharge Planning Services: CM Consult Post Acute Care Choice: [place] Living arrangements for the past 2 months: Single Family Home Expected Discharge Date: 02/25/21                                     Social Determinants of Health (SDOH) Interventions    Readmission Risk Interventions No flowsheet data found.

## 2021-02-26 NOTE — Progress Notes (Signed)
Mobility Specialist: Progress Note   02/26/21 1831  Mobility  Activity Ambulated in hall  Level of Assistance Contact guard assist, steadying assist  Assistive Device Front wheel walker  Distance Ambulated (ft) 100 ft  Mobility Ambulated with assistance in hallway  Mobility Response Tolerated well  Mobility performed by Mobility specialist  Bed Position Chair  $Mobility charge 1 Mobility   Pre-Mobility: 73 HR, 99%  SpO2 Post-Mobility: 75 HR, 98% SpO2  Pt asx during ambulation. Pt was contact guard to stand as well as during ambulation. Pt back to chair with chair alarm on and call bell in reach.   [facility] Silvanna Ohmer Mobility Specialist Mobility Specialist Phone: [phone]

## 2021-02-26 NOTE — Progress Notes (Signed)
Patient ID: Jesse [name], male   DOB: December 01, 1940, 80 y.o.   MRN: [id number] S: Feels well, no complaints.  No diarrhea since yesterday. O:BP (!) 150/85 (BP Location: Right Arm)   Pulse 72   Temp 97.9 F (36.6 C) (Oral)   Resp 20   Ht 5\' 5"  (1.651 m)   Wt 73.2 kg   SpO2 96%   BMI 26.85 kg/m   Intake/Output Summary (Last 24 hours) at 02/26/2021 1414 Last data filed at 02/26/2021 0342 Gross per 24 hour  Intake --  Output 600 ml  Net -600 ml   Intake/Output: I/O last 3 completed shifts: In: -  Out: 600 [Urine:600]  Intake/Output this shift:  No intake/output data recorded. Weight change:  Gen: NAD CVS: RRR Resp: cta Abd: benign Ext: no edema  Recent Labs  Lab 02/19/21 1621 02/22/21 1744 02/22/21 1949 02/23/21 0103 02/23/21 0310 02/23/21 0428 02/23/21 1203 02/23/21 1431 02/23/21 1821 02/23/21 2015 02/23/21 2201 02/24/21 0022 02/24/21 0137 02/25/21 0557 02/26/21 0419  NA 132* 111* 112*   < > 119*   < > 119*   < > 116* 116* 118* 120* 121* 128* 131*  K 3.7 4.0 3.6  --  3.0*  --  3.2*  --   --   --   --   --  3.4* 3.9 3.9  CL 97* 81* 79*  --  85*  --  87*  --   --   --   --   --  92* 100 104  CO2 26 18* 20*  --  23  --  22  --   --   --   --   --  19* 20* 20*  GLUCOSE 139* 96 96  --  84  --  100*  --   --   --   --   --  85 85 101*  BUN 17 24* 23  --  19  --  19  --   --   --   --   --  18 15 19   CREATININE 1.15 1.08 1.04  --  0.90  --  0.79  --   --   --   --   --  0.54* 0.61 0.76  ALBUMIN 4.0  --   --   --  2.8*  --   --   --   --   --   --   --  2.5* 2.4* 2.5*  CALCIUM 9.8 8.7* 8.9  --  8.3*  --  8.6*  --   --   --   --   --  8.3* 8.5* 9.1  PHOS  --   --   --   --   --   --   --   --   --   --   --   --  1.4* 1.6* 2.8  AST 20  --   --   --  152*  --   --   --   --   --   --   --   --   --   --   ALT 20  --   --   --  70*  --   --   --   --   --   --   --   --   --   --    < > = values in this interval not displayed.   Liver Function Tests: Recent Labs  Lab  02/19/21 1621 02/23/21 0310 02/24/21 0137 02/25/21 0557 02/26/21 0419  AST 20 152*  --   --   --   ALT 20 70*  --   --   --   ALKPHOS 54 37*  --   --   --   BILITOT 0.9 2.9*  --   --   --   PROT 7.1 5.4*  --   --   --   ALBUMIN 4.0 2.8* 2.5* 2.4* 2.5*   Recent Labs  Lab 02/19/21 1621  LIPASE 28   No results for input(s): AMMONIA in the last 168 hours. CBC: Recent Labs  Lab 02/19/21 1621 02/22/21 1744 02/23/21 0310 02/25/21 0557  WBC 12.6* 18.2* 16.3* 13.3*  NEUTROABS  --  14.5*  --   --   HGB 15.2 14.2 14.0 14.1  HCT 47.2 42.7 40.2 42.5  MCV 86.1 83.9 80.2 84.2  PLT 307 151 285 335   Cardiac Enzymes: No results for input(s): CKTOTAL, CKMB, CKMBINDEX, TROPONINI in the last 168 hours. CBG: No results for input(s): GLUCAP in the last 168 hours.  Iron Studies: No results for input(s): IRON, TIBC, TRANSFERRIN, FERRITIN in the last 72 hours. Studies/Results: No results found. Marland Kitchen aspirin EC  81 mg Oral Daily  . Chlorhexidine Gluconate Cloth  6 each Topical Daily  . enoxaparin (LOVENOX) injection  40 mg Subcutaneous Q24H  . omega-3 acid ethyl esters  1 g Oral Daily  . simvastatin  20 mg Oral q1800  . vitamin E  1,000 Units Oral Daily    BMET    Component Value Date/Time   NA 131 (L) 02/26/2021 0419   NA 136 08/19/2017 1149   NA 135 (L) 10/21/2016 1151   K 3.9 02/26/2021 0419   K 3.8 08/19/2017 1149   K 3.9 10/21/2016 1151   CL 104 02/26/2021 0419   CL 95 (L) 08/19/2017 1149   CO2 20 (L) 02/26/2021 0419   CO2 28 08/19/2017 1149   CO2 25 10/21/2016 1151   GLUCOSE 101 (H) 02/26/2021 0419   GLUCOSE 114 08/19/2017 1149   BUN 19 02/26/2021 0419   BUN 14 08/19/2017 1149   BUN 13.6 10/21/2016 1151   CREATININE 0.76 02/26/2021 0419   CREATININE 0.85 01/26/2021 1119   CREATININE 1.0 08/19/2017 1149   CREATININE 0.8 10/21/2016 1151   CALCIUM 9.1 02/26/2021 0419   CALCIUM 10.1 08/19/2017 1149   CALCIUM 10.3 10/21/2016 1151   GFRNONAA >60 02/26/2021 0419    GFRNONAA >60 01/26/2021 1119   GFRAA >60 05/19/2020 1054   CBC    Component Value Date/Time   WBC 13.3 (H) 02/25/2021 0557   RBC 5.05 02/25/2021 0557   HGB 14.1 02/25/2021 0557   HGB 14.3 01/26/2021 1119   HGB 16.1 08/19/2017 1149   HGB 14.4 03/24/2008 0919   HCT 42.5 02/25/2021 0557   HCT 46.9 08/19/2017 1149   HCT 44.1 03/24/2008 0919   PLT 335 02/25/2021 0557   PLT 280 01/26/2021 1119   PLT 251 08/19/2017 1149   PLT 310 03/24/2008 0919   MCV 84.2 02/25/2021 0557   MCV 89 08/19/2017 1149   MCV 76.3 (L) 03/24/2008 0919   MCH 27.9 02/25/2021 0557   MCHC 33.2 02/25/2021 0557   RDW 16.3 (H) 02/25/2021 0557   RDW 14.9 08/19/2017 1149   RDW 19.1 (H) 03/24/2008 0919   LYMPHSABS 1.1 02/22/2021 1744   LYMPHSABS 2.2 08/19/2017 1149   LYMPHSABS 2.0 03/24/2008 0919   MONOABS 2.5 (H) 02/22/2021 1744  MONOABS 1.1 (H) 03/24/2008 0919   EOSABS 0.0 02/22/2021 1744   EOSABS 0.0 08/19/2017 1149   BASOSABS 0.0 02/22/2021 1744   BASOSABS 0.0 08/19/2017 1149   BASOSABS 0.0 03/24/2008 0919     Assessment/Plan:  1. Hyponatremia, acute - presumably hypovolemic due to diarrheal illness with concomitant HCTZ.  Improved with IVF's and no more diarrhea.  Do not resume HCTZ given recurrent hyponatremia. 2. Hypokalemia - due to poor po intake and diuretics.  Improved with repletion.  Mg WNL 3. HTN - stable 4. Diarrhea - improved. 5. Disposition - for discharge to SNF tomorrow.  Will sign off.  Nothing further to add and not outpatient follow up needed.  Call with any questions or concerns.   Donetta Potts, MD [employer] [phone]

## 2021-02-26 NOTE — Progress Notes (Signed)
PROGRESS NOTE    Jesse [name]  DHR:[id number] DOB: 1941/07/18 DOA: 02/22/2021 PCP: Wenda Low, MD   Brief Narrative:  Jesse [name] is a 80 y.o. male who presented to [facility] ED 02/23/21 after a fall at home.  He had apparently had progressive weakness to the point he fell and landed on bilateral knees.  He did not hit his head and did lose consciousness. In ED, he was found to have Na 112.  He did not have any additional neurological symptoms.  He had informed ED team that he restricts his salt intake due to hx of HTN. Of note, he is also on HCTZ at baseline. CT head was negative. Nephrology was consulted and recommended 3% NS.  Due to 3%, PCCM asked to see in consultation for ICU admission. Labs improved overnight - 3% NS off and patient back to [facility] service as of 02/24/21.  Sodium improving with fluid restriction and discontinuation of HCTZ.  Initial plan for discharge on 02/26/2019 to home with home health therapy.  After further discussion with family and patient he has now opted to discharge to SNF per previous recommendations, currently awaiting placement but otherwise medically stable for discharge.  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 3% NS off - fluid restrict and liberalize diet; holding HCTZ  Sodium returning to baseline  Mechanical Fall without syncope - presumed 2/2 above. - PT eval ongoing - recommending SNF versus home health PT, given patient lives alone SNF was agreed to upon by patient and family over the weekend  Hx HTN, HLD. - Hold home Prinzide, Zestoretic. - Continue home Simvastatin.  Hx Polycythemia vera - F/u as outpatient.   Depression Insomnia - DC home alprazolam (listed as PRN for sleep) - high risk medication at his age - no  usage here for >72h - No other antidepressants listed on home med list   DVT prophylaxis: Lovenox Code Status: Full Family Communication: None present  Status is: Inpatient  Dispo: The patient is from: Home              Anticipated d/c is to: SNF              Anticipated d/c date is: Imminent pending safe dispo              Patient currently IS medically stable for discharge  Consultants:   PCCM, nephrology  Procedures:   None  Antimicrobials:  None  Subjective: No acute issues or events overnight  Objective: Vitals:   02/25/21 2332 02/26/21 0340 02/26/21 0753 02/26/21 1311  BP: 136/81 137/87 138/78 (!) 150/85  Pulse: 74 71 71 72  Resp: 20 15 (!) 21 20  Temp: 98.2 F (36.8 C) 98.1 F (36.7 C) 97.6 F (36.4 C) 97.9 F (36.6 C)  TempSrc: Oral Oral Oral Oral  SpO2: 97% 96% 98% 96%  Weight:      Height:        Intake/Output Summary (Last 24 hours) at 02/26/2021 1401 Last data filed at 02/26/2021 0342 Gross per 24 hour  Intake --  Output 600 ml  Net -600 ml   Filed Weights   02/22/21 1759 02/23/21 0213 02/23/21 1856  Weight: 72.1 kg 70.8 kg 73.2 kg    Examination:  General  exam: Appears calm and comfortable  Respiratory system: Clear to auscultation. Respiratory effort normal. Cardiovascular system: S1 & S2 heard, RRR. No JVD, murmurs, rubs, gallops or clicks. No pedal edema. Gastrointestinal system: Abdomen is nondistended, soft and nontender. No organomegaly or masses felt. Normal bowel sounds heard. Central nervous system: Alert and oriented. No focal neurological deficits. Extremities: Symmetric 5 x 5 power. Skin: No rashes, lesions or ulcers Psychiatry: Judgement and insight appear normal. Mood & affect appropriate.     Data Reviewed: I have personally reviewed following labs and imaging studies  CBC: Recent Labs  Lab 02/19/21 1621 02/22/21 1744 02/23/21 0310 02/25/21 0557  WBC 12.6* 18.2* 16.3* 13.3*  NEUTROABS  --  14.5*  --   --   HGB  15.2 14.2 14.0 14.1  HCT 47.2 42.7 40.2 42.5  MCV 86.1 83.9 80.2 84.2  PLT 307 151 285 951   Basic Metabolic Panel: Recent Labs  Lab 02/23/21 0310 02/23/21 0428 02/23/21 0950 02/23/21 1203 02/23/21 1431 02/23/21 2201 02/24/21 0022 02/24/21 0137 02/25/21 0557 02/26/21 0419  NA 119*   < > 121* 119*   < > 118* 120* 121* 128* 131*  K 3.0*  --   --  3.2*  --   --   --  3.4* 3.9 3.9  CL 85*  --   --  87*  --   --   --  92* 100 104  CO2 23  --   --  22  --   --   --  19* 20* 20*  GLUCOSE 84  --   --  100*  --   --   --  85 85 101*  BUN 19  --   --  19  --   --   --  18 15 19   CREATININE 0.90  --   --  0.79  --   --   --  0.54* 0.61 0.76  CALCIUM 8.3*  --   --  8.6*  --   --   --  8.3* 8.5* 9.1  MG  --   --  1.8  --   --   --   --   --   --   --   PHOS  --   --   --   --   --   --   --  1.4* 1.6* 2.8   < > = values in this interval not displayed.   GFR: Estimated Creatinine Clearance: 66.2 mL/min (by C-G formula based on SCr of 0.76 mg/dL). Liver Function Tests: Recent Labs  Lab 02/19/21 1621 02/23/21 0310 02/24/21 0137 02/25/21 0557 02/26/21 0419  AST 20 152*  --   --   --   ALT 20 70*  --   --   --   ALKPHOS 54 37*  --   --   --   BILITOT 0.9 2.9*  --   --   --   PROT 7.1 5.4*  --   --   --   ALBUMIN 4.0 2.8* 2.5* 2.4* 2.5*   Recent Labs  Lab 02/19/21 1621  LIPASE 28   No results for input(s): AMMONIA in the last 168 hours. Coagulation Profile: No results for input(s): INR, PROTIME in the last 168 hours. Cardiac Enzymes: No results for input(s): CKTOTAL, CKMB, CKMBINDEX, TROPONINI in the last 168 hours. BNP (last 3 results) No results for input(s): PROBNP in the last 8760 hours. HbA1C: No results for input(s): HGBA1C in the last  72 hours. CBG: No results for input(s): GLUCAP in the last 168 hours. Lipid Profile: No results for input(s): CHOL, HDL, LDLCALC, TRIG, CHOLHDL, LDLDIRECT in the last 72 hours. Thyroid Function Tests: No results for input(s): TSH,  T4TOTAL, FREET4, T3FREE, THYROIDAB in the last 72 hours. Anemia Panel: No results for input(s): VITAMINB12, FOLATE, FERRITIN, TIBC, IRON, RETICCTPCT in the last 72 hours. Sepsis Labs: No results for input(s): PROCALCITON, LATICACIDVEN in the last 168 hours.  Recent Results (from the past 240 hour(s))  Resp Panel by RT-PCR (Flu A&B, Covid) Nasopharyngeal Swab     Status: None   Collection Time: 02/22/21  9:18 PM   Specimen: Nasopharyngeal Swab; Nasopharyngeal(NP) swabs in vial transport medium  Result Value Ref Range Status   SARS Coronavirus 2 by RT PCR NEGATIVE NEGATIVE Final    Comment: (NOTE) SARS-CoV-2 target nucleic acids are NOT DETECTED.  The SARS-CoV-2 RNA is generally detectable in upper respiratory specimens during the acute phase of infection. The lowest concentration of SARS-CoV-2 viral copies this assay can detect is 138 copies/mL. A negative result does not preclude SARS-Cov-2 infection and should not be used as the sole basis for treatment or other patient management decisions. A negative result may occur with  improper specimen collection/handling, submission of specimen other than nasopharyngeal swab, presence of viral mutation(s) within the areas targeted by this assay, and inadequate number of viral copies(<138 copies/mL). A negative result must be combined with clinical observations, patient history, and epidemiological information. The expected result is Negative.  Fact Sheet for Patients:  EntrepreneurPulse.com.au  Fact Sheet for Healthcare Providers:  IncredibleEmployment.be  This test is no t yet approved or cleared by the [place] FDA and  has been authorized for detection and/or diagnosis of SARS-CoV-2 by FDA under an Emergency Use Authorization (EUA). This EUA will remain  in effect (meaning this test can be used) for the duration of the COVID-19 declaration under Section 564(b)(1) of the Act, 21 U.S.C.section  360bbb-3(b)(1), unless the authorization is terminated  or revoked sooner.       Influenza A by PCR NEGATIVE NEGATIVE Final   Influenza B by PCR NEGATIVE NEGATIVE Final    Comment: (NOTE) The Xpert Xpress SARS-CoV-2/FLU/RSV plus assay is intended as an aid in the diagnosis of influenza from Nasopharyngeal swab specimens and should not be used as a sole basis for treatment. Nasal washings and aspirates are unacceptable for Xpert Xpress SARS-CoV-2/FLU/RSV testing.  Fact Sheet for Patients: EntrepreneurPulse.com.au  Fact Sheet for Healthcare Providers: IncredibleEmployment.be  This test is not yet approved or cleared by the [place] FDA and has been authorized for detection and/or diagnosis of SARS-CoV-2 by FDA under an Emergency Use Authorization (EUA). This EUA will remain in effect (meaning this test can be used) for the duration of the COVID-19 declaration under Section 564(b)(1) of the Act, 21 U.S.C. section 360bbb-3(b)(1), unless the authorization is terminated or revoked.  Performed at [facility], [place] [address], [place], [place] [address]   MRSA PCR Screening     Status: None   Collection Time: 02/23/21  2:24 AM   Specimen: Nasal Mucosa; Nasopharyngeal  Result Value Ref Range Status   MRSA by PCR NEGATIVE NEGATIVE Final    Comment:        The GeneXpert MRSA Assay (FDA approved for NASAL specimens only), is one component of a comprehensive MRSA colonization surveillance program. It is not intended to diagnose MRSA infection nor to guide or monitor treatment for MRSA infections. Performed at  [facility], [place] [address], [place], [place] [address]   Gastrointestinal Panel by PCR , Stool     Status: None   Collection Time: 02/23/21  3:57 PM   Specimen: Stool  Result Value Ref Range Status   Campylobacter species NOT DETECTED NOT DETECTED Final   Plesimonas shigelloides NOT DETECTED NOT DETECTED Final    Salmonella species NOT DETECTED NOT DETECTED Final   Yersinia enterocolitica NOT DETECTED NOT DETECTED Final   Vibrio species NOT DETECTED NOT DETECTED Final   Vibrio cholerae NOT DETECTED NOT DETECTED Final   Enteroaggregative E coli (EAEC) NOT DETECTED NOT DETECTED Final   Enteropathogenic E coli (EPEC) NOT DETECTED NOT DETECTED Final   Enterotoxigenic E coli (ETEC) NOT DETECTED NOT DETECTED Final   Shiga like toxin producing E coli (STEC) NOT DETECTED NOT DETECTED Final   Shigella/Enteroinvasive E coli (EIEC) NOT DETECTED NOT DETECTED Final   Cryptosporidium NOT DETECTED NOT DETECTED Final   Cyclospora cayetanensis NOT DETECTED NOT DETECTED Final   Entamoeba histolytica NOT DETECTED NOT DETECTED Final   Giardia lamblia NOT DETECTED NOT DETECTED Final   Adenovirus F40/41 NOT DETECTED NOT DETECTED Final   Astrovirus NOT DETECTED NOT DETECTED Final   Norovirus GI/GII NOT DETECTED NOT DETECTED Final   Rotavirus A NOT DETECTED NOT DETECTED Final   Sapovirus (I, II, IV, and V) NOT DETECTED NOT DETECTED Final    Comment: Performed at [facility], [address], [place],  [address]         Radiology Studies: No results found.      Scheduled Meds: . aspirin EC  81 mg Oral Daily  . Chlorhexidine Gluconate Cloth  6 each Topical Daily  . enoxaparin (LOVENOX) injection  40 mg Subcutaneous Q24H  . omega-3 acid ethyl esters  1 g Oral Daily  . simvastatin  20 mg Oral q1800  . vitamin E  1,000 Units Oral Daily   Continuous Infusions:    LOS: 4 days   Time spent: 24min  Kinslee Dalpe C Savion Washam, DO [facility]  If 7PM-7AM, please contact night-coverage [email or web address]  02/26/2021, 2:01 PM

## 2021-02-27 DIAGNOSIS — I10 Essential (primary) hypertension: Secondary | ICD-10-CM | POA: Diagnosis not present

## 2021-02-27 DIAGNOSIS — Z743 Need for continuous supervision: Secondary | ICD-10-CM | POA: Diagnosis not present

## 2021-02-27 DIAGNOSIS — M6281 Muscle weakness (generalized): Secondary | ICD-10-CM | POA: Diagnosis not present

## 2021-02-27 DIAGNOSIS — K5901 Slow transit constipation: Secondary | ICD-10-CM | POA: Diagnosis not present

## 2021-02-27 DIAGNOSIS — N135 Crossing vessel and stricture of ureter without hydronephrosis: Secondary | ICD-10-CM | POA: Diagnosis not present

## 2021-02-27 DIAGNOSIS — R7401 Elevation of levels of liver transaminase levels: Secondary | ICD-10-CM | POA: Diagnosis not present

## 2021-02-27 DIAGNOSIS — E441 Mild protein-calorie malnutrition: Secondary | ICD-10-CM | POA: Diagnosis not present

## 2021-02-27 DIAGNOSIS — J069 Acute upper respiratory infection, unspecified: Secondary | ICD-10-CM | POA: Diagnosis not present

## 2021-02-27 DIAGNOSIS — Y92009 Unspecified place in unspecified non-institutional (private) residence as the place of occurrence of the external cause: Secondary | ICD-10-CM | POA: Diagnosis not present

## 2021-02-27 DIAGNOSIS — R531 Weakness: Secondary | ICD-10-CM | POA: Diagnosis not present

## 2021-02-27 DIAGNOSIS — R279 Unspecified lack of coordination: Secondary | ICD-10-CM | POA: Diagnosis not present

## 2021-02-27 DIAGNOSIS — R1312 Dysphagia, oropharyngeal phase: Secondary | ICD-10-CM | POA: Diagnosis not present

## 2021-02-27 DIAGNOSIS — R2681 Unsteadiness on feet: Secondary | ICD-10-CM | POA: Diagnosis not present

## 2021-02-27 DIAGNOSIS — E871 Hypo-osmolality and hyponatremia: Secondary | ICD-10-CM | POA: Diagnosis not present

## 2021-02-27 DIAGNOSIS — W19XXXD Unspecified fall, subsequent encounter: Secondary | ICD-10-CM | POA: Diagnosis not present

## 2021-02-27 DIAGNOSIS — M47812 Spondylosis without myelopathy or radiculopathy, cervical region: Secondary | ICD-10-CM | POA: Diagnosis not present

## 2021-02-27 DIAGNOSIS — E785 Hyperlipidemia, unspecified: Secondary | ICD-10-CM | POA: Diagnosis not present

## 2021-02-27 LAB — RENAL FUNCTION PANEL
Albumin: 2.6 g/dL — ABNORMAL LOW (ref 3.5–5.0)
Anion gap: 10 (ref 5–15)
BUN: 25 mg/dL — ABNORMAL HIGH (ref 8–23)
CO2: 19 mmol/L — ABNORMAL LOW (ref 22–32)
Calcium: 9.2 mg/dL (ref 8.9–10.3)
Chloride: 100 mmol/L (ref 98–111)
Creatinine, Ser: 0.74 mg/dL (ref 0.61–1.24)
GFR, Estimated: >60 mL/min (ref >60)
Glucose, Bld: 97 mg/dL (ref 70–99)
Phosphorus: 3.5 mg/dL (ref 2.5–4.6)
Potassium: 3.5 mmol/L (ref 3.5–5.1)
Sodium: 129 mmol/L — ABNORMAL LOW (ref 135–145)

## 2021-02-27 NOTE — TOC Transition Note (Signed)
Transition of Care [facility]) - CM/SW Discharge Note   Patient Details  Name: WALTER GRIMA MRN: [id number] Date of Birth: [birth year]  Transition of Care [facility]) CM/SW Contact:  Vinie Sill, LCSW Phone Number: 02/27/2021, 11:59 AM   Clinical Narrative:     Patient will Discharge to: [facility]  Discharge Date: 02/27/2021 Family Notified: [name]-left voice message  Transport By: Corey Harold  Per MD patient is ready for discharge. RN, patient, and facility notified of discharge. Discharge Summary sent to facility. RN given number for [phone], Room 222-A. Ambulance transport requested for patient.   Clinical Social Worker signing off.  Thurmond Butts, MSW, LCSW Clinical Social Worker    Final next level of care: Skilled Nursing Facility Barriers to Discharge: Continued Medical Work up   Patient Goals and CMS Choice Patient states their goals for this hospitalization and ongoing recovery are:: To return home      Discharge Placement                       Discharge Plan and Services   Discharge Planning Services: CM Consult Post Acute Care Choice: [place]                               Social Determinants of Health (SDOH) Interventions     Readmission Risk Interventions No flowsheet data found.

## 2021-02-27 NOTE — Progress Notes (Signed)
Pt being d/c. Vss, education provided, going to [place] via ptar. IV removed.  Chrisandra Carota, RN 02/27/2021 1:08 PM

## 2021-02-27 NOTE — Progress Notes (Signed)
Mobility Specialist: Progress Note   02/27/21 1355  Mobility  Activity Ambulated in hall  Level of Assistance Contact guard assist, steadying assist  Assistive Device Front wheel walker  Distance Ambulated (ft) 60 ft  Mobility Ambulated with assistance in hallway  Mobility Response Tolerated well  Mobility performed by Mobility specialist  $Mobility charge 1 Mobility   Pt distance limited due to needing to void. Pt back to room to void and sitting EOB once finished. Pt has call bell at his side.   [facility] Edenilson Austad Mobility Specialist Mobility Specialist Phone: [phone]

## 2021-02-28 ENCOUNTER — Encounter: Payer: Self-pay | Admitting: Adult Health

## 2021-02-28 ENCOUNTER — Non-Acute Institutional Stay (SKILLED_NURSING_FACILITY): Payer: Medicare Other | Admitting: Adult Health

## 2021-02-28 DIAGNOSIS — I10 Essential (primary) hypertension: Secondary | ICD-10-CM

## 2021-02-28 DIAGNOSIS — K5901 Slow transit constipation: Secondary | ICD-10-CM | POA: Diagnosis not present

## 2021-02-28 DIAGNOSIS — E785 Hyperlipidemia, unspecified: Secondary | ICD-10-CM | POA: Diagnosis not present

## 2021-02-28 DIAGNOSIS — E871 Hypo-osmolality and hyponatremia: Secondary | ICD-10-CM | POA: Diagnosis not present

## 2021-02-28 NOTE — Progress Notes (Signed)
Location:  [facility] Room Number: 222-A Place of Service:  SNF (31) Provider:  Durenda Age, DNP, FNP-BC  Patient Care Team: Wenda Low, MD as PCP - General (Internal Medicine)  Extended Emergency Contact Information Primary Emergency Contact: [facility] Address: [address]          [place], [place] [address] Johnnette Litter of [place] Phone: [phone] Relation: Sister  Code Status:  FULL CODE  Goals of care: Advanced Directive information Advanced Directives 03/02/2021  Does Patient Have a Medical Advance Directive? No  Type of Advance Directive -  Does patient want to make changes to medical advance directive? -  Copy of [place] in Chart? -  Would patient like information on creating a medical advance directive? No - Patient declined  Pre-existing out of facility DNR order (yellow form or pink MOST form) -     Chief Complaint  Patient presents with   Hospitalization Follow-up    Follow up from Hospital.     HPI:  Pt is a 80 y.o. male who was admitted to [facility] on 02/27/21 post hospital admission 02/22/21 to 02/27/21 S/P fall and landed on bilateral knees..  He denies loss of consciousness nor hitting his head.  In the ED he was found to have sodium 112.Marland Kitchen  He did not have any neurological symptoms.  He had sodium of 112 he stated that he restricts his salt intake due to history of hypertension.  He is on HCTZ at baseline.  CT head was negative.  Nephrology was consulted and recommended IVF of 3% NS.  Diarrhea resolved after discontinuation of HCTZ.Marland Kitchen  He was seen in his room today.  He complained of constipation x4 days.   Past Medical History:  Diagnosis Date   Depression    Hypercholesteremia    Hypertension    HZ (herpes zoster) 09/02/2011   Neuritis    left foot   Polycythemia rubra vera ([place]) 09/02/2011   Past Surgical History:  Procedure Laterality Date   GAS  INSERTION  06/04/2012   Procedure: INSERTION OF GAS;  Surgeon: Hayden Pedro, MD;  Location: [facility];  Service: Ophthalmology;  Laterality: Left;  [id number]   SCLERAL BUCKLE  06/04/2012   Procedure: SCLERAL BUCKLE;  Surgeon: Hayden Pedro, MD;  Location: [facility];  Service: Ophthalmology;  Laterality: Left;  Headscope laser    Allergies  Allergen Reactions   Tramadol Nausea And Vomiting    Outpatient Encounter Medications as of 02/28/2021  Medication Sig   aspirin EC 81 MG tablet Take 81 mg by mouth daily.   bisacodyl (DULCOLAX) 10 MG suppository Place 10 mg rectally as needed for moderate constipation.   fish oil-omega-3 fatty acids 1000 MG capsule Take 1 g by mouth daily.   lisinopril (ZESTRIL) 20 MG tablet Take 1 tablet (20 mg total) by mouth daily.   Magnesium Hydroxide (MILK OF MAGNESIA PO) Take 30 mLs by mouth as needed.   ondansetron (ZOFRAN ODT) 4 MG disintegrating tablet Take 1 tablet (4 mg total) by mouth every 8 (eight) hours as needed for up to 15 doses for nausea or vomiting.   simvastatin (ZOCOR) 20 MG tablet Take 20 mg by mouth daily at 6 PM.    Sodium Phosphates (RA SALINE ENEMA RE) Place rectally as needed.   vitamin E 1000 UNIT capsule Take 1,000 Units by mouth daily.   [DISCONTINUED] loperamide (IMODIUM) 2 MG capsule Take 1 capsule (2 mg total) by mouth  as needed for diarrhea or loose stools.   [DISCONTINUED] COVID-19 mRNA Vac-TriS, Pfizer, SUSP injection USE AS DIRECTED   No facility-administered encounter medications on file as of 02/28/2021.    Review of Systems  GENERAL: No change in appetite, no fatigue, no weight changes, no fever, chills or weakness MOUTH and THROAT: Denies oral discomfort, gingival pain or bleeding, pain from teeth or hoarseness   RESPIRATORY: no cough, SOB, DOE, wheezing, hemoptysis CARDIAC: No chest pain, edema or palpitations GI: + Constipation GU: Denies dysuria, frequency, hematuria, incontinence, or discharge NEUROLOGICAL: Denies dizziness,  syncope, numbness, or headache PSYCHIATRIC: Denies feelings of depression or anxiety. No report of hallucinations, insomnia, paranoia, or agitation   Immunization History  Administered Date(s) Administered   Influenza Split 06/20/2014, 08/04/2018   Influenza, High Dose Seasonal PF 06/04/2019, 07/20/2020   Influenza,inj,Quad PF,6+ Mos 06/20/2014, 05/25/2015, 06/17/2016, 07/22/2017, 08/05/2018, 07/16/2019, 07/19/2020   PFIZER Comirnaty(Gray Top)Covid-19 Tri-Sucrose Vaccine 11/29/2020   PFIZER(Purple Top)SARS-COV-2 Vaccination 11/09/2019, 12/01/2019, 11/29/2020   Pneumococcal Conjugate-13 11/28/2011   Pneumococcal Polysaccharide-23 08/17/2018   Tdap 06/28/2013   Zoster Recombinat (Shingrix) 02/11/2019   Zoster, Live 08/17/2018, 02/11/2019   Pertinent  Health Maintenance Due  Topic Date Due   INFLUENZA VACCINE  04/23/2021   PNA vac Low Risk Adult  Completed   Fall Risk  10/21/2016 06/17/2016 04/08/2016 02/06/2016 12/04/2015  Falls in the past year? No No No No No     Vitals:   02/28/21 1105  BP: (!) 150/81  Pulse: 77  Resp: 17  Temp: 97.7 F (36.5 C)  Weight: 162 lb 3.2 oz (73.6 kg)  Height: 5\' 5"  (1.651 m)   Body mass index is 26.99 kg/m.  Physical Exam  GENERAL APPEARANCE: Well nourished. In no acute distress. Normal body habitus SKIN:  Skin is warm and dry.  MOUTH and THROAT: Lips are without lesions. Oral mucosa is moist and without lesions. Tongue is normal in shape, size, and color and without lesions RESPIRATORY: Breathing is even & unlabored, BS CTAB CARDIAC: RRR, no murmur,no extra heart sounds, BLE 2+ edema GI: Abdomen soft, normal BS, no masses, no tenderness NEUROLOGICAL: There is no tremor. Speech is clear. Alert and oriented X 3. PSYCHIATRIC:  Affect and behavior are appropriate  Labs reviewed: Recent Labs    02/23/21 0950 02/23/21 1203 02/25/21 0557 02/26/21 0419 02/27/21 0149  NA 121*   < > 128* 131* 129*  K  --    < > 3.9 3.9 3.5  CL  --    < > 100  104 100  CO2  --    < > 20* 20* 19*  GLUCOSE  --    < > 85 101* 97  BUN  --    < > 15 19 25*  CREATININE  --    < > 0.61 0.76 0.74  CALCIUM  --    < > 8.5* 9.1 9.2  MG 1.8  --   --   --   --   PHOS  --    < > 1.6* 2.8 3.5   < > = values in this interval not displayed.   Recent Labs    01/26/21 1119 02/19/21 1621 02/23/21 0310 02/24/21 0137 02/25/21 0557 02/26/21 0419 02/27/21 0149  AST 15 20 152*  --   --   --   --   ALT 15 20 70*  --   --   --   --   ALKPHOS 53 54 37*  --   --   --   --  BILITOT 1.0 0.9 2.9*  --   --   --   --   PROT 6.6 7.1 5.4*  --   --   --   --   ALBUMIN 4.1 4.0 2.8*   < > 2.4* 2.5* 2.6*   < > = values in this interval not displayed.   Recent Labs    12/03/20 1743 01/26/21 1119 02/19/21 1621 02/22/21 1744 02/23/21 0310 02/25/21 0557  WBC THIS TEST WAS ORDERED IN ERROR AND HAS BEEN CREDITED.  8.3 6.7   < > 18.2* 16.3* 13.3*  NEUTROABS 4.9 3.9  --  14.5*  --   --   HGB 14.0 14.3   < > 14.2 14.0 14.1  HCT 44.0 44.6   < > 42.7 40.2 42.5  MCV 86.6 85.6   < > 83.9 80.2 84.2  PLT 351 280   < > 151 285 335   < > = values in this interval not displayed.   Lab Results  Component Value Date   TSH 0.738 02/23/2021   No results found for: HGBA1C No results found for: CHOL, HDL, LDLCALC, LDLDIRECT, TRIG, CHOLHDL  Significant Diagnostic Results in last 30 days:  CT ABDOMEN PELVIS WO CONTRAST  Result Date: 02/19/2021 CLINICAL DATA:  Left lower quadrant pain for several hours, initial encounter EXAM: CT ABDOMEN AND PELVIS WITHOUT CONTRAST TECHNIQUE: Multidetector CT imaging of the abdomen and pelvis was performed following the standard protocol without IV contrast. COMPARISON:  12/03/2020 FINDINGS: Lower chest: Lung bases are free of acute infiltrate or sizable effusion. Hepatobiliary: No focal liver abnormality is seen. No gallstones, gallbladder wall thickening, or biliary dilatation. Pancreas: Unremarkable. No pancreatic ductal dilatation or surrounding  inflammatory changes. Spleen: Normal in size without focal abnormality. Adrenals/Urinary Tract: Adrenal glands are within normal limits. Right kidney demonstrates some mild perinephric stranding stable from the prior study. No obstructive changes are seen. The left kidney demonstrates some cortical cysts stable from the prior exam. Significant perinephric stranding is noted similar to that seen on the prior exam. No calculi are noted. The collecting system is prominent with a prominent extrarenal pelvis identified. This was reported as prominent cyst in the renal sinus on prior exam however a given the appearance this is felt to represent dilated renal pelvis. These changes have the appearance of a chronic UPJ obstruction. The ureter is well visualized and within normal limits. No calculi are seen. The bladder is well distended. Stomach/Bowel: Mild diverticular change of the colon is noted. No obstructive or inflammatory changes of the colon are noted. The appendix is well visualized and within normal limits. Small bowel and stomach appear unremarkable. Vascular/Lymphatic: Aortic atherosclerosis. No enlarged abdominal or pelvic lymph nodes. Reproductive: Prostate is unremarkable. Other: No abdominal wall hernia or abnormality. No abdominopelvic ascites. Musculoskeletal: Degenerative changes of the lumbar spine are noted. IMPRESSION: Findings in the left kidney most consistent with a chronic UPJ obstruction. The overall appearance is similar to that seen on the prior exam. This has been previously reported as a large renal pelvic cyst although the collecting system inter communicates with this consistent with hydronephrosis. Distal ureter on the left is within normal limits. Diverticulosis without diverticulitis. No other focal abnormality is noted. Electronically Signed   By: Inez Catalina M.D.   On: 02/19/2021 16:58   DG Chest 2 View  Result Date: 02/22/2021 CLINICAL DATA:  Fall, altered mental status EXAM: CHEST  - 2 VIEW COMPARISON:  10/15/2014 FINDINGS: Heart and mediastinal contours are within normal limits.  No focal opacities or effusions. No acute bony abnormality. Aortic atherosclerosis. IMPRESSION: No active cardiopulmonary disease. Electronically Signed   By: Rolm Baptise M.D.   On: 02/22/2021 18:41   CT Head Wo Contrast  Result Date: 02/22/2021 CLINICAL DATA:  Head and neck trauma, fell EXAM: CT HEAD WITHOUT CONTRAST TECHNIQUE: Contiguous axial images were obtained from the base of the skull through the vertex without intravenous contrast. COMPARISON:  None. FINDINGS: Brain: No acute infarct or hemorrhage. Focal hypodensities within the periventricular white matter and bilateral basal ganglia most consistent with chronic small vessel ischemic change. Lateral ventricles and remaining midline structures are unremarkable. No acute extra-axial fluid collections. No mass effect. Vascular: No hyperdense vessel or unexpected calcification. Skull: Normal. Negative for fracture or focal lesion. Sinuses/Orbits: No acute finding.  Postsurgical changes left globe. Other: None. IMPRESSION: 1. No acute intracranial process. Electronically Signed   By: Randa Ngo M.D.   On: 02/22/2021 21:07   CT Cervical Spine Wo Contrast  Result Date: 02/22/2021 CLINICAL DATA:  Head and neck trauma, fell EXAM: CT CERVICAL SPINE WITHOUT CONTRAST TECHNIQUE: Multidetector CT imaging of the cervical spine was performed without intravenous contrast. Multiplanar CT image reconstructions were also generated. COMPARISON:  None. FINDINGS: Alignment: Alignment is anatomic. Skull base and vertebrae: No acute fracture. No primary bone lesion or focal pathologic process. Soft tissues and spinal canal: No prevertebral fluid or swelling. No visible canal hematoma. Disc levels: There is diffuse cervical spondylosis with prominent disc space narrowing and osteophyte formation from C3-4 through C6-7. Bilateral neural foraminal encroachment is seen at  these levels. Hypertrophic changes at the C1-2 interface. Upper chest: Airway is patent. Likely diverticulum off the right posterolateral aspect of the trachea at the level of the thoracic inlet. Lung apices are clear. Other: Reconstructed images demonstrate no additional findings. IMPRESSION: 1. No acute cervical spine fracture. 2. Significant multilevel cervical spondylosis from C3-4 through C6-7, with associated neural foraminal encroachment. Electronically Signed   By: Randa Ngo M.D.   On: 02/22/2021 21:09   DG Knee Complete 4 Views Left  Result Date: 02/22/2021 CLINICAL DATA:  Fall with knee pain. EXAM: LEFT KNEE - COMPLETE 4+ VIEW COMPARISON:  None. FINDINGS: No evidence for an acute fracture. No subluxation or dislocation. No joint effusion. Calcification of the lateral meniscus noted. No worrisome lytic or sclerotic osseous abnormality. IMPRESSION: 1. No acute bony abnormality. 2. Chondrocalcinosis in the lateral meniscus. This can be associated with CPPD. Electronically Signed   By: Misty Stanley M.D.   On: 02/22/2021 18:40   DG Knee Complete 4 Views Right  Result Date: 02/22/2021 CLINICAL DATA:  Fall, knee pain EXAM: RIGHT KNEE - COMPLETE 4+ VIEW COMPARISON:  None. FINDINGS: Chondrocalcinosis. Joint spaces maintained. No acute bony abnormality. Specifically, no fracture, subluxation, or dislocation. No joint effusion. IMPRESSION: No acute bony abnormality. Electronically Signed   By: Rolm Baptise M.D.   On: 02/22/2021 18:41     Assessment/Plan  1. Hyponatremia BMP Latest Ref Rng & Units 02/27/2021 02/26/2021 02/25/2021  Glucose 70 - 99 mg/dL 97 101(H) 85  BUN 8 - 23 mg/dL 25(H) 19 15  Creatinine 0.61 - 1.24 mg/dL 0.74 0.76 0.61  Sodium 135 - 145 mmol/L 129(L) 131(L) 128(L)  Potassium 3.5 - 5.1 mmol/L 3.5 3.9 3.9  Chloride 98 - 111 mmol/L 100 104 100  CO2 22 - 32 mmol/L 19(L) 20(L) 20(L)  Calcium 8.9 - 10.3 mg/dL 9.2 9.1 8.5(L)    -  HCTZ was discontinued  2. Essential  hypertension -Continue lisinopril  3. Hyperlipidemia, unspecified hyperlipidemia type -Continue simvastatin  4. Slow transit constipation -Start MiraLAX 17 g +6 ounces liquid daily and senna S2 tabs twice a day x3 days    Family/ staff Communication: Discussed plan of care with resident and charge nurse.  Labs/tests ordered: Urinalysis with culture and sensitivity  Goals of care:   Short-term care   Durenda Age, DNP, MSN, FNP-BC [facility] [phone] (Monday-Friday 8:00 a.m. - 5:00 p.m.) [phone] (after hours)

## 2021-03-02 ENCOUNTER — Non-Acute Institutional Stay (SKILLED_NURSING_FACILITY): Payer: Medicare Other | Admitting: Adult Health

## 2021-03-02 ENCOUNTER — Encounter: Payer: Self-pay | Admitting: Internal Medicine

## 2021-03-02 ENCOUNTER — Encounter: Payer: Self-pay | Admitting: Adult Health

## 2021-03-02 DIAGNOSIS — N135 Crossing vessel and stricture of ureter without hydronephrosis: Secondary | ICD-10-CM | POA: Insufficient documentation

## 2021-03-02 DIAGNOSIS — R7401 Elevation of levels of liver transaminase levels: Secondary | ICD-10-CM | POA: Insufficient documentation

## 2021-03-02 DIAGNOSIS — E785 Hyperlipidemia, unspecified: Secondary | ICD-10-CM | POA: Diagnosis not present

## 2021-03-02 DIAGNOSIS — I10 Essential (primary) hypertension: Secondary | ICD-10-CM | POA: Diagnosis not present

## 2021-03-02 DIAGNOSIS — K5790 Diverticulosis of intestine, part unspecified, without perforation or abscess without bleeding: Secondary | ICD-10-CM | POA: Insufficient documentation

## 2021-03-02 DIAGNOSIS — K5901 Slow transit constipation: Secondary | ICD-10-CM

## 2021-03-02 DIAGNOSIS — E46 Unspecified protein-calorie malnutrition: Secondary | ICD-10-CM | POA: Insufficient documentation

## 2021-03-02 DIAGNOSIS — E871 Hypo-osmolality and hyponatremia: Secondary | ICD-10-CM

## 2021-03-02 DIAGNOSIS — M47812 Spondylosis without myelopathy or radiculopathy, cervical region: Secondary | ICD-10-CM | POA: Insufficient documentation

## 2021-03-02 NOTE — Progress Notes (Addendum)
Location:  [facility] Room Number: 222-A Place of Service:  SNF (31) Provider:  Durenda Age, DNP, FNP-BC  Patient Care Team: Wenda Low, MD as PCP - General (Internal Medicine)  Extended Emergency Contact Information Primary Emergency Contact: [facility] Address: [address]          [place], [place] [address] Johnnette Litter of [place] Phone: [phone] Relation: Sister  Code Status:  FULL CODE  Goals of care: Advanced Directive information Advanced Directives 03/02/2021  Does Patient Have a Medical Advance Directive? No  Type of Advance Directive -  Does patient want to make changes to medical advance directive? -  Copy of [place] in Chart? -  Would patient like information on creating a medical advance directive? No - Patient declined  Pre-existing out of facility DNR order (yellow form or pink MOST form) -     Chief Complaint  Patient presents with   Discharge Note    Discharge from SNF    HPI:  Pt is a 80 y.o. [name] who is for discharge home on 03/05/21. He will discharge to his sister's home and will have Home health PT and OT.  He was admitted to [facility] on 02/27/21 S/P fall and landed on his bilateral knees.  He denies loss of consciousness nor hitting his head.  In the ED he was found to have sodium 112.  He did not have any neurological symptoms.  He stated that he restricts his salt intake due to history of hypertension.  He is on HCTZ at baseline.  CT head was negative.  Nephrology was consulted and recommended IVF of 3% NS.  Diarrhea resolved after discontinuation of HCTZ.  Patient was admitted to this facility for short-term rehabilitation after the patient's recent hospitalization.  Patient has completed SNF rehabilitation and therapy has cleared the patient for discharge.    Past Medical History:  Diagnosis Date   Depression    Hypercholesteremia    Hypertension     HZ (herpes zoster) 09/02/2011   Neuritis    left foot   Polycythemia rubra vera ([place]) 09/02/2011   Past Surgical History:  Procedure Laterality Date   GAS INSERTION  06/04/2012   Procedure: INSERTION OF GAS;  Surgeon: Hayden Pedro, MD;  Location: [facility];  Service: Ophthalmology;  Laterality: Left;  [id number]   SCLERAL BUCKLE  06/04/2012   Procedure: SCLERAL BUCKLE;  Surgeon: Hayden Pedro, MD;  Location: [facility];  Service: Ophthalmology;  Laterality: Left;  Headscope laser    Allergies  Allergen Reactions   Tramadol Nausea And Vomiting    Outpatient Encounter Medications as of 03/02/2021  Medication Sig   aspirin EC 81 MG tablet Take 81 mg by mouth daily.   bisacodyl (DULCOLAX) 10 MG suppository Place 10 mg rectally as needed for moderate constipation.   fish oil-omega-3 fatty acids 1000 MG capsule Take 1 g by mouth daily.   lisinopril (ZESTRIL) 20 MG tablet Take 1 tablet (20 mg total) by mouth daily.   loperamide (IMODIUM) 2 MG capsule Take 2 mg by mouth every 6 (six) hours as needed for diarrhea or loose stools.   Magnesium Hydroxide (MILK OF MAGNESIA PO) Take 30 mLs by mouth as needed.   ondansetron (ZOFRAN ODT) 4 MG disintegrating tablet Take 1 tablet (4 mg total) by mouth every 8 (eight) hours as needed for up to 15 doses for nausea or vomiting.   polyethylene glycol (MIRALAX / GLYCOLAX) 17 g  packet Take 17 g by mouth daily.   senna-docusate (SENOKOT-S) 8.6-50 MG tablet Take 2 tablets by mouth daily.   simvastatin (ZOCOR) 20 MG tablet Take 20 mg by mouth daily at 6 PM.    Sodium Phosphates (RA SALINE ENEMA RE) Place rectally as needed.   vitamin E 1000 UNIT capsule Take 1,000 Units by mouth daily.   [DISCONTINUED] loperamide (IMODIUM) 2 MG capsule Take 1 capsule (2 mg total) by mouth as needed for diarrhea or loose stools.   No facility-administered encounter medications on file as of 03/02/2021.    Review of Systems  GENERAL: No change in appetite, no fatigue, no weight  changes, no fever, chills or weakness MOUTH and THROAT: Denies oral discomfort, gingival pain or bleeding RESPIRATORY: no cough, SOB, DOE, wheezing, hemoptysis CARDIAC: No chest pain or palpitations GI: No abdominal pain, diarrhea, constipation, heart burn, nausea or vomiting GU: Denies dysuria, frequency, hematuria, incontinence, or discharge NEUROLOGICAL: Denies dizziness, syncope, numbness, or headache PSYCHIATRIC: Denies feelings of depression or anxiety. No report of hallucinations, insomnia, paranoia, or agitation   Immunization History  Administered Date(s) Administered   Influenza Split 06/20/2014, 08/04/2018   Influenza, High Dose Seasonal PF 06/04/2019, 07/20/2020   Influenza,inj,Quad PF,6+ Mos 06/20/2014, 05/25/2015, 06/17/2016, 07/22/2017, 08/05/2018, 07/16/2019, 07/19/2020   PFIZER Comirnaty(Gray Top)Covid-19 Tri-Sucrose Vaccine 11/29/2020   PFIZER(Purple Top)SARS-COV-2 Vaccination 11/09/2019, 12/01/2019, 11/29/2020   Pneumococcal Conjugate-13 11/28/2011   Pneumococcal Polysaccharide-23 08/17/2018   Tdap 06/28/2013   Zoster Recombinat (Shingrix) 02/11/2019   Zoster, Live 08/17/2018, 02/11/2019   Pertinent  Health Maintenance Due  Topic Date Due   INFLUENZA VACCINE  04/23/2021   PNA vac Low Risk Adult  Completed   Fall Risk  10/21/2016 06/17/2016 04/08/2016 02/06/2016 12/04/2015  Falls in the past year? No No No No No     Vitals:   03/02/21 1151  BP: (!) 144/89  Pulse: 95  Resp: 19  Temp: 97.7 F (36.5 C)  Weight: 162 lb 3.2 oz (73.6 kg)  Height: 5\' 5"  (1.651 m)   Body mass index is 26.99 kg/m.  Physical Exam  GENERAL APPEARANCE: Well nourished. In no acute distress. Normal body habitus SKIN:  Skin is warm and dry.  MOUTH and THROAT: Lips are without lesions. Oral mucosa is moist and without lesions.  RESPIRATORY: Breathing is even & unlabored, BS CTAB CARDIAC: RRR, no murmur,no extra heart sounds, nBLE 2+ edema GI: Abdomen soft, normal BS, no masses, no  tenderness, NEUROLOGICAL: There is no tremor. Speech is clear. Alert and oriented X 3. PSYCHIATRIC:  Affect and behavior are appropriate  Labs reviewed: Recent Labs    02/23/21 0950 02/23/21 1203 02/25/21 0557 02/26/21 0419 02/27/21 0149  NA 121*   < > 128* 131* 129*  K  --    < > 3.9 3.9 3.5  CL  --    < > 100 104 100  CO2  --    < > 20* 20* 19*  GLUCOSE  --    < > 85 101* 97  BUN  --    < > 15 19 25*  CREATININE  --    < > 0.Jesse 0.76 0.74  CALCIUM  --    < > 8.5* 9.1 9.2  MG 1.8  --   --   --   --   PHOS  --    < > 1.6* 2.8 3.5   < > = values in this interval not displayed.   Recent Labs    01/26/21 1119 02/19/21  1621 02/23/21 0310 02/24/21 0137 02/25/21 0557 02/26/21 0419 02/27/21 0149  AST 15 20 152*  --   --   --   --   ALT 15 20 70*  --   --   --   --   ALKPHOS 53 54 37*  --   --   --   --   BILITOT 1.0 0.9 2.9*  --   --   --   --   PROT 6.6 7.1 5.4*  --   --   --   --   ALBUMIN 4.1 4.0 2.8*   < > 2.4* 2.5* 2.6*   < > = values in this interval not displayed.   Recent Labs    12/03/20 1743 01/26/21 1119 02/19/21 1621 02/22/21 1744 02/23/21 0310 02/25/21 0557  WBC THIS TEST WAS ORDERED IN ERROR AND HAS BEEN CREDITED.  8.3 6.7   < > 18.2* 16.3* 13.3*  NEUTROABS 4.9 3.9  --  14.5*  --   --   HGB 14.0 14.3   < > 14.2 14.0 14.1  HCT 44.0 44.6   < > 42.7 40.2 42.5  MCV 86.6 85.6   < > 83.9 80.2 84.2  PLT 351 280   < > 151 285 335   < > = values in this interval not displayed.   Lab Results  Component Value Date   TSH 0.738 02/23/2021    Significant Diagnostic Results in last 30 days:  CT ABDOMEN PELVIS WO CONTRAST  Result Date: 02/19/2021 CLINICAL DATA:  Left lower quadrant pain for several hours, initial encounter EXAM: CT ABDOMEN AND PELVIS WITHOUT CONTRAST TECHNIQUE: Multidetector CT imaging of the abdomen and pelvis was performed following the standard protocol without IV contrast. COMPARISON:  12/03/2020 FINDINGS: Lower chest: Lung bases are free  of acute infiltrate or sizable effusion. Hepatobiliary: No focal liver abnormality is seen. No gallstones, gallbladder wall thickening, or biliary dilatation. Pancreas: Unremarkable. No pancreatic ductal dilatation or surrounding inflammatory changes. Spleen: Normal in size without focal abnormality. Adrenals/Urinary Tract: Adrenal glands are within normal limits. Right kidney demonstrates some mild perinephric stranding stable from the prior study. No obstructive changes are seen. The left kidney demonstrates some cortical cysts stable from the prior exam. Significant perinephric stranding is noted similar to that seen on the prior exam. No calculi are noted. The collecting system is prominent with a prominent extrarenal pelvis identified. This was reported as prominent cyst in the renal sinus on prior exam however a given the appearance this is felt to represent dilated renal pelvis. These changes have the appearance of a chronic UPJ obstruction. The ureter is well visualized and within normal limits. No calculi are seen. The bladder is well distended. Stomach/Bowel: Mild diverticular change of the colon is noted. No obstructive or inflammatory changes of the colon are noted. The appendix is well visualized and within normal limits. Small bowel and stomach appear unremarkable. Vascular/Lymphatic: Aortic atherosclerosis. No enlarged abdominal or pelvic lymph nodes. Reproductive: Prostate is unremarkable. Other: No abdominal wall hernia or abnormality. No abdominopelvic ascites. Musculoskeletal: Degenerative changes of the lumbar spine are noted. IMPRESSION: Findings in the left kidney most consistent with a chronic UPJ obstruction. The overall appearance is similar to that seen on the prior exam. This has been previously reported as a large renal pelvic cyst although the collecting system inter communicates with this consistent with hydronephrosis. Distal ureter on the left is within normal limits. Diverticulosis  without diverticulitis. No other focal abnormality is noted.  Electronically Signed   By: Inez Catalina M.D.   On: 02/19/2021 16:58   DG Chest 2 View  Result Date: 02/22/2021 CLINICAL DATA:  Fall, altered mental status EXAM: CHEST - 2 VIEW COMPARISON:  10/15/2014 FINDINGS: Heart and mediastinal contours are within normal limits. No focal opacities or effusions. No acute bony abnormality. Aortic atherosclerosis. IMPRESSION: No active cardiopulmonary disease. Electronically Signed   By: Rolm Baptise M.D.   On: 02/22/2021 18:41   CT Head Wo Contrast  Result Date: 02/22/2021 CLINICAL DATA:  Head and neck trauma, fell EXAM: CT HEAD WITHOUT CONTRAST TECHNIQUE: Contiguous axial images were obtained from the base of the skull through the vertex without intravenous contrast. COMPARISON:  None. FINDINGS: Brain: No acute infarct or hemorrhage. Focal hypodensities within the periventricular white matter and bilateral basal ganglia most consistent with chronic small vessel ischemic change. Lateral ventricles and remaining midline structures are unremarkable. No acute extra-axial fluid collections. No mass effect. Vascular: No hyperdense vessel or unexpected calcification. Skull: Normal. Negative for fracture or focal lesion. Sinuses/Orbits: No acute finding.  Postsurgical changes left globe. Other: None. IMPRESSION: 1. No acute intracranial process. Electronically Signed   By: Randa Ngo M.D.   On: 02/22/2021 21:07   CT Cervical Spine Wo Contrast  Result Date: 02/22/2021 CLINICAL DATA:  Head and neck trauma, fell EXAM: CT CERVICAL SPINE WITHOUT CONTRAST TECHNIQUE: Multidetector CT imaging of the cervical spine was performed without intravenous contrast. Multiplanar CT image reconstructions were also generated. COMPARISON:  None. FINDINGS: Alignment: Alignment is anatomic. Skull base and vertebrae: No acute fracture. No primary bone lesion or focal pathologic process. Soft tissues and spinal canal: No prevertebral  fluid or swelling. No visible canal hematoma. Disc levels: There is diffuse cervical spondylosis with prominent disc space narrowing and osteophyte formation from C3-4 through C6-7. Bilateral neural foraminal encroachment is seen at these levels. Hypertrophic changes at the C1-2 interface. Upper chest: Airway is patent. Likely diverticulum off the right posterolateral aspect of the trachea at the level of the thoracic inlet. Lung apices are clear. Other: Reconstructed images demonstrate no additional findings. IMPRESSION: 1. No acute cervical spine fracture. 2. Significant multilevel cervical spondylosis from C3-4 through C6-7, with associated neural foraminal encroachment. Electronically Signed   By: Randa Ngo M.D.   On: 02/22/2021 21:09   DG Knee Complete 4 Views Left  Result Date: 02/22/2021 CLINICAL DATA:  Fall with knee pain. EXAM: LEFT KNEE - COMPLETE 4+ VIEW COMPARISON:  None. FINDINGS: No evidence for an acute fracture. No subluxation or dislocation. No joint effusion. Calcification of the lateral meniscus noted. No worrisome lytic or sclerotic osseous abnormality. IMPRESSION: 1. No acute bony abnormality. 2. Chondrocalcinosis in the lateral meniscus. This can be associated with CPPD. Electronically Signed   By: Misty Stanley M.D.   On: 02/22/2021 18:40   DG Knee Complete 4 Views Right  Result Date: 02/22/2021 CLINICAL DATA:  Fall, knee pain EXAM: RIGHT KNEE - COMPLETE 4+ VIEW COMPARISON:  None. FINDINGS: Chondrocalcinosis. Joint spaces maintained. No acute bony abnormality. Specifically, no fracture, subluxation, or dislocation. No joint effusion. IMPRESSION: No acute bony abnormality. Electronically Signed   By: Rolm Baptise M.D.   On: 02/22/2021 18:41    Assessment/Plan  1. Hyponatremia Lab Results  Component Value Date   NA 129 (L) 02/27/2021   K 3.5 02/27/2021   CO2 19 (L) 02/27/2021   GLUCOSE 97 02/27/2021   BUN 25 (H) 02/27/2021   CREATININE 0.74 02/27/2021   CALCIUM 9.2  02/27/2021  GFRNONAA >60 02/27/2021   GFRAA >60 05/19/2020  -  stable   2. Hyperlipidemia, unspecified hyperlipidemia type - simvastatin (ZOCOR) 20 MG tablet; Take 1 tablet (20 mg total) by mouth daily at 6 PM.  Dispense: 30 tablet; Refill: 0  3. Essential hypertension - lisinopril (ZESTRIL) 20 MG tablet; Take 1 tablet (20 mg total) by mouth daily.  Dispense: 30 tablet; Refill: 0  4. Slow transit constipation -  continue Miralax     I have filled out patient's discharge paperwork and e-prescribed medications. He will have Home health PT and OT.  DME provided:  None  Total discharge time: Greater than 30 minutes Greater than 50% was spent in counseling and coordination of care.   Discharge time involved coordination of the discharge process with social worker, nursing staff and therapy department. Medical justification for home health services verified.    Durenda Age, DNP, MSN, FNP-BC [facility] [phone] (Monday-Friday 8:00 a.m. - 5:00 p.m.) [phone] (after hours)

## 2021-03-05 ENCOUNTER — Encounter: Payer: Self-pay | Admitting: Internal Medicine

## 2021-03-05 ENCOUNTER — Non-Acute Institutional Stay (SKILLED_NURSING_FACILITY): Payer: Medicare Other | Admitting: Internal Medicine

## 2021-03-05 DIAGNOSIS — N135 Crossing vessel and stricture of ureter without hydronephrosis: Secondary | ICD-10-CM | POA: Diagnosis not present

## 2021-03-05 DIAGNOSIS — W19XXXD Unspecified fall, subsequent encounter: Secondary | ICD-10-CM | POA: Diagnosis not present

## 2021-03-05 DIAGNOSIS — M47812 Spondylosis without myelopathy or radiculopathy, cervical region: Secondary | ICD-10-CM

## 2021-03-05 DIAGNOSIS — Y92009 Unspecified place in unspecified non-institutional (private) residence as the place of occurrence of the external cause: Secondary | ICD-10-CM | POA: Diagnosis not present

## 2021-03-05 DIAGNOSIS — E871 Hypo-osmolality and hyponatremia: Secondary | ICD-10-CM

## 2021-03-05 DIAGNOSIS — E441 Mild protein-calorie malnutrition: Secondary | ICD-10-CM

## 2021-03-05 DIAGNOSIS — R7401 Elevation of levels of liver transaminase levels: Secondary | ICD-10-CM | POA: Diagnosis not present

## 2021-03-05 MED ORDER — LISINOPRIL 20 MG PO TABS: 20.0000 mg | ORAL_TABLET | Freq: Every day | ORAL | 0 refills | Status: DC

## 2021-03-05 MED ORDER — SIMVASTATIN 20 MG PO TABS: 20.0000 mg | ORAL_TABLET | Freq: Every day | ORAL | 0 refills | Status: DC

## 2021-03-05 MED ORDER — ONDANSETRON 4 MG PO TBDP: 4.0000 mg | ORAL_TABLET | Freq: Three times a day (TID) | ORAL | 0 refills | Status: DC | PRN

## 2021-03-05 NOTE — Progress Notes (Signed)
NURSING HOME LOCATION:  [facility] ROOM NUMBER:  111 CODE STATUS:  Full Code  PCP:  Wenda Low MD  This is a comprehensive admission note to this SNFperformed on this date less than 30 days from date of admission. Included are preadmission medical/surgical history; reconciled medication list; family history; social history and comprehensive review of systems.  Corrections and additions to the records were documented. Comprehensive physical exam was also performed. Additionally a clinical summary was entered for each active diagnosis pertinent to this admission in the Problem List to enhance continuity of care.  HPI: Patient was hospitalized 6/3 - 02/27/2021 in the context of progressive weakness which resulted in a mechanical fall.  He reportedly fell onto his knees; there was no head injury, loss of consciousness, or any prodrome of a neurologic or cardiac nature prior to the fall.  In the ED sodium was found to be 111 in the context of sodium restriction because of history of hypertension for which he was on an ACE/HCTZ combination and in the context of recent diarrhea. Neurology consulted and recommended 3% normal saline which was administered overnight in the ICU under PCCM monitor.  He was transferred back to the Hospitalist service the next day ,and by 6/7 sodium was 129. The diarrhea proved noninfectious as per GI PCR panel. On 6/5 albumin was 2.4 and total protein 5.4 compatible with protein/caloric malnutrition.  He also exhibited some transaminitis with an AST of 152; ALT 70, and total bilirubin 2.9. An incidental finding on  CT abd/pelvis was a chronic left UPJ obstruction with hydronephrosis which was stable compared to prior imaging.  This had previously been documented as a large pelvic cyst.  Diverticulosis was also documented. Cervical spine imaging revealed spondylosis C3-4 through see 6-7 with neuroforaminal encroachment. PT/OT recommended SNF placement  for rehab as has had significant progressive weakness as noted.He lives with an 69 yo sister who has a caregiver 4 days/ week.  Past medical and surgical history: Includes polycythemia rubra vera, history depression, history of herpes zoster, and dyslipidemia. Surgical procedures include ophthalmologic interventions.  Social history: Nondrinker; never smoked.  He is a retired Regulatory affairs officer.  Family history: Limited history reviewed.   Review of systems: He states that he fell because he was "shaky with hypertension & all".  He denied any cardiac or neurologic prodrome prior to the fall.  He was unaware of the association between the hyponatremia and the HCTZ therapy. He now states he is going home today or "I will go crazy".  He states he has been here 5 days and feels he does not need 2 additional days of PT/OT.  He does note chronic asymmetric weakness of the lower extremities greater on the left. Off the HCTZ because of the hyponatremia he has had the reoccurrence of edema.  He denies any associated cardiopulmonary symptoms. He is also had some loose stool.  Constitutional: No fever, significant weight change  Eyes: No redness, discharge, pain, vision change ENT/mouth: No nasal congestion, purulent discharge, earache, change in hearing, sore throat  Cardiovascular: No chest pain, palpitations, paroxysmal nocturnal dyspnea, claudication, edema  Respiratory: No cough, sputum production, hemoptysis, DOE, significant snoring, apnea  Gastrointestinal: No heartburn, dysphagia, abdominal pain, nausea /vomiting, rectal bleeding, melena Genitourinary: No dysuria, hematuria, pyuria, incontinence, nocturia Musculoskeletal: No joint stiffness, joint swelling, pain Dermatologic: No rash, pruritus, change in appearance of skin Neurologic: No dizziness, headache, syncope, seizures, numbness, tingling Psychiatric: No significant anxiety, depression, insomnia, anorexia Endocrine: No change  in  hair/skin/nails, excessive thirst, excessive hunger, excessive urination  Hematologic/lymphatic: No significant bruising, lymphadenopathy, abnormal bleeding Allergy/immunology: No itchy/watery eyes, significant sneezing, urticaria, angioedema  Physical exam:  Pertinent or positive findings: Ptosis is present laterally in both eyes, greater on the left.  There is subtle anisocoria suggested with the right pupil minimally larger than the left.  There is a bronchovesicular quality to the breath sounds.  Pedal pulses are decreased.  He has 1+ edema despite wearing TED hose.  There is minimal asymmetric weakness with the left lower extremity weaker than the right.  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  General appearance: Adequately nourished; no acute distress, increased work of breathing is present.   Lymphatic: No lymphadenopathy about the head, neck, axilla. Eyes: No conjunctival inflammation or lid edema is present. There is no scleral icterus. Ears:  External ear exam shows no significant lesions or deformities.   Nose:  External nasal examination shows no deformity or inflammation. Nasal mucosa are pink and moist without lesions, exudates Oral exam: Lips and gums are healthy appearing.There is no oropharyngeal erythema or exudate. Neck:  No thyromegaly, masses, tenderness noted.    Heart:  Normal rate and regular rhythm. S1 and S2 normal without gallop, murmur, click, rub.  Lungs: without wheezes, rhonchi, rales, rubs. Abdomen: Bowel sounds are normal.  Abdomen is soft and nontender with no organomegaly, hernias, masses. GU: Deferred  Extremities:  No cyanosis, clubbing. Neurologic exam: Balance, Rhomberg, finger to nose testing could not be completed due to clinical state Skin: Warm & dry w/o tenting. No significant lesions or rash.  See clinical summary under each active problem in the Problem List with associated updated therapeutic plan

## 2021-03-06 ENCOUNTER — Encounter: Payer: Self-pay | Admitting: Internal Medicine

## 2021-03-06 DIAGNOSIS — E7800 Pure hypercholesterolemia, unspecified: Secondary | ICD-10-CM | POA: Diagnosis not present

## 2021-03-06 DIAGNOSIS — Z7982 Long term (current) use of aspirin: Secondary | ICD-10-CM | POA: Diagnosis not present

## 2021-03-06 DIAGNOSIS — D45 Polycythemia vera: Secondary | ICD-10-CM | POA: Diagnosis not present

## 2021-03-06 DIAGNOSIS — E871 Hypo-osmolality and hyponatremia: Secondary | ICD-10-CM | POA: Diagnosis not present

## 2021-03-06 DIAGNOSIS — I10 Essential (primary) hypertension: Secondary | ICD-10-CM | POA: Diagnosis not present

## 2021-03-06 DIAGNOSIS — F32A Depression, unspecified: Secondary | ICD-10-CM | POA: Diagnosis not present

## 2021-03-06 DIAGNOSIS — M792 Neuralgia and neuritis, unspecified: Secondary | ICD-10-CM | POA: Diagnosis not present

## 2021-03-06 DIAGNOSIS — Z9181 History of falling: Secondary | ICD-10-CM | POA: Diagnosis not present

## 2021-03-06 NOTE — Assessment & Plan Note (Signed)
No active GU symptoms.

## 2021-03-06 NOTE — Patient Instructions (Signed)
See assessment and plan under each diagnosis in the problem list and acutely for this visit 

## 2021-03-06 NOTE — Assessment & Plan Note (Signed)
Patient has benefited from PT/OT & is clinically stable & ready for discharge.

## 2021-03-06 NOTE — Assessment & Plan Note (Signed)
I explained pathophysiology of hyponatremia. HCTZ entered as "Allergy" to prevent represcribing. Furosemide vs Spironolactone as per PCP if edema persists. No CKD present.

## 2021-03-06 NOTE — Assessment & Plan Note (Signed)
He describes asymmetric LE weakness. Neurology assessment if weakness progressive.

## 2021-03-06 NOTE — Assessment & Plan Note (Signed)
No GI symptoms , jaundice or clay colored stool. Monitor as per PCP.

## 2021-03-06 NOTE — Assessment & Plan Note (Signed)
Protein supplementation.

## 2021-03-08 DIAGNOSIS — D72829 Elevated white blood cell count, unspecified: Secondary | ICD-10-CM | POA: Diagnosis not present

## 2021-03-08 DIAGNOSIS — I10 Essential (primary) hypertension: Secondary | ICD-10-CM | POA: Diagnosis not present

## 2021-03-08 DIAGNOSIS — E871 Hypo-osmolality and hyponatremia: Secondary | ICD-10-CM | POA: Diagnosis not present

## 2021-03-08 DIAGNOSIS — F32A Depression, unspecified: Secondary | ICD-10-CM | POA: Diagnosis not present

## 2021-03-08 DIAGNOSIS — R350 Frequency of micturition: Secondary | ICD-10-CM | POA: Diagnosis not present

## 2021-03-08 DIAGNOSIS — M792 Neuralgia and neuritis, unspecified: Secondary | ICD-10-CM | POA: Diagnosis not present

## 2021-03-08 DIAGNOSIS — Z9181 History of falling: Secondary | ICD-10-CM | POA: Diagnosis not present

## 2021-03-08 DIAGNOSIS — D45 Polycythemia vera: Secondary | ICD-10-CM | POA: Diagnosis not present

## 2021-03-08 DIAGNOSIS — K5900 Constipation, unspecified: Secondary | ICD-10-CM | POA: Diagnosis not present

## 2021-03-08 DIAGNOSIS — Z7982 Long term (current) use of aspirin: Secondary | ICD-10-CM | POA: Diagnosis not present

## 2021-03-08 DIAGNOSIS — E7800 Pure hypercholesterolemia, unspecified: Secondary | ICD-10-CM | POA: Diagnosis not present

## 2021-03-13 DIAGNOSIS — Z9181 History of falling: Secondary | ICD-10-CM | POA: Diagnosis not present

## 2021-03-13 DIAGNOSIS — F32A Depression, unspecified: Secondary | ICD-10-CM | POA: Diagnosis not present

## 2021-03-13 DIAGNOSIS — E871 Hypo-osmolality and hyponatremia: Secondary | ICD-10-CM | POA: Diagnosis not present

## 2021-03-13 DIAGNOSIS — I10 Essential (primary) hypertension: Secondary | ICD-10-CM | POA: Diagnosis not present

## 2021-03-13 DIAGNOSIS — Z7982 Long term (current) use of aspirin: Secondary | ICD-10-CM | POA: Diagnosis not present

## 2021-03-13 DIAGNOSIS — E7800 Pure hypercholesterolemia, unspecified: Secondary | ICD-10-CM | POA: Diagnosis not present

## 2021-03-13 DIAGNOSIS — M792 Neuralgia and neuritis, unspecified: Secondary | ICD-10-CM | POA: Diagnosis not present

## 2021-03-13 DIAGNOSIS — D45 Polycythemia vera: Secondary | ICD-10-CM | POA: Diagnosis not present

## 2021-03-15 DIAGNOSIS — F32A Depression, unspecified: Secondary | ICD-10-CM | POA: Diagnosis not present

## 2021-03-15 DIAGNOSIS — Z7982 Long term (current) use of aspirin: Secondary | ICD-10-CM | POA: Diagnosis not present

## 2021-03-15 DIAGNOSIS — I10 Essential (primary) hypertension: Secondary | ICD-10-CM | POA: Diagnosis not present

## 2021-03-15 DIAGNOSIS — Z9181 History of falling: Secondary | ICD-10-CM | POA: Diagnosis not present

## 2021-03-15 DIAGNOSIS — D45 Polycythemia vera: Secondary | ICD-10-CM | POA: Diagnosis not present

## 2021-03-15 DIAGNOSIS — E871 Hypo-osmolality and hyponatremia: Secondary | ICD-10-CM | POA: Diagnosis not present

## 2021-03-15 DIAGNOSIS — E7800 Pure hypercholesterolemia, unspecified: Secondary | ICD-10-CM | POA: Diagnosis not present

## 2021-03-15 DIAGNOSIS — M792 Neuralgia and neuritis, unspecified: Secondary | ICD-10-CM | POA: Diagnosis not present

## 2021-03-20 DIAGNOSIS — E871 Hypo-osmolality and hyponatremia: Secondary | ICD-10-CM | POA: Diagnosis not present

## 2021-03-20 DIAGNOSIS — M792 Neuralgia and neuritis, unspecified: Secondary | ICD-10-CM | POA: Diagnosis not present

## 2021-03-20 DIAGNOSIS — Z9181 History of falling: Secondary | ICD-10-CM | POA: Diagnosis not present

## 2021-03-20 DIAGNOSIS — Z7982 Long term (current) use of aspirin: Secondary | ICD-10-CM | POA: Diagnosis not present

## 2021-03-20 DIAGNOSIS — F32A Depression, unspecified: Secondary | ICD-10-CM | POA: Diagnosis not present

## 2021-03-20 DIAGNOSIS — D45 Polycythemia vera: Secondary | ICD-10-CM | POA: Diagnosis not present

## 2021-03-20 DIAGNOSIS — I10 Essential (primary) hypertension: Secondary | ICD-10-CM | POA: Diagnosis not present

## 2021-03-20 DIAGNOSIS — E7800 Pure hypercholesterolemia, unspecified: Secondary | ICD-10-CM | POA: Diagnosis not present

## 2021-03-23 ENCOUNTER — Telehealth: Payer: Self-pay | Admitting: *Deleted

## 2021-03-23 DIAGNOSIS — Z9181 History of falling: Secondary | ICD-10-CM | POA: Diagnosis not present

## 2021-03-23 DIAGNOSIS — Z7982 Long term (current) use of aspirin: Secondary | ICD-10-CM | POA: Diagnosis not present

## 2021-03-23 DIAGNOSIS — D45 Polycythemia vera: Secondary | ICD-10-CM | POA: Diagnosis not present

## 2021-03-23 DIAGNOSIS — F32A Depression, unspecified: Secondary | ICD-10-CM | POA: Diagnosis not present

## 2021-03-23 DIAGNOSIS — E7800 Pure hypercholesterolemia, unspecified: Secondary | ICD-10-CM | POA: Diagnosis not present

## 2021-03-23 DIAGNOSIS — M792 Neuralgia and neuritis, unspecified: Secondary | ICD-10-CM | POA: Diagnosis not present

## 2021-03-23 DIAGNOSIS — E871 Hypo-osmolality and hyponatremia: Secondary | ICD-10-CM | POA: Diagnosis not present

## 2021-03-23 DIAGNOSIS — I10 Essential (primary) hypertension: Secondary | ICD-10-CM | POA: Diagnosis not present

## 2021-03-23 NOTE — Telephone Encounter (Signed)
Called patient to reschedule appointment from 03/28/21 to 03/29/21 with Judson Roch. Patient confirmed

## 2021-03-27 ENCOUNTER — Other Ambulatory Visit: Payer: Self-pay | Admitting: Adult Health

## 2021-03-27 DIAGNOSIS — Z7982 Long term (current) use of aspirin: Secondary | ICD-10-CM | POA: Diagnosis not present

## 2021-03-27 DIAGNOSIS — E785 Hyperlipidemia, unspecified: Secondary | ICD-10-CM

## 2021-03-27 DIAGNOSIS — D45 Polycythemia vera: Secondary | ICD-10-CM | POA: Diagnosis not present

## 2021-03-27 DIAGNOSIS — E871 Hypo-osmolality and hyponatremia: Secondary | ICD-10-CM | POA: Diagnosis not present

## 2021-03-27 DIAGNOSIS — E7800 Pure hypercholesterolemia, unspecified: Secondary | ICD-10-CM | POA: Diagnosis not present

## 2021-03-27 DIAGNOSIS — I10 Essential (primary) hypertension: Secondary | ICD-10-CM | POA: Diagnosis not present

## 2021-03-27 DIAGNOSIS — Z9181 History of falling: Secondary | ICD-10-CM | POA: Diagnosis not present

## 2021-03-27 DIAGNOSIS — M792 Neuralgia and neuritis, unspecified: Secondary | ICD-10-CM | POA: Diagnosis not present

## 2021-03-27 DIAGNOSIS — F32A Depression, unspecified: Secondary | ICD-10-CM | POA: Diagnosis not present

## 2021-03-28 ENCOUNTER — Inpatient Hospital Stay: Payer: Medicare Other

## 2021-03-28 ENCOUNTER — Inpatient Hospital Stay: Payer: Medicare Other | Admitting: Family

## 2021-03-28 ENCOUNTER — Telehealth: Payer: Self-pay | Admitting: *Deleted

## 2021-03-28 NOTE — Telephone Encounter (Signed)
Called patient about  appointment being rescheduled from 03/29/21 to 04/13/21 - patient confirm.

## 2021-03-29 ENCOUNTER — Other Ambulatory Visit: Payer: Medicare Other

## 2021-03-29 ENCOUNTER — Ambulatory Visit: Payer: Medicare Other | Admitting: Family

## 2021-03-30 DIAGNOSIS — E871 Hypo-osmolality and hyponatremia: Secondary | ICD-10-CM | POA: Diagnosis not present

## 2021-03-30 DIAGNOSIS — D45 Polycythemia vera: Secondary | ICD-10-CM | POA: Diagnosis not present

## 2021-03-30 DIAGNOSIS — F32A Depression, unspecified: Secondary | ICD-10-CM | POA: Diagnosis not present

## 2021-03-30 DIAGNOSIS — E7800 Pure hypercholesterolemia, unspecified: Secondary | ICD-10-CM | POA: Diagnosis not present

## 2021-03-30 DIAGNOSIS — Z7982 Long term (current) use of aspirin: Secondary | ICD-10-CM | POA: Diagnosis not present

## 2021-03-30 DIAGNOSIS — M792 Neuralgia and neuritis, unspecified: Secondary | ICD-10-CM | POA: Diagnosis not present

## 2021-03-30 DIAGNOSIS — I10 Essential (primary) hypertension: Secondary | ICD-10-CM | POA: Diagnosis not present

## 2021-03-30 DIAGNOSIS — Z9181 History of falling: Secondary | ICD-10-CM | POA: Diagnosis not present

## 2021-04-01 ENCOUNTER — Other Ambulatory Visit: Payer: Self-pay | Admitting: Adult Health

## 2021-04-01 DIAGNOSIS — E785 Hyperlipidemia, unspecified: Secondary | ICD-10-CM

## 2021-04-01 DIAGNOSIS — E871 Hypo-osmolality and hyponatremia: Secondary | ICD-10-CM

## 2021-04-03 DIAGNOSIS — Z7982 Long term (current) use of aspirin: Secondary | ICD-10-CM | POA: Diagnosis not present

## 2021-04-03 DIAGNOSIS — E871 Hypo-osmolality and hyponatremia: Secondary | ICD-10-CM | POA: Diagnosis not present

## 2021-04-03 DIAGNOSIS — D45 Polycythemia vera: Secondary | ICD-10-CM | POA: Diagnosis not present

## 2021-04-03 DIAGNOSIS — Z9181 History of falling: Secondary | ICD-10-CM | POA: Diagnosis not present

## 2021-04-03 DIAGNOSIS — M792 Neuralgia and neuritis, unspecified: Secondary | ICD-10-CM | POA: Diagnosis not present

## 2021-04-03 DIAGNOSIS — E7800 Pure hypercholesterolemia, unspecified: Secondary | ICD-10-CM | POA: Diagnosis not present

## 2021-04-03 DIAGNOSIS — I10 Essential (primary) hypertension: Secondary | ICD-10-CM | POA: Diagnosis not present

## 2021-04-03 DIAGNOSIS — F32A Depression, unspecified: Secondary | ICD-10-CM | POA: Diagnosis not present

## 2021-04-05 DIAGNOSIS — D45 Polycythemia vera: Secondary | ICD-10-CM | POA: Diagnosis not present

## 2021-04-05 DIAGNOSIS — M792 Neuralgia and neuritis, unspecified: Secondary | ICD-10-CM | POA: Diagnosis not present

## 2021-04-05 DIAGNOSIS — E7800 Pure hypercholesterolemia, unspecified: Secondary | ICD-10-CM | POA: Diagnosis not present

## 2021-04-05 DIAGNOSIS — F32A Depression, unspecified: Secondary | ICD-10-CM | POA: Diagnosis not present

## 2021-04-05 DIAGNOSIS — Z9181 History of falling: Secondary | ICD-10-CM | POA: Diagnosis not present

## 2021-04-05 DIAGNOSIS — Z7982 Long term (current) use of aspirin: Secondary | ICD-10-CM | POA: Diagnosis not present

## 2021-04-05 DIAGNOSIS — E871 Hypo-osmolality and hyponatremia: Secondary | ICD-10-CM | POA: Diagnosis not present

## 2021-04-05 DIAGNOSIS — I10 Essential (primary) hypertension: Secondary | ICD-10-CM | POA: Diagnosis not present

## 2021-04-06 ENCOUNTER — Other Ambulatory Visit: Payer: Self-pay | Admitting: Adult Health

## 2021-04-06 DIAGNOSIS — E871 Hypo-osmolality and hyponatremia: Secondary | ICD-10-CM

## 2021-04-06 DIAGNOSIS — E785 Hyperlipidemia, unspecified: Secondary | ICD-10-CM

## 2021-04-10 DIAGNOSIS — Z9181 History of falling: Secondary | ICD-10-CM | POA: Diagnosis not present

## 2021-04-10 DIAGNOSIS — I10 Essential (primary) hypertension: Secondary | ICD-10-CM | POA: Diagnosis not present

## 2021-04-10 DIAGNOSIS — M792 Neuralgia and neuritis, unspecified: Secondary | ICD-10-CM | POA: Diagnosis not present

## 2021-04-10 DIAGNOSIS — Z7982 Long term (current) use of aspirin: Secondary | ICD-10-CM | POA: Diagnosis not present

## 2021-04-10 DIAGNOSIS — E871 Hypo-osmolality and hyponatremia: Secondary | ICD-10-CM | POA: Diagnosis not present

## 2021-04-10 DIAGNOSIS — F32A Depression, unspecified: Secondary | ICD-10-CM | POA: Diagnosis not present

## 2021-04-10 DIAGNOSIS — E7800 Pure hypercholesterolemia, unspecified: Secondary | ICD-10-CM | POA: Diagnosis not present

## 2021-04-10 DIAGNOSIS — D45 Polycythemia vera: Secondary | ICD-10-CM | POA: Diagnosis not present

## 2021-04-12 DIAGNOSIS — E7800 Pure hypercholesterolemia, unspecified: Secondary | ICD-10-CM | POA: Diagnosis not present

## 2021-04-12 DIAGNOSIS — F32A Depression, unspecified: Secondary | ICD-10-CM | POA: Diagnosis not present

## 2021-04-12 DIAGNOSIS — M792 Neuralgia and neuritis, unspecified: Secondary | ICD-10-CM | POA: Diagnosis not present

## 2021-04-12 DIAGNOSIS — Z7982 Long term (current) use of aspirin: Secondary | ICD-10-CM | POA: Diagnosis not present

## 2021-04-12 DIAGNOSIS — Z9181 History of falling: Secondary | ICD-10-CM | POA: Diagnosis not present

## 2021-04-12 DIAGNOSIS — D45 Polycythemia vera: Secondary | ICD-10-CM | POA: Diagnosis not present

## 2021-04-12 DIAGNOSIS — I10 Essential (primary) hypertension: Secondary | ICD-10-CM | POA: Diagnosis not present

## 2021-04-12 DIAGNOSIS — E871 Hypo-osmolality and hyponatremia: Secondary | ICD-10-CM | POA: Diagnosis not present

## 2021-04-13 ENCOUNTER — Encounter: Payer: Self-pay | Admitting: Family

## 2021-04-13 ENCOUNTER — Other Ambulatory Visit: Payer: Self-pay

## 2021-04-13 ENCOUNTER — Inpatient Hospital Stay: Payer: Medicare Other

## 2021-04-13 ENCOUNTER — Inpatient Hospital Stay: Payer: Medicare Other | Attending: Family

## 2021-04-13 ENCOUNTER — Telehealth: Payer: Self-pay

## 2021-04-13 ENCOUNTER — Inpatient Hospital Stay: Payer: Medicare Other | Admitting: Family

## 2021-04-13 VITALS — BP 142/81 | HR 98 | Temp 98.3°F | Resp 19 | Ht 66.0 in | Wt 146.1 lb

## 2021-04-13 DIAGNOSIS — D751 Secondary polycythemia: Secondary | ICD-10-CM

## 2021-04-13 DIAGNOSIS — R5383 Other fatigue: Secondary | ICD-10-CM | POA: Insufficient documentation

## 2021-04-13 DIAGNOSIS — M545 Low back pain, unspecified: Secondary | ICD-10-CM | POA: Insufficient documentation

## 2021-04-13 DIAGNOSIS — G8929 Other chronic pain: Secondary | ICD-10-CM | POA: Diagnosis not present

## 2021-04-13 DIAGNOSIS — D500 Iron deficiency anemia secondary to blood loss (chronic): Secondary | ICD-10-CM

## 2021-04-13 LAB — CMP (CANCER CENTER ONLY)
ALT: 12 U/L (ref 0–44)
AST: 14 U/L — ABNORMAL LOW (ref 15–41)
Albumin: 4 g/dL (ref 3.5–5.0)
Alkaline Phosphatase: 59 U/L (ref 38–126)
Anion gap: 9 (ref 5–15)
BUN: 13 mg/dL (ref 8–23)
CO2: 26 mmol/L (ref 22–32)
Calcium: 10.3 mg/dL (ref 8.9–10.3)
Chloride: 103 mmol/L (ref 98–111)
Creatinine: 0.79 mg/dL (ref 0.61–1.24)
GFR, Estimated: >60 mL/min (ref >60)
Glucose, Bld: 113 mg/dL — ABNORMAL HIGH (ref 70–99)
Potassium: 3.5 mmol/L (ref 3.5–5.1)
Sodium: 138 mmol/L (ref 135–145)
Total Bilirubin: 1.3 mg/dL — ABNORMAL HIGH (ref 0.3–1.2)
Total Protein: 6.7 g/dL (ref 6.5–8.1)

## 2021-04-13 LAB — CBC WITH DIFFERENTIAL (CANCER CENTER ONLY)
Abs Immature Granulocytes: 0.03 10*3/uL (ref 0.00–0.07)
Basophils Absolute: 0 10*3/uL (ref 0.0–0.1)
Basophils Relative: 1 %
Eosinophils Absolute: 0.1 10*3/uL (ref 0.0–0.5)
Eosinophils Relative: 1 %
HCT: 45.4 % (ref 39.0–52.0)
Hemoglobin: 15 g/dL (ref 13.0–17.0)
Immature Granulocytes: 0 %
Lymphocytes Relative: 26 %
Lymphs Abs: 1.9 10*3/uL (ref 0.7–4.0)
MCH: 29.5 pg (ref 26.0–34.0)
MCHC: 33 g/dL (ref 30.0–36.0)
MCV: 89.4 fL (ref 80.0–100.0)
Monocytes Absolute: 1 10*3/uL (ref 0.1–1.0)
Monocytes Relative: 13 %
Neutro Abs: 4.3 10*3/uL (ref 1.7–7.7)
Neutrophils Relative %: 59 %
Platelet Count: 296 10*3/uL (ref 150–400)
RBC: 5.08 MIL/uL (ref 4.22–5.81)
RDW: 19.5 % — ABNORMAL HIGH (ref 11.5–15.5)
WBC Count: 7.3 10*3/uL (ref 4.0–10.5)
nRBC: 0 % (ref 0.0–0.2)

## 2021-04-13 NOTE — Progress Notes (Signed)
Hematology and Oncology Follow Up Visit  Jesse [name] [id number] [birth year] 80 y.o. 04/13/2021   Principle Diagnosis:  Polycythemia - JAK2 negative   Current Therapy:        Phlebotomy to maintain hematocrit below 45% Aspirin 81 mg by mouth daily   Interim History:  Jesse [name] is here today for follow-up. He is recuperating from his hospitalization with weakness  and fall secondary to hyponatremia. He went to a SNF after discharge for a few days due to persistent weakness and did not have the best experience. He is now home and slowly improving day by day.  He is in PT at home.  Hct today is 45.4%.  He is still feeling quite fatigued.  He has chronic lower back pain and states that he has an appointment coming up on the 28th with Orthopedist  Dr. Louanne Skye.  No fever, chills, n/v, cough, rash, dizziness, SOB, chest pain, palpitations, abdominal pain or changes in bowel or bladder habits.  No swelling, numbness or tingling in his extremities.  No falls or syncope since discharge.  He still does not have much of an appetite but is trying to stay well hydrated. His weight is 146 lbs.   ECOG Performance Status: 1 - Symptomatic but completely ambulatory  Medications:  Allergies as of 04/13/2021       Reactions   Hctz [hydrochlorothiazide] Other (See Comments)   Hyponatremia ; Na+ 111, w weakness resulting in mechanical fall   Tramadol Nausea And Vomiting        Medication List        Accurate as of April 13, 2021  2:50 PM. If you have any questions, ask your nurse or doctor.          ALPRAZolam 1 MG tablet Commonly known as: XANAX Take 0.5 mg by mouth at bedtime.   aspirin EC 81 MG tablet Take 81 mg by mouth daily.   bisacodyl 10 MG suppository Commonly known as: DULCOLAX Place 10 mg rectally as needed for moderate constipation.   fish oil-omega-3 fatty acids 1000 MG capsule Take 1 g by mouth daily.   lisinopril 20 MG tablet Commonly known as: ZESTRIL Take 1  tablet (20 mg total) by mouth daily.   loperamide 2 MG capsule Commonly known as: IMODIUM Take 2 mg by mouth every 6 (six) hours as needed for diarrhea or loose stools.   MILK OF MAGNESIA PO Take 30 mLs by mouth as needed.   ondansetron 4 MG disintegrating tablet Commonly known as: Zofran ODT Take 1 tablet (4 mg total) by mouth every 8 (eight) hours as needed for up to 15 doses for nausea or vomiting.   polyethylene glycol 17 g packet Commonly known as: MIRALAX / GLYCOLAX Take 17 g by mouth daily.   RA SALINE ENEMA RE Place rectally as needed.   simvastatin 20 MG tablet Commonly known as: ZOCOR Take 1 tablet (20 mg total) by mouth daily at 6 PM.   tamsulosin 0.4 MG Caps capsule Commonly known as: FLOMAX Take 0.4 mg by mouth daily.   vitamin E 1000 UNIT capsule Take 1,000 Units by mouth daily.        Allergies:  Allergies  Allergen Reactions   Hctz [Hydrochlorothiazide] Other (See Comments)    Hyponatremia ; Na+ 111, w weakness resulting in mechanical fall   Tramadol Nausea And Vomiting    Past Medical History, Surgical history, Social history, and Family History were reviewed and updated.  Review of Systems: All other  10 point review of systems is negative.   Physical Exam:  height is '5\' 6"'$  (1.676 m) and weight is 146 lb 1.9 oz (66.3 kg). His oral temperature is 98.3 F (36.8 C). His blood pressure is 142/81 (abnormal) and his pulse is 98. His respiration is 19 and oxygen saturation is 98%.   Wt Readings from Last 3 Encounters:  04/13/21 146 lb 1.9 oz (66.3 kg)  03/05/21 162 lb 3.2 oz (73.6 kg)  03/02/21 162 lb 3.2 oz (73.6 kg)    Ocular: Sclerae unicteric, pupils equal, round and reactive to light Ear-nose-throat: Oropharynx clear, dentition fair Lymphatic: No cervical or supraclavicular adenopathy Lungs no rales or rhonchi, good excursion bilaterally Heart regular rate and rhythm, no murmur appreciated Abd soft, nontender, positive bowel sounds MSK no  focal spinal tenderness, no joint edema Neuro: non-focal, well-oriented, appropriate affect Breasts: Deferred   Lab Results  Component Value Date   WBC 7.3 04/13/2021   HGB 15.0 04/13/2021   HCT 45.4 04/13/2021   MCV 89.4 04/13/2021   PLT 296 04/13/2021   Lab Results  Component Value Date   FERRITIN 16 (L) 01/26/2021   IRON 50 01/26/2021   TIBC 405 01/26/2021   UIBC 355 01/26/2021   IRONPCTSAT 12 (L) 01/26/2021   Lab Results  Component Value Date   RETICCTPCT 1.5 01/11/2020   RBC 5.08 04/13/2021   RETICCTABS 87.5 01/02/2011   No results found for: KPAFRELGTCHN, LAMBDASER, KAPLAMBRATIO No results found for: IGGSERUM, IGA, IGMSERUM No results found for: Odetta Pink, SPEI   Chemistry      Component Value Date/Time   NA 138 04/13/2021 1424   NA 136 08/19/2017 1149   NA 135 (L) 10/21/2016 1151   K 3.5 04/13/2021 1424   K 3.8 08/19/2017 1149   K 3.9 10/21/2016 1151   CL 103 04/13/2021 1424   CL 95 (L) 08/19/2017 1149   CO2 26 04/13/2021 1424   CO2 28 08/19/2017 1149   CO2 25 10/21/2016 1151   BUN 13 04/13/2021 1424   BUN 14 08/19/2017 1149   BUN 13.6 10/21/2016 1151   CREATININE 0.79 04/13/2021 1424   CREATININE 1.0 08/19/2017 1149   CREATININE 0.8 10/21/2016 1151      Component Value Date/Time   CALCIUM 10.3 04/13/2021 1424   CALCIUM 10.1 08/19/2017 1149   CALCIUM 10.3 10/21/2016 1151   ALKPHOS 59 04/13/2021 1424   ALKPHOS 68 08/19/2017 1149   ALKPHOS 68 10/21/2016 1151   AST 14 (L) 04/13/2021 1424   AST 25 10/21/2016 1151   ALT 12 04/13/2021 1424   ALT 32 08/19/2017 1149   ALT 38 10/21/2016 1151   BILITOT 1.3 (H) 04/13/2021 1424   BILITOT 1.22 (H) 10/21/2016 1151       Impression and Plan: Jesse [name] is a very pleasant 80 yo caucasian gentleman with polycythemia, JAK-2 negative.  No phlebotomy today, Hct is 45.4%. We will hold due to recent hospitalization and fatigue.  Follow-up in 6 weeks.  He  can contact our office with any questions or concerns.   Laverna Peace, NP 7/22/20222:50 PM

## 2021-04-13 NOTE — Telephone Encounter (Signed)
Appts made and printed per pt per verbal los 04/13/21   Mry Lamia

## 2021-04-16 LAB — IRON AND TIBC
Iron: 93 ug/dL (ref 42–163)
Saturation Ratios: 27 % (ref 20–55)
TIBC: 344 ug/dL (ref 202–409)
UIBC: 251 ug/dL (ref 117–376)

## 2021-04-16 LAB — FERRITIN: Ferritin: 27 ng/mL (ref 24–336)

## 2021-04-17 DIAGNOSIS — F32A Depression, unspecified: Secondary | ICD-10-CM | POA: Diagnosis not present

## 2021-04-17 DIAGNOSIS — D45 Polycythemia vera: Secondary | ICD-10-CM | POA: Diagnosis not present

## 2021-04-17 DIAGNOSIS — Z9181 History of falling: Secondary | ICD-10-CM | POA: Diagnosis not present

## 2021-04-17 DIAGNOSIS — Z7982 Long term (current) use of aspirin: Secondary | ICD-10-CM | POA: Diagnosis not present

## 2021-04-17 DIAGNOSIS — E7800 Pure hypercholesterolemia, unspecified: Secondary | ICD-10-CM | POA: Diagnosis not present

## 2021-04-17 DIAGNOSIS — I10 Essential (primary) hypertension: Secondary | ICD-10-CM | POA: Diagnosis not present

## 2021-04-17 DIAGNOSIS — M792 Neuralgia and neuritis, unspecified: Secondary | ICD-10-CM | POA: Diagnosis not present

## 2021-04-17 DIAGNOSIS — E871 Hypo-osmolality and hyponatremia: Secondary | ICD-10-CM | POA: Diagnosis not present

## 2021-04-19 ENCOUNTER — Encounter: Payer: Self-pay | Admitting: Specialist

## 2021-04-19 ENCOUNTER — Ambulatory Visit: Payer: Self-pay

## 2021-04-19 ENCOUNTER — Ambulatory Visit: Payer: Medicare Other | Admitting: Specialist

## 2021-04-19 ENCOUNTER — Other Ambulatory Visit: Payer: Self-pay

## 2021-04-19 VITALS — BP 153/85 | HR 97 | Ht 66.0 in | Wt 146.1 lb

## 2021-04-19 DIAGNOSIS — M4726 Other spondylosis with radiculopathy, lumbar region: Secondary | ICD-10-CM | POA: Diagnosis not present

## 2021-04-19 DIAGNOSIS — M47812 Spondylosis without myelopathy or radiculopathy, cervical region: Secondary | ICD-10-CM | POA: Diagnosis not present

## 2021-04-19 DIAGNOSIS — M545 Low back pain, unspecified: Secondary | ICD-10-CM

## 2021-04-19 DIAGNOSIS — M5412 Radiculopathy, cervical region: Secondary | ICD-10-CM | POA: Diagnosis not present

## 2021-04-19 DIAGNOSIS — M4156 Other secondary scoliosis, lumbar region: Secondary | ICD-10-CM

## 2021-04-19 MED ORDER — MELOXICAM 15 MG PO TABS: 15.0000 mg | ORAL_TABLET | Freq: Every day | ORAL | 2 refills | Status: DC

## 2021-04-19 MED ORDER — AMITRIPTYLINE HCL 10 MG PO TABS: 10.0000 mg | ORAL_TABLET | Freq: Every day | ORAL | 3 refills | Status: DC

## 2021-04-19 NOTE — Addendum Note (Signed)
Addended by: Basil Dess on: 04/19/2021 12:09 PM   Modules accepted: Orders

## 2021-04-19 NOTE — Patient Instructions (Addendum)
Avoid bending, stooping and avoid lifting weights greater than 10 lbs. Avoid prolong standing and walking. Avoid frequent bending and stooping  No lifting greater than 10 lbs. May use ice or moist heat for pain. Weight loss is of benefit. Handicap license is approved. Dr. Romona Curls secretary/Assistant will call to arrange for epidural steroid injection   Stop gabapentin as it is not helping. Try elavil 10 mg po qhs Meloxicam 15 mg po qAM

## 2021-04-19 NOTE — Progress Notes (Addendum)
Office Visit Note   Patient: Jesse [name]           Date of Birth: Mar 20, 1941           MRN: [id number] Visit Date: 04/19/2021              Requested by: Wenda Low, MD [facility] [employer] [place] [address],  [place] [address] PCP: Wenda Low, MD   Assessment & Plan: Visit Diagnoses:  1. Low back pain, unspecified back pain laterality, unspecified chronicity, unspecified whether sciatica present   2. Chronic cervical radiculopathy   3. Cervical spondylosis   4. Other secondary scoliosis, lumbar region   5. Other spondylosis with radiculopathy, lumbar region     Plan: Follow-Avoid bending, stooping and avoid lifting weights greater than 10 lbs. Avoid prolong standing and walking. Avoid frequent bending and stooping  No lifting greater than 10 lbs. May use ice or moist heat for pain. Weight loss is of benefit. Handicap license is approved. Dr. Romona Curls secretary/Assistant will call to arrange for epidural steroid injection Up Instructions: Return in about 3 weeks (around 05/10/2021).  Stop gabapentin as it is not helping. Try elavil 10 mg po qhs Meloxicam 15 mg po qAM Orders:  Orders Placed This Encounter  Procedures   XR Lumbar Spine 2-3 Views   No orders of the defined types were placed in this encounter.     Procedures: No procedures performed   Clinical Data: No additional findings.   Subjective: Chief Complaint  Patient presents with   Lower Back - Pain    80 year old male with history of back left buttock and left lateral calf pain and numbness and tingling. He report pain is worse with standing and walking and improves with stooping, bending and sitting. No bowel difficulty, sees urology, Dr. Gloriann [facility] for prostate issues. No incontinence. He has been experiencing pain and numbness for years into the left leg and there is a noticeable difference in size of the left calf vs the right. Has been given a left shoe insert to make up a difference in  leg lengths, this is perhaps an Eighth of an inch. Seen by podiatry and the podiatrist evaluated him and an  MRI was done. He is able to grocery shop and shop in general. Does not desire surgery.    Review of Systems  Constitutional: Negative.   HENT: Negative.    Eyes: Negative.   Respiratory: Negative.    Cardiovascular: Negative.   Gastrointestinal:  Positive for nausea and vomiting (about 4-5 month ago a stomach upset).  Endocrine: Negative.   Genitourinary: Negative.   Musculoskeletal:  Positive for back pain.  Skin: Negative.  Negative for color change, pallor, rash and wound.  Allergic/Immunologic: Negative.   Neurological:  Positive for weakness and numbness. Negative for dizziness, tremors, seizures, syncope, facial asymmetry, speech difficulty, light-headedness and headaches.  Hematological: Negative.   Psychiatric/Behavioral: Negative.      Objective: Vital Signs: BP (!) 153/85 (BP Location: Left Arm, Patient Position: Sitting)   Pulse 97   Ht '5\' 6"'$  (1.676 m)   Wt 146 lb 1.6 oz (66.3 kg)   BMI 23.58 kg/m   Physical Exam Constitutional:      Appearance: He is well-developed.  HENT:     Head: Normocephalic and atraumatic.  Eyes:     Pupils: Pupils are equal, round, and reactive to light.  Pulmonary:     Effort: Pulmonary effort is normal.     Breath sounds:  Normal breath sounds.  Abdominal:     General: Bowel sounds are normal.     Palpations: Abdomen is soft.  Musculoskeletal:     Cervical back: Normal range of motion and neck supple.     Lumbar back: Negative right straight leg raise test and negative left straight leg raise test.  Skin:    General: Skin is warm and dry.  Neurological:     Mental Status: He is alert and oriented to person, place, and time.  Psychiatric:        Behavior: Behavior normal.        Thought Content: Thought content normal.        Judgment: Judgment normal.    Back Exam   Tenderness  The patient is experiencing  tenderness in the lumbar.  Range of Motion  Extension:  abnormal  Flexion:  normal  Lateral bend right:  normal  Lateral bend left:  normal  Rotation right:  normal  Rotation left:  normal   Muscle Strength  Right Quadriceps:  5/5  Left Quadriceps:  5/5  Right Hamstrings:  5/5  Left Hamstrings:  5/5   Tests  Straight leg raise right: negative Straight leg raise left: negative  Reflexes  Patellar:  0/4 Achilles:  0/4 Babinski's sign: normal   Other  Toe walk: abnormal Heel walk: abnormal Sensation: normal Gait: normal  Erythema: no back redness Scars: absent  Comments:  Left EHL 3/5 Left foot DF 4/5 Right foot DF and EHL is normal. Weak right Wrist VF and right finger extension Left calf circumference is 1/2 inch less than the right calf      Specialty Comments:  No specialty comments available.  Imaging: No results found.   PMFS History: Patient Active Problem List   Diagnosis Date Noted   UPJ obstruction, acquired 03/02/2021   Diverticulosis 03/02/2021   Cervical spondylosis 03/02/2021   Unspecified protein-calorie malnutrition ([place]) 03/02/2021   Transaminitis 03/02/2021   Hyponatremia 02/22/2021   Essential hypertension 02/22/2021   Hyperlipidemia 02/22/2021   Fall at home, subsequent encounter 02/22/2021   Need for prophylactic vaccination and inoculation against influenza 06/17/2016   Polycythemia rubra vera ([facility] [place]) 09/02/2011   HZ (herpes zoster) 09/02/2011   Past Medical History:  Diagnosis Date   Depression    Hypercholesteremia    Hypertension    HZ (herpes zoster) 09/02/2011   Neuritis    left foot   Polycythemia rubra vera ([place]) 09/02/2011    Family History  Problem Relation Age of Onset   Heart attack Mother     Past Surgical History:  Procedure Laterality Date   GAS INSERTION  06/04/2012   Procedure: INSERTION OF GAS;  Surgeon: Hayden Pedro, MD;  Location: [facility];  Service: Ophthalmology;  Laterality: Left;  [id number]   SCLERAL  BUCKLE  06/04/2012   Procedure: SCLERAL BUCKLE;  Surgeon: Hayden Pedro, MD;  Location: [facility];  Service: Ophthalmology;  Laterality: Left;  Headscope laser   Social History   Occupational History    Comment: retired  Tobacco Use   Smoking status: Never   Smokeless tobacco: Never   Tobacco comments:    never used tobacco  Vaping Use   Vaping Use: Never used  Substance and Sexual Activity   Alcohol use: No    Alcohol/week: 0.0 standard drinks   Drug use: No   Sexual activity: Not Currently

## 2021-04-20 ENCOUNTER — Telehealth: Payer: Self-pay | Admitting: Specialist

## 2021-04-20 DIAGNOSIS — Z9181 History of falling: Secondary | ICD-10-CM | POA: Diagnosis not present

## 2021-04-20 DIAGNOSIS — E7800 Pure hypercholesterolemia, unspecified: Secondary | ICD-10-CM | POA: Diagnosis not present

## 2021-04-20 DIAGNOSIS — I10 Essential (primary) hypertension: Secondary | ICD-10-CM | POA: Diagnosis not present

## 2021-04-20 DIAGNOSIS — Z7982 Long term (current) use of aspirin: Secondary | ICD-10-CM | POA: Diagnosis not present

## 2021-04-20 DIAGNOSIS — D45 Polycythemia vera: Secondary | ICD-10-CM | POA: Diagnosis not present

## 2021-04-20 DIAGNOSIS — M792 Neuralgia and neuritis, unspecified: Secondary | ICD-10-CM | POA: Diagnosis not present

## 2021-04-20 DIAGNOSIS — F32A Depression, unspecified: Secondary | ICD-10-CM | POA: Diagnosis not present

## 2021-04-20 DIAGNOSIS — E871 Hypo-osmolality and hyponatremia: Secondary | ICD-10-CM | POA: Diagnosis not present

## 2021-04-20 NOTE — Telephone Encounter (Signed)
Spoke with patient he asked if she should take the Xanax along with the Amitriptyline? The number to contact patient is [phone]

## 2021-04-20 NOTE — Telephone Encounter (Signed)
I called and advised to take one or the other but not both, he states that he understands that.

## 2021-04-23 ENCOUNTER — Other Ambulatory Visit: Payer: Self-pay | Admitting: Adult Health

## 2021-04-23 DIAGNOSIS — I10 Essential (primary) hypertension: Secondary | ICD-10-CM

## 2021-04-24 DIAGNOSIS — Z7982 Long term (current) use of aspirin: Secondary | ICD-10-CM | POA: Diagnosis not present

## 2021-04-24 DIAGNOSIS — M792 Neuralgia and neuritis, unspecified: Secondary | ICD-10-CM | POA: Diagnosis not present

## 2021-04-24 DIAGNOSIS — Z9181 History of falling: Secondary | ICD-10-CM | POA: Diagnosis not present

## 2021-04-24 DIAGNOSIS — I10 Essential (primary) hypertension: Secondary | ICD-10-CM | POA: Diagnosis not present

## 2021-04-24 DIAGNOSIS — F32A Depression, unspecified: Secondary | ICD-10-CM | POA: Diagnosis not present

## 2021-04-24 DIAGNOSIS — E7800 Pure hypercholesterolemia, unspecified: Secondary | ICD-10-CM | POA: Diagnosis not present

## 2021-04-24 DIAGNOSIS — E871 Hypo-osmolality and hyponatremia: Secondary | ICD-10-CM | POA: Diagnosis not present

## 2021-04-24 DIAGNOSIS — D45 Polycythemia vera: Secondary | ICD-10-CM | POA: Diagnosis not present

## 2021-04-30 DIAGNOSIS — F32A Depression, unspecified: Secondary | ICD-10-CM | POA: Diagnosis not present

## 2021-04-30 DIAGNOSIS — D45 Polycythemia vera: Secondary | ICD-10-CM | POA: Diagnosis not present

## 2021-04-30 DIAGNOSIS — E7800 Pure hypercholesterolemia, unspecified: Secondary | ICD-10-CM | POA: Diagnosis not present

## 2021-04-30 DIAGNOSIS — E871 Hypo-osmolality and hyponatremia: Secondary | ICD-10-CM | POA: Diagnosis not present

## 2021-04-30 DIAGNOSIS — Z7982 Long term (current) use of aspirin: Secondary | ICD-10-CM | POA: Diagnosis not present

## 2021-04-30 DIAGNOSIS — I10 Essential (primary) hypertension: Secondary | ICD-10-CM | POA: Diagnosis not present

## 2021-04-30 DIAGNOSIS — Z9181 History of falling: Secondary | ICD-10-CM | POA: Diagnosis not present

## 2021-04-30 DIAGNOSIS — M792 Neuralgia and neuritis, unspecified: Secondary | ICD-10-CM | POA: Diagnosis not present

## 2021-05-01 ENCOUNTER — Other Ambulatory Visit: Payer: Self-pay

## 2021-05-01 ENCOUNTER — Encounter: Payer: Self-pay | Admitting: Physical Medicine and Rehabilitation

## 2021-05-01 ENCOUNTER — Ambulatory Visit: Payer: Self-pay

## 2021-05-01 ENCOUNTER — Ambulatory Visit: Payer: Medicare Other | Admitting: Physical Medicine and Rehabilitation

## 2021-05-01 VITALS — BP 133/79 | HR 98

## 2021-05-01 DIAGNOSIS — M5416 Radiculopathy, lumbar region: Secondary | ICD-10-CM | POA: Diagnosis not present

## 2021-05-01 MED ORDER — METHYLPREDNISOLONE ACETATE 80 MG/ML IJ SUSP
80.0000 mg | Freq: Once | INTRAMUSCULAR | Status: AC
  Administered 2021-05-01: 80 mg

## 2021-05-01 NOTE — Progress Notes (Signed)
Pt state lower back pain that travels down his left leg to his foot. Pt state walking for a long period of time makes the pain worse. Pt state he takes pain meds to help ease his pain.  Numeric Pain Rating Scale and Functional Assessment Average Pain 3   In the last MONTH (on 0-10 scale) has pain interfered with the following?  1. General activity like being  able to carry out your everyday physical activities such as walking, climbing stairs, carrying groceries, or moving a chair?  Rating(4)   +Driver, -BT, -Dye Allergies.

## 2021-05-01 NOTE — Patient Instructions (Signed)

## 2021-05-11 ENCOUNTER — Other Ambulatory Visit: Payer: Self-pay | Admitting: Specialist

## 2021-05-15 DIAGNOSIS — I10 Essential (primary) hypertension: Secondary | ICD-10-CM | POA: Diagnosis not present

## 2021-05-15 DIAGNOSIS — G4700 Insomnia, unspecified: Secondary | ICD-10-CM | POA: Diagnosis not present

## 2021-05-15 DIAGNOSIS — E7800 Pure hypercholesterolemia, unspecified: Secondary | ICD-10-CM | POA: Diagnosis not present

## 2021-05-22 NOTE — Procedures (Signed)
Lumbosacral Transforaminal Epidural Steroid Injection - Sub-Pedicular Approach with Fluoroscopic Guidance  Patient: Jesse [name]      Date of Birth: 03-Sep-1941 MRN: [id number] PCP: Wenda Low, MD      Visit Date: 05/01/2021   Universal Protocol:    Date/Time: 05/01/2021  Consent Given By: the patient  Position: PRONE  Additional Comments: Vital signs were monitored before and after the procedure. Patient was prepped and draped in the usual sterile fashion. The correct patient, procedure, and site was verified.   Injection Procedure Details:   Procedure diagnoses: Lumbar radiculopathy [M54.16]    Meds Administered:  Meds ordered this encounter  Medications   methylPREDNISolone acetate (DEPO-MEDROL) injection 80 mg    Laterality: Left  Location/Site:  L4-L5  Needle:5.0 in., 22 ga.  Short bevel or Quincke spinal needle  Needle Placement: Transforaminal  Findings:    -Comments: Excellent flow of contrast along the nerve, nerve root and into the epidural space.  Procedure Details: After squaring off the end-plates to get a true AP view, the C-arm was positioned so that an oblique view of the foramen as noted above was visualized. The target area is just inferior to the "nose of the scotty dog" or sub pedicular. The soft tissues overlying this structure were infiltrated with 2-3 ml. of 1% Lidocaine without Epinephrine.  The spinal needle was inserted toward the target using a "trajectory" view along the fluoroscope beam.  Under AP and lateral visualization, the needle was advanced so it did not puncture dura and was located close the 6 O'Clock position of the pedical in AP tracterory. Biplanar projections were used to confirm position. Aspiration was confirmed to be negative for CSF and/or blood. A 1-2 ml. volume of Isovue-250 was injected and flow of contrast was noted at each level. Radiographs were obtained for documentation purposes.   After attaining the desired  flow of contrast documented above, a 0.5 to 1.0 ml test dose of 0.25% Marcaine was injected into each respective transforaminal space.  The patient was observed for 90 seconds post injection.  After no sensory deficits were reported, and normal lower extremity motor function was noted,   the above injectate was administered so that equal amounts of the injectate were placed at each foramen (level) into the transforaminal epidural space.   Additional Comments:  The patient tolerated the procedure well Dressing: 2 x 2 sterile gauze and Band-Aid    Post-procedure details: Patient was observed during the procedure. Post-procedure instructions were reviewed.  Patient left the clinic in stable condition.

## 2021-05-22 NOTE — Progress Notes (Signed)
Jesse [name] - 80 y.o. male MRN [id number]  Date of birth: 1941/04/23  Office Visit Note: Visit Date: 05/01/2021 PCP: Wenda Low, MD Referred by: Wenda Low, MD  Subjective: Chief Complaint  Patient presents with   Lower Back - Pain   Left Leg - Pain   Left Foot - Pain   HPI:  Jesse [name] is a 80 y.o. male who comes in today at the request of Dr. Basil Dess for planned Left L4-L5 and L5-S1 Lumbar Transforaminal epidural steroid injection with fluoroscopic guidance.  The patient has failed conservative care including home exercise, medications, time and activity modification.  This injection will be diagnostic and hopefully therapeutic.  Please see requesting physician notes for further details and justification.   ROS Otherwise per HPI.  Assessment & Plan: Visit Diagnoses:    ICD-10-CM   1. Lumbar radiculopathy  M54.16 XR C-ARM NO REPORT    Epidural Steroid injection    methylPREDNISolone acetate (DEPO-MEDROL) injection 80 mg      Plan: No additional findings.   Meds & Orders:  Meds ordered this encounter  Medications   methylPREDNISolone acetate (DEPO-MEDROL) injection 80 mg    Orders Placed This Encounter  Procedures   XR C-ARM NO REPORT   Epidural Steroid injection    Follow-up: Return for visit to requesting physician as needed.   Procedures: No procedures performed  Lumbosacral Transforaminal Epidural Steroid Injection - Sub-Pedicular Approach with Fluoroscopic Guidance  Patient: Jesse [name]      Date of Birth: 1941/08/30 MRN: [id number] PCP: Wenda Low, MD      Visit Date: 05/01/2021   Universal Protocol:    Date/Time: 05/01/2021  Consent Given By: the patient  Position: PRONE  Additional Comments: Vital signs were monitored before and after the procedure. Patient was prepped and draped in the usual sterile fashion. The correct patient, procedure, and site was verified.   Injection Procedure Details:   Procedure diagnoses:  Lumbar radiculopathy [M54.16]    Meds Administered:  Meds ordered this encounter  Medications   methylPREDNISolone acetate (DEPO-MEDROL) injection 80 mg    Laterality: Left  Location/Site:  L4-L5  Needle:5.0 in., 22 ga.  Short bevel or Quincke spinal needle  Needle Placement: Transforaminal  Findings:    -Comments: Excellent flow of contrast along the nerve, nerve root and into the epidural space.  Procedure Details: After squaring off the end-plates to get a true AP view, the C-arm was positioned so that an oblique view of the foramen as noted above was visualized. The target area is just inferior to the "nose of the scotty dog" or sub pedicular. The soft tissues overlying this structure were infiltrated with 2-3 ml. of 1% Lidocaine without Epinephrine.  The spinal needle was inserted toward the target using a "trajectory" view along the fluoroscope beam.  Under AP and lateral visualization, the needle was advanced so it did not puncture dura and was located close the 6 O'Clock position of the pedical in AP tracterory. Biplanar projections were used to confirm position. Aspiration was confirmed to be negative for CSF and/or blood. A 1-2 ml. volume of Isovue-250 was injected and flow of contrast was noted at each level. Radiographs were obtained for documentation purposes.   After attaining the desired flow of contrast documented above, a 0.5 to 1.0 ml test dose of 0.25% Marcaine was injected into each respective transforaminal space.  The patient was observed for 90 seconds post injection.  After no sensory deficits were reported, and  normal lower extremity motor function was noted,   the above injectate was administered so that equal amounts of the injectate were placed at each foramen (level) into the transforaminal epidural space.   Additional Comments:  The patient tolerated the procedure well Dressing: 2 x 2 sterile gauze and Band-Aid    Post-procedure details: Patient was  observed during the procedure. Post-procedure instructions were reviewed.  Patient left the clinic in stable condition.    Clinical History: NEUROIMAGING REPORT       STUDY DATE: 07/14/2020 PATIENT NAME: Jesse [name] DOB: 10/06/1940 MRN: [id number]   EXAM: MRI of the lumbar spine without contrast   ORDERING CLINICIAN: Andrey Spearman MD CLINICAL HISTORY: 80 year old man with left lumbar radiculopathy COMPARISON FILMS: None   TECHNIQUE: MRI of the lumbar spine was obtained utilizing 4 mm sagittal slices from 0000000 down to the lower sacrum with T1, T2 and inversion recovery views. In addition 4 mm axial slices from [id number] down to L5-S1 level were included with T1 and T2 weighted views. CONTRAST: None IMAGING SITE: [facility], [address] [place], [place], [place]     FINDINGS: On sagittal images, the spine is imaged from T11 to the sacrum.   The conus medullaris and cauda equine appear normal.   There is 1 to 2 mm of retrolisthesis of T12 upon L1, 1 mm retrolisthesis of L1 upon L2, 2 mm retrolisthesis of L2 upon 3 and 1 mm retrolisthesis of L4 upon L5.  There is mild scoliosis, convex to the left.  There is reduced disc height at all of the lumbar levels, most pronounced at L2-L3.  Endplate degenerative changes are noted at L1-L2, L2-L3 and L3-L4.  A Schmorl's node enters the inferior endplate of L1.   The discs and interspaces were further evaluated on axial views from L1 to S1 as follows:   T12-L1: There is minimal retrolisthesis, minimal disc protrusion to the left and endplate spurring.  There is mild left foraminal stenosis but no nerve root compression or spinal stenosis.   L1-L2: There is minimal retrolisthesis, disc bulging.  There is mild lateral recess stenosis but no nerve root compression or spinal stenosis.   L2-L3: There is 2 mm retrolisthesis associated with disc protrusion and endplate spurring.  There is mild foraminal narrowing, mild left lateral recess  stenosis and moderate right lateral recess stenosis.  There is no nerve root compression or spinal stenosis.   L3-L4: There is disc bulging, endplate spurring, mild facet hypertrophy combining to cause moderate bilateral foraminal narrowing and moderate bilateral lateral recess stenosis.  There is no definite nerve root compression though the degenerative changes encroach upon the exiting and traversing nerve roots.   L4-L5: There is minimal retrolisthesis, disc bulging, endplate spurring and moderate ligamenta flava hypertrophy causing moderately severe left foraminal narrowing and severe left lateral recess stenosis.  There is also mild right foraminal narrowing and lateral recess stenosis.  There is potential for left L4 and left L5 nerve root compression.   L5-S1: There is a right paramedian disc protrusion and endplate spurring causing moderate right foraminal narrowing, moderately severe right lateral recess stenosis moderate left foraminal narrowing and moderate left lateral recess stenosis.  There is potential for right S1 nerve root compression.     IMPRESSION: This MRI of the lumbar spine without contrast shows the following: 1.    There is minimal to mild retrolisthesis of T12-L1, L1-L2, L2-L3 and L4-L5. 2.    At L4-L5, degenerative changes cause moderately severe left foraminal  narrowing and severe left lateral recess stenosis with potential for left L4 and left L5 nerve root compression. 3.    At L5-S1, degenerative changes cause moderately severe right lateral recess stenosis with potential for right S1 nerve root compression. 4.    There are milder degenerative changes at the other lumbar levels that do not lead to nerve root compression or spinal stenosis. 5.    Mild scoliosis, convex to the left.         INTERPRETING PHYSICIAN: Richard A. Felecia Shelling, MD, PhD, Charlynn Grimes     Objective:  VS:  HT:    WT:   BMI:     BP:133/79  HR:98bpm  TEMP: ( )  RESP:  Physical Exam Vitals and  nursing note reviewed.  Constitutional:      General: He is not in acute distress.    Appearance: Normal appearance. He is not ill-appearing.  HENT:     Head: Normocephalic and atraumatic.     Right Ear: External ear normal.     Left Ear: External ear normal.     Nose: No congestion.  Eyes:     Extraocular Movements: Extraocular movements intact.  Cardiovascular:     Rate and Rhythm: Normal rate.     Pulses: Normal pulses.  Pulmonary:     Effort: Pulmonary effort is normal. No respiratory distress.  Abdominal:     General: There is no distension.     Palpations: Abdomen is soft.  Musculoskeletal:        General: No tenderness or signs of injury.     Cervical back: Neck supple.     Right lower leg: No edema.     Left lower leg: No edema.     Comments: Patient has good distal strength without clonus.  Skin:    Findings: No erythema or rash.  Neurological:     General: No focal deficit present.     Mental Status: He is alert and oriented to person, place, and time.     Sensory: No sensory deficit.     Motor: No weakness or abnormal muscle tone.     Coordination: Coordination normal.  Psychiatric:        Mood and Affect: Mood normal.        Behavior: Behavior normal.     Imaging: No results found.

## 2021-05-29 DIAGNOSIS — Z0000 Encounter for general adult medical examination without abnormal findings: Secondary | ICD-10-CM | POA: Diagnosis not present

## 2021-05-29 DIAGNOSIS — I700 Atherosclerosis of aorta: Secondary | ICD-10-CM | POA: Diagnosis not present

## 2021-05-29 DIAGNOSIS — E559 Vitamin D deficiency, unspecified: Secondary | ICD-10-CM | POA: Diagnosis not present

## 2021-05-29 DIAGNOSIS — E871 Hypo-osmolality and hyponatremia: Secondary | ICD-10-CM | POA: Diagnosis not present

## 2021-05-29 DIAGNOSIS — D45 Polycythemia vera: Secondary | ICD-10-CM | POA: Diagnosis not present

## 2021-05-29 DIAGNOSIS — I10 Essential (primary) hypertension: Secondary | ICD-10-CM | POA: Diagnosis not present

## 2021-05-29 DIAGNOSIS — M519 Unspecified thoracic, thoracolumbar and lumbosacral intervertebral disc disorder: Secondary | ICD-10-CM | POA: Diagnosis not present

## 2021-05-29 DIAGNOSIS — Z1389 Encounter for screening for other disorder: Secondary | ICD-10-CM | POA: Diagnosis not present

## 2021-05-29 DIAGNOSIS — Q6100 Congenital renal cyst, unspecified: Secondary | ICD-10-CM | POA: Diagnosis not present

## 2021-05-29 DIAGNOSIS — E538 Deficiency of other specified B group vitamins: Secondary | ICD-10-CM | POA: Diagnosis not present

## 2021-05-29 DIAGNOSIS — E7800 Pure hypercholesterolemia, unspecified: Secondary | ICD-10-CM | POA: Diagnosis not present

## 2021-05-30 ENCOUNTER — Other Ambulatory Visit: Payer: Self-pay

## 2021-05-30 ENCOUNTER — Inpatient Hospital Stay: Payer: Medicare Other | Admitting: Family

## 2021-05-30 ENCOUNTER — Inpatient Hospital Stay: Payer: Medicare Other

## 2021-05-30 ENCOUNTER — Inpatient Hospital Stay: Payer: Medicare Other | Attending: Family

## 2021-05-30 ENCOUNTER — Encounter: Payer: Self-pay | Admitting: Family

## 2021-05-30 VITALS — BP 119/66

## 2021-05-30 VITALS — BP 116/75 | HR 90 | Temp 98.2°F | Resp 17 | Ht 66.0 in | Wt 147.1 lb

## 2021-05-30 DIAGNOSIS — D500 Iron deficiency anemia secondary to blood loss (chronic): Secondary | ICD-10-CM | POA: Diagnosis not present

## 2021-05-30 DIAGNOSIS — R5383 Other fatigue: Secondary | ICD-10-CM | POA: Insufficient documentation

## 2021-05-30 DIAGNOSIS — D751 Secondary polycythemia: Secondary | ICD-10-CM | POA: Insufficient documentation

## 2021-05-30 DIAGNOSIS — Z7952 Long term (current) use of systemic steroids: Secondary | ICD-10-CM | POA: Diagnosis not present

## 2021-05-30 DIAGNOSIS — R202 Paresthesia of skin: Secondary | ICD-10-CM | POA: Insufficient documentation

## 2021-05-30 DIAGNOSIS — D45 Polycythemia vera: Secondary | ICD-10-CM

## 2021-05-30 LAB — CBC WITH DIFFERENTIAL (CANCER CENTER ONLY)
Abs Immature Granulocytes: 0.06 10*3/uL (ref 0.00–0.07)
Basophils Absolute: 0 10*3/uL (ref 0.0–0.1)
Basophils Relative: 1 %
Eosinophils Absolute: 0.1 10*3/uL (ref 0.0–0.5)
Eosinophils Relative: 1 %
HCT: 47.1 % (ref 39.0–52.0)
Hemoglobin: 16 g/dL (ref 13.0–17.0)
Immature Granulocytes: 1 %
Lymphocytes Relative: 24 %
Lymphs Abs: 1.8 10*3/uL (ref 0.7–4.0)
MCH: 31.7 pg (ref 26.0–34.0)
MCHC: 34 g/dL (ref 30.0–36.0)
MCV: 93.3 fL (ref 80.0–100.0)
Monocytes Absolute: 1 10*3/uL (ref 0.1–1.0)
Monocytes Relative: 13 %
Neutro Abs: 4.5 10*3/uL (ref 1.7–7.7)
Neutrophils Relative %: 60 %
Platelet Count: 213 10*3/uL (ref 150–400)
RBC: 5.05 MIL/uL (ref 4.22–5.81)
RDW: 15.9 % — ABNORMAL HIGH (ref 11.5–15.5)
WBC Count: 7.5 10*3/uL (ref 4.0–10.5)
nRBC: 0 % (ref 0.0–0.2)

## 2021-05-30 LAB — IRON AND TIBC
Iron: 112 ug/dL (ref 42–163)
Saturation Ratios: 33 % (ref 20–55)
TIBC: 344 ug/dL (ref 202–409)
UIBC: 232 ug/dL (ref 117–376)

## 2021-05-30 LAB — RETICULOCYTES
Immature Retic Fract: 8.6 % (ref 2.3–15.9)
RBC.: 5.06 MIL/uL (ref 4.22–5.81)
Retic Count, Absolute: 78.9 10*3/uL (ref 19.0–186.0)
Retic Ct Pct: 1.6 % (ref 0.4–3.1)

## 2021-05-30 LAB — FERRITIN: Ferritin: 17 ng/mL — ABNORMAL LOW (ref 24–336)

## 2021-05-30 NOTE — Progress Notes (Signed)
Hematology and Oncology Follow Up Visit  Jesse [name] [id number] 18-Sep-1941 80 y.o. 05/30/2021   Principle Diagnosis:  Polycythemia - JAK2 negative   Current Therapy:        Phlebotomy to maintain hematocrit below 45% Aspirin 81 mg by mouth daily   Interim History:  [name] is here today for follow-up and phlebotomy. Hct is 47.1%.   He is doing well but notes some occasional fatigue.  He completed PT and is so glad to be home from the [facility].  He follows up with Dr. Louanne Skye tomorrow. He got a steroid injection in her lower back last month and felt that this has helped.  No fever, chills, n/v, cough, rash, dizziness, SOB, chest pain, palpitations, abdominal pain or changes in bowel or bladder habits.  No blood loss noted. No bruising or petechiae.  No swelling or numbness in his extremities. He has tenderness and tingling at times in his hands, knees and feet due to arthritis. This comes and goes.  No new falls or syncope to report.  He has maintained a good appetite and is staying well hydrated. His weight is stable at 147 lbs.   ECOG Performance Status: 1 - Symptomatic but completely ambulatory  Medications:  Allergies as of 05/30/2021       Reactions   Hctz [hydrochlorothiazide] Other (See Comments)   Hyponatremia ; Na+ 111, w weakness resulting in mechanical fall   Tramadol Nausea And Vomiting        Medication List        Accurate as of May 30, 2021 11:09 AM. If you have any questions, ask your nurse or doctor.          ALPRAZolam 1 MG tablet Commonly known as: XANAX Take 0.5 mg by mouth at bedtime.   amitriptyline 10 MG tablet Commonly known as: ELAVIL TAKE 1 TABLET BY MOUTH EVERYDAY AT BEDTIME   aspirin EC 81 MG tablet Take 81 mg by mouth daily.   bisacodyl 10 MG suppository Commonly known as: DULCOLAX Place 10 mg rectally as needed for moderate constipation.   fish oil-omega-3 fatty acids 1000 MG capsule Take 1 g by mouth daily.   lisinopril 20  MG tablet Commonly known as: ZESTRIL Take 1 tablet (20 mg total) by mouth daily.   meloxicam 15 MG tablet Commonly known as: MOBIC Take 1 tablet (15 mg total) by mouth daily.   simvastatin 20 MG tablet Commonly known as: ZOCOR Take 1 tablet (20 mg total) by mouth daily at 6 PM.   tamsulosin 0.4 MG Caps capsule Commonly known as: FLOMAX Take 0.4 mg by mouth daily.   vitamin E 1000 UNIT capsule Take 1,000 Units by mouth daily.        Allergies:  Allergies  Allergen Reactions   Hctz [Hydrochlorothiazide] Other (See Comments)    Hyponatremia ; Na+ 111, w weakness resulting in mechanical fall   Tramadol Nausea And Vomiting    Past Medical History, Surgical history, Social history, and Family History were reviewed and updated.  Review of Systems: All other 10 point review of systems is negative.   Physical Exam:  height is '5\' 6"'$  (1.676 m) and weight is 147 lb 1.3 oz (66.7 kg). His oral temperature is 98.2 F (36.8 C). His blood pressure is 116/75 and his pulse is 90. His respiration is 17 and oxygen saturation is 99%.   Wt Readings from Last 3 Encounters:  05/30/21 147 lb 1.3 oz (66.7 kg)  04/19/21 146 lb 1.6  oz (66.3 kg)  04/13/21 146 lb 1.9 oz (66.3 kg)    Ocular: Sclerae unicteric, pupils equal, round and reactive to light Ear-nose-throat: Oropharynx clear, dentition fair Lymphatic: No cervical or supraclavicular adenopathy Lungs no rales or rhonchi, good excursion bilaterally Heart regular rate and rhythm, no murmur appreciated Abd soft, nontender, positive bowel sounds MSK no focal spinal tenderness, no joint edema Neuro: non-focal, well-oriented, appropriate affect Breasts: Deferred   Lab Results  Component Value Date   WBC 7.5 05/30/2021   HGB 16.0 05/30/2021   HCT 47.1 05/30/2021   MCV 93.3 05/30/2021   PLT 213 05/30/2021   Lab Results  Component Value Date   FERRITIN 27 04/13/2021   IRON 93 04/13/2021   TIBC 344 04/13/2021   UIBC 251 04/13/2021    IRONPCTSAT 27 04/13/2021   Lab Results  Component Value Date   RETICCTPCT 1.6 05/30/2021   RBC 5.05 05/30/2021   RETICCTABS 87.5 01/02/2011   No results found for: KPAFRELGTCHN, LAMBDASER, KAPLAMBRATIO No results found for: IGGSERUM, IGA, IGMSERUM No results found for: Odetta Pink, SPEI   Chemistry      Component Value Date/Time   NA 138 04/13/2021 1424   NA 136 08/19/2017 1149   NA 135 (L) 10/21/2016 1151   K 3.5 04/13/2021 1424   K 3.8 08/19/2017 1149   K 3.9 10/21/2016 1151   CL 103 04/13/2021 1424   CL 95 (L) 08/19/2017 1149   CO2 26 04/13/2021 1424   CO2 28 08/19/2017 1149   CO2 25 10/21/2016 1151   BUN 13 04/13/2021 1424   BUN 14 08/19/2017 1149   BUN 13.6 10/21/2016 1151   CREATININE 0.79 04/13/2021 1424   CREATININE 1.0 08/19/2017 1149   CREATININE 0.8 10/21/2016 1151      Component Value Date/Time   CALCIUM 10.3 04/13/2021 1424   CALCIUM 10.1 08/19/2017 1149   CALCIUM 10.3 10/21/2016 1151   ALKPHOS 59 04/13/2021 1424   ALKPHOS 68 08/19/2017 1149   ALKPHOS 68 10/21/2016 1151   AST 14 (L) 04/13/2021 1424   AST 25 10/21/2016 1151   ALT 12 04/13/2021 1424   ALT 32 08/19/2017 1149   ALT 38 10/21/2016 1151   BILITOT 1.3 (H) 04/13/2021 1424   BILITOT 1.22 (H) 10/21/2016 1151       Impression and Plan: Jesse [name] is a very pleasant 80 yo caucasian gentleman with polycythemia, JAK-2 negative.  We will proceed with phlebotomy for Hct 47.1%.  Follow-up in 8 weeks.  He can contact our office with any questions or concerns.   Laverna Peace, NP 9/7/202211:09 AM

## 2021-05-30 NOTE — Patient Instructions (Signed)
Therapeutic Phlebotomy Therapeutic phlebotomy is the planned removal of blood from a person's body for the purpose of treating a medical condition. The procedure is similar to donating blood. Usually, about a pint (470 mL, or 0.47 L) of blood is removed. The average adult has 9-12 pints (4.3-5.7 L) of blood in the body. Therapeutic phlebotomy may be used to treat the following medical conditions: Hemochromatosis. This is a condition in which the blood contains too much iron. Polycythemia vera. This is a condition in which the blood contains too many red blood cells. Porphyria cutanea tarda. This is a disease in which an important part of hemoglobin is not made properly. It results in the buildup of abnormal amounts of porphyrins in the body. Sickle cell disease. This is a condition in which the red blood cells form an abnormal crescent shape rather than a round shape. Tell a health care provider about: Any allergies you have. All medicines you are taking, including vitamins, herbs, eye drops, creams, and over-the-counter medicines. Any problems you or family members have had with anesthetic medicines. Any blood disorders you have. Any surgeries you have had. Any medical conditions you have. Whether you are pregnant or may be pregnant. What are the risks? Generally, this is a safe procedure. However, problems may occur, including: Nausea or light-headedness. Low blood pressure (hypotension). Soreness, bleeding, swelling, or bruising at the needle insertion site. Infection. What happens before the procedure? Follow instructions from your health care provider about eating or drinking restrictions. Ask your health care provider about: Changing or stopping your regular medicines. This is especially important if you are taking diabetes medicines or blood thinners (anticoagulants). Taking medicines such as aspirin and ibuprofen. These medicines can thin your blood. Do not take these medicines  unless your health care provider tells you to take them. Taking over-the-counter medicines, vitamins, herbs, and supplements. Wear clothing with sleeves that can be raised above the elbow. Plan to have someone take you home from the hospital or clinic. You may have a blood sample taken. Your blood pressure, pulse rate, and breathing rate will be measured. What happens during the procedure?  To lower your risk of infection: Your health care team will wash or sanitize their hands. Your skin will be cleaned with an antiseptic. You may be given a medicine to numb the area (local anesthetic). A tourniquet will be placed on your arm. A needle will be inserted into one of your veins. Tubing and a collection bag will be attached to that needle. Blood will flow through the needle and tubing into the collection bag. The collection bag will be placed lower than your arm to allow gravity to help the flow of blood into the bag. You may be asked to open and close your hand slowly and continually during the entire collection. After the specified amount of blood has been removed from your body, the collection bag and tubing will be clamped. The needle will be removed from your vein. Pressure will be held on the site of the needle insertion to stop the bleeding. A bandage (dressing) will be placed over the needle insertion site. The procedure may vary among health care providers and hospitals. What happens after the procedure? Your blood pressure, pulse rate, and breathing rate will be measured after the procedure. You will be encouraged to drink fluids. Your recovery will be assessed and monitored. You can return to your normal activities as told by your health care provider. Summary Therapeutic phlebotomy is the planned removal   of blood from a person's body for the purpose of treating a medical condition. Therapeutic phlebotomy may be used to treat hemochromatosis, polycythemia vera, porphyria cutanea  tarda, or sickle cell disease. In the procedure, a needle is inserted and about a pint (470 mL, or 0.47 L) of blood is removed. The average adult has 9-12 pints (4.3-5.7 L) of blood in the body. This is generally a safe procedure, but it can sometimes cause problems such as nausea, light-headedness, or low blood pressure (hypotension). This information is not intended to replace advice given to you by your health care provider. Make sure you discuss any questions you have with your health care provider. Document Revised: 12/19/2020 Document Reviewed: 09/25/2017 Elsevier Patient Education  2022 [employer]  

## 2021-05-30 NOTE — Progress Notes (Signed)
Angie Fava presents today for phlebotomy per MD orders. Phlebotomy procedure started at 1130 with 16 gauge phlebotomy kit and ended at 1146. 506 grams removed.  Refreshments given. Patient observed for 30 minutes after procedure without any incident. Patient tolerated procedure well. IV needle removed intact.

## 2021-05-31 ENCOUNTER — Ambulatory Visit: Payer: Medicare Other | Admitting: Specialist

## 2021-05-31 ENCOUNTER — Encounter: Payer: Self-pay | Admitting: Specialist

## 2021-05-31 ENCOUNTER — Telehealth: Payer: Self-pay | Admitting: *Deleted

## 2021-05-31 VITALS — BP 126/79 | HR 80 | Ht 66.0 in | Wt 147.0 lb

## 2021-05-31 DIAGNOSIS — M5412 Radiculopathy, cervical region: Secondary | ICD-10-CM

## 2021-05-31 DIAGNOSIS — M4726 Other spondylosis with radiculopathy, lumbar region: Secondary | ICD-10-CM

## 2021-05-31 DIAGNOSIS — M4156 Other secondary scoliosis, lumbar region: Secondary | ICD-10-CM

## 2021-05-31 DIAGNOSIS — M545 Low back pain, unspecified: Secondary | ICD-10-CM | POA: Diagnosis not present

## 2021-05-31 NOTE — Patient Instructions (Addendum)
Plan: Follow-Avoid bending, stooping and avoid lifting weights greater than 10 lbs. Avoid prolong standing and walking. Avoid frequent bending and stooping  No lifting greater than 10 lbs. May use ice or moist heat for pain. Weight loss is of benefit. Handicap license is approved. Left heel pad 1/4" inch HAAPAD can be ordered but are also sold online, some older pharmacies carry the HAAPAD brand of heel lift. Calf stretching helps. Meloxicam for arthritis pain. Take with food.  Elavil or amytriptyline 10 po at night to decrease pain and nerve irritation. Call if pain recurres and we would consider repeating the steroid injections.

## 2021-05-31 NOTE — Telephone Encounter (Signed)
Per 05/30/21 los - called and lvm of upcoming appointments - requested call back to confirm - mailed calendar

## 2021-05-31 NOTE — Progress Notes (Addendum)
Office Visit Note   Patient: Jesse [name]           Date of Birth: [birth year]           MRN: [id number] Visit Date: 05/31/2021              Requested by: Wenda Low, MD [address] [place],  [place] [address] PCP: Wenda Low, MD   Assessment & Plan: Visit Diagnoses:  1. Low back pain, unspecified back pain laterality, unspecified chronicity, unspecified whether sciatica present   2. Chronic cervical radiculopathy   3. Other spondylosis with radiculopathy, lumbar region   4. Other secondary scoliosis, lumbar region     Plan: Follow-Avoid bending, stooping and avoid lifting weights greater than 10 lbs. Avoid prolong standing and walking. Avoid frequent bending and stooping  No lifting greater than 10 lbs. May use ice or moist heat for pain. Weight loss is of benefit. Handicap license is approved.  Left heel pad 1/4" inch HAAPAD can be ordered but are also sold online, some older pharmacies carry the HAAPAD brand of heel lift. Calf stretching helps. Meloxicam for arthritis pain. Take with food.  Elavil or amytriptyline 10 po at night to decrease pain and nerve irritation.  Follow-Up Instructions: No follow-ups on file.   Orders:  No orders of the defined types were placed in this encounter.  No orders of the defined types were placed in this encounter.     Procedures: No procedures performed   Clinical Data: No additional findings.   Subjective: Chief Complaint  Patient presents with   Lower Back - Follow-up    Had a left L4-5 TF injection on 05/01/21 with Dr. Ernestina Patches, said it did pretty good, got 80% relief.     80 year old male with history of thoracolumbar scoliosis and spondylosis and underwent injection by Dr. Ernestina Patches for foramenal stenosis with good improvement in the left leg pain. Now considering returning to golfing.   Review of Systems  Constitutional: Negative.   HENT: Negative.    Eyes: Negative.   Respiratory: Negative.     Cardiovascular: Negative.   Gastrointestinal: Negative.   Endocrine: Negative.   Genitourinary: Negative.   Musculoskeletal: Negative.   Skin: Negative.   Allergic/Immunologic: Negative.   Neurological: Negative.   Hematological: Negative.   Psychiatric/Behavioral: Negative.      Objective: Vital Signs: BP 126/79 (BP Location: Left Arm, Patient Position: Sitting)   Pulse 80   Ht '5\' 6"'$  (1.676 m)   Wt 147 lb (66.7 kg)   BMI 23.73 kg/m   Physical Exam Constitutional:      Appearance: He is well-developed.  HENT:     Head: Normocephalic and atraumatic.  Eyes:     Pupils: Pupils are equal, round, and reactive to light.  Pulmonary:     Effort: Pulmonary effort is normal.     Breath sounds: Normal breath sounds.  Abdominal:     General: Bowel sounds are normal.     Palpations: Abdomen is soft.  Musculoskeletal:     Cervical back: Normal range of motion and neck supple.     Lumbar back: Negative right straight leg raise test and negative left straight leg raise test.  Skin:    General: Skin is warm and dry.  Neurological:     Mental Status: He is alert and oriented to person, place, and time.  Psychiatric:        Behavior: Behavior normal.  Thought Content: Thought content normal.        Judgment: Judgment normal.   Back Exam   Tenderness  The patient is experiencing tenderness in the lumbar.  Range of Motion  Extension:  abnormal  Flexion:  abnormal  Lateral bend right:  abnormal  Lateral bend left:  abnormal  Rotation right:  abnormal  Rotation left:  abnormal   Tests  Straight leg raise right: negative Straight leg raise left: negative    Specialty Comments:  No specialty comments available.  Imaging: No results found.   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   HZ (herpes zoster) 09/02/2011   Past Medical History:  Diagnosis Date   Depression    Hypercholesteremia    Hypertension    HZ (herpes zoster) 09/02/2011   Neuritis    left foot   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/02/2011    Family History  Problem Relation Age of Onset   Heart attack Mother     Past Surgical History:  Procedure Laterality Date   GAS INSERTION  06/04/2012   Procedure: INSERTION OF GAS;  Surgeon: Hayden Pedro, MD;  Location: [facility];  Service: Ophthalmology;  Laterality: Left;  C3F8   SCLERAL BUCKLE  06/04/2012   Procedure: SCLERAL BUCKLE;  Surgeon: Hayden Pedro, MD;  Location: [facility];  Service: Ophthalmology;  Laterality: Left;  Headscope laser   Social History   Occupational History    Comment: retired  Tobacco Use   Smoking status: Never   Smokeless tobacco: Never   Tobacco comments:    never used tobacco  Vaping Use   Vaping Use: Never used  Substance and Sexual Activity   Alcohol use: No    Alcohol/week: 0.0 standard drinks   Drug use: No   Sexual activity: Not Currently

## 2021-06-18 ENCOUNTER — Other Ambulatory Visit: Payer: Self-pay | Admitting: Adult Health

## 2021-06-18 DIAGNOSIS — I10 Essential (primary) hypertension: Secondary | ICD-10-CM

## 2021-07-09 ENCOUNTER — Other Ambulatory Visit: Payer: Self-pay | Admitting: Specialist

## 2021-07-25 ENCOUNTER — Telehealth: Payer: Self-pay | Admitting: *Deleted

## 2021-07-25 ENCOUNTER — Encounter: Payer: Self-pay | Admitting: Family

## 2021-07-25 ENCOUNTER — Other Ambulatory Visit: Payer: Self-pay

## 2021-07-25 ENCOUNTER — Inpatient Hospital Stay: Payer: Medicare Other

## 2021-07-25 ENCOUNTER — Other Ambulatory Visit (HOSPITAL_BASED_OUTPATIENT_CLINIC_OR_DEPARTMENT_OTHER): Payer: Self-pay

## 2021-07-25 ENCOUNTER — Inpatient Hospital Stay: Payer: Medicare Other | Attending: Family

## 2021-07-25 ENCOUNTER — Inpatient Hospital Stay (HOSPITAL_BASED_OUTPATIENT_CLINIC_OR_DEPARTMENT_OTHER): Payer: Medicare Other | Admitting: Family

## 2021-07-25 VITALS — BP 128/68 | HR 96

## 2021-07-25 VITALS — BP 128/73 | HR 78 | Temp 97.9°F | Resp 17 | Wt 150.8 lb

## 2021-07-25 DIAGNOSIS — D751 Secondary polycythemia: Secondary | ICD-10-CM

## 2021-07-25 DIAGNOSIS — D500 Iron deficiency anemia secondary to blood loss (chronic): Secondary | ICD-10-CM

## 2021-07-25 DIAGNOSIS — D45 Polycythemia vera: Secondary | ICD-10-CM

## 2021-07-25 DIAGNOSIS — Z8639 Personal history of other endocrine, nutritional and metabolic disease: Secondary | ICD-10-CM | POA: Insufficient documentation

## 2021-07-25 LAB — CBC WITH DIFFERENTIAL (CANCER CENTER ONLY)
Abs Immature Granulocytes: 0.04 10*3/uL (ref 0.00–0.07)
Basophils Absolute: 0 10*3/uL (ref 0.0–0.1)
Basophils Relative: 1 %
Eosinophils Absolute: 0.1 10*3/uL (ref 0.0–0.5)
Eosinophils Relative: 1 %
HCT: 48 % (ref 39.0–52.0)
Hemoglobin: 16.2 g/dL (ref 13.0–17.0)
Immature Granulocytes: 1 %
Lymphocytes Relative: 27 %
Lymphs Abs: 1.8 10*3/uL (ref 0.7–4.0)
MCH: 31.9 pg (ref 26.0–34.0)
MCHC: 33.8 g/dL (ref 30.0–36.0)
MCV: 94.5 fL (ref 80.0–100.0)
Monocytes Absolute: 0.8 10*3/uL (ref 0.1–1.0)
Monocytes Relative: 12 %
Neutro Abs: 4.1 10*3/uL (ref 1.7–7.7)
Neutrophils Relative %: 58 %
Platelet Count: 222 10*3/uL (ref 150–400)
RBC: 5.08 MIL/uL (ref 4.22–5.81)
RDW: 13.2 % (ref 11.5–15.5)
WBC Count: 6.8 10*3/uL (ref 4.0–10.5)
nRBC: 0 % (ref 0.0–0.2)

## 2021-07-25 LAB — RETICULOCYTES
Immature Retic Fract: 9.8 % (ref 2.3–15.9)
RBC.: 5.12 MIL/uL (ref 4.22–5.81)
Retic Count, Absolute: 84.5 10*3/uL (ref 19.0–186.0)
Retic Ct Pct: 1.7 % (ref 0.4–3.1)

## 2021-07-25 MED ORDER — INFLUENZA VAC A&B SA ADJ QUAD 0.5 ML IM PRSY
PREFILLED_SYRINGE | INTRAMUSCULAR | 0 refills | Status: DC
  Filled 2021-07-25: qty 0.5, 1d supply, fill #0

## 2021-07-25 NOTE — Progress Notes (Signed)
Hematology and Oncology Follow Up Visit  Jesse [name] [id number] 04-01-1941 80 y.o. 07/25/2021   Principle Diagnosis:  Polycythemia - JAK2 negative   Current Therapy:        Phlebotomy to maintain hematocrit below 45% Aspirin 81 mg by mouth daily   Interim History:  Jesse [name] is here today for follow-up and phlebotomy. He is doing well but still has some lower back pain. He plans to contact Dr. Louanne Skye as he does not feel the Elavil is helping with his pain.  Hct is 48%.  No abnormal bruising, no petechiae. No blood loss noted.  No fever, chills, n/v, cough, rash, dizziness, SOB, chest pain, palpitations, abdominal pain or changes in bowel or bladder habits.  No swelling or tenderness in his extremities.  No falls or syncope to report.  He is eating well and staying properly hydrated. Her weight is 150 lbs.   ECOG Performance Status: 1 - Symptomatic but completely ambulatory  Medications:  Allergies as of 07/25/2021       Reactions   Hctz [hydrochlorothiazide] Other (See Comments)   Hyponatremia ; Na+ 111, w weakness resulting in mechanical fall   Tramadol Nausea And Vomiting        Medication List        Accurate as of July 25, 2021 11:24 AM. If you have any questions, ask your nurse or doctor.          ALPRAZolam 1 MG tablet Commonly known as: XANAX Take 0.5 mg by mouth at bedtime.   amitriptyline 10 MG tablet Commonly known as: ELAVIL TAKE 1 TABLET BY MOUTH EVERYDAY AT BEDTIME   aspirin EC 81 MG tablet Take 81 mg by mouth daily.   bisacodyl 10 MG suppository Commonly known as: DULCOLAX Place 10 mg rectally as needed for moderate constipation.   fish oil-omega-3 fatty acids 1000 MG capsule Take 1 g by mouth daily.   lisinopril 20 MG tablet Commonly known as: ZESTRIL Take 1 tablet (20 mg total) by mouth daily.   meloxicam 15 MG tablet Commonly known as: MOBIC TAKE 1 TABLET (15 MG TOTAL) BY MOUTH DAILY.   simvastatin 20 MG tablet Commonly  known as: ZOCOR Take 1 tablet (20 mg total) by mouth daily at 6 PM.   tamsulosin 0.4 MG Caps capsule Commonly known as: FLOMAX Take 0.4 mg by mouth daily.   vitamin E 1000 UNIT capsule Take 1,000 Units by mouth daily.        Allergies:  Allergies  Allergen Reactions   Hctz [Hydrochlorothiazide] Other (See Comments)    Hyponatremia ; Na+ 111, w weakness resulting in mechanical fall   Tramadol Nausea And Vomiting    Past Medical History, Surgical history, Social history, and Family History were reviewed and updated.  Review of Systems: All other 10 point review of systems is negative.   Physical Exam:  weight is 150 lb 12.8 oz (68.4 kg). His oral temperature is 97.9 F (36.6 C). His blood pressure is 128/73 and his pulse is 78. His respiration is 17 and oxygen saturation is 100%.   Wt Readings from Last 3 Encounters:  07/25/21 150 lb 12.8 oz (68.4 kg)  05/31/21 147 lb (66.7 kg)  05/30/21 147 lb 1.3 oz (66.7 kg)    Ocular: Sclerae unicteric, pupils equal, round and reactive to light Ear-nose-throat: Oropharynx clear, dentition fair Lymphatic: No cervical or supraclavicular adenopathy Lungs no rales or rhonchi, good excursion bilaterally Heart regular rate and rhythm, no murmur appreciated Abd soft, nontender,  positive bowel sounds MSK no focal spinal tenderness, no joint edema Neuro: non-focal, well-oriented, appropriate affect Breasts: Deferred   Lab Results  Component Value Date   WBC 6.8 07/25/2021   HGB 16.2 07/25/2021   HCT 48.0 07/25/2021   MCV 94.5 07/25/2021   PLT 222 07/25/2021   Lab Results  Component Value Date   FERRITIN 17 (L) 05/30/2021   IRON 112 05/30/2021   TIBC 344 05/30/2021   UIBC 232 05/30/2021   IRONPCTSAT 33 05/30/2021   Lab Results  Component Value Date   RETICCTPCT 1.7 07/25/2021   RBC 5.08 07/25/2021   RBC 5.12 07/25/2021   RETICCTABS 87.5 01/02/2011   No results found for: KPAFRELGTCHN, LAMBDASER, KAPLAMBRATIO No results  found for: IGGSERUM, IGA, IGMSERUM No results found for: Odetta Pink, SPEI   Chemistry      Component Value Date/Time   NA 138 04/13/2021 1424   NA 136 08/19/2017 1149   NA 135 (L) 10/21/2016 1151   K 3.5 04/13/2021 1424   K 3.8 08/19/2017 1149   K 3.9 10/21/2016 1151   CL 103 04/13/2021 1424   CL 95 (L) 08/19/2017 1149   CO2 26 04/13/2021 1424   CO2 28 08/19/2017 1149   CO2 25 10/21/2016 1151   BUN 13 04/13/2021 1424   BUN 14 08/19/2017 1149   BUN 13.6 10/21/2016 1151   CREATININE 0.79 04/13/2021 1424   CREATININE 1.0 08/19/2017 1149   CREATININE 0.8 10/21/2016 1151      Component Value Date/Time   CALCIUM 10.3 04/13/2021 1424   CALCIUM 10.1 08/19/2017 1149   CALCIUM 10.3 10/21/2016 1151   ALKPHOS 59 04/13/2021 1424   ALKPHOS 68 08/19/2017 1149   ALKPHOS 68 10/21/2016 1151   AST 14 (L) 04/13/2021 1424   AST 25 10/21/2016 1151   ALT 12 04/13/2021 1424   ALT 32 08/19/2017 1149   ALT 38 10/21/2016 1151   BILITOT 1.3 (H) 04/13/2021 1424   BILITOT 1.22 (H) 10/21/2016 1151       Impression and Plan: Jesse [name] is a very pleasant 80 yo caucasian gentleman with polycythemia, JAK-2 negative.  We will proceed with phlebotomy today for Hct 48%.  Lab and phlebotomy only in 8 weeks, Follow-up in 4 months.  She can contact our office with any questions or concerns.   Lottie Dawson, NP 11/2/202211:24 AM

## 2021-07-25 NOTE — Progress Notes (Signed)
Jesse [name] presents today for phlebotomy per MD orders. Phlebotomy procedure started at 1141 and ended at 1150. 555 grams removed. Patient observed for 30 minutes after procedure without any incident. Patient tolerated procedure well. IV needle removed intact.

## 2021-07-25 NOTE — Telephone Encounter (Signed)
Per 07/25/21 los gave upcoming appointment - confirmed

## 2021-07-25 NOTE — Patient Instructions (Signed)
Therapeutic Phlebotomy Therapeutic phlebotomy is the planned removal of blood from a person's body for the purpose of treating a medical condition. The procedure is similar to donating blood. Usually, about a pint (470 mL, or 0.47 L) of blood is removed. The average adult has 9-12 pints (4.3-5.7 L) of blood in the body. Therapeutic phlebotomy may be used to treat the following medical conditions: Hemochromatosis. This is a condition in which the blood contains too much iron. Polycythemia vera. This is a condition in which the blood contains too many red blood cells. Porphyria cutanea tarda. This is a disease in which an important part of hemoglobin is not made properly. It results in the buildup of abnormal amounts of porphyrins in the body. Sickle cell disease. This is a condition in which the red blood cells form an abnormal crescent shape rather than a round shape. Tell a health care provider about: Any allergies you have. All medicines you are taking, including vitamins, herbs, eye drops, creams, and over-the-counter medicines. Any problems you or family members have had with anesthetic medicines. Any blood disorders you have. Any surgeries you have had. Any medical conditions you have. Whether you are pregnant or may be pregnant. What are the risks? Generally, this is a safe procedure. However, problems may occur, including: Nausea or light-headedness. Low blood pressure (hypotension). Soreness, bleeding, swelling, or bruising at the needle insertion site. Infection. What happens before the procedure? Follow instructions from your health care provider about eating or drinking restrictions. Ask your health care provider about: Changing or stopping your regular medicines. This is especially important if you are taking diabetes medicines or blood thinners (anticoagulants). Taking medicines such as aspirin and ibuprofen. These medicines can thin your blood. Do not take these medicines  unless your health care provider tells you to take them. Taking over-the-counter medicines, vitamins, herbs, and supplements. Wear clothing with sleeves that can be raised above the elbow. Plan to have someone take you home from the hospital or clinic. You may have a blood sample taken. Your blood pressure, pulse rate, and breathing rate will be measured. What happens during the procedure?  To lower your risk of infection: Your health care team will wash or sanitize their hands. Your skin will be cleaned with an antiseptic. You may be given a medicine to numb the area (local anesthetic). A tourniquet will be placed on your arm. A needle will be inserted into one of your veins. Tubing and a collection bag will be attached to that needle. Blood will flow through the needle and tubing into the collection bag. The collection bag will be placed lower than your arm to allow gravity to help the flow of blood into the bag. You may be asked to open and close your hand slowly and continually during the entire collection. After the specified amount of blood has been removed from your body, the collection bag and tubing will be clamped. The needle will be removed from your vein. Pressure will be held on the site of the needle insertion to stop the bleeding. A bandage (dressing) will be placed over the needle insertion site. The procedure may vary among health care providers and hospitals. What happens after the procedure? Your blood pressure, pulse rate, and breathing rate will be measured after the procedure. You will be encouraged to drink fluids. Your recovery will be assessed and monitored. You can return to your normal activities as told by your health care provider. Summary Therapeutic phlebotomy is the planned removal   of blood from a person's body for the purpose of treating a medical condition. Therapeutic phlebotomy may be used to treat hemochromatosis, polycythemia vera, porphyria cutanea  tarda, or sickle cell disease. In the procedure, a needle is inserted and about a pint (470 mL, or 0.47 L) of blood is removed. The average adult has 9-12 pints (4.3-5.7 L) of blood in the body. This is generally a safe procedure, but it can sometimes cause problems such as nausea, light-headedness, or low blood pressure (hypotension). This information is not intended to replace advice given to you by your health care provider. Make sure you discuss any questions you have with your health care provider. Document Revised: 12/19/2020 Document Reviewed: 09/25/2017 Elsevier Patient Education  2022 [employer]  

## 2021-07-26 LAB — FERRITIN: Ferritin: 21 ng/mL — ABNORMAL LOW (ref 24–336)

## 2021-07-26 LAB — IRON AND TIBC
Iron: 112 ug/dL (ref 42–163)
Saturation Ratios: 31 % (ref 20–55)
TIBC: 365 ug/dL (ref 202–409)
UIBC: 253 ug/dL (ref 117–376)

## 2021-08-31 DIAGNOSIS — E871 Hypo-osmolality and hyponatremia: Secondary | ICD-10-CM | POA: Diagnosis not present

## 2021-08-31 DIAGNOSIS — M519 Unspecified thoracic, thoracolumbar and lumbosacral intervertebral disc disorder: Secondary | ICD-10-CM | POA: Diagnosis not present

## 2021-08-31 DIAGNOSIS — D45 Polycythemia vera: Secondary | ICD-10-CM | POA: Diagnosis not present

## 2021-08-31 DIAGNOSIS — I700 Atherosclerosis of aorta: Secondary | ICD-10-CM | POA: Diagnosis not present

## 2021-08-31 DIAGNOSIS — I10 Essential (primary) hypertension: Secondary | ICD-10-CM | POA: Diagnosis not present

## 2021-08-31 DIAGNOSIS — E538 Deficiency of other specified B group vitamins: Secondary | ICD-10-CM | POA: Diagnosis not present

## 2021-08-31 DIAGNOSIS — E7800 Pure hypercholesterolemia, unspecified: Secondary | ICD-10-CM | POA: Diagnosis not present

## 2021-08-31 DIAGNOSIS — G629 Polyneuropathy, unspecified: Secondary | ICD-10-CM | POA: Diagnosis not present

## 2021-09-19 ENCOUNTER — Inpatient Hospital Stay: Payer: Medicare Other

## 2021-09-19 ENCOUNTER — Inpatient Hospital Stay: Payer: Medicare Other | Attending: Family

## 2021-09-19 ENCOUNTER — Other Ambulatory Visit: Payer: Self-pay

## 2021-09-19 VITALS — BP 123/68 | HR 92 | Temp 98.2°F | Resp 17

## 2021-09-19 DIAGNOSIS — D45 Polycythemia vera: Secondary | ICD-10-CM

## 2021-09-19 DIAGNOSIS — D751 Secondary polycythemia: Secondary | ICD-10-CM | POA: Diagnosis not present

## 2021-09-19 DIAGNOSIS — Z8639 Personal history of other endocrine, nutritional and metabolic disease: Secondary | ICD-10-CM | POA: Insufficient documentation

## 2021-09-19 DIAGNOSIS — D500 Iron deficiency anemia secondary to blood loss (chronic): Secondary | ICD-10-CM

## 2021-09-19 LAB — CBC WITH DIFFERENTIAL (CANCER CENTER ONLY)
Abs Immature Granulocytes: 0.04 10*3/uL (ref 0.00–0.07)
Basophils Absolute: 0 10*3/uL (ref 0.0–0.1)
Basophils Relative: 1 %
Eosinophils Absolute: 0.1 10*3/uL (ref 0.0–0.5)
Eosinophils Relative: 1 %
HCT: 47.3 % (ref 39.0–52.0)
Hemoglobin: 15.6 g/dL (ref 13.0–17.0)
Immature Granulocytes: 1 %
Lymphocytes Relative: 29 %
Lymphs Abs: 1.9 10*3/uL (ref 0.7–4.0)
MCH: 31.2 pg (ref 26.0–34.0)
MCHC: 33 g/dL (ref 30.0–36.0)
MCV: 94.6 fL (ref 80.0–100.0)
Monocytes Absolute: 1 10*3/uL (ref 0.1–1.0)
Monocytes Relative: 16 %
Neutro Abs: 3.5 10*3/uL (ref 1.7–7.7)
Neutrophils Relative %: 52 %
Platelet Count: 223 10*3/uL (ref 150–400)
RBC: 5 MIL/uL (ref 4.22–5.81)
RDW: 13.7 % (ref 11.5–15.5)
WBC Count: 6.5 10*3/uL (ref 4.0–10.5)
nRBC: 0 % (ref 0.0–0.2)

## 2021-09-19 LAB — RETICULOCYTES
Immature Retic Fract: 11.2 % (ref 2.3–15.9)
RBC.: 4.99 MIL/uL (ref 4.22–5.81)
Retic Count, Absolute: 93.3 10*3/uL (ref 19.0–186.0)
Retic Ct Pct: 1.9 % (ref 0.4–3.1)

## 2021-09-19 LAB — IRON AND IRON BINDING CAPACITY (CC-WL,HP ONLY)
Iron: 87 ug/dL (ref 45–182)
Saturation Ratios: 21 % (ref 17.9–39.5)
TIBC: 413 ug/dL (ref 250–450)
UIBC: 326 ug/dL (ref 117–376)

## 2021-09-19 LAB — FERRITIN: Ferritin: 23 ng/mL — ABNORMAL LOW (ref 24–336)

## 2021-09-19 NOTE — Progress Notes (Signed)
Angie Fava presents today for phlebotomy per MD orders. Phlebotomy procedure started at 1035 and ended at 1048. 522 grams removed via 18 gauge needle to right AC. Patient observed for 30 minutes after procedure without any incident. Patient tolerated procedure well. IV needle removed intact.

## 2021-09-19 NOTE — Patient Instructions (Signed)
Therapeutic Phlebotomy °Therapeutic phlebotomy is the planned removal of blood from a person's body for the purpose of treating a medical condition. The procedure is lot like donating blood. Usually, about a pint (470 mL, or 0.47 L) of blood is removed. The average adult has 9-12 pints (4.3-5.7 L) of blood in his or her body. °Therapeutic phlebotomy may be used to treat the following medical conditions: °Hemochromatosis. This is a condition in which the blood contains too much iron. °Polycythemia vera. This is a condition in which the blood contains too many red blood cells. °Porphyria cutanea tarda. This is a disease in which an important part of hemoglobin is not made properly. It results in the buildup of abnormal amounts of porphyrins in the body. °Sickle cell disease. This is a condition in which the red blood cells form an abnormal crescent shape rather than a round shape. °Tell a health care provider about: °Any allergies you have. °All medicines you are taking, including vitamins, herbs, eye drops, creams, and over-the-counter medicines. °Any bleeding problems you have. °Any surgeries you have had. °Any medical conditions you have. °Whether you are pregnant or may be pregnant. °What are the risks? °Generally, this is a safe procedure. However, problems may occur, including: °Nausea or light-headedness. °Low blood pressure (hypotension). °Soreness, bleeding, swelling, or bruising at the needle insertion site. °Infection. °What happens before the procedure? °Ask your health care provider about: °Changing or stopping your regular medicines. This is especially important if you are taking diabetes medicines or blood thinners. °Taking medicines such as aspirin and ibuprofen. These medicines can thin your blood. Do not take these medicines unless your health care provider tells you to take them. °Taking over-the-counter medicines, vitamins, herbs, and supplements. °Wear clothing with sleeves that can be raised  above the elbow. °You may have a blood sample taken. °Your blood pressure, pulse rate, and breathing rate will be measured. °What happens during the procedure? ° °You may be given a medicine to numb the area (local anesthetic). °A tourniquet will be placed on your arm. °A needle will be put into one of your veins. °Tubing and a collection bag will be attached to the needle. °Blood will flow through the needle and tubing into the collection bag. °The collection bag will be placed lower than your arm so gravity can help the blood flow into the bag. °You may be asked to open and close your hand slowly and continually during the entire collection. °After the specified amount of blood has been removed from your body, the collection bag and tubing will be clamped. °The needle will be removed from your vein. °Pressure will be held on the needle site to stop the bleeding. °A bandage (dressing) will be placed over the needle insertion site. °The procedure may vary among health care providers and hospitals. °What happens after the procedure? °Your blood pressure, pulse rate, and breathing rate will be measured after the procedure. °You will be encouraged to drink fluids. °You will be encouraged to eat a snack to prevent a low blood sugar level. °Your recovery will be assessed and monitored. °Return to your normal activities as told by your health care provider. °Summary °Therapeutic phlebotomy is the planned removal of blood from a person's body for the purpose of treating a medical condition. °Therapeutic phlebotomy may be used to treat hemochromatosis, polycythemia vera, porphyria cutanea tarda, or sickle cell disease. °In the procedure, a needle is inserted and about a pint (470 mL, or 0.47 L) of blood is   removed. The average adult has 9-12 pints (4.3-5.7 L) of blood in the body. °This is generally a safe procedure, but it can sometimes cause problems such as nausea, light-headedness, or low blood pressure  (hypotension). °This information is not intended to replace advice given to you by your health care provider. Make sure you discuss any questions you have with your health care provider. °Document Revised: 03/07/2021 Document Reviewed: 03/07/2021 °Elsevier Patient Education © 2022 [employer] ° °

## 2021-10-07 ENCOUNTER — Other Ambulatory Visit: Payer: Self-pay | Admitting: Specialist

## 2021-11-15 ENCOUNTER — Encounter: Payer: Self-pay | Admitting: Specialist

## 2021-11-15 ENCOUNTER — Other Ambulatory Visit: Payer: Self-pay

## 2021-11-15 ENCOUNTER — Ambulatory Visit (INDEPENDENT_AMBULATORY_CARE_PROVIDER_SITE_OTHER): Payer: Medicare Other | Admitting: Specialist

## 2021-11-15 VITALS — BP 155/84 | HR 81 | Ht 66.0 in | Wt 151.0 lb

## 2021-11-15 DIAGNOSIS — M4726 Other spondylosis with radiculopathy, lumbar region: Secondary | ICD-10-CM

## 2021-11-15 DIAGNOSIS — M5412 Radiculopathy, cervical region: Secondary | ICD-10-CM | POA: Diagnosis not present

## 2021-11-15 DIAGNOSIS — M4156 Other secondary scoliosis, lumbar region: Secondary | ICD-10-CM

## 2021-11-15 DIAGNOSIS — M48062 Spinal stenosis, lumbar region with neurogenic claudication: Secondary | ICD-10-CM | POA: Diagnosis not present

## 2021-11-15 MED ORDER — PREGABALIN 50 MG PO CAPS: 50.0000 mg | ORAL_CAPSULE | Freq: Every day | ORAL | 0 refills | Status: DC

## 2021-11-15 NOTE — Progress Notes (Signed)
Office Visit Note   Patient: Jesse [name]           Date of Birth: Nov 01, 1940           MRN: [id number] Visit Date: 11/15/2021              Requested by: Wenda Low, MD [address] [place],  [place] [address] PCP: Wenda Low, MD   Assessment & Plan: Visit Diagnoses:  1. Other spondylosis with radiculopathy, lumbar region   2. Chronic cervical radiculopathy   3. Other secondary scoliosis, lumbar region   4. Spinal stenosis, lumbar region, with neurogenic claudication     Plan: Avoid bending, stooping and avoid lifting weights greater than 10 lbs. Avoid prolong standing and walking. Avoid frequent bending and stooping. Meloxicam for arthritis pain. Lyrica 50 mg po qhs for night pain.  No lifting greater than 10 lbs. May use ice or moist heat for pain. Weight loss is of benefit. Handicap license is approved. Dr. Romona Curls secretary/Assistant will call to arrange for epidural steroid injection    Follow-Up Instructions: No follow-ups on file.   Orders:  Orders Placed This Encounter  Procedures   Ambulatory referral to Physical Medicine Rehab   Meds ordered this encounter  Medications   pregabalin (LYRICA) 50 MG capsule    Sig: Take 1 capsule (50 mg total) by mouth at bedtime.    Dispense:  30 capsule    Refill:  0      Procedures: No procedures performed   Clinical Data: No additional findings.   Subjective: Chief Complaint  Patient presents with   Lower Back - Follow-up   Left Leg - Follow-up    81 year old male with 4-5 month history of back and left leg sciatica, underwent eval and ESI by Dr. Ernestina Patches. Reports that the pain is tolerable and he experiences some night pain and tosses around some. The  Pain is such that he is able to ambulate and shop for groceries and could walk a mile.   Review of Systems  Constitutional: Negative.   HENT: Negative.    Eyes: Negative.   Respiratory: Negative.    Cardiovascular: Negative.    Gastrointestinal: Negative.   Endocrine: Negative.   Genitourinary: Negative.   Musculoskeletal: Negative.   Skin: Negative.   Allergic/Immunologic: Negative.   Neurological: Negative.   Hematological: Negative.   Psychiatric/Behavioral: Negative.      Objective: Vital Signs: BP (!) 155/84 (BP Location: Left Arm, Patient Position: Sitting)    Pulse 81    Ht 5\' 6"  (1.676 m)    Wt 151 lb (68.5 kg)    BMI 24.37 kg/m   Physical Exam Constitutional:      Appearance: He is well-developed.  HENT:     Head: Normocephalic and atraumatic.  Eyes:     Pupils: Pupils are equal, round, and reactive to light.  Pulmonary:     Effort: Pulmonary effort is normal.     Breath sounds: Normal breath sounds.  Abdominal:     General: Bowel sounds are normal.     Palpations: Abdomen is soft.  Musculoskeletal:     Cervical back: Normal range of motion and neck supple.  Skin:    General: Skin is warm and dry.  Neurological:     Mental Status: He is alert and oriented to person, place, and time.  Psychiatric:        Behavior: Behavior normal.        Thought Content:  Thought content normal.        Judgment: Judgment normal.   Back Exam   Tenderness  The patient is experiencing tenderness in the lumbar.  Range of Motion  Extension:  abnormal  Flexion:  abnormal  Lateral bend right:  abnormal  Lateral bend left:  abnormal  Rotation right:  abnormal  Rotation left:  abnormal   Muscle Strength  Right Quadriceps:  5/5  Left Quadriceps:  5/5  Right Hamstrings:  5/5  Left Hamstrings:  5/5   Reflexes  Patellar:  0/4 Achilles:  0/4  Comments:  SLR is negative    Specialty Comments:  No specialty comments available.  Imaging: No results found.   PMFS History: Patient Active Problem List   Diagnosis Date Noted   UPJ obstruction, acquired 03/02/2021   Diverticulosis 03/02/2021   Cervical spondylosis 03/02/2021   Unspecified protein-calorie malnutrition ([place]) 03/02/2021    Transaminitis 03/02/2021   Hyponatremia 02/22/2021   Essential hypertension 02/22/2021   Hyperlipidemia 02/22/2021   Fall at home, subsequent encounter 02/22/2021   Need for prophylactic vaccination and inoculation against influenza 06/17/2016   Polycythemia rubra vera ([facility]) 09/02/2011   HZ (herpes zoster) 09/02/2011   Past Medical History:  Diagnosis Date   Depression    Hypercholesteremia    Hypertension    HZ (herpes zoster) 09/02/2011   Neuritis    left foot   Polycythemia rubra vera ([facility]) 09/02/2011    Family History  Problem Relation Age of Onset   Heart attack Mother     Past Surgical History:  Procedure Laterality Date   GAS INSERTION  06/04/2012   Procedure: INSERTION OF GAS;  Surgeon: Hayden Pedro, MD;  Location: [facility];  Service: Ophthalmology;  Laterality: Left;  C3F8   SCLERAL BUCKLE  06/04/2012   Procedure: SCLERAL BUCKLE;  Surgeon: Hayden Pedro, MD;  Location: [facility];  Service: Ophthalmology;  Laterality: Left;  Headscope laser   Social History   Occupational History    Comment: retired  Tobacco Use   Smoking status: Never   Smokeless tobacco: Never   Tobacco comments:    never used tobacco  Vaping Use   Vaping Use: Never used  Substance and Sexual Activity   Alcohol use: No    Alcohol/week: 0.0 standard drinks   Drug use: No   Sexual activity: Not Currently

## 2021-11-15 NOTE — Patient Instructions (Signed)
Plan: Avoid bending, stooping and avoid lifting weights greater than 10 lbs. Avoid prolong standing and walking. Avoid frequent bending and stooping. Meloxicam for arthritis pain. Lyrica 50 mg po qhs for night pain.  No lifting greater than 10 lbs. May use ice or moist heat for pain. Weight loss is of benefit. Handicap license is approved. Dr. Romona Curls secretary/Assistant will call to arrange for epidural steroid injection

## 2021-11-22 ENCOUNTER — Inpatient Hospital Stay: Payer: Medicare Other | Attending: Family

## 2021-11-22 DIAGNOSIS — D751 Secondary polycythemia: Secondary | ICD-10-CM | POA: Insufficient documentation

## 2021-11-23 ENCOUNTER — Inpatient Hospital Stay: Payer: Medicare Other

## 2021-11-23 ENCOUNTER — Inpatient Hospital Stay: Payer: Medicare Other | Admitting: Family

## 2021-11-23 DIAGNOSIS — H31003 Unspecified chorioretinal scars, bilateral: Secondary | ICD-10-CM | POA: Diagnosis not present

## 2021-12-06 ENCOUNTER — Ambulatory Visit: Payer: Self-pay

## 2021-12-06 ENCOUNTER — Encounter: Payer: Self-pay | Admitting: Physical Medicine and Rehabilitation

## 2021-12-06 ENCOUNTER — Other Ambulatory Visit: Payer: Self-pay

## 2021-12-06 ENCOUNTER — Ambulatory Visit: Payer: Medicare Other | Admitting: Physical Medicine and Rehabilitation

## 2021-12-06 VITALS — BP 147/90 | HR 90

## 2021-12-06 DIAGNOSIS — M5416 Radiculopathy, lumbar region: Secondary | ICD-10-CM

## 2021-12-06 MED ORDER — METHYLPREDNISOLONE ACETATE 80 MG/ML IJ SUSP
80.0000 mg | Freq: Once | INTRAMUSCULAR | Status: AC
  Administered 2021-12-06: 80 mg

## 2021-12-06 NOTE — Patient Instructions (Signed)

## 2021-12-06 NOTE — Progress Notes (Signed)
Pt state lower back pain that travels to his left leg. Pt state bending makes the pain worse. Pt state he takes pain meds to help ease his pain.  Numeric Pain Rating Scale and Functional Assessment Average Pain 4   In the last MONTH (on 0-10 scale) has pain interfered with the following?  1. General activity like being  able to carry out your everyday physical activities such as walking, climbing stairs, carrying groceries, or moving a chair?  Rating(8)   +Driver, -BT, -Dye Allergies.  

## 2021-12-11 ENCOUNTER — Inpatient Hospital Stay: Payer: Medicare Other

## 2021-12-11 ENCOUNTER — Encounter: Payer: Self-pay | Admitting: Family

## 2021-12-11 ENCOUNTER — Inpatient Hospital Stay: Payer: Medicare Other | Admitting: Family

## 2021-12-11 ENCOUNTER — Other Ambulatory Visit: Payer: Self-pay

## 2021-12-11 VITALS — BP 120/73 | HR 88 | Resp 18

## 2021-12-11 VITALS — BP 133/76 | HR 91 | Temp 97.8°F | Resp 18 | Wt 158.8 lb

## 2021-12-11 DIAGNOSIS — D45 Polycythemia vera: Secondary | ICD-10-CM

## 2021-12-11 DIAGNOSIS — D500 Iron deficiency anemia secondary to blood loss (chronic): Secondary | ICD-10-CM | POA: Diagnosis not present

## 2021-12-11 DIAGNOSIS — D751 Secondary polycythemia: Secondary | ICD-10-CM

## 2021-12-11 LAB — CBC WITH DIFFERENTIAL (CANCER CENTER ONLY)
Abs Immature Granulocytes: 0.04 10*3/uL (ref 0.00–0.07)
Basophils Absolute: 0 10*3/uL (ref 0.0–0.1)
Basophils Relative: 0 %
Eosinophils Absolute: 0.1 10*3/uL (ref 0.0–0.5)
Eosinophils Relative: 1 %
HCT: 50 % (ref 39.0–52.0)
Hemoglobin: 16.9 g/dL (ref 13.0–17.0)
Immature Granulocytes: 1 %
Lymphocytes Relative: 31 %
Lymphs Abs: 2.5 10*3/uL (ref 0.7–4.0)
MCH: 31.1 pg (ref 26.0–34.0)
MCHC: 33.8 g/dL (ref 30.0–36.0)
MCV: 92.1 fL (ref 80.0–100.0)
Monocytes Absolute: 1.1 10*3/uL — ABNORMAL HIGH (ref 0.1–1.0)
Monocytes Relative: 13 %
Neutro Abs: 4.3 10*3/uL (ref 1.7–7.7)
Neutrophils Relative %: 54 %
Platelet Count: 232 10*3/uL (ref 150–400)
RBC: 5.43 MIL/uL (ref 4.22–5.81)
RDW: 14.6 % (ref 11.5–15.5)
WBC Count: 8 10*3/uL (ref 4.0–10.5)
nRBC: 0 % (ref 0.0–0.2)

## 2021-12-11 LAB — RETICULOCYTES
Immature Retic Fract: 11.7 % (ref 2.3–15.9)
RBC.: 5.51 MIL/uL (ref 4.22–5.81)
Retic Count, Absolute: 86.5 10*3/uL (ref 19.0–186.0)
Retic Ct Pct: 1.6 % (ref 0.4–3.1)

## 2021-12-11 LAB — IRON AND IRON BINDING CAPACITY (CC-WL,HP ONLY)
Iron: 139 ug/dL (ref 45–182)
Saturation Ratios: 33 % (ref 17.9–39.5)
TIBC: 420 ug/dL (ref 250–450)
UIBC: 281 ug/dL (ref 117–376)

## 2021-12-11 LAB — FERRITIN: Ferritin: 24 ng/mL (ref 24–336)

## 2021-12-11 NOTE — Progress Notes (Signed)
?Hematology and Oncology Follow Up Visit ? ?Jesse [name] ?[id number] ?06/30/1941 81 y.o. ?12/11/2021 ? ? ?Principle Diagnosis:  ?Polycythemia - JAK2 negative ?  ?Current Therapy:        ?Phlebotomy to maintain hematocrit below 45% ?Aspirin 81 mg by mouth daily ?  ?Interim History:  Jesse [name] is here today for follow-up and phlebotomy. He is doing well but notes pain in his left leg and lower back. He states that he got an injection in his back last week and is feeling better.  ?No swelling, numbness or tingling in his extremities.  ?No falls or syncope to report.  ?No fever, chills, n/v, cough, rash, dizziness, SOB, chest pain, palpitations, abdominal pain or changes in bowel or bladder habits.  ?He has maintained a good appetite and is staying well hydrated throughout the day.  ?His weight is stable at 158 lbs.  ? ?ECOG Performance Status: 1 - Symptomatic but completely ambulatory ? ?Medications:  ?Allergies as of 12/11/2021   ? ?   Reactions  ? Hctz [hydrochlorothiazide] Other (See Comments)  ? Hyponatremia ; Na+ 111, w weakness resulting in mechanical fall  ? Tramadol Nausea And Vomiting  ? ?  ? ?  ?Medication List  ?  ? ?  ? Accurate as of December 11, 2021  9:00 AM. If you have any questions, ask your nurse or doctor.  ?  ?  ? ?  ? ?ALPRAZolam 1 MG tablet ?Commonly known as: [name] ?Take 0.5 mg by mouth at bedtime. ?  ?aspirin EC 81 MG tablet ?Take 81 mg by mouth daily. ?  ?bisacodyl 10 MG suppository ?Commonly known as: DULCOLAX ?Place 10 mg rectally as needed for moderate constipation. ?  ?fish oil-omega-3 fatty acids 1000 MG capsule ?Take 1 g by mouth daily. ?  ?Fluad Quadrivalent 0.5 ML injection ?Generic drug: influenza vaccine adjuvanted ?Inject into the muscle. ?  ?lisinopril 20 MG tablet ?Commonly known as: ZESTRIL ?Take 1 tablet (20 mg total) by mouth daily. ?  ?meloxicam 15 MG tablet ?Commonly known as: MOBIC ?TAKE 1 TABLET (15 MG TOTAL) BY MOUTH DAILY. ?  ?pregabalin 50 MG capsule ?Commonly known as:  LYRICA ?Take 1 capsule (50 mg total) by mouth at bedtime. ?  ?simvastatin 20 MG tablet ?Commonly known as: ZOCOR ?Take 1 tablet (20 mg total) by mouth daily at 6 PM. ?  ?tamsulosin 0.4 MG Caps capsule ?Commonly known as: FLOMAX ?Take 0.4 mg by mouth daily. ?  ?vitamin E 1000 UNIT capsule ?Take 1,000 Units by mouth daily. ?  ? ?  ? ? ?Allergies:  ?Allergies  ?Allergen Reactions  ? Hctz [Hydrochlorothiazide] Other (See Comments)  ?  Hyponatremia ; Na+ 111, w weakness resulting in mechanical fall  ? Tramadol Nausea And Vomiting  ? ? ?Past Medical History, Surgical history, Social history, and Family History were reviewed and updated. ? ?Review of Systems: ?All other 10 point review of systems is negative.  ? ?Physical Exam: ? vitals were not taken for this visit.  ? ?Wt Readings from Last 3 Encounters:  ?11/15/21 151 lb (68.5 kg)  ?07/25/21 150 lb 12.8 oz (68.4 kg)  ?05/31/21 147 lb (66.7 kg)  ? ? ?Ocular: Sclerae unicteric, pupils equal, round and reactive to light ?Ear-nose-throat: Oropharynx clear, dentition fair ?Lymphatic: No cervical or supraclavicular adenopathy ?Lungs no rales or rhonchi, good excursion bilaterally ?Heart regular rate and rhythm, no murmur appreciated ?Abd soft, nontender, positive bowel sounds ?MSK no focal spinal tenderness, no joint edema ?Neuro: non-focal, well-oriented,  appropriate affect ?Breasts: Deferred  ? ?Lab Results  ?Component Value Date  ? WBC 8.0 12/11/2021  ? HGB 16.9 12/11/2021  ? HCT 50.0 12/11/2021  ? MCV 92.1 12/11/2021  ? PLT 232 12/11/2021  ? ?Lab Results  ?Component Value Date  ? FERRITIN 23 (L) 09/19/2021  ? IRON 87 09/19/2021  ? TIBC 413 09/19/2021  ? UIBC 326 09/19/2021  ? IRONPCTSAT 21 09/19/2021  ? ?Lab Results  ?Component Value Date  ? RETICCTPCT 1.6 12/11/2021  ? RBC 5.43 12/11/2021  ? RBC 5.51 12/11/2021  ? RETICCTABS 87.5 01/02/2011  ? ?No results found for: KPAFRELGTCHN, LAMBDASER, KAPLAMBRATIO ?No results found for: IGGSERUM, IGA, IGMSERUM ?No results found  for: TOTALPROTELP, ALBUMINELP, A1GS, A2GS, BETS, BETA2SER, GAMS, MSPIKE, SPEI ?  Chemistry   ?   ?Component Value Date/Time  ? NA 138 04/13/2021 1424  ? NA 136 08/19/2017 1149  ? NA 135 (L) 10/21/2016 1151  ? K 3.5 04/13/2021 1424  ? K 3.8 08/19/2017 1149  ? K 3.9 10/21/2016 1151  ? CL 103 04/13/2021 1424  ? CL 95 (L) 08/19/2017 1149  ? CO2 26 04/13/2021 1424  ? CO2 28 08/19/2017 1149  ? CO2 25 10/21/2016 1151  ? BUN 13 04/13/2021 1424  ? BUN 14 08/19/2017 1149  ? BUN 13.6 10/21/2016 1151  ? CREATININE 0.79 04/13/2021 1424  ? CREATININE 1.0 08/19/2017 1149  ? CREATININE 0.8 10/21/2016 1151  ?    ?Component Value Date/Time  ? CALCIUM 10.3 04/13/2021 1424  ? CALCIUM 10.1 08/19/2017 1149  ? CALCIUM 10.3 10/21/2016 1151  ? ALKPHOS 59 04/13/2021 1424  ? ALKPHOS 68 08/19/2017 1149  ? ALKPHOS 68 10/21/2016 1151  ? AST 14 (L) 04/13/2021 1424  ? AST 25 10/21/2016 1151  ? ALT 12 04/13/2021 1424  ? ALT 32 08/19/2017 1149  ? ALT 38 10/21/2016 1151  ? BILITOT 1.3 (H) 04/13/2021 1424  ? BILITOT 1.22 (H) 10/21/2016 1151  ?  ? ? ? ?Impression and Plan: Jesse [name] is a very pleasant 81 yo caucasian gentleman with polycythemia, JAK-2 negative.  ?We will proceed with phlebotomy today for Hct 50%.  ?Lab and phlebotomy in 8 weeks, follow-up in 4 months.  ? ?Lottie Dawson, NP ?3/21/20239:00 AM ? ?

## 2021-12-11 NOTE — Progress Notes (Signed)
Angie Fava presents today for phlebotomy per MD orders. ?Phlebotomy procedure started at 0923 and ended at 0935. ?510 grams removed via phlebotomy kit to right ac by Lovenia Kim RN. ?Patient observed for 30 minutes after procedure without any incident. ?Patient tolerated procedure well. ?IV needle removed intact. ? ? ?

## 2021-12-11 NOTE — Patient Instructions (Signed)
Therapeutic Phlebotomy °Therapeutic phlebotomy is the planned removal of blood from a person's body for the purpose of treating a medical condition. The procedure is lot like donating blood. Usually, about a pint (470 mL, or 0.47 L) of blood is removed. The average adult has 9-12 pints (4.3-5.7 L) of blood in his or her body. °Therapeutic phlebotomy may be used to treat the following medical conditions: °Hemochromatosis. This is a condition in which the blood contains too much iron. °Polycythemia vera. This is a condition in which the blood contains too many red blood cells. °Porphyria cutanea tarda. This is a disease in which an important part of hemoglobin is not made properly. It results in the buildup of abnormal amounts of porphyrins in the body. °Sickle cell disease. This is a condition in which the red blood cells form an abnormal crescent shape rather than a round shape. °Tell a health care provider about: °Any allergies you have. °All medicines you are taking, including vitamins, herbs, eye drops, creams, and over-the-counter medicines. °Any bleeding problems you have. °Any surgeries you have had. °Any medical conditions you have. °Whether you are pregnant or may be pregnant. °What are the risks? °Generally, this is a safe procedure. However, problems may occur, including: °Nausea or light-headedness. °Low blood pressure (hypotension). °Soreness, bleeding, swelling, or bruising at the needle insertion site. °Infection. °What happens before the procedure? °Ask your health care provider about: °Changing or stopping your regular medicines. This is especially important if you are taking diabetes medicines or blood thinners. °Taking medicines such as aspirin and ibuprofen. These medicines can thin your blood. Do not take these medicines unless your health care provider tells you to take them. °Taking over-the-counter medicines, vitamins, herbs, and supplements. °Wear clothing with sleeves that can be raised  above the elbow. °You may have a blood sample taken. °Your blood pressure, pulse rate, and breathing rate will be measured. °What happens during the procedure? ° °You may be given a medicine to numb the area (local anesthetic). °A tourniquet will be placed on your arm. °A needle will be put into one of your veins. °Tubing and a collection bag will be attached to the needle. °Blood will flow through the needle and tubing into the collection bag. °The collection bag will be placed lower than your arm so gravity can help the blood flow into the bag. °You may be asked to open and close your hand slowly and continually during the entire collection. °After the specified amount of blood has been removed from your body, the collection bag and tubing will be clamped. °The needle will be removed from your vein. °Pressure will be held on the needle site to stop the bleeding. °A bandage (dressing) will be placed over the needle insertion site. °The procedure may vary among health care providers and hospitals. °What happens after the procedure? °Your blood pressure, pulse rate, and breathing rate will be measured after the procedure. °You will be encouraged to drink fluids. °You will be encouraged to eat a snack to prevent a low blood sugar level. °Your recovery will be assessed and monitored. °Return to your normal activities as told by your health care provider. °Summary °Therapeutic phlebotomy is the planned removal of blood from a person's body for the purpose of treating a medical condition. °Therapeutic phlebotomy may be used to treat hemochromatosis, polycythemia vera, porphyria cutanea tarda, or sickle cell disease. °In the procedure, a needle is inserted and about a pint (470 mL, or 0.47 L) of blood is   removed. The average adult has 9-12 pints (4.3-5.7 L) of blood in the body. °This is generally a safe procedure, but it can sometimes cause problems such as nausea, light-headedness, or low blood pressure  (hypotension). °This information is not intended to replace advice given to you by your health care provider. Make sure you discuss any questions you have with your health care provider. °Document Revised: 03/07/2021 Document Reviewed: 03/07/2021 °Elsevier Patient Education © 2022 [employer] ° °

## 2021-12-14 ENCOUNTER — Other Ambulatory Visit: Payer: Self-pay

## 2021-12-14 ENCOUNTER — Ambulatory Visit: Payer: Medicare Other | Admitting: Specialist

## 2021-12-14 VITALS — BP 139/81 | HR 84 | Temp 97.9°F | Ht 60.0 in | Wt 150.0 lb

## 2021-12-14 DIAGNOSIS — M4726 Other spondylosis with radiculopathy, lumbar region: Secondary | ICD-10-CM

## 2021-12-14 DIAGNOSIS — M5416 Radiculopathy, lumbar region: Secondary | ICD-10-CM

## 2021-12-14 DIAGNOSIS — M545 Low back pain, unspecified: Secondary | ICD-10-CM | POA: Diagnosis not present

## 2021-12-14 DIAGNOSIS — M4156 Other secondary scoliosis, lumbar region: Secondary | ICD-10-CM | POA: Diagnosis not present

## 2021-12-14 DIAGNOSIS — M5412 Radiculopathy, cervical region: Secondary | ICD-10-CM

## 2021-12-14 DIAGNOSIS — M48062 Spinal stenosis, lumbar region with neurogenic claudication: Secondary | ICD-10-CM

## 2021-12-14 NOTE — Progress Notes (Signed)
? ?Office Visit Note ?  ?Patient: Jesse [name]           ?Date of Birth: Sep 16, 1941           ?MRN: [id number] ?Visit Date: 12/14/2021 ?             ?Requested by: Wenda Low, MD ?[address] ?[address] ?[place],  [place] [address] ?PCP: Wenda Low, MD ? ? ?Assessment & Plan: ?Visit Diagnoses:  ?1. Lumbar radiculopathy   ?2. Other spondylosis with radiculopathy, lumbar region   ?3. Chronic cervical radiculopathy   ?4. Other secondary scoliosis, lumbar region   ?5. Spinal stenosis, lumbar region, with neurogenic claudication   ?6. Low back pain, unspecified back pain laterality, unspecified chronicity, unspecified whether sciatica present   ? ? ?Plan: Avoid bending, stooping and avoid lifting weights greater than 10 lbs. ?Avoid prolong standing and walking. ?Avoid frequent bending and stooping  ?No lifting greater than 10 lbs. ?May use ice or moist heat for pain. ?Weight loss is of benefit. ?Call if the pain recurrs and we would schedule for  ?Dr. Romona Curls secretary/Assistant will call to arrange for epidural steroid injection   ? ?Follow-Up Instructions: Return in about 4 weeks (around 01/11/2022).  ? ?Orders:  ?No orders of the defined types were placed in this encounter. ? ?No orders of the defined types were placed in this encounter. ? ? ? ? Procedures: ?No procedures performed ? ? ?Clinical Data: ?No additional findings. ? ? ?Subjective: ?Chief Complaint  ?Patient presents with  ? Lower Back - Follow-up  ? ? ?HPI ? ?Review of Systems ? ? ?Objective: ?Vital Signs: BP 139/81 (BP Location: Left Arm, Patient Position: Sitting, Cuff Size: Small)   Pulse 84   Temp 97.9 ?F (36.6 ?C) (Oral)   Ht 5' (1.524 m)   Wt 150 lb (68 kg)   BMI 29.29 kg/m?  ? ?Physical Exam ? ?Ortho Exam ? ?Specialty Comments:  ?STUDY DATE: 07/14/2020 ?PATIENT NAME: Jesse [name] ?DOB: 02/08/1941 ?MRN: [id number] ?  ?EXAM: MRI of the lumbar spine without contrast ?  ?ORDERING CLINICIAN: Andrey Spearman MD ?CLINICAL HISTORY:  81 year old man with left lumbar radiculopathy ?COMPARISON FILMS: None ?  ?TECHNIQUE: MRI of the lumbar spine was obtained utilizing 4 mm sagittal slices from P80-99 down to the lower sacrum with T1, T2 and inversion recovery views. In addition 4 mm axial slices from I3-3 down to L5-S1 level were included with T1 and T2 weighted views. ?CONTRAST: None ?IMAGING SITE: [facility], [address] [place], [place] ?  ?  ?FINDINGS: On sagittal images, the spine is imaged from T11 to the sacrum.   The conus medullaris and cauda equine appear normal.   There is 1 to 2 mm of retrolisthesis of T12 upon L1, 1 mm retrolisthesis of L1 upon L2, 2 mm retrolisthesis of L2 upon 3 and 1 mm retrolisthesis of L4 upon L5.  There is mild scoliosis, convex to the left.  There is reduced disc height at all of the lumbar levels, most pronounced at L2-L3.  Endplate degenerative changes are noted at L1-L2, L2-L3 and L3-L4.  A Schmorl's node enters the inferior endplate of L1. ?  ?The discs and interspaces were further evaluated on axial views from L1 to S1 as follows: ?  ?T12-L1: There is minimal retrolisthesis, minimal disc protrusion to the left and endplate spurring.  There is mild left foraminal stenosis but no nerve root compression or spinal stenosis. ?  ?L1-L2: There is minimal retrolisthesis,  disc bulging.  There is mild lateral recess stenosis but no nerve root compression or spinal stenosis. ?  ?L2-L3: There is 2 mm retrolisthesis associated with disc protrusion and endplate spurring.  There is mild foraminal narrowing, mild left lateral recess stenosis and moderate right lateral recess stenosis.  There is no nerve root compression or spinal stenosis. ?  ?L3-L4: There is disc bulging, endplate spurring, mild facet hypertrophy combining to cause moderate bilateral foraminal narrowing and moderate bilateral lateral recess stenosis.  There is no definite nerve root compression though the degenerative changes encroach upon the  exiting and traversing nerve roots. ?  ?L4-L5: There is minimal retrolisthesis, disc bulging, endplate spurring and moderate ligamenta flava hypertrophy causing moderately severe left foraminal narrowing and severe left lateral recess stenosis.  There is also mild right foraminal narrowing and lateral recess stenosis.  There is potential for left L4 and left L5 nerve root compression. ?  ?L5-S1: There is a right paramedian disc protrusion and endplate spurring causing moderate right foraminal narrowing, moderately severe right lateral recess stenosis moderate left foraminal narrowing and moderate left lateral recess stenosis.  There is potential for right S1 nerve root compression. ?  ?  ?IMPRESSION: This MRI of the lumbar spine without contrast shows the following: ?1.    There is minimal to mild retrolisthesis of T12-L1, L1-L2, L2-L3 and L4-L5. ?2.    At L4-L5, degenerative changes cause moderately severe left foraminal narrowing and severe left lateral recess stenosis with potential for left L4 and left L5 nerve root compression. ?3.    At L5-S1, degenerative changes cause moderately severe right lateral recess stenosis with potential for right S1 nerve root compression. ?4.    There are milder degenerative changes at the other lumbar levels that do not lead to nerve root compression or spinal stenosis. ?5.    Mild scoliosis, convex to the left. ?  ?  ?  ?INTERPRETING PHYSICIAN:  ?Richard A. Felecia Shelling, MD, PhD, [facility] ? ?Imaging: ?No results found. ? ? ?PMFS History: ?Patient Active Problem List  ? Diagnosis Date Noted  ? UPJ obstruction, acquired 03/02/2021  ? Diverticulosis 03/02/2021  ? Cervical spondylosis 03/02/2021  ? Unspecified protein-calorie malnutrition ([place]) 03/02/2021  ? Transaminitis 03/02/2021  ? Hyponatremia 02/22/2021  ? Essential hypertension 02/22/2021  ? Hyperlipidemia 02/22/2021  ? Fall at home, subsequent encounter 02/22/2021  ? Need for prophylactic vaccination and inoculation against influenza  06/17/2016  ? Polycythemia rubra vera ([place]) 09/02/2011  ? HZ (herpes zoster) 09/02/2011  ? ?Past Medical History:  ?Diagnosis Date  ? Depression   ? Hypercholesteremia   ? Hypertension   ? HZ (herpes zoster) 09/02/2011  ? Neuritis   ? left foot  ? Polycythemia rubra vera ([place]) 09/02/2011  ?  ?Family History  ?Problem Relation Age of Onset  ? Heart attack Mother   ?  ?Past Surgical History:  ?Procedure Laterality Date  ? GAS INSERTION  06/04/2012  ? Procedure: INSERTION OF GAS;  Surgeon: Hayden Pedro, MD;  Location: [facility];  Service: Ophthalmology;  Laterality: Left;  C3F8  ? SCLERAL BUCKLE  06/04/2012  ? Procedure: SCLERAL BUCKLE;  Surgeon: Hayden Pedro, MD;  Location: [facility];  Service: Ophthalmology;  Laterality: Left;  Headscope laser  ? ?Social History  ? ?Occupational History  ?  Comment: retired  ?Tobacco Use  ? Smoking status: Never  ? Smokeless tobacco: Never  ? Tobacco comments:  ?  never used tobacco  ?Vaping Use  ? Vaping Use: Never  used  ?Substance and Sexual Activity  ? Alcohol use: No  ?  Alcohol/week: 0.0 standard drinks  ? Drug use: No  ? Sexual activity: Not Currently  ? ? ? ? ? ? ?

## 2021-12-14 NOTE — Patient Instructions (Signed)
Plan: Avoid bending, stooping and avoid lifting weights greater than 10 lbs. ?Avoid prolong standing and walking. ?Avoid frequent bending and stooping  ?No lifting greater than 10 lbs. ?May use ice or moist heat for pain. ?Weight loss is of benefit. ?Call if the pain recurrs and we would schedule for  ?Dr. Romona Curls secretary/Assistant will call to arrange for epidural steroid injection  ?

## 2021-12-17 DIAGNOSIS — I10 Essential (primary) hypertension: Secondary | ICD-10-CM | POA: Diagnosis not present

## 2021-12-17 DIAGNOSIS — Z961 Presence of intraocular lens: Secondary | ICD-10-CM | POA: Diagnosis not present

## 2021-12-17 DIAGNOSIS — H26493 Other secondary cataract, bilateral: Secondary | ICD-10-CM | POA: Diagnosis not present

## 2021-12-17 DIAGNOSIS — H40013 Open angle with borderline findings, low risk, bilateral: Secondary | ICD-10-CM | POA: Diagnosis not present

## 2021-12-17 DIAGNOSIS — H26492 Other secondary cataract, left eye: Secondary | ICD-10-CM | POA: Diagnosis not present

## 2021-12-18 NOTE — Procedures (Signed)
Lumbosacral Transforaminal Epidural Steroid Injection - Sub-Pedicular Approach with Fluoroscopic Guidance ? ?Patient: Jesse [name]      ?Date of Birth: [birth year] ?MRN: [id number] ?PCP: Wenda Low, MD      ?Visit Date: 12/06/2021 ?  ?Universal Protocol:    ?Date/Time: 12/06/2021 ? ?Consent Given By: the patient ? ?Position: PRONE ? ?Additional Comments: ?Vital signs were monitored before and after the procedure. ?Patient was prepped and draped in the usual sterile fashion. ?The correct patient, procedure, and site was verified. ? ? ?Injection Procedure Details:  ? ?Procedure diagnoses: Lumbar radiculopathy [M54.16]   ? ?Meds Administered:  ?Meds ordered this encounter  ?Medications  ? methylPREDNISolone acetate (DEPO-MEDROL) injection 80 mg  ? ? ?Laterality: Left ? ?Location/Site: L4 ? ?Needle:5.0 in., 22 ga.  Short bevel or Quincke spinal needle ? ?Needle Placement: Transforaminal ? ?Findings: ?  ? -Comments: Excellent flow of contrast along the nerve, nerve root and into the epidural space. ? ?Procedure Details: ?After squaring off the end-plates to get a true AP view, the C-arm was positioned so that an oblique view of the foramen as noted above was visualized. The target area is just inferior to the "nose of the scotty dog" or sub pedicular. The soft tissues overlying this structure were infiltrated with 2-3 ml. of 1% Lidocaine without Epinephrine. ? ?The spinal needle was inserted toward the target using a "trajectory" view along the fluoroscope beam.  Under AP and lateral visualization, the needle was advanced so it did not puncture dura and was located close the 6 O'Clock position of the pedical in AP tracterory. Biplanar projections were used to confirm position. Aspiration was confirmed to be negative for CSF and/or blood. A 1-2 ml. volume of Isovue-250 was injected and flow of contrast was noted at each level. Radiographs were obtained for documentation purposes.  ? ?After attaining the desired flow of  contrast documented above, a 0.5 to 1.0 ml test dose of 0.25% Marcaine was injected into each respective transforaminal space.  The patient was observed for 90 seconds post injection.  After no sensory deficits were reported, and normal lower extremity motor function was noted,   the above injectate was administered so that equal amounts of the injectate were placed at each foramen (level) into the transforaminal epidural space. ? ? ?Additional Comments:  ?The patient tolerated the procedure well ?Dressing: 2 x 2 sterile gauze and Band-Aid ?  ? ?Post-procedure details: ?Patient was observed during the procedure. ?Post-procedure instructions were reviewed. ? ?Patient left the clinic in stable condition. ? ?

## 2021-12-18 NOTE — Progress Notes (Signed)
? ?DRAGO HAMMONDS - 81 y.o. male MRN [id number]  Date of birth: 03-Dec-1940 ? ?Office Visit Note: ?Visit Date: 12/06/2021 ?PCP: Wenda Low, MD ?Referred by: Wenda Low, MD ? ?Subjective: ?Chief Complaint  ?Patient presents with  ? Lower Back - Pain  ? Left Leg - Pain  ? ?HPI:  Jesse [name] is a 81 y.o. male who comes in today at the request of Dr. Basil Dess for planned Left L4-5 Lumbar Transforaminal epidural steroid injection with fluoroscopic guidance.  The patient has failed conservative care including home exercise, medications, time and activity modification.  This injection will be diagnostic and hopefully therapeutic.  Please see requesting physician notes for further details and justification. ? ?ROS Otherwise per HPI. ? ?Assessment & Plan: ?Visit Diagnoses:  ?  ICD-10-CM   ?1. Lumbar radiculopathy  M54.16 XR C-ARM NO REPORT  ?  Epidural Steroid injection  ?  methylPREDNISolone acetate (DEPO-MEDROL) injection 80 mg  ?  ?  ?Plan: No additional findings.  ? ?Meds & Orders:  ?Meds ordered this encounter  ?Medications  ? methylPREDNISolone acetate (DEPO-MEDROL) injection 80 mg  ?  ?Orders Placed This Encounter  ?Procedures  ? XR C-ARM NO REPORT  ? Epidural Steroid injection  ?  ?Follow-up: Return for visit to requesting provider as needed.  ? ?Procedures: ?No procedures performed  ?Lumbosacral Transforaminal Epidural Steroid Injection - Sub-Pedicular Approach with Fluoroscopic Guidance ? ?Patient: Jesse [name]      ?Date of Birth: [birth year] ?MRN: [id number] ?PCP: Wenda Low, MD      ?Visit Date: 12/06/2021 ?  ?Universal Protocol:    ?Date/Time: 12/06/2021 ? ?Consent Given By: the patient ? ?Position: PRONE ? ?Additional Comments: ?Vital signs were monitored before and after the procedure. ?Patient was prepped and draped in the usual sterile fashion. ?The correct patient, procedure, and site was verified. ? ? ?Injection Procedure Details:  ? ?Procedure diagnoses: Lumbar radiculopathy [M54.16]    ? ?Meds Administered:  ?Meds ordered this encounter  ?Medications  ? methylPREDNISolone acetate (DEPO-MEDROL) injection 80 mg  ? ? ?Laterality: Left ? ?Location/Site: L4 ? ?Needle:5.0 in., 22 ga.  Short bevel or Quincke spinal needle ? ?Needle Placement: Transforaminal ? ?Findings: ?  ? -Comments: Excellent flow of contrast along the nerve, nerve root and into the epidural space. ? ?Procedure Details: ?After squaring off the end-plates to get a true AP view, the C-arm was positioned so that an oblique view of the foramen as noted above was visualized. The target area is just inferior to the "nose of the scotty dog" or sub pedicular. The soft tissues overlying this structure were infiltrated with 2-3 ml. of 1% Lidocaine without Epinephrine. ? ?The spinal needle was inserted toward the target using a "trajectory" view along the fluoroscope beam.  Under AP and lateral visualization, the needle was advanced so it did not puncture dura and was located close the 6 O'Clock position of the pedical in AP tracterory. Biplanar projections were used to confirm position. Aspiration was confirmed to be negative for CSF and/or blood. A 1-2 ml. volume of Isovue-250 was injected and flow of contrast was noted at each level. Radiographs were obtained for documentation purposes.  ? ?After attaining the desired flow of contrast documented above, a 0.5 to 1.0 ml test dose of 0.25% Marcaine was injected into each respective transforaminal space.  The patient was observed for 90 seconds post injection.  After no sensory deficits were reported, and normal lower extremity motor function was noted,   the  above injectate was administered so that equal amounts of the injectate were placed at each foramen (level) into the transforaminal epidural space. ? ? ?Additional Comments:  ?The patient tolerated the procedure well ?Dressing: 2 x 2 sterile gauze and Band-Aid ?  ? ?Post-procedure details: ?Patient was observed during the  procedure. ?Post-procedure instructions were reviewed. ? ?Patient left the clinic in stable condition. ?  ? ?Clinical History: ?STUDY DATE: 07/14/2020 ?PATIENT NAME: Jesse [name] ?DOB: Aug 05, 1941 ?MRN: [id number] ?  ?EXAM: MRI of the lumbar spine without contrast ?  ?ORDERING CLINICIAN: Andrey Spearman MD ?CLINICAL HISTORY: 81 year old man with left lumbar radiculopathy ?COMPARISON FILMS: None ?  ?TECHNIQUE: MRI of the lumbar spine was obtained utilizing 4 mm sagittal slices from E42-35 down to the lower sacrum with T1, T2 and inversion recovery views. In addition 4 mm axial slices from T6-1 down to L5-S1 level were included with T1 and T2 weighted views. ?CONTRAST: None ?IMAGING SITE: [facility], [address], [place], [place] ?  ?  ?FINDINGS: On sagittal images, the spine is imaged from T11 to the sacrum.   The conus medullaris and cauda equine appear normal.   There is 1 to 2 mm of retrolisthesis of T12 upon L1, 1 mm retrolisthesis of L1 upon L2, 2 mm retrolisthesis of L2 upon 3 and 1 mm retrolisthesis of L4 upon L5.  There is mild scoliosis, convex to the left.  There is reduced disc height at all of the lumbar levels, most pronounced at L2-L3.  Endplate degenerative changes are noted at L1-L2, L2-L3 and L3-L4.  A Schmorl's node enters the inferior endplate of L1. ?  ?The discs and interspaces were further evaluated on axial views from L1 to S1 as follows: ?  ?T12-L1: There is minimal retrolisthesis, minimal disc protrusion to the left and endplate spurring.  There is mild left foraminal stenosis but no nerve root compression or spinal stenosis. ?  ?L1-L2: There is minimal retrolisthesis, disc bulging.  There is mild lateral recess stenosis but no nerve root compression or spinal stenosis. ?  ?L2-L3: There is 2 mm retrolisthesis associated with disc protrusion and endplate spurring.  There is mild foraminal narrowing, mild left lateral recess stenosis and moderate right lateral recess stenosis.   There is no nerve root compression or spinal stenosis. ?  ?L3-L4: There is disc bulging, endplate spurring, mild facet hypertrophy combining to cause moderate bilateral foraminal narrowing and moderate bilateral lateral recess stenosis.  There is no definite nerve root compression though the degenerative changes encroach upon the exiting and traversing nerve roots. ?  ?L4-L5: There is minimal retrolisthesis, disc bulging, endplate spurring and moderate ligamenta flava hypertrophy causing moderately severe left foraminal narrowing and severe left lateral recess stenosis.  There is also mild right foraminal narrowing and lateral recess stenosis.  There is potential for left L4 and left L5 nerve root compression. ?  ?L5-S1: There is a right paramedian disc protrusion and endplate spurring causing moderate right foraminal narrowing, moderately severe right lateral recess stenosis moderate left foraminal narrowing and moderate left lateral recess stenosis.  There is potential for right S1 nerve root compression. ?  ?  ?IMPRESSION: This MRI of the lumbar spine without contrast shows the following: ?1.    There is minimal to mild retrolisthesis of T12-L1, L1-L2, L2-L3 and L4-L5. ?2.    At L4-L5, degenerative changes cause moderately severe left foraminal narrowing and severe left lateral recess stenosis with potential for left L4 and left L5 nerve root compression. ?  3.    At L5-S1, degenerative changes cause moderately severe right lateral recess stenosis with potential for right S1 nerve root compression. ?4.    There are milder degenerative changes at the other lumbar levels that do not lead to nerve root compression or spinal stenosis. ?5.    Mild scoliosis, convex to the left. ?  ?  ?  ?INTERPRETING PHYSICIAN:  ?Richard A. Felecia Shelling, MD, PhD, [facility]  ? ? ? ?Objective:  VS:  HT:    WT:   BMI:     BP:(!) 147/90  HR:90bpm  TEMP: ( )  RESP:  ?Physical Exam ?Vitals and nursing note reviewed.  ?Constitutional:   ?   General:  He is not in acute distress. ?   Appearance: Normal appearance. He is not ill-appearing.  ?HENT:  ?   Head: Normocephalic and atraumatic.  ?   Right Ear: External ear normal.  ?   Left Ear: External ear normal.  ?   Nose: No congest

## 2021-12-24 DIAGNOSIS — Z9842 Cataract extraction status, left eye: Secondary | ICD-10-CM | POA: Diagnosis not present

## 2022-01-04 ENCOUNTER — Other Ambulatory Visit: Payer: Self-pay | Admitting: Specialist

## 2022-01-04 DIAGNOSIS — M5416 Radiculopathy, lumbar region: Secondary | ICD-10-CM

## 2022-01-08 DIAGNOSIS — I10 Essential (primary) hypertension: Secondary | ICD-10-CM | POA: Diagnosis not present

## 2022-01-08 DIAGNOSIS — Q6100 Congenital renal cyst, unspecified: Secondary | ICD-10-CM | POA: Diagnosis not present

## 2022-01-08 DIAGNOSIS — E871 Hypo-osmolality and hyponatremia: Secondary | ICD-10-CM | POA: Diagnosis not present

## 2022-01-08 DIAGNOSIS — I700 Atherosclerosis of aorta: Secondary | ICD-10-CM | POA: Diagnosis not present

## 2022-01-08 DIAGNOSIS — M519 Unspecified thoracic, thoracolumbar and lumbosacral intervertebral disc disorder: Secondary | ICD-10-CM | POA: Diagnosis not present

## 2022-01-08 DIAGNOSIS — E538 Deficiency of other specified B group vitamins: Secondary | ICD-10-CM | POA: Diagnosis not present

## 2022-01-08 DIAGNOSIS — E7800 Pure hypercholesterolemia, unspecified: Secondary | ICD-10-CM | POA: Diagnosis not present

## 2022-01-08 DIAGNOSIS — G629 Polyneuropathy, unspecified: Secondary | ICD-10-CM | POA: Diagnosis not present

## 2022-01-08 DIAGNOSIS — D45 Polycythemia vera: Secondary | ICD-10-CM | POA: Diagnosis not present

## 2022-02-05 ENCOUNTER — Inpatient Hospital Stay: Payer: Medicare Other | Attending: Family

## 2022-02-05 ENCOUNTER — Inpatient Hospital Stay: Payer: Medicare Other

## 2022-02-05 VITALS — BP 113/70 | HR 94 | Temp 97.9°F | Resp 18

## 2022-02-05 DIAGNOSIS — D751 Secondary polycythemia: Secondary | ICD-10-CM | POA: Insufficient documentation

## 2022-02-05 DIAGNOSIS — D500 Iron deficiency anemia secondary to blood loss (chronic): Secondary | ICD-10-CM

## 2022-02-05 DIAGNOSIS — D45 Polycythemia vera: Secondary | ICD-10-CM

## 2022-02-05 DIAGNOSIS — Z862 Personal history of diseases of the blood and blood-forming organs and certain disorders involving the immune mechanism: Secondary | ICD-10-CM | POA: Diagnosis not present

## 2022-02-05 LAB — CMP (CANCER CENTER ONLY)
ALT: 20 U/L (ref 0–44)
AST: 17 U/L (ref 15–41)
Albumin: 4.3 g/dL (ref 3.5–5.0)
Alkaline Phosphatase: 47 U/L (ref 38–126)
Anion gap: 5 (ref 5–15)
BUN: 19 mg/dL (ref 8–23)
CO2: 29 mmol/L (ref 22–32)
Calcium: 10.4 mg/dL — ABNORMAL HIGH (ref 8.9–10.3)
Chloride: 98 mmol/L (ref 98–111)
Creatinine: 0.86 mg/dL (ref 0.61–1.24)
GFR, Estimated: >60 mL/min (ref >60)
Glucose, Bld: 98 mg/dL (ref 70–99)
Potassium: 4.2 mmol/L (ref 3.5–5.1)
Sodium: 132 mmol/L — ABNORMAL LOW (ref 135–145)
Total Bilirubin: 1.5 mg/dL — ABNORMAL HIGH (ref 0.3–1.2)
Total Protein: 7.1 g/dL (ref 6.5–8.1)

## 2022-02-05 LAB — CBC WITH DIFFERENTIAL (CANCER CENTER ONLY)
Abs Immature Granulocytes: 0.04 10*3/uL (ref 0.00–0.07)
Basophils Absolute: 0 10*3/uL (ref 0.0–0.1)
Basophils Relative: 0 %
Eosinophils Absolute: 0.1 10*3/uL (ref 0.0–0.5)
Eosinophils Relative: 1 %
HCT: 50.2 % (ref 39.0–52.0)
Hemoglobin: 16.7 g/dL (ref 13.0–17.0)
Immature Granulocytes: 1 %
Lymphocytes Relative: 29 %
Lymphs Abs: 2 10*3/uL (ref 0.7–4.0)
MCH: 31.1 pg (ref 26.0–34.0)
MCHC: 33.3 g/dL (ref 30.0–36.0)
MCV: 93.5 fL (ref 80.0–100.0)
Monocytes Absolute: 0.9 10*3/uL (ref 0.1–1.0)
Monocytes Relative: 14 %
Neutro Abs: 3.7 10*3/uL (ref 1.7–7.7)
Neutrophils Relative %: 55 %
Platelet Count: 234 10*3/uL (ref 150–400)
RBC: 5.37 MIL/uL (ref 4.22–5.81)
RDW: 14.6 % (ref 11.5–15.5)
WBC Count: 6.8 10*3/uL (ref 4.0–10.5)
nRBC: 0 % (ref 0.0–0.2)

## 2022-02-05 LAB — FERRITIN: Ferritin: 15 ng/mL — ABNORMAL LOW (ref 24–336)

## 2022-02-05 LAB — IRON AND IRON BINDING CAPACITY (CC-WL,HP ONLY)
Iron: 98 ug/dL (ref 45–182)
Saturation Ratios: 22 % (ref 17.9–39.5)
TIBC: 440 ug/dL (ref 250–450)
UIBC: 342 ug/dL (ref 117–376)

## 2022-02-05 NOTE — Patient Instructions (Signed)

## 2022-02-05 NOTE — Progress Notes (Signed)
Jesse [name] presents today for phlebotomy per MD orders. ?Phlebotomy procedure started at 1005 and ended at 1010. ?360 cc removed via 16 G needle at [facility] site. Blood stopped flowing and patient refused a second stick. ?Patient tolerated procedure well. ? ? ? ?

## 2022-04-03 ENCOUNTER — Other Ambulatory Visit: Payer: Self-pay | Admitting: Specialist

## 2022-04-03 DIAGNOSIS — M5416 Radiculopathy, lumbar region: Secondary | ICD-10-CM

## 2022-04-09 ENCOUNTER — Other Ambulatory Visit: Payer: Medicare Other

## 2022-04-09 ENCOUNTER — Ambulatory Visit: Payer: Medicare Other | Admitting: Family

## 2022-04-10 ENCOUNTER — Telehealth: Payer: Self-pay | Admitting: Specialist

## 2022-04-10 NOTE — Telephone Encounter (Signed)
Patient called wanting to make an appointment for his back pain, when offered the next available he wanted to see if he could be seen sooner if possible. I scheduled him on 8/10 @ 10:30. CB # [phone]

## 2022-04-11 ENCOUNTER — Other Ambulatory Visit: Payer: Self-pay

## 2022-04-11 ENCOUNTER — Inpatient Hospital Stay: Payer: Medicare Other | Attending: Family

## 2022-04-11 ENCOUNTER — Inpatient Hospital Stay (HOSPITAL_BASED_OUTPATIENT_CLINIC_OR_DEPARTMENT_OTHER): Payer: Medicare Other | Admitting: Family

## 2022-04-11 ENCOUNTER — Inpatient Hospital Stay: Payer: Medicare Other

## 2022-04-11 ENCOUNTER — Encounter: Payer: Self-pay | Admitting: Family

## 2022-04-11 VITALS — BP 123/72 | HR 91 | Temp 97.7°F | Resp 18 | Ht 60.0 in | Wt 152.8 lb

## 2022-04-11 DIAGNOSIS — D751 Secondary polycythemia: Secondary | ICD-10-CM

## 2022-04-11 DIAGNOSIS — D500 Iron deficiency anemia secondary to blood loss (chronic): Secondary | ICD-10-CM

## 2022-04-11 LAB — CBC WITH DIFFERENTIAL (CANCER CENTER ONLY)
Abs Immature Granulocytes: 0.03 10*3/uL (ref 0.00–0.07)
Basophils Absolute: 0 10*3/uL (ref 0.0–0.1)
Basophils Relative: 0 %
Eosinophils Absolute: 0.1 10*3/uL (ref 0.0–0.5)
Eosinophils Relative: 1 %
HCT: 50.1 % (ref 39.0–52.0)
Hemoglobin: 16.8 g/dL (ref 13.0–17.0)
Immature Granulocytes: 0 %
Lymphocytes Relative: 24 %
Lymphs Abs: 1.7 10*3/uL (ref 0.7–4.0)
MCH: 31.3 pg (ref 26.0–34.0)
MCHC: 33.5 g/dL (ref 30.0–36.0)
MCV: 93.5 fL (ref 80.0–100.0)
Monocytes Absolute: 0.9 10*3/uL (ref 0.1–1.0)
Monocytes Relative: 13 %
Neutro Abs: 4.1 10*3/uL (ref 1.7–7.7)
Neutrophils Relative %: 62 %
Platelet Count: 226 10*3/uL (ref 150–400)
RBC: 5.36 MIL/uL (ref 4.22–5.81)
RDW: 14 % (ref 11.5–15.5)
WBC Count: 6.8 10*3/uL (ref 4.0–10.5)
nRBC: 0 % (ref 0.0–0.2)

## 2022-04-11 LAB — IRON AND IRON BINDING CAPACITY (CC-WL,HP ONLY)
Iron: 118 ug/dL (ref 45–182)
Saturation Ratios: 28 % (ref 17.9–39.5)
TIBC: 416 ug/dL (ref 250–450)
UIBC: 298 ug/dL (ref 117–376)

## 2022-04-11 LAB — CMP (CANCER CENTER ONLY)
ALT: 18 U/L (ref 0–44)
AST: 16 U/L (ref 15–41)
Albumin: 4.6 g/dL (ref 3.5–5.0)
Alkaline Phosphatase: 51 U/L (ref 38–126)
Anion gap: 8 (ref 5–15)
BUN: 16 mg/dL (ref 8–23)
CO2: 28 mmol/L (ref 22–32)
Calcium: 10.5 mg/dL — ABNORMAL HIGH (ref 8.9–10.3)
Chloride: 96 mmol/L — ABNORMAL LOW (ref 98–111)
Creatinine: 0.99 mg/dL (ref 0.61–1.24)
GFR, Estimated: >60 mL/min (ref >60)
Glucose, Bld: 107 mg/dL — ABNORMAL HIGH (ref 70–99)
Potassium: 3.9 mmol/L (ref 3.5–5.1)
Sodium: 132 mmol/L — ABNORMAL LOW (ref 135–145)
Total Bilirubin: 2 mg/dL — ABNORMAL HIGH (ref 0.3–1.2)
Total Protein: 7.1 g/dL (ref 6.5–8.1)

## 2022-04-11 LAB — FERRITIN: Ferritin: 18 ng/mL — ABNORMAL LOW (ref 24–336)

## 2022-04-11 NOTE — Telephone Encounter (Signed)
I called and advised that I have put him on the cancellation list

## 2022-04-11 NOTE — Patient Instructions (Signed)

## 2022-04-11 NOTE — Progress Notes (Signed)
Jesse [name] presents today for phlebotomy per MD orders. Phlebotomy procedure started at 1034 and ended at 1050. 489 grams removed from rt [facility] using 16g phlebotomy kit by DSmith, RN Patient observed for 30 minutes after procedure without any incident. Patient tolerated procedure well. IV needle removed intact.

## 2022-04-11 NOTE — Progress Notes (Signed)
Hematology and Oncology Follow Up Visit  Jesse [name] [id number] 10/12/1940 81 y.o. 04/11/2022   Principle Diagnosis:  Polycythemia - JAK2 negative   Current Therapy:        Phlebotomy to maintain hematocrit below 45% Aspirin 81 mg by mouth daily   Interim History:  Jesse [name] is here today for follow-up. He is doing well but notes some occasional fatigue.  Hct today is 50.1%.  No fever, chills, n/v, cough, rash, dizziness, SOB, chest pain, palpittions, abdominal pain or changes in bowel or bladder habits.  No swelling in his extremities.  He has numbness, tingling and tenderness in his lower extremities that comes and goes due to chronic lower back issues. He goes regularly for an injection in the back which he feels helps.  No falls or syncope to report.  Appetite comes and goes but he is staying well hydrated throughout the day. His weight is stable at 152 lbs.   ECOG Performance Status: 1 - Symptomatic but completely ambulatory  Medications:  Allergies as of 04/11/2022       Reactions   Hctz [hydrochlorothiazide] Other (See Comments)   Hyponatremia ; Na+ 111, w weakness resulting in mechanical fall   Tramadol Nausea And Vomiting        Medication List        Accurate as of April 11, 2022 10:19 AM. If you have any questions, ask your nurse or doctor.          ALPRAZolam 1 MG tablet Commonly known as: XANAX Take 0.5 mg by mouth at bedtime.   aspirin EC 81 MG tablet Take 81 mg by mouth daily.   bisacodyl 10 MG suppository Commonly known as: DULCOLAX Place 10 mg rectally as needed for moderate constipation.   fish oil-omega-3 fatty acids 1000 MG capsule Take 1 g by mouth daily.   Fluad Quadrivalent 0.5 ML injection Generic drug: influenza vaccine adjuvanted Inject into the muscle.   lisinopril 20 MG tablet Commonly known as: ZESTRIL Take 1 tablet (20 mg total) by mouth daily.   meloxicam 15 MG tablet Commonly known as: MOBIC TAKE 1 TABLET (15 MG  TOTAL) BY MOUTH DAILY.   pregabalin 50 MG capsule Commonly known as: LYRICA Take 1 capsule (50 mg total) by mouth at bedtime.   simvastatin 20 MG tablet Commonly known as: ZOCOR Take 1 tablet (20 mg total) by mouth daily at 6 PM.   tamsulosin 0.4 MG Caps capsule Commonly known as: FLOMAX Take 0.4 mg by mouth daily.   vitamin E 1000 UNIT capsule Take 1,000 Units by mouth daily.        Allergies:  Allergies  Allergen Reactions   Hctz [Hydrochlorothiazide] Other (See Comments)    Hyponatremia ; Na+ 111, w weakness resulting in mechanical fall   Tramadol Nausea And Vomiting    Past Medical History, Surgical history, Social history, and Family History were reviewed and updated.  Review of Systems: All other 10 point review of systems is negative.   Physical Exam:  vitals were not taken for this visit.   Wt Readings from Last 3 Encounters:  12/14/21 150 lb (68 kg)  12/11/21 158 lb 12.8 oz (72 kg)  11/15/21 151 lb (68.5 kg)    Ocular: Sclerae unicteric, pupils equal, round and reactive to light Ear-nose-throat: Oropharynx clear, dentition fair Lymphatic: No cervical or supraclavicular adenopathy Lungs no rales or rhonchi, good excursion bilaterally Heart regular rate and rhythm, no murmur appreciated Abd soft, nontender, positive bowel sounds MSK  no focal spinal tenderness, no joint edema Neuro: non-focal, well-oriented, appropriate affect Breasts: Deferred   Lab Results  Component Value Date   WBC 6.8 04/11/2022   HGB 16.8 04/11/2022   HCT 50.1 04/11/2022   MCV 93.5 04/11/2022   PLT 226 04/11/2022   Lab Results  Component Value Date   FERRITIN 15 (L) 02/05/2022   IRON 98 02/05/2022   TIBC 440 02/05/2022   UIBC 342 02/05/2022   IRONPCTSAT 22 02/05/2022   Lab Results  Component Value Date   RETICCTPCT 1.6 12/11/2021   RBC 5.36 04/11/2022   RETICCTABS 87.5 01/02/2011   No results found for: "KPAFRELGTCHN", "LAMBDASER", "KAPLAMBRATIO" No results found  for: "IGGSERUM", "IGA", "IGMSERUM" No results found for: "TOTALPROTELP", "ALBUMINELP", "A1GS", "A2GS", "BETS", "BETA2SER", "GAMS", "MSPIKE", "SPEI"   Chemistry      Component Value Date/Time   NA 132 (L) 02/05/2022 0934   NA 136 08/19/2017 1149   NA 135 (L) 10/21/2016 1151   K 4.2 02/05/2022 0934   K 3.8 08/19/2017 1149   K 3.9 10/21/2016 1151   CL 98 02/05/2022 0934   CL 95 (L) 08/19/2017 1149   CO2 29 02/05/2022 0934   CO2 28 08/19/2017 1149   CO2 25 10/21/2016 1151   BUN 19 02/05/2022 0934   BUN 14 08/19/2017 1149   BUN 13.6 10/21/2016 1151   CREATININE 0.86 02/05/2022 0934   CREATININE 1.0 08/19/2017 1149   CREATININE 0.8 10/21/2016 1151      Component Value Date/Time   CALCIUM 10.4 (H) 02/05/2022 0934   CALCIUM 10.1 08/19/2017 1149   CALCIUM 10.3 10/21/2016 1151   ALKPHOS 47 02/05/2022 0934   ALKPHOS 68 08/19/2017 1149   ALKPHOS 68 10/21/2016 1151   AST 17 02/05/2022 0934   AST 25 10/21/2016 1151   ALT 20 02/05/2022 0934   ALT 32 08/19/2017 1149   ALT 38 10/21/2016 1151   BILITOT 1.5 (H) 02/05/2022 0934   BILITOT 1.22 (H) 10/21/2016 1151       Impression and Plan: Jesse [name] is a very pleasant 81 yo caucasian gentleman with polycythemia, JAK-2 negative.  We will proceed with phlebotomy today for Hct 50.1%.  Lab check in 8 weeks, follow-up in 4 months.   Lottie Dawson, NP 7/20/202310:19 AM

## 2022-04-12 ENCOUNTER — Ambulatory Visit: Payer: Medicare Other | Admitting: Family

## 2022-04-12 ENCOUNTER — Other Ambulatory Visit: Payer: Medicare Other

## 2022-04-17 DIAGNOSIS — R3914 Feeling of incomplete bladder emptying: Secondary | ICD-10-CM | POA: Diagnosis not present

## 2022-04-18 ENCOUNTER — Encounter: Payer: Self-pay | Admitting: Specialist

## 2022-04-18 ENCOUNTER — Ambulatory Visit: Payer: Medicare Other | Admitting: Specialist

## 2022-04-18 VITALS — BP 127/66 | HR 90 | Ht 65.0 in | Wt 152.0 lb

## 2022-04-18 DIAGNOSIS — M48062 Spinal stenosis, lumbar region with neurogenic claudication: Secondary | ICD-10-CM

## 2022-04-18 DIAGNOSIS — M47812 Spondylosis without myelopathy or radiculopathy, cervical region: Secondary | ICD-10-CM | POA: Diagnosis not present

## 2022-04-18 DIAGNOSIS — M5412 Radiculopathy, cervical region: Secondary | ICD-10-CM

## 2022-04-18 DIAGNOSIS — M5416 Radiculopathy, lumbar region: Secondary | ICD-10-CM | POA: Diagnosis not present

## 2022-04-18 DIAGNOSIS — M4156 Other secondary scoliosis, lumbar region: Secondary | ICD-10-CM

## 2022-04-18 NOTE — Progress Notes (Signed)
Office Visit Note   Patient: Jesse [name]           Date of Birth: Feb 02, 1941           MRN: [id number] Visit Date: 04/18/2022              Requested by: Wenda Low, MD [address] [place],  [place] [address] PCP: Wenda Low, MD   Assessment & Plan: Visit Diagnoses:  1. Lumbar radiculopathy   2. Other secondary scoliosis, lumbar region   3. Spinal stenosis, lumbar region, with neurogenic claudication   4. Chronic cervical radiculopathy   5. Cervical spondylosis     Plan: Avoid bending, stooping and avoid lifting weights greater than 10 lbs. Avoid prolong standing and walking. Avoid frequent bending and stooping  No lifting greater than 10 lbs. May use ice or moist heat for pain. Weight loss is of benefit. Handicap license is approved. Dr. Romona Curls secretary/Assistant will call to arrange for epidural steroid injection    Follow-Up Instructions: No follow-ups on file.   Orders:  No orders of the defined types were placed in this encounter.  No orders of the defined types were placed in this encounter.     Procedures: No procedures performed   Clinical Data: No additional findings.   Subjective: Chief Complaint  Patient presents with   Lower Back - Pain    81 year old male with history of scoliosis and spondylosis changes. Reports difficulty with sleep and takes xanax a half of a tablet and it helps. He sleeps about 6-7 up to 8 hours. No bowel or bladder difficulty. He is having pain in the left lateral calf and left upper buttock pain and this is fairly consistent.     Review of Systems  Constitutional: Negative.   HENT: Negative.    Eyes: Negative.   Respiratory: Negative.    Cardiovascular: Negative.   Gastrointestinal: Negative.   Endocrine: Negative.   Genitourinary: Negative.   Musculoskeletal: Negative.   Skin: Negative.   Allergic/Immunologic: Negative.   Neurological: Negative.   Hematological: Negative.    Psychiatric/Behavioral: Negative.       Objective: Vital Signs: BP 127/66   Pulse 90   Ht '5\' 5"'$  (1.651 m)   Wt 152 lb (68.9 kg)   BMI 25.29 kg/m   Physical Exam Constitutional:      Appearance: He is well-developed.  HENT:     Head: Normocephalic and atraumatic.  Eyes:     Pupils: Pupils are equal, round, and reactive to light.  Pulmonary:     Effort: Pulmonary effort is normal.     Breath sounds: Normal breath sounds.  Abdominal:     General: Bowel sounds are normal.     Palpations: Abdomen is soft.  Musculoskeletal:     Cervical back: Normal range of motion and neck supple.     Lumbar back: Positive right straight leg raise test and positive left straight leg raise test.  Skin:    General: Skin is warm and dry.  Neurological:     Mental Status: He is alert and oriented to person, place, and time.  Psychiatric:        Behavior: Behavior normal.        Thought Content: Thought content normal.        Judgment: Judgment normal.     Back Exam   Tenderness  The patient is experiencing tenderness in the lumbar.  Range of Motion  Extension:  abnormal  Flexion:  abnormal  Lateral bend right:  abnormal  Lateral bend left:  abnormal  Rotation right:  abnormal  Rotation left:  abnormal   Muscle Strength  Right Quadriceps:  5/5  Left Quadriceps:  5/5  Right Hamstrings:  5/5  Left Hamstrings:  5/5   Tests  Straight leg raise right: positive Straight leg raise left: positive  Reflexes  Patellar:  2/4 Achilles:  2/4      Specialty Comments:  STUDY DATE: 07/14/2020 PATIENT NAME: Jesse [name] DOB: 06-Jun-1941 MRN: [id number]   EXAM: MRI of the lumbar spine without contrast   ORDERING CLINICIAN: Andrey Spearman MD CLINICAL HISTORY: 81 year old man with left lumbar radiculopathy COMPARISON FILMS: None   TECHNIQUE: MRI of the lumbar spine was obtained utilizing 4 mm sagittal slices from N27-78 down to the lower sacrum with T1, T2 and inversion recovery  views. In addition 4 mm axial slices from E4-2 down to L5-S1 level were included with T1 and T2 weighted views. CONTRAST: None IMAGING SITE: [facility], [address] [place], [place], [place]     FINDINGS: On sagittal images, the spine is imaged from T11 to the sacrum.   The conus medullaris and cauda equine appear normal.   There is 1 to 2 mm of retrolisthesis of T12 upon L1, 1 mm retrolisthesis of L1 upon L2, 2 mm retrolisthesis of L2 upon 3 and 1 mm retrolisthesis of L4 upon L5.  There is mild scoliosis, convex to the left.  There is reduced disc height at all of the lumbar levels, most pronounced at L2-L3.  Endplate degenerative changes are noted at L1-L2, L2-L3 and L3-L4.  A Schmorl's node enters the inferior endplate of L1.   The discs and interspaces were further evaluated on axial views from L1 to S1 as follows:   T12-L1: There is minimal retrolisthesis, minimal disc protrusion to the left and endplate spurring.  There is mild left foraminal stenosis but no nerve root compression or spinal stenosis.   L1-L2: There is minimal retrolisthesis, disc bulging.  There is mild lateral recess stenosis but no nerve root compression or spinal stenosis.   L2-L3: There is 2 mm retrolisthesis associated with disc protrusion and endplate spurring.  There is mild foraminal narrowing, mild left lateral recess stenosis and moderate right lateral recess stenosis.  There is no nerve root compression or spinal stenosis.   L3-L4: There is disc bulging, endplate spurring, mild facet hypertrophy combining to cause moderate bilateral foraminal narrowing and moderate bilateral lateral recess stenosis.  There is no definite nerve root compression though the degenerative changes encroach upon the exiting and traversing nerve roots.   L4-L5: There is minimal retrolisthesis, disc bulging, endplate spurring and moderate ligamenta flava hypertrophy causing moderately severe left foraminal narrowing and severe left  lateral recess stenosis.  There is also mild right foraminal narrowing and lateral recess stenosis.  There is potential for left L4 and left L5 nerve root compression.   L5-S1: There is a right paramedian disc protrusion and endplate spurring causing moderate right foraminal narrowing, moderately severe right lateral recess stenosis moderate left foraminal narrowing and moderate left lateral recess stenosis.  There is potential for right S1 nerve root compression.     IMPRESSION: This MRI of the lumbar spine without contrast shows the following: 1.    There is minimal to mild retrolisthesis of T12-L1, L1-L2, L2-L3 and L4-L5. 2.    At L4-L5, degenerative changes cause moderately severe left foraminal narrowing and severe left lateral recess stenosis  with potential for left L4 and left L5 nerve root compression. 3.    At L5-S1, degenerative changes cause moderately severe right lateral recess stenosis with potential for right S1 nerve root compression. 4.    There are milder degenerative changes at the other lumbar levels that do not lead to nerve root compression or spinal stenosis. 5.    Mild scoliosis, convex to the left.       INTERPRETING PHYSICIAN:  Richard A. Felecia Shelling, MD, PhD, FAAN  Imaging: No results found.   PMFS History: Patient Active Problem List   Diagnosis Date Noted   UPJ obstruction, acquired 03/02/2021   Diverticulosis 03/02/2021   Cervical spondylosis 03/02/2021   Unspecified protein-calorie malnutrition ([place]) 03/02/2021   Transaminitis 03/02/2021   Hyponatremia 02/22/2021   Essential hypertension 02/22/2021   Hyperlipidemia 02/22/2021   Fall at home, subsequent encounter 02/22/2021   Need for prophylactic vaccination and inoculation against influenza 06/17/2016   Polycythemia rubra vera ([place]) 09/02/2011   HZ (herpes zoster) 09/02/2011   Past Medical History:  Diagnosis Date   Depression    Hypercholesteremia    Hypertension    HZ (herpes zoster) 09/02/2011    Neuritis    left foot   Polycythemia rubra vera ([place]) 09/02/2011    Family History  Problem Relation Age of Onset   Heart attack Mother     Past Surgical History:  Procedure Laterality Date   GAS INSERTION  06/04/2012   Procedure: INSERTION OF GAS;  Surgeon: Hayden Pedro, MD;  Location: [facility];  Service: Ophthalmology;  Laterality: Left;  [id number]   SCLERAL BUCKLE  06/04/2012   Procedure: SCLERAL BUCKLE;  Surgeon: Hayden Pedro, MD;  Location: [facility];  Service: Ophthalmology;  Laterality: Left;  Headscope laser   Social History   Occupational History    Comment: retired  Tobacco Use   Smoking status: Never   Smokeless tobacco: Never   Tobacco comments:    never used tobacco  Vaping Use   Vaping Use: Never used  Substance and Sexual Activity   Alcohol use: No    Alcohol/week: 0.0 standard drinks of alcohol   Drug use: No   Sexual activity: Not Currently

## 2022-04-18 NOTE — Patient Instructions (Signed)
Avoid bending, stooping and avoid lifting weights greater than 10 lbs. Avoid prolong standing and walking. Avoid frequent bending and stooping  No lifting greater than 10 lbs. May use ice or moist heat for pain. Weight loss is of benefit. Handicap license is approved. Dr. Newton's secretary/Assistant will call to arrange for epidural steroid injection  

## 2022-04-24 ENCOUNTER — Ambulatory Visit: Payer: Medicare Other | Admitting: Physical Medicine and Rehabilitation

## 2022-04-24 ENCOUNTER — Ambulatory Visit: Payer: Self-pay

## 2022-04-24 ENCOUNTER — Encounter: Payer: Self-pay | Admitting: Physical Medicine and Rehabilitation

## 2022-04-24 VITALS — BP 123/70 | HR 93 | Ht 65.0 in | Wt 152.0 lb

## 2022-04-24 DIAGNOSIS — M5416 Radiculopathy, lumbar region: Secondary | ICD-10-CM

## 2022-04-24 MED ORDER — METHYLPREDNISOLONE ACETATE 80 MG/ML IJ SUSP
80.0000 mg | Freq: Once | INTRAMUSCULAR | Status: AC
  Administered 2022-04-24: 80 mg

## 2022-04-24 NOTE — Progress Notes (Signed)
Numeric Pain Rating Scale and Functional Assessment Average Pain 6  No pain today.  In the last MONTH (on 0-10 scale) has pain interfered with the following?  1. General activity like being  able to carry out your everyday physical activities such as walking, climbing stairs, carrying groceries, or moving a chair?  Rating(5)   +Driver, -BT, -Dye Allergies.

## 2022-04-24 NOTE — Patient Instructions (Signed)

## 2022-05-01 ENCOUNTER — Other Ambulatory Visit: Payer: Self-pay | Admitting: Surgery

## 2022-05-01 DIAGNOSIS — M5416 Radiculopathy, lumbar region: Secondary | ICD-10-CM

## 2022-05-02 ENCOUNTER — Ambulatory Visit: Payer: Medicare Other | Admitting: Specialist

## 2022-05-05 NOTE — Progress Notes (Signed)
Jesse [name] - 81 y.o. male MRN [id number]  Date of birth: [birth year]  Office Visit Note: Visit Date: 04/24/2022 PCP: Wenda Low, MD Referred by: Wenda Low, MD  Subjective: Chief Complaint  Patient presents with   Lower Back - Pain   HPI:  Jesse [name] is a 81 y.o. male who comes in today at the request of Dr. Basil Dess for planned Left L4-5 and L5-S1 Lumbar Transforaminal epidural steroid injection with fluoroscopic guidance.  The patient has failed conservative care including home exercise, medications, time and activity modification.  This injection will be diagnostic and hopefully therapeutic.  Please see requesting physician notes for further details and justification.   ROS Otherwise per HPI.  Assessment & Plan: Visit Diagnoses:    ICD-10-CM   1. Lumbar radiculopathy  M54.16 XR C-ARM NO REPORT    Epidural Steroid injection    methylPREDNISolone acetate (DEPO-MEDROL) injection 80 mg      Plan: No additional findings.   Meds & Orders:  Meds ordered this encounter  Medications   methylPREDNISolone acetate (DEPO-MEDROL) injection 80 mg    Orders Placed This Encounter  Procedures   XR C-ARM NO REPORT   Epidural Steroid injection    Follow-up: Return for visit to requesting provider as needed.   Procedures: No procedures performed  Lumbosacral Transforaminal Epidural Steroid Injection - Sub-Pedicular Approach with Fluoroscopic Guidance  Patient: Jesse [name]      Date of Birth: 07-01-1941 MRN: [id number] PCP: Wenda Low, MD      Visit Date: 04/24/2022   Universal Protocol:    Date/Time: 04/24/2022  Consent Given By: the patient  Position: PRONE  Additional Comments: Vital signs were monitored before and after the procedure. Patient was prepped and draped in the usual sterile fashion. The correct patient, procedure, and site was verified.   Injection Procedure Details:   Procedure diagnoses: Lumbar radiculopathy [M54.16]    Meds  Administered:  Meds ordered this encounter  Medications   methylPREDNISolone acetate (DEPO-MEDROL) injection 80 mg    Laterality: Left  Location/Site: L4 and L5  Needle:5.0 in., 22 ga.  Short bevel or Quincke spinal needle  Needle Placement: Transforaminal  Findings:    -Comments: Excellent flow of contrast along the nerve, nerve root and into the epidural space.  Procedure Details: After squaring off the end-plates to get a true AP view, the C-arm was positioned so that an oblique view of the foramen as noted above was visualized. The target area is just inferior to the "nose of the scotty dog" or sub pedicular. The soft tissues overlying this structure were infiltrated with 2-3 ml. of 1% Lidocaine without Epinephrine.  The spinal needle was inserted toward the target using a "trajectory" view along the fluoroscope beam.  Under AP and lateral visualization, the needle was advanced so it did not puncture dura and was located close the 6 O'Clock position of the pedical in AP tracterory. Biplanar projections were used to confirm position. Aspiration was confirmed to be negative for CSF and/or blood. A 1-2 ml. volume of Isovue-250 was injected and flow of contrast was noted at each level. Radiographs were obtained for documentation purposes.   After attaining the desired flow of contrast documented above, a 0.5 to 1.0 ml test dose of 0.25% Marcaine was injected into each respective transforaminal space.  The patient was observed for 90 seconds post injection.  After no sensory deficits were reported, and normal lower extremity motor function was noted,   the above  injectate was administered so that equal amounts of the injectate were placed at each foramen (level) into the transforaminal epidural space.   Additional Comments:  No complications occurred Dressing: 2 x 2 sterile gauze and Band-Aid    Post-procedure details: Patient was observed during the procedure. Post-procedure  instructions were reviewed.  Patient left the clinic in stable condition.    Clinical History: STUDY DATE: 07/14/2020 PATIENT NAME: Jesse [name] DOB: August 26, 1941 MRN: [id number]   EXAM: MRI of the lumbar spine without contrast   ORDERING CLINICIAN: Andrey Spearman MD CLINICAL HISTORY: 81 year old man with left lumbar radiculopathy COMPARISON FILMS: None   TECHNIQUE: MRI of the lumbar spine was obtained utilizing 4 mm sagittal slices from D63-87 down to the lower sacrum with T1, T2 and inversion recovery views. In addition 4 mm axial slices from F6-4 down to L5-S1 level were included with T1 and T2 weighted views. CONTRAST: None IMAGING SITE: [facility], [address] [place], [place], [place]     FINDINGS: On sagittal images, the spine is imaged from T11 to the sacrum.   The conus medullaris and cauda equine appear normal.   There is 1 to 2 mm of retrolisthesis of T12 upon L1, 1 mm retrolisthesis of L1 upon L2, 2 mm retrolisthesis of L2 upon 3 and 1 mm retrolisthesis of L4 upon L5.  There is mild scoliosis, convex to the left.  There is reduced disc height at all of the lumbar levels, most pronounced at L2-L3.  Endplate degenerative changes are noted at L1-L2, L2-L3 and L3-L4.  A Schmorl's node enters the inferior endplate of L1.   The discs and interspaces were further evaluated on axial views from L1 to S1 as follows:   T12-L1: There is minimal retrolisthesis, minimal disc protrusion to the left and endplate spurring.  There is mild left foraminal stenosis but no nerve root compression or spinal stenosis.   L1-L2: There is minimal retrolisthesis, disc bulging.  There is mild lateral recess stenosis but no nerve root compression or spinal stenosis.   L2-L3: There is 2 mm retrolisthesis associated with disc protrusion and endplate spurring.  There is mild foraminal narrowing, mild left lateral recess stenosis and moderate right lateral recess stenosis.  There is no nerve root  compression or spinal stenosis.   L3-L4: There is disc bulging, endplate spurring, mild facet hypertrophy combining to cause moderate bilateral foraminal narrowing and moderate bilateral lateral recess stenosis.  There is no definite nerve root compression though the degenerative changes encroach upon the exiting and traversing nerve roots.   L4-L5: There is minimal retrolisthesis, disc bulging, endplate spurring and moderate ligamenta flava hypertrophy causing moderately severe left foraminal narrowing and severe left lateral recess stenosis.  There is also mild right foraminal narrowing and lateral recess stenosis.  There is potential for left L4 and left L5 nerve root compression.   L5-S1: There is a right paramedian disc protrusion and endplate spurring causing moderate right foraminal narrowing, moderately severe right lateral recess stenosis moderate left foraminal narrowing and moderate left lateral recess stenosis.  There is potential for right S1 nerve root compression.     IMPRESSION: This MRI of the lumbar spine without contrast shows the following: 1.    There is minimal to mild retrolisthesis of T12-L1, L1-L2, L2-L3 and L4-L5. 2.    At L4-L5, degenerative changes cause moderately severe left foraminal narrowing and severe left lateral recess stenosis with potential for left L4 and left L5 nerve root compression. 3.  At L5-S1, degenerative changes cause moderately severe right lateral recess stenosis with potential for right S1 nerve root compression. 4.    There are milder degenerative changes at the other lumbar levels that do not lead to nerve root compression or spinal stenosis. 5.    Mild scoliosis, convex to the left.       INTERPRETING PHYSICIAN:  Richard A. Felecia Shelling, MD, PhD, Charlynn Grimes     Objective:  VS:  HT:'5\' 5"'$  (165.1 cm)   WT:152 lb (68.9 kg)  BMI:25.29    BP:123/70  HR:93bpm  TEMP: ( )  RESP:  Physical Exam Vitals and nursing note reviewed.  Constitutional:       General: He is not in acute distress.    Appearance: Normal appearance. He is not ill-appearing.  HENT:     Head: Normocephalic and atraumatic.     Right Ear: External ear normal.     Left Ear: External ear normal.     Nose: No congestion.  Eyes:     Extraocular Movements: Extraocular movements intact.  Cardiovascular:     Rate and Rhythm: Normal rate.     Pulses: Normal pulses.  Pulmonary:     Effort: Pulmonary effort is normal. No respiratory distress.  Abdominal:     General: There is no distension.     Palpations: Abdomen is soft.  Musculoskeletal:        General: No tenderness or signs of injury.     Cervical back: Neck supple.     Right lower leg: No edema.     Left lower leg: No edema.     Comments: Patient has good distal strength without clonus.  Skin:    Findings: No erythema or rash.  Neurological:     General: No focal deficit present.     Mental Status: He is alert and oriented to person, place, and time.     Sensory: No sensory deficit.     Motor: No weakness or abnormal muscle tone.     Coordination: Coordination normal.  Psychiatric:        Mood and Affect: Mood normal.        Behavior: Behavior normal.      Imaging: No results found.

## 2022-05-05 NOTE — Procedures (Signed)
Lumbosacral Transforaminal Epidural Steroid Injection - Sub-Pedicular Approach with Fluoroscopic Guidance  Patient: Jesse [name]      Date of Birth: [birth year] MRN: [id number] PCP: Wenda Low, MD      Visit Date: 04/24/2022   Universal Protocol:    Date/Time: 04/24/2022  Consent Given By: the patient  Position: PRONE  Additional Comments: Vital signs were monitored before and after the procedure. Patient was prepped and draped in the usual sterile fashion. The correct patient, procedure, and site was verified.   Injection Procedure Details:   Procedure diagnoses: Lumbar radiculopathy [M54.16]    Meds Administered:  Meds ordered this encounter  Medications   methylPREDNISolone acetate (DEPO-MEDROL) injection 80 mg    Laterality: Left  Location/Site: L4 and L5  Needle:5.0 in., 22 ga.  Short bevel or Quincke spinal needle  Needle Placement: Transforaminal  Findings:    -Comments: Excellent flow of contrast along the nerve, nerve root and into the epidural space.  Procedure Details: After squaring off the end-plates to get a true AP view, the C-arm was positioned so that an oblique view of the foramen as noted above was visualized. The target area is just inferior to the "nose of the scotty dog" or sub pedicular. The soft tissues overlying this structure were infiltrated with 2-3 ml. of 1% Lidocaine without Epinephrine.  The spinal needle was inserted toward the target using a "trajectory" view along the fluoroscope beam.  Under AP and lateral visualization, the needle was advanced so it did not puncture dura and was located close the 6 O'Clock position of the pedical in AP tracterory. Biplanar projections were used to confirm position. Aspiration was confirmed to be negative for CSF and/or blood. A 1-2 ml. volume of Isovue-250 was injected and flow of contrast was noted at each level. Radiographs were obtained for documentation purposes.   After attaining the desired  flow of contrast documented above, a 0.5 to 1.0 ml test dose of 0.25% Marcaine was injected into each respective transforaminal space.  The patient was observed for 90 seconds post injection.  After no sensory deficits were reported, and normal lower extremity motor function was noted,   the above injectate was administered so that equal amounts of the injectate were placed at each foramen (level) into the transforaminal epidural space.   Additional Comments:  No complications occurred Dressing: 2 x 2 sterile gauze and Band-Aid    Post-procedure details: Patient was observed during the procedure. Post-procedure instructions were reviewed.  Patient left the clinic in stable condition.

## 2022-05-20 ENCOUNTER — Ambulatory Visit: Payer: Medicare Other | Admitting: Physical Medicine and Rehabilitation

## 2022-05-20 ENCOUNTER — Ambulatory Visit: Payer: Self-pay

## 2022-05-20 ENCOUNTER — Encounter: Payer: Self-pay | Admitting: Physical Medicine and Rehabilitation

## 2022-05-20 VITALS — BP 132/82 | HR 76

## 2022-05-20 DIAGNOSIS — M5416 Radiculopathy, lumbar region: Secondary | ICD-10-CM | POA: Diagnosis not present

## 2022-05-20 MED ORDER — METHYLPREDNISOLONE ACETATE 80 MG/ML IJ SUSP
80.0000 mg | Freq: Once | INTRAMUSCULAR | Status: AC
  Administered 2022-05-20: 80 mg

## 2022-05-20 NOTE — Progress Notes (Signed)
Pt state lower back pain that travels to his left leg. Pt state bending makes the pain worse. Pt state he takes pain meds to help ease his pain.  Numeric Pain Rating Scale and Functional Assessment Average Pain 4   In the last MONTH (on 0-10 scale) has pain interfered with the following?  1. General activity like being  able to carry out your everyday physical activities such as walking, climbing stairs, carrying groceries, or moving a chair?  Rating(8)   +Driver, -BT, -Dye Allergies.

## 2022-05-20 NOTE — Procedures (Signed)
Lumbosacral Transforaminal Epidural Steroid Injection - Sub-Pedicular Approach with Fluoroscopic Guidance  Patient: Jesse [name]      Date of Birth: 13-Sep-1941 MRN: [id number] PCP: Wenda Low, MD      Visit Date: 05/20/2022   Universal Protocol:    Date/Time: 05/20/2022  Consent Given By: the patient  Position: PRONE  Additional Comments: Vital signs were monitored before and after the procedure. Patient was prepped and draped in the usual sterile fashion. The correct patient, procedure, and site was verified.   Injection Procedure Details:   Procedure diagnoses: Lumbar radiculopathy [M54.16]    Meds Administered:  Meds ordered this encounter  Medications   methylPREDNISolone acetate (DEPO-MEDROL) injection 80 mg    Laterality: Left  Location/Site: L4 and L5  Needle:5.0 in., 22 ga.  Short bevel or Quincke spinal needle  Needle Placement: Transforaminal  Findings:    -Comments: Excellent flow of contrast along the nerve, nerve root and into the epidural space.  Procedure Details: After squaring off the end-plates to get a true AP view, the C-arm was positioned so that an oblique view of the foramen as noted above was visualized. The target area is just inferior to the "nose of the scotty dog" or sub pedicular. The soft tissues overlying this structure were infiltrated with 2-3 ml. of 1% Lidocaine without Epinephrine.  The spinal needle was inserted toward the target using a "trajectory" view along the fluoroscope beam.  Under AP and lateral visualization, the needle was advanced so it did not puncture dura and was located close the 6 O'Clock position of the pedical in AP tracterory. Biplanar projections were used to confirm position. Aspiration was confirmed to be negative for CSF and/or blood. A 1-2 ml. volume of Isovue-250 was injected and flow of contrast was noted at each level. Radiographs were obtained for documentation purposes.   After attaining the desired  flow of contrast documented above, a 0.5 to 1.0 ml test dose of 0.25% Marcaine was injected into each respective transforaminal space.  The patient was observed for 90 seconds post injection.  After no sensory deficits were reported, and normal lower extremity motor function was noted,   the above injectate was administered so that equal amounts of the injectate were placed at each foramen (level) into the transforaminal epidural space.   Additional Comments:  The patient tolerated the procedure well Dressing: 2 x 2 sterile gauze and Band-Aid    Post-procedure details: Patient was observed during the procedure. Post-procedure instructions were reviewed.  Patient left the clinic in stable condition.

## 2022-05-20 NOTE — Patient Instructions (Signed)

## 2022-05-20 NOTE — Progress Notes (Signed)
Jesse [name] - 81 y.o. male MRN [id number]  Date of birth: 1941-03-31  Office Visit Note: Visit Date: 05/20/2022 PCP: Wenda Low, MD Referred by: Wenda Low, MD  Subjective: Chief Complaint  Patient presents with   Lower Back - Pain   Left Leg - Pain   HPI:  Jesse [name] is a 81 y.o. male who comes in today for planned repeat Left L4-5 and L5-S1  Lumbar Transforaminal epidural steroid injection with fluoroscopic guidance.  The patient has failed conservative care including home exercise, medications, time and activity modification.  This injection will be diagnostic and hopefully therapeutic.  Please see requesting physician notes for further details and justification. Patient received more than 50% pain relief from prior injection.   Referring: Dr. Basil Dess   ROS Otherwise per HPI.  Assessment & Plan: Visit Diagnoses:    ICD-10-CM   1. Lumbar radiculopathy  M54.16 XR C-ARM NO REPORT    Epidural Steroid injection    methylPREDNISolone acetate (DEPO-MEDROL) injection 80 mg      Plan: No additional findings.   Meds & Orders:  Meds ordered this encounter  Medications   methylPREDNISolone acetate (DEPO-MEDROL) injection 80 mg    Orders Placed This Encounter  Procedures   XR C-ARM NO REPORT   Epidural Steroid injection    Follow-up: Return if symptoms worsen or fail to improve.   Procedures: No procedures performed  Lumbosacral Transforaminal Epidural Steroid Injection - Sub-Pedicular Approach with Fluoroscopic Guidance  Patient: Jesse [name]      Date of Birth: [birth year] MRN: [id number] PCP: Wenda Low, MD      Visit Date: 05/20/2022   Universal Protocol:    Date/Time: 05/20/2022  Consent Given By: the patient  Position: PRONE  Additional Comments: Vital signs were monitored before and after the procedure. Patient was prepped and draped in the usual sterile fashion. The correct patient, procedure, and site was verified.   Injection  Procedure Details:   Procedure diagnoses: Lumbar radiculopathy [M54.16]    Meds Administered:  Meds ordered this encounter  Medications   methylPREDNISolone acetate (DEPO-MEDROL) injection 80 mg    Laterality: Left  Location/Site: L4 and L5  Needle:5.0 in., 22 ga.  Short bevel or Quincke spinal needle  Needle Placement: Transforaminal  Findings:    -Comments: Excellent flow of contrast along the nerve, nerve root and into the epidural space.  Procedure Details: After squaring off the end-plates to get a true AP view, the C-arm was positioned so that an oblique view of the foramen as noted above was visualized. The target area is just inferior to the "nose of the scotty dog" or sub pedicular. The soft tissues overlying this structure were infiltrated with 2-3 ml. of 1% Lidocaine without Epinephrine.  The spinal needle was inserted toward the target using a "trajectory" view along the fluoroscope beam.  Under AP and lateral visualization, the needle was advanced so it did not puncture dura and was located close the 6 O'Clock position of the pedical in AP tracterory. Biplanar projections were used to confirm position. Aspiration was confirmed to be negative for CSF and/or blood. A 1-2 ml. volume of Isovue-250 was injected and flow of contrast was noted at each level. Radiographs were obtained for documentation purposes.   After attaining the desired flow of contrast documented above, a 0.5 to 1.0 ml test dose of 0.25% Marcaine was injected into each respective transforaminal space.  The patient was observed for 90 seconds post injection.  After  no sensory deficits were reported, and normal lower extremity motor function was noted,   the above injectate was administered so that equal amounts of the injectate were placed at each foramen (level) into the transforaminal epidural space.   Additional Comments:  The patient tolerated the procedure well Dressing: 2 x 2 sterile gauze and  Band-Aid    Post-procedure details: Patient was observed during the procedure. Post-procedure instructions were reviewed.  Patient left the clinic in stable condition.    Clinical History: STUDY DATE: 07/14/2020 PATIENT NAME: Jesse [name] DOB: November 02, 1940 MRN: [id number]   EXAM: MRI of the lumbar spine without contrast   ORDERING CLINICIAN: Andrey Spearman MD CLINICAL HISTORY: 81 year old man with left lumbar radiculopathy COMPARISON FILMS: None   TECHNIQUE: MRI of the lumbar spine was obtained utilizing 4 mm sagittal slices from J18-84 down to the lower sacrum with T1, T2 and inversion recovery views. In addition 4 mm axial slices from Z6-6 down to L5-S1 level were included with T1 and T2 weighted views. CONTRAST: None IMAGING SITE: [facility], [address] [place], [place], [place]     FINDINGS: On sagittal images, the spine is imaged from T11 to the sacrum.   The conus medullaris and cauda equine appear normal.   There is 1 to 2 mm of retrolisthesis of T12 upon L1, 1 mm retrolisthesis of L1 upon L2, 2 mm retrolisthesis of L2 upon 3 and 1 mm retrolisthesis of L4 upon L5.  There is mild scoliosis, convex to the left.  There is reduced disc height at all of the lumbar levels, most pronounced at L2-L3.  Endplate degenerative changes are noted at L1-L2, L2-L3 and L3-L4.  A Schmorl's node enters the inferior endplate of L1.   The discs and interspaces were further evaluated on axial views from L1 to S1 as follows:   T12-L1: There is minimal retrolisthesis, minimal disc protrusion to the left and endplate spurring.  There is mild left foraminal stenosis but no nerve root compression or spinal stenosis.   L1-L2: There is minimal retrolisthesis, disc bulging.  There is mild lateral recess stenosis but no nerve root compression or spinal stenosis.   L2-L3: There is 2 mm retrolisthesis associated with disc protrusion and endplate spurring.  There is mild foraminal narrowing, mild left  lateral recess stenosis and moderate right lateral recess stenosis.  There is no nerve root compression or spinal stenosis.   L3-L4: There is disc bulging, endplate spurring, mild facet hypertrophy combining to cause moderate bilateral foraminal narrowing and moderate bilateral lateral recess stenosis.  There is no definite nerve root compression though the degenerative changes encroach upon the exiting and traversing nerve roots.   L4-L5: There is minimal retrolisthesis, disc bulging, endplate spurring and moderate ligamenta flava hypertrophy causing moderately severe left foraminal narrowing and severe left lateral recess stenosis.  There is also mild right foraminal narrowing and lateral recess stenosis.  There is potential for left L4 and left L5 nerve root compression.   L5-S1: There is a right paramedian disc protrusion and endplate spurring causing moderate right foraminal narrowing, moderately severe right lateral recess stenosis moderate left foraminal narrowing and moderate left lateral recess stenosis.  There is potential for right S1 nerve root compression.     IMPRESSION: This MRI of the lumbar spine without contrast shows the following: 1.    There is minimal to mild retrolisthesis of T12-L1, L1-L2, L2-L3 and L4-L5. 2.    At L4-L5, degenerative changes cause moderately severe left foraminal narrowing and  severe left lateral recess stenosis with potential for left L4 and left L5 nerve root compression. 3.    At L5-S1, degenerative changes cause moderately severe right lateral recess stenosis with potential for right S1 nerve root compression. 4.    There are milder degenerative changes at the other lumbar levels that do not lead to nerve root compression or spinal stenosis. 5.    Mild scoliosis, convex to the left.       INTERPRETING PHYSICIAN:  Richard A. Felecia Shelling, MD, PhD, Charlynn Grimes     Objective:  VS:  HT:    WT:   BMI:     BP:132/82  HR:76bpm  TEMP: ( )  RESP:  Physical  Exam Vitals and nursing note reviewed.  Constitutional:      General: He is not in acute distress.    Appearance: Normal appearance. He is not ill-appearing.  HENT:     Head: Normocephalic and atraumatic.     Right Ear: External ear normal.     Left Ear: External ear normal.     Nose: No congestion.  Eyes:     Extraocular Movements: Extraocular movements intact.  Cardiovascular:     Rate and Rhythm: Normal rate.     Pulses: Normal pulses.  Pulmonary:     Effort: Pulmonary effort is normal. No respiratory distress.  Abdominal:     General: There is no distension.     Palpations: Abdomen is soft.  Musculoskeletal:        General: No tenderness or signs of injury.     Cervical back: Neck supple.     Right lower leg: No edema.     Left lower leg: No edema.     Comments: Patient has good distal strength without clonus.  Skin:    Findings: No erythema or rash.  Neurological:     General: No focal deficit present.     Mental Status: He is alert and oriented to person, place, and time.     Sensory: No sensory deficit.     Motor: No weakness or abnormal muscle tone.     Coordination: Coordination normal.  Psychiatric:        Mood and Affect: Mood normal.        Behavior: Behavior normal.      Imaging: No results found.

## 2022-05-31 DIAGNOSIS — E7800 Pure hypercholesterolemia, unspecified: Secondary | ICD-10-CM | POA: Diagnosis not present

## 2022-05-31 DIAGNOSIS — E538 Deficiency of other specified B group vitamins: Secondary | ICD-10-CM | POA: Diagnosis not present

## 2022-05-31 DIAGNOSIS — I10 Essential (primary) hypertension: Secondary | ICD-10-CM | POA: Diagnosis not present

## 2022-05-31 DIAGNOSIS — Z0000 Encounter for general adult medical examination without abnormal findings: Secondary | ICD-10-CM | POA: Diagnosis not present

## 2022-05-31 DIAGNOSIS — I700 Atherosclerosis of aorta: Secondary | ICD-10-CM | POA: Diagnosis not present

## 2022-05-31 DIAGNOSIS — Z23 Encounter for immunization: Secondary | ICD-10-CM | POA: Diagnosis not present

## 2022-05-31 DIAGNOSIS — M519 Unspecified thoracic, thoracolumbar and lumbosacral intervertebral disc disorder: Secondary | ICD-10-CM | POA: Diagnosis not present

## 2022-05-31 DIAGNOSIS — K5900 Constipation, unspecified: Secondary | ICD-10-CM | POA: Diagnosis not present

## 2022-05-31 DIAGNOSIS — E871 Hypo-osmolality and hyponatremia: Secondary | ICD-10-CM | POA: Diagnosis not present

## 2022-05-31 DIAGNOSIS — L570 Actinic keratosis: Secondary | ICD-10-CM | POA: Diagnosis not present

## 2022-05-31 DIAGNOSIS — E559 Vitamin D deficiency, unspecified: Secondary | ICD-10-CM | POA: Diagnosis not present

## 2022-05-31 DIAGNOSIS — D45 Polycythemia vera: Secondary | ICD-10-CM | POA: Diagnosis not present

## 2022-06-11 ENCOUNTER — Inpatient Hospital Stay: Payer: Medicare Other

## 2022-06-11 ENCOUNTER — Inpatient Hospital Stay: Payer: Medicare Other | Attending: Family

## 2022-06-11 DIAGNOSIS — D751 Secondary polycythemia: Secondary | ICD-10-CM | POA: Diagnosis not present

## 2022-06-11 DIAGNOSIS — D500 Iron deficiency anemia secondary to blood loss (chronic): Secondary | ICD-10-CM

## 2022-06-11 LAB — CMP (CANCER CENTER ONLY)
ALT: 21 U/L (ref 0–44)
AST: 16 U/L (ref 15–41)
Albumin: 4.2 g/dL (ref 3.5–5.0)
Alkaline Phosphatase: 45 U/L (ref 38–126)
Anion gap: 9 (ref 5–15)
BUN: 18 mg/dL (ref 8–23)
CO2: 25 mmol/L (ref 22–32)
Calcium: 10.1 mg/dL (ref 8.9–10.3)
Chloride: 96 mmol/L — ABNORMAL LOW (ref 98–111)
Creatinine: 0.82 mg/dL (ref 0.61–1.24)
GFR, Estimated: >60 mL/min (ref >60)
Glucose, Bld: 103 mg/dL — ABNORMAL HIGH (ref 70–99)
Potassium: 4.3 mmol/L (ref 3.5–5.1)
Sodium: 130 mmol/L — ABNORMAL LOW (ref 135–145)
Total Bilirubin: 2.3 mg/dL — ABNORMAL HIGH (ref 0.3–1.2)
Total Protein: 6.8 g/dL (ref 6.5–8.1)

## 2022-06-11 LAB — CBC WITH DIFFERENTIAL (CANCER CENTER ONLY)
Abs Immature Granulocytes: 0.02 10*3/uL (ref 0.00–0.07)
Basophils Absolute: 0 10*3/uL (ref 0.0–0.1)
Basophils Relative: 0 %
Eosinophils Absolute: 0.1 10*3/uL (ref 0.0–0.5)
Eosinophils Relative: 1 %
HCT: 49.5 % (ref 39.0–52.0)
Hemoglobin: 16.6 g/dL (ref 13.0–17.0)
Immature Granulocytes: 0 %
Lymphocytes Relative: 25 %
Lymphs Abs: 1.8 10*3/uL (ref 0.7–4.0)
MCH: 31.4 pg (ref 26.0–34.0)
MCHC: 33.5 g/dL (ref 30.0–36.0)
MCV: 93.6 fL (ref 80.0–100.0)
Monocytes Absolute: 1.1 10*3/uL — ABNORMAL HIGH (ref 0.1–1.0)
Monocytes Relative: 15 %
Neutro Abs: 4.1 10*3/uL (ref 1.7–7.7)
Neutrophils Relative %: 59 %
Platelet Count: 218 10*3/uL (ref 150–400)
RBC: 5.29 MIL/uL (ref 4.22–5.81)
RDW: 14.6 % (ref 11.5–15.5)
WBC Count: 7 10*3/uL (ref 4.0–10.5)
nRBC: 0 % (ref 0.0–0.2)

## 2022-06-11 NOTE — Progress Notes (Signed)
Jesse [name] presents today for phlebotomy per MD orders. Phlebotomy procedure started at 1015 via 16 g phlebotomy kit and ended at 1025. 501 grams removed. Patient observed for 30 minutes after procedure without any incident. Patient tolerated procedure well. IV needle removed intact.

## 2022-06-11 NOTE — Patient Instructions (Signed)

## 2022-06-14 ENCOUNTER — Encounter: Payer: Self-pay | Admitting: Specialist

## 2022-06-14 ENCOUNTER — Ambulatory Visit: Payer: Medicare Other | Admitting: Specialist

## 2022-06-14 VITALS — BP 120/71 | HR 71 | Ht 65.0 in | Wt 152.0 lb

## 2022-06-14 DIAGNOSIS — M47812 Spondylosis without myelopathy or radiculopathy, cervical region: Secondary | ICD-10-CM

## 2022-06-14 DIAGNOSIS — M4726 Other spondylosis with radiculopathy, lumbar region: Secondary | ICD-10-CM | POA: Diagnosis not present

## 2022-06-14 DIAGNOSIS — M48062 Spinal stenosis, lumbar region with neurogenic claudication: Secondary | ICD-10-CM

## 2022-06-14 DIAGNOSIS — M5412 Radiculopathy, cervical region: Secondary | ICD-10-CM | POA: Diagnosis not present

## 2022-06-14 DIAGNOSIS — M5416 Radiculopathy, lumbar region: Secondary | ICD-10-CM | POA: Diagnosis not present

## 2022-06-14 DIAGNOSIS — M545 Low back pain, unspecified: Secondary | ICD-10-CM

## 2022-06-14 DIAGNOSIS — M4156 Other secondary scoliosis, lumbar region: Secondary | ICD-10-CM | POA: Diagnosis not present

## 2022-06-14 MED ORDER — MELOXICAM 15 MG PO TABS: 15.0000 mg | ORAL_TABLET | Freq: Every day | ORAL | 1 refills | Status: DC

## 2022-06-14 MED ORDER — PREGABALIN 50 MG PO CAPS
50.0000 mg | ORAL_CAPSULE | Freq: Every day | ORAL | 0 refills | Status: DC
Start: 2022-06-14 — End: 2022-12-12

## 2022-06-14 NOTE — Progress Notes (Signed)
Office Visit Note   Patient: Jesse [name]           Date of Birth: 06/17/1941           MRN: [id number] Visit Date: 06/14/2022              Requested by: Wenda Low, MD [address] [place],  [place] [address] PCP: Wenda Low, MD   Assessment & Plan: Visit Diagnoses:  1. Lumbar radiculopathy   2. Other secondary scoliosis, lumbar region   3. Spinal stenosis, lumbar region, with neurogenic claudication   4. Chronic cervical radiculopathy   5. Cervical spondylosis   6. Other spondylosis with radiculopathy, lumbar region   7. Low back pain, unspecified back pain laterality, unspecified chronicity, unspecified whether sciatica present     Plan: Avoid bending, stooping and avoid lifting weights greater than 10 lbs. Avoid prolong standing and walking. Avoid frequent bending and stooping  No lifting greater than 10 lbs. May use ice or moist heat for pain. Weight loss is of benefit. Handicap license is approved. Dr. Romona Curls secretary/Assistant will call to arrange for epidural steroid injection   Continue with meloxicam 15 mg daily with meal or snack Lyrica for neurogenic pain  one at night for nerve pain.   Follow-Up Instructions: No follow-ups on file.   Orders:  No orders of the defined types were placed in this encounter.  Meds ordered this encounter  Medications   pregabalin (LYRICA) 50 MG capsule    Sig: Take 1 capsule (50 mg total) by mouth at bedtime.    Dispense:  30 capsule    Refill:  0   meloxicam (MOBIC) 15 MG tablet    Sig: Take 1 tablet (15 mg total) by mouth daily.    Dispense:  90 tablet    Refill:  1      Procedures: No procedures performed   Clinical Data: No additional findings.   Subjective: Chief Complaint  Patient presents with   Lower Back - Follow-up    Had a Left L4 & L5 TF injection, states that he got 75% relief from the injection    81 year old male with history of collapsing degenerative scoliosis  and  spondylosis with left low back and buttock and leg pain. He related left L3 and L4 TF ESI. These have helped and he is scheduled for futher injection this coming Monday.    Review of Systems   Objective: Vital Signs: BP 120/71 (BP Location: Left Arm, Patient Position: Sitting)   Pulse 71   Ht '5\' 5"'$  (1.651 m)   Wt 152 lb (68.9 kg)   BMI 25.29 kg/m   Physical Exam  Ortho Exam  Specialty Comments:  STUDY DATE: 07/14/2020 PATIENT NAME: Jesse [name] DOB: 1941/01/28 MRN: [id number]   EXAM: MRI of the lumbar spine without contrast   ORDERING CLINICIAN: Andrey Spearman MD CLINICAL HISTORY: 81 year old man with left lumbar radiculopathy COMPARISON FILMS: None   TECHNIQUE: MRI of the lumbar spine was obtained utilizing 4 mm sagittal slices from J00-93 down to the lower sacrum with T1, T2 and inversion recovery views. In addition 4 mm axial slices from G1-8 down to L5-S1 level were included with T1 and T2 weighted views. CONTRAST: None IMAGING SITE: [facility], [address] [place], [place], [place]     FINDINGS: On sagittal images, the spine is imaged from T11 to the sacrum.   The conus medullaris and cauda equine appear normal.   There is  1 to 2 mm of retrolisthesis of T12 upon L1, 1 mm retrolisthesis of L1 upon L2, 2 mm retrolisthesis of L2 upon 3 and 1 mm retrolisthesis of L4 upon L5.  There is mild scoliosis, convex to the left.  There is reduced disc height at all of the lumbar levels, most pronounced at L2-L3.  Endplate degenerative changes are noted at L1-L2, L2-L3 and L3-L4.  A Schmorl's node enters the inferior endplate of L1.   The discs and interspaces were further evaluated on axial views from L1 to S1 as follows:   T12-L1: There is minimal retrolisthesis, minimal disc protrusion to the left and endplate spurring.  There is mild left foraminal stenosis but no nerve root compression or spinal stenosis.   L1-L2: There is minimal retrolisthesis, disc bulging.  There is  mild lateral recess stenosis but no nerve root compression or spinal stenosis.   L2-L3: There is 2 mm retrolisthesis associated with disc protrusion and endplate spurring.  There is mild foraminal narrowing, mild left lateral recess stenosis and moderate right lateral recess stenosis.  There is no nerve root compression or spinal stenosis.   L3-L4: There is disc bulging, endplate spurring, mild facet hypertrophy combining to cause moderate bilateral foraminal narrowing and moderate bilateral lateral recess stenosis.  There is no definite nerve root compression though the degenerative changes encroach upon the exiting and traversing nerve roots.   L4-L5: There is minimal retrolisthesis, disc bulging, endplate spurring and moderate ligamenta flava hypertrophy causing moderately severe left foraminal narrowing and severe left lateral recess stenosis.  There is also mild right foraminal narrowing and lateral recess stenosis.  There is potential for left L4 and left L5 nerve root compression.   L5-S1: There is a right paramedian disc protrusion and endplate spurring causing moderate right foraminal narrowing, moderately severe right lateral recess stenosis moderate left foraminal narrowing and moderate left lateral recess stenosis.  There is potential for right S1 nerve root compression.     IMPRESSION: This MRI of the lumbar spine without contrast shows the following: 1.    There is minimal to mild retrolisthesis of T12-L1, L1-L2, L2-L3 and L4-L5. 2.    At L4-L5, degenerative changes cause moderately severe left foraminal narrowing and severe left lateral recess stenosis with potential for left L4 and left L5 nerve root compression. 3.    At L5-S1, degenerative changes cause moderately severe right lateral recess stenosis with potential for right S1 nerve root compression. 4.    There are milder degenerative changes at the other lumbar levels that do not lead to nerve root compression or spinal  stenosis. 5.    Mild scoliosis, convex to the left.       INTERPRETING PHYSICIAN:  Richard A. Felecia Shelling, MD, PhD, FAAN  Imaging: No results found.   PMFS History: Patient Active Problem List   Diagnosis Date Noted   UPJ obstruction, acquired 03/02/2021   Diverticulosis 03/02/2021   Cervical spondylosis 03/02/2021   Unspecified protein-calorie malnutrition ([place]) 03/02/2021   Transaminitis 03/02/2021   Hyponatremia 02/22/2021   Essential hypertension 02/22/2021   Hyperlipidemia 02/22/2021   Fall at home, subsequent encounter 02/22/2021   Need for prophylactic vaccination and inoculation against influenza 06/17/2016   Polycythemia rubra vera ([facility]) 09/02/2011   HZ (herpes zoster) 09/02/2011   Past Medical History:  Diagnosis Date   Depression    Hypercholesteremia    Hypertension    HZ (herpes zoster) 09/02/2011   Neuritis    left foot   Polycythemia rubra  vera ([facility]) 09/02/2011    Family History  Problem Relation Age of Onset   Heart attack Mother     Past Surgical History:  Procedure Laterality Date   GAS INSERTION  06/04/2012   Procedure: INSERTION OF GAS;  Surgeon: Hayden Pedro, MD;  Location: [facility];  Service: Ophthalmology;  Laterality: Left;  [id number]   SCLERAL BUCKLE  06/04/2012   Procedure: SCLERAL BUCKLE;  Surgeon: Hayden Pedro, MD;  Location: [facility];  Service: Ophthalmology;  Laterality: Left;  Headscope laser   Social History   Occupational History    Comment: retired  Tobacco Use   Smoking status: Never   Smokeless tobacco: Never   Tobacco comments:    never used tobacco  Vaping Use   Vaping Use: Never used  Substance and Sexual Activity   Alcohol use: No    Alcohol/week: 0.0 standard drinks of alcohol   Drug use: No   Sexual activity: Not Currently

## 2022-06-14 NOTE — Patient Instructions (Signed)
Plan: Avoid bending, stooping and avoid lifting weights greater than 10 lbs. Avoid prolong standing and walking. Avoid frequent bending and stooping  No lifting greater than 10 lbs. May use ice or moist heat for pain. Weight loss is of benefit. Handicap license is approved. Dr. Romona Curls secretary/Assistant will call to arrange for epidural steroid injection   Continue with meloxicam 15 mg daily with meal or snack Lyrica for neurogenic pain  one at night for nerve pain.

## 2022-06-17 ENCOUNTER — Ambulatory Visit: Payer: Medicare Other | Admitting: Physical Medicine and Rehabilitation

## 2022-06-17 ENCOUNTER — Ambulatory Visit: Payer: Self-pay

## 2022-06-17 DIAGNOSIS — M5416 Radiculopathy, lumbar region: Secondary | ICD-10-CM | POA: Diagnosis not present

## 2022-06-17 MED ORDER — METHYLPREDNISOLONE ACETATE 80 MG/ML IJ SUSP
40.0000 mg | Freq: Once | INTRAMUSCULAR | Status: AC
  Administered 2022-06-17: 40 mg

## 2022-06-17 NOTE — Progress Notes (Signed)
Jesse [name] - 81 y.o. male MRN [id number]  Date of birth: [birth year]  Office Visit Note: Visit Date: 06/17/2022 PCP: Wenda Low, MD Referred by: Wenda Low, MD  Subjective: Chief Complaint  Patient presents with   Lower Back - Pain   HPI:  Jesse [name] is a 81 y.o. male who comes in today for planned repeat Left L4-5 and L5-S1  Lumbar Transforaminal epidural steroid injection with fluoroscopic guidance.  The patient has failed conservative care including home exercise, medications, time and activity modification.  This injection will be diagnostic and hopefully therapeutic.  Please see requesting physician notes for further details and justification. Patient received more than 50% pain relief from prior injection.   Referring: Dr. Basil Dess   ROS Otherwise per HPI.  Assessment & Plan: Visit Diagnoses:    ICD-10-CM   1. Lumbar radiculopathy  M54.16 XR C-ARM NO REPORT    Epidural Steroid injection    methylPREDNISolone acetate (DEPO-MEDROL) injection 40 mg      Plan: No additional findings.   Meds & Orders:  Meds ordered this encounter  Medications   methylPREDNISolone acetate (DEPO-MEDROL) injection 40 mg    Orders Placed This Encounter  Procedures   XR C-ARM NO REPORT   Epidural Steroid injection    Follow-up: Return for visit to requesting provider as needed.   Procedures: No procedures performed  Lumbosacral Transforaminal Epidural Steroid Injection - Sub-Pedicular Approach with Fluoroscopic Guidance  Patient: Jesse [name]      Date of Birth: Jan 26, 1941 MRN: [id number] PCP: Wenda Low, MD      Visit Date: 06/17/2022   Universal Protocol:    Date/Time: 06/17/2022  Consent Given By: the patient  Position: PRONE  Additional Comments: Vital signs were monitored before and after the procedure. Patient was prepped and draped in the usual sterile fashion. The correct patient, procedure, and site was verified.   Injection Procedure  Details:   Procedure diagnoses: Lumbar radiculopathy [M54.16]    Meds Administered:  Meds ordered this encounter  Medications   methylPREDNISolone acetate (DEPO-MEDROL) injection 40 mg    Laterality: Left  Location/Site: L4 and L5  Needle:5.0 in., 22 ga.  Short bevel or Quincke spinal needle  Needle Placement: Transforaminal  Findings:    -Comments: Excellent flow of contrast along the nerve, nerve root and into the epidural space.  Procedure Details: After squaring off the end-plates to get a true AP view, the C-arm was positioned so that an oblique view of the foramen as noted above was visualized. The target area is just inferior to the "nose of the scotty dog" or sub pedicular. The soft tissues overlying this structure were infiltrated with 2-3 ml. of 1% Lidocaine without Epinephrine.  The spinal needle was inserted toward the target using a "trajectory" view along the fluoroscope beam.  Under AP and lateral visualization, the needle was advanced so it did not puncture dura and was located close the 6 O'Clock position of the pedical in AP tracterory. Biplanar projections were used to confirm position. Aspiration was confirmed to be negative for CSF and/or blood. A 1-2 ml. volume of Isovue-250 was injected and flow of contrast was noted at each level. Radiographs were obtained for documentation purposes.   After attaining the desired flow of contrast documented above, a 0.5 to 1.0 ml test dose of 0.25% Marcaine was injected into each respective transforaminal space.  The patient was observed for 90 seconds post injection.  After no sensory deficits were reported, and  normal lower extremity motor function was noted,   the above injectate was administered so that equal amounts of the injectate were placed at each foramen (level) into the transforaminal epidural space.   Additional Comments:  The patient tolerated the procedure well Dressing: 2 x 2 sterile gauze and Band-Aid     Post-procedure details: Patient was observed during the procedure. Post-procedure instructions were reviewed.  Patient left the clinic in stable condition.    Clinical History: STUDY DATE: 07/14/2020 PATIENT NAME: Jesse [name] DOB: [birth year] MRN: [id number]   EXAM: MRI of the lumbar spine without contrast   ORDERING CLINICIAN: Andrey Spearman MD CLINICAL HISTORY: 81 year old man with left lumbar radiculopathy COMPARISON FILMS: None   TECHNIQUE: MRI of the lumbar spine was obtained utilizing 4 mm sagittal slices from Y65-03 down to the lower sacrum with T1, T2 and inversion recovery views. In addition 4 mm axial slices from T4-6 down to L5-S1 level were included with T1 and T2 weighted views. CONTRAST: None IMAGING SITE: [facility], [address] [place], [place], [place]     FINDINGS: On sagittal images, the spine is imaged from T11 to the sacrum.   The conus medullaris and cauda equine appear normal.   There is 1 to 2 mm of retrolisthesis of T12 upon L1, 1 mm retrolisthesis of L1 upon L2, 2 mm retrolisthesis of L2 upon 3 and 1 mm retrolisthesis of L4 upon L5.  There is mild scoliosis, convex to the left.  There is reduced disc height at all of the lumbar levels, most pronounced at L2-L3.  Endplate degenerative changes are noted at L1-L2, L2-L3 and L3-L4.  A Schmorl's node enters the inferior endplate of L1.   The discs and interspaces were further evaluated on axial views from L1 to S1 as follows:   T12-L1: There is minimal retrolisthesis, minimal disc protrusion to the left and endplate spurring.  There is mild left foraminal stenosis but no nerve root compression or spinal stenosis.   L1-L2: There is minimal retrolisthesis, disc bulging.  There is mild lateral recess stenosis but no nerve root compression or spinal stenosis.   L2-L3: There is 2 mm retrolisthesis associated with disc protrusion and endplate spurring.  There is mild foraminal narrowing, mild left lateral  recess stenosis and moderate right lateral recess stenosis.  There is no nerve root compression or spinal stenosis.   L3-L4: There is disc bulging, endplate spurring, mild facet hypertrophy combining to cause moderate bilateral foraminal narrowing and moderate bilateral lateral recess stenosis.  There is no definite nerve root compression though the degenerative changes encroach upon the exiting and traversing nerve roots.   L4-L5: There is minimal retrolisthesis, disc bulging, endplate spurring and moderate ligamenta flava hypertrophy causing moderately severe left foraminal narrowing and severe left lateral recess stenosis.  There is also mild right foraminal narrowing and lateral recess stenosis.  There is potential for left L4 and left L5 nerve root compression.   L5-S1: There is a right paramedian disc protrusion and endplate spurring causing moderate right foraminal narrowing, moderately severe right lateral recess stenosis moderate left foraminal narrowing and moderate left lateral recess stenosis.  There is potential for right S1 nerve root compression.     IMPRESSION: This MRI of the lumbar spine without contrast shows the following: 1.    There is minimal to mild retrolisthesis of T12-L1, L1-L2, L2-L3 and L4-L5. 2.    At L4-L5, degenerative changes cause moderately severe left foraminal narrowing and severe left lateral recess stenosis with  potential for left L4 and left L5 nerve root compression. 3.    At L5-S1, degenerative changes cause moderately severe right lateral recess stenosis with potential for right S1 nerve root compression. 4.    There are milder degenerative changes at the other lumbar levels that do not lead to nerve root compression or spinal stenosis. 5.    Mild scoliosis, convex to the left.       INTERPRETING PHYSICIAN:  Richard A. Felecia Shelling, MD, PhD, Charlynn Grimes     Objective:  VS:  HT:    WT:   BMI:     BP:   HR: bpm  TEMP: ( )  RESP:  Physical Exam Vitals and  nursing note reviewed.  Constitutional:      General: He is not in acute distress.    Appearance: Normal appearance. He is not ill-appearing.  HENT:     Head: Normocephalic and atraumatic.     Right Ear: External ear normal.     Left Ear: External ear normal.     Nose: No congestion.  Eyes:     Extraocular Movements: Extraocular movements intact.  Cardiovascular:     Rate and Rhythm: Normal rate.     Pulses: Normal pulses.  Pulmonary:     Effort: Pulmonary effort is normal. No respiratory distress.  Abdominal:     General: There is no distension.     Palpations: Abdomen is soft.  Musculoskeletal:        General: No tenderness or signs of injury.     Cervical back: Neck supple.     Right lower leg: No edema.     Left lower leg: No edema.     Comments: Patient has good distal strength without clonus.  Skin:    Findings: No erythema or rash.  Neurological:     General: No focal deficit present.     Mental Status: He is alert and oriented to person, place, and time.     Sensory: No sensory deficit.     Motor: No weakness or abnormal muscle tone.     Coordination: Coordination normal.  Psychiatric:        Mood and Affect: Mood normal.        Behavior: Behavior normal.      Imaging: XR C-ARM NO REPORT  Result Date: 06/17/2022 Please see Notes tab for imaging impression.

## 2022-06-17 NOTE — Progress Notes (Signed)
Numeric Pain Rating Scale and Functional Assessment Average Pain 2   In the last MONTH (on 0-10 scale) has pain interfered with the following?  1. General activity like being  able to carry out your everyday physical activities such as walking, climbing stairs, carrying groceries, or moving a chair?  Rating(4)   +Driver, -BT, -Dye Allergies.  116/78

## 2022-06-17 NOTE — Patient Instructions (Signed)

## 2022-06-17 NOTE — Procedures (Signed)
Lumbosacral Transforaminal Epidural Steroid Injection - Sub-Pedicular Approach with Fluoroscopic Guidance  Patient: Jesse [name]      Date of Birth: February 25, 1941 MRN: [id number] PCP: Wenda Low, MD      Visit Date: 06/17/2022   Universal Protocol:    Date/Time: 06/17/2022  Consent Given By: the patient  Position: PRONE  Additional Comments: Vital signs were monitored before and after the procedure. Patient was prepped and draped in the usual sterile fashion. The correct patient, procedure, and site was verified.   Injection Procedure Details:   Procedure diagnoses: Lumbar radiculopathy [M54.16]    Meds Administered:  Meds ordered this encounter  Medications   methylPREDNISolone acetate (DEPO-MEDROL) injection 40 mg    Laterality: Left  Location/Site: L4 and L5  Needle:5.0 in., 22 ga.  Short bevel or Quincke spinal needle  Needle Placement: Transforaminal  Findings:    -Comments: Excellent flow of contrast along the nerve, nerve root and into the epidural space.  Procedure Details: After squaring off the end-plates to get a true AP view, the C-arm was positioned so that an oblique view of the foramen as noted above was visualized. The target area is just inferior to the "nose of the scotty dog" or sub pedicular. The soft tissues overlying this structure were infiltrated with 2-3 ml. of 1% Lidocaine without Epinephrine.  The spinal needle was inserted toward the target using a "trajectory" view along the fluoroscope beam.  Under AP and lateral visualization, the needle was advanced so it did not puncture dura and was located close the 6 O'Clock position of the pedical in AP tracterory. Biplanar projections were used to confirm position. Aspiration was confirmed to be negative for CSF and/or blood. A 1-2 ml. volume of Isovue-250 was injected and flow of contrast was noted at each level. Radiographs were obtained for documentation purposes.   After attaining the desired  flow of contrast documented above, a 0.5 to 1.0 ml test dose of 0.25% Marcaine was injected into each respective transforaminal space.  The patient was observed for 90 seconds post injection.  After no sensory deficits were reported, and normal lower extremity motor function was noted,   the above injectate was administered so that equal amounts of the injectate were placed at each foramen (level) into the transforaminal epidural space.   Additional Comments:  The patient tolerated the procedure well Dressing: 2 x 2 sterile gauze and Band-Aid    Post-procedure details: Patient was observed during the procedure. Post-procedure instructions were reviewed.  Patient left the clinic in stable condition.

## 2022-07-26 ENCOUNTER — Ambulatory Visit (INDEPENDENT_AMBULATORY_CARE_PROVIDER_SITE_OTHER): Payer: Medicare Other

## 2022-07-26 ENCOUNTER — Ambulatory Visit: Payer: Medicare Other | Admitting: Orthopedic Surgery

## 2022-07-26 ENCOUNTER — Encounter: Payer: Self-pay | Admitting: Orthopedic Surgery

## 2022-07-26 VITALS — BP 129/76 | HR 78 | Ht 65.0 in | Wt 152.0 lb

## 2022-07-26 DIAGNOSIS — M5416 Radiculopathy, lumbar region: Secondary | ICD-10-CM | POA: Diagnosis not present

## 2022-07-26 MED ORDER — ACETAMINOPHEN 500 MG PO TABS: 1000.0000 mg | ORAL_TABLET | Freq: Three times a day (TID) | ORAL | 0 refills | Status: DC | PRN

## 2022-07-26 MED ORDER — NAPROXEN 500 MG PO TABS: 500.0000 mg | ORAL_TABLET | Freq: Two times a day (BID) | ORAL | 0 refills | Status: AC | PRN

## 2022-07-26 NOTE — Progress Notes (Signed)
Orthopedic Spine Surgery Office Note  Assessment: Patient is a 81 y.o. male with axial back pain in the setting of lumbar degenerative scoliosis.  He is not having any radicular symptoms.  His pain has been reasonably well controlled with meloxicam and periodic injection up to this point   Plan: -Explained that initially conservative treatment is tried as a significant number of patients may experience relief with these treatment modalities. Discussed that the conservative treatments include:  -activity modification  -physical therapy  -lumbar steroid injections -Since meloxicam is no longer working, I instructed him to stop taking it.  He is not using any Tylenol so I recommended and prescribed Tylenol 1000 mg 3 times daily.  I also prescribed him naproxen to be taken at most twice a day for flares of pain.  Since he has gotten some relief with injections, he can try another one in a couple months time -Patient should return to office in 8 weeks, repeat x-rays of lumbar spine at next visit: scoli films   Patient expressed understanding of the plan and all questions were answered to the patient's satisfaction.   ___________________________________________________________________________   History:  Patient is a 81 y.o. male who presents today for lumbar spine.  Patient has had several years of low back pain.  It does not radiate in his legs.  There is no injury or trauma that brought on the pain.  He feels that especially when getting out of bed in the morning and then near the end of the day when he has been active.  Rest does make it better.  Historically, a periodic injection and meloxicam has helped.  Within the last couple of weeks though the pain has gotten worse he is noticing in his lower back.  The meloxicam is not helping.  He is still not having any radicular pain.   Weakness: Denies Symptoms of imbalance: Denies Paresthesias and numbness: Denies Bowel or bladder incontinence:  Denies Saddle anesthesia: Denies  Treatments tried: Activity modification, NSAIDs, injection  Review of systems: Denies fevers and chills, night sweats, unexplained weight loss, history of cancer, pain that wakes them at night  Past medical history: Depression Hyperlipidemia Hypertension Polycythemia rubra vera Sleep apnea Osteoarthritis  Allergies: Hydrochlorothiazide, tramadol  Past surgical history:  Eye surgery  Social history: Denies use of nicotine product (smoking, vaping, patches, smokeless) Alcohol use: Denies Denies recreational drug use   Physical Exam:  General: no acute distress, appears stated age Neurologic: alert, answering questions appropriately, following commands Respiratory: unlabored breathing on room air, symmetric chest rise Psychiatric: appropriate affect, normal cadence to speech   MSK (spine):  -Strength exam      Left  Right EHL    4/5  4/5 TA    5/5  5/5 GSC    5/5  5/5 Knee extension  5/5  5/5 Hip flexion   5/5  5/5  -Sensory exam    Sensation intact to light touch in L3-S1 nerve distributions of bilateral lower extremities  -Straight leg raise: negative -Contralateral straight leg raise: negative -Clonus: no beats bilaterally  -Left hip exam: Pain at extremes of range of motion but this is not the pain is presenting for, negative St[place]field -Right hip exam: Pain at extremes of range of motion but this is not the pain is presenting for, negative [place]field  -Gait: slow but steady without assistive device  Imaging: XR of the lumbar spine from 07/26/2022 was independently reviewed and interpreted, showing lumbar degenerative scoliosis with apex to the left  at L2/3. Disc height loss at multiple levels with facet arthropathy. No evidence of instability on flexion/extension. No fracture or dislocation. PI of 58. LL of 30.    Patient name: Jesse [name] Patient MRN: [id number] Date of visit: 07/26/22

## 2022-08-13 ENCOUNTER — Inpatient Hospital Stay: Payer: Medicare Other

## 2022-08-13 ENCOUNTER — Encounter: Payer: Self-pay | Admitting: Family

## 2022-08-13 ENCOUNTER — Inpatient Hospital Stay: Payer: Medicare Other | Attending: Family | Admitting: Family

## 2022-08-13 VITALS — BP 137/72 | HR 77 | Temp 97.5°F | Resp 17 | Wt 154.0 lb

## 2022-08-13 DIAGNOSIS — D751 Secondary polycythemia: Secondary | ICD-10-CM

## 2022-08-13 DIAGNOSIS — D500 Iron deficiency anemia secondary to blood loss (chronic): Secondary | ICD-10-CM | POA: Diagnosis not present

## 2022-08-13 LAB — IRON AND IRON BINDING CAPACITY (CC-WL,HP ONLY)
Iron: 109 ug/dL (ref 45–182)
Saturation Ratios: 26 % (ref 17.9–39.5)
TIBC: 417 ug/dL (ref 250–450)
UIBC: 308 ug/dL (ref 117–376)

## 2022-08-13 LAB — CBC WITH DIFFERENTIAL (CANCER CENTER ONLY)
Abs Immature Granulocytes: 0.03 10*3/uL (ref 0.00–0.07)
Basophils Absolute: 0 10*3/uL (ref 0.0–0.1)
Basophils Relative: 1 %
Eosinophils Absolute: 0.1 10*3/uL (ref 0.0–0.5)
Eosinophils Relative: 2 %
HCT: 50.4 % (ref 39.0–52.0)
Hemoglobin: 16.8 g/dL (ref 13.0–17.0)
Immature Granulocytes: 0 %
Lymphocytes Relative: 27 %
Lymphs Abs: 1.8 10*3/uL (ref 0.7–4.0)
MCH: 31.9 pg (ref 26.0–34.0)
MCHC: 33.3 g/dL (ref 30.0–36.0)
MCV: 95.8 fL (ref 80.0–100.0)
Monocytes Absolute: 0.9 10*3/uL (ref 0.1–1.0)
Monocytes Relative: 14 %
Neutro Abs: 3.8 10*3/uL (ref 1.7–7.7)
Neutrophils Relative %: 56 %
Platelet Count: 243 10*3/uL (ref 150–400)
RBC: 5.26 MIL/uL (ref 4.22–5.81)
RDW: 13.7 % (ref 11.5–15.5)
WBC Count: 6.8 10*3/uL (ref 4.0–10.5)
nRBC: 0 % (ref 0.0–0.2)

## 2022-08-13 LAB — CMP (CANCER CENTER ONLY)
ALT: 20 U/L (ref 0–44)
AST: 16 U/L (ref 15–41)
Albumin: 4.3 g/dL (ref 3.5–5.0)
Alkaline Phosphatase: 51 U/L (ref 38–126)
Anion gap: 7 (ref 5–15)
BUN: 19 mg/dL (ref 8–23)
CO2: 28 mmol/L (ref 22–32)
Calcium: 10.8 mg/dL — ABNORMAL HIGH (ref 8.9–10.3)
Chloride: 97 mmol/L — ABNORMAL LOW (ref 98–111)
Creatinine: 0.8 mg/dL (ref 0.61–1.24)
GFR, Estimated: >60 mL/min (ref >60)
Glucose, Bld: 97 mg/dL (ref 70–99)
Potassium: 4.2 mmol/L (ref 3.5–5.1)
Sodium: 132 mmol/L — ABNORMAL LOW (ref 135–145)
Total Bilirubin: 1.5 mg/dL — ABNORMAL HIGH (ref 0.3–1.2)
Total Protein: 6.8 g/dL (ref 6.5–8.1)

## 2022-08-13 LAB — FERRITIN: Ferritin: 17 ng/mL — ABNORMAL LOW (ref 24–336)

## 2022-08-13 NOTE — Progress Notes (Signed)
Hematology and Oncology Follow Up Visit  Jesse [name] [id number] [birth year] 81 y.o. 08/13/2022   Principle Diagnosis:  Polycythemia - JAK2 negative   Current Therapy:        Phlebotomy to maintain hematocrit below 45% Aspirin 81 mg by mouth daily   Interim History:  Jesse [name] is here today for follow-up. He is doing well and has no complaints at this time.  He has occasional fatigue at times.  No abnormal bruising or petechiae.  No fever, chills, n/v, cough, rash, dizziness, SOB, chest pain, palpitations, abdominal pain or changes in bowel or bladder habits.  No numbness or tingling in his extremities at this time.  Intermittent swelling and tenderness in his knees, more so the left knee. He continues to follow-up with ortho and gets an injection in the left knee every 3 months or so.  No falls or syncope reported.  Appetite and hydration are good. Weight is stable at 154 lbs.   ECOG Performance Status: 0 - Asymptomatic  Medications:  Allergies as of 08/13/2022       Reactions   Hctz [hydrochlorothiazide] Other (See Comments)   Hyponatremia ; Na+ 111, w weakness resulting in mechanical fall   Tramadol Nausea And Vomiting        Medication List        Accurate as of August 13, 2022 10:49 AM. If you have any questions, ask your nurse or doctor.          acetaminophen 500 MG tablet Commonly known as: TYLENOL Take 2 tablets (1,000 mg total) by mouth every 8 (eight) hours as needed for mild pain or moderate pain.   ALPRAZolam 1 MG tablet Commonly known as: XANAX Take 0.5 mg by mouth at bedtime.   aspirin EC 81 MG tablet Take 81 mg by mouth daily.   bisacodyl 10 MG suppository Commonly known as: DULCOLAX Place 10 mg rectally as needed for moderate constipation.   fish oil-omega-3 fatty acids 1000 MG capsule Take 1 g by mouth daily.   Fluad Quadrivalent 0.5 ML injection Generic drug: influenza vaccine adjuvanted Inject into the muscle.   lisinopril 20  MG tablet Commonly known as: ZESTRIL Take 1 tablet (20 mg total) by mouth daily.   meloxicam 15 MG tablet Commonly known as: MOBIC Take 1 tablet (15 mg total) by mouth daily.   naproxen 500 MG tablet Commonly known as: NAPROSYN Take 1 tablet (500 mg total) by mouth 2 (two) times daily as needed.   pregabalin 50 MG capsule Commonly known as: LYRICA Take 1 capsule (50 mg total) by mouth at bedtime.   simvastatin 20 MG tablet Commonly known as: ZOCOR Take 1 tablet (20 mg total) by mouth daily at 6 PM.   tamsulosin 0.4 MG Caps capsule Commonly known as: FLOMAX Take 0.4 mg by mouth daily.   vitamin E 1000 UNIT capsule Take 1,000 Units by mouth daily.        Allergies:  Allergies  Allergen Reactions   Hctz [Hydrochlorothiazide] Other (See Comments)    Hyponatremia ; Na+ 111, w weakness resulting in mechanical fall   Tramadol Nausea And Vomiting    Past Medical History, Surgical history, Social history, and Family History were reviewed and updated.  Review of Systems: All other 10 point review of systems is negative.   Physical Exam:  weight is 154 lb (69.9 kg). His oral temperature is 97.5 F (36.4 C) (abnormal). His blood pressure is 137/72 and his pulse is 77. His respiration is  17 and oxygen saturation is 99%.   Wt Readings from Last 3 Encounters:  08/13/22 154 lb (69.9 kg)  07/26/22 152 lb (68.9 kg)  06/14/22 152 lb (68.9 kg)    Ocular: Sclerae unicteric, pupils equal, round and reactive to light Ear-nose-throat: Oropharynx clear, dentition fair Lymphatic: No cervical or supraclavicular adenopathy Lungs no rales or rhonchi, good excursion bilaterally Heart regular rate and rhythm, no murmur appreciated Abd soft, nontender, positive bowel sounds MSK no focal spinal tenderness, no joint edema Neuro: non-focal, well-oriented, appropriate affect Breasts: Deferred   Lab Results  Component Value Date   WBC 6.8 08/13/2022   HGB 16.8 08/13/2022   HCT 50.4  08/13/2022   MCV 95.8 08/13/2022   PLT 243 08/13/2022   Lab Results  Component Value Date   FERRITIN 18 (L) 04/11/2022   IRON 118 04/11/2022   TIBC 416 04/11/2022   UIBC 298 04/11/2022   IRONPCTSAT 28 04/11/2022   Lab Results  Component Value Date   RETICCTPCT 1.6 12/11/2021   RBC 5.26 08/13/2022   RETICCTABS 87.5 01/02/2011   No results found for: "KPAFRELGTCHN", "LAMBDASER", "KAPLAMBRATIO" No results found for: "IGGSERUM", "IGA", "IGMSERUM" No results found for: "TOTALPROTELP", "ALBUMINELP", "A1GS", "A2GS", "BETS", "BETA2SER", "GAMS", "MSPIKE", "SPEI"   Chemistry      Component Value Date/Time   NA 132 (L) 08/13/2022 1001   NA 136 08/19/2017 1149   NA 135 (L) 10/21/2016 1151   K 4.2 08/13/2022 1001   K 3.8 08/19/2017 1149   K 3.9 10/21/2016 1151   CL 97 (L) 08/13/2022 1001   CL 95 (L) 08/19/2017 1149   CO2 28 08/13/2022 1001   CO2 28 08/19/2017 1149   CO2 25 10/21/2016 1151   BUN 19 08/13/2022 1001   BUN 14 08/19/2017 1149   BUN 13.6 10/21/2016 1151   CREATININE 0.80 08/13/2022 1001   CREATININE 1.0 08/19/2017 1149   CREATININE 0.8 10/21/2016 1151      Component Value Date/Time   CALCIUM 10.8 (H) 08/13/2022 1001   CALCIUM 10.1 08/19/2017 1149   CALCIUM 10.3 10/21/2016 1151   ALKPHOS 51 08/13/2022 1001   ALKPHOS 68 08/19/2017 1149   ALKPHOS 68 10/21/2016 1151   AST 16 08/13/2022 1001   AST 25 10/21/2016 1151   ALT 20 08/13/2022 1001   ALT 32 08/19/2017 1149   ALT 38 10/21/2016 1151   BILITOT 1.5 (H) 08/13/2022 1001   BILITOT 1.22 (H) 10/21/2016 1151       Impression and Plan: Jesse [name] is a very pleasant 81 yo caucasian gentleman with polycythemia, JAK-2 negative.  We will proceed with phlebotomy today for Hct 50.4%.  Lab check in 8 weeks, follow-up in 4 months.   Lottie Dawson, NP 11/21/202310:49 AM

## 2022-08-13 NOTE — Patient Instructions (Signed)

## 2022-08-13 NOTE — Progress Notes (Signed)
Jesse [name] presents today for phlebotomy per MD orders. Phlebotomy procedure started at 1039 and ended at 1044. 532  grams removed from lt [facility] using 16g phlebotomy kit.  Patient observed for 30 minutes after procedure without any incident. Patient tolerated procedure well. IV needle removed intact.

## 2022-09-25 ENCOUNTER — Ambulatory Visit: Payer: Medicare Other | Admitting: Orthopedic Surgery

## 2022-09-25 ENCOUNTER — Ambulatory Visit (INDEPENDENT_AMBULATORY_CARE_PROVIDER_SITE_OTHER): Payer: Medicare Other

## 2022-09-25 DIAGNOSIS — M5416 Radiculopathy, lumbar region: Secondary | ICD-10-CM

## 2022-09-25 MED ORDER — NAPROXEN 500 MG PO TABS: 500.0000 mg | ORAL_TABLET | Freq: Two times a day (BID) | ORAL | 1 refills | Status: DC

## 2022-09-25 MED ORDER — CELECOXIB 200 MG PO CAPS: 200.0000 mg | ORAL_CAPSULE | Freq: Two times a day (BID) | ORAL | 0 refills | Status: DC

## 2022-09-25 MED ORDER — ACETAMINOPHEN 500 MG PO TABS: 1000.0000 mg | ORAL_TABLET | Freq: Three times a day (TID) | ORAL | 0 refills | Status: DC | PRN

## 2022-09-25 NOTE — Progress Notes (Signed)
Orthopedic Spine Surgery Office Note  Assessment: Patient is a 82 y.o. male with chronic low back pain and sagittal imbalance (T1 pelvis angle 24, SVA of 7) and radicular left leg pain in L4 or L5 distribution   Plan: -Explained that initially conservative treatment is tried as a significant number of patients may experience relief with these treatment modalities. Discussed that the conservative treatments include:  -activity modification  -physical therapy  -over the counter pain medications  -medrol dosepak  -lumbar steroid injections -Patient has tried activity modification, NSAIDs, injection (left L4 and L5) -Explained that he has sagittal imbalance that may be causing some of his back pain, but if he is getting tolerable pain relief with conservative treatments, we should continue that. He states he does get some relief with naproxen (524mg BID, he has been cutting in half and I told him to keep doing that) so prescribed that today. He should discontinue all other NSAIDs, explained that to him today. Recommended he do naproxen BID, tylenol TID, and injections as needed (would do L4/5 and L5/S1 transforaminal on th eleft) -Patient should return to office in 8 weeks, x-rays at next visit: none   Patient expressed understanding of the plan and all questions were answered to the patient's satisfaction.   ___________________________________________________________________________  History: Patient is a 82y.o. male who has been previously seen in the office for symptoms consistent with lumbar radiculopathy. Since the last visit, symptom intensity has remained that same, but now is reporting equal amount of back and left leg pain. No pain on the right leg. Pain in the back is still the lower back. Pain in the let leg is on the posterior and lateral aspect into the dorsal and medial aspect of the foot. No pain proximal to the knee on the left side. Sometimes gets paresthesias in the leg in the  same distribution as the pain, but this is not regular. Denies numbness. No other paresthesias.   Previous treatments: activity modification, NSAIDs, injection (left L4 and L5)  COPY OF FIRST NOTE Patient is a 82y.o. male who presents today for lumbar spine.  Patient has had several years of low back pain.  It does not radiate in his legs.  There is no injury or trauma that brought on the pain.  He feels that especially when getting out of bed in the morning and then near the end of the day when he has been active.  Rest does make it better.  Historically, a periodic injection and meloxicam has helped.  Within the last couple of weeks though the pain has gotten worse he is noticing in his lower back.  The meloxicam is not helping.  He is still not having any radicular pain.     Weakness: Denies Symptoms of imbalance: Denies Paresthesias and numbness: Denies Bowel or bladder incontinence: Denies Saddle anesthesia: Denies END OF COPY  Physical Exam:  General: no acute distress, appears stated age Neurologic: alert, answering questions appropriately, following commands Respiratory: unlabored breathing on room air, symmetric chest rise Psychiatric: appropriate affect, normal cadence to speech   MSK (spine):  -Strength exam      Left  Right EHL    4/5  4/5 TA    5/5  5/5 GSC    5/5  5/5 Knee extension  5/5  5/5 Hip flexion   5/5  5/5  -Sensory exam    Sensation intact to light touch in L3-S1 nerve distributions of bilateral lower extremities  -Straight leg  raise: negative -Contralateral straight leg raise: negative -Clonus: no beats bilaterally  -Left hip exam: no pain through range of motion, negative stinchfield -Right hip exam: no pain through range of motion, negative stinchfield  Imaging: XR of the lumbar spine from 07/26/2022 was previously independently reviewed and interpreted, showing lumbar degenerative scoliosis with apex to the left at L2/3. Disc height loss at  multiple levels with facet arthropathy. No evidence of instability on flexion/extension. No fracture or dislocation. PI of 58. LL of 30.    XR scoliosis from 09/25/2022 was independently reviewed and interpreted, showing LL of 34, PI of 56. SVA of 7cm. T1 pelvis angle of 24. 1.2 cm of coronal imbalance from the CSVL.   Patient name: Jesse [name] Patient MRN: [id number] Date of visit: 09/25/22

## 2022-10-08 ENCOUNTER — Inpatient Hospital Stay: Payer: Medicare Other | Attending: Family

## 2022-10-08 ENCOUNTER — Inpatient Hospital Stay: Payer: Medicare Other

## 2022-10-08 VITALS — BP 113/75 | HR 92 | Resp 18

## 2022-10-08 DIAGNOSIS — D500 Iron deficiency anemia secondary to blood loss (chronic): Secondary | ICD-10-CM

## 2022-10-08 DIAGNOSIS — D45 Polycythemia vera: Secondary | ICD-10-CM

## 2022-10-08 DIAGNOSIS — R5383 Other fatigue: Secondary | ICD-10-CM | POA: Diagnosis not present

## 2022-10-08 DIAGNOSIS — D751 Secondary polycythemia: Secondary | ICD-10-CM | POA: Diagnosis not present

## 2022-10-08 LAB — IRON AND IRON BINDING CAPACITY (CC-WL,HP ONLY)
Iron: 90 ug/dL (ref 45–182)
Saturation Ratios: 21 % (ref 17.9–39.5)
TIBC: 421 ug/dL (ref 250–450)
UIBC: 331 ug/dL (ref 117–376)

## 2022-10-08 LAB — CMP (CANCER CENTER ONLY)
ALT: 20 U/L (ref 0–44)
AST: 16 U/L (ref 15–41)
Albumin: 4.2 g/dL (ref 3.5–5.0)
Alkaline Phosphatase: 50 U/L (ref 38–126)
Anion gap: 9 (ref 5–15)
BUN: 19 mg/dL (ref 8–23)
CO2: 26 mmol/L (ref 22–32)
Calcium: 10.3 mg/dL (ref 8.9–10.3)
Chloride: 98 mmol/L (ref 98–111)
Creatinine: 0.74 mg/dL (ref 0.61–1.24)
GFR, Estimated: >60 mL/min (ref >60)
Glucose, Bld: 105 mg/dL — ABNORMAL HIGH (ref 70–99)
Potassium: 3.8 mmol/L (ref 3.5–5.1)
Sodium: 133 mmol/L — ABNORMAL LOW (ref 135–145)
Total Bilirubin: 1.3 mg/dL — ABNORMAL HIGH (ref 0.3–1.2)
Total Protein: 6.8 g/dL (ref 6.5–8.1)

## 2022-10-08 LAB — CBC WITH DIFFERENTIAL (CANCER CENTER ONLY)
Abs Immature Granulocytes: 0.09 10*3/uL — ABNORMAL HIGH (ref 0.00–0.07)
Basophils Absolute: 0 10*3/uL (ref 0.0–0.1)
Basophils Relative: 1 %
Eosinophils Absolute: 0.1 10*3/uL (ref 0.0–0.5)
Eosinophils Relative: 2 %
HCT: 48.5 % (ref 39.0–52.0)
Hemoglobin: 16.2 g/dL (ref 13.0–17.0)
Immature Granulocytes: 1 %
Lymphocytes Relative: 31 %
Lymphs Abs: 2.2 10*3/uL (ref 0.7–4.0)
MCH: 31.3 pg (ref 26.0–34.0)
MCHC: 33.4 g/dL (ref 30.0–36.0)
MCV: 93.6 fL (ref 80.0–100.0)
Monocytes Absolute: 1.1 10*3/uL — ABNORMAL HIGH (ref 0.1–1.0)
Monocytes Relative: 15 %
Neutro Abs: 3.6 10*3/uL (ref 1.7–7.7)
Neutrophils Relative %: 50 %
Platelet Count: 253 10*3/uL (ref 150–400)
RBC: 5.18 MIL/uL (ref 4.22–5.81)
RDW: 12.8 % (ref 11.5–15.5)
WBC Count: 7.1 10*3/uL (ref 4.0–10.5)
nRBC: 0 % (ref 0.0–0.2)

## 2022-10-08 LAB — FERRITIN: Ferritin: 13 ng/mL — ABNORMAL LOW (ref 24–336)

## 2022-10-08 NOTE — Progress Notes (Signed)
Jesse [name] presents today for phlebotomy per MD orders. Phlebotomy procedure started at 1015 and ended at 1025. 475 cc removed. Patient tolerated procedure well. IV needle removed intact.

## 2022-10-08 NOTE — Patient Instructions (Signed)

## 2022-11-20 ENCOUNTER — Other Ambulatory Visit: Payer: Self-pay | Admitting: Orthopedic Surgery

## 2022-11-25 ENCOUNTER — Ambulatory Visit: Payer: Medicare Other | Admitting: Orthopedic Surgery

## 2022-11-25 DIAGNOSIS — M5416 Radiculopathy, lumbar region: Secondary | ICD-10-CM

## 2022-11-25 NOTE — Progress Notes (Signed)
Orthopedic Spine Surgery Office Note  Assessment: Patient is a 82 y.o. male with chronic low back pain and sagittal imbalance (T1 pelvis angle 24, SVA of 7) and radicular left leg pain in L4 or L5 distribution    Plan: -Patient has tried activity modification, NSAIDs, tylenol, injections -Has been getting good relief with naproxen and injections. Explained that surgery is an option should conservative treatments fail. He was interested in repeat injection, referral provided to him today for left L4/5 and L5/S1 transforaminal injections -Patient should return to office in 8 weeks, x-rays at next visit: none   Patient expressed understanding of the plan and all questions were answered to the patient's satisfaction.   ___________________________________________________________________________  History: Patient is a 82 y.o. male who has been previously seen in the office for symptoms consistent with lumbar radiculopathy. Patient has chronic low back pain. States that that pain is tolerable at the moment with activity modification and naproxen. He has pain radiating into his left medial leg and dorsal foot. No radiating pain on the right side. Has gotten good relief of his leg pain with injections and feel they last for awhile for him. Denies paresthesias and numbness.   Previous treatments: activity modification, NSAIDs, tylenol, injections  Physical Exam:  General: no acute distress, appears stated age Neurologic: alert, answering questions appropriately, following commands Respiratory: unlabored breathing on room air, symmetric chest rise Psychiatric: appropriate affect, normal cadence to speech   MSK (spine):  -Strength exam      Left  Right EHL    4/5  4/5 TA    5/5  5/5 GSC    5/5  5/5 Knee extension  5/5  5/5 Hip flexion   5/5  5/5  -Sensory exam    Sensation intact to light touch in L3-S1 nerve distributions of bilateral lower extremities  -Straight leg raise:  negative -Contralateral straight leg raise: negative -Femoral nerve stretch test: negative bilaterally -Clonus: no beats bilaterally  Imaging: XR of the lumbar spine from 07/26/2022 was previously independently reviewed and interpreted, showing lumbar degenerative scoliosis with apex to the left at L2/3. Disc height loss at multiple levels with facet arthropathy. No evidence of instability on flexion/extension. No fracture or dislocation. PI of 58. LL of 30.     XR scoliosis from 09/25/2022 was previously independently reviewed and interpreted, showing LL of 34, PI of 56. SVA of 7cm. T1 pelvis angle of 24. 1.2 cm of coronal imbalance from the CSVL.   Patient name: Jesse [name] Patient MRN: [id number] Date of visit: 11/25/22

## 2022-11-29 DIAGNOSIS — E7800 Pure hypercholesterolemia, unspecified: Secondary | ICD-10-CM | POA: Diagnosis not present

## 2022-11-29 DIAGNOSIS — E538 Deficiency of other specified B group vitamins: Secondary | ICD-10-CM | POA: Diagnosis not present

## 2022-11-29 DIAGNOSIS — E871 Hypo-osmolality and hyponatremia: Secondary | ICD-10-CM | POA: Diagnosis not present

## 2022-11-29 DIAGNOSIS — D45 Polycythemia vera: Secondary | ICD-10-CM | POA: Diagnosis not present

## 2022-11-29 DIAGNOSIS — M519 Unspecified thoracic, thoracolumbar and lumbosacral intervertebral disc disorder: Secondary | ICD-10-CM | POA: Diagnosis not present

## 2022-11-29 DIAGNOSIS — I10 Essential (primary) hypertension: Secondary | ICD-10-CM | POA: Diagnosis not present

## 2022-11-29 DIAGNOSIS — I700 Atherosclerosis of aorta: Secondary | ICD-10-CM | POA: Diagnosis not present

## 2022-12-03 ENCOUNTER — Inpatient Hospital Stay: Payer: Medicare Other

## 2022-12-03 ENCOUNTER — Inpatient Hospital Stay: Payer: Medicare Other | Attending: Family | Admitting: Family

## 2022-12-03 VITALS — BP 110/66 | HR 90 | Resp 18

## 2022-12-03 DIAGNOSIS — D45 Polycythemia vera: Secondary | ICD-10-CM

## 2022-12-03 DIAGNOSIS — D751 Secondary polycythemia: Secondary | ICD-10-CM

## 2022-12-03 DIAGNOSIS — D500 Iron deficiency anemia secondary to blood loss (chronic): Secondary | ICD-10-CM

## 2022-12-03 LAB — CBC WITH DIFFERENTIAL (CANCER CENTER ONLY)
Abs Immature Granulocytes: 0.02 10*3/uL (ref 0.00–0.07)
Basophils Absolute: 0 10*3/uL (ref 0.0–0.1)
Basophils Relative: 0 %
Eosinophils Absolute: 0.1 10*3/uL (ref 0.0–0.5)
Eosinophils Relative: 2 %
HCT: 46.6 % (ref 39.0–52.0)
Hemoglobin: 15.3 g/dL (ref 13.0–17.0)
Immature Granulocytes: 0 %
Lymphocytes Relative: 27 %
Lymphs Abs: 1.9 10*3/uL (ref 0.7–4.0)
MCH: 30.2 pg (ref 26.0–34.0)
MCHC: 32.8 g/dL (ref 30.0–36.0)
MCV: 91.9 fL (ref 80.0–100.0)
Monocytes Absolute: 1.1 10*3/uL — ABNORMAL HIGH (ref 0.1–1.0)
Monocytes Relative: 15 %
Neutro Abs: 3.9 10*3/uL (ref 1.7–7.7)
Neutrophils Relative %: 56 %
Platelet Count: 254 10*3/uL (ref 150–400)
RBC: 5.07 MIL/uL (ref 4.22–5.81)
RDW: 13.5 % (ref 11.5–15.5)
WBC Count: 7 10*3/uL (ref 4.0–10.5)
nRBC: 0 % (ref 0.0–0.2)

## 2022-12-03 LAB — CMP (CANCER CENTER ONLY)
ALT: 18 U/L (ref 0–44)
AST: 15 U/L (ref 15–41)
Albumin: 4.1 g/dL (ref 3.5–5.0)
Alkaline Phosphatase: 56 U/L (ref 38–126)
Anion gap: 7 (ref 5–15)
BUN: 17 mg/dL (ref 8–23)
CO2: 27 mmol/L (ref 22–32)
Calcium: 10.1 mg/dL (ref 8.9–10.3)
Chloride: 97 mmol/L — ABNORMAL LOW (ref 98–111)
Creatinine: 0.77 mg/dL (ref 0.61–1.24)
GFR, Estimated: >60 mL/min (ref >60)
Glucose, Bld: 101 mg/dL — ABNORMAL HIGH (ref 70–99)
Potassium: 4.3 mmol/L (ref 3.5–5.1)
Sodium: 131 mmol/L — ABNORMAL LOW (ref 135–145)
Total Bilirubin: 1.2 mg/dL (ref 0.3–1.2)
Total Protein: 6.7 g/dL (ref 6.5–8.1)

## 2022-12-03 LAB — IRON AND IRON BINDING CAPACITY (CC-WL,HP ONLY)
Iron: 93 ug/dL (ref 45–182)
Saturation Ratios: 22 % (ref 17.9–39.5)
TIBC: 417 ug/dL (ref 250–450)
UIBC: 324 ug/dL (ref 117–376)

## 2022-12-03 LAB — FERRITIN: Ferritin: 16 ng/mL — ABNORMAL LOW (ref 24–336)

## 2022-12-03 NOTE — Progress Notes (Signed)
Hematology and Oncology Follow Up Visit  Jesse [name] [id number] Dec 04, 1940 82 y.o. 12/03/2022   Principle Diagnosis:  Polycythemia - JAK2 negative   Current Therapy:        Phlebotomy to maintain hematocrit below 45% Aspirin 81 mg by mouth daily   Interim History:  Jesse [name] is here today for follow-up and phlebotomy. Hct today is 46.6%. He is doing well and has not had much fatigue.  He has had some joint aches and pains with arthritis.  No fever, chills, n/v, cough, rash, dizziness, SOB, chest pain, palpitations, abdominal pain or changes in bowel or bladder habits.  No swelling in his extremities.  No falls or syncope reported.  Appetite and hydration are good. Weight is stable at 156 lbs.   ECOG Performance Status: 1 - Symptomatic but completely ambulatory  Medications:  Allergies as of 12/03/2022       Reactions   Hctz [hydrochlorothiazide] Other (See Comments)   Hyponatremia ; Na+ 111, w weakness resulting in mechanical fall   Tramadol Nausea And Vomiting        Medication List        Accurate as of December 03, 2022  9:59 AM. If you have any questions, ask your nurse or doctor.          acetaminophen 500 MG tablet Commonly known as: TYLENOL Take 2 tablets (1,000 mg total) by mouth every 8 (eight) hours as needed for mild pain or moderate pain.   ALPRAZolam 1 MG tablet Commonly known as: XANAX Take 0.5 mg by mouth at bedtime.   aspirin EC 81 MG tablet Take 81 mg by mouth daily.   bisacodyl 10 MG suppository Commonly known as: DULCOLAX Place 10 mg rectally as needed for moderate constipation.   fish oil-omega-3 fatty acids 1000 MG capsule Take 1 g by mouth daily.   Fluad Quadrivalent 0.5 ML injection Generic drug: influenza vaccine adjuvanted Inject into the muscle.   lisinopril 20 MG tablet Commonly known as: ZESTRIL Take 1 tablet (20 mg total) by mouth daily.   naproxen 500 MG tablet Commonly known as: NAPROSYN TAKE 1 TABLET BY MOUTH 2  TIMES DAILY WITH A MEAL.   pregabalin 50 MG capsule Commonly known as: LYRICA Take 1 capsule (50 mg total) by mouth at bedtime.   simvastatin 20 MG tablet Commonly known as: ZOCOR Take 1 tablet (20 mg total) by mouth daily at 6 PM.   tamsulosin 0.4 MG Caps capsule Commonly known as: FLOMAX Take 0.4 mg by mouth daily.   vitamin E 1000 UNIT capsule Take 1,000 Units by mouth daily.        Allergies:  Allergies  Allergen Reactions   Hctz [Hydrochlorothiazide] Other (See Comments)    Hyponatremia ; Na+ 111, w weakness resulting in mechanical fall   Tramadol Nausea And Vomiting    Past Medical History, Surgical history, Social history, and Family History were reviewed and updated.  Review of Systems: All other 10 point review of systems is negative.   Physical Exam:  vitals were not taken for this visit.   Wt Readings from Last 3 Encounters:  08/13/22 154 lb (69.9 kg)  07/26/22 152 lb (68.9 kg)  06/14/22 152 lb (68.9 kg)    Ocular: Sclerae unicteric, pupils equal, round and reactive to light Ear-nose-throat: Oropharynx clear, dentition fair Lymphatic: No cervical or supraclavicular adenopathy Lungs no rales or rhonchi, good excursion bilaterally Heart regular rate and rhythm, no murmur appreciated Abd soft, nontender, positive bowel sounds MSK  no focal spinal tenderness, no joint edema Neuro: non-focal, well-oriented, appropriate affect Breasts: Deferred   Lab Results  Component Value Date   WBC 7.0 12/03/2022   HGB 15.3 12/03/2022   HCT 46.6 12/03/2022   MCV 91.9 12/03/2022   PLT 254 12/03/2022   Lab Results  Component Value Date   FERRITIN 13 (L) 10/08/2022   IRON 90 10/08/2022   TIBC 421 10/08/2022   UIBC 331 10/08/2022   IRONPCTSAT 21 10/08/2022   Lab Results  Component Value Date   RETICCTPCT 1.6 12/11/2021   RBC 5.07 12/03/2022   RETICCTABS 87.5 01/02/2011   No results found for: "KPAFRELGTCHN", "LAMBDASER", "KAPLAMBRATIO" No results found  for: "IGGSERUM", "IGA", "IGMSERUM" No results found for: "TOTALPROTELP", "ALBUMINELP", "A1GS", "A2GS", "BETS", "BETA2SER", "GAMS", "MSPIKE", "SPEI"   Chemistry      Component Value Date/Time   NA 133 (L) 10/08/2022 0948   NA 136 08/19/2017 1149   NA 135 (L) 10/21/2016 1151   K 3.8 10/08/2022 0948   K 3.8 08/19/2017 1149   K 3.9 10/21/2016 1151   CL 98 10/08/2022 0948   CL 95 (L) 08/19/2017 1149   CO2 26 10/08/2022 0948   CO2 28 08/19/2017 1149   CO2 25 10/21/2016 1151   BUN 19 10/08/2022 0948   BUN 14 08/19/2017 1149   BUN 13.6 10/21/2016 1151   CREATININE 0.74 10/08/2022 0948   CREATININE 1.0 08/19/2017 1149   CREATININE 0.8 10/21/2016 1151      Component Value Date/Time   CALCIUM 10.3 10/08/2022 0948   CALCIUM 10.1 08/19/2017 1149   CALCIUM 10.3 10/21/2016 1151   ALKPHOS 50 10/08/2022 0948   ALKPHOS 68 08/19/2017 1149   ALKPHOS 68 10/21/2016 1151   AST 16 10/08/2022 0948   AST 25 10/21/2016 1151   ALT 20 10/08/2022 0948   ALT 32 08/19/2017 1149   ALT 38 10/21/2016 1151   BILITOT 1.3 (H) 10/08/2022 0948   BILITOT 1.22 (H) 10/21/2016 1151       Impression and Plan: Jesse [name] is a very pleasant 82 yo caucasian gentleman with polycythemia, JAK-2 negative.  We will proceed with phlebotomy today for Hct 46.6%.  Lab check in 8 weeks, follow-up in 4 months.   Lottie Dawson, NP 3/12/20249:59 AM

## 2022-12-03 NOTE — Patient Instructions (Signed)

## 2022-12-03 NOTE — Progress Notes (Signed)
Angie Fava presents today for phlebotomy per MD orders. Phlebotomy procedure started at 1025 with 20 g angio per patient request and ended at 1045. 522 grams removed.  Refreshments given.  Patient observed for 30 minutes after procedure without any incident. Patient tolerated procedure well. IV needle removed intact.

## 2022-12-10 ENCOUNTER — Encounter: Payer: Self-pay | Admitting: Physical Medicine & Rehabilitation

## 2022-12-12 ENCOUNTER — Encounter: Payer: Self-pay | Admitting: Physical Medicine & Rehabilitation

## 2022-12-12 ENCOUNTER — Encounter: Payer: Medicare Other | Attending: Physical Medicine & Rehabilitation | Admitting: Physical Medicine & Rehabilitation

## 2022-12-12 VITALS — BP 122/74 | HR 88 | Ht 65.0 in | Wt 156.0 lb

## 2022-12-12 DIAGNOSIS — M48062 Spinal stenosis, lumbar region with neurogenic claudication: Secondary | ICD-10-CM | POA: Insufficient documentation

## 2022-12-12 MED ORDER — PREGABALIN 100 MG PO CAPS: 100.0000 mg | ORAL_CAPSULE | Freq: Every day | ORAL | 5 refills | Status: DC

## 2022-12-12 NOTE — Progress Notes (Signed)
Subjective:    Patient ID: Jesse [name], male    DOB: 10-30-1940, 82 y.o.   MRN: [id number]  HPI CC:   Left Foot, leg and back pain  Onset of pain ~2021, Initially seen by Podiatry , Dr Scherrie November who referred pt to Dr Leta Baptist, who performed imaging studies and referred pt to Dr Louanne Skye.  Seen by Dr Louanne Skye, no surgery performed  No progressive lower ext weakness or bowel or bladder dysfunction.  Independent with all self care and mobilty , still drives and helps care for his older sister 1 day per week.    PDQ -9 score is 8- mild depressive symptoms  He has been trialed on pregabalin 50mg  BID which made him tired during the day so he only took it at night.  He did not find it to be effective  07/16/20 MRI done at [facility] 07/14/2020 PATIENT NAME: Jesse [name] DOB: 22-Feb-1941 MRN: [id number]   EXAM: MRI of the lumbar spine without contrast   ORDERING CLINICIAN: Andrey Spearman MD CLINICAL HISTORY: 82 year old man with left lumbar radiculopathy COMPARISON FILMS: None   TECHNIQUE: MRI of the lumbar spine was obtained utilizing 4 mm sagittal slices from 0000000 down to the lower sacrum with T1, T2 and inversion recovery views. In addition 4 mm axial slices from Q000111Q down to L5-S1 level were included with T1 and T2 weighted views. CONTRAST: None IMAGING SITE: [facility], [address] [place], [place], [place]     FINDINGS: On sagittal images, the spine is imaged from T11 to the sacrum.   The conus medullaris and cauda equine appear normal.   There is 1 to 2 mm of retrolisthesis of T12 upon L1, 1 mm retrolisthesis of L1 upon L2, 2 mm retrolisthesis of L2 upon 3 and 1 mm retrolisthesis of L4 upon L5.  There is mild scoliosis, convex to the left.  There is reduced disc height at all of the lumbar levels, most pronounced at L2-L3.  Endplate degenerative changes are noted at L1-L2, L2-L3 and L3-L4.  A Schmorl's node enters the inferior endplate of L1.   The discs and interspaces were further  evaluated on axial views from L1 to S1 as follows:   T12-L1: There is minimal retrolisthesis, minimal disc protrusion to the left and endplate spurring.  There is mild left foraminal stenosis but no nerve root compression or spinal stenosis.   L1-L2: There is minimal retrolisthesis, disc bulging.  There is mild lateral recess stenosis but no nerve root compression or spinal stenosis.   L2-L3: There is 2 mm retrolisthesis associated with disc protrusion and endplate spurring.  There is mild foraminal narrowing, mild left lateral recess stenosis and moderate right lateral recess stenosis.  There is no nerve root compression or spinal stenosis.   L3-L4: There is disc bulging, endplate spurring, mild facet hypertrophy combining to cause moderate bilateral foraminal narrowing and moderate bilateral lateral recess stenosis.  There is no definite nerve root compression though the degenerative changes encroach upon the exiting and traversing nerve roots.   L4-L5: There is minimal retrolisthesis, disc bulging, endplate spurring and moderate ligamenta flava hypertrophy causing moderately severe left foraminal narrowing and severe left lateral recess stenosis.  There is also mild right foraminal narrowing and lateral recess stenosis.  There is potential for left L4 and left L5 nerve root compression.   L5-S1: There is a right paramedian disc protrusion and endplate spurring causing moderate right foraminal narrowing, moderately severe right lateral recess stenosis moderate left foraminal narrowing  and moderate left lateral recess stenosis.  There is potential for right S1 nerve root compression.      Pain Inventory Average Pain 4 Pain Right Now 3 My pain is intermittent, burning, tingling, and aching  In the last 24 hours, has pain interfered with the following? General activity 4 Relation with others 1 Enjoyment of life 4 What TIME of day is your pain at its worst? morning  Sleep (in general)  Fair  Pain is worse with: walking, bending, and standing Pain improves with: rest, heat/ice, and injections Relief from Meds: 5  walk without assistance how many minutes can you walk? 5 ability to climb steps?  yes do you drive?  yes Do you have any goals in this area?  yes  retired  No problems in this area  New patient  Primary care      Family History  Problem Relation Age of Onset   Heart attack Mother    Social History   Socioeconomic History   Marital status: Single    Spouse name: Not on file   Number of children: Not on file   Years of education: Not on file   Highest education level: Not on file  Occupational History    Comment: retired  Tobacco Use   Smoking status: Never   Smokeless tobacco: Never   Tobacco comments:    never used tobacco  Vaping Use   Vaping Use: Never used  Substance and Sexual Activity   Alcohol use: No    Alcohol/week: 0.0 standard drinks of alcohol   Drug use: No   Sexual activity: Not Currently  Other Topics Concern   Not on file  Social History Narrative   Not on file   Social Determinants of Health   Financial Resource Strain: Not on file  Food Insecurity: Not on file  Transportation Needs: Not on file  Physical Activity: Not on file  Stress: Not on file  Social Connections: Not on file   Past Surgical History:  Procedure Laterality Date   GAS INSERTION  06/04/2012   Procedure: INSERTION OF GAS;  Surgeon: Hayden Pedro, MD;  Location: [place] [facility];  Service: Ophthalmology;  Laterality: Left;  C3F8   SCLERAL BUCKLE  06/04/2012   Procedure: SCLERAL BUCKLE;  Surgeon: Hayden Pedro, MD;  Location: [place];  Service: Ophthalmology;  Laterality: Left;  Headscope laser   Past Medical History:  Diagnosis Date   Depression    Hypercholesteremia    Hypertension    HZ (herpes zoster) 09/02/2011   Neuritis    left foot   Polycythemia rubra vera ([place]) 09/02/2011   BP 122/74   Pulse 88   Ht 5\' 5"  (1.651 m)   Wt 156 lb  (70.8 kg)   SpO2 97%   BMI 25.96 kg/m   Opioid Risk Score:   Fall Risk Score:  `1  Depression screen [facility] 2/9     12/12/2022   10:24 AM 12/28/2013    9:00 AM  Depression screen PHQ 2/9  Decreased Interest 2 0  Down, Depressed, Hopeless 1 0  PHQ - 2 Score 3 0  Altered sleeping 1   Tired, decreased energy 2   Change in appetite 0   Feeling bad or failure about yourself  0   Trouble concentrating 0   Moving slowly or fidgety/restless 0   Suicidal thoughts 0   PHQ-9 Score 6      Review of Systems  Musculoskeletal:  Positive for back pain.  LT leg and foot pain       Objective:   Physical Exam Vitals and nursing note reviewed.  Constitutional:      Appearance: He is normal weight.  HENT:     Head: Normocephalic and atraumatic.  Eyes:     Extraocular Movements: Extraocular movements intact.     Conjunctiva/sclera: Conjunctivae normal.     Pupils: Pupils are equal, round, and reactive to light.  Musculoskeletal:     Right lower leg: No edema.     Left lower leg: No edema.  Skin:    General: Skin is warm and dry.  Neurological:     Mental Status: He is alert and oriented to person, place, and time.     Deep Tendon Reflexes:     Reflex Scores:      Tricep reflexes are 3+ on the right side and 3+ on the left side.      Bicep reflexes are 3+ on the right side and 3+ on the left side.      Brachioradialis reflexes are 3+ on the right side and 3+ on the left side.      Patellar reflexes are 3+ on the right side and 3+ on the left side.      Achilles reflexes are 3+ on the right side and 3+ on the left side.    Comments: Neuro:  Eyes without evidence of nystagmus  Tone is normal without evidence of spasticity Cerebellar exam shows no evidence of ataxia on finger nose finger or heel to shin testing No evidence of trunkal ataxia  Motor strength is 5/5 in bilateral deltoid, biceps, triceps, finger flexors and extensors, wrist flexors and extensors, hip flexors, knee  flexors and extensors, ankle dorsiflexors, plantar flexors, invertors and evertors,  5/5 Right EHL, 4/5 Left EHL  Sensory exam is normal to pinprick, proprioception and light touch in the upper and lower limbs       Psychiatric:        Mood and Affect: Mood normal.        Behavior: Behavior normal.           Assessment & Plan:    Lumbar spinal stenosis with chronic low back and LLE pain.  Has EHL weakness on Left suggesting Left L5 nerve root as primary cause of sciatica,  having decrease spine mobility , send to aquatic PT. Increase pregabalin to 100mg  qhs.  Will re eval in 61month , consider  Left L5-S1 transforaminal ESI if no improvement  Hyper reflexia, consider cervical spine imaging if any weakness , gait or UE sensory symptoms develop

## 2023-01-20 ENCOUNTER — Ambulatory Visit: Payer: Medicare Other | Admitting: Orthopedic Surgery

## 2023-01-20 ENCOUNTER — Encounter: Payer: Self-pay | Admitting: Orthopedic Surgery

## 2023-01-20 VITALS — BP 121/74 | HR 89 | Ht 65.0 in | Wt 156.0 lb

## 2023-01-20 DIAGNOSIS — M5416 Radiculopathy, lumbar region: Secondary | ICD-10-CM | POA: Diagnosis not present

## 2023-01-20 MED ORDER — NAPROXEN 500 MG PO TABS: 500.0000 mg | ORAL_TABLET | Freq: Two times a day (BID) | ORAL | 1 refills | Status: DC

## 2023-01-20 MED ORDER — PREGABALIN 50 MG PO CAPS: 50.0000 mg | ORAL_CAPSULE | Freq: Two times a day (BID) | ORAL | 1 refills | Status: DC

## 2023-01-20 NOTE — Progress Notes (Signed)
Orthopedic Spine Surgery Office Note   Assessment: Patient is a 82 y.o. male with chronic low back pain and sagittal imbalance (T1 pelvis angle 24, SVA of 7) and radicular left leg pain in L4 or L5 distribution      Plan: -Patient has tried activity modification, naproxen, tylenol, injections, lyrica -Should keep his appt with PM&R for possible injection -Represcribed lyrica and naproxen since they are helping. He had side effects with the 100mg  dose of lyrica so prescribed the 50mg  dose -If he is not doing any better at our next visit will recommend MRI of the lumbar spine -Patient should return to office in 8 weeks, x-rays at next visit: AP/lateral/flex/ex lumbar     Patient expressed understanding of the plan and all questions were answered to the patient's satisfaction.    ___________________________________________________________________________   History: Patient is a 82 y.o. male who has been previously seen in the office for symptoms of low back pain.  Pain has been tolerable with naproxen, Lyrica, and periodic injections.  His back pain does radiate into the left medial leg and dorsal foot.  He does not have any radicular symptoms on the right side.  He is interested in trying another injection since they have helped in the past.  No new symptoms since the last time I saw him.  Denies paresthesias and numbness.  No bowel or bladder incontinence.  No saddle anesthesia.   Previous treatments: activity modification, NSAIDs, tylenol, injections   Physical Exam:   General: no acute distress, appears stated age Neurologic: alert, answering questions appropriately, following commands Respiratory: unlabored breathing on room air, symmetric chest rise Psychiatric: appropriate affect, normal cadence to speech     MSK (spine):   -Strength exam                                                   Left                  Right EHL                              4/5                  4/5 TA                                  5/5                  5/5 GSC                             5/5                  5/5 Knee extension            5/5                  5/5 Hip flexion                    5/5                  5/5   -Sensory exam  Sensation intact to light touch in L3-S1 nerve distributions of bilateral lower extremities   -Straight leg raise: negative -Contralateral straight leg raise: negative -Femoral nerve stretch test: negative bilaterally -Clonus: no beats bilaterally   Imaging: XR of the lumbar spine from 07/26/2022 was previously independently reviewed and interpreted, showing lumbar degenerative scoliosis with apex to the left at L2/3. Disc height loss at multiple levels with facet arthropathy. No evidence of instability on flexion/extension. No fracture or dislocation. PI of 58. LL of 30.     XR scoliosis from 09/25/2022 was previously independently reviewed and interpreted, showing LL of 34, PI of 56. SVA of 7cm. T1 pelvis angle of 24. 1.2 cm of coronal imbalance from the CSVL.     Patient name: Jesse [name] Patient MRN: [id number] Date of visit: 01/20/23

## 2023-01-21 ENCOUNTER — Encounter: Payer: Self-pay | Admitting: Physical Medicine & Rehabilitation

## 2023-01-21 ENCOUNTER — Encounter: Payer: Medicare Other | Attending: Physical Medicine & Rehabilitation | Admitting: Physical Medicine & Rehabilitation

## 2023-01-21 VITALS — BP 112/70 | HR 78 | Ht 65.0 in | Wt 153.0 lb

## 2023-01-21 DIAGNOSIS — M47816 Spondylosis without myelopathy or radiculopathy, lumbar region: Secondary | ICD-10-CM | POA: Diagnosis not present

## 2023-01-21 DIAGNOSIS — M48062 Spinal stenosis, lumbar region with neurogenic claudication: Secondary | ICD-10-CM | POA: Diagnosis not present

## 2023-01-21 NOTE — Patient Instructions (Signed)
Back Exercises These exercises help to make your trunk and back strong. They also help to keep the lower back flexible. Doing these exercises can help to prevent or lessen pain in your lower back. If you have back pain, try to do these exercises 2-3 times each day or as told by your doctor. As you get better, do the exercises once each day. Repeat the exercises more often as told by your doctor. To stop back pain from coming back, do the exercises once each day, or as told by your doctor. Do exercises exactly as told by your doctor. Stop right away if you feel sudden pain or your pain gets worse. Exercises Single knee to chest Do these steps 3-5 times in a row for each leg: Lie on your back on a firm bed or the floor with your legs stretched out. Bring one knee to your chest. Grab your knee or thigh with both hands and hold it in place. Pull on your knee until you feel a gentle stretch in your lower back or butt. Keep doing the stretch for 10-30 seconds. Slowly let go of your leg and straighten it. Pelvic tilt Do these steps 5-10 times in a row: Lie on your back on a firm bed or the floor with your legs stretched out. Bend your knees so they point up to the ceiling. Your feet should be flat on the floor. Tighten your lower belly (abdomen) muscles to press your lower back against the floor. This will make your tailbone point up to the ceiling instead of pointing down to your feet or the floor. Stay in this position for 5-10 seconds while you gently tighten your muscles and breathe evenly. Cat-cow Do these steps until your lower back bends more easily: Get on your hands and knees on a firm bed or the floor. Keep your hands under your shoulders, and keep your knees under your hips. You may put padding under your knees. Let your head hang down toward your chest. Tighten (contract) the muscles in your belly. Point your tailbone toward the floor so your lower back becomes rounded like the back of a  cat. Stay in this position for 5 seconds. Slowly lift your head. Let the muscles of your belly relax. Point your tailbone up toward the ceiling so your back forms a sagging arch like the back of a cow. Stay in this position for 5 seconds.  Press-ups Do these steps 5-10 times in a row: Lie on your belly (face-down) on a firm bed or the floor. Place your hands near your head, about shoulder-width apart. While you keep your back relaxed and keep your hips on the floor, slowly straighten your arms to raise the top half of your body and lift your shoulders. Do not use your back muscles. You may change where you place your hands to make yourself more comfortable. Stay in this position for 5 seconds. Keep your back relaxed. Slowly return to lying flat on the floor.  Bridges Do these steps 10 times in a row: Lie on your back on a firm bed or the floor. Bend your knees so they point up to the ceiling. Your feet should be flat on the floor. Your arms should be flat at your sides, next to your body. Tighten your butt muscles and lift your butt off the floor until your waist is almost as high as your knees. If you do not feel the muscles working in your butt and the back of   your thighs, slide your feet 1-2 inches (2.5-5 cm) farther away from your butt. Stay in this position for 3-5 seconds. Slowly lower your butt to the floor, and let your butt muscles relax. If this exercise is too easy, try doing it with your arms crossed over your chest. Belly crunches Do these steps 5-10 times in a row: Lie on your back on a firm bed or the floor with your legs stretched out. Bend your knees so they point up to the ceiling. Your feet should be flat on the floor. Cross your arms over your chest. Tip your chin a little bit toward your chest, but do not bend your neck. Tighten your belly muscles and slowly raise your chest just enough to lift your shoulder blades a tiny bit off the floor. Avoid raising your body  higher than that because it can put too much stress on your lower back. Slowly lower your chest and your head to the floor. Back lifts Do these steps 5-10 times in a row: Lie on your belly (face-down) with your arms at your sides, and rest your forehead on the floor. Tighten the muscles in your legs and your butt. Slowly lift your chest off the floor while you keep your hips on the floor. Keep the back of your head in line with the curve in your back. Look at the floor while you do this. Stay in this position for 3-5 seconds. Slowly lower your chest and your face to the floor. Contact a doctor if: Your back pain gets a lot worse when you do an exercise. Your back pain does not get better within 2 hours after you exercise. If you have any of these problems, stop doing the exercises. Do not do them again unless your doctor says it is okay. Get help right away if: You have sudden, very bad back pain. If this happens, stop doing the exercises. Do not do them again unless your doctor says it is okay. This information is not intended to replace advice given to you by your health care provider. Make sure you discuss any questions you have with your health care provider. Document Revised: 11/22/2020 Document Reviewed: 11/22/2020 Elsevier Patient Education  2023 [employer]  

## 2023-01-21 NOTE — Progress Notes (Signed)
Subjective:    Patient ID: Jesse [name], male    DOB: Jan 02, 1941, 82 y.o.   MRN: [id number]  HPI   Moderate low back pain radiating down the left lower extremity still having numbness in the left big toe Lyrica 100mg  qhs, helpful Naproxen 500mg /day- precribed by ortho Had PT for same back pain issue completed on 02/15/2021 still does home exercise program from PT, and specifically mentioned doing pelvic lifts yesterday  Seen by Ortho spine surgeon yesterday, lumbar injection was recommended for his radicular pain down the left lower extremity.   Pain Inventory Average Pain 3 Pain Right Now 3 My pain is dull and aching  In the last 24 hours, has pain interfered with the following? General activity 4 Relation with others 5 Enjoyment of life 4 What TIME of day is your pain at its worst? morning  Sleep (in general) Fair  Pain is worse with: standing Pain improves with: rest Relief from Meds: 5  Family History  Problem Relation Age of Onset   Heart attack Mother    Social History   Socioeconomic History   Marital status: Single    Spouse name: Not on file   Number of children: Not on file   Years of education: Not on file   Highest education level: Not on file  Occupational History    Comment: retired  Tobacco Use   Smoking status: Never   Smokeless tobacco: Never   Tobacco comments:    never used tobacco  Vaping Use   Vaping Use: Never used  Substance and Sexual Activity   Alcohol use: No    Alcohol/week: 0.0 standard drinks of alcohol   Drug use: No   Sexual activity: Not Currently  Other Topics Concern   Not on file  Social History Narrative   Not on file   Social Determinants of Health   Financial Resource Strain: Not on file  Food Insecurity: Not on file  Transportation Needs: Not on file  Physical Activity: Not on file  Stress: Not on file  Social Connections: Not on file   Past Surgical History:  Procedure Laterality Date   GAS INSERTION   06/04/2012   Procedure: INSERTION OF GAS;  Surgeon: Sherrie George, MD;  Location: [facility];  Service: Ophthalmology;  Laterality: Left;  C3F8   SCLERAL BUCKLE  06/04/2012   Procedure: SCLERAL BUCKLE;  Surgeon: Sherrie George, MD;  Location: [facility] OR;  Service: Ophthalmology;  Laterality: Left;  Headscope laser   Past Surgical History:  Procedure Laterality Date   GAS INSERTION  06/04/2012   Procedure: INSERTION OF GAS;  Surgeon: Sherrie George, MD;  Location: [facility] OR;  Service: Ophthalmology;  Laterality: Left;  C3F8   SCLERAL BUCKLE  06/04/2012   Procedure: SCLERAL BUCKLE;  Surgeon: Sherrie George, MD;  Location: [facility] OR;  Service: Ophthalmology;  Laterality: Left;  Headscope laser   Past Medical History:  Diagnosis Date   Depression    Hypercholesteremia    Hypertension    HZ (herpes zoster) 09/02/2011   Neuritis    left foot   Polycythemia rubra vera ([facility]) 09/02/2011   BP 112/70   Pulse 78   Ht 5\' 5"  (1.651 m)   Wt 153 lb (69.4 kg)   SpO2 95%   BMI 25.46 kg/m   Opioid Risk Score:   Fall Risk Score:  `1  Depression screen [facility] 2/9     01/21/2023   10:16 AM 12/12/2022   10:24 AM  12/28/2013    9:00 AM  Depression screen PHQ 2/9  Decreased Interest 0 2 0  Down, Depressed, Hopeless 0 1 0  PHQ - 2 Score 0 3 0  Altered sleeping  1   Tired, decreased energy  2   Change in appetite  0   Feeling bad or failure about yourself   0   Trouble concentrating  0   Moving slowly or fidgety/restless  0   Suicidal thoughts  0   PHQ-9 Score  6       Review of Systems  All other systems reviewed and are negative.     Objective:   Physical Exam General no acute distress Mood and affect appropriate Extremities without edema Motor strength is 5/5 bilateral hip flexor knee extensor ankle dorsiflexors 5/5 right EHL 4/5 left EHL Sensation reduced left L5 dermatome to light touch equal sensation at L4 as well as S1 Negative straight leg raise bilaterally Ambulates without assistive device  no evidence of toe drag or knee instability       Assessment & Plan:  1.  Lumbar spinal stenosis multilevel symptomatic nerve root.  He has failed ongoing physical therapy which she has kept up with over the last 2 years with only partial relief with Lyrica as well as nonsteroidal anti-inflammatory.  Would recommend epidural steroid injection targeting left 5 spinal nerve root Because of the severe stenosis at L5-S1  L4-5 retroneural ESI targeting inf aspect of foramina, reviewed MRI showing good access at this level typical caudal spread of injectate using this technique

## 2023-01-23 ENCOUNTER — Ambulatory Visit: Payer: Medicare Other | Admitting: Physical Medicine & Rehabilitation

## 2023-02-03 ENCOUNTER — Inpatient Hospital Stay: Payer: Medicare Other

## 2023-02-03 ENCOUNTER — Other Ambulatory Visit: Payer: Self-pay

## 2023-02-03 ENCOUNTER — Inpatient Hospital Stay: Payer: Medicare Other | Attending: Family

## 2023-02-03 VITALS — BP 102/63 | HR 100 | Temp 98.0°F | Resp 17

## 2023-02-03 DIAGNOSIS — D751 Secondary polycythemia: Secondary | ICD-10-CM | POA: Insufficient documentation

## 2023-02-03 DIAGNOSIS — D45 Polycythemia vera: Secondary | ICD-10-CM

## 2023-02-03 DIAGNOSIS — D500 Iron deficiency anemia secondary to blood loss (chronic): Secondary | ICD-10-CM

## 2023-02-03 LAB — CBC WITH DIFFERENTIAL (CANCER CENTER ONLY)
Abs Immature Granulocytes: 0.02 10*3/uL (ref 0.00–0.07)
Basophils Absolute: 0 10*3/uL (ref 0.0–0.1)
Basophils Relative: 1 %
Eosinophils Absolute: 0.1 10*3/uL (ref 0.0–0.5)
Eosinophils Relative: 2 %
HCT: 47.7 % (ref 39.0–52.0)
Hemoglobin: 15.8 g/dL (ref 13.0–17.0)
Immature Granulocytes: 0 %
Lymphocytes Relative: 27 %
Lymphs Abs: 1.8 10*3/uL (ref 0.7–4.0)
MCH: 29.6 pg (ref 26.0–34.0)
MCHC: 33.1 g/dL (ref 30.0–36.0)
MCV: 89.5 fL (ref 80.0–100.0)
Monocytes Absolute: 0.9 10*3/uL (ref 0.1–1.0)
Monocytes Relative: 13 %
Neutro Abs: 3.8 10*3/uL (ref 1.7–7.7)
Neutrophils Relative %: 57 %
Platelet Count: 238 10*3/uL (ref 150–400)
RBC: 5.33 MIL/uL (ref 4.22–5.81)
RDW: 14.3 % (ref 11.5–15.5)
WBC Count: 6.7 10*3/uL (ref 4.0–10.5)
nRBC: 0 % (ref 0.0–0.2)

## 2023-02-03 LAB — CMP (CANCER CENTER ONLY)
ALT: 19 U/L (ref 0–44)
AST: 16 U/L (ref 15–41)
Albumin: 4.4 g/dL (ref 3.5–5.0)
Alkaline Phosphatase: 47 U/L (ref 38–126)
Anion gap: 4 — ABNORMAL LOW (ref 5–15)
BUN: 16 mg/dL (ref 8–23)
CO2: 26 mmol/L (ref 22–32)
Calcium: 10.5 mg/dL — ABNORMAL HIGH (ref 8.9–10.3)
Chloride: 99 mmol/L (ref 98–111)
Creatinine: 0.78 mg/dL (ref 0.61–1.24)
GFR, Estimated: >60 mL/min (ref >60)
Glucose, Bld: 107 mg/dL — ABNORMAL HIGH (ref 70–99)
Potassium: 4.1 mmol/L (ref 3.5–5.1)
Sodium: 129 mmol/L — ABNORMAL LOW (ref 135–145)
Total Bilirubin: 1.1 mg/dL (ref 0.3–1.2)
Total Protein: 6.9 g/dL (ref 6.5–8.1)

## 2023-02-03 LAB — IRON AND IRON BINDING CAPACITY (CC-WL,HP ONLY)
Iron: 68 ug/dL (ref 45–182)
Saturation Ratios: 16 % — ABNORMAL LOW (ref 17.9–39.5)
TIBC: 433 ug/dL (ref 250–450)
UIBC: 365 ug/dL (ref 117–376)

## 2023-02-03 LAB — FERRITIN: Ferritin: 13 ng/mL — ABNORMAL LOW (ref 24–336)

## 2023-02-03 NOTE — Progress Notes (Signed)
Fran Lowes presents today for phlebotomy per MD orders. Phlebotomy procedure started at 1052 and ended at 1108. 515 grams removed via 20 gauge needle to left AC. Patient observed for 30 minutes after procedure without any incident. Patient tolerated procedure well. IV needle removed intact. Pt given nourishment and drink while in infusion area.

## 2023-02-03 NOTE — Patient Instructions (Signed)

## 2023-02-04 DIAGNOSIS — H31001 Unspecified chorioretinal scars, right eye: Secondary | ICD-10-CM | POA: Diagnosis not present

## 2023-02-13 ENCOUNTER — Encounter: Payer: Medicare Other | Attending: Physical Medicine & Rehabilitation | Admitting: Physical Medicine & Rehabilitation

## 2023-02-13 ENCOUNTER — Encounter: Payer: Self-pay | Admitting: Physical Medicine & Rehabilitation

## 2023-02-13 VITALS — BP 131/75 | HR 82 | Ht 65.0 in | Wt 152.0 lb

## 2023-02-13 DIAGNOSIS — M48062 Spinal stenosis, lumbar region with neurogenic claudication: Secondary | ICD-10-CM | POA: Diagnosis not present

## 2023-02-13 DIAGNOSIS — M47816 Spondylosis without myelopathy or radiculopathy, lumbar region: Secondary | ICD-10-CM | POA: Diagnosis not present

## 2023-02-13 MED ORDER — DEXAMETHASONE SODIUM PHOSPHATE 10 MG/ML IJ SOLN
10.0000 mg | Freq: Once | INTRAMUSCULAR | Status: AC
Start: 2023-02-13 — End: 2023-02-13
  Administered 2023-02-13: 10 mg

## 2023-02-13 MED ORDER — LIDOCAINE HCL 1 % IJ SOLN
5.0000 mL | Freq: Once | INTRAMUSCULAR | Status: AC
Start: 2023-02-13 — End: 2023-02-13
  Administered 2023-02-13: 5 mL

## 2023-02-13 MED ORDER — LIDOCAINE HCL (PF) 1 % IJ SOLN
2.0000 mL | Freq: Once | INTRAMUSCULAR | Status: AC
Start: 2023-02-13 — End: 2023-02-13
  Administered 2023-02-13: 2 mL

## 2023-02-13 MED ORDER — IOHEXOL 180 MG/ML  SOLN
2.0000 mL | Freq: Once | INTRAMUSCULAR | Status: AC
  Administered 2023-02-13: 2 mL

## 2023-02-13 NOTE — Patient Instructions (Signed)

## 2023-02-13 NOTE — Progress Notes (Signed)
  PROCEDURE RECORD [facility]   Name: Jesse [name] DOB:09-14-1941 MRN: [id number]  Date:02/13/2023  Physician: Claudette Laws, MD    Nurse/CMA: Shrey Boike RMA   Allergies:  Allergies  Allergen Reactions   Hctz [Hydrochlorothiazide] Other (See Comments)    Hyponatremia ; Na+ 111, w weakness resulting in mechanical fall   Tramadol Nausea And Vomiting    Consent Signed: Yes.    Is patient diabetic? No.  CBG today? .  Pregnant: No. LMP: No LMP for male patient. (age 77-55)  Anticoagulants: no Anti-inflammatory: no Antibiotics: no  Procedure: Left L5-S1 Epidural Steroid Injection  Position: Prone Start Time: 10:58AM  End Time: 11:04  Fluoro Time: 27  RN/CMA Akia Montalban RMA  Eamon Tantillo RMA     Time 10:30 11:08    BP 131/75 144/87    Pulse 82 83    Respirations 16 16    O2 Sat 97 97    S/S 6 6    Pain Level 4/10 0/10     D/C home with Sister, patient A & O X 3, D/C instructions reviewed, and sits independently.

## 2023-02-13 NOTE — Progress Notes (Signed)
Left L5-S1 Lumbar transforaminal epidural steroid injection under fluoroscopic guidance with contrast enhancement  Indication: Lumbosacral radiculitis is not relieved by medication management or other conservative care and interfering with self-care and mobility.   Informed consent was obtained after describing risk and benefits of the procedure with the patient, this includes bleeding, bruising, infection, paralysis and medication side effects.  The patient wishes to proceed and has given written consent.  Patient was placed in prone position.  The lumbar area was marked and prepped with Betadine.  It was entered with a 25-gauge 1-1/2 inch needle and one mL of 1% lidocaine was injected into the skin and subcutaneous tissue.  Then a 22-gauge 3.5 in spinal needle was inserted into the LEFT intervertebral foramen under AP, lateral, and oblique view.  Once needle tip was within the foramen on lateral views an dnor exceeding 6 o clock position on th epedical on AP viewed Isovue 200 was inected x 2ml Then a solution containing one mL of 10 mg per mL dexamethasone and 2 mL of 1% lidocaine was injected.  The patient tolerated procedure well.  Post procedure instructions were given.  Please see post procedure form.   Preinjection 4/10 Post injection 0/10

## 2023-02-19 ENCOUNTER — Ambulatory Visit: Payer: Medicare Other | Admitting: Orthopedic Surgery

## 2023-02-19 ENCOUNTER — Other Ambulatory Visit (INDEPENDENT_AMBULATORY_CARE_PROVIDER_SITE_OTHER): Payer: Medicare Other

## 2023-02-19 DIAGNOSIS — M5416 Radiculopathy, lumbar region: Secondary | ICD-10-CM | POA: Diagnosis not present

## 2023-02-19 NOTE — Progress Notes (Signed)
Orthopedic Spine Surgery Office Note  Assessment: Patient is a 82 y.o. male with with chronic low back pain and sagittal imbalance (T1 pelvis angle 24, SVA of 7) and radicular left leg pain in L4 or L5 distribution    Plan: -Explained that initially conservative treatment is tried as a significant number of patients may experience relief with these treatment modalities. Discussed that the conservative treatments include:  -activity modification  -physical therapy  -over the counter pain medications  -medrol dosepak  -lumbar steroid injections -Patient has tried activity modification, naproxen, tylenol, injections, lyrica  -Since he has tried conservative treatments for 6 weeks now without any significant relief, recommended MRI of the lumbar spine to evaluate for radiculopathy -Patient should return to office in 4 weeks, x-rays at next visit: None   Patient expressed understanding of the plan and all questions were answered to the patient's satisfaction.   ___________________________________________________________________________  History: Patient is a 82 y.o. male who has been previously seen in the office for symptoms of low back pain and left leg pain.  He feels the left leg pain in the anterior lateral aspect into the dorsal aspect of the foot.  His low back pain has gotten slightly better since the last time I saw him.  He is having more leg pain though.  No pain in the right lower extremity.  Denies paresthesias and numbness.  Previous treatments: activity modification, naproxen, tylenol, injections, lyrica   Physical Exam:  General: no acute distress, appears stated age Neurologic: alert, answering questions appropriately, following commands Respiratory: unlabored breathing on room air, symmetric chest rise Psychiatric: appropriate affect, normal cadence to speech   MSK (spine):  -Strength exam      Left  Right EHL    4/5  4/5 TA    5/5  5/5 GSC    5/5  5/5 Knee  extension  5/5  5/5 Hip flexion   5/5  5/5  -Sensory exam    Sensation intact to light touch in L3-S1 nerve distributions of bilateral lower extremities  -Straight leg raise: Negative bilaterally -Femoral nerve stretch test: Negative bilaterally -Clonus: no beats bilaterally  Imaging: XR of the lumbar spine from 02/19/2023 was independently reviewed and interpreted, showing PI-LL mismatch.  No fracture or dislocation seen.  Disc height loss at T12/L1, L1/L2, L2/L3, L5/S1.  There is anterior osteophyte formation seen at those levels as well.  Lumbar scoliosis with apex to the left at L2/3 disc space.   Patient name: Jesse [name] Patient MRN: [id number] Date of visit: 02/19/23

## 2023-02-20 ENCOUNTER — Ambulatory Visit: Payer: Medicare Other | Admitting: Physical Therapy

## 2023-02-23 ENCOUNTER — Encounter: Payer: Self-pay | Admitting: Family

## 2023-02-27 ENCOUNTER — Encounter: Payer: Self-pay | Admitting: Physical Therapy

## 2023-02-27 ENCOUNTER — Ambulatory Visit: Payer: Medicare HMO | Attending: Physical Medicine & Rehabilitation | Admitting: Physical Therapy

## 2023-02-27 ENCOUNTER — Other Ambulatory Visit: Payer: Self-pay

## 2023-02-27 VITALS — BP 135/79 | HR 90

## 2023-02-27 DIAGNOSIS — M4126 Other idiopathic scoliosis, lumbar region: Secondary | ICD-10-CM | POA: Insufficient documentation

## 2023-02-27 DIAGNOSIS — M48062 Spinal stenosis, lumbar region with neurogenic claudication: Secondary | ICD-10-CM | POA: Diagnosis not present

## 2023-02-27 DIAGNOSIS — M79605 Pain in left leg: Secondary | ICD-10-CM | POA: Insufficient documentation

## 2023-02-27 DIAGNOSIS — M5459 Other low back pain: Secondary | ICD-10-CM | POA: Insufficient documentation

## 2023-02-27 NOTE — Therapy (Unsigned)
OUTPATIENT PHYSICAL THERAPY THORACOLUMBAR EVALUATION   Patient Name: Jesse [name] MRN: [id number] DOB:1941/08/08, 82 y.o., male Today's Date: 02/27/2023  END OF SESSION:  PT End of Session - 02/27/23 1502     Visit Number 1    Number of Visits 7   6 + eval   Date for PT Re-Evaluation 04/18/23   pushed out due to limited patient availability   Authorization Type [employer] MEDICARE   pt had recent switch from [facility] MEDICARE   Progress Note Due on Visit 10    PT Start Time 1453    PT Stop Time 1539    PT Time Calculation (min) 46 min    Behavior During Therapy [facility] for tasks assessed/performed             Past Medical History:  Diagnosis Date   Depression    Hypercholesteremia    Hypertension    HZ (herpes zoster) 09/02/2011   Neuritis    left foot   Polycythemia rubra vera (HCC) 09/02/2011   Past Surgical History:  Procedure Laterality Date   GAS INSERTION  06/04/2012   Procedure: INSERTION OF GAS;  Surgeon: Sherrie George, MD;  Location: [facility] OR;  Service: Ophthalmology;  Laterality: Left;  [id number]   SCLERAL BUCKLE  06/04/2012   Procedure: SCLERAL BUCKLE;  Surgeon: Sherrie George, MD;  Location: [facility] OR;  Service: Ophthalmology;  Laterality: Left;  Headscope laser   Patient Active Problem List   Diagnosis Date Noted   UPJ obstruction, acquired 03/02/2021   Diverticulosis 03/02/2021   Cervical spondylosis 03/02/2021   Unspecified protein-calorie malnutrition (HCC) 03/02/2021   Transaminitis 03/02/2021   Hyponatremia 02/22/2021   Essential hypertension 02/22/2021   Hyperlipidemia 02/22/2021   Fall at home, subsequent encounter 02/22/2021   Need for prophylactic vaccination and inoculation against influenza 06/17/2016   Polycythemia rubra vera (HCC) 09/02/2011   HZ (herpes zoster) 09/02/2011    PCP: Georgann Housekeeper, MD  REFERRING PROVIDER: Erick Colace, MD  REFERRING DIAG: [phone] (ICD-10-CM) - Spinal stenosis of lumbar region with neurogenic  claudication  Rationale for Evaluation and Treatment: Rehabilitation  THERAPY DIAG:  Other low back pain  Pain in left leg  Other idiopathic scoliosis, lumbar region  ONSET DATE: 2 years ago  SUBJECTIVE:                                                                                                                                                                                           SUBJECTIVE STATEMENT: Pt reports he has had falls in the past and he feels this occurred due to the leg and back pain.  His  back pain has progressed over 2 years.  Pt does not live an active lifestyle currently per report.  He used to play golf about 5-6 years ago.  PERTINENT HISTORY:  HTN, HLD, cervical spondylosis  PAIN:  Are you having pain? No  PRECAUTIONS: Fall  WEIGHT BEARING RESTRICTIONS: No  FALLS:  Has patient fallen in last 6 months? No  LIVING ENVIRONMENT: Lives with: lives with their family and disabled sister lives with him; caregiver present 8 hrs M-F, younger sister provides care on weekends. Lives in: House/apartment Stairs: Yes: Internal: 14-16 steps; on left going up and External: 5 steps; on right going up Has following equipment at home: Single point cane, Walker - 2 wheeled, shower chair, bed side commode, and Grab bars  OCCUPATION: Retired  PLOF: Independent  PATIENT GOALS: "To get better"  NEXT MD VISIT: Erick Colace, MD 04/01/2023 (sees Dr. Christell Constant in orthopedics on 04/02/2023)  OBJECTIVE:   DIAGNOSTIC FINDINGS:  Upcoming Lumbar MRI 03/28/2023  Lumbar x-ray 02/19/2023: XR of the lumbar spine from 02/19/2023 was independently reviewed and  interpreted, showing PI-LL mismatch.  No fracture or dislocation seen.   Disc height loss at T12/L1, L1/L2, L2/L3, L5/S1.  There is anterior  osteophyte formation seen at those levels as well.  Lumbar scoliosis with  apex to the left at L2/3 disc space.   PATIENT SURVEYS:  Modified Oswestry 9/50 = mild disability    SCREENING FOR RED FLAGS: Bowel or bladder incontinence: No Spinal tumors: No Cauda equina syndrome: No Compression fracture: No Abdominal aneurysm: No  COGNITION: Overall cognitive status: Within functional limits for tasks assessed     SENSATION: Light touch: WFL  POSTURE: rounded shoulders, forward head, increased thoracic kyphosis, and flexed trunk   PALPATION: Not TTP over entire lumbar region and bilateral lumbar flank areas  LUMBAR ROM:   AROM eval  Flexion   Extension Limited ~20-25%  Right lateral flexion WFL  Left lateral flexion WFL; mild pain in left leg  Right rotation WFL  Left rotation WFL   (Blank rows = not tested)  LOWER EXTREMITY ROM:     Active  Right eval Left eval  Hip flexion Grossly WFL  Hip extension   Hip abduction   Hip adduction   Hip internal rotation   Hip external rotation   Knee flexion   Knee extension   Ankle dorsiflexion   Ankle plantarflexion   Ankle inversion   Ankle eversion    (Blank rows = not tested)  LOWER EXTREMITY MMT:    MMT Right eval Left eval  Hip flexion 4+/5 4/5  Hip extension    Hip abduction 4/5 4/5  Hip adduction 3+/5 3+/5  Hip internal rotation    Hip external rotation    Knee flexion 4/5 4/5  Knee extension 5/5 5/5  Ankle dorsiflexion 4+/5 4-/5  Ankle plantarflexion    Ankle inversion    Ankle eversion     (Blank rows = not tested)  LUMBAR SPECIAL TESTS:  Straight leg raise test: Negative and Slump test: Negative-calf tightness noted, no N/T  FUNCTIONAL TESTS:  6 minute walk test: To be assessed.  GAIT: Distance walked: Various clinic distances Assistive device utilized: None Level of assistance: Complete Independence Comments: Kyphotic posture maintained during ambulation.  TODAY'S TREATMENT:  DATE: N/A    PATIENT EDUCATION:  Education details: PT POC,  assessments used and to be used, and objective goals to be set. Person educated: Patient Education method: Explanation Education comprehension: verbalized understanding and needs further education  HOME EXERCISE PROGRAM: To be established w/ walking program.  ASSESSMENT:  CLINICAL IMPRESSION: Patient is a 82 y.o. male who was seen today for physical therapy evaluation and treatment for left lumbar radiculopathy.  Pt has a significant PMH of HTN, HLD,  and cervical spondylosis.  Identified impairments include inactive lifestyle, increased kyphotic posture with rounded shoulders and forward head, mildly limited lumbar extension, mild proximal LE weakness, and decreased activity tolerance.  Evaluation via the following assessment tools: Modified Oswestry Index indicate mild perceived disability related to back pain.  He would benefit from skilled PT to address impairments as noted and progress towards long term goals.  OBJECTIVE IMPAIRMENTS: decreased activity tolerance, decreased mobility, difficulty walking, decreased ROM, decreased strength, hypomobility, improper body mechanics, postural dysfunction, and pain.   ACTIVITY LIMITATIONS: carrying, lifting, bending, and squatting  PARTICIPATION LIMITATIONS: community activity  PERSONAL FACTORS: Age, Fitness, Past/current experiences, and 1 comorbidity: cervical spondylosis  are also affecting patient's functional outcome.   REHAB POTENTIAL: Good  CLINICAL DECISION MAKING: Stable/uncomplicated  EVALUATION COMPLEXITY: Low   GOALS: Goals reviewed with patient? Yes  SHORT TERM GOALS: Target date: 03/21/2023  Pt will be independent with strengthening HEP to improve functional mobility and overall pain. Baseline:  To be established. Goal status: INITIAL  2.  Walking program to be established with compliance >/=3 days per week to improve aerobic tolerance and promote improved joint mobility. Baseline: To be established. Goal status:  INITIAL  LONG TERM GOALS: Target date: 04/11/2023  Patient will improve Modified Oswestry score to </=5/50 to indicate decreased perceived disability related to back pain. Baseline: 9/50 Goal status: INITIAL  2.  to be assessed w/ LTG set as appropriate. Baseline: To be assessed. Goal status: INITIAL  PLAN:  PT FREQUENCY: 1x/week  PT DURATION: 6 weeks  PLANNED INTERVENTIONS: Therapeutic exercises, Therapeutic activity, Neuromuscular re-education, Balance training, Gait training, Patient/Family education, Self Care, Joint mobilization, Stair training, DME instructions, Electrical stimulation, Spinal mobilization, Cryotherapy, Moist heat, Manual therapy, and Re-evaluation.  PLAN FOR NEXT SESSION: Assess 6MWT-set LTG.  HEP for pain management, LE/core strength, and stretching.  Establish walking program.   Sadie Haber, PT, DPT 02/27/2023, 3:39 PM

## 2023-03-03 ENCOUNTER — Encounter: Payer: Self-pay | Admitting: Family

## 2023-03-04 ENCOUNTER — Encounter: Payer: Self-pay | Admitting: Physical Therapy

## 2023-03-04 ENCOUNTER — Ambulatory Visit: Payer: Medicare HMO | Admitting: Physical Therapy

## 2023-03-04 DIAGNOSIS — M4126 Other idiopathic scoliosis, lumbar region: Secondary | ICD-10-CM

## 2023-03-04 DIAGNOSIS — M79605 Pain in left leg: Secondary | ICD-10-CM

## 2023-03-04 DIAGNOSIS — M5459 Other low back pain: Secondary | ICD-10-CM

## 2023-03-04 DIAGNOSIS — M48062 Spinal stenosis, lumbar region with neurogenic claudication: Secondary | ICD-10-CM | POA: Diagnosis not present

## 2023-03-04 NOTE — Patient Instructions (Addendum)
You Can Walk For A Certain Length Of Time Each Day                          Walk 4 minutes 2 times per day.             Increase 1  minutes every 7 days              Work up to 20 minutes (1 times per day).               Example:                         Day 1-2           4-5 minutes     3 times per day                         Day 7-8           10-12 minutes 2-3 times per day                         Day 13-14       20-22 minutes 1-2 times per day  Access Code: [email or web address] URL: [email or web address] Date: 03/04/2023 Prepared by: Camille Bal  Exercises - Supine Bridge  - 1 x daily - 5 x weekly - 3 sets - 10 reps - Supine Piriformis Stretch with Foot on Ground  - 1 x daily - 5 x weekly - 1 sets - 3 reps - 45 seconds hold - Gastroc Stretch on Wall  - 1 x daily - 5 x weekly - 1 sets - 2-3 reps - 30 seconds hold - Supine Lower Trunk Rotation  - 1 x daily - 5 x weekly - 2 sets - 20 reps - 1 second hold - Supine Sciatic Nerve Glide  - 1 x daily - 5 x weekly - 1 sets - 20 reps - 2 seconds hold

## 2023-03-04 NOTE — Therapy (Signed)
OUTPATIENT PHYSICAL THERAPY THORACOLUMBAR TREATMENT   Patient Name: Jesse [name] MRN: [id number] DOB:10-14-1940, 82 y.o., male Today's Date: 03/04/2023  END OF SESSION:  PT End of Session - 03/04/23 0847     Visit Number 2    Number of Visits 7   6 + eval   Date for PT Re-Evaluation 04/18/23   pushed out due to limited patient availability   Authorization Type [employer] MEDICARE   pt had recent switch from [facility] MEDICARE   Progress Note Due on Visit 10    PT Start Time 0845    PT Stop Time 0927    PT Time Calculation (min) 42 min    Equipment Utilized During Treatment Gait belt    Activity Tolerance Patient tolerated treatment well    Behavior During Therapy WFL for tasks assessed/performed             Past Medical History:  Diagnosis Date   Depression    Hypercholesteremia    Hypertension    HZ (herpes zoster) 09/02/2011   Neuritis    left foot   Polycythemia rubra vera (HCC) 09/02/2011   Past Surgical History:  Procedure Laterality Date   GAS INSERTION  06/04/2012   Procedure: INSERTION OF GAS;  Surgeon: Sherrie George, MD;  Location: [facility] OR;  Service: Ophthalmology;  Laterality: Left;  C3F8   SCLERAL BUCKLE  06/04/2012   Procedure: SCLERAL BUCKLE;  Surgeon: Sherrie George, MD;  Location: [facility] OR;  Service: Ophthalmology;  Laterality: Left;  Headscope laser   Patient Active Problem List   Diagnosis Date Noted   UPJ obstruction, acquired 03/02/2021   Diverticulosis 03/02/2021   Cervical spondylosis 03/02/2021   Unspecified protein-calorie malnutrition (HCC) 03/02/2021   Transaminitis 03/02/2021   Hyponatremia 02/22/2021   Essential hypertension 02/22/2021   Hyperlipidemia 02/22/2021   Fall at home, subsequent encounter 02/22/2021   Need for prophylactic vaccination and inoculation against influenza 06/17/2016   Polycythemia rubra vera (HCC) 09/02/2011   HZ (herpes zoster) 09/02/2011    PCP: Georgann Housekeeper, MD  REFERRING PROVIDER: Erick Colace,  MD  REFERRING DIAG: [phone] (ICD-10-CM) - Spinal stenosis of lumbar region with neurogenic claudication  Rationale for Evaluation and Treatment: Rehabilitation  THERAPY DIAG:  Pain in left leg  Other idiopathic scoliosis, lumbar region  Other low back pain  ONSET DATE: 2 years ago  SUBJECTIVE:                                                                                                                                                                                           SUBJECTIVE STATEMENT: Pt denies falls or acute  changes since evaluation.  He does have some soreness today from working legs at home yesterday.  PERTINENT HISTORY:  HTN, HLD, cervical spondylosis  PAIN:  Are you having pain? Yes: NPRS scale: 1-2/10 Pain location: left lower leg Pain description: throbbing Aggravating factors: lifting heavy (>20 lbs) Relieving factors: relaxing  PRECAUTIONS: Fall  WEIGHT BEARING RESTRICTIONS: No  FALLS:  Has patient fallen in last 6 months? No  LIVING ENVIRONMENT: Lives with: lives with their family and disabled sister lives with him; caregiver present 8 hrs M-F, younger sister provides care on weekends. Lives in: House/apartment Stairs: Yes: Internal: 14-16 steps; on left going up and External: 5 steps; on right going up Has following equipment at home: Single point cane, Walker - 2 wheeled, shower chair, bed side commode, and Grab bars  OCCUPATION: Retired  PLOF: Independent  PATIENT GOALS: "To get better"  NEXT MD VISIT: Erick Colace, MD 04/01/2023 (sees Dr. Christell Constant in orthopedics on 04/02/2023)  OBJECTIVE:   DIAGNOSTIC FINDINGS:  Upcoming Lumbar MRI 03/28/2023  Lumbar x-ray 02/19/2023: XR of the lumbar spine from 02/19/2023 was independently reviewed and  interpreted, showing PI-LL mismatch.  No fracture or dislocation seen.   Disc height loss at T12/L1, L1/L2, L2/L3, L5/S1.  There is anterior  osteophyte formation seen at those levels as well.   Lumbar scoliosis with  apex to the left at L2/3 disc space.   PATIENT SURVEYS:  Modified Oswestry 9/50 = mild disability   SCREENING FOR RED FLAGS: Bowel or bladder incontinence: No Spinal tumors: No Cauda equina syndrome: No Compression fracture: No Abdominal aneurysm: No  COGNITION: Overall cognitive status: Within functional limits for tasks assessed     SENSATION: Light touch: WFL  POSTURE: rounded shoulders, forward head, increased thoracic kyphosis, and flexed trunk   PALPATION: Not TTP over entire lumbar region and bilateral lumbar flank areas  LUMBAR ROM:   AROM eval  Flexion   Extension Limited ~20-25%  Right lateral flexion WFL  Left lateral flexion WFL; mild pain in left leg  Right rotation WFL  Left rotation WFL   (Blank rows = not tested)  LOWER EXTREMITY ROM:     Active  Right eval Left eval  Hip flexion Grossly WFL  Hip extension   Hip abduction   Hip adduction   Hip internal rotation   Hip external rotation   Knee flexion   Knee extension   Ankle dorsiflexion   Ankle plantarflexion   Ankle inversion   Ankle eversion    (Blank rows = not tested)  LOWER EXTREMITY MMT:    MMT Right eval Left eval  Hip flexion 4+/5 4/5  Hip extension    Hip abduction 4/5 4/5  Hip adduction 3+/5 3+/5  Hip internal rotation    Hip external rotation    Knee flexion 4/5 4/5  Knee extension 5/5 5/5  Ankle dorsiflexion 4+/5 4-/5  Ankle plantarflexion    Ankle inversion    Ankle eversion     (Blank rows = not tested)  LUMBAR SPECIAL TESTS:  Straight leg raise test: Negative and Slump test: Negative-calf tightness noted, no N/T  FUNCTIONAL TESTS:  6 minute walk test: To be assessed.  GAIT: Distance walked: Various clinic distances Assistive device utilized: None Level of assistance: Complete Independence Comments: Kyphotic posture maintained during ambulation.  TODAY'S TREATMENT:  DATE: 03/04/2023 - no AD SBA:  785' w/ increased soreness around 500' mark on left lower leg, pt also requires single seated rest at 583';  He initially endorses cramping, but then states he hasn't had any cramping in years.  -You Can Walk For A Certain Length Of Time Each Day                          Walk 4 minutes 2 times per day.             Increase 1  minutes every 7 days              Work up to 20 minutes (1 times per day).               Example:                         Day 1-2           4-5 minutes     3 times per day                         Day 7-8           10-12 minutes 2-3 times per day                         Day 13-14       20-22 minutes 1-2 times per day  -Supine bridges 2x10 -Supine piriformis stretch w/ foot on mat 2x1 minute each LE, education on importance of breathing without hyperventilation and deepening stretch w/ manual effort holding tension vs pulsing -Supine LTR w/ 1 second hold at end range x20 each side, edu on engaging TrA to task without holding breath, pt requires cues to slow pace of movement -Supine sciatic nerve glide x20 each LE w/ 2 second hold for DF/PF, no N/T on RLE, mild N/T around knee reported for LLE -Standing gastroc stretch 3x45 seconds each LE; cues to straighten heel in rear to promote deeper stretch, pt requires standing rest to relax shoulders x2  PATIENT EDUCATION:  Education details: Education on sensitization during sciatic glide vs centralization after task.  Initial HEP and walking program parameters.  interpretation and goal set. Person educated: Patient Education method: Explanation Education comprehension: verbalized understanding and needs further education  HOME EXERCISE PROGRAM: You Can Walk For A Certain Length Of Time Each Day                          Walk 4 minutes 2 times per day.             Increase 1  minutes every 7 days              Work up to 20  minutes (1 times per day).               Example:                         Day 1-2           4-5 minutes     3 times per day                         Day 7-8  10-12 minutes 2-3 times per day                         Day 13-14       20-22 minutes 1-2 times per day  Access Code: [email or web address] URL: [email or web address] Date: 03/04/2023 Prepared by: Camille Bal  Exercises - Supine Bridge  - 1 x daily - 5 x weekly - 3 sets - 10 reps - Supine Piriformis Stretch with Foot on Ground  - 1 x daily - 5 x weekly - 1 sets - 3 reps - 45 seconds hold - Gastroc Stretch on Wall  - 1 x daily - 5 x weekly - 1 sets - 2-3 reps - 30 seconds hold - Supine Lower Trunk Rotation  - 1 x daily - 5 x weekly - 2 sets - 20 reps - 1 second hold - Supine Sciatic Nerve Glide  - 1 x daily - 5 x weekly - 1 sets - 20 reps - 2 seconds hold  ASSESSMENT:  CLINICAL IMPRESSION: Established a walking program using tolerance as a guideline to parameters.  Initiated HEP focused on pain management by engaging core musculature to support the low back as well as stretching to support musculoskeletal alignment.  Pt has tendency to inappropriately pace activity despite cuing, but does correct form and maintain well.  Will continue per POC.  OBJECTIVE IMPAIRMENTS: decreased activity tolerance, decreased mobility, difficulty walking, decreased ROM, decreased strength, hypomobility, improper body mechanics, postural dysfunction, and pain.   ACTIVITY LIMITATIONS: carrying, lifting, bending, and squatting  PARTICIPATION LIMITATIONS: community activity  PERSONAL FACTORS: Age, Fitness, Past/current experiences, and 1 comorbidity: cervical spondylosis  are also affecting patient's functional outcome.   REHAB POTENTIAL: Good  CLINICAL DECISION MAKING: Stable/uncomplicated  EVALUATION COMPLEXITY: Low   GOALS: Goals reviewed with patient? Yes  SHORT TERM GOALS: Target date: 03/21/2023  Pt will be  independent with strengthening HEP to improve functional mobility and overall pain. Baseline:  To be established. Goal status: INITIAL  2.  Walking program to be established with compliance >/=3 days per week to improve aerobic tolerance and promote improved joint mobility. Baseline: To be established. Goal status: INITIAL  LONG TERM GOALS: Target date: 04/11/2023  Patient will improve Modified Oswestry score to </=5/50 to indicate decreased perceived disability related to back pain. Baseline: 9/50 Goal status: INITIAL  2.  Pt will ambulate >/=1000 feet on to demonstrate improved functional endurance for home and community participation. Baseline: 785' no AD SBA (6/11) Goal status: INITIAL  PLAN:  PT FREQUENCY: 1x/week  PT DURATION: 6 weeks  PLANNED INTERVENTIONS: Therapeutic exercises, Therapeutic activity, Neuromuscular re-education, Balance training, Gait training, Patient/Family education, Self Care, Joint mobilization, Stair training, DME instructions, Electrical stimulation, Spinal mobilization, Cryotherapy, Moist heat, Manual therapy, and Re-evaluation.  PLAN FOR NEXT SESSION: Modify HEP prn for pain management, LE/core strength, and stretching. Deadbugs, birddogs, tall kneel, hip abductor strength, lifting, postural support   Sadie Haber, PT, DPT 03/04/2023, 9:27 AM

## 2023-03-11 ENCOUNTER — Encounter: Payer: Self-pay | Admitting: Physical Therapy

## 2023-03-11 ENCOUNTER — Ambulatory Visit: Payer: Medicare HMO | Admitting: Physical Therapy

## 2023-03-11 DIAGNOSIS — M79605 Pain in left leg: Secondary | ICD-10-CM | POA: Diagnosis not present

## 2023-03-11 DIAGNOSIS — M4126 Other idiopathic scoliosis, lumbar region: Secondary | ICD-10-CM

## 2023-03-11 DIAGNOSIS — M5459 Other low back pain: Secondary | ICD-10-CM

## 2023-03-11 DIAGNOSIS — M48062 Spinal stenosis, lumbar region with neurogenic claudication: Secondary | ICD-10-CM | POA: Diagnosis not present

## 2023-03-11 NOTE — Therapy (Signed)
OUTPATIENT PHYSICAL THERAPY THORACOLUMBAR TREATMENT   Patient Name: Jesse [name] MRN: [id number] DOB:Aug 01, 1941, 82 y.o., male Today's Date: 03/11/2023  END OF SESSION:  PT End of Session - 03/11/23 1320     Visit Number 3    Number of Visits 7   6 + eval   Date for PT Re-Evaluation 04/18/23   pushed out due to limited patient availability   Authorization Type [employer] MEDICARE   pt had recent switch from [facility] MEDICARE   Progress Note Due on Visit 10    PT Start Time 1318    PT Stop Time 1353    PT Time Calculation (min) 35 min    Equipment Utilized During Treatment Gait belt    Activity Tolerance Patient tolerated treatment well    Behavior During Therapy WFL for tasks assessed/performed             Past Medical History:  Diagnosis Date   Depression    Hypercholesteremia    Hypertension    HZ (herpes zoster) 09/02/2011   Neuritis    left foot   Polycythemia rubra vera (HCC) 09/02/2011   Past Surgical History:  Procedure Laterality Date   GAS INSERTION  06/04/2012   Procedure: INSERTION OF GAS;  Surgeon: Sherrie George, MD;  Location: [facility] OR;  Service: Ophthalmology;  Laterality: Left;  C3F8   SCLERAL BUCKLE  06/04/2012   Procedure: SCLERAL BUCKLE;  Surgeon: Sherrie George, MD;  Location: [facility] OR;  Service: Ophthalmology;  Laterality: Left;  Headscope laser   Patient Active Problem List   Diagnosis Date Noted   UPJ obstruction, acquired 03/02/2021   Diverticulosis 03/02/2021   Cervical spondylosis 03/02/2021   Unspecified protein-calorie malnutrition (HCC) 03/02/2021   Transaminitis 03/02/2021   Hyponatremia 02/22/2021   Essential hypertension 02/22/2021   Hyperlipidemia 02/22/2021   Fall at home, subsequent encounter 02/22/2021   Need for prophylactic vaccination and inoculation against influenza 06/17/2016   Polycythemia rubra vera (HCC) 09/02/2011   HZ (herpes zoster) 09/02/2011    PCP: Georgann Housekeeper, MD  REFERRING PROVIDER: Erick Colace,  MD  REFERRING DIAG: [phone] (ICD-10-CM) - Spinal stenosis of lumbar region with neurogenic claudication  Rationale for Evaluation and Treatment: Rehabilitation  THERAPY DIAG:  Pain in left leg  Other idiopathic scoliosis, lumbar region  Other low back pain  ONSET DATE: 2 years ago  SUBJECTIVE:                                                                                                                                                                                           SUBJECTIVE STATEMENT: Pt denies falls or acute  changes.  He has some pain in the outside aspect of his calf.  PERTINENT HISTORY:  HTN, HLD, cervical spondylosis  PAIN:  Are you having pain? Yes: NPRS scale: 2-3/10 Pain location: left lower leg Pain description: sore Aggravating factors: lifting heavy (>20 lbs) Relieving factors: relaxing  PRECAUTIONS: Fall  WEIGHT BEARING RESTRICTIONS: No  FALLS:  Has patient fallen in last 6 months? No  LIVING ENVIRONMENT: Lives with: lives with their family and disabled sister lives with him; caregiver present 8 hrs M-F, younger sister provides care on weekends. Lives in: House/apartment Stairs: Yes: Internal: 14-16 steps; on left going up and External: 5 steps; on right going up Has following equipment at home: Single point cane, Walker - 2 wheeled, shower chair, bed side commode, and Grab bars  OCCUPATION: Retired  PLOF: Independent  PATIENT GOALS: "To get better"  NEXT MD VISIT: Erick Colace, MD 04/01/2023 (sees Dr. Christell Constant in orthopedics on 04/02/2023)  OBJECTIVE:   DIAGNOSTIC FINDINGS:  Upcoming Lumbar MRI 03/28/2023  Lumbar x-ray 02/19/2023: XR of the lumbar spine from 02/19/2023 was independently reviewed and  interpreted, showing PI-LL mismatch.  No fracture or dislocation seen.   Disc height loss at T12/L1, L1/L2, L2/L3, L5/S1.  There is anterior  osteophyte formation seen at those levels as well.  Lumbar scoliosis with  apex to the left at  L2/3 disc space.   PATIENT SURVEYS:  Modified Oswestry 9/50 = mild disability   SCREENING FOR RED FLAGS: Bowel or bladder incontinence: No Spinal tumors: No Cauda equina syndrome: No Compression fracture: No Abdominal aneurysm: No  COGNITION: Overall cognitive status: Within functional limits for tasks assessed     SENSATION: Light touch: WFL  POSTURE: rounded shoulders, forward head, increased thoracic kyphosis, and flexed trunk   PALPATION: Not TTP over entire lumbar region and bilateral lumbar flank areas  LUMBAR ROM:   AROM eval  Flexion   Extension Limited ~20-25%  Right lateral flexion WFL  Left lateral flexion WFL; mild pain in left leg  Right rotation WFL  Left rotation WFL   (Blank rows = not tested)  LOWER EXTREMITY ROM:     Active  Right eval Left eval  Hip flexion Grossly WFL  Hip extension   Hip abduction   Hip adduction   Hip internal rotation   Hip external rotation   Knee flexion   Knee extension   Ankle dorsiflexion   Ankle plantarflexion   Ankle inversion   Ankle eversion    (Blank rows = not tested)  LOWER EXTREMITY MMT:    MMT Right eval Left eval  Hip flexion 4+/5 4/5  Hip extension    Hip abduction 4/5 4/5  Hip adduction 3+/5 3+/5  Hip internal rotation    Hip external rotation    Knee flexion 4/5 4/5  Knee extension 5/5 5/5  Ankle dorsiflexion 4+/5 4-/5  Ankle plantarflexion    Ankle inversion    Ankle eversion     (Blank rows = not tested)  LUMBAR SPECIAL TESTS:  Straight leg raise test: Negative and Slump test: Negative-calf tightness noted, no N/T  FUNCTIONAL TESTS:  6 minute walk test: To be assessed.  GAIT: Distance walked: Various clinic distances Assistive device utilized: None Level of assistance: Complete Independence Comments: Kyphotic posture maintained during ambulation.  TODAY'S TREATMENT:  DATE: 03/11/2023 -Hook-lying lower trunk rotations 2x20 -SLR 2x12 each LE -Supine sciatic nerve glide review (pt has not been doing this at home) LLE only x20 -Seated calf raises 2x20, pt requires moderate cues to slow pace of movement for improved strength benefit -Seated toe raises x20 -Seated hamstring stretch 2x45 seconds each LE, pt adding manual pressure to extend knee on second round -Seated lumbar rollouts x10 in 3 directions using green physioball -Seated hamstring curls 2x10 each side PT ambulates pt to restroom following seated water break following activity per pt request.  PATIENT EDUCATION:  Education details: Continue HEP and walking program. Person educated: Patient Education method: Explanation Education comprehension: verbalized understanding and needs further education  HOME EXERCISE PROGRAM: You Can Walk For A Certain Length Of Time Each Day                          Walk 4 minutes 2 times per day.             Increase 1  minutes every 7 days              Work up to 20 minutes (1 times per day).               Example:                         Day 1-2           4-5 minutes     3 times per day                         Day 7-8           10-12 minutes 2-3 times per day                         Day 13-14       20-22 minutes 1-2 times per day  Access Code: [email or web address] URL: [email or web address] Date: 03/04/2023 Prepared by: Camille Bal  Exercises - Supine Bridge  - 1 x daily - 5 x weekly - 3 sets - 10 reps - Supine Piriformis Stretch with Foot on Ground  - 1 x daily - 5 x weekly - 1 sets - 3 reps - 45 seconds hold - Gastroc Stretch on Wall  - 1 x daily - 5 x weekly - 1 sets - 2-3 reps - 30 seconds hold - Supine Lower Trunk Rotation  - 1 x daily - 5 x weekly - 2 sets - 20 reps - 1 second hold - Supine Sciatic Nerve Glide  - 1 x daily - 5 x weekly - 1 sets - 20 reps - 2 seconds hold  ASSESSMENT:  CLINICAL IMPRESSION: Continued  therapeutic exercise focused on engaging core and balancing low back and LE musculature to address tension possibly contributing to pt discomfort.  He has not been compliant to walking program or sciatic nerve glides on HEP, but upon review today he remains motivated to try these.  He continues to benefit from skilled PT interventions to improve mobility and for pain management.  OBJECTIVE IMPAIRMENTS: decreased activity tolerance, decreased mobility, difficulty walking, decreased ROM, decreased strength, hypomobility, improper body mechanics, postural dysfunction, and pain.   ACTIVITY LIMITATIONS: carrying, lifting, bending, and squatting  PARTICIPATION LIMITATIONS: community activity  PERSONAL FACTORS: Age, Fitness, Past/current experiences, and 1 comorbidity: cervical spondylosis  are also  affecting patient's functional outcome.   REHAB POTENTIAL: Good  CLINICAL DECISION MAKING: Stable/uncomplicated  EVALUATION COMPLEXITY: Low   GOALS: Goals reviewed with patient? Yes  SHORT TERM GOALS: Target date: 03/21/2023  Pt will be independent with strengthening HEP to improve functional mobility and overall pain. Baseline:  To be established. Goal status: INITIAL  2.  Walking program to be established with compliance >/=3 days per week to improve aerobic tolerance and promote improved joint mobility. Baseline: To be established. Goal status: INITIAL  LONG TERM GOALS: Target date: 04/11/2023  Patient will improve Modified Oswestry score to </=5/50 to indicate decreased perceived disability related to back pain. Baseline: 9/50 Goal status: INITIAL  2.  Pt will ambulate >/=1000 feet on to demonstrate improved functional endurance for home and community participation. Baseline: 785' no AD SBA (6/11) Goal status: INITIAL  PLAN:  PT FREQUENCY: 1x/week  PT DURATION: 6 weeks  PLANNED INTERVENTIONS: Therapeutic exercises, Therapeutic activity, Neuromuscular re-education, Balance  training, Gait training, Patient/Family education, Self Care, Joint mobilization, Stair training, DME instructions, Electrical stimulation, Spinal mobilization, Cryotherapy, Moist heat, Manual therapy, and Re-evaluation.  PLAN FOR NEXT SESSION: Modify HEP prn for pain management, LE/core strength, and stretching. Deadbugs, birddogs, tall kneel, hip abductor strength, lifting, postural support, walking tolerance, STS   Sadie Haber, PT, DPT 03/11/2023, 1:56 PM

## 2023-03-18 ENCOUNTER — Ambulatory Visit: Payer: Medicare HMO | Admitting: Physical Therapy

## 2023-03-18 ENCOUNTER — Encounter: Payer: Self-pay | Admitting: Physical Therapy

## 2023-03-18 DIAGNOSIS — M4126 Other idiopathic scoliosis, lumbar region: Secondary | ICD-10-CM | POA: Diagnosis not present

## 2023-03-18 DIAGNOSIS — M5459 Other low back pain: Secondary | ICD-10-CM | POA: Diagnosis not present

## 2023-03-18 DIAGNOSIS — M79605 Pain in left leg: Secondary | ICD-10-CM | POA: Diagnosis not present

## 2023-03-18 DIAGNOSIS — M48062 Spinal stenosis, lumbar region with neurogenic claudication: Secondary | ICD-10-CM | POA: Diagnosis not present

## 2023-03-18 NOTE — Therapy (Signed)
OUTPATIENT PHYSICAL THERAPY THORACOLUMBAR TREATMENT   Patient Name: Jesse [name] MRN: [id number] DOB:[birth year], 82 y.o., male Today's Date: 03/18/2023  END OF SESSION:  PT End of Session - 03/18/23 1020     Visit Number 4    Number of Visits 7   6 + eval   Date for PT Re-Evaluation 04/18/23   pushed out due to limited patient availability   Authorization Type [employer] MEDICARE   pt had recent switch from [facility] MEDICARE   Progress Note Due on Visit 10    PT Start Time 1015    PT Stop Time 1056    PT Time Calculation (min) 41 min    Equipment Utilized During Treatment Gait belt    Activity Tolerance Patient tolerated treatment well    Behavior During Therapy WFL for tasks assessed/performed             Past Medical History:  Diagnosis Date   Depression    Hypercholesteremia    Hypertension    HZ (herpes zoster) 09/02/2011   Neuritis    left foot   Polycythemia rubra vera (HCC) 09/02/2011   Past Surgical History:  Procedure Laterality Date   GAS INSERTION  06/04/2012   Procedure: INSERTION OF GAS;  Surgeon: Sherrie George, MD;  Location: [facility] OR;  Service: Ophthalmology;  Laterality: Left;  [id number]   SCLERAL BUCKLE  06/04/2012   Procedure: SCLERAL BUCKLE;  Surgeon: Sherrie George, MD;  Location: [facility] OR;  Service: Ophthalmology;  Laterality: Left;  Headscope laser   Patient Active Problem List   Diagnosis Date Noted   UPJ obstruction, acquired 03/02/2021   Diverticulosis 03/02/2021   Cervical spondylosis 03/02/2021   Unspecified protein-calorie malnutrition (HCC) 03/02/2021   Transaminitis 03/02/2021   Hyponatremia 02/22/2021   Essential hypertension 02/22/2021   Hyperlipidemia 02/22/2021   Fall at home, subsequent encounter 02/22/2021   Need for prophylactic vaccination and inoculation against influenza 06/17/2016   Polycythemia rubra vera (HCC) 09/02/2011   HZ (herpes zoster) 09/02/2011    PCP: Georgann Housekeeper, MD  REFERRING PROVIDER: Erick Colace,  MD  REFERRING DIAG: [phone] (ICD-10-CM) - Spinal stenosis of lumbar region with neurogenic claudication  Rationale for Evaluation and Treatment: Rehabilitation  THERAPY DIAG:  Pain in left leg  Other idiopathic scoliosis, lumbar region  Other low back pain  ONSET DATE: 2 years ago  SUBJECTIVE:                                                                                                                                                                                           SUBJECTIVE STATEMENT: Pt denies falls or acute  changes.  He continues to have pain in his lower left leg.  Patient articulates loosely the exercises he has been working on at home with none of them being on his HEP.    PERTINENT HISTORY:  HTN, HLD, cervical spondylosis  PAIN:  Are you having pain? Yes: NPRS scale: 3-4/10 Pain location: left lower leg Pain description: sore Aggravating factors: lifting heavy (>20 lbs) Relieving factors: relaxing  PRECAUTIONS: Fall  WEIGHT BEARING RESTRICTIONS: No  FALLS:  Has patient fallen in last 6 months? No  LIVING ENVIRONMENT: Lives with: lives with their family and disabled sister lives with him; caregiver present 8 hrs M-F, younger sister provides care on weekends. Lives in: House/apartment Stairs: Yes: Internal: 14-16 steps; on left going up and External: 5 steps; on right going up Has following equipment at home: Single point cane, Walker - 2 wheeled, shower chair, bed side commode, and Grab bars  OCCUPATION: Retired  PLOF: Independent  PATIENT GOALS: "To get better"  NEXT MD VISIT: Erick Colace, MD 04/01/2023 (sees Dr. Christell Constant in orthopedics on 04/02/2023)  OBJECTIVE:   DIAGNOSTIC FINDINGS:  Upcoming Lumbar MRI 03/28/2023  Lumbar x-ray 02/19/2023: XR of the lumbar spine from 02/19/2023 was independently reviewed and  interpreted, showing PI-LL mismatch.  No fracture or dislocation seen.   Disc height loss at T12/L1, L1/L2, L2/L3, L5/S1.  There is  anterior  osteophyte formation seen at those levels as well.  Lumbar scoliosis with  apex to the left at L2/3 disc space.   PATIENT SURVEYS:  Modified Oswestry 9/50 = mild disability   SCREENING FOR RED FLAGS: Bowel or bladder incontinence: No Spinal tumors: No Cauda equina syndrome: No Compression fracture: No Abdominal aneurysm: No  COGNITION: Overall cognitive status: Within functional limits for tasks assessed     SENSATION: Light touch: WFL  POSTURE: rounded shoulders, forward head, increased thoracic kyphosis, and flexed trunk   PALPATION: Not TTP over entire lumbar region and bilateral lumbar flank areas  LUMBAR ROM:   AROM eval  Flexion   Extension Limited ~20-25%  Right lateral flexion WFL  Left lateral flexion WFL; mild pain in left leg  Right rotation WFL  Left rotation WFL   (Blank rows = not tested)  LOWER EXTREMITY ROM:     Active  Right eval Left eval  Hip flexion Grossly WFL  Hip extension   Hip abduction   Hip adduction   Hip internal rotation   Hip external rotation   Knee flexion   Knee extension   Ankle dorsiflexion   Ankle plantarflexion   Ankle inversion   Ankle eversion    (Blank rows = not tested)  LOWER EXTREMITY MMT:    MMT Right eval Left eval  Hip flexion 4+/5 4/5  Hip extension    Hip abduction 4/5 4/5  Hip adduction 3+/5 3+/5  Hip internal rotation    Hip external rotation    Knee flexion 4/5 4/5  Knee extension 5/5 5/5  Ankle dorsiflexion 4+/5 4-/5  Ankle plantarflexion    Ankle inversion    Ankle eversion     (Blank rows = not tested)  LUMBAR SPECIAL TESTS:  Straight leg raise test: Negative and Slump test: Negative-calf tightness noted, no N/T  FUNCTIONAL TESTS:  6 minute walk test: To be assessed.  GAIT: Distance walked: Various clinic distances Assistive device utilized: None Level of assistance: Complete Independence Comments: Kyphotic posture maintained during ambulation.  TODAY'S TREATMENT:  DATE: 03/18/2023 -Verbally reviewed HEP w/o pt desiring re-print.  He requests walking program re-print, provided. -Deadbugs x20 w/ pt having significant difficulty coordinating fluid movement, PT providing maximum multimodal cuing -Side-lying hip abduction x12 each LE w/ pt requiring increased facilitation and positioning by therapist and maximum cuing to properly engage abductor correctly bilaterally -Pt transitions to quadruped SBA, using facilitation at anterior shoulder and posterolateral hip pt performs modified birddogs LE only x5 each > UE only x5 each > alternating UE/LE x8 each side w/ moderate trunk rotation despite cues and facilitation, pt also requires tapping UE to mat for stability suggesting poor trunk engagement -Pt transitions to and from knees on floor mat at CGA level > on mat performs heel sitting to tall kneel x12 > lateral knee walking w/ BUE support on mat table x3 left and x3 right; returned to standing w/ reports of moderate stiffness in bilateral knees that resolves with position change and seated rest x 2 minutes -Standing hip abduction x15 each LE w/ moderate cuing to prevent quad compensation and promote upright posture to correctly engage hip abductors -Attempted lifting 5lb kettlebell to 8" step w/ pt maintaining severely rounded upper body posture, time spent educating and facilitating periscapular engagement to maintain postural correction w/ pt returning to poor posture on second rep, may benefit from part-whole using wall for tactile feedback in future session -STS w/ 5lb kettlebell, emphasis on eccentric control  PATIENT EDUCATION:  Education details: Continue HEP and walking program-re-printed and verbally reviewed to improve compliance 6/25. Person educated: Patient Education method: Explanation Education comprehension: verbalized understanding  and needs further education  HOME EXERCISE PROGRAM: You Can Walk For A Certain Length Of Time Each Day                          Walk 4 minutes 2 times per day.             Increase 1  minutes every 7 days              Work up to 20 minutes (1 times per day).               Example:                         Day 1-2           4-5 minutes     3 times per day                         Day 7-8           10-12 minutes 2-3 times per day                         Day 13-14       20-22 minutes 1-2 times per day  Access Code: [email or web address] URL: [email or web address] Date: 03/04/2023 Prepared by: Camille Bal  Exercises - Supine Bridge  - 1 x daily - 5 x weekly - 3 sets - 10 reps - Supine Piriformis Stretch with Foot on Ground  - 1 x daily - 5 x weekly - 1 sets - 3 reps - 45 seconds hold - Gastroc Stretch on Wall  - 1 x daily - 5 x weekly - 1 sets - 2-3 reps - 30 seconds hold - Supine Lower Trunk Rotation  - 1 x daily -  5 x weekly - 2 sets - 20 reps - 1 second hold - Supine Sciatic Nerve Glide  - 1 x daily - 5 x weekly - 1 sets - 20 reps - 2 seconds hold  ASSESSMENT:  CLINICAL IMPRESSION: Emphasis of skilled session today on core engagement and coordination of trunk and extremity musculature.  Patient very challenged by coordination, decreased core engagement, and poor posture during lifting task.  He continues to benefit from skilled PT to progress towards LTGs and address deficits noted today and in ongoing PT POC.  OBJECTIVE IMPAIRMENTS: decreased activity tolerance, decreased mobility, difficulty walking, decreased ROM, decreased strength, hypomobility, improper body mechanics, postural dysfunction, and pain.   ACTIVITY LIMITATIONS: carrying, lifting, bending, and squatting  PARTICIPATION LIMITATIONS: community activity  PERSONAL FACTORS: Age, Fitness, Past/current experiences, and 1 comorbidity: cervical spondylosis  are also affecting patient's functional outcome.   REHAB  POTENTIAL: Good  CLINICAL DECISION MAKING: Stable/uncomplicated  EVALUATION COMPLEXITY: Low   GOALS: Goals reviewed with patient? Yes  SHORT TERM GOALS: Target date: 03/21/2023  Pt will be independent with strengthening HEP to improve functional mobility and overall pain. Baseline:  To be established. Goal status: INITIAL  2.  Walking program to be established with compliance >/=3 days per week to improve aerobic tolerance and promote improved joint mobility. Baseline: To be established. Goal status: INITIAL  LONG TERM GOALS: Target date: 04/11/2023  Patient will improve Modified Oswestry score to </=5/50 to indicate decreased perceived disability related to back pain. Baseline: 9/50 Goal status: INITIAL  2.  Pt will ambulate >/=1000 feet on to demonstrate improved functional endurance for home and community participation. Baseline: 785' no AD SBA (6/11) Goal status: INITIAL  PLAN:  PT FREQUENCY: 1x/week  PT DURATION: 6 weeks  PLANNED INTERVENTIONS: Therapeutic exercises, Therapeutic activity, Neuromuscular re-education, Balance training, Gait training, Patient/Family education, Self Care, Joint mobilization, Stair training, DME instructions, Electrical stimulation, Spinal mobilization, Cryotherapy, Moist heat, Manual therapy, and Re-evaluation.  PLAN FOR NEXT SESSION: Modify HEP prn for pain management, LE/core strength, and stretching. Deadbugs, birddogs, tall kneel, hip abductor strength, lifting-try part-whole using wall feedback for hip hinge w/o weight first, postural support, walking tolerance   Sadie Haber, PT, DPT 03/18/2023, 10:56 AM

## 2023-03-25 ENCOUNTER — Ambulatory Visit: Payer: Medicare HMO | Admitting: Physical Therapy

## 2023-03-28 ENCOUNTER — Ambulatory Visit
Admission: RE | Admit: 2023-03-28 | Discharge: 2023-03-28 | Disposition: A | Discharge status: Home / Self Care | Payer: Medicare HMO | Source: Ambulatory Visit | Attending: Orthopedic Surgery

## 2023-03-28 DIAGNOSIS — M419 Scoliosis, unspecified: Secondary | ICD-10-CM | POA: Diagnosis not present

## 2023-03-28 DIAGNOSIS — M5416 Radiculopathy, lumbar region: Secondary | ICD-10-CM

## 2023-03-28 DIAGNOSIS — M5136 Other intervertebral disc degeneration, lumbar region: Secondary | ICD-10-CM | POA: Diagnosis not present

## 2023-03-28 DIAGNOSIS — M48061 Spinal stenosis, lumbar region without neurogenic claudication: Secondary | ICD-10-CM | POA: Diagnosis not present

## 2023-04-01 ENCOUNTER — Encounter: Payer: Medicare HMO | Admitting: Physical Medicine & Rehabilitation

## 2023-04-01 ENCOUNTER — Ambulatory Visit: Payer: Medicare HMO | Attending: Physical Medicine & Rehabilitation | Admitting: Physical Therapy

## 2023-04-01 DIAGNOSIS — M5459 Other low back pain: Secondary | ICD-10-CM | POA: Insufficient documentation

## 2023-04-01 DIAGNOSIS — M4126 Other idiopathic scoliosis, lumbar region: Secondary | ICD-10-CM | POA: Insufficient documentation

## 2023-04-01 DIAGNOSIS — M79605 Pain in left leg: Secondary | ICD-10-CM | POA: Insufficient documentation

## 2023-04-02 ENCOUNTER — Encounter: Payer: Self-pay | Admitting: Orthopedic Surgery

## 2023-04-02 ENCOUNTER — Ambulatory Visit: Payer: Medicare HMO | Admitting: Orthopedic Surgery

## 2023-04-02 DIAGNOSIS — M5416 Radiculopathy, lumbar region: Secondary | ICD-10-CM

## 2023-04-02 NOTE — Progress Notes (Signed)
Orthopedic Spine Surgery Office Note   Assessment: Patient is a 82 y.o. male with with chronic low back pain and sagittal imbalance (T1 pelvis angle 24, SVA of 7) and radicular left leg pain     Plan: -Patient has tried activity modification, naproxen, tylenol, injections, lyrica  -Recommended diagnostic injection.  I talked about foraminotomy as a treatment option for him.  Explained he has multiple foramen with stenosis, but his pain seems to be an L4 or L5 distribution and the most stenotic foramen on the left are at L4 and L5.  I told him if he gets good relief, even if it is temporary, then we could do L4/5 and L5/S1 foraminotomies.  I told him that this would not help with his back pain.  The surgery would be done just for the leg pain. -Patient should return to office in 4 weeks, x-rays at next visit: None     Patient expressed understanding of the plan and all questions were answered to the patient's satisfaction.    ___________________________________________________________________________   History: Patient is a 82 y.o. male who has been previously seen in the office for symptoms of low back pain and left leg pain.  He feels the pain is the same as the last time I saw him.  He feels low back pain in the area of the lower lumbar region.  He also has left leg pain.  He feels it on the anterior and lateral aspect of the leg and goes into the dorsal foot.  No right leg symptoms.  Denies paresthesias and numbness.   Previous treatments: activity modification, naproxen, tylenol, injections, lyrica    Physical Exam:   General: no acute distress, appears stated age Neurologic: alert, answering questions appropriately, following commands Respiratory: unlabored breathing on room air, symmetric chest rise Psychiatric: appropriate affect, normal cadence to speech     MSK (spine):   -Strength exam                                                   Left                  Right EHL                               4/5                  4/5 TA                                 5/5                  5/5 GSC                             5/5                  5/5 Knee extension            5/5                  5/5 Hip flexion                    5/5  5/5   -Sensory exam                           Sensation intact to light touch in L3-S1 nerve distributions of bilateral lower extremities   -Straight leg raise: Negative bilaterally -Femoral nerve stretch test: Negative bilaterally -Clonus: no beats bilaterally   Imaging: XR of the lumbar spine from 02/19/2023 was previously independently reviewed and interpreted, showing PI-LL mismatch.  No fracture or dislocation seen.  Disc height loss at T12/L1, L1/L2, L2/L3, L5/S1.  There is anterior osteophyte formation seen at those levels as well.  Lumbar scoliosis with apex to the left at L2/3 disc space.  MRI of the lumbar spine from 03/28/2023 was independently reviewed and interpreted, showing DDD throughout the lumbar spine.  Lateral recess stenosis at L2/3.  Lateral recess stenosis at L4/5.  Central disc herniation and right lateral recess stenosis at L5/S1.  No significant central stenosis.  Foraminal stenosis at multiple levels but worst at L3/4 on the right, L5/S1 bilaterally, and left L4/5.     Patient name: Jesse [name] Patient MRN: [id number] Date of visit: 04/02/23

## 2023-04-04 ENCOUNTER — Inpatient Hospital Stay: Payer: Medicare HMO | Attending: Family

## 2023-04-04 ENCOUNTER — Inpatient Hospital Stay: Payer: Medicare HMO

## 2023-04-04 ENCOUNTER — Inpatient Hospital Stay: Payer: Medicare HMO | Admitting: Family

## 2023-04-04 ENCOUNTER — Encounter: Payer: Self-pay | Admitting: Family

## 2023-04-04 VITALS — BP 112/72 | HR 78 | Temp 97.6°F | Resp 17 | Wt 150.0 lb

## 2023-04-04 DIAGNOSIS — D45 Polycythemia vera: Secondary | ICD-10-CM | POA: Diagnosis not present

## 2023-04-04 DIAGNOSIS — D500 Iron deficiency anemia secondary to blood loss (chronic): Secondary | ICD-10-CM

## 2023-04-04 DIAGNOSIS — D751 Secondary polycythemia: Secondary | ICD-10-CM | POA: Diagnosis not present

## 2023-04-04 LAB — FERRITIN: Ferritin: 16 ng/mL — ABNORMAL LOW (ref 24–336)

## 2023-04-04 LAB — CMP (CANCER CENTER ONLY)
ALT: 13 U/L (ref 0–44)
AST: 14 U/L — ABNORMAL LOW (ref 15–41)
Albumin: 4.1 g/dL (ref 3.5–5.0)
Alkaline Phosphatase: 51 U/L (ref 38–126)
Anion gap: 8 (ref 5–15)
BUN: 18 mg/dL (ref 8–23)
CO2: 26 mmol/L (ref 22–32)
Calcium: 10.1 mg/dL (ref 8.9–10.3)
Chloride: 98 mmol/L (ref 98–111)
Creatinine: 0.75 mg/dL (ref 0.61–1.24)
GFR, Estimated: >60 mL/min (ref >60)
Glucose, Bld: 106 mg/dL — ABNORMAL HIGH (ref 70–99)
Potassium: 4.2 mmol/L (ref 3.5–5.1)
Sodium: 132 mmol/L — ABNORMAL LOW (ref 135–145)
Total Bilirubin: 1.4 mg/dL — ABNORMAL HIGH (ref 0.3–1.2)
Total Protein: 6.6 g/dL (ref 6.5–8.1)

## 2023-04-04 LAB — CBC WITH DIFFERENTIAL (CANCER CENTER ONLY)
Abs Immature Granulocytes: 0.04 10*3/uL (ref 0.00–0.07)
Basophils Absolute: 0 10*3/uL (ref 0.0–0.1)
Basophils Relative: 0 %
Eosinophils Absolute: 0.1 10*3/uL (ref 0.0–0.5)
Eosinophils Relative: 1 %
HCT: 46 % (ref 39.0–52.0)
Hemoglobin: 15 g/dL (ref 13.0–17.0)
Immature Granulocytes: 1 %
Lymphocytes Relative: 23 %
Lymphs Abs: 1.6 10*3/uL (ref 0.7–4.0)
MCH: 29 pg (ref 26.0–34.0)
MCHC: 32.6 g/dL (ref 30.0–36.0)
MCV: 88.8 fL (ref 80.0–100.0)
Monocytes Absolute: 0.9 10*3/uL (ref 0.1–1.0)
Monocytes Relative: 14 %
Neutro Abs: 4.3 10*3/uL (ref 1.7–7.7)
Neutrophils Relative %: 61 %
Platelet Count: 244 10*3/uL (ref 150–400)
RBC: 5.18 MIL/uL (ref 4.22–5.81)
RDW: 14.9 % (ref 11.5–15.5)
WBC Count: 6.9 10*3/uL (ref 4.0–10.5)
nRBC: 0 % (ref 0.0–0.2)

## 2023-04-04 LAB — IRON AND IRON BINDING CAPACITY (CC-WL,HP ONLY)
Iron: 101 ug/dL (ref 45–182)
Saturation Ratios: 25 % (ref 17.9–39.5)
TIBC: 406 ug/dL (ref 250–450)
UIBC: 305 ug/dL (ref 117–376)

## 2023-04-04 NOTE — Progress Notes (Signed)
Hematology and Oncology Follow Up Visit  Jesse [name] [id number] Mar 10, 1941 82 y.o. 04/04/2023   Principle Diagnosis:  Polycythemia - JAK2 negative   Current Therapy:        Phlebotomy to maintain hematocrit below 45% Aspirin 81 mg by mouth daily   Interim History:  [name] is here today for follow-up. He is doing fairly well but notes fatigue.  No blood loss, bruising or petechiae.  No fever, chills, n/v, cough, rash, dizziness, SOB, chest pain, palpitations, abdominal pain or changes in bowel or bladder habits at this time.  Joint and lower back pain secondary to arthritis waxes and wanes.  No numbness or tingling in his extremities at this time.  No falls or syncope reported.  Appetite and hydration are good. Weight is stable at 150 lbs.   ECOG Performance Status: 1 - Symptomatic but completely ambulatory  Medications:  Allergies as of 04/04/2023       Reactions   Hctz [hydrochlorothiazide] Other (See Comments)   Hyponatremia ; Na+ 111, w weakness resulting in mechanical fall   Tramadol Nausea And Vomiting        Medication List        Accurate as of April 04, 2023 10:42 AM. If you have any questions, ask your nurse or doctor.          ALPRAZolam 1 MG tablet Commonly known as: XANAX Take 0.5 mg by mouth at bedtime.   Cholecalciferol 50 MCG (2000 UT) Caps Take 2,000 Units by mouth daily.   fish oil-omega-3 fatty acids 1000 MG capsule Take 1 g by mouth daily.   Fluad Quadrivalent 0.5 ML injection Generic drug: influenza vaccine adjuvanted Inject into the muscle.   lisinopril 20 MG tablet Commonly known as: ZESTRIL Take 1 tablet (20 mg total) by mouth daily.   naproxen 500 MG tablet Commonly known as: NAPROSYN Take 1 tablet (500 mg total) by mouth 2 (two) times daily with a meal.   pregabalin 50 MG capsule Commonly known as: LYRICA Take 1 capsule (50 mg total) by mouth 2 (two) times daily.   simvastatin 20 MG tablet Commonly known as:  ZOCOR Take 1 tablet (20 mg total) by mouth daily at 6 PM.   tamsulosin 0.4 MG Caps capsule Commonly known as: FLOMAX Take 0.4 mg by mouth daily.   vitamin E 1000 UNIT capsule Take 1,000 Units by mouth daily.        Allergies:  Allergies  Allergen Reactions   Hctz [Hydrochlorothiazide] Other (See Comments)    Hyponatremia ; Na+ 111, w weakness resulting in mechanical fall   Tramadol Nausea And Vomiting    Past Medical History, Surgical history, Social history, and Family History were reviewed and updated.  Review of Systems: All other 10 point review of systems is negative.   Physical Exam:  weight is 150 lb (68 kg). His oral temperature is 97.6 F (36.4 C). His blood pressure is 112/72 and his pulse is 78. His respiration is 17 and oxygen saturation is 98%.   Wt Readings from Last 3 Encounters:  04/04/23 150 lb (68 kg)  02/13/23 152 lb (68.9 kg)  01/21/23 153 lb (69.4 kg)    Ocular: Sclerae unicteric, pupils equal, round and reactive to light Ear-nose-throat: Oropharynx clear, dentition fair Lymphatic: No cervical or supraclavicular adenopathy Lungs no rales or rhonchi, good excursion bilaterally Heart regular rate and rhythm, no murmur appreciated Abd soft, nontender, positive bowel sounds MSK no focal spinal tenderness, no joint edema Neuro: non-focal,  well-oriented, appropriate affect Breasts: Deferred   Lab Results  Component Value Date   WBC 6.9 04/04/2023   HGB 15.0 04/04/2023   HCT 46.0 04/04/2023   MCV 88.8 04/04/2023   PLT 244 04/04/2023   Lab Results  Component Value Date   FERRITIN 13 (L) 02/03/2023   IRON 68 02/03/2023   TIBC 433 02/03/2023   UIBC 365 02/03/2023   IRONPCTSAT 16 (L) 02/03/2023   Lab Results  Component Value Date   RETICCTPCT 1.6 12/11/2021   RBC 5.18 04/04/2023   RETICCTABS 87.5 01/02/2011   No results found for: "KPAFRELGTCHN", "LAMBDASER", "KAPLAMBRATIO" No results found for: "IGGSERUM", "IGA", "IGMSERUM" No results  found for: "TOTALPROTELP", "ALBUMINELP", "A1GS", "A2GS", "BETS", "BETA2SER", "GAMS", "MSPIKE", "SPEI"   Chemistry      Component Value Date/Time   NA 132 (L) 04/04/2023 1002   NA 136 08/19/2017 1149   NA 135 (L) 10/21/2016 1151   K 4.2 04/04/2023 1002   K 3.8 08/19/2017 1149   K 3.9 10/21/2016 1151   CL 98 04/04/2023 1002   CL 95 (L) 08/19/2017 1149   CO2 26 04/04/2023 1002   CO2 28 08/19/2017 1149   CO2 25 10/21/2016 1151   BUN 18 04/04/2023 1002   BUN 14 08/19/2017 1149   BUN 13.6 10/21/2016 1151   CREATININE 0.75 04/04/2023 1002   CREATININE 1.0 08/19/2017 1149   CREATININE 0.8 10/21/2016 1151      Component Value Date/Time   CALCIUM 10.1 04/04/2023 1002   CALCIUM 10.1 08/19/2017 1149   CALCIUM 10.3 10/21/2016 1151   ALKPHOS 51 04/04/2023 1002   ALKPHOS 68 08/19/2017 1149   ALKPHOS 68 10/21/2016 1151   AST 14 (L) 04/04/2023 1002   AST 25 10/21/2016 1151   ALT 13 04/04/2023 1002   ALT 32 08/19/2017 1149   ALT 38 10/21/2016 1151   BILITOT 1.4 (H) 04/04/2023 1002   BILITOT 1.22 (H) 10/21/2016 1151       Impression and Plan: Jesse [name] is a very pleasant 82 yo caucasian gentleman with polycythemia, JAK-2 negative.  No phlebotomy today for Hct 46.0%. Patient prefers to hold off on phlebotomy today.  3 months follow-up.   Eileen Stanford, NP 7/12/202410:42 AM

## 2023-04-08 ENCOUNTER — Ambulatory Visit: Payer: Medicare HMO | Admitting: Physical Therapy

## 2023-04-08 ENCOUNTER — Encounter: Payer: Self-pay | Admitting: Physical Therapy

## 2023-04-08 DIAGNOSIS — M4126 Other idiopathic scoliosis, lumbar region: Secondary | ICD-10-CM

## 2023-04-08 DIAGNOSIS — M79605 Pain in left leg: Secondary | ICD-10-CM | POA: Diagnosis not present

## 2023-04-08 DIAGNOSIS — M5459 Other low back pain: Secondary | ICD-10-CM | POA: Diagnosis not present

## 2023-04-08 NOTE — Therapy (Signed)
OUTPATIENT PHYSICAL THERAPY THORACOLUMBAR TREATMENT - DISCHARGE SUMMARY   Patient Name: Jesse [name] MRN: [id number] DOB:October 05, 1940, 82 y.o., male Today's Date: 04/08/2023  PHYSICAL THERAPY DISCHARGE SUMMARY  Visits from Start of Care: 5  Current functional level related to goals / functional outcomes: See clinical impression statement.   Remaining deficits: Kyphotic posture and crouched gait, ongoing LLE pain   Education / Equipment: Continue HEP and walking program.  Trial getting in pool and walking for pain management as desired.  Return with new referral if desiring further management of symptoms or aquatic HEP.  Pt desires discharge vs re-cert today.  Discussed progress towards goals.   Patient agrees to discharge. Patient goals were partially met. Patient is being discharged due to the patient's request.   END OF SESSION:  PT End of Session - 04/08/23 1025     Visit Number 5    Number of Visits 7   6 + eval   Date for PT Re-Evaluation 04/18/23   pushed out due to limited patient availability   Authorization Type [employer] MEDICARE   pt had recent switch from [facility] MEDICARE   Progress Note Due on Visit 10    PT Start Time 1020    PT Stop Time 1100    PT Time Calculation (min) 40 min    Equipment Utilized During Treatment Gait belt    Activity Tolerance Patient tolerated treatment well    Behavior During Therapy WFL for tasks assessed/performed             Past Medical History:  Diagnosis Date   Depression    Hypercholesteremia    Hypertension    HZ (herpes zoster) 09/02/2011   Neuritis    left foot   Polycythemia rubra vera (HCC) 09/02/2011   Past Surgical History:  Procedure Laterality Date   GAS INSERTION  06/04/2012   Procedure: INSERTION OF GAS;  Surgeon: Sherrie George, MD;  Location: [facility] OR;  Service: Ophthalmology;  Laterality: Left;  [id number]   SCLERAL BUCKLE  06/04/2012   Procedure: SCLERAL BUCKLE;  Surgeon: Sherrie George, MD;  Location: [facility] OR;   Service: Ophthalmology;  Laterality: Left;  Headscope laser   Patient Active Problem List   Diagnosis Date Noted   UPJ obstruction, acquired 03/02/2021   Diverticulosis 03/02/2021   Cervical spondylosis 03/02/2021   Unspecified protein-calorie malnutrition (HCC) 03/02/2021   Transaminitis 03/02/2021   Hyponatremia 02/22/2021   Essential hypertension 02/22/2021   Hyperlipidemia 02/22/2021   Fall at home, subsequent encounter 02/22/2021   Need for prophylactic vaccination and inoculation against influenza 06/17/2016   Polycythemia rubra vera (HCC) 09/02/2011   HZ (herpes zoster) 09/02/2011    PCP: Georgann Housekeeper, MD  REFERRING PROVIDER: Erick Colace, MD  REFERRING DIAG: [phone] (ICD-10-CM) - Spinal stenosis of lumbar region with neurogenic claudication  Rationale for Evaluation and Treatment: Rehabilitation  THERAPY DIAG:  Pain in left leg  Other idiopathic scoliosis, lumbar region  Other low back pain  ONSET DATE: 2 years ago  SUBJECTIVE:  SUBJECTIVE STATEMENT: Pt denies falls or acute changes.  He is having a little pain today.  He has been doing some bending, lifting, and twisting in the past week as he has had to get groceries and do other chores.  He is awaiting a scheduling phone call for a lumbar pain injection as discussed when he saw Dr. Christell Constant on 7/10.  PERTINENT HISTORY:  HTN, HLD, cervical spondylosis  PAIN:  Are you having pain? Yes: NPRS scale: 3/10 Pain location: left lower leg, left low back Pain description: sore Aggravating factors: lifting heavy (>20 lbs) Relieving factors: relaxing  PRECAUTIONS: Fall  WEIGHT BEARING RESTRICTIONS: No  FALLS:  Has patient fallen in last 6 months? No  LIVING ENVIRONMENT: Lives with: lives with their family and disabled  sister lives with him; caregiver present 8 hrs M-F, younger sister provides care on weekends. Lives in: House/apartment Stairs: Yes: Internal: 14-16 steps; on left going up and External: 5 steps; on right going up Has following equipment at home: Single point cane, Walker - 2 wheeled, shower chair, bed side commode, and Grab bars  OCCUPATION: Retired  PLOF: Independent  PATIENT GOALS: "To get better"  NEXT MD VISIT: Erick Colace, MD 04/01/2023 (sees Dr. Christell Constant in orthopedics on 04/02/2023)  OBJECTIVE:   DIAGNOSTIC FINDINGS:  Upcoming Lumbar MRI 03/28/2023  Lumbar x-ray 02/19/2023: XR of the lumbar spine from 02/19/2023 was independently reviewed and  interpreted, showing PI-LL mismatch.  No fracture or dislocation seen.   Disc height loss at T12/L1, L1/L2, L2/L3, L5/S1.  There is anterior  osteophyte formation seen at those levels as well.  Lumbar scoliosis with  apex to the left at L2/3 disc space.   PATIENT SURVEYS:  Modified Oswestry 9/50 = mild disability   SCREENING FOR RED FLAGS: Bowel or bladder incontinence: No Spinal tumors: No Cauda equina syndrome: No Compression fracture: No Abdominal aneurysm: No  COGNITION: Overall cognitive status: Within functional limits for tasks assessed     SENSATION: Light touch: WFL  POSTURE: rounded shoulders, forward head, increased thoracic kyphosis, and flexed trunk   PALPATION: Not TTP over entire lumbar region and bilateral lumbar flank areas  LUMBAR ROM:   AROM eval  Flexion   Extension Limited ~20-25%  Right lateral flexion WFL  Left lateral flexion WFL; mild pain in left leg  Right rotation WFL  Left rotation WFL   (Blank rows = not tested)  LOWER EXTREMITY ROM:     Active  Right eval Left eval  Hip flexion Grossly WFL  Hip extension   Hip abduction   Hip adduction   Hip internal rotation   Hip external rotation   Knee flexion   Knee extension   Ankle dorsiflexion   Ankle plantarflexion   Ankle  inversion   Ankle eversion    (Blank rows = not tested)  LOWER EXTREMITY MMT:    MMT Right eval Left eval  Hip flexion 4+/5 4/5  Hip extension    Hip abduction 4/5 4/5  Hip adduction 3+/5 3+/5  Hip internal rotation    Hip external rotation    Knee flexion 4/5 4/5  Knee extension 5/5 5/5  Ankle dorsiflexion 4+/5 4-/5  Ankle plantarflexion    Ankle inversion    Ankle eversion     (Blank rows = not tested)  LUMBAR SPECIAL TESTS:  Straight leg raise test: Negative and Slump test: Negative-calf tightness noted, no N/T  FUNCTIONAL TESTS:  6 minute walk test: To be assessed.  GAIT: Distance walked:  Various clinic distances Assistive device utilized: None Level of assistance: Complete Independence Comments: Kyphotic posture maintained during ambulation.  TODAY'S TREATMENT:                                                                                                                              DATE: 04/08/2023 -Verbally reviewed HEP, pt declined reprint, states they still feel "a little bit" challenging.  He is walking about 10 minutes continuously each day per report. -Modified Oswestry:  14/50  - :  85' continuously no AD independently, some variability in cadence and stride length as distance progressed likely due to fatigue, denies worsened symptoms -Discussed patient trialing pool exercise/water walking on his own as he has access to gyms via his insurance per report.  Discussed using rail and stairs to enter and exit and pt reports feeling comfortable attempting this himself.  Discussed benefits of land and benefits of aquatics in terms of strengthening, balance, and pain management.  Pt may benefit from water exercise for temporary pain management and improved balance, but did acknowledge it will not provide the same strength and balance benefits as land therapy so best used together.  He is safe and able to work on this independently, but was instructed to return with  new referral if he desires further pool instruction or aquatic HEP as he did not on initial evaluation this episode of care.  PATIENT EDUCATION:  Education details: Continue HEP and walking program.  Trial getting in pool and walking for pain management as desired.  Return with new referral if desiring further management of symptoms or aquatic HEP.  Pt desires discharge vs re-cert today.  Discussed progress towards goals. Person educated: Patient Education method: Explanation Education comprehension: verbalized understanding and needs further education  HOME EXERCISE PROGRAM: You Can Walk For A Certain Length Of Time Each Day                          Walk 4 minutes 2 times per day.             Increase 1  minutes every 7 days              Work up to 20 minutes (1 times per day).               Example:                         Day 1-2           4-5 minutes     3 times per day                         Day 7-8           10-12 minutes 2-3 times per day  Day 13-14       20-22 minutes 1-2 times per day  Access Code: [email or web address] URL: [email or web address] Date: 03/04/2023 Prepared by: Camille Bal  Exercises - Supine Bridge  - 1 x daily - 5 x weekly - 3 sets - 10 reps - Supine Piriformis Stretch with Foot on Ground  - 1 x daily - 5 x weekly - 1 sets - 3 reps - 45 seconds hold - Gastroc Stretch on Wall  - 1 x daily - 5 x weekly - 1 sets - 2-3 reps - 30 seconds hold - Supine Lower Trunk Rotation  - 1 x daily - 5 x weekly - 2 sets - 20 reps - 1 second hold - Supine Sciatic Nerve Glide  - 1 x daily - 5 x weekly - 1 sets - 20 reps - 2 seconds hold  ASSESSMENT:  CLINICAL IMPRESSION: Assessed both STG and LTG this session with patient compliant to HEP and walking program and reporting good progress.  His modified oswestry score is contradictory to reports of improved movement and pain management with an increased score from initial 9/50 to 14/50.  He was able  to ambulate 949 feet without AD independently and continuously during assessment indicating improved endurance and stable LLE symptoms.  He desires discharge today as he feels he can work on things at home.  PT did recommend getting in the pool and walking as long as he feels safe since he has insurance access to gym based pools per report.  Did encourage him to obtain new referral and return for aquatic guidance and HEP if desired or feeling unsure or unsafe about this activity.  He is appropriate for and in agreement to discharge to home management at this time.  OBJECTIVE IMPAIRMENTS: decreased activity tolerance, decreased mobility, difficulty walking, decreased ROM, decreased strength, hypomobility, improper body mechanics, postural dysfunction, and pain.   ACTIVITY LIMITATIONS: carrying, lifting, bending, and squatting  PARTICIPATION LIMITATIONS: community activity  PERSONAL FACTORS: Age, Fitness, Past/current experiences, and 1 comorbidity: cervical spondylosis  are also affecting patient's functional outcome.   REHAB POTENTIAL: Good  CLINICAL DECISION MAKING: Stable/uncomplicated  EVALUATION COMPLEXITY: Low   GOALS: Goals reviewed with patient? Yes  SHORT TERM GOALS: Target date: 03/21/2023  Pt will be independent with strengthening HEP to improve functional mobility and overall pain. Baseline:  IND per report (7/16) Goal status: MET  2.  Walking program to be established with compliance >/=3 days per week to improve aerobic tolerance and promote improved joint mobility. Baseline: Pt reports walking 10 minutes each day (7/16) Goal status: MET  LONG TERM GOALS: Target date: 04/11/2023  Patient will improve Modified Oswestry score to </=5/50 to indicate decreased perceived disability related to back pain. Baseline: 9/50; 14/50 (7/16) Goal status: NOT MET  2.  Pt will ambulate >/=1000 feet on to demonstrate improved functional endurance for home and community  participation. Baseline: 785' no AD SBA (6/11) 949' continuously no AD independently (7/16) Goal status: IN PROGRESS  PLAN:  PT FREQUENCY: 1x/week  PT DURATION: 6 weeks  PLANNED INTERVENTIONS: Therapeutic exercises, Therapeutic activity, Neuromuscular re-education, Balance training, Gait training, Patient/Family education, Self Care, Joint mobilization, Stair training, DME instructions, Electrical stimulation, Spinal mobilization, Cryotherapy, Moist heat, Manual therapy, and Re-evaluation.  PLAN FOR NEXT SESSION: N/A  Sadie Haber, PT, DPT 04/08/2023, 11:06 AM

## 2023-04-23 ENCOUNTER — Ambulatory Visit: Payer: Medicare HMO | Admitting: Physical Medicine and Rehabilitation

## 2023-04-23 ENCOUNTER — Other Ambulatory Visit: Payer: Self-pay

## 2023-04-23 VITALS — BP 143/74 | HR 90

## 2023-04-23 DIAGNOSIS — M5416 Radiculopathy, lumbar region: Secondary | ICD-10-CM

## 2023-04-23 MED ORDER — METHYLPREDNISOLONE ACETATE 80 MG/ML IJ SUSP
80.0000 mg | Freq: Once | INTRAMUSCULAR | Status: AC
  Administered 2023-04-23: 80 mg

## 2023-04-23 NOTE — Procedures (Signed)
Lumbosacral Transforaminal Epidural Steroid Injection - Sub-Pedicular Approach with Fluoroscopic Guidance  Patient: Jesse [name]      Date of Birth: May 14, 1941 MRN: [id number] PCP: Georgann Housekeeper, MD      Visit Date: 04/23/2023   Universal Protocol:    Date/Time: 04/23/2023  Consent Given By: the patient  Position: PRONE  Additional Comments: Vital signs were monitored before and after the procedure. Patient was prepped and draped in the usual sterile fashion. The correct patient, procedure, and site was verified.   Injection Procedure Details:   Procedure diagnoses: Lumbar radiculopathy [M54.16]    Meds Administered:  Meds ordered this encounter  Medications   methylPREDNISolone acetate (DEPO-MEDROL) injection 80 mg    Laterality: Left  Location/Site: L4  Needle:5.0 in., 22 ga.  Short bevel or Quincke spinal needle  Needle Placement: Transforaminal  Findings:    -Comments: Excellent flow of contrast along the nerve, nerve root and into the epidural space.  Procedure Details: After squaring off the end-plates to get a true AP view, the C-arm was positioned so that an oblique view of the foramen as noted above was visualized. The target area is just inferior to the "nose of the scotty dog" or sub pedicular. The soft tissues overlying this structure were infiltrated with 2-3 ml. of 1% Lidocaine without Epinephrine.  The spinal needle was inserted toward the target using a "trajectory" view along the fluoroscope beam.  Under AP and lateral visualization, the needle was advanced so it did not puncture dura and was located close the 6 O'Clock position of the pedical in AP tracterory. Biplanar projections were used to confirm position. Aspiration was confirmed to be negative for CSF and/or blood. A 1-2 ml. volume of Isovue-250 was injected and flow of contrast was noted at each level. Radiographs were obtained for documentation purposes.   After attaining the desired flow of  contrast documented above, a 0.5 to 1.0 ml test dose of 0.25% Marcaine was injected into each respective transforaminal space.  The patient was observed for 90 seconds post injection.  After no sensory deficits were reported, and normal lower extremity motor function was noted,   the above injectate was administered so that equal amounts of the injectate were placed at each foramen (level) into the transforaminal epidural space.   Additional Comments:  No complications occurred Dressing: 2 x 2 sterile gauze and Band-Aid    Post-procedure details: Patient was observed during the procedure. Post-procedure instructions were reviewed.  Patient left the clinic in stable condition.

## 2023-04-23 NOTE — Progress Notes (Signed)
Jesse [name] - 82 y.o. male MRN [id number]  Date of birth: [birth year]  Office Visit Note: Visit Date: 04/23/2023 PCP: Georgann Housekeeper, MD Referred by: London Sheer, MD  Subjective: Chief Complaint  Patient presents with   Lower Back - Pain   HPI:  Jesse [name] is a 82 y.o. male who comes in today at the request of Dr. Willia Craze for planned Left L4-5 Lumbar Transforaminal epidural steroid injection with fluoroscopic guidance.  The patient has failed conservative care including home exercise, medications, time and activity modification.  This injection will be diagnostic and hopefully therapeutic.  Please see requesting physician notes for further details and justification.   ROS Otherwise per HPI.  Assessment & Plan: Visit Diagnoses:    ICD-10-CM   1. Lumbar radiculopathy  M54.16 XR C-ARM NO REPORT    Epidural Steroid injection    methylPREDNISolone acetate (DEPO-MEDROL) injection 80 mg      Plan: No additional findings.   Meds & Orders:  Meds ordered this encounter  Medications   methylPREDNISolone acetate (DEPO-MEDROL) injection 80 mg    Orders Placed This Encounter  Procedures   XR C-ARM NO REPORT   Epidural Steroid injection    Follow-up: Return if symptoms worsen or fail to improve.   Procedures: No procedures performed  Lumbosacral Transforaminal Epidural Steroid Injection - Sub-Pedicular Approach with Fluoroscopic Guidance  Patient: Jesse [name]      Date of Birth: 02-Mar-1941 MRN: [id number] PCP: Georgann Housekeeper, MD      Visit Date: 04/23/2023   Universal Protocol:    Date/Time: 04/23/2023  Consent Given By: the patient  Position: PRONE  Additional Comments: Vital signs were monitored before and after the procedure. Patient was prepped and draped in the usual sterile fashion. The correct patient, procedure, and site was verified.   Injection Procedure Details:   Procedure diagnoses: Lumbar radiculopathy [M54.16]    Meds Administered:   Meds ordered this encounter  Medications   methylPREDNISolone acetate (DEPO-MEDROL) injection 80 mg    Laterality: Left  Location/Site: L4  Needle:5.0 in., 22 ga.  Short bevel or Quincke spinal needle  Needle Placement: Transforaminal  Findings:    -Comments: Excellent flow of contrast along the nerve, nerve root and into the epidural space.  Procedure Details: After squaring off the end-plates to get a true AP view, the C-arm was positioned so that an oblique view of the foramen as noted above was visualized. The target area is just inferior to the "nose of the scotty dog" or sub pedicular. The soft tissues overlying this structure were infiltrated with 2-3 ml. of 1% Lidocaine without Epinephrine.  The spinal needle was inserted toward the target using a "trajectory" view along the fluoroscope beam.  Under AP and lateral visualization, the needle was advanced so it did not puncture dura and was located close the 6 O'Clock position of the pedical in AP tracterory. Biplanar projections were used to confirm position. Aspiration was confirmed to be negative for CSF and/or blood. A 1-2 ml. volume of Isovue-250 was injected and flow of contrast was noted at each level. Radiographs were obtained for documentation purposes.   After attaining the desired flow of contrast documented above, a 0.5 to 1.0 ml test dose of 0.25% Marcaine was injected into each respective transforaminal space.  The patient was observed for 90 seconds post injection.  After no sensory deficits were reported, and normal lower extremity motor function was noted,   the above injectate was administered  so that equal amounts of the injectate were placed at each foramen (level) into the transforaminal epidural space.   Additional Comments:  No complications occurred Dressing: 2 x 2 sterile gauze and Band-Aid    Post-procedure details: Patient was observed during the procedure. Post-procedure instructions were  reviewed.  Patient left the clinic in stable condition.    Clinical History: CLINICAL DATA:  Lumbar radiculopathy. Persistent pain. Lower back pain radiating into the both legs.   EXAM: MRI LUMBAR SPINE WITHOUT CONTRAST   TECHNIQUE: Multiplanar, multisequence MR imaging of the lumbar spine was performed. No intravenous contrast was administered.   COMPARISON:  Radiographs dated Feb 19, 2023   FINDINGS: Segmentation:   5 non rib-bearing lumbar type vertebral bodies are present. The lowest fully formed vertebral body is L5.   Alignment:  Levoscoliosis centered at L1-L2 disc space.   Vertebrae:  No fracture, evidence of discitis, or bone lesion.   Conus medullaris and cauda equina: Conus extends to the L1-L2 disc level. Conus and cauda equina appear normal.   Paraspinal and other soft tissues: Negative.   Disc levels:   T12-L1: Disc height loss and disc bulge with mild bilateral lateral recess stenosis. No significant neural foraminal stenosis. Mild-to-moderate bilateral facet joint arthropathy.   L1-L2: Disc height loss and right paracentral and extraforaminal disc bulge with severe right lateral recess stenosis. Moderate right neural foraminal stenosis. Mild left lateral recess stenosis. Mild-to-moderate bilateral facet joint arthropathy.   L2-L3: Disc height loss and circumferential disc bulge with severe bilateral lateral recess stenosis. Moderate right and mild left neural foraminal stenosis.   L3-L4: Disc height loss and left sided disc bulge with severe left lateral recess stenosis. Moderate right lateral recess stenosis. Mild right and moderate left neural foraminal stenosis. Moderate bilateral facet joint arthropathy.   L4-L5: Disc height loss and circumferential disc bulge. Ligamentum flavum hypertrophy with spinal canal stenosis. Moderate bilateral facet joint arthropathy. Severe bilateral lateral recess stenosis. Moderate left neural foraminal stenosis    L5-S1: Disc height loss and circumferential disc bulge. Severe bilateral lateral recess stenosis. Moderate bilateral neural foraminal stenosis. Moderate bilateral facet joint arthropathy.   IMPRESSION: 1. Advanced multilevel degenerate disc disease with moderate levoscoliosis centered at L1-L2. 2. Severe right lateral recess stenosis at L1-L2. 3. Severe bilateral lateral recess stenosis at L2-L3. 4. Severe left lateral recess stenosis at L3-L4. 5. Severe bilateral lateral recess stenosis at L4-L5 and L5-S1. 6. Moderate right neural foraminal stenosis at L1-L2 and L2-L3. 7. Moderate left neural foraminal stenosis at L3-L4, L4-L5, and L5-S1.     Electronically Signed   By: Larose Hires D.O.   On: 04/02/2023 11:15     Objective:  VS:  HT:    WT:   BMI:     BP:(!) 143/74  HR:90bpm  TEMP: ( )  RESP:  Physical Exam Vitals and nursing note reviewed.  Constitutional:      General: He is not in acute distress.    Appearance: Normal appearance. He is not ill-appearing.  HENT:     Head: Normocephalic and atraumatic.     Right Ear: External ear normal.     Left Ear: External ear normal.     Nose: No congestion.  Eyes:     Extraocular Movements: Extraocular movements intact.  Cardiovascular:     Rate and Rhythm: Normal rate.     Pulses: Normal pulses.  Pulmonary:     Effort: Pulmonary effort is normal. No respiratory distress.  Abdominal:  General: There is no distension.     Palpations: Abdomen is soft.  Musculoskeletal:        General: No tenderness or signs of injury.     Cervical back: Neck supple.     Right lower leg: No edema.     Left lower leg: No edema.     Comments: Patient has good distal strength without clonus.  Skin:    Findings: No erythema or rash.  Neurological:     General: No focal deficit present.     Mental Status: He is alert and oriented to person, place, and time.     Sensory: No sensory deficit.     Motor: No weakness or abnormal muscle  tone.     Coordination: Coordination normal.  Psychiatric:        Mood and Affect: Mood normal.        Behavior: Behavior normal.      Imaging: No results found.

## 2023-04-23 NOTE — Patient Instructions (Signed)

## 2023-04-23 NOTE — Progress Notes (Signed)
Functional Pain Scale - descriptive words and definitions  Uncomfortable (3)  Pain is present but can complete all ADL's/sleep is slightly affected and passive distraction only gives marginal relief. Mild range order  Average Pain 4   +Driver, -BT, -Dye Allergies.  Lower back pain on left side that goes into the left leg

## 2023-04-30 ENCOUNTER — Ambulatory Visit: Payer: Medicare HMO | Admitting: Orthopedic Surgery

## 2023-04-30 DIAGNOSIS — M5416 Radiculopathy, lumbar region: Secondary | ICD-10-CM | POA: Diagnosis not present

## 2023-04-30 NOTE — Progress Notes (Signed)
Orthopedic Spine Surgery Office Note   Assessment: Patient is a 82 y.o. male with with chronic low back pain and sagittal imbalance (T1 pelvis angle 24, SVA of 7) and radicular left leg pain     Plan: -Patient has tried activity modification, naproxen, tylenol, injections, lyrica  -Patient did not get any relief of his leg pain with the injection, so did not recommend surgery at this time.  Recommended EMG/NCS for further workup.  Referral provided at today -He said he did not get any relief with Lyrica, so I told him to discontinue that.  He has been taking naproxen which helps a little bit.  He asked what dose of Tylenol he can take.  I told him he could take 1000 mg 3 times daily in addition to the naproxen -He is not interested in any kind of spine surgery since he knows people that have had complications, so will try to treat him with medications -Patient should return to office in 5 weeks, x-rays at next visit: None     Patient expressed understanding of the plan and all questions were answered to the patient's satisfaction.    ___________________________________________________________________________   History: Patient is a 82 y.o. male who has been previously seen in the office for symptoms of low back pain and left leg pain.  After last visit, patient got an injection.  The injection was targeted at the left L4 nerve.  He states that he got 50% relief of his back pain with the injection but did not get any relief of his leg pain.  He still is not having any right lower extremity symptoms.  He says that the pain is currently tolerable but is still present on a daily basis. Feels the pain along the lateral and anterior aspect of the leg.   Previous treatments: activity modification, naproxen, tylenol, injections, lyrica    Physical Exam:   General: no acute distress, appears stated age Neurologic: alert, answering questions appropriately, following commands Respiratory: unlabored  breathing on room air, symmetric chest rise Psychiatric: appropriate affect, normal cadence to speech     MSK (spine):   -Strength exam                                                   Left                  Right EHL                              4/5                  4/5 TA                                 5/5                  5/5 GSC                             5/5                  5/5 Knee extension            5/5  5/5 Hip flexion                    5/5                  5/5   -Sensory exam                           Sensation intact to light touch in L3-S1 nerve distributions of bilateral lower extremities   -Straight leg raise: Negative bilaterally -Femoral nerve stretch test: Negative bilaterally -Clonus: no beats bilaterally   Imaging: XR of the lumbar spine from 02/19/2023 was previously independently reviewed and interpreted, showing PI-LL mismatch.  No fracture or dislocation seen.  Disc height loss at T12/L1, L1/L2, L2/L3, L5/S1.  There is anterior osteophyte formation seen at those levels as well.  Lumbar scoliosis with apex to the left at L2/3 disc space.   MRI of the lumbar spine from 03/28/2023 was previously independently reviewed and interpreted, showing DDD throughout the lumbar spine.  Lateral recess stenosis at L2/3.  Lateral recess stenosis at L4/5.  Central disc herniation and right lateral recess stenosis at L5/S1.  No significant central stenosis.  Foraminal stenosis at multiple levels but worst at L3/4 on the right, L5/S1 bilaterally, and left L4/5.     Patient name: Jesse [name] Patient MRN: [id number] Date of visit: 04/30/23

## 2023-05-09 ENCOUNTER — Encounter: Payer: Self-pay | Admitting: Neurology

## 2023-05-11 ENCOUNTER — Other Ambulatory Visit: Payer: Self-pay | Admitting: Orthopedic Surgery

## 2023-05-29 ENCOUNTER — Ambulatory Visit: Payer: Medicare HMO | Admitting: Neurology

## 2023-05-29 ENCOUNTER — Other Ambulatory Visit: Payer: Self-pay

## 2023-05-29 DIAGNOSIS — R202 Paresthesia of skin: Secondary | ICD-10-CM | POA: Diagnosis not present

## 2023-05-29 DIAGNOSIS — M5416 Radiculopathy, lumbar region: Secondary | ICD-10-CM

## 2023-05-29 NOTE — Procedures (Signed)
  [facility]  [address] [place], [address]  [place], [place] [address] Tel: [phone] Fax: [phone] Test Date:  05/29/2023  Patient: Jesse [name] DOB: 12-Aug-1941 Physician: Nita Sickle, DO  Sex: Male Height: 5\' 5"  Ref Phys: Willia Craze, MD  ID#: [id number]   Technician:    History: This is a 82 year old man referred for evaluation of left leg pain and paresthesias.  NCV & EMG Findings: Extensive electrodiagnostic testing of the left lower extremity shows:  Left sural and superficial peroneal sensory responses are within normal limits. Left peroneal and tibial motor responses are within normal limits. Left tibial H reflex study is within normal limits. Chronic motor axon loss changes are seen affecting all of the tested muscles involving the L2-S1 myotomes, without accompanying active denervation.  Impression: Chronic multilevel lumbosacral radiculopathies affecting the left L2-L4 (moderate) and L5-S1 (mild) nerve roots/segments. There is no evidence of a large fiber sensorimotor polyneuropathy affecting the left lower extremity.   ___________________________ Nita Sickle, DO    Nerve Conduction Studies   Stim Site NR Peak (ms) Norm Peak (ms) O-P Amp (V) Norm O-P Amp  Left Sup Peroneal Anti Sensory (Ant Lat Mall)  32 C  12 cm    2.3 <4.6 6.2 >3  Left Sural Anti Sensory (Lat Mall)  32 C  Calf    3.2 <4.6 8.6 >3     Stim Site NR Onset (ms) Norm Onset (ms) O-P Amp (mV) Norm O-P Amp Site1 Site2 Delta-0 (ms) Dist (cm) Vel (m/s) Norm Vel (m/s)  Left Peroneal Motor (Ext Dig Brev)  32 C  Ankle    4.3 <6.0 2.5 >2.5 B Fib Ankle 6.4 35.0 55 >40  B Fib    10.7  2.5  Poplt B Fib 1.6 8.0 50 >40  Poplt    12.3  2.3         Left Tibial Motor (Abd Hall Brev)  32 C  Ankle    4.3 <6.0 17.7 >4 Knee Ankle 7.0 40.0 57 >40  Knee    11.3  12.8          Electromyography   Side Muscle Ins.Act Fibs Fasc Recrt Amp Dur Poly Activation Comment  Left AntTibialis Nml Nml Nml  *1- *1+ *1+ *1+ Nml N/A  Left Gastroc Nml Nml Nml *1- *1+ *1+ *1+ Nml N/A  Left Flex Dig Long Nml Nml Nml *2- *1+ *1+ *1+ Nml N/A  Left BicepsFemS Nml Nml Nml *1- *1+ *1+ *1+ Nml N/A  Left RectFemoris Nml Nml Nml *2- *1+ *1+ *1+ Nml N/A  Left GluteusMed Nml Nml Nml *1- *1+ *1+ *1+ Nml N/A  Left AdductorLong Nml Nml Nml *2- *1+ *1+ *1+ Nml N/A      Waveforms:

## 2023-06-03 DIAGNOSIS — E7800 Pure hypercholesterolemia, unspecified: Secondary | ICD-10-CM | POA: Diagnosis not present

## 2023-06-03 DIAGNOSIS — I700 Atherosclerosis of aorta: Secondary | ICD-10-CM | POA: Diagnosis not present

## 2023-06-03 DIAGNOSIS — I10 Essential (primary) hypertension: Secondary | ICD-10-CM | POA: Diagnosis not present

## 2023-06-03 DIAGNOSIS — M519 Unspecified thoracic, thoracolumbar and lumbosacral intervertebral disc disorder: Secondary | ICD-10-CM | POA: Diagnosis not present

## 2023-06-03 DIAGNOSIS — E538 Deficiency of other specified B group vitamins: Secondary | ICD-10-CM | POA: Diagnosis not present

## 2023-06-03 DIAGNOSIS — D45 Polycythemia vera: Secondary | ICD-10-CM | POA: Diagnosis not present

## 2023-06-03 DIAGNOSIS — Z23 Encounter for immunization: Secondary | ICD-10-CM | POA: Diagnosis not present

## 2023-06-03 DIAGNOSIS — Z0000 Encounter for general adult medical examination without abnormal findings: Secondary | ICD-10-CM | POA: Diagnosis not present

## 2023-06-03 DIAGNOSIS — Q6100 Congenital renal cyst, unspecified: Secondary | ICD-10-CM | POA: Diagnosis not present

## 2023-06-04 ENCOUNTER — Ambulatory Visit: Payer: Medicare HMO | Admitting: Orthopedic Surgery

## 2023-06-04 DIAGNOSIS — M5416 Radiculopathy, lumbar region: Secondary | ICD-10-CM | POA: Diagnosis not present

## 2023-06-04 MED ORDER — GABAPENTIN 100 MG PO CAPS: 100.0000 mg | ORAL_CAPSULE | Freq: Three times a day (TID) | ORAL | 0 refills | Status: DC

## 2023-06-04 NOTE — Progress Notes (Signed)
Orthopedic Spine Surgery Office Note   Assessment: Patient is a 82 y.o. male with with chronic low back pain and sagittal imbalance (T1 pelvis angle 24, SVA of 7) and radicular left leg pain     Plan: -Patient has tried activity modification, naproxen, tylenol, injections, lyrica  -Told him that his EMG showed evidence of radiculopathy.  He has not gotten relief with an injection and his last one was less than 3 months ago, so even though we talked about doing an injection, will hold off for now -He has gotten some relief with naproxen, so told him to continue that.  He did not get relief with Lyrica and said he has not tried gabapentin.  Prescription was provided for gabapentin today -He is still not interested in any kind of spine surgery since he knows people that have had complications, so will try to treat him with medications -Patient should return to office in 6 weeks, x-rays at next visit: None     Patient expressed understanding of the plan and all questions were answered to the patient's satisfaction.    ___________________________________________________________________________   History: Patient is a 82 y.o. male who has been previously seen in the office for symptoms of low back pain and left leg pain.  Patient still having low back pain and left lateral leg pain.  He is not have any right leg pain.  He says his pain is slightly better otherwise there has been no changes in his symptoms since last time he was seen.  Did not get much relief with Lyrica.  Feels that naproxen has been somewhat helpful.   Previous treatments: activity modification, naproxen, tylenol, injections, lyrica     Physical Exam:   General: no acute distress, appears stated age Neurologic: alert, answering questions appropriately, following commands Respiratory: unlabored breathing on room air, symmetric chest rise Psychiatric: appropriate affect, normal cadence to speech     MSK (spine):   -Strength  exam                                                   Left                  Right EHL                              4/5                  4/5 TA                                 5/5                  5/5 GSC                             5/5                  5/5 Knee extension            5/5                  5/5 Hip flexion  5/5                  5/5   -Sensory exam                           Sensation intact to light touch in L3-S1 nerve distributions of bilateral lower extremities   -Straight leg raise: Negative bilaterally -Femoral nerve stretch test: Negative bilaterally -Clonus: no beats bilaterally   Imaging: XR of the lumbar spine from 02/19/2023 was previously independently reviewed and interpreted, showing PI-LL mismatch.  No fracture or dislocation seen.  Disc height loss at T12/L1, L1/L2, L2/L3, L5/S1.  There is anterior osteophyte formation seen at those levels as well.  Lumbar scoliosis with apex to the left at L2/3 disc space.   MRI of the lumbar spine from 03/28/2023 was previously independently reviewed and interpreted, showing DDD throughout the lumbar spine.  Lateral recess stenosis at L2/3.  Lateral recess stenosis at L4/5.  Central disc herniation and right lateral recess stenosis at L5/S1.  No significant central stenosis.  Foraminal stenosis at multiple levels but worst at L3/4 on the right, L5/S1 bilaterally, and left L4/5.   NCS/EMG from 05/29/2023 was reviewed and showed multilevel radiculopathies affecting L2-4 and L5-S1 nerves    Patient name: Jesse [name] Patient MRN: [id number] Date of visit: 06/04/23

## 2023-07-01 ENCOUNTER — Telehealth: Payer: Self-pay | Admitting: Orthopedic Surgery

## 2023-07-01 MED ORDER — GABAPENTIN 100 MG PO CAPS: 100.0000 mg | ORAL_CAPSULE | Freq: Three times a day (TID) | ORAL | 2 refills | Status: DC

## 2023-07-01 NOTE — Telephone Encounter (Signed)
Pt requesting 90 day supply of Gabapentin  sent to [facility] mail order pharmacy [phone] [address] [place] [address]

## 2023-07-02 ENCOUNTER — Inpatient Hospital Stay: Payer: Medicare HMO

## 2023-07-02 ENCOUNTER — Inpatient Hospital Stay: Payer: Medicare HMO | Attending: Family

## 2023-07-02 ENCOUNTER — Other Ambulatory Visit: Payer: Self-pay

## 2023-07-02 ENCOUNTER — Inpatient Hospital Stay: Payer: Medicare HMO | Admitting: Medical Oncology

## 2023-07-02 ENCOUNTER — Encounter: Payer: Self-pay | Admitting: Medical Oncology

## 2023-07-02 VITALS — BP 113/83 | HR 89 | Temp 98.3°F | Resp 20 | Ht 66.0 in | Wt 146.0 lb

## 2023-07-02 VITALS — BP 112/73 | HR 77 | Temp 98.2°F | Resp 18

## 2023-07-02 DIAGNOSIS — D45 Polycythemia vera: Secondary | ICD-10-CM

## 2023-07-02 DIAGNOSIS — D500 Iron deficiency anemia secondary to blood loss (chronic): Secondary | ICD-10-CM

## 2023-07-02 DIAGNOSIS — D751 Secondary polycythemia: Secondary | ICD-10-CM

## 2023-07-02 LAB — CBC WITH DIFFERENTIAL (CANCER CENTER ONLY)
Abs Immature Granulocytes: 0.04 10*3/uL (ref 0.00–0.07)
Basophils Absolute: 0 10*3/uL (ref 0.0–0.1)
Basophils Relative: 1 %
Eosinophils Absolute: 0.1 10*3/uL (ref 0.0–0.5)
Eosinophils Relative: 1 %
HCT: 50.4 % (ref 39.0–52.0)
Hemoglobin: 17.1 g/dL — ABNORMAL HIGH (ref 13.0–17.0)
Immature Granulocytes: 1 %
Lymphocytes Relative: 25 %
Lymphs Abs: 1.8 10*3/uL (ref 0.7–4.0)
MCH: 31.1 pg (ref 26.0–34.0)
MCHC: 33.9 g/dL (ref 30.0–36.0)
MCV: 91.6 fL (ref 80.0–100.0)
Monocytes Absolute: 1 10*3/uL (ref 0.1–1.0)
Monocytes Relative: 14 %
Neutro Abs: 4.1 10*3/uL (ref 1.7–7.7)
Neutrophils Relative %: 58 %
Platelet Count: 230 10*3/uL (ref 150–400)
RBC: 5.5 MIL/uL (ref 4.22–5.81)
RDW: 15.7 % — ABNORMAL HIGH (ref 11.5–15.5)
WBC Count: 7 10*3/uL (ref 4.0–10.5)
nRBC: 0 % (ref 0.0–0.2)

## 2023-07-02 LAB — CMP (CANCER CENTER ONLY)
ALT: 17 U/L (ref 0–44)
AST: 16 U/L (ref 15–41)
Albumin: 4 g/dL (ref 3.5–5.0)
Alkaline Phosphatase: 53 U/L (ref 38–126)
Anion gap: 8 (ref 5–15)
BUN: 18 mg/dL (ref 8–23)
CO2: 28 mmol/L (ref 22–32)
Calcium: 10.4 mg/dL — ABNORMAL HIGH (ref 8.9–10.3)
Chloride: 97 mmol/L — ABNORMAL LOW (ref 98–111)
Creatinine: 0.74 mg/dL (ref 0.61–1.24)
GFR, Estimated: >60 mL/min (ref >60)
Glucose, Bld: 103 mg/dL — ABNORMAL HIGH (ref 70–99)
Potassium: 5.1 mmol/L (ref 3.5–5.1)
Sodium: 133 mmol/L — ABNORMAL LOW (ref 135–145)
Total Bilirubin: 1.2 mg/dL (ref 0.3–1.2)
Total Protein: 6.8 g/dL (ref 6.5–8.1)

## 2023-07-02 LAB — IRON AND IRON BINDING CAPACITY (CC-WL,HP ONLY)
Iron: 135 ug/dL (ref 45–182)
Saturation Ratios: 34 % (ref 17.9–39.5)
TIBC: 403 ug/dL (ref 250–450)
UIBC: 268 ug/dL (ref 117–376)

## 2023-07-02 LAB — FERRITIN: Ferritin: 20 ng/mL — ABNORMAL LOW (ref 24–336)

## 2023-07-02 MED ORDER — SODIUM CHLORIDE 0.9 % IV SOLN: Freq: Once | INTRAVENOUS | Status: AC

## 2023-07-02 NOTE — Progress Notes (Signed)
Fran Lowes presents today for phlebotomy per MD orders. Phlebotomy procedure started at 11:05 am and ended at 11:38 am.  338 grams removed. Patient asked to stop the phlebotomy because he was feeling fatigued.  Hydration started and gave 500 mls NS per orders. Patient observed for 30 minutes after procedure without any incident. Patient had post phlebotomy snacks and drinks. Patient tolerated procedure well. 18 G IV needle removed intact from right AC. Vital signs stable at discharge.

## 2023-07-02 NOTE — Progress Notes (Signed)
Hematology and Oncology Follow Up Visit  Jesse [name] [id number] [birth year] 82 y.o. 07/02/2023   Principle Diagnosis:  Polycythemia - JAK2 negative   Current Therapy:        Phlebotomy to maintain hematocrit below 45% Aspirin 81 mg by mouth daily   Interim History:  Jesse [name] is here today for follow-up.  He reports that he is doing ok. Fatigue has improved with oral B12.  No blood loss, bruising or petechiae.  No fever, chills, n/v, cough, rash, dizziness, SOB, chest pain, palpitations, abdominal pain or changes in bowel or bladder habits at this time.  Joint and lower back pain secondary to arthritis waxes and wanes.  No numbness or tingling in his extremities at this time.  No falls or syncope reported.  Appetite and hydration are good.  Wt Readings from Last 3 Encounters:  07/02/23 146 lb (66.2 kg)  04/04/23 150 lb (68 kg)  02/13/23 152 lb (68.9 kg)   ECOG Performance Status: 1 - Symptomatic but completely ambulatory  Medications:  Allergies as of 07/02/2023       Reactions   Hctz [hydrochlorothiazide] Other (See Comments)   Hyponatremia ; Na+ 111, w weakness resulting in mechanical fall   Tramadol Nausea And Vomiting        Medication List        Accurate as of July 02, 2023 10:49 AM. If you have any questions, ask your nurse or doctor.          ALPRAZolam 1 MG tablet Commonly known as: XANAX Take 0.5 mg by mouth at bedtime.   Cholecalciferol 50 MCG (2000 UT) Caps Take 2,000 Units by mouth daily.   fish oil-omega-3 fatty acids 1000 MG capsule Take 1 g by mouth daily.   Fluad Quadrivalent 0.5 ML injection Generic drug: influenza vaccine adjuvanted Inject into the muscle.   gabapentin 100 MG capsule Commonly known as: NEURONTIN Take 1 capsule (100 mg total) by mouth 3 (three) times daily.   lisinopril 20 MG tablet Commonly known as: ZESTRIL Take 1 tablet (20 mg total) by mouth daily.   naproxen 500 MG tablet Commonly known as:  NAPROSYN TAKE 1 TABLET BY MOUTH 2 TIMES DAILY WITH A MEAL.   simvastatin 20 MG tablet Commonly known as: ZOCOR Take 1 tablet (20 mg total) by mouth daily at 6 PM.   tamsulosin 0.4 MG Caps capsule Commonly known as: FLOMAX Take 0.4 mg by mouth daily.   vitamin B-12 100 MCG tablet Commonly known as: CYANOCOBALAMIN Take 100 mcg by mouth daily.   vitamin E 1000 UNIT capsule Take 1,000 Units by mouth daily.        Allergies:  Allergies  Allergen Reactions   Hctz [Hydrochlorothiazide] Other (See Comments)    Hyponatremia ; Na+ 111, w weakness resulting in mechanical fall   Tramadol Nausea And Vomiting    Past Medical History, Surgical history, Social history, and Family History were reviewed and updated.  Review of Systems: All other 10 point review of systems is negative.   Physical Exam:  height is 5\' 6"  (1.676 m) and weight is 146 lb (66.2 kg). His oral temperature is 98.3 F (36.8 C). His blood pressure is 113/83 and his pulse is 89. His respiration is 20 and oxygen saturation is 100%.   Wt Readings from Last 3 Encounters:  07/02/23 146 lb (66.2 kg)  04/04/23 150 lb (68 kg)  02/13/23 152 lb (68.9 kg)    Ocular: Sclerae unicteric, pupils equal, round and reactive  to light Ear-nose-throat: Oropharynx clear, dentition fair Lymphatic: No cervical or supraclavicular adenopathy Lungs no rales or rhonchi, good excursion bilaterally Heart regular rate and rhythm, no murmur appreciated Abd soft, nontender, positive bowel sounds MSK no focal spinal tenderness, no joint edema Neuro: non-focal, well-oriented, appropriate affect   Lab Results  Component Value Date   WBC 7.0 07/02/2023   HGB 17.1 (H) 07/02/2023   HCT 50.4 07/02/2023   MCV 91.6 07/02/2023   PLT 230 07/02/2023   Lab Results  Component Value Date   FERRITIN 16 (L) 04/04/2023   IRON 101 04/04/2023   TIBC 406 04/04/2023   UIBC 305 04/04/2023   IRONPCTSAT 25 04/04/2023   Lab Results  Component Value  Date   RETICCTPCT 1.6 12/11/2021   RBC 5.50 07/02/2023   RETICCTABS 87.5 01/02/2011   No results found for: "KPAFRELGTCHN", "LAMBDASER", "KAPLAMBRATIO" No results found for: "IGGSERUM", "IGA", "IGMSERUM" No results found for: "TOTALPROTELP", "ALBUMINELP", "A1GS", "A2GS", "BETS", "BETA2SER", "GAMS", "MSPIKE", "SPEI"   Chemistry      Component Value Date/Time   NA 133 (L) 07/02/2023 0946   NA 136 08/19/2017 1149   NA 135 (L) 10/21/2016 1151   K 5.1 07/02/2023 0946   K 3.8 08/19/2017 1149   K 3.9 10/21/2016 1151   CL 97 (L) 07/02/2023 0946   CL 95 (L) 08/19/2017 1149   CO2 28 07/02/2023 0946   CO2 28 08/19/2017 1149   CO2 25 10/21/2016 1151   BUN 18 07/02/2023 0946   BUN 14 08/19/2017 1149   BUN 13.6 10/21/2016 1151   CREATININE 0.74 07/02/2023 0946   CREATININE 1.0 08/19/2017 1149   CREATININE 0.8 10/21/2016 1151      Component Value Date/Time   CALCIUM 10.4 (H) 07/02/2023 0946   CALCIUM 10.1 08/19/2017 1149   CALCIUM 10.3 10/21/2016 1151   ALKPHOS 53 07/02/2023 0946   ALKPHOS 68 08/19/2017 1149   ALKPHOS 68 10/21/2016 1151   AST 16 07/02/2023 0946   AST 25 10/21/2016 1151   ALT 17 07/02/2023 0946   ALT 32 08/19/2017 1149   ALT 38 10/21/2016 1151   BILITOT 1.2 07/02/2023 0946   BILITOT 1.22 (H) 10/21/2016 1151      Encounter Diagnosis  Name Primary?   Polycythemia rubra vera (HCC) Yes    Impression and Plan: Jesse [name] is a very pleasant 82 yo caucasian gentleman with polycythemia, JAK-2 negative.   Phlebotomy recommended today for Hct 50.4%. 500 ml IV replacement after given that he sometimes feels "bad" after his phlebotomy.  RTC 3 months APP, labs, phlebotomy-[place]  Rushie Chestnut, PA-C 10/9/202410:49 AM

## 2023-07-02 NOTE — Patient Instructions (Signed)
Dehydration, Adult Dehydration is a condition in which there is not enough water or other fluids in the body. This happens when a person loses more fluids than they take in. Important organs cannot work right without the right amount of fluids. Any loss of fluids from the body can cause dehydration. Dehydration can be mild, worse, or very bad. It should be treated right away to keep it from getting very bad. What are the causes? Conditions that cause loss of water in the body. They include: Watery poop (diarrhea). Vomiting. Sweating a lot. Fever. Infection. Peeing (urinating) a lot. Not drinking enough fluids. Certain medicines, such as medicines that take extra fluid out of the body (diuretics). Lack of safe drinking water. Not being able to get enough water and food. What increases the risk? Having a long-term (chronic) illness that has not been treated the right way, such as: Diabetes. Heart disease. Kidney disease. Being 81 years of age or older. Having a disability. Living in a place that is high above the ground or sea (high in altitude). The thinner, drier air causes more fluid loss. Doing exercises that put stress on your body for a long time. Being active when in hot places. What are the signs or symptoms? Symptoms of dehydration depend on how bad it is. Mild or worse dehydration Thirst. Dry lips or dry mouth. Feeling dizzy or light-headed. Muscle cramps. Passing little pee or dark pee. Pee may be the color of tea. Headache. Very bad dehydration Changes in skin. Skin may: Be cold to the touch (clammy). Be blotchy or pale. Not go back to normal right after you pinch it and let it go. Little or no tears, pee, or sweat. Fast breathing. Low blood pressure. Weak pulse. Pulse that is more than 100 beats a minute when you are sitting still. Other changes, such as: Feeling very thirsty. Eyes that look hollow (sunken). Cold hands and feet. Being confused. Being very  tired (lethargic) or having trouble waking from sleep. Losing weight. Loss of consciousness. How is this treated? Treatment for this condition depends on how bad your dehydration is. Treatment should start right away. Do not wait until your condition gets very bad. Very bad dehydration is an emergency. You will need to go to a hospital. Mild or worse dehydration can be treated at home. You may be asked to: Drink more fluids. Drink an oral rehydration solution (ORS). This drink gives you the right amount of fluids, salts, and minerals (electrolytes). Very bad dehydration can be treated: With fluids through an IV tube. By correcting low levels of electrolytes in the body. By treating the problem that caused your dehydration. Follow these instructions at home: Oral rehydration solution If told by your doctor, drink an ORS: Make an ORS. Use instructions on the package. Start by drinking small amounts, about  cup (120 mL) every 5-10 minutes. Slowly drink more until you have had the amount that your doctor said to have.  Eating and drinking  Drink enough clear fluid to keep your pee pale yellow. If you were told to drink an ORS, finish the ORS first. Then, start slowly drinking other clear fluids. Drink fluids such as: Water. Do not drink only water. Doing that can make the salt (sodium) level in your body get too low. Water from ice chips you suck on. Fruit juice that you have added water to (diluted). Low-calorie sports drinks. Eat foods that have the right amounts of salts and minerals, such as bananas, oranges, potatoes,  tomatoes, or spinach. Do not drink alcohol. Avoid drinks that have caffeine or sugar. These include:: High-calorie sports drinks. Fruit juice that you did not add water to. Soda. Coffee or energy drinks. Avoid foods that are greasy or have a lot of fat or sugar. General instructions Take over-the-counter and prescription medicines only as told by your doctor. Do  not take sodium tablets. Doing that can make the salt level in your body get too high. Return to your normal activities as told by your doctor. Ask your doctor what activities are safe for you. Keep all follow-up visits. Your doctor may check and change your treatment. Contact a doctor if: You have pain in your belly (abdomen) and the pain: Gets worse. Stays in one place. You have a rash. You have a stiff neck. You get angry or annoyed more easily than normal. You are more tired or have a harder time waking than normal. You feel weak or dizzy. You feel very thirsty. Get help right away if: You have any symptoms of very bad dehydration. You vomit every time you eat or drink. Your vomiting gets worse, does not go away, or you vomit blood or green stuff. You are getting treatment, but symptoms are getting worse. You have a fever. You have a very bad headache. You have: Diarrhea that gets worse or does not go away. Blood in your poop (stool). This may cause poop to look black and tarry. No pee in 6-8 hours. Only a small amount of pee in 6-8 hours, and the pee is very dark. You have trouble breathing. These symptoms may be an emergency. Get help right away. Call 911. Do not wait to see if the symptoms will go away. Do not drive yourself to the hospital. This information is not intended to replace advice given to you by your health care provider. Make sure you discuss any questions you have with your health care provider. Document Revised: 04/08/2022 Document Reviewed: 04/08/2022 Elsevier Patient Education  2024 [employer] Therapeutic Phlebotomy Therapeutic phlebotomy is the planned removal of blood from a person's body for the purpose of treating a medical condition. The procedure is lot like donating blood. Usually, about a pint (470 mL, or 0.47 L) of blood is removed. The average adult has 9-12 pints (4.3-5.7 L) of blood in his or her body. Therapeutic phlebotomy may be used to  treat the following medical conditions: Hemochromatosis. This is a condition in which the blood contains too much iron. Polycythemia vera. This is a condition in which the blood contains too many red blood cells. Porphyria cutanea tarda. This is a disease in which an important part of hemoglobin is not made properly. It results in the buildup of abnormal amounts of porphyrins in the body. Sickle cell disease. This is a condition in which the red blood cells form an abnormal crescent shape rather than a round shape. Tell a health care provider about: Any allergies you have. All medicines you are taking, including vitamins, herbs, eye drops, creams, and over-the-counter medicines. Any bleeding problems you have. Any surgeries you have had. Any medical conditions you have. Whether you are pregnant or may be pregnant. What are the risks? Generally, this is a safe procedure. However, problems may occur, including: Nausea or light-headedness. Low blood pressure (hypotension). Soreness, bleeding, swelling, or bruising at the needle insertion site. Infection. What happens before the procedure? Ask your health care provider about: Changing or stopping your regular medicines. This is especially important if you are  taking diabetes medicines or blood thinners. Taking medicines such as aspirin and ibuprofen. These medicines can thin your blood. Do not take these medicines unless your health care provider tells you to take them. Taking over-the-counter medicines, vitamins, herbs, and supplements. Wear clothing with sleeves that can be raised above the elbow. You may have a blood sample taken. Your blood pressure, pulse rate, and breathing rate will be measured. What happens during the procedure?  You may be given a medicine to numb the area (local anesthetic). A tourniquet will be placed on your arm. A needle will be put into one of your veins. Tubing and a collection bag will be attached to the  needle. Blood will flow through the needle and tubing into the collection bag. The collection bag will be placed lower than your arm so gravity can help the blood flow into the bag. You may be asked to open and close your hand slowly and continually during the entire collection. After the specified amount of blood has been removed from your body, the collection bag and tubing will be clamped. The needle will be removed from your vein. Pressure will be held on the needle site to stop the bleeding. A bandage (dressing) will be placed over the needle insertion site. The procedure may vary among health care providers and hospitals. What happens after the procedure? Your blood pressure, pulse rate, and breathing rate will be measured after the procedure. You will be encouraged to drink fluids. You will be encouraged to eat a snack to prevent a low blood sugar level. Your recovery will be assessed and monitored. Return to your normal activities as told by your health care provider. Summary Therapeutic phlebotomy is the planned removal of blood from a person's body for the purpose of treating a medical condition. Therapeutic phlebotomy may be used to treat hemochromatosis, polycythemia vera, porphyria cutanea tarda, or sickle cell disease. In the procedure, a needle is inserted and about a pint (470 mL, or 0.47 L) of blood is removed. The average adult has 9-12 pints (4.3-5.7 L) of blood in the body. This is generally a safe procedure, but it can sometimes cause problems such as nausea, light-headedness, or low blood pressure (hypotension). This information is not intended to replace advice given to you by your health care provider. Make sure you discuss any questions you have with your health care provider. Document Revised: 03/07/2021 Document Reviewed: 03/07/2021 Elsevier Patient Education  2024 [employer].

## 2023-07-08 ENCOUNTER — Other Ambulatory Visit: Payer: Self-pay | Admitting: Orthopedic Surgery

## 2023-07-16 ENCOUNTER — Ambulatory Visit: Payer: Medicare HMO | Admitting: Orthopedic Surgery

## 2023-07-16 DIAGNOSIS — M5416 Radiculopathy, lumbar region: Secondary | ICD-10-CM

## 2023-07-16 NOTE — Progress Notes (Signed)
Orthopedic Spine Surgery Office Note   Assessment: Patient is a 82 y.o. 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 Alvester Morin. Placed a referral today -He feels that naproxen has been helpful, so told him to keep going with that.  He has noticed improvement in his radiating leg pain with the gabapentin.  He is currently on 100 mg 3 times daily.  Told him that he could increase it to 200 mg 3 times daily.  At our next visit, if he is tolerating it well, will increase to 300 mg 3 times daily -Patient should return to office in 4 weeks, x-rays at next visit: none     Patient expressed understanding of the plan and all questions were answered to the patient's satisfaction.    ___________________________________________________________________________   History: Patient is a 81 y.o. male who has been previously seen in the office for symptoms of low back pain and left leg pain.  He is still reporting low back pain and left leg pain.  He still feels the pain in the left anterolateral leg.  He feels that the gabapentin has provided him with some relief.  He is now on 100 mg 3 times daily.  He has not noticed any new symptoms since last time he was seen.   Previous treatments: activity modification, naproxen, tylenol, injections, lyrica, gabapentin     Physical Exam:   General: no acute distress, appears stated age Neurologic: alert, answering questions appropriately, following commands Respiratory: unlabored breathing on room air, symmetric chest rise Psychiatric: appropriate affect, normal cadence to speech     MSK (spine):   -Strength exam                                                   Left                  Right EHL                              4/5                   4/5 TA                                 5/5                  5/5 GSC                             5/5                  5/5 Knee extension            5/5                  5/5 Hip flexion                    5/5  5/5   -Sensory exam                           Sensation intact to light touch in L3-S1 nerve distributions of bilateral lower extremities   -Straight leg raise: Negative bilaterally -Femoral nerve stretch test: Negative bilaterally -Clonus: no beats bilaterally   Imaging: XR of the lumbar spine from 02/19/2023 was previously independently reviewed and interpreted, showing PI-LL mismatch.  No fracture or dislocation seen.  Disc height loss at T12/L1, L1/L2, L2/L3, L5/S1.  There is anterior osteophyte formation seen at those levels as well.  Lumbar scoliosis with apex to the left at L2/3 disc space.   MRI of the lumbar spine from 03/28/2023 was previously independently reviewed and interpreted, showing DDD throughout the lumbar spine.  Lateral recess stenosis at L2/3.  Lateral recess stenosis at L4/5.  Central disc herniation and right lateral recess stenosis at L5/S1.  No significant central stenosis.  Foraminal stenosis at multiple levels but worst at L3/4 on the right, L5/S1 bilaterally, and left L4/5.   NCS/EMG from 05/29/2023 was reviewed and showed multilevel radiculopathies affecting L2-4 and L5-S1 nerves     Patient name: Jesse [name] Patient MRN: [id number] Date of visit: 07/16/23

## 2023-07-18 ENCOUNTER — Telehealth: Payer: Self-pay | Admitting: Orthopedic Surgery

## 2023-07-18 MED ORDER — GABAPENTIN 100 MG PO CAPS: 200.0000 mg | ORAL_CAPSULE | Freq: Three times a day (TID) | ORAL | 0 refills | Status: DC

## 2023-07-18 NOTE — Addendum Note (Signed)
Addended by: Willia Craze on: 07/18/2023 07:08 PM   Modules accepted: Orders

## 2023-07-18 NOTE — Telephone Encounter (Signed)
Pt requesting Gabapentin refill sent to [facility] on file please advise

## 2023-07-31 ENCOUNTER — Ambulatory Visit: Payer: Medicare HMO | Admitting: Physical Medicine and Rehabilitation

## 2023-07-31 ENCOUNTER — Other Ambulatory Visit: Payer: Self-pay

## 2023-07-31 DIAGNOSIS — M5416 Radiculopathy, lumbar region: Secondary | ICD-10-CM | POA: Diagnosis not present

## 2023-07-31 MED ORDER — METHYLPREDNISOLONE ACETATE 40 MG/ML IJ SUSP
40.0000 mg | Freq: Once | INTRAMUSCULAR | Status: AC
  Administered 2023-07-31: 40 mg

## 2023-07-31 NOTE — Progress Notes (Signed)
Functional Pain Scale - descriptive words and definitions  Uncomfortable (3)  Pain is present but can complete all ADL's/sleep is slightly affected and passive distraction only gives marginal relief. Mild range order  Average Pain 3  127/78 +Driver, -BT, -Dye Allergies.

## 2023-07-31 NOTE — Procedures (Signed)
Lumbosacral Transforaminal Epidural Steroid Injection - Sub-Pedicular Approach with Fluoroscopic Guidance  Patient: Jesse [name]      Date of Birth: 03-Jul-1941 MRN: [id number] PCP: Georgann Housekeeper, MD      Visit Date: 07/31/2023   Universal Protocol:    Date/Time: 07/31/2023  Consent Given By: the patient  Position: PRONE  Additional Comments: Vital signs were monitored before and after the procedure. Patient was prepped and draped in the usual sterile fashion. The correct patient, procedure, and site was verified.   Injection Procedure Details:   Procedure diagnoses: Lumbar radiculopathy [M54.16]    Meds Administered:  Meds ordered this encounter  Medications   methylPREDNISolone acetate (DEPO-MEDROL) injection 40 mg    Laterality: Left  Location/Site: L4  Needle:5.0 in., 22 ga.  Short bevel or Quincke spinal needle  Needle Placement: Transforaminal  Findings:    -Comments: Excellent flow of contrast along the nerve, nerve root and into the epidural space.  Procedure Details: After squaring off the end-plates to get a true AP view, the C-arm was positioned so that an oblique view of the foramen as noted above was visualized. The target area is just inferior to the "nose of the scotty dog" or sub pedicular. The soft tissues overlying this structure were infiltrated with 2-3 ml. of 1% Lidocaine without Epinephrine.  The spinal needle was inserted toward the target using a "trajectory" view along the fluoroscope beam.  Under AP and lateral visualization, the needle was advanced so it did not puncture dura and was located close the 6 O'Clock position of the pedical in AP tracterory. Biplanar projections were used to confirm position. Aspiration was confirmed to be negative for CSF and/or blood. A 1-2 ml. volume of Isovue-250 was injected and flow of contrast was noted at each level. Radiographs were obtained for documentation purposes.   After attaining the desired flow of  contrast documented above, a 0.5 to 1.0 ml test dose of 0.25% Marcaine was injected into each respective transforaminal space.  The patient was observed for 90 seconds post injection.  After no sensory deficits were reported, and normal lower extremity motor function was noted,   the above injectate was administered so that equal amounts of the injectate were placed at each foramen (level) into the transforaminal epidural space.   Additional Comments:  No complications occurred Dressing: 2 x 2 sterile gauze and Band-Aid    Post-procedure details: Patient was observed during the procedure. Post-procedure instructions were reviewed.  Patient left the clinic in stable condition.

## 2023-07-31 NOTE — Progress Notes (Signed)
Jesse [name] - 82 y.o. male MRN [id number]  Date of birth: [birth year]  Office Visit Note: Visit Date: 07/31/2023 PCP: Georgann Housekeeper, MD Referred by: London Sheer, MD  Subjective: Chief Complaint  Patient presents with   Lower Back - Pain   HPI:  [name] is a 82 y.o. male who comes in today for planned repeat Left L3-4  Lumbar Transforaminal epidural steroid injection with fluoroscopic guidance.  The patient has failed conservative care including home exercise, medications, time and activity modification.  This injection will be diagnostic and hopefully therapeutic.  Please see requesting physician notes for further details and justification. Patient received more than 50% pain relief from prior injection.   Referring: Dr. Willia Craze   ROS Otherwise per HPI.  Assessment & Plan: Visit Diagnoses:    ICD-10-CM   1. Lumbar radiculopathy  M54.16 XR C-ARM NO REPORT    Epidural Steroid injection    methylPREDNISolone acetate (DEPO-MEDROL) injection 40 mg      Plan: No additional findings.   Meds & Orders:  Meds ordered this encounter  Medications   methylPREDNISolone acetate (DEPO-MEDROL) injection 40 mg    Orders Placed This Encounter  Procedures   XR C-ARM NO REPORT   Epidural Steroid injection    Follow-up: Return for visit to requesting provider as needed.   Procedures: No procedures performed  Lumbosacral Transforaminal Epidural Steroid Injection - Sub-Pedicular Approach with Fluoroscopic Guidance  Patient: [name]      Date of Birth: [birth year] MRN: [id number] PCP: Georgann Housekeeper, MD      Visit Date: 07/31/2023   Universal Protocol:    Date/Time: 07/31/2023  Consent Given By: the patient  Position: PRONE  Additional Comments: Vital signs were monitored before and after the procedure. Patient was prepped and draped in the usual sterile fashion. The correct patient, procedure, and site was verified.   Injection Procedure Details:    Procedure diagnoses: Lumbar radiculopathy [M54.16]    Meds Administered:  Meds ordered this encounter  Medications   methylPREDNISolone acetate (DEPO-MEDROL) injection 40 mg    Laterality: Left  Location/Site: L4  Needle:5.0 in., 22 ga.  Short bevel or Quincke spinal needle  Needle Placement: Transforaminal  Findings:    -Comments: Excellent flow of contrast along the nerve, nerve root and into the epidural space.  Procedure Details: After squaring off the end-plates to get a true AP view, the C-arm was positioned so that an oblique view of the foramen as noted above was visualized. The target area is just inferior to the "nose of the scotty dog" or sub pedicular. The soft tissues overlying this structure were infiltrated with 2-3 ml. of 1% Lidocaine without Epinephrine.  The spinal needle was inserted toward the target using a "trajectory" view along the fluoroscope beam.  Under AP and lateral visualization, the needle was advanced so it did not puncture dura and was located close the 6 O'Clock position of the pedical in AP tracterory. Biplanar projections were used to confirm position. Aspiration was confirmed to be negative for CSF and/or blood. A 1-2 ml. volume of Isovue-250 was injected and flow of contrast was noted at each level. Radiographs were obtained for documentation purposes.   After attaining the desired flow of contrast documented above, a 0.5 to 1.0 ml test dose of 0.25% Marcaine was injected into each respective transforaminal space.  The patient was observed for 90 seconds post injection.  After no sensory deficits were reported, and normal lower extremity  motor function was noted,   the above injectate was administered so that equal amounts of the injectate were placed at each foramen (level) into the transforaminal epidural space.   Additional Comments:  No complications occurred Dressing: 2 x 2 sterile gauze and Band-Aid    Post-procedure details: Patient  was observed during the procedure. Post-procedure instructions were reviewed.  Patient left the clinic in stable condition.    Clinical History: CLINICAL DATA:  Lumbar radiculopathy. Persistent pain. Lower back pain radiating into the both legs.   EXAM: MRI LUMBAR SPINE WITHOUT CONTRAST   TECHNIQUE: Multiplanar, multisequence MR imaging of the lumbar spine was performed. No intravenous contrast was administered.   COMPARISON:  Radiographs dated Feb 19, 2023   FINDINGS: Segmentation:   5 non rib-bearing lumbar type vertebral bodies are present. The lowest fully formed vertebral body is L5.   Alignment:  Levoscoliosis centered at L1-L2 disc space.   Vertebrae:  No fracture, evidence of discitis, or bone lesion.   Conus medullaris and cauda equina: Conus extends to the L1-L2 disc level. Conus and cauda equina appear normal.   Paraspinal and other soft tissues: Negative.   Disc levels:   T12-L1: Disc height loss and disc bulge with mild bilateral lateral recess stenosis. No significant neural foraminal stenosis. Mild-to-moderate bilateral facet joint arthropathy.   L1-L2: Disc height loss and right paracentral and extraforaminal disc bulge with severe right lateral recess stenosis. Moderate right neural foraminal stenosis. Mild left lateral recess stenosis. Mild-to-moderate bilateral facet joint arthropathy.   L2-L3: Disc height loss and circumferential disc bulge with severe bilateral lateral recess stenosis. Moderate right and mild left neural foraminal stenosis.   L3-L4: Disc height loss and left sided disc bulge with severe left lateral recess stenosis. Moderate right lateral recess stenosis. Mild right and moderate left neural foraminal stenosis. Moderate bilateral facet joint arthropathy.   L4-L5: Disc height loss and circumferential disc bulge. Ligamentum flavum hypertrophy with spinal canal stenosis. Moderate bilateral facet joint arthropathy. Severe  bilateral lateral recess stenosis. Moderate left neural foraminal stenosis   L5-S1: Disc height loss and circumferential disc bulge. Severe bilateral lateral recess stenosis. Moderate bilateral neural foraminal stenosis. Moderate bilateral facet joint arthropathy.   IMPRESSION: 1. Advanced multilevel degenerate disc disease with moderate levoscoliosis centered at L1-L2. 2. Severe right lateral recess stenosis at L1-L2. 3. Severe bilateral lateral recess stenosis at L2-L3. 4. Severe left lateral recess stenosis at L3-L4. 5. Severe bilateral lateral recess stenosis at L4-L5 and L5-S1. 6. Moderate right neural foraminal stenosis at L1-L2 and L2-L3. 7. Moderate left neural foraminal stenosis at L3-L4, L4-L5, and L5-S1.     Electronically Signed   By: Larose Hires D.O.   On: 04/02/2023 11:15     Objective:  VS:  HT:    WT:   BMI:     BP:   HR: bpm  TEMP: ( )  RESP:  Physical Exam Vitals and nursing note reviewed.  Constitutional:      General: He is not in acute distress.    Appearance: Normal appearance. He is not ill-appearing.  HENT:     Head: Normocephalic and atraumatic.     Right Ear: External ear normal.     Left Ear: External ear normal.     Nose: No congestion.  Eyes:     Extraocular Movements: Extraocular movements intact.  Cardiovascular:     Rate and Rhythm: Normal rate.     Pulses: Normal pulses.  Pulmonary:     Effort: Pulmonary  effort is normal. No respiratory distress.  Abdominal:     General: There is no distension.     Palpations: Abdomen is soft.  Musculoskeletal:        General: No tenderness or signs of injury.     Cervical back: Neck supple.     Right lower leg: No edema.     Left lower leg: No edema.     Comments: Patient has good distal strength without clonus.  Skin:    Findings: No erythema or rash.  Neurological:     General: No focal deficit present.     Mental Status: He is alert and oriented to person, place, and time.      Sensory: No sensory deficit.     Motor: No weakness or abnormal muscle tone.     Coordination: Coordination normal.  Psychiatric:        Mood and Affect: Mood normal.        Behavior: Behavior normal.      Imaging: XR C-ARM NO REPORT  Result Date: 07/31/2023 Please see Notes tab for imaging impression.

## 2023-07-31 NOTE — Patient Instructions (Signed)

## 2023-08-04 DIAGNOSIS — E538 Deficiency of other specified B group vitamins: Secondary | ICD-10-CM | POA: Diagnosis not present

## 2023-08-13 ENCOUNTER — Ambulatory Visit: Payer: Medicare HMO | Admitting: Orthopedic Surgery

## 2023-08-13 DIAGNOSIS — M5416 Radiculopathy, lumbar region: Secondary | ICD-10-CM | POA: Diagnosis not present

## 2023-08-13 MED ORDER — CELECOXIB 100 MG PO CAPS: 100.0000 mg | ORAL_CAPSULE | Freq: Two times a day (BID) | ORAL | 0 refills | Status: DC

## 2023-08-13 NOTE — Progress Notes (Signed)
Orthopedic Spine Surgery Office Note   Assessment: Patient is a 82 y.o. male with with chronic low back pain and sagittal imbalance (T1 pelvis angle 24, SVA of 7) and radicular left leg pain     Plan: -Patient has tried activity modification, naproxen, tylenol, injections, lyrica, gabapentin -He feels the naproxen is helpful but he has gotten light headed so he wanted to try a different anti-inflammatory. Celebrex was prescribed today.  He has noticed improvement in his radiating leg pain with the gabapentin.  He is currently on 200 mg 3 times daily.  Told him that he should increase it to 300 mg 3 times daily -Patient should return to office in 6 weeks, x-rays at next visit: none     Patient expressed understanding of the plan and all questions were answered to the patient's satisfaction.    ___________________________________________________________________________   History: Patient is a 82 y.o. male who has been previously seen in the office for symptoms of low back pain and left leg pain.  After last visit, patient got an injection with Dr. Alvester Morin.  He said he got about 30% relief.  He is still having pain in the leg.  He continues to feel that naproxen is helpful but he gets lightheaded after taking it.  He also has felt the gabapentin is helpful at the a slightly higher dose.  He is now taking 200 mg 3 times per day.  He has not noticed any new symptoms since he was last seen in the office.    Previous treatments: activity modification, naproxen, tylenol, injections, lyrica, gabapentin     Physical Exam:   General: no acute distress, appears stated age Neurologic: alert, answering questions appropriately, following commands Respiratory: unlabored breathing on room air, symmetric chest rise Psychiatric: appropriate affect, normal cadence to speech     MSK (spine):   -Strength exam                                                   Left                  Right EHL                               4/5                  4/5 TA                                 5/5                  5/5 GSC                             5/5                  5/5 Knee extension            5/5                  5/5 Hip flexion                    5/5  5/5   -Sensory exam                           Sensation intact to light touch in L3-S1 nerve distributions of bilateral lower extremities   -Straight leg raise: Negative bilaterally -Femoral nerve stretch test: Negative bilaterally -Clonus: no beats bilaterally   Imaging: XR of the lumbar spine from 02/19/2023 was previously independently reviewed and interpreted, showing PI-LL mismatch.  No fracture or dislocation seen.  Disc height loss at T12/L1, L1/L2, L2/L3, L5/S1.  There is anterior osteophyte formation seen at those levels as well.  Lumbar scoliosis with apex to the left at L2/3 disc space.   MRI of the lumbar spine from 03/28/2023 was previously independently reviewed and interpreted, showing DDD throughout the lumbar spine.  Lateral recess stenosis at L2/3.  Lateral recess stenosis at L4/5.  Central disc herniation and right lateral recess stenosis at L5/S1.  No significant central stenosis.  Foraminal stenosis at multiple levels but worst at L3/4 on the right, L5/S1 bilaterally, and left L4/5.   NCS/EMG from 05/29/2023 was reviewed and showed multilevel radiculopathies affecting L2-4 and L5-S1 nerves     Patient name: Jesse [name] Patient MRN: [id number] Date of visit: 08/13/23

## 2023-09-06 ENCOUNTER — Other Ambulatory Visit: Payer: Self-pay | Admitting: Orthopedic Surgery

## 2023-09-22 ENCOUNTER — Other Ambulatory Visit: Payer: Self-pay | Admitting: Orthopedic Surgery

## 2023-09-24 ENCOUNTER — Encounter: Payer: Self-pay | Admitting: Family

## 2023-09-25 ENCOUNTER — Ambulatory Visit: Payer: Medicare HMO | Admitting: Orthopedic Surgery

## 2023-10-01 ENCOUNTER — Inpatient Hospital Stay: Payer: Medicare Other | Admitting: Medical Oncology

## 2023-10-01 ENCOUNTER — Inpatient Hospital Stay: Payer: Medicare Other

## 2023-10-01 ENCOUNTER — Inpatient Hospital Stay: Payer: Medicare Other | Attending: Family

## 2023-10-01 ENCOUNTER — Encounter: Payer: Self-pay | Admitting: Medical Oncology

## 2023-10-01 VITALS — BP 119/78 | HR 86

## 2023-10-01 VITALS — BP 124/64 | HR 73 | Temp 97.5°F | Resp 19 | Ht 66.0 in | Wt 146.0 lb

## 2023-10-01 DIAGNOSIS — D500 Iron deficiency anemia secondary to blood loss (chronic): Secondary | ICD-10-CM

## 2023-10-01 DIAGNOSIS — D45 Polycythemia vera: Secondary | ICD-10-CM

## 2023-10-01 DIAGNOSIS — D751 Secondary polycythemia: Secondary | ICD-10-CM | POA: Diagnosis not present

## 2023-10-01 LAB — CMP (CANCER CENTER ONLY)
ALT: 16 U/L (ref 0–44)
AST: 15 U/L (ref 15–41)
Albumin: 4.1 g/dL (ref 3.5–5.0)
Alkaline Phosphatase: 54 U/L (ref 38–126)
Anion gap: 8 (ref 5–15)
BUN: 15 mg/dL (ref 8–23)
CO2: 28 mmol/L (ref 22–32)
Calcium: 10 mg/dL (ref 8.9–10.3)
Chloride: 95 mmol/L — ABNORMAL LOW (ref 98–111)
Creatinine: 0.7 mg/dL (ref 0.61–1.24)
GFR, Estimated: >60 mL/min (ref >60)
Glucose, Bld: 98 mg/dL (ref 70–99)
Potassium: 3.9 mmol/L (ref 3.5–5.1)
Sodium: 131 mmol/L — ABNORMAL LOW (ref 135–145)
Total Bilirubin: 1.6 mg/dL — ABNORMAL HIGH (ref 0.0–1.2)
Total Protein: 6.5 g/dL (ref 6.5–8.1)

## 2023-10-01 LAB — CBC WITH DIFFERENTIAL (CANCER CENTER ONLY)
Abs Immature Granulocytes: 0.03 10*3/uL (ref 0.00–0.07)
Basophils Absolute: 0 10*3/uL (ref 0.0–0.1)
Basophils Relative: 1 %
Eosinophils Absolute: 0.1 10*3/uL (ref 0.0–0.5)
Eosinophils Relative: 1 %
HCT: 50.7 % (ref 39.0–52.0)
Hemoglobin: 17.4 g/dL — ABNORMAL HIGH (ref 13.0–17.0)
Immature Granulocytes: 0 %
Lymphocytes Relative: 24 %
Lymphs Abs: 1.8 10*3/uL (ref 0.7–4.0)
MCH: 32.5 pg (ref 26.0–34.0)
MCHC: 34.3 g/dL (ref 30.0–36.0)
MCV: 94.8 fL (ref 80.0–100.0)
Monocytes Absolute: 0.9 10*3/uL (ref 0.1–1.0)
Monocytes Relative: 12 %
Neutro Abs: 4.4 10*3/uL (ref 1.7–7.7)
Neutrophils Relative %: 62 %
Platelet Count: 250 10*3/uL (ref 150–400)
RBC: 5.35 MIL/uL (ref 4.22–5.81)
RDW: 13.2 % (ref 11.5–15.5)
WBC Count: 7.2 10*3/uL (ref 4.0–10.5)
nRBC: 0 % (ref 0.0–0.2)

## 2023-10-01 MED ORDER — SODIUM CHLORIDE 0.9 % IV SOLN: INTRAVENOUS | Status: AC

## 2023-10-01 NOTE — Progress Notes (Signed)
 Hematology and Oncology Follow Up Visit  Jesse [name] [id number] [birth year] 83 y.o. 10/01/2023   Principle Diagnosis:  Polycythemia - JAK2 negative   Current Therapy:        Phlebotomy to maintain hematocrit below 45% Aspirin  81 mg by mouth daily   Interim History:  Jesse [name] is here today for follow-up.  Today he states that he is doing well overall  He continues to take oral B12. Thankfully his fatigue has improved with this supplementation.  No blood loss, bruising or petechiae.  No fever, chills, n/v, cough, rash, dizziness, SOB, chest pain, palpitations, abdominal pain or changes in bowel or bladder habits at this time.  Joint and lower back pain secondary to arthritis waxes and wanes.  No numbness or tingling in his extremities at this time.  No falls or syncope reported.  Appetite and hydration are good.  Wt Readings from Last 3 Encounters:  10/01/23 146 lb (66.2 kg)  07/02/23 146 lb (66.2 kg)  04/04/23 150 lb (68 kg)   ECOG Performance Status: 1 - Symptomatic but completely ambulatory  Medications:  Allergies as of 10/01/2023       Reactions   Hctz [hydrochlorothiazide ] Other (See Comments)   Hyponatremia ; Na+ 111, w weakness resulting in mechanical fall   Tramadol Nausea And Vomiting        Medication List        Accurate as of October 01, 2023 11:37 AM. If you have any questions, ask your nurse or doctor.          STOP taking these medications    Fluad Quadrivalent 0.5 ML injection Generic drug: influenza vaccine adjuvanted Stopped by: Lauraine CHRISTELLA Dais       TAKE these medications    ALPRAZolam  1 MG tablet Commonly known as: XANAX  Take 0.5 mg by mouth at bedtime.   celecoxib  100 MG capsule Commonly known as: CELEBREX  TAKE 1 CAPSULE BY MOUTH TWICE A DAY   Cholecalciferol  50 MCG (2000 UT) Caps Take 2,000 Units by mouth daily.   fish oil-omega-3 fatty acids 1000 MG capsule Take 1 g by mouth daily.   gabapentin  100 MG  capsule Commonly known as: NEURONTIN  Take 2 capsules (200 mg total) by mouth 3 (three) times daily.   lisinopril  20 MG tablet Commonly known as: ZESTRIL  Take 1 tablet (20 mg total) by mouth daily.   naproxen  500 MG tablet Commonly known as: NAPROSYN  TAKE 1 TABLET BY MOUTH TWICE A DAY WITH FOOD   simvastatin  20 MG tablet Commonly known as: ZOCOR  Take 1 tablet (20 mg total) by mouth daily at 6 PM.   tamsulosin  0.4 MG Caps capsule Commonly known as: FLOMAX  Take 0.4 mg by mouth daily.   vitamin B-12 100 MCG tablet Commonly known as: CYANOCOBALAMIN  Take 100 mcg by mouth daily.   vitamin E  1000 UNIT capsule Take 1,000 Units by mouth daily.        Allergies:  Allergies  Allergen Reactions   Hctz [Hydrochlorothiazide ] Other (See Comments)    Hyponatremia ; Na+ 111, w weakness resulting in mechanical fall   Tramadol Nausea And Vomiting    Past Medical History, Surgical history, Social history, and Family History were reviewed and updated.  Review of Systems: All other 10 point review of systems is negative.   Physical Exam:  height is 5' 6 (1.676 m) and weight is 146 lb (66.2 kg). His oral temperature is 97.5 F (36.4 C) (abnormal). His blood pressure is 124/64 and his pulse is  73. His respiration is 19 and oxygen saturation is 100%.   Wt Readings from Last 3 Encounters:  10/01/23 146 lb (66.2 kg)  07/02/23 146 lb (66.2 kg)  04/04/23 150 lb (68 kg)    Ocular: Sclerae unicteric, pupils equal, round and reactive to light Ear-nose-throat: Oropharynx clear, dentition fair Lymphatic: No cervical or supraclavicular adenopathy Lungs no rales or rhonchi, good excursion bilaterally Heart regular rate and rhythm, no murmur appreciated Abd soft, nontender, positive bowel sounds MSK no focal spinal tenderness, no joint edema Neuro: non-focal, well-oriented, appropriate affect   Lab Results  Component Value Date   WBC 7.2 10/01/2023   HGB 17.4 (H) 10/01/2023   HCT 50.7  10/01/2023   MCV 94.8 10/01/2023   PLT 250 10/01/2023   Lab Results  Component Value Date   FERRITIN 20 (L) 07/02/2023   IRON 135 07/02/2023   TIBC 403 07/02/2023   UIBC 268 07/02/2023   IRONPCTSAT 34 07/02/2023   Lab Results  Component Value Date   RETICCTPCT 1.6 12/11/2021   RBC 5.35 10/01/2023   RETICCTABS 87.5 01/02/2011   No results found for: KPAFRELGTCHN, LAMBDASER, KAPLAMBRATIO No results found for: KIMBERLY LE, IGMSERUM No results found for: STEPHANY CARLOTA BENSON MARKEL EARLA JOANNIE DOC, MSPIKE, SPEI   Chemistry      Component Value Date/Time   NA 131 (L) 10/01/2023 1053   NA 136 08/19/2017 1149   NA 135 (L) 10/21/2016 1151   K 3.9 10/01/2023 1053   K 3.8 08/19/2017 1149   K 3.9 10/21/2016 1151   CL 95 (L) 10/01/2023 1053   CL 95 (L) 08/19/2017 1149   CO2 28 10/01/2023 1053   CO2 28 08/19/2017 1149   CO2 25 10/21/2016 1151   BUN 15 10/01/2023 1053   BUN 14 08/19/2017 1149   BUN 13.6 10/21/2016 1151   CREATININE 0.70 10/01/2023 1053   CREATININE 1.0 08/19/2017 1149   CREATININE 0.8 10/21/2016 1151      Component Value Date/Time   CALCIUM  10.0 10/01/2023 1053   CALCIUM  10.1 08/19/2017 1149   CALCIUM  10.3 10/21/2016 1151   ALKPHOS 54 10/01/2023 1053   ALKPHOS 68 08/19/2017 1149   ALKPHOS 68 10/21/2016 1151   AST 15 10/01/2023 1053   AST 25 10/21/2016 1151   ALT 16 10/01/2023 1053   ALT 32 08/19/2017 1149   ALT 38 10/21/2016 1151   BILITOT 1.6 (H) 10/01/2023 1053   BILITOT 1.22 (H) 10/21/2016 1151      Encounter Diagnoses  Name Primary?   Iron deficiency anemia due to chronic blood loss Yes   Polycythemia, secondary     Impression and Plan: Jesse [name] is a very pleasant 83 yo caucasian gentleman with polycythemia, JAK-2 negative.   Phlebotomy is recommended today for Hct 50.7%. 500 ml IV replacement after given that he sometimes feels bad after his phlebotomy.   RTC 3 months APP, labs(CBC, CMP,  iron, ferritin) phlebotomy-[facility]  Yigit Norkus M Rylinn Linzy, PA-C 1/8/202511:37 AM

## 2023-10-01 NOTE — Patient Instructions (Signed)

## 2023-10-01 NOTE — Progress Notes (Signed)
 Jesse [name] presents today for phlebotomy per MD orders. Phlebotomy procedure started at 1140  and ended at 1200. 500 grams removed. Patient observed for 30 minutes after procedure without any incident. Patient tolerated procedure well. IV needle removed intact.

## 2023-10-13 ENCOUNTER — Ambulatory Visit: Payer: Medicare Other | Admitting: Orthopedic Surgery

## 2023-10-13 DIAGNOSIS — M5416 Radiculopathy, lumbar region: Secondary | ICD-10-CM

## 2023-10-13 MED ORDER — GABAPENTIN 100 MG PO CAPS: 200.0000 mg | ORAL_CAPSULE | Freq: Three times a day (TID) | ORAL | 0 refills | Status: DC

## 2023-10-13 NOTE — Progress Notes (Signed)
Orthopedic Spine Surgery Office Note   Assessment: Patient is a 83 y.o. male with with chronic low back pain and sagittal imbalance (T1 pelvis angle 24, SVA of 7) and radicular left leg pain     Plan: -Patient has tried activity modification, naproxen, tylenol, injections, lyrica, gabapentin -Last time, he said he got lightheaded with naproxen so he stopped it.  I prescribed him Celebrex but he said that was not working so he went back to naproxen.  He said he finds that helpful -He did not increase his gabapentin to TID as we talked about last time, so I told him to try that as a next step. Prescribed more gabapentin for him.  -Patient should return to office on an as needed basis     Patient expressed understanding of the plan and all questions were answered to the patient's satisfaction.    ___________________________________________________________________________   History: Patient is a 83 y.o. male who has been previously seen in the office for symptoms of low back pain and left leg pain.  He is still having low back pain and left lateral leg pain.  He said he is doing a little bit better since he was last seen.  He said the Celebrex was not helpful so he put himself back on 2 naproxen.  He does find the naproxen helpful.  He is continue to take gabapentin twice a day.  He did not increase it to the 3 times a day that was discussed previously.  He has not developed any new symptoms since he was in the office last time.     Previous treatments: activity modification, naproxen, tylenol, injections, lyrica, gabapentin     Physical Exam:   General: no acute distress, appears stated age Neurologic: alert, answering questions appropriately, following commands Respiratory: unlabored breathing on room air, symmetric chest rise Psychiatric: appropriate affect, normal cadence to speech     MSK (spine):   -Strength exam                                                   Left                   Right EHL                              4/5                  4/5 TA                                 5/5                  5/5 GSC                             5/5                  5/5 Knee extension            5/5                  5/5 Hip flexion  5/5                  5/5   -Sensory exam                           Sensation intact to light touch in L3-S1 nerve distributions of bilateral lower extremities   -Straight leg raise: Negative bilaterally -Femoral nerve stretch test: Negative bilaterally -Clonus: no beats bilaterally   Imaging: XR of the lumbar spine from 02/19/2023 was previously independently reviewed and interpreted, showing PI-LL mismatch.  No fracture or dislocation seen.  Disc height loss at T12/L1, L1/L2, L2/L3, L5/S1.  There is anterior osteophyte formation seen at those levels as well.  Lumbar scoliosis with apex to the left at L2/3 disc space.   MRI of the lumbar spine from 03/28/2023 was previously independently reviewed and interpreted, showing DDD throughout the lumbar spine.  Lateral recess stenosis at L2/3.  Lateral recess stenosis at L4/5.  Central disc herniation and right lateral recess stenosis at L5/S1.  No significant central stenosis.  Foraminal stenosis at multiple levels but worst at L3/4 on the right, L5/S1 bilaterally, and left L4/5.   NCS/EMG from 05/29/2023 was reviewed and showed multilevel radiculopathies affecting L2-4 and L5-S1 nerves     Patient name: Jesse [name] Patient MRN: [id number] Date of visit: 10/13/23

## 2023-11-04 ENCOUNTER — Other Ambulatory Visit: Payer: Self-pay | Admitting: Orthopedic Surgery

## 2023-11-05 ENCOUNTER — Other Ambulatory Visit: Payer: Self-pay | Admitting: Orthopedic Surgery

## 2023-11-17 ENCOUNTER — Other Ambulatory Visit: Payer: Self-pay | Admitting: Orthopedic Surgery

## 2023-12-02 DIAGNOSIS — I10 Essential (primary) hypertension: Secondary | ICD-10-CM | POA: Diagnosis not present

## 2023-12-02 DIAGNOSIS — E538 Deficiency of other specified B group vitamins: Secondary | ICD-10-CM | POA: Diagnosis not present

## 2023-12-02 DIAGNOSIS — D45 Polycythemia vera: Secondary | ICD-10-CM | POA: Diagnosis not present

## 2023-12-02 DIAGNOSIS — M519 Unspecified thoracic, thoracolumbar and lumbosacral intervertebral disc disorder: Secondary | ICD-10-CM | POA: Diagnosis not present

## 2023-12-02 DIAGNOSIS — E871 Hypo-osmolality and hyponatremia: Secondary | ICD-10-CM | POA: Diagnosis not present

## 2023-12-02 DIAGNOSIS — Q6100 Congenital renal cyst, unspecified: Secondary | ICD-10-CM | POA: Diagnosis not present

## 2023-12-02 DIAGNOSIS — E7800 Pure hypercholesterolemia, unspecified: Secondary | ICD-10-CM | POA: Diagnosis not present

## 2023-12-02 DIAGNOSIS — G629 Polyneuropathy, unspecified: Secondary | ICD-10-CM | POA: Diagnosis not present

## 2023-12-02 DIAGNOSIS — I700 Atherosclerosis of aorta: Secondary | ICD-10-CM | POA: Diagnosis not present

## 2023-12-18 ENCOUNTER — Other Ambulatory Visit: Payer: Self-pay | Admitting: Orthopedic Surgery

## 2023-12-30 ENCOUNTER — Ambulatory Visit: Payer: Medicare Other | Admitting: Family

## 2023-12-30 ENCOUNTER — Inpatient Hospital Stay: Payer: Medicare Other | Attending: Family

## 2023-12-30 ENCOUNTER — Inpatient Hospital Stay: Payer: Medicare Other | Admitting: Family

## 2023-12-30 ENCOUNTER — Other Ambulatory Visit: Payer: Medicare Other

## 2023-12-30 ENCOUNTER — Inpatient Hospital Stay: Payer: Medicare Other

## 2023-12-30 ENCOUNTER — Encounter: Payer: Self-pay | Admitting: Family

## 2023-12-30 VITALS — BP 111/74 | HR 90 | Resp 18

## 2023-12-30 VITALS — BP 116/65 | HR 80 | Temp 97.6°F | Resp 19 | Ht 66.0 in | Wt 145.0 lb

## 2023-12-30 DIAGNOSIS — D751 Secondary polycythemia: Secondary | ICD-10-CM | POA: Diagnosis not present

## 2023-12-30 DIAGNOSIS — D500 Iron deficiency anemia secondary to blood loss (chronic): Secondary | ICD-10-CM | POA: Diagnosis not present

## 2023-12-30 DIAGNOSIS — D45 Polycythemia vera: Secondary | ICD-10-CM

## 2023-12-30 LAB — IRON AND IRON BINDING CAPACITY (CC-WL,HP ONLY)
Iron: 157 ug/dL (ref 45–182)
Saturation Ratios: 42 % — ABNORMAL HIGH (ref 17.9–39.5)
TIBC: 378 ug/dL (ref 250–450)
UIBC: 221 ug/dL (ref 117–376)

## 2023-12-30 LAB — CMP (CANCER CENTER ONLY)
ALT: 14 U/L (ref 0–44)
AST: 13 U/L — ABNORMAL LOW (ref 15–41)
Albumin: 4 g/dL (ref 3.5–5.0)
Alkaline Phosphatase: 61 U/L (ref 38–126)
Anion gap: 8 (ref 5–15)
BUN: 20 mg/dL (ref 8–23)
CO2: 29 mmol/L (ref 22–32)
Calcium: 10.1 mg/dL (ref 8.9–10.3)
Chloride: 97 mmol/L — ABNORMAL LOW (ref 98–111)
Creatinine: 0.84 mg/dL (ref 0.61–1.24)
GFR, Estimated: >60 mL/min (ref >60)
Glucose, Bld: 130 mg/dL — ABNORMAL HIGH (ref 70–99)
Potassium: 3.9 mmol/L (ref 3.5–5.1)
Sodium: 134 mmol/L — ABNORMAL LOW (ref 135–145)
Total Bilirubin: 1.3 mg/dL — ABNORMAL HIGH (ref 0.0–1.2)
Total Protein: 6.3 g/dL — ABNORMAL LOW (ref 6.5–8.1)

## 2023-12-30 LAB — CBC
HCT: 49.5 % (ref 39.0–52.0)
Hemoglobin: 16.9 g/dL (ref 13.0–17.0)
MCH: 32.6 pg (ref 26.0–34.0)
MCHC: 34.1 g/dL (ref 30.0–36.0)
MCV: 95.4 fL (ref 80.0–100.0)
Platelets: 239 10*3/uL (ref 150–400)
RBC: 5.19 MIL/uL (ref 4.22–5.81)
RDW: 13.6 % (ref 11.5–15.5)
WBC: 7.1 10*3/uL (ref 4.0–10.5)
nRBC: 0 % (ref 0.0–0.2)

## 2023-12-30 LAB — FERRITIN: Ferritin: 24 ng/mL (ref 24–336)

## 2023-12-30 NOTE — Progress Notes (Signed)
 Jesse [name] presents today for phlebotomy per MD orders. Phlebotomy procedure started at 1332 and ended at 1345. 530 grams removed via 16 gauge to left AC using phlebotomy kit. Patient observed for 15 minutes after procedure without any incident. Pt declined to stay full observation period stating he has had procedure prior without difficulty. Pt given snacks and drinks during observation period. Patient tolerated procedure well. IV needle removed intact.

## 2023-12-30 NOTE — Progress Notes (Signed)
 Hematology and Oncology Follow Up Visit  Jesse [name] [id number] 12-17-1940 83 y.o. 12/30/2023   Principle Diagnosis:  Polycythemia - JAK2 negative   Current Therapy:        Phlebotomy to maintain hematocrit below 45% Aspirin 81 mg by mouth daily   Interim History:  Jesse [name] is here today for follow-up and phlebotomy. He is doing well over all but does have some mild fatigue at times.  Hct today is 49.5%.  He denies any fever, chills, n/v, cough, rash, dizziness, SOB, chest pain, palpitations, abdominal pain or changes in bowel or bladder habits.  No swelling in his extremities.  Neuropathy in his feet is unchanged from baseline. He is receiving steroid injections in the lower back and states that he is avoiding back surgery.  No falls or syncope reported.  Appetite is fair and he admits that he needs to hydrate better throughout the day.  Weight is stable at 145 lbs.   ECOG Performance Status: 1 - Symptomatic but completely ambulatory  Medications:  Allergies as of 12/30/2023       Reactions   Hctz [hydrochlorothiazide] Other (See Comments)   Hyponatremia ; Na+ 111, w weakness resulting in mechanical fall   Tramadol Nausea And Vomiting        Medication List        Accurate as of December 30, 2023  1:11 PM. If you have any questions, ask your nurse or doctor.          STOP taking these medications    lisinopril 20 MG tablet Commonly known as: ZESTRIL Stopped by: Eileen Stanford       TAKE these medications    ALPRAZolam 1 MG tablet Commonly known as: XANAX Take 0.5 mg by mouth at bedtime.   celecoxib 100 MG capsule Commonly known as: CELEBREX TAKE 1 CAPSULE BY MOUTH TWICE A DAY   Cholecalciferol 50 MCG (2000 UT) Caps Take 2,000 Units by mouth daily.   fish oil-omega-3 fatty acids 1000 MG capsule Take 1 g by mouth daily.   gabapentin 100 MG capsule Commonly known as: NEURONTIN Take 2 capsules (200 mg total) by mouth 3 (three) times daily.    lisinopril-hydrochlorothiazide 20-12.5 MG tablet Commonly known as: ZESTORETIC Take 1 tablet by mouth daily.   simvastatin 20 MG tablet Commonly known as: ZOCOR Take 1 tablet (20 mg total) by mouth daily at 6 PM.   tamsulosin 0.4 MG Caps capsule Commonly known as: FLOMAX Take 0.4 mg by mouth daily.   vitamin B-12 100 MCG tablet Commonly known as: CYANOCOBALAMIN Take 100 mcg by mouth daily.   vitamin E 1000 UNIT capsule Take 1,000 Units by mouth daily.        Allergies:  Allergies  Allergen Reactions   Hctz [Hydrochlorothiazide] Other (See Comments)    Hyponatremia ; Na+ 111, w weakness resulting in mechanical fall   Tramadol Nausea And Vomiting    Past Medical History, Surgical history, Social history, and Family History were reviewed and updated.  Review of Systems: All other 10 point review of systems is negative.   Physical Exam:  height is 5\' 6"  (1.676 m) and weight is 145 lb (65.8 kg). His oral temperature is 97.6 F (36.4 C). His blood pressure is 116/65 and his pulse is 80. His respiration is 19 and oxygen saturation is 97%.   Wt Readings from Last 3 Encounters:  12/30/23 145 lb (65.8 kg)  10/01/23 146 lb (66.2 kg)  07/02/23 146 lb (66.2 kg)  Ocular: Sclerae unicteric, pupils equal, round and reactive to light Ear-nose-throat: Oropharynx clear, dentition fair Lymphatic: No cervical or supraclavicular adenopathy Lungs no rales or rhonchi, good excursion bilaterally Heart regular rate and rhythm, no murmur appreciated Abd soft, nontender, positive bowel sounds MSK no focal spinal tenderness, no joint edema Neuro: non-focal, well-oriented, appropriate affect Breasts: Deferred   Lab Results  Component Value Date   WBC 7.1 12/30/2023   HGB 16.9 12/30/2023   HCT 49.5 12/30/2023   MCV 95.4 12/30/2023   PLT 239 12/30/2023   Lab Results  Component Value Date   FERRITIN 20 (L) 07/02/2023   IRON 135 07/02/2023   TIBC 403 07/02/2023   UIBC 268  07/02/2023   IRONPCTSAT 34 07/02/2023   Lab Results  Component Value Date   RETICCTPCT 1.6 12/11/2021   RBC 5.19 12/30/2023   RETICCTABS 87.5 01/02/2011   No results found for: "KPAFRELGTCHN", "LAMBDASER", "KAPLAMBRATIO" No results found for: "IGGSERUM", "IGA", "IGMSERUM" No results found for: "TOTALPROTELP", "ALBUMINELP", "A1GS", "A2GS", "BETS", "BETA2SER", "GAMS", "MSPIKE", "SPEI"   Chemistry      Component Value Date/Time   NA 131 (L) 10/01/2023 1053   NA 136 08/19/2017 1149   NA 135 (L) 10/21/2016 1151   K 3.9 10/01/2023 1053   K 3.8 08/19/2017 1149   K 3.9 10/21/2016 1151   CL 95 (L) 10/01/2023 1053   CL 95 (L) 08/19/2017 1149   CO2 28 10/01/2023 1053   CO2 28 08/19/2017 1149   CO2 25 10/21/2016 1151   BUN 15 10/01/2023 1053   BUN 14 08/19/2017 1149   BUN 13.6 10/21/2016 1151   CREATININE 0.70 10/01/2023 1053   CREATININE 1.0 08/19/2017 1149   CREATININE 0.8 10/21/2016 1151      Component Value Date/Time   CALCIUM 10.0 10/01/2023 1053   CALCIUM 10.1 08/19/2017 1149   CALCIUM 10.3 10/21/2016 1151   ALKPHOS 54 10/01/2023 1053   ALKPHOS 68 08/19/2017 1149   ALKPHOS 68 10/21/2016 1151   AST 15 10/01/2023 1053   AST 25 10/21/2016 1151   ALT 16 10/01/2023 1053   ALT 32 08/19/2017 1149   ALT 38 10/21/2016 1151   BILITOT 1.6 (H) 10/01/2023 1053   BILITOT 1.22 (H) 10/21/2016 1151       Impression and Plan: Jesse [name] is a very pleasant 83 yo caucasian gentleman with polycythemia, JAK-2 negative.   We will proceed with phlebotomy today as planned. He does not want fluids today.  Follow-up in 3 months.   Eileen Stanford, NP 4/8/20251:11 PM

## 2023-12-30 NOTE — Patient Instructions (Signed)

## 2024-01-14 ENCOUNTER — Other Ambulatory Visit: Payer: Self-pay | Admitting: Orthopedic Surgery

## 2024-02-03 DIAGNOSIS — H31003 Unspecified chorioretinal scars, bilateral: Secondary | ICD-10-CM | POA: Diagnosis not present

## 2024-03-01 DIAGNOSIS — H57813 Brow ptosis, bilateral: Secondary | ICD-10-CM | POA: Diagnosis not present

## 2024-03-01 DIAGNOSIS — H26491 Other secondary cataract, right eye: Secondary | ICD-10-CM | POA: Diagnosis not present

## 2024-03-01 DIAGNOSIS — Z961 Presence of intraocular lens: Secondary | ICD-10-CM | POA: Diagnosis not present

## 2024-03-01 DIAGNOSIS — H40013 Open angle with borderline findings, low risk, bilateral: Secondary | ICD-10-CM | POA: Diagnosis not present

## 2024-03-08 DIAGNOSIS — Z961 Presence of intraocular lens: Secondary | ICD-10-CM | POA: Diagnosis not present

## 2024-03-15 ENCOUNTER — Other Ambulatory Visit: Payer: Self-pay | Admitting: Orthopedic Surgery

## 2024-04-06 ENCOUNTER — Encounter: Payer: Self-pay | Admitting: Hematology & Oncology

## 2024-04-06 ENCOUNTER — Inpatient Hospital Stay: Admitting: Hematology & Oncology

## 2024-04-06 ENCOUNTER — Inpatient Hospital Stay

## 2024-04-06 ENCOUNTER — Inpatient Hospital Stay: Attending: Family

## 2024-04-06 VITALS — BP 133/85 | HR 78 | Temp 97.6°F | Resp 20 | Ht 66.0 in | Wt 146.0 lb

## 2024-04-06 VITALS — BP 122/81 | HR 90

## 2024-04-06 DIAGNOSIS — D45 Polycythemia vera: Secondary | ICD-10-CM

## 2024-04-06 DIAGNOSIS — D500 Iron deficiency anemia secondary to blood loss (chronic): Secondary | ICD-10-CM

## 2024-04-06 DIAGNOSIS — D751 Secondary polycythemia: Secondary | ICD-10-CM | POA: Insufficient documentation

## 2024-04-06 LAB — CMP (CANCER CENTER ONLY)
ALT: 13 U/L (ref 0–44)
AST: 13 U/L — ABNORMAL LOW (ref 15–41)
Albumin: 4.1 g/dL (ref 3.5–5.0)
Alkaline Phosphatase: 59 U/L (ref 38–126)
Anion gap: 7 (ref 5–15)
BUN: 17 mg/dL (ref 8–23)
CO2: 27 mmol/L (ref 22–32)
Calcium: 10.5 mg/dL — ABNORMAL HIGH (ref 8.9–10.3)
Chloride: 102 mmol/L (ref 98–111)
Creatinine: 0.82 mg/dL (ref 0.61–1.24)
GFR, Estimated: >60 mL/min (ref >60)
Glucose, Bld: 105 mg/dL — ABNORMAL HIGH (ref 70–99)
Potassium: 4.8 mmol/L (ref 3.5–5.1)
Sodium: 136 mmol/L (ref 135–145)
Total Bilirubin: 1.6 mg/dL — ABNORMAL HIGH (ref 0.0–1.2)
Total Protein: 6.7 g/dL (ref 6.5–8.1)

## 2024-04-06 LAB — CBC WITH DIFFERENTIAL (CANCER CENTER ONLY)
Abs Immature Granulocytes: 0.04 K/uL (ref 0.00–0.07)
Basophils Absolute: 0 K/uL (ref 0.0–0.1)
Basophils Relative: 1 %
Eosinophils Absolute: 0.1 K/uL (ref 0.0–0.5)
Eosinophils Relative: 2 %
HCT: 52.1 % — ABNORMAL HIGH (ref 39.0–52.0)
Hemoglobin: 17.2 g/dL — ABNORMAL HIGH (ref 13.0–17.0)
Immature Granulocytes: 1 %
Lymphocytes Relative: 26 %
Lymphs Abs: 1.6 K/uL (ref 0.7–4.0)
MCH: 31.4 pg (ref 26.0–34.0)
MCHC: 33 g/dL (ref 30.0–36.0)
MCV: 95.2 fL (ref 80.0–100.0)
Monocytes Absolute: 0.8 K/uL (ref 0.1–1.0)
Monocytes Relative: 12 %
Neutro Abs: 3.6 K/uL (ref 1.7–7.7)
Neutrophils Relative %: 58 %
Platelet Count: 223 K/uL (ref 150–400)
RBC: 5.47 MIL/uL (ref 4.22–5.81)
RDW: 13.8 % (ref 11.5–15.5)
WBC Count: 6.1 K/uL (ref 4.0–10.5)
nRBC: 0 % (ref 0.0–0.2)

## 2024-04-06 LAB — IRON AND IRON BINDING CAPACITY (CC-WL,HP ONLY)
Iron: 145 ug/dL (ref 45–182)
Saturation Ratios: 37 % (ref 17.9–39.5)
TIBC: 395 ug/dL (ref 250–450)
UIBC: 250 ug/dL

## 2024-04-06 LAB — FERRITIN: Ferritin: 38 ng/mL (ref 24–336)

## 2024-04-06 MED ORDER — SODIUM CHLORIDE 0.9% FLUSH: 10.0000 mL | Freq: Once | INTRAVENOUS | Status: DC

## 2024-04-06 NOTE — Progress Notes (Signed)
 Hematology and Oncology Follow Up Visit  Jesse [name] [id number] Jan 01, 1941 83 y.o. 04/06/2024   Principle Diagnosis:  Polycythemia - JAK2 negative   Current Therapy:        Phlebotomy to maintain hematocrit below 45% Aspirin  81 mg by mouth daily   Interim History:  Jesse [name] is here today for follow-up and phlebotomy.  Unfortunately, he is having a lot of problems with his back.  He has spinal claudication.  He has an MRI that was done on 03/28/2023.  This shows severe spinal stenosis in the lumbar spine at multiple levels..  I think he sees his doctor for this in a week or so.  I am unsure what can be done with similar levels having problems.  He does not have any problems with weakness.  He is having some pain.  The pain is worse when he walks.  The pain is also worse in the morning.  As far as the polycythemia, this really is not a problem for him.  He does need to be phlebotomized today.  His hematocrit is 52.1%.  He has had no issues with nausea or vomiting.  He has had no change in bowel or bladder habits.  He has had no bleeding.  Has had no rashes.  I just feel bad that he has this neuropathy secondary to the spinal stenosis.  Overall, I would say his performance status is probably ECOG 1.    Medications:  Allergies as of 04/06/2024       Reactions   Hctz [hydrochlorothiazide ] Other (See Comments)   Hyponatremia ; Na+ 111, w weakness resulting in mechanical fall   Tramadol Nausea And Vomiting        Medication List        Accurate as of April 06, 2024 12:07 PM. If you have any questions, ask your nurse or doctor.          ALPRAZolam  1 MG tablet Commonly known as: XANAX  Take 0.5 mg by mouth at bedtime.   celecoxib  100 MG capsule Commonly known as: CELEBREX  TAKE 1 CAPSULE BY MOUTH TWICE A DAY   Cholecalciferol  50 MCG (2000 UT) Caps Take 2,000 Units by mouth daily.   fish oil-omega-3 fatty acids 1000 MG capsule Take 1 g by mouth daily.   gabapentin   100 MG capsule Commonly known as: NEURONTIN  Take 2 capsules (200 mg total) by mouth 3 (three) times daily.   lisinopril -hydrochlorothiazide  20-12.5 MG tablet Commonly known as: ZESTORETIC  Take 1 tablet by mouth daily.   naproxen  500 MG tablet Commonly known as: NAPROSYN  TAKE 1 TABLET BY MOUTH TWICE A DAY WITH FOOD   simvastatin  20 MG tablet Commonly known as: ZOCOR  Take 1 tablet (20 mg total) by mouth daily at 6 PM.   tamsulosin 0.4 MG Caps capsule Commonly known as: FLOMAX Take 0.4 mg by mouth daily.   vitamin B-12 100 MCG tablet Commonly known as: CYANOCOBALAMIN  Take 100 mcg by mouth daily.   vitamin E  1000 UNIT capsule Take 1,000 Units by mouth daily.        Allergies:  Allergies  Allergen Reactions   Hctz [Hydrochlorothiazide ] Other (See Comments)    Hyponatremia ; Na+ 111, w weakness resulting in mechanical fall   Tramadol Nausea And Vomiting    Past Medical History, Surgical history, Social history, and Family History were reviewed and updated.  Review of Systems: Review of Systems  Constitutional: Negative.   HENT: Negative.    Eyes: Negative.   Respiratory: Negative.  Cardiovascular: Negative.   Gastrointestinal: Negative.   Genitourinary: Negative.   Musculoskeletal:  Positive for back pain.  Skin: Negative.   Neurological:  Positive for sensory change.  Endo/Heme/Allergies: Negative.   Psychiatric/Behavioral: Negative.       Physical Exam:  height is 5' 6 (1.676 m) and weight is 146 lb (66.2 kg). His oral temperature is 97.6 F (36.4 C). His blood pressure is 133/85 and his pulse is 78. His respiration is 20 and oxygen saturation is 100%.   Wt Readings from Last 3 Encounters:  04/06/24 146 lb (66.2 kg)  12/30/23 145 lb (65.8 kg)  10/01/23 146 lb (66.2 kg)    Physical Exam Vitals reviewed.  HENT:     Head: Normocephalic and atraumatic.  Eyes:     Pupils: Pupils are equal, round, and reactive to light.  Cardiovascular:     Rate and  Rhythm: Normal rate and regular rhythm.     Heart sounds: Normal heart sounds.  Pulmonary:     Effort: Pulmonary effort is normal.     Breath sounds: Normal breath sounds.  Abdominal:     General: Bowel sounds are normal.     Palpations: Abdomen is soft.  Musculoskeletal:        General: No tenderness or deformity. Normal range of motion.     Cervical back: Normal range of motion.  Lymphadenopathy:     Cervical: No cervical adenopathy.  Skin:    General: Skin is warm and dry.     Findings: No erythema or rash.  Neurological:     Mental Status: He is alert and oriented to person, place, and time.  Psychiatric:        Behavior: Behavior normal.        Thought Content: Thought content normal.        Judgment: Judgment normal.      Lab Results  Component Value Date   WBC 6.1 04/06/2024   HGB 17.2 (H) 04/06/2024   HCT 52.1 (H) 04/06/2024   MCV 95.2 04/06/2024   PLT 223 04/06/2024   Lab Results  Component Value Date   FERRITIN 24 12/30/2023   IRON 157 12/30/2023   TIBC 378 12/30/2023   UIBC 221 12/30/2023   IRONPCTSAT 42 (H) 12/30/2023   Lab Results  Component Value Date   RETICCTPCT 1.6 12/11/2021   RBC 5.47 04/06/2024   RETICCTABS 87.5 01/02/2011   No results found for: KPAFRELGTCHN, LAMBDASER, KAPLAMBRATIO No results found for: IGGSERUM, IGA, IGMSERUM No results found for: STEPHANY CARLOTA BENSON MARKEL EARLA JOANNIE DOC VICK, SPEI   Chemistry      Component Value Date/Time   NA 136 04/06/2024 1103   NA 136 08/19/2017 1149   NA 135 (L) 10/21/2016 1151   K 4.8 04/06/2024 1103   K 3.8 08/19/2017 1149   K 3.9 10/21/2016 1151   CL 102 04/06/2024 1103   CL 95 (L) 08/19/2017 1149   CO2 27 04/06/2024 1103   CO2 28 08/19/2017 1149   CO2 25 10/21/2016 1151   BUN 17 04/06/2024 1103   BUN 14 08/19/2017 1149   BUN 13.6 10/21/2016 1151   CREATININE 0.82 04/06/2024 1103   CREATININE 1.0 08/19/2017 1149   CREATININE 0.8  10/21/2016 1151      Component Value Date/Time   CALCIUM 10.5 (H) 04/06/2024 1103   CALCIUM 10.1 08/19/2017 1149   CALCIUM 10.3 10/21/2016 1151   ALKPHOS 59 04/06/2024 1103   ALKPHOS 68 08/19/2017 1149   ALKPHOS 68 10/21/2016  1151   AST 13 (L) 04/06/2024 1103   AST 25 10/21/2016 1151   ALT 13 04/06/2024 1103   ALT 32 08/19/2017 1149   ALT 38 10/21/2016 1151   BILITOT 1.6 (H) 04/06/2024 1103   BILITOT 1.22 (H) 10/21/2016 1151       Impression and Plan: Mr. Bramel is a very pleasant 83 yo caucasian gentleman with polycythemia, JAK-2 negative.   Right now, his problem is that his spinal stenosis.  Again, if you need surgery, from my point of view, I do not see a problem with him having this done.  Again we will phlebotomize him today.  Again I do not see a problem with him having surgery for his back if necessary.  We will plan to get him back here in 3 months.   Maude JONELLE Crease, MD 7/15/202512:07 PM

## 2024-04-06 NOTE — Progress Notes (Signed)
 Jesse [name] presents today for phlebotomy per MD orders. Phlebotomy procedure started at 1224  and ended at 1235. 500 grams removed. Patient observed for 30 minutes after procedure without any incident. Patient tolerated procedure well. IV needle removed intact.

## 2024-04-06 NOTE — Progress Notes (Signed)
 Error in charting.

## 2024-04-10 ENCOUNTER — Inpatient Hospital Stay (HOSPITAL_COMMUNITY)
Admission: EM | Admit: 2024-04-10 | Discharge: 2024-04-23 | DRG: 234 | Disposition: A | Discharge status: Skilled Nursing Facility | Attending: Thoracic Surgery (Cardiothoracic Vascular Surgery) | Admitting: Thoracic Surgery (Cardiothoracic Vascular Surgery)

## 2024-04-10 ENCOUNTER — Inpatient Hospital Stay (HOSPITAL_COMMUNITY)

## 2024-04-10 ENCOUNTER — Other Ambulatory Visit: Payer: Self-pay

## 2024-04-10 ENCOUNTER — Encounter (HOSPITAL_COMMUNITY)
Admission: EM | Disposition: A | Discharge status: Skilled Nursing Facility | Payer: Self-pay | Source: Home / Self Care | Attending: Thoracic Surgery (Cardiothoracic Vascular Surgery)

## 2024-04-10 ENCOUNTER — Encounter (HOSPITAL_COMMUNITY): Payer: Self-pay

## 2024-04-10 DIAGNOSIS — I214 Non-ST elevation (NSTEMI) myocardial infarction: Secondary | ICD-10-CM | POA: Diagnosis present

## 2024-04-10 DIAGNOSIS — I2510 Atherosclerotic heart disease of native coronary artery without angina pectoris: Secondary | ICD-10-CM | POA: Diagnosis present

## 2024-04-10 DIAGNOSIS — Z951 Presence of aortocoronary bypass graft: Secondary | ICD-10-CM | POA: Diagnosis not present

## 2024-04-10 DIAGNOSIS — Z4682 Encounter for fitting and adjustment of non-vascular catheter: Secondary | ICD-10-CM | POA: Diagnosis not present

## 2024-04-10 DIAGNOSIS — D62 Acute posthemorrhagic anemia: Secondary | ICD-10-CM | POA: Diagnosis not present

## 2024-04-10 DIAGNOSIS — R0602 Shortness of breath: Secondary | ICD-10-CM | POA: Diagnosis not present

## 2024-04-10 DIAGNOSIS — I219 Acute myocardial infarction, unspecified: Secondary | ICD-10-CM | POA: Diagnosis not present

## 2024-04-10 DIAGNOSIS — R2681 Unsteadiness on feet: Secondary | ICD-10-CM | POA: Diagnosis not present

## 2024-04-10 DIAGNOSIS — J9811 Atelectasis: Secondary | ICD-10-CM | POA: Diagnosis not present

## 2024-04-10 DIAGNOSIS — R9431 Abnormal electrocardiogram [ECG] [EKG]: Secondary | ICD-10-CM

## 2024-04-10 DIAGNOSIS — I517 Cardiomegaly: Secondary | ICD-10-CM | POA: Diagnosis not present

## 2024-04-10 DIAGNOSIS — I213 ST elevation (STEMI) myocardial infarction of unspecified site: Secondary | ICD-10-CM | POA: Diagnosis not present

## 2024-04-10 DIAGNOSIS — E785 Hyperlipidemia, unspecified: Secondary | ICD-10-CM | POA: Diagnosis not present

## 2024-04-10 DIAGNOSIS — Z79899 Other long term (current) drug therapy: Secondary | ICD-10-CM | POA: Diagnosis not present

## 2024-04-10 DIAGNOSIS — E7800 Pure hypercholesterolemia, unspecified: Secondary | ICD-10-CM | POA: Diagnosis present

## 2024-04-10 DIAGNOSIS — Z8249 Family history of ischemic heart disease and other diseases of the circulatory system: Secondary | ICD-10-CM | POA: Diagnosis not present

## 2024-04-10 DIAGNOSIS — Z888 Allergy status to other drugs, medicaments and biological substances status: Secondary | ICD-10-CM

## 2024-04-10 DIAGNOSIS — R0689 Other abnormalities of breathing: Secondary | ICD-10-CM | POA: Diagnosis not present

## 2024-04-10 DIAGNOSIS — I951 Orthostatic hypotension: Secondary | ICD-10-CM | POA: Diagnosis not present

## 2024-04-10 DIAGNOSIS — D45 Polycythemia vera: Secondary | ICD-10-CM | POA: Diagnosis not present

## 2024-04-10 DIAGNOSIS — I5032 Chronic diastolic (congestive) heart failure: Secondary | ICD-10-CM | POA: Diagnosis present

## 2024-04-10 DIAGNOSIS — R079 Chest pain, unspecified: Secondary | ICD-10-CM | POA: Diagnosis not present

## 2024-04-10 DIAGNOSIS — E871 Hypo-osmolality and hyponatremia: Secondary | ICD-10-CM | POA: Diagnosis not present

## 2024-04-10 DIAGNOSIS — Z6821 Body mass index (BMI) 21.0-21.9, adult: Secondary | ICD-10-CM | POA: Diagnosis not present

## 2024-04-10 DIAGNOSIS — I110 Hypertensive heart disease with heart failure: Secondary | ICD-10-CM | POA: Diagnosis present

## 2024-04-10 DIAGNOSIS — Z7982 Long term (current) use of aspirin: Secondary | ICD-10-CM | POA: Diagnosis not present

## 2024-04-10 DIAGNOSIS — Z7401 Bed confinement status: Secondary | ICD-10-CM | POA: Diagnosis not present

## 2024-04-10 DIAGNOSIS — J90 Pleural effusion, not elsewhere classified: Secondary | ICD-10-CM | POA: Diagnosis not present

## 2024-04-10 DIAGNOSIS — R000 Tachycardia, unspecified: Secondary | ICD-10-CM | POA: Diagnosis not present

## 2024-04-10 DIAGNOSIS — F32A Depression, unspecified: Secondary | ICD-10-CM | POA: Diagnosis present

## 2024-04-10 DIAGNOSIS — I21A9 Other myocardial infarction type: Principal | ICD-10-CM

## 2024-04-10 DIAGNOSIS — R54 Age-related physical debility: Secondary | ICD-10-CM | POA: Diagnosis present

## 2024-04-10 DIAGNOSIS — E876 Hypokalemia: Secondary | ICD-10-CM | POA: Diagnosis not present

## 2024-04-10 DIAGNOSIS — I10 Essential (primary) hypertension: Secondary | ICD-10-CM | POA: Diagnosis not present

## 2024-04-10 DIAGNOSIS — I222 Subsequent non-ST elevation (NSTEMI) myocardial infarction: Secondary | ICD-10-CM | POA: Diagnosis not present

## 2024-04-10 DIAGNOSIS — Z885 Allergy status to narcotic agent status: Secondary | ICD-10-CM | POA: Diagnosis not present

## 2024-04-10 DIAGNOSIS — I081 Rheumatic disorders of both mitral and tricuspid valves: Secondary | ICD-10-CM | POA: Diagnosis not present

## 2024-04-10 DIAGNOSIS — R918 Other nonspecific abnormal finding of lung field: Secondary | ICD-10-CM | POA: Diagnosis not present

## 2024-04-10 DIAGNOSIS — Z743 Need for continuous supervision: Secondary | ICD-10-CM | POA: Diagnosis not present

## 2024-04-10 DIAGNOSIS — I2109 ST elevation (STEMI) myocardial infarction involving other coronary artery of anterior wall: Secondary | ICD-10-CM | POA: Diagnosis not present

## 2024-04-10 DIAGNOSIS — R002 Palpitations: Secondary | ICD-10-CM | POA: Diagnosis not present

## 2024-04-10 DIAGNOSIS — I2489 Other forms of acute ischemic heart disease: Secondary | ICD-10-CM | POA: Diagnosis not present

## 2024-04-10 DIAGNOSIS — R0989 Other specified symptoms and signs involving the circulatory and respiratory systems: Secondary | ICD-10-CM | POA: Diagnosis not present

## 2024-04-10 DIAGNOSIS — Z452 Encounter for adjustment and management of vascular access device: Secondary | ICD-10-CM | POA: Diagnosis not present

## 2024-04-10 DIAGNOSIS — Z48812 Encounter for surgical aftercare following surgery on the circulatory system: Secondary | ICD-10-CM | POA: Diagnosis not present

## 2024-04-10 DIAGNOSIS — M6281 Muscle weakness (generalized): Secondary | ICD-10-CM | POA: Diagnosis not present

## 2024-04-10 HISTORY — PX: LEFT HEART CATH AND CORONARY ANGIOGRAPHY: CATH118249

## 2024-04-10 HISTORY — PX: CORONARY/GRAFT ACUTE MI REVASCULARIZATION: CATH118305

## 2024-04-10 LAB — LIPID PANEL
Cholesterol: 129 mg/dL (ref 0–200)
HDL: 36 mg/dL — ABNORMAL LOW (ref >40)
LDL Cholesterol: 83 mg/dL (ref 0–99)
Total CHOL/HDL Ratio: 3.6 ratio
Triglycerides: 51 mg/dL (ref <150)
VLDL: 10 mg/dL (ref 0–40)

## 2024-04-10 LAB — COMPREHENSIVE METABOLIC PANEL WITH GFR
ALT: 14 U/L (ref 0–44)
AST: 17 U/L (ref 15–41)
Albumin: 2.8 g/dL — ABNORMAL LOW (ref 3.5–5.0)
Alkaline Phosphatase: 44 U/L (ref 38–126)
Anion gap: 9 (ref 5–15)
BUN: 14 mg/dL (ref 8–23)
CO2: 19 mmol/L — ABNORMAL LOW (ref 22–32)
Calcium: 8.8 mg/dL — ABNORMAL LOW (ref 8.9–10.3)
Chloride: 104 mmol/L (ref 98–111)
Creatinine, Ser: 0.75 mg/dL (ref 0.61–1.24)
GFR, Estimated: >60 mL/min (ref >60)
Glucose, Bld: 146 mg/dL — ABNORMAL HIGH (ref 70–99)
Potassium: 3.3 mmol/L — ABNORMAL LOW (ref 3.5–5.1)
Sodium: 132 mmol/L — ABNORMAL LOW (ref 135–145)
Total Bilirubin: 1.3 mg/dL — ABNORMAL HIGH (ref 0.0–1.2)
Total Protein: 5.1 g/dL — ABNORMAL LOW (ref 6.5–8.1)

## 2024-04-10 LAB — CBC WITH DIFFERENTIAL/PLATELET
Abs Immature Granulocytes: 0.04 K/uL (ref 0.00–0.07)
Basophils Absolute: 0 K/uL (ref 0.0–0.1)
Basophils Relative: 0 %
Eosinophils Absolute: 0 K/uL (ref 0.0–0.5)
Eosinophils Relative: 1 %
HCT: 38 % — ABNORMAL LOW (ref 39.0–52.0)
Hemoglobin: 13.1 g/dL (ref 13.0–17.0)
Immature Granulocytes: 1 %
Lymphocytes Relative: 20 %
Lymphs Abs: 1.5 K/uL (ref 0.7–4.0)
MCH: 32.2 pg (ref 26.0–34.0)
MCHC: 34.5 g/dL (ref 30.0–36.0)
MCV: 93.4 fL (ref 80.0–100.0)
Monocytes Absolute: 0.5 K/uL (ref 0.1–1.0)
Monocytes Relative: 7 %
Neutro Abs: 5.2 K/uL (ref 1.7–7.7)
Neutrophils Relative %: 71 %
Platelets: 213 K/uL (ref 150–400)
RBC: 4.07 MIL/uL — ABNORMAL LOW (ref 4.22–5.81)
RDW: 13.6 % (ref 11.5–15.5)
WBC: 7.2 K/uL (ref 4.0–10.5)
nRBC: 0 % (ref 0.0–0.2)

## 2024-04-10 LAB — PREPARE RBC (CROSSMATCH)

## 2024-04-10 LAB — ECHOCARDIOGRAM COMPLETE
AR max vel: 2.33 cm2
AV Peak grad: 5.1 mmHg
Ao pk vel: 1.13 m/s
Area-P 1/2: 3.17 cm2
S' Lateral: 2.9 cm

## 2024-04-10 LAB — HEMOGLOBIN A1C
Hgb A1c MFr Bld: 4.9 % (ref 4.8–5.6)
Mean Plasma Glucose: 93.93 mg/dL

## 2024-04-10 LAB — ABO/RH: ABO/RH(D): O NEG

## 2024-04-10 LAB — TROPONIN I (HIGH SENSITIVITY)
Troponin I (High Sensitivity): 136 ng/L (ref <18)
Troponin I (High Sensitivity): 420 ng/L (ref <18)

## 2024-04-10 LAB — MRSA NEXT GEN BY PCR, NASAL: MRSA by PCR Next Gen: NOT DETECTED

## 2024-04-10 LAB — TSH: TSH: 1.335 u[IU]/mL (ref 0.350–4.500)

## 2024-04-10 LAB — MAGNESIUM: Magnesium: 1.8 mg/dL (ref 1.7–2.4)

## 2024-04-10 SURGERY — CORONARY/GRAFT ACUTE MI REVASCULARIZATION
Anesthesia: LOCAL

## 2024-04-10 MED ORDER — NITROGLYCERIN IN D5W 200-5 MCG/ML-% IV SOLN
2.0000 ug/min | INTRAVENOUS | Status: DC
  Filled 2024-04-10: qty 250

## 2024-04-10 MED ORDER — TIROFIBAN HCL IN NACL 5-0.9 MG/100ML-% IV SOLN
0.1500 ug/kg/min | INTRAVENOUS | Status: DC
  Administered 2024-04-10 – 2024-04-11 (×2): 0.15 ug/kg/min via INTRAVENOUS
  Filled 2024-04-10 (×2): qty 100

## 2024-04-10 MED ORDER — ATORVASTATIN CALCIUM 80 MG PO TABS
80.0000 mg | ORAL_TABLET | Freq: Every day | ORAL | Status: DC
  Administered 2024-04-10 – 2024-04-23 (×14): 80 mg via ORAL
  Filled 2024-04-10 (×14): qty 1

## 2024-04-10 MED ORDER — MIDAZOLAM HCL 2 MG/2ML IJ SOLN
INTRAMUSCULAR | Status: AC
  Filled 2024-04-10: qty 2

## 2024-04-10 MED ORDER — TIROFIBAN (AGGRASTAT) BOLUS VIA INFUSION
INTRAVENOUS | Status: DC | PRN
  Administered 2024-04-10: 1655 ug via INTRAVENOUS

## 2024-04-10 MED ORDER — ASPIRIN 81 MG PO CHEW
81.0000 mg | CHEWABLE_TABLET | Freq: Every day | ORAL | Status: DC
  Administered 2024-04-11: 81 mg via ORAL
  Filled 2024-04-10: qty 1

## 2024-04-10 MED ORDER — SODIUM CHLORIDE 0.9 % IV SOLN
250.0000 mL | INTRAVENOUS | Status: DC | PRN
  Administered 2024-04-10: 250 mL via INTRAVENOUS

## 2024-04-10 MED ORDER — CHLORHEXIDINE GLUCONATE CLOTH 2 % EX PADS
6.0000 | MEDICATED_PAD | Freq: Once | CUTANEOUS | Status: AC
  Administered 2024-04-10: 6 via TOPICAL

## 2024-04-10 MED ORDER — DEXMEDETOMIDINE HCL IN NACL 400 MCG/100ML IV SOLN
0.1000 ug/kg/h | INTRAVENOUS | Status: AC
  Administered 2024-04-11: .5 ug/kg/h via INTRAVENOUS
  Filled 2024-04-10: qty 100

## 2024-04-10 MED ORDER — BISACODYL 5 MG PO TBEC
5.0000 mg | DELAYED_RELEASE_TABLET | Freq: Once | ORAL | Status: AC
  Administered 2024-04-10: 5 mg via ORAL
  Filled 2024-04-10: qty 1

## 2024-04-10 MED ORDER — ORAL CARE MOUTH RINSE
15.0000 mL | OROMUCOSAL | Status: DC | PRN
Start: 2024-04-10 — End: 2024-04-11

## 2024-04-10 MED ORDER — TRANEXAMIC ACID (OHS) PUMP PRIME SOLUTION
2.0000 mg/kg | INTRAVENOUS | Status: DC
  Filled 2024-04-10: qty 1.32

## 2024-04-10 MED ORDER — VERAPAMIL HCL 2.5 MG/ML IV SOLN
INTRAVENOUS | Status: DC | PRN
  Administered 2024-04-10: 10 mL via INTRA_ARTERIAL

## 2024-04-10 MED ORDER — PLASMA-LYTE A IV SOLN
INTRAVENOUS | Status: DC
  Filled 2024-04-10: qty 2.5

## 2024-04-10 MED ORDER — CHLORHEXIDINE GLUCONATE 0.12 % MT SOLN
15.0000 mL | Freq: Once | OROMUCOSAL | Status: AC
  Administered 2024-04-11: 15 mL via OROMUCOSAL
  Filled 2024-04-10: qty 15

## 2024-04-10 MED ORDER — POTASSIUM CHLORIDE CRYS ER 10 MEQ PO TBCR
40.0000 meq | EXTENDED_RELEASE_TABLET | ORAL | Status: AC
  Administered 2024-04-10 (×2): 40 meq via ORAL
  Filled 2024-04-10: qty 4
  Filled 2024-04-10: qty 2

## 2024-04-10 MED ORDER — HEPARIN 30,000 UNITS/1000 ML (OHS) CELLSAVER SOLUTION
Status: DC
  Filled 2024-04-10: qty 1000

## 2024-04-10 MED ORDER — MILRINONE LACTATE IN DEXTROSE 20-5 MG/100ML-% IV SOLN
0.3000 ug/kg/min | INTRAVENOUS | Status: DC
  Filled 2024-04-10: qty 100

## 2024-04-10 MED ORDER — MAGNESIUM SULFATE 2 GM/50ML IV SOLN
2.0000 g | Freq: Once | INTRAVENOUS | Status: AC
  Administered 2024-04-10: 2 g via INTRAVENOUS
  Filled 2024-04-10: qty 50

## 2024-04-10 MED ORDER — HEPARIN SODIUM (PORCINE) 1000 UNIT/ML IJ SOLN
INTRAMUSCULAR | Status: AC
Start: 2024-04-10 — End: 2024-04-10
  Filled 2024-04-10: qty 10

## 2024-04-10 MED ORDER — HEPARIN (PORCINE) 25000 UT/250ML-% IV SOLN: 800.0000 [IU]/h | INTRAVENOUS | Status: DC

## 2024-04-10 MED ORDER — VERAPAMIL HCL 2.5 MG/ML IV SOLN
INTRAVENOUS | Status: AC
  Filled 2024-04-10: qty 2

## 2024-04-10 MED ORDER — INSULIN REGULAR(HUMAN) IN NACL 100-0.9 UT/100ML-% IV SOLN
INTRAVENOUS | Status: AC
  Administered 2024-04-11: 1.3 [IU]/h via INTRAVENOUS
  Filled 2024-04-10: qty 100

## 2024-04-10 MED ORDER — SODIUM CHLORIDE 0.9% FLUSH: 3.0000 mL | INTRAVENOUS | Status: DC | PRN

## 2024-04-10 MED ORDER — TEMAZEPAM 15 MG PO CAPS
15.0000 mg | ORAL_CAPSULE | Freq: Once | ORAL | Status: AC | PRN
  Administered 2024-04-10: 15 mg via ORAL
  Filled 2024-04-10: qty 1

## 2024-04-10 MED ORDER — NOREPINEPHRINE 4 MG/250ML-% IV SOLN
0.0000 ug/min | INTRAVENOUS | Status: AC
  Administered 2024-04-11: 2 ug/min via INTRAVENOUS
  Filled 2024-04-10: qty 250

## 2024-04-10 MED ORDER — MAGNESIUM SULFATE 50 % IJ SOLN
40.0000 meq | INTRAMUSCULAR | Status: DC
  Filled 2024-04-10: qty 9.85

## 2024-04-10 MED ORDER — PHENYLEPHRINE HCL-NACL 20-0.9 MG/250ML-% IV SOLN
30.0000 ug/min | INTRAVENOUS | Status: AC
  Administered 2024-04-11: 30 ug/min via INTRAVENOUS
  Filled 2024-04-10: qty 250

## 2024-04-10 MED ORDER — MIDAZOLAM HCL 2 MG/2ML IJ SOLN
INTRAMUSCULAR | Status: DC | PRN
  Administered 2024-04-10: 1 mg via INTRAVENOUS

## 2024-04-10 MED ORDER — SODIUM CHLORIDE 0.9 % IV SOLN
INTRAVENOUS | Status: AC | PRN
  Administered 2024-04-10: 10 mL/h via INTRAVENOUS

## 2024-04-10 MED ORDER — LIDOCAINE HCL (PF) 1 % IJ SOLN
INTRAMUSCULAR | Status: DC | PRN
  Administered 2024-04-10: 2 mL

## 2024-04-10 MED ORDER — HEPARIN SODIUM (PORCINE) 5000 UNIT/ML IJ SOLN
4000.0000 [IU] | Freq: Once | INTRAMUSCULAR | Status: AC
  Administered 2024-04-10: 4000 [IU] via INTRAVENOUS

## 2024-04-10 MED ORDER — ONDANSETRON HCL 4 MG/2ML IJ SOLN: 4.0000 mg | Freq: Four times a day (QID) | INTRAMUSCULAR | Status: DC | PRN

## 2024-04-10 MED ORDER — VANCOMYCIN HCL 1250 MG/250ML IV SOLN
1250.0000 mg | INTRAVENOUS | Status: AC
  Administered 2024-04-11: 1250 mg via INTRAVENOUS
  Filled 2024-04-10: qty 250

## 2024-04-10 MED ORDER — CEFAZOLIN SODIUM-DEXTROSE 2-4 GM/100ML-% IV SOLN
2.0000 g | INTRAVENOUS | Status: AC
  Administered 2024-04-11: 2 g via INTRAVENOUS
  Filled 2024-04-10: qty 100

## 2024-04-10 MED ORDER — MANNITOL 20 % IV SOLN
INTRAVENOUS | Status: DC
  Filled 2024-04-10 (×2): qty 13

## 2024-04-10 MED ORDER — FENTANYL CITRATE (PF) 100 MCG/2ML IJ SOLN
INTRAMUSCULAR | Status: AC
  Filled 2024-04-10: qty 2

## 2024-04-10 MED ORDER — ACETAMINOPHEN 325 MG PO TABS
650.0000 mg | ORAL_TABLET | ORAL | Status: DC | PRN
Start: 2024-04-10 — End: 2024-04-11

## 2024-04-10 MED ORDER — TIROFIBAN HCL IN NACL 5-0.9 MG/100ML-% IV SOLN
INTRAVENOUS | Status: AC | PRN
  Administered 2024-04-10: .15 ug/kg/min via INTRAVENOUS

## 2024-04-10 MED ORDER — METOPROLOL TARTRATE 12.5 MG HALF TABLET
12.5000 mg | ORAL_TABLET | Freq: Once | ORAL | Status: AC
  Administered 2024-04-11: 12.5 mg via ORAL
  Filled 2024-04-10: qty 1

## 2024-04-10 MED ORDER — LIDOCAINE HCL (PF) 1 % IJ SOLN
INTRAMUSCULAR | Status: AC
  Filled 2024-04-10: qty 30

## 2024-04-10 MED ORDER — TRANEXAMIC ACID 1000 MG/10ML IV SOLN
1.5000 mg/kg/h | INTRAVENOUS | Status: AC
  Administered 2024-04-11: 1.5 mg/kg/h via INTRAVENOUS
  Filled 2024-04-10: qty 25

## 2024-04-10 MED ORDER — HEPARIN (PORCINE) IN NACL 1000-0.9 UT/500ML-% IV SOLN
INTRAVENOUS | Status: DC | PRN
  Administered 2024-04-10 (×2): 500 mL

## 2024-04-10 MED ORDER — POTASSIUM CHLORIDE 2 MEQ/ML IV SOLN
80.0000 meq | INTRAVENOUS | Status: DC
  Filled 2024-04-10: qty 40

## 2024-04-10 MED ORDER — TRANEXAMIC ACID (OHS) BOLUS VIA INFUSION
15.0000 mg/kg | INTRAVENOUS | Status: AC
  Administered 2024-04-11: 662 mg via INTRAVENOUS
  Filled 2024-04-10: qty 993

## 2024-04-10 MED ORDER — HEPARIN SODIUM (PORCINE) 1000 UNIT/ML IJ SOLN
INTRAMUSCULAR | Status: DC | PRN
  Administered 2024-04-10: 8000 [IU] via INTRAVENOUS

## 2024-04-10 MED ORDER — IOHEXOL 350 MG/ML SOLN
INTRAVENOUS | Status: DC | PRN
  Administered 2024-04-10: 50 mL via INTRA_ARTERIAL

## 2024-04-10 MED ORDER — EPINEPHRINE HCL 5 MG/250ML IV SOLN IN NS
0.0000 ug/min | INTRAVENOUS | Status: DC
  Filled 2024-04-10: qty 250

## 2024-04-10 MED ORDER — SODIUM CHLORIDE 0.9% FLUSH
3.0000 mL | Freq: Two times a day (BID) | INTRAVENOUS | Status: DC
  Administered 2024-04-10 (×2): 3 mL via INTRAVENOUS

## 2024-04-10 MED ORDER — FENTANYL CITRATE (PF) 100 MCG/2ML IJ SOLN
INTRAMUSCULAR | Status: DC | PRN
  Administered 2024-04-10: 25 ug via INTRAVENOUS

## 2024-04-10 MED ORDER — CHLORHEXIDINE GLUCONATE CLOTH 2 % EX PADS
6.0000 | MEDICATED_PAD | Freq: Once | CUTANEOUS | Status: AC
  Administered 2024-04-11: 6 via TOPICAL

## 2024-04-10 MED ORDER — TIROFIBAN HCL IN NACL 5-0.9 MG/100ML-% IV SOLN
INTRAVENOUS | Status: AC
Start: 2024-04-10 — End: 2024-04-10
  Filled 2024-04-10: qty 100

## 2024-04-10 SURGICAL SUPPLY — 8 items
CATH INFINITI AMBI 5FR JK (CATHETERS) IMPLANT
DEVICE RAD COMP TR BAND LRG (VASCULAR PRODUCTS) IMPLANT
GLIDESHEATH SLEND SS 6F .021 (SHEATH) IMPLANT
GUIDEWIRE INQWIRE 1.5J.035X260 (WIRE) IMPLANT
KIT ENCORE 26 ADVANTAGE (KITS) IMPLANT
KIT SYRINGE INJ CVI SPIKEX1 (MISCELLANEOUS) IMPLANT
PACK CARDIAC CATHETERIZATION (CUSTOM PROCEDURE TRAY) ×1 IMPLANT
SET ATX-X65L (MISCELLANEOUS) IMPLANT

## 2024-04-10 NOTE — ED Triage Notes (Signed)
 Pt to ED via [facility] from home. Pt had CP last night, took a ntg, resolved. Pt started having CP, SHOB this am, took ASA 324mg  at home. EMS gave ntg x 2, 18g LAC. Pts pain reduced from 9/10 to 4/10 after ntg.   EMS EKG depression, with elevation, code stemi called by Dr. Patsey. 110/50 HR 110 98% RA

## 2024-04-10 NOTE — H&P (Signed)
 Cardiology Admission History and Physical   Patient ID: Jesse [name] MRN: [id number]; DOB: Oct 10, 1940   Admission date: 04/10/2024  PCP:  Ransom Other, MD   [facility] Providers Cardiologist:  Gordy Bergamo, MD   new, was Dr Claudene years ago    Chief Complaint: Abnormal ECG with ongoing chest pain  Patient Profile: Jesse [name] is a 83 y.o. male with hx HTN, HLD, depression, neuritis, polycythemia vera with phlebotomy last on 7/15, who is being seen 04/10/2024 for the evaluation of possible STEMI.  History of Present Illness: Jesse [name] had an episode of chest pain last p.m. that started after he ate.  At that time, he took aspirin  and rested and the symptoms resolved.  This a.m., he ate breakfast about 1030 and again had chest pain after eating.  He called EMS.  He was given 4 baby aspirin  to chew, and sublingual nitroglycerin  x 2 by EMS.  His chest pain improved from a 10/10 to a 4/10.  Currently it is a 5/10.  The pain was associated with some diaphoresis and some shortness of breath.  He felt a little queasy but was not truly nauseated.  His ECG was significantly abnormal.  He was brought to the emergency room by EMS and taken to the Cath Lab.   Past Medical History:  Diagnosis Date   Depression    Hypercholesteremia    Hypertension    HZ (herpes zoster) 09/02/2011   Neuritis    left foot   Polycythemia rubra vera (HCC) 09/02/2011   Past Surgical History:  Procedure Laterality Date   GAS INSERTION  06/04/2012   Procedure: INSERTION OF GAS;  Surgeon: Norleen JONETTA Ku, MD;  Location: [facility] OR;  Service: Ophthalmology;  Laterality: Left;  [id number]   SCLERAL BUCKLE  06/04/2012   Procedure: SCLERAL BUCKLE;  Surgeon: Norleen JONETTA Ku, MD;  Location: [facility] OR;  Service: Ophthalmology;  Laterality: Left;  Headscope laser     Medications Prior to Admission: Prior to Admission medications   Medication Sig Start Date End Date Taking? Authorizing Provider  ALPRAZolam  (XANAX ) 1 MG  tablet Take 0.5 mg by mouth at bedtime. 03/13/21   [provider]  celecoxib  (CELEBREX ) 100 MG capsule TAKE 1 CAPSULE BY MOUTH TWICE A DAY Patient not taking: Reported on 04/06/2024 12/18/23   Georgina Ozell LABOR, MD  Cholecalciferol  50 MCG (2000 UT) CAPS Take 2,000 Units by mouth daily.    [provider]  fish oil-omega-3 fatty acids 1000 MG capsule Take 1 g by mouth daily.    [provider]  gabapentin  (NEURONTIN ) 100 MG capsule Take 2 capsules (200 mg total) by mouth 3 (three) times daily. 10/13/23 04/06/24  Georgina Ozell LABOR, MD  lisinopril -hydrochlorothiazide  (ZESTORETIC ) 20-12.5 MG tablet Take 1 tablet by mouth daily.    [provider]  naproxen  (NAPROSYN ) 500 MG tablet TAKE 1 TABLET BY MOUTH TWICE A DAY WITH FOOD 03/15/24   Georgina Ozell LABOR, MD  simvastatin  (ZOCOR ) 20 MG tablet Take 1 tablet (20 mg total) by mouth daily at 6 PM. 03/05/21   Medina-Vargas, Monina C, NP  tamsulosin  (FLOMAX ) 0.4 MG CAPS capsule Take 0.4 mg by mouth daily. 03/26/21   [provider]  vitamin B-12 (CYANOCOBALAMIN ) 100 MCG tablet Take 100 mcg by mouth daily.    [provider]  vitamin E  1000 UNIT capsule Take 1,000 Units by mouth daily. 11/18/19   [provider]     Allergies:    Allergies  Allergen  Reactions   Hctz [Hydrochlorothiazide ] Other (See Comments)    Hyponatremia ; Na+ 111, w weakness resulting in mechanical fall   Tramadol Nausea And Vomiting    Social History:   Social History   Socioeconomic History   Marital status: Single    Spouse name: Not on file   Number of children: Not on file   Years of education: Not on file   Highest education level: Not on file  Occupational History    Comment: retired  Tobacco Use   Smoking status: Never   Smokeless tobacco: Never   Tobacco comments:    never used tobacco  Vaping Use   Vaping status: Never Used  Substance and Sexual Activity   Alcohol use: No    Alcohol/week: 0.0 standard  drinks of alcohol   Drug use: No   Sexual activity: Not Currently  Other Topics Concern   Not on file  Social History Narrative   Not on file   Social Drivers of Health   Financial Resource Strain: Not on file  Food Insecurity: Not on file  Transportation Needs: Not on file  Physical Activity: Not on file  Stress: Not on file  Social Connections: Not on file  Intimate Partner Violence: Not on file     Family History:   The patient's family history includes Heart attack in his mother.    ROS:  Please see the history of present illness. All other ROS reviewed and negative.     Physical Exam/Data: Vitals:   04/10/24 1215 04/10/24 1217 04/10/24 1227 04/10/24 1242  BP: 123/81  125/82   Pulse: (!) 103  100   Resp: 20  (!) 22   Temp:  98.5 F (36.9 C)    TempSrc:  Temporal    SpO2: 100%  100% 99%   No intake or output data in the 24 hours ending 04/10/24 1308    04/06/2024   11:44 AM 12/30/2023    1:06 PM 10/01/2023   11:20 AM  Last 3 Weights  Weight (lbs) 146 lb 145 lb 146 lb  Weight (kg) 66.225 kg 65.772 kg 66.225 kg     There is no height or weight on file to calculate BMI.  General:  Well nourished, well developed, in acute distress HEENT: normal Neck: no JVD Vascular: No carotid bruits; Distal pulses 2+ bilaterally   Cardiac:  normal S1, S2; RRR; no murmur  Lungs:  clear to auscultation bilaterally, no wheezing, rhonchi or rales  Abd: soft, nontender, no hepatomegaly  Ext: no edema Musculoskeletal:  No deformities, BUE and BLE strength normal and equal Skin: warm and dry  Neuro:  CNs 2-12 intact, no focal abnormalities noted Psych:  Normal affect   EKG:  The ECG that was done 07/19 was personally reviewed and demonstrates  EMS ECG is sinus rhythm, heart rate 97, diffuse ST depression inferolaterally and ST elevation in aVL ER ECG is sinus rhythm, heart rate 105, ST depressions have improved, but there is still ST elevation in aVL  Relevant CV Studies: None in  the system  Laboratory Data: High Sensitivity Troponin:  No results for input(s): TROPONINIHS in the last 720 hours.    Chemistry Recent Labs  Lab 04/06/24 1103  NA 136  K 4.8  CL 102  CO2 27  GLUCOSE 105*  BUN 17  CREATININE 0.82  CALCIUM  10.5*  GFRNONAA >60  ANIONGAP 7    Recent Labs  Lab 04/06/24 1103  PROT 6.7  ALBUMIN  4.1  AST  13*  ALT 13  ALKPHOS 59  BILITOT 1.6*   Lipids No results for input(s): CHOL, TRIG, HDL, LABVLDL, LDLCALC, CHOLHDL in the last 168 hours. Hematology Recent Labs  Lab 04/06/24 1103  WBC 6.1  RBC 5.47  HGB 17.2*  HCT 52.1*  MCV 95.2  MCH 31.4  MCHC 33.0  RDW 13.8  PLT 223   Thyroid  No results for input(s): TSH, FREET4 in the last 168 hours. BNPNo results for input(s): BNP, PROBNP in the last 168 hours.  DDimer No results for input(s): DDIMER in the last 168 hours. Lab Results  Component Value Date   FERRITIN 38 04/06/2024   Lab Results  Component Value Date   IRON 145 04/06/2024   TIBC 395 04/06/2024   FERRITIN 38 04/06/2024      Radiology/Studies:  No results found.   Assessment and Plan: NSTEMI - Although ST elevation is only seen in aVR, his initial ECG was significantly abnormal and he is having ongoing pain despite sublingual nitroglycerin  x 2 and ASA - He is being taken emergently to the Cath Lab with further evaluation and treatment depending on the results - We will follow his troponins, check blood counts, check a lipid profile, TSH, magnesium  and hemoglobin A1c. - He will be started on high-dose statin  2.  Polycythemia vera - Last phlebotomy was on 7/15, at that time, ferritin and iron profile were within normal limits and hemoglobin was 17.2 - Follow labs  3.  Hyperlipidemia - he has been taking omega-3 fish oil and Zocor  20 mg daily. - Start Lipitor 80 mg daily  4.  Hypertension: -Prior to admission he was on lisinopril  HCTZ 20/12.5 mg daily - Hold this for now   Risk  Assessment/Risk Scores:   TIMI Risk Score for ST  Elevation MI:   The patient's TIMI risk score is 6, which indicates a 16.1% risk of all cause mortality at 30 days.    For questions or updates, please contact [facility] Please consult [email or web address] for contact info under     Signed, Shona Shad, PA-C  04/10/2024 1:08 PM

## 2024-04-10 NOTE — ED Notes (Signed)
Cardiology PA at bedside. 

## 2024-04-10 NOTE — Progress Notes (Signed)
 PHARMACY - ANTICOAGULATION CONSULT NOTE  Pharmacy Consult for heparin  Indication: chest pain/ACS  Allergies  Allergen Reactions   Hctz [Hydrochlorothiazide ] Other (See Comments)    Hyponatremia ; Na+ 111, w weakness resulting in mechanical fall   Tramadol Nausea And Vomiting    Patient Measurements:    Vital Signs: Temp: 98.5 F (36.9 C) (07/19 1217) Temp Source: Temporal (07/19 1217) BP: 104/63 (07/19 1400) Pulse Rate: 76 (07/19 1400)  Labs: Recent Labs    04/10/24 1255  HGB 13.1  HCT 38.0*  PLT 213    Estimated Creatinine Clearance: 63.8 mL/min (by C-G formula based on SCr of 0.82 mg/dL).   Medical History: Past Medical History:  Diagnosis Date   Depression    Hypercholesteremia    Hypertension    HZ (herpes zoster) 09/02/2011   Neuritis    left foot   Polycythemia rubra vera (HCC) 09/02/2011    Medications:  Medications Prior to Admission  Medication Sig Dispense Refill Last Dose/Taking   ALPRAZolam  (XANAX ) 1 MG tablet Take 0.5 mg by mouth at bedtime.      celecoxib  (CELEBREX ) 100 MG capsule TAKE 1 CAPSULE BY MOUTH TWICE A DAY (Patient not taking: Reported on 04/06/2024) 84 capsule 0    Cholecalciferol  50 MCG (2000 UT) CAPS Take 2,000 Units by mouth daily.      fish oil-omega-3 fatty acids 1000 MG capsule Take 1 g by mouth daily.      gabapentin  (NEURONTIN ) 100 MG capsule Take 2 capsules (200 mg total) by mouth 3 (three) times daily. 180 capsule 0    lisinopril -hydrochlorothiazide  (ZESTORETIC ) 20-12.5 MG tablet Take 1 tablet by mouth daily.      naproxen  (NAPROSYN ) 500 MG tablet TAKE 1 TABLET BY MOUTH TWICE A DAY WITH FOOD 60 tablet 1    simvastatin  (ZOCOR ) 20 MG tablet Take 1 tablet (20 mg total) by mouth daily at 6 PM. 30 tablet 0    tamsulosin  (FLOMAX ) 0.4 MG CAPS capsule Take 0.4 mg by mouth daily.      vitamin B-12 (CYANOCOBALAMIN ) 100 MCG tablet Take 100 mcg by mouth daily.      vitamin E  1000 UNIT capsule Take 1,000 Units by mouth daily.       Scheduled:   [START ON 04/11/2024] aspirin   81 mg Oral Daily   atorvastatin   80 mg Oral Daily   sodium chloride  flush  3 mL Intravenous Q12H    Assessment: 36 YOM admitted with a STEMI on 04/10/24. Past medical history significant for HTN and HLD. 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 Patient will require surgical consultation for possible CABG. Patient start on argatroban infusion in cath lab. Pharmacy consulted to assist with heparin  dosing. Patient not on any anticoagulation prior to hospitalization.   7/19: Hgb 13.1, PLT 213. Will start heparin  8 hours after sheath removal. Per procedure log, sheath removed at 1304.   Goal of Therapy:  Heparin  level 0.3-0.7 units/ml Monitor platelets by anticoagulation protocol: Yes   Plan:  Start heparin  800 units/hr at 2100 (8 hours after sheath removal) Monitor heparin  level 8 hours after heparin  initiation Monitor heparin  level, CBC, and s/sx of bleeding daily F/u plans for CABG  Morna Breach, PharmD PGY2 Cardiology Pharmacy Resident 04/10/2024 2:16 PM

## 2024-04-10 NOTE — ED Notes (Signed)
 Patient placed onto the zoll.

## 2024-04-10 NOTE — Anesthesia Preprocedure Evaluation (Addendum)
 Anesthesia Evaluation  Patient identified by MRN, date of birth, ID band Patient awake    Reviewed: Allergy & Precautions, NPO status , Patient's Chart, lab work & pertinent test results  Airway Mallampati: I  TM Distance: >3 FB Neck ROM: Limited    Dental  (+) Dental Advisory Given, Teeth Intact   Pulmonary neg pulmonary ROS   Pulmonary exam normal breath sounds clear to auscultation       Cardiovascular hypertension, Pt. on medications + Past MI  Normal cardiovascular exam+ Valvular Problems/Murmurs AI  Rhythm:Regular Rate:Normal  Echo 03/2024  1. Left ventricular ejection fraction, by estimation, is 60 to 65%. The left ventricle has normal function. The left ventricle has no regional wall motion abnormalities. Left ventricular diastolic parameters are consistent with Grade I diastolic dysfunction (impaired relaxation).   2. Right ventricular systolic function is normal. The right ventricular size is normal.   3. The mitral valve is normal in structure. No evidence of mitral valve regurgitation. No evidence of mitral stenosis.   4. The aortic valve is normal in structure. Aortic valve regurgitation is mild. No aortic stenosis is present.   5. The inferior vena cava is normal in size with greater than 50% respiratory variability, suggesting right atrial pressure of 3 mmHg.    Cath 03/2024 Extremely complex anatomy, heavily calcified left main and ostium of CX and heavily calcified RCA, extreme high risk for PCI.  Patient is EKG normalized, patient remained chest pain-free, hemodynamics are stable and LVEF is normal.  Hence decision made to take the patient off the table, start him on Aggrastat  and continue IV heparin  once TR band is off and surgical consultation will be obtained for CABG   Neuro/Psych  PSYCHIATRIC DISORDERS  Depression    negative neurological ROS     GI/Hepatic negative GI ROS, Neg liver ROS,,,  Endo/Other   negative endocrine ROS    Renal/GU Renal disease     Musculoskeletal negative musculoskeletal ROS (+)    Abdominal   Peds  Hematology negative hematology ROS (+)   Anesthesia Other Findings   Reproductive/Obstetrics                              Anesthesia Physical Anesthesia Plan  ASA: 4  Anesthesia Plan: General   Post-op Pain Management:    Induction: Intravenous  PONV Risk Score and Plan: 3 and Treatment may vary due to age or medical condition and Midazolam   Airway Management Planned: Oral ETT  Additional Equipment: Arterial line, CVP, Ultrasound Guidance Line Placement and TEE  Intra-op Plan: Utilization Of Total Body Hypothermia per surgeon request  Post-operative Plan: Post-operative intubation/ventilation  Informed Consent: I have reviewed the patients History and Physical, chart, labs and discussed the procedure including the risks, benefits and alternatives for the proposed anesthesia with the patient or authorized representative who has indicated his/her understanding and acceptance.     Dental advisory given  Plan Discussed with: CRNA  Anesthesia Plan Comments:          Anesthesia Quick Evaluation

## 2024-04-10 NOTE — Progress Notes (Signed)
 Echocardiogram 2D Echocardiogram has been performed.  Jesse [name] 04/10/2024, 2:58 PM

## 2024-04-10 NOTE — Progress Notes (Signed)
 PHARMACY - ANTICOAGULATION CONSULT NOTE  Pharmacy Consult for heparin  Indication: chest pain/ACS  Allergies  Allergen Reactions   Hctz [Hydrochlorothiazide ] Other (See Comments)    Hyponatremia ; Na+ 111, w weakness resulting in mechanical fall   Tramadol Nausea And Vomiting    Patient Measurements:    Vital Signs: Temp: 97.8 F (36.6 C) (07/19 1936) Temp Source: Oral (07/19 1936) BP: 107/64 (07/19 1900) Pulse Rate: 66 (07/19 1900)  Labs: Recent Labs    04/10/24 1255 04/10/24 1509  HGB 13.1  --   HCT 38.0*  --   PLT 213  --   CREATININE 0.75  --   TROPONINIHS 136* 420*    Estimated Creatinine Clearance: 65.4 mL/min (by C-G formula based on SCr of 0.75 mg/dL).   Medical History: Past Medical History:  Diagnosis Date   Depression    Hypercholesteremia    Hypertension    HZ (herpes zoster) 09/02/2011   Neuritis    left foot   Polycythemia rubra vera (HCC) 09/02/2011    Medications:  Medications Prior to Admission  Medication Sig Dispense Refill Last Dose/Taking   ALPRAZolam  (XANAX ) 1 MG tablet Take 0.5 mg by mouth at bedtime.   04/09/2024   aspirin  81 MG chewable tablet Chew 324 mg by mouth as needed for mild pain (pain score 1-3) or moderate pain (pain score 4-6).   04/10/2024   Cholecalciferol  50 MCG (2000 UT) CAPS Take 2,000 Units by mouth daily.   04/09/2024   fish oil-omega-3 fatty acids 1000 MG capsule Take 1 g by mouth every other day.   Unknown   gabapentin  (NEURONTIN ) 100 MG capsule Take 2 capsules (200 mg total) by mouth 3 (three) times daily. (Patient taking differently: Take 200 mg by mouth at bedtime.) 180 capsule 0 04/09/2024   lisinopril -hydrochlorothiazide  (ZESTORETIC ) 20-12.5 MG tablet Take 1 tablet by mouth daily.   Past Week   naproxen  (NAPROSYN ) 500 MG tablet TAKE 1 TABLET BY MOUTH TWICE A DAY WITH FOOD 60 tablet 1 04/09/2024 Bedtime   simvastatin  (ZOCOR ) 20 MG tablet Take 1 tablet (20 mg total) by mouth daily at 6 PM. (Patient taking  differently: Take 20 mg by mouth in the morning.) 30 tablet 0 04/09/2024   tamsulosin  (FLOMAX ) 0.4 MG CAPS capsule Take 0.4 mg by mouth daily.   04/09/2024   vitamin B-12 (CYANOCOBALAMIN ) 100 MCG tablet Take 100 mcg by mouth daily.   04/09/2024   celecoxib  (CELEBREX ) 100 MG capsule TAKE 1 CAPSULE BY MOUTH TWICE A DAY (Patient not taking: Reported on 04/06/2024) 84 capsule 0    Scheduled:   [START ON 04/11/2024] aspirin   81 mg Oral Daily   atorvastatin   80 mg Oral Daily   [START ON 04/11/2024] epinephrine   0-10 mcg/min Intravenous To OR   [START ON 04/11/2024] heparin  sodium (porcine) 2,500 Units, papaverine  30 mg in electrolyte-A (PLASMALYTE-A PH 7.4) 500 mL irrigation   Irrigation To OR   [START ON 04/11/2024] insulin    Intravenous To OR   [START ON 04/11/2024] [facility] Blood Cardioplegia vial (lidocaine /magnesium /mannitol  0.26g-4g-6.4g)   Intracoronary To OR   [START ON 04/11/2024] phenylephrine   30-200 mcg/min Intravenous To OR   [START ON 04/11/2024] potassium chloride   80 mEq Other To OR   sodium chloride  flush  3 mL Intravenous Q12H   [START ON 04/11/2024] tranexamic acid   15 mg/kg Intravenous To OR   [START ON 04/11/2024] tranexamic acid   2 mg/kg Intracatheter To OR    Assessment: 75 YOM admitted with a STEMI on 04/10/24. Past  medical history significant for HTN and HLD. 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 Patient will require surgical consultation for possible CABG. Patient start on argatroban infusion in cath lab. Pharmacy consulted to assist with heparin  dosing. Patient not on any anticoagulation prior to hospitalization.   7/19: Hgb 13.1, PLT 213. Will start heparin  8 hours after sheath removal. Per procedure log, sheath removed at 1304.    7/19 PM: TR band removed around 2000 this evening with noted swelling near sheath site. RN reported bleeding from site earlier in the day. Spoke with Dr. Ganji and updated regarding patient statue and he instructed to discontinue heparin  protocol  and NOT start heparin  overnight as the patient is planning to go for CABG tomorrow. Aggrastat  currently infusing and he asked to continue until 0600 tomorrow morning then discontinue in preparation for surgery.   Plan:  No heparin  overnight Continue aggrastat  until 0600 tomorrow (04/11/24)

## 2024-04-10 NOTE — Consult Note (Signed)
 [address]       [place] [address]             [phone]        Jesse [name] [facility] Medical Record #[id number] Date of Birth: [birth year]  Referring: No ref. provider found Primary Care: Ransom Other, MD Primary Cardiologist:Jay Ladona, MD  Chief Complaint:    Chief Complaint  Patient presents with   Code STEMI    History of Present Illness:     Jesse [name] is a 83 y.o. 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  He presented to the ED with chest pain and was ruled in for STEMI.     Past Medical and Surgical History: Previous Chest Surgery: no Previous Chest Radiation: no Diabetes Mellitus: no.  HbA1C 4.9 Creatinine:  Lab Results  Component Value Date   CREATININE 0.75 04/10/2024   CREATININE 0.82 04/06/2024   CREATININE 0.84 12/30/2023     Past Medical History:  Diagnosis Date   Depression    Hypercholesteremia    Hypertension    HZ (herpes zoster) 09/02/2011   Neuritis    left foot   Polycythemia rubra vera (HCC) 09/02/2011    Past Surgical History:  Procedure Laterality Date   GAS INSERTION  06/04/2012   Procedure: INSERTION OF GAS;  Surgeon: Norleen JONETTA Ku, MD;  Location: [facility] OR;  Service: Ophthalmology;  Laterality: Left;  C3F8   SCLERAL BUCKLE  06/04/2012   Procedure: SCLERAL BUCKLE;  Surgeon: Norleen JONETTA Ku, MD;  Location: [facility] OR;  Service: Ophthalmology;  Laterality: Left;  Headscope laser    Social History:  Social History   Tobacco Use  Smoking Status Never  Smokeless Tobacco Never  Tobacco Comments   never used tobacco    Social History   Substance and Sexual Activity  Alcohol Use No   Alcohol/week: 0.0 standard drinks of alcohol     Allergies  Allergen Reactions   Hctz [Hydrochlorothiazide ] Other (See Comments)    Hyponatremia ; Na+ 111, w weakness resulting in mechanical fall   Tramadol Nausea And Vomiting    Medications:   Current Facility-Administered Medications  Medication  Dose Route Frequency Provider Last Rate Last Admin   0.9 %  sodium chloride  infusion  250 mL Intravenous PRN Ganji, Jay, MD 10 mL/hr at 04/10/24 1600 Infusion Verify at 04/10/24 1600   acetaminophen  (TYLENOL ) tablet 650 mg  650 mg Oral Q4H PRN Ladona Heinz, MD       NOREEN ON 04/11/2024] aspirin  chewable tablet 81 mg  81 mg Oral Daily Ganji, Jay, MD       atorvastatin  (LIPITOR) tablet 80 mg  80 mg Oral Daily Ganji, Jay, MD   80 mg at 04/10/24 1507   heparin  ADULT infusion 100 units/mL (25000 units/250mL)  800 Units/hr Intravenous Continuous Serena Morna SAUNDERS, RPH       magnesium  sulfate IVPB 2 g 50 mL  2 g Intravenous Once Katsaros, Lindsey R, RPH       ondansetron  (ZOFRAN ) injection 4 mg  4 mg Intravenous Q6H PRN Ladona Heinz, MD       Oral care mouth rinse  15 mL Mouth Rinse PRN Ganji, Jay, MD       potassium chloride  (KLOR-CON  M) CR tablet 40 mEq  40 mEq Oral Q4H Serena Morna SAUNDERS, RPH   40 mEq at 04/10/24 1610   sodium chloride  flush (NS) 0.9 % injection 3 mL  3 mL Intravenous  Q12H Ladona Heinz, MD   3 mL at 04/10/24 1509   sodium chloride  flush (NS) 0.9 % injection 3 mL  3 mL Intravenous PRN Ladona Heinz, MD       tirofiban  (AGGRASTAT ) infusion 50 mcg/mL 100 mL  0.15 mcg/kg/min Intravenous Continuous Ladona Heinz, MD 11.92 mL/hr at 04/10/24 1621 0.15 mcg/kg/min at 04/10/24 1621    Medications Prior to Admission  Medication Sig Dispense Refill Last Dose/Taking   ALPRAZolam  (XANAX ) 1 MG tablet Take 0.5 mg by mouth at bedtime.      celecoxib  (CELEBREX ) 100 MG capsule TAKE 1 CAPSULE BY MOUTH TWICE A DAY (Patient not taking: Reported on 04/06/2024) 84 capsule 0    Cholecalciferol  50 MCG (2000 UT) CAPS Take 2,000 Units by mouth daily.      fish oil-omega-3 fatty acids 1000 MG capsule Take 1 g by mouth daily.      gabapentin  (NEURONTIN ) 100 MG capsule Take 2 capsules (200 mg total) by mouth 3 (three) times daily. 180 capsule 0    lisinopril -hydrochlorothiazide  (ZESTORETIC ) 20-12.5 MG tablet Take 1 tablet  by mouth daily.      naproxen  (NAPROSYN ) 500 MG tablet TAKE 1 TABLET BY MOUTH TWICE A DAY WITH FOOD 60 tablet 1    simvastatin  (ZOCOR ) 20 MG tablet Take 1 tablet (20 mg total) by mouth daily at 6 PM. 30 tablet 0    tamsulosin  (FLOMAX ) 0.4 MG CAPS capsule Take 0.4 mg by mouth daily.      vitamin B-12 (CYANOCOBALAMIN ) 100 MCG tablet Take 100 mcg by mouth daily.      vitamin E  1000 UNIT capsule Take 1,000 Units by mouth daily.       Family History  Problem Relation Age of Onset   Heart attack Mother      Review of Systems:   Review of Systems  Constitutional:  Positive for malaise/fatigue.  Respiratory:  Positive for shortness of breath.   Cardiovascular:  Positive for chest pain.  Neurological: Negative.       Physical Exam: BP 108/64   Pulse 67   Temp 97.8 F (36.6 C) (Oral)   Resp 15   SpO2 98%  Physical Exam Constitutional:      General: He is not in acute distress.    Appearance: He is not ill-appearing.  HENT:     Head: Normocephalic and atraumatic.  Eyes:     Extraocular Movements: Extraocular movements intact.  Cardiovascular:     Rate and Rhythm: Normal rate and regular rhythm.  Pulmonary:     Effort: Pulmonary effort is normal. No respiratory distress.  Abdominal:     General: Abdomen is flat. There is no distension.  Musculoskeletal:        General: Normal range of motion.     Cervical back: Normal range of motion.  Skin:    General: Skin is warm and dry.  Neurological:     General: No focal deficit present.     Mental Status: He is alert and oriented to person, place, and time.       Diagnostic Studies & Laboratory data: Cardiac Studies & Procedures   ______________________________________________________________________________________________ CARDIAC CATHETERIZATION  CARDIAC CATHETERIZATION 04/10/2024  Conclusion Images from the original result were not included. Cardiac Catheterization 04/10/24: Hemodynamic data: LV 109/-10, EDP 11 mmHg.   Ao 125/66, mean 76 mmHg.  No pressure gradient across the aortic valve.  Angiographic data: LM: Distal left main has calcific eccentric 99% stenosis. LAD: Flush occluded and reconstitutes at the mid to distal  level via collaterals from CX.  No large diagonals visualized. LCx: Ostium calcified with a 40% stenosis.  Large OM 2 and a moderate to large OM 3 with a focal 90% stenosis.  Collaterals given to LAD.  TIMI-3 flow. RCA: Very large caliber vessel, diffusely diseased and heavily calcified.  Proximal segment is occluded with bridging collaterals and there is a tandem mid segment calcific 90% stenosis with probable mild thrombus and distal RCA has a 60% focal stenosis.  TIMI II flow. LV: Normal LV systolic function.  No wall motion abnormality.  No significant mitral regurgitation. Left and right subclavian and IMA: Widely patent.    Impression and recommendations: Extremely complex anatomy, heavily calcified left main and ostium of CX and heavily calcified RCA, extreme high risk for PCI.  Patient is EKG normalized, patient remained chest pain-free, hemodynamics are stable and LVEF is normal.  Hence decision made to take the patient off the table, start him on Aggrastat  and continue IV heparin  once TR band is off and surgical consultation will be obtained for CABG.  Findings Coronary Findings Diagnostic  Dominance: Right  Left Main Mid LM to Dist LM lesion is 99% stenosed. Dist LM to Mid LAD lesion is 100% stenosed.  Left Anterior Descending Collaterals Dist LAD filled by collaterals from 3rd Mrg.  Collaterals Dist LAD filled by collaterals from 2nd Mrg.  Left Circumflex Ost Cx lesion is 40% stenosed.  Third Obtuse Marginal Branch 3rd Mrg lesion is 90% stenosed.  Right Coronary Artery Collaterals Prox RCA filled by collaterals from Prox RCA.  Prox RCA lesion is 100% stenosed. Prox RCA to Mid RCA lesion is 90% stenosed. Dist RCA lesion is 60% stenosed.  Intervention  No  interventions have been documented.     ECHOCARDIOGRAM  ECHOCARDIOGRAM COMPLETE 04/10/2024  Narrative ECHOCARDIOGRAM REPORT    Patient Name:   CINCH ORMOND Date of Exam: 04/10/2024 Medical Rec #:  [id number]     Height:       66.0 in Accession #:    [id number]    Weight:       146.0 lb Date of Birth:  [birth year]    BSA:          1.749 m Patient Age:    82 years      BP:           104/63 mmHg Patient Gender: M             HR:           113 bpm. Exam Location:  Inpatient  Procedure: 2D Echo, Cardiac Doppler and Color Doppler (Both Spectral and Color Flow Doppler were utilized during procedure).  Indications:    Abnormal ECG R94.31  History:        Patient has no prior history of Echocardiogram examinations. Previous Myocardial Infarction; Risk Factors:Hypertension and Dyslipidemia.  Sonographer:    Thea Norlander RCS Referring Phys: [id number] Anwyn Kriegel O Keyonni Percival  IMPRESSIONS   1. Left ventricular ejection fraction, by estimation, is 60 to 65%. The left ventricle has normal function. The left ventricle has no regional wall motion abnormalities. Left ventricular diastolic parameters are consistent with Grade I diastolic dysfunction (impaired relaxation). 2. Right ventricular systolic function is normal. The right ventricular size is normal. 3. The mitral valve is normal in structure. No evidence of mitral valve regurgitation. No evidence of mitral stenosis. 4. The aortic valve is normal in structure. Aortic valve regurgitation is mild. No aortic stenosis is present. 5. The inferior vena  cava is normal in size with greater than 50% respiratory variability, suggesting right atrial pressure of 3 mmHg.  FINDINGS Left Ventricle: Left ventricular ejection fraction, by estimation, is 60 to 65%. The left ventricle has normal function. The left ventricle has no regional wall motion abnormalities. The left ventricular internal cavity size was normal in size. There is no left ventricular  hypertrophy. Left ventricular diastolic parameters are consistent with Grade I diastolic dysfunction (impaired relaxation).  Right Ventricle: The right ventricular size is normal. No increase in right ventricular wall thickness. Right ventricular systolic function is normal.  Left Atrium: Left atrial size was normal in size.  Right Atrium: Right atrial size was normal in size.  Pericardium: There is no evidence of pericardial effusion.  Mitral Valve: The mitral valve is normal in structure. No evidence of mitral valve regurgitation. No evidence of mitral valve stenosis.  Tricuspid Valve: The tricuspid valve is normal in structure. Tricuspid valve regurgitation is mild . No evidence of tricuspid stenosis.  Aortic Valve: The aortic valve is normal in structure. Aortic valve regurgitation is mild. No aortic stenosis is present. Aortic valve peak gradient measures 5.1 mmHg.  Pulmonic Valve: The pulmonic valve was normal in structure. Pulmonic valve regurgitation is not visualized. No evidence of pulmonic stenosis.  Aorta: The aortic root is normal in size and structure.  Venous: The inferior vena cava is normal in size with greater than 50% respiratory variability, suggesting right atrial pressure of 3 mmHg.  IAS/Shunts: No atrial level shunt detected by color flow Doppler.   LEFT VENTRICLE PLAX 2D LVIDd:         4.00 cm   Diastology LVIDs:         2.90 cm   LV e' medial:    6.74 cm/s LV PW:         1.20 cm   LV E/e' medial:  7.8 LV IVS:        0.90 cm   LV e' lateral:   13.30 cm/s LVOT diam:     2.20 cm   LV E/e' lateral: 4.0 LV SV:         53 LV SV Index:   30 LVOT Area:     3.80 cm   RIGHT VENTRICLE RV S prime:     12.80 cm/s TAPSE (M-mode): 2.3 cm  LEFT ATRIUM             Index        RIGHT ATRIUM           Index LA diam:        3.40 cm 1.94 cm/m   RA Area:     10.80 cm LA Vol (A2C):   32.5 ml 18.58 ml/m  RA Volume:   17.90 ml  10.23 ml/m LA Vol (A4C):   37.9 ml 21.66  ml/m LA Biplane Vol: 37.2 ml 21.26 ml/m AORTIC VALVE AV Area (Vmax): 2.33 cm AV Vmax:        113.00 cm/s AV Peak Grad:   5.1 mmHg LVOT Vmax:      69.20 cm/s LVOT Vmean:     44.500 cm/s LVOT VTI:       0.140 m  AORTA Ao Asc diam: 3.70 cm  MITRAL VALVE MV Area (PHT): 3.17 cm    SHUNTS MV Decel Time: 239 msec    Systemic VTI:  0.14 m MV E velocity: 52.90 cm/s  Systemic Diam: 2.20 cm MV A velocity: 55.50 cm/s MV E/A ratio:  0.95  Oneil Parchment MD Electronically signed by Oneil Parchment MD Signature Date/Time: 04/10/2024/5:05:33 PM    Final          ______________________________________________________________________________________________     EKG: sinus I have independently reviewed the above radiologic studies and discussed with the patient   Recent Lab Findings: Lab Results  Component Value Date   WBC 7.2 04/10/2024   HGB 13.1 04/10/2024   HCT 38.0 (L) 04/10/2024   PLT 213 04/10/2024   GLUCOSE 146 (H) 04/10/2024   CHOL 129 04/10/2024   TRIG 51 04/10/2024   HDL 36 (L) 04/10/2024   LDLCALC 83 04/10/2024   ALT 14 04/10/2024   AST 17 04/10/2024   NA 132 (L) 04/10/2024   K 3.3 (L) 04/10/2024   CL 104 04/10/2024   CREATININE 0.75 04/10/2024   BUN 14 04/10/2024   CO2 19 (L) 04/10/2024   TSH 1.335 04/10/2024   HGBA1C 4.9 04/10/2024     Intervention   Assessment / Plan:   83 y.o. male with 3V CAD and presents with a STEMI.  Echocardiogram shows preserved biventricular function and no significant valvular disease.  The risks and benefits of CABG were discussed in detail.  The patient is  agreeable to proceed.      I  spent 40 minutes counseling the patient face to face.   Linnie [name] 04/10/2024 5:18 PM

## 2024-04-10 NOTE — ED Provider Notes (Signed)
 [place] EMERGENCY DEPARTMENT AT [facility] Provider Note   CSN: [id number] Arrival date & time: 04/10/24  1207     Patient presents with: Code STEMI   Jesse [name] is a 83 y.o. male.   HPI 83 year old male presents today with chest pain.  Patient was seen and evaluated by EMS.  Prehospital EKG was transmitted and evaluated by another EDP.  Code STEMI was called based on ST elevation in aVR V1 with reciprocity in inferior and lateral leads.  Patient had anterior chest pain across his upper chest that was 9 out of 10.  Prehospital he received nitroglycerin  and aspirin  and pain has decreased to 4 out of 10.  Patient states he has some mild dyspnea.  He denies any abdominal pain.  He states he had the pain last night and then it went away on its own.  Return today at lunchtime while he was eating.    Prior to Admission medications   Medication Sig Start Date End Date Taking? Authorizing Provider  ALPRAZolam  (XANAX ) 1 MG tablet Take 0.5 mg by mouth at bedtime. 03/13/21   [provider]  celecoxib  (CELEBREX ) 100 MG capsule TAKE 1 CAPSULE BY MOUTH TWICE A DAY Patient not taking: Reported on 04/06/2024 12/18/23   Georgina Ozell LABOR, MD  Cholecalciferol  50 MCG (2000 UT) CAPS Take 2,000 Units by mouth daily.    [provider]  fish oil-omega-3 fatty acids 1000 MG capsule Take 1 g by mouth daily.    [provider]  gabapentin  (NEURONTIN ) 100 MG capsule Take 2 capsules (200 mg total) by mouth 3 (three) times daily. 10/13/23 04/06/24  Georgina Ozell LABOR, MD  lisinopril -hydrochlorothiazide  (ZESTORETIC ) 20-12.5 MG tablet Take 1 tablet by mouth daily.    [provider]  naproxen  (NAPROSYN ) 500 MG tablet TAKE 1 TABLET BY MOUTH TWICE A DAY WITH FOOD 03/15/24   Georgina Ozell LABOR, MD  simvastatin  (ZOCOR ) 20 MG tablet Take 1 tablet (20 mg total) by mouth daily at 6 PM. 03/05/21   Medina-Vargas, Monina C, NP  tamsulosin  (FLOMAX ) 0.4 MG CAPS capsule Take 0.4 mg by  mouth daily. 03/26/21   [provider]  vitamin B-12 (CYANOCOBALAMIN ) 100 MCG tablet Take 100 mcg by mouth daily.    [provider]  vitamin E  1000 UNIT capsule Take 1,000 Units by mouth daily. 11/18/19   [provider]    Allergies: Hctz [hydrochlorothiazide ] and Tramadol    Review of Systems  Updated Vital Signs BP 125/82 (BP Location: Left Arm)   Pulse 100   Temp 98.5 F (36.9 C) (Temporal)   Resp (!) 22   SpO2 100%   Physical Exam Vitals reviewed.  HENT:     Head: Normocephalic.     Right Ear: External ear normal.     Left Ear: External ear normal.     Nose: Nose normal.     Mouth/Throat:     Pharynx: Oropharynx is clear.  Eyes:     Pupils: Pupils are equal, round, and reactive to light.  Cardiovascular:     Rate and Rhythm: Regular rhythm. Tachycardia present.     Pulses: Normal pulses.     Heart sounds: Normal heart sounds.  Pulmonary:     Effort: Pulmonary effort is normal.     Breath sounds: Normal breath sounds.  Abdominal:     General: Abdomen is flat. Bowel sounds are normal.     Palpations: Abdomen is soft.  Musculoskeletal:  General: No swelling or tenderness. Normal range of motion.     Cervical back: Normal range of motion.  Skin:    General: Skin is warm and dry.     Capillary Refill: Capillary refill takes less than 2 seconds.  Neurological:     General: No focal deficit present.     Mental Status: He is alert.  Psychiatric:        Mood and Affect: Mood normal.     (all labs ordered are listed, but only abnormal results are displayed) Labs Reviewed - No data to display  EKG: None  Radiology: No results found.   Procedures   Medications Ordered in the ED  heparin  injection 4,000 Units (4,000 Units Intravenous Given 04/10/24 1216)                                    Medical Decision Making Risk Prescription drug management.   Care discussed with Dr. Ladona.  He will be taken to the Cath  Lab. Orders placed including heparin  and patient received heparin  Repeat EKG here in ED shows normal sinus rhythm with continued ST elevation in aVR but resolution of ST depression. Differential diagnosis includes but is not limited to cardiac etiology from MI, other etiologies of chest pain including PE, pneumothorax, infection including pneumonia, upper abdominal etiologies such as biliary colic.  Patient did have EKG that was abnormal and has improved with decreasing pain.  Agree with plan for patient to go to Cath Lab as discussed with Dr. Ladona.     Final diagnoses:  Other type of myocardial infarction [facility])    ED Discharge Orders     None          Levander Houston, MD 04/10/24 1229

## 2024-04-11 ENCOUNTER — Other Ambulatory Visit (HOSPITAL_COMMUNITY)

## 2024-04-11 ENCOUNTER — Inpatient Hospital Stay (HOSPITAL_COMMUNITY)

## 2024-04-11 ENCOUNTER — Encounter (HOSPITAL_COMMUNITY): Payer: Self-pay | Admitting: Cardiology

## 2024-04-11 ENCOUNTER — Inpatient Hospital Stay (HOSPITAL_COMMUNITY)
Admission: EM | Disposition: A | Discharge status: Skilled Nursing Facility | Payer: Self-pay | Source: Home / Self Care | Attending: Thoracic Surgery (Cardiothoracic Vascular Surgery)

## 2024-04-11 ENCOUNTER — Inpatient Hospital Stay (HOSPITAL_COMMUNITY): Admitting: Anesthesiology

## 2024-04-11 ENCOUNTER — Other Ambulatory Visit: Payer: Self-pay

## 2024-04-11 DIAGNOSIS — I10 Essential (primary) hypertension: Secondary | ICD-10-CM | POA: Diagnosis not present

## 2024-04-11 DIAGNOSIS — I214 Non-ST elevation (NSTEMI) myocardial infarction: Secondary | ICD-10-CM

## 2024-04-11 DIAGNOSIS — Z951 Presence of aortocoronary bypass graft: Secondary | ICD-10-CM

## 2024-04-11 DIAGNOSIS — I5032 Chronic diastolic (congestive) heart failure: Secondary | ICD-10-CM | POA: Diagnosis not present

## 2024-04-11 DIAGNOSIS — E785 Hyperlipidemia, unspecified: Secondary | ICD-10-CM

## 2024-04-11 DIAGNOSIS — I2109 ST elevation (STEMI) myocardial infarction involving other coronary artery of anterior wall: Secondary | ICD-10-CM

## 2024-04-11 DIAGNOSIS — D45 Polycythemia vera: Secondary | ICD-10-CM

## 2024-04-11 DIAGNOSIS — I2510 Atherosclerotic heart disease of native coronary artery without angina pectoris: Secondary | ICD-10-CM

## 2024-04-11 HISTORY — PX: CORONARY ARTERY BYPASS GRAFT: SHX141

## 2024-04-11 HISTORY — DX: Presence of aortocoronary bypass graft: Z95.1

## 2024-04-11 HISTORY — PX: INTRAOPERATIVE TRANSESOPHAGEAL ECHOCARDIOGRAM: SHX5062

## 2024-04-11 LAB — POCT I-STAT 7, (LYTES, BLD GAS, ICA,H+H)
Acid-base deficit: 7 mmol/L — ABNORMAL HIGH (ref 0.0–2.0)
Acid-base deficit: 3 mmol/L — ABNORMAL HIGH (ref 0.0–2.0)
Acid-base deficit: 5 mmol/L — ABNORMAL HIGH (ref 0.0–2.0)
Acid-base deficit: 5 mmol/L — ABNORMAL HIGH (ref 0.0–2.0)
Acid-base deficit: 6 mmol/L — ABNORMAL HIGH (ref 0.0–2.0)
Acid-base deficit: 5 mmol/L — ABNORMAL HIGH (ref 0.0–2.0)
Bicarbonate: 17.6 mmol/L — ABNORMAL LOW (ref 20.0–28.0)
Bicarbonate: 22.5 mmol/L (ref 20.0–28.0)
Bicarbonate: 19.3 mmol/L — ABNORMAL LOW (ref 20.0–28.0)
Bicarbonate: 20.3 mmol/L (ref 20.0–28.0)
Bicarbonate: 18.3 mmol/L — ABNORMAL LOW (ref 20.0–28.0)
Bicarbonate: 19.9 mmol/L — ABNORMAL LOW (ref 20.0–28.0)
Calcium, Ion: 1.22 mmol/L (ref 1.15–1.40)
Calcium, Ion: 0.85 mmol/L — CL (ref 1.15–1.40)
Calcium, Ion: 1.36 mmol/L (ref 1.15–1.40)
Calcium, Ion: 1.29 mmol/L (ref 1.15–1.40)
Calcium, Ion: 1.17 mmol/L (ref 1.15–1.40)
Calcium, Ion: 1.21 mmol/L (ref 1.15–1.40)
HCT: 38 % — ABNORMAL LOW (ref 39.0–52.0)
HCT: 26 % — ABNORMAL LOW (ref 39.0–52.0)
HCT: 28 % — ABNORMAL LOW (ref 39.0–52.0)
HCT: 30 % — ABNORMAL LOW (ref 39.0–52.0)
HCT: 26 % — ABNORMAL LOW (ref 39.0–52.0)
HCT: 29 % — ABNORMAL LOW (ref 39.0–52.0)
Hemoglobin: 12.9 g/dL — ABNORMAL LOW (ref 13.0–17.0)
Hemoglobin: 8.8 g/dL — ABNORMAL LOW (ref 13.0–17.0)
Hemoglobin: 9.5 g/dL — ABNORMAL LOW (ref 13.0–17.0)
Hemoglobin: 10.2 g/dL — ABNORMAL LOW (ref 13.0–17.0)
Hemoglobin: 8.8 g/dL — ABNORMAL LOW (ref 13.0–17.0)
Hemoglobin: 9.9 g/dL — ABNORMAL LOW (ref 13.0–17.0)
O2 Saturation: 100 %
O2 Saturation: 100 %
O2 Saturation: 100 %
O2 Saturation: 97 %
O2 Saturation: 99 %
O2 Saturation: 98 %
Patient temperature: 35.6
Patient temperature: 98.2
Patient temperature: 36.7
Potassium: 3.7 mmol/L (ref 3.5–5.1)
Potassium: 4.9 mmol/L (ref 3.5–5.1)
Potassium: 4.2 mmol/L (ref 3.5–5.1)
Potassium: 3.9 mmol/L (ref 3.5–5.1)
Potassium: 4.3 mmol/L (ref 3.5–5.1)
Potassium: 4.5 mmol/L (ref 3.5–5.1)
Sodium: 135 mmol/L (ref 135–145)
Sodium: 138 mmol/L (ref 135–145)
Sodium: 136 mmol/L (ref 135–145)
Sodium: 136 mmol/L (ref 135–145)
Sodium: 136 mmol/L (ref 135–145)
Sodium: 133 mmol/L — ABNORMAL LOW (ref 135–145)
TCO2: 19 mmol/L — ABNORMAL LOW (ref 22–32)
TCO2: 24 mmol/L (ref 22–32)
TCO2: 20 mmol/L — ABNORMAL LOW (ref 22–32)
TCO2: 21 mmol/L — ABNORMAL LOW (ref 22–32)
TCO2: 19 mmol/L — ABNORMAL LOW (ref 22–32)
TCO2: 21 mmol/L — ABNORMAL LOW (ref 22–32)
pCO2 arterial: 32.3 mmHg (ref 32–48)
pCO2 arterial: 39.6 mmHg (ref 32–48)
pCO2 arterial: 32.1 mmHg (ref 32–48)
pCO2 arterial: 34.9 mmHg (ref 32–48)
pCO2 arterial: 30.5 mmHg — ABNORMAL LOW (ref 32–48)
pCO2 arterial: 34.1 mmHg (ref 32–48)
pH, Arterial: 7.343 — ABNORMAL LOW (ref 7.35–7.45)
pH, Arterial: 7.362 (ref 7.35–7.45)
pH, Arterial: 7.387 (ref 7.35–7.45)
pH, Arterial: 7.365 (ref 7.35–7.45)
pH, Arterial: 7.387 (ref 7.35–7.45)
pH, Arterial: 7.374 (ref 7.35–7.45)
pO2, Arterial: 528 mmHg — ABNORMAL HIGH (ref 83–108)
pO2, Arterial: 453 mmHg — ABNORMAL HIGH (ref 83–108)
pO2, Arterial: 413 mmHg — ABNORMAL HIGH (ref 83–108)
pO2, Arterial: 83 mmHg (ref 83–108)
pO2, Arterial: 125 mmHg — ABNORMAL HIGH (ref 83–108)
pO2, Arterial: 110 mmHg — ABNORMAL HIGH (ref 83–108)

## 2024-04-11 LAB — BPAM PLATELET PHERESIS
Blood Product Expiration Date: 202507212359
Blood Product Expiration Date: 202507212359
Blood Product Expiration Date: 202507222359
ISSUE DATE / TIME: 202507201226
ISSUE DATE / TIME: 202507201419
ISSUE DATE / TIME: 202507201424
Unit Type and Rh: 5100
Unit Type and Rh: 5100
Unit Type and Rh: 7300

## 2024-04-11 LAB — BASIC METABOLIC PANEL WITH GFR
Anion gap: 8 (ref 5–15)
Anion gap: 8 (ref 5–15)
BUN: 10 mg/dL (ref 8–23)
BUN: 10 mg/dL (ref 8–23)
CO2: 19 mmol/L — ABNORMAL LOW (ref 22–32)
CO2: 20 mmol/L — ABNORMAL LOW (ref 22–32)
Calcium: 9.2 mg/dL (ref 8.9–10.3)
Calcium: 8.2 mg/dL — ABNORMAL LOW (ref 8.9–10.3)
Chloride: 106 mmol/L (ref 98–111)
Chloride: 105 mmol/L (ref 98–111)
Creatinine, Ser: 0.65 mg/dL (ref 0.61–1.24)
Creatinine, Ser: 0.65 mg/dL (ref 0.61–1.24)
GFR, Estimated: >60 mL/min (ref >60)
GFR, Estimated: >60 mL/min (ref >60)
Glucose, Bld: 93 mg/dL (ref 70–99)
Glucose, Bld: 182 mg/dL — ABNORMAL HIGH (ref 70–99)
Potassium: 4.6 mmol/L (ref 3.5–5.1)
Potassium: 4.5 mmol/L (ref 3.5–5.1)
Sodium: 133 mmol/L — ABNORMAL LOW (ref 135–145)
Sodium: 133 mmol/L — ABNORMAL LOW (ref 135–145)

## 2024-04-11 LAB — POCT I-STAT, CHEM 8
BUN: 10 mg/dL (ref 8–23)
BUN: 10 mg/dL (ref 8–23)
BUN: 8 mg/dL (ref 8–23)
BUN: 10 mg/dL (ref 8–23)
BUN: 10 mg/dL (ref 8–23)
BUN: 9 mg/dL (ref 8–23)
Calcium, Ion: 1.25 mmol/L (ref 1.15–1.40)
Calcium, Ion: 0.86 mmol/L — CL (ref 1.15–1.40)
Calcium, Ion: 1.24 mmol/L (ref 1.15–1.40)
Calcium, Ion: 1.07 mmol/L — ABNORMAL LOW (ref 1.15–1.40)
Calcium, Ion: 1.1 mmol/L — ABNORMAL LOW (ref 1.15–1.40)
Calcium, Ion: 1.37 mmol/L (ref 1.15–1.40)
Chloride: 105 mmol/L (ref 98–111)
Chloride: 104 mmol/L (ref 98–111)
Chloride: 98 mmol/L (ref 98–111)
Chloride: 103 mmol/L (ref 98–111)
Chloride: 104 mmol/L (ref 98–111)
Chloride: 107 mmol/L (ref 98–111)
Creatinine, Ser: 0.4 mg/dL — ABNORMAL LOW (ref 0.61–1.24)
Creatinine, Ser: 0.3 mg/dL — ABNORMAL LOW (ref 0.61–1.24)
Creatinine, Ser: 0.5 mg/dL — ABNORMAL LOW (ref 0.61–1.24)
Creatinine, Ser: 0.4 mg/dL — ABNORMAL LOW (ref 0.61–1.24)
Creatinine, Ser: 0.4 mg/dL — ABNORMAL LOW (ref 0.61–1.24)
Creatinine, Ser: 0.4 mg/dL — ABNORMAL LOW (ref 0.61–1.24)
Glucose, Bld: 142 mg/dL — ABNORMAL HIGH (ref 70–99)
Glucose, Bld: 100 mg/dL — ABNORMAL HIGH (ref 70–99)
Glucose, Bld: 120 mg/dL — ABNORMAL HIGH (ref 70–99)
Glucose, Bld: 132 mg/dL — ABNORMAL HIGH (ref 70–99)
Glucose, Bld: 134 mg/dL — ABNORMAL HIGH (ref 70–99)
Glucose, Bld: 121 mg/dL — ABNORMAL HIGH (ref 70–99)
HCT: 39 % (ref 39.0–52.0)
HCT: 26 % — ABNORMAL LOW (ref 39.0–52.0)
HCT: 41 % (ref 39.0–52.0)
HCT: 27 % — ABNORMAL LOW (ref 39.0–52.0)
HCT: 27 % — ABNORMAL LOW (ref 39.0–52.0)
HCT: 28 % — ABNORMAL LOW (ref 39.0–52.0)
Hemoglobin: 13.3 g/dL (ref 13.0–17.0)
Hemoglobin: 13.9 g/dL (ref 13.0–17.0)
Hemoglobin: 8.8 g/dL — ABNORMAL LOW (ref 13.0–17.0)
Hemoglobin: 9.2 g/dL — ABNORMAL LOW (ref 13.0–17.0)
Hemoglobin: 9.2 g/dL — ABNORMAL LOW (ref 13.0–17.0)
Hemoglobin: 9.5 g/dL — ABNORMAL LOW (ref 13.0–17.0)
Potassium: 3.7 mmol/L (ref 3.5–5.1)
Potassium: 4.1 mmol/L (ref 3.5–5.1)
Potassium: 4.9 mmol/L (ref 3.5–5.1)
Potassium: 4.9 mmol/L (ref 3.5–5.1)
Potassium: 5.1 mmol/L (ref 3.5–5.1)
Potassium: 4.3 mmol/L (ref 3.5–5.1)
Sodium: 136 mmol/L (ref 135–145)
Sodium: 134 mmol/L — ABNORMAL LOW (ref 135–145)
Sodium: 137 mmol/L (ref 135–145)
Sodium: 134 mmol/L — ABNORMAL LOW (ref 135–145)
Sodium: 134 mmol/L — ABNORMAL LOW (ref 135–145)
Sodium: 136 mmol/L (ref 135–145)
TCO2: 20 mmol/L — ABNORMAL LOW (ref 22–32)
TCO2: 19 mmol/L — ABNORMAL LOW (ref 22–32)
TCO2: 24 mmol/L (ref 22–32)
TCO2: 21 mmol/L — ABNORMAL LOW (ref 22–32)
TCO2: 22 mmol/L (ref 22–32)
TCO2: 22 mmol/L (ref 22–32)

## 2024-04-11 LAB — BPAM CRYOPRECIPITATE
Blood Product Expiration Date: 202507202359
Blood Product Expiration Date: 202507202359
ISSUE DATE / TIME: 202507201226
ISSUE DATE / TIME: 202507201226
Unit Type and Rh: 5100
Unit Type and Rh: 6200

## 2024-04-11 LAB — PLATELET COUNT: Platelets: 204 K/uL (ref 150–400)

## 2024-04-11 LAB — CBC
HCT: 43.8 % (ref 39.0–52.0)
HCT: 30.7 % — ABNORMAL LOW (ref 39.0–52.0)
HCT: 29.2 % — ABNORMAL LOW (ref 39.0–52.0)
Hemoglobin: 14.9 g/dL (ref 13.0–17.0)
Hemoglobin: 10.2 g/dL — ABNORMAL LOW (ref 13.0–17.0)
Hemoglobin: 9.8 g/dL — ABNORMAL LOW (ref 13.0–17.0)
MCH: 32 pg (ref 26.0–34.0)
MCH: 32.3 pg (ref 26.0–34.0)
MCH: 32.6 pg (ref 26.0–34.0)
MCHC: 34 g/dL (ref 30.0–36.0)
MCHC: 33.2 g/dL (ref 30.0–36.0)
MCHC: 33.6 g/dL (ref 30.0–36.0)
MCV: 94.2 fL (ref 80.0–100.0)
MCV: 97.2 fL (ref 80.0–100.0)
MCV: 97 fL (ref 80.0–100.0)
Platelets: 243 K/uL (ref 150–400)
Platelets: 145 K/uL — ABNORMAL LOW (ref 150–400)
Platelets: 147 K/uL — ABNORMAL LOW (ref 150–400)
RBC: 4.65 MIL/uL (ref 4.22–5.81)
RBC: 3.16 MIL/uL — ABNORMAL LOW (ref 4.22–5.81)
RBC: 3.01 MIL/uL — ABNORMAL LOW (ref 4.22–5.81)
RDW: 13.9 % (ref 11.5–15.5)
RDW: 14 % (ref 11.5–15.5)
RDW: 14.3 % (ref 11.5–15.5)
WBC: 9.7 K/uL (ref 4.0–10.5)
WBC: 19.4 K/uL — ABNORMAL HIGH (ref 4.0–10.5)
WBC: 14.9 K/uL — ABNORMAL HIGH (ref 4.0–10.5)
nRBC: 0 % (ref 0.0–0.2)
nRBC: 0 % (ref 0.0–0.2)
nRBC: 0 % (ref 0.0–0.2)

## 2024-04-11 LAB — PREPARE CRYOPRECIPITATE: Unit division: 0

## 2024-04-11 LAB — PROTIME-INR
INR: 2.2 — ABNORMAL HIGH (ref 0.8–1.2)
Prothrombin Time: 25.2 s — ABNORMAL HIGH (ref 11.4–15.2)

## 2024-04-11 LAB — PREPARE PLATELET PHERESIS
Unit division: 0
Unit division: 0
Unit division: 0

## 2024-04-11 LAB — SURGICAL PCR SCREEN
MRSA, PCR: NEGATIVE
Staphylococcus aureus: NEGATIVE

## 2024-04-11 LAB — HEMOGLOBIN AND HEMATOCRIT, BLOOD
HCT: 27.7 % — ABNORMAL LOW (ref 39.0–52.0)
Hemoglobin: 9.4 g/dL — ABNORMAL LOW (ref 13.0–17.0)

## 2024-04-11 LAB — APTT: aPTT: 37 s — ABNORMAL HIGH (ref 24–36)

## 2024-04-11 LAB — GLUCOSE, CAPILLARY
Glucose-Capillary: 120 mg/dL — ABNORMAL HIGH (ref 70–99)
Glucose-Capillary: 125 mg/dL — ABNORMAL HIGH (ref 70–99)
Glucose-Capillary: 132 mg/dL — ABNORMAL HIGH (ref 70–99)
Glucose-Capillary: 182 mg/dL — ABNORMAL HIGH (ref 70–99)
Glucose-Capillary: 172 mg/dL — ABNORMAL HIGH (ref 70–99)

## 2024-04-11 LAB — FIBRINOGEN: Fibrinogen: 151 mg/dL — ABNORMAL LOW (ref 210–475)

## 2024-04-11 LAB — PREPARE RBC (CROSSMATCH)

## 2024-04-11 LAB — MAGNESIUM: Magnesium: 3 mg/dL — ABNORMAL HIGH (ref 1.7–2.4)

## 2024-04-11 SURGERY — CORONARY ARTERY BYPASS GRAFTING (CABG)
Anesthesia: General | Site: Chest

## 2024-04-11 MED ORDER — PHENYLEPHRINE 80 MCG/ML (10ML) SYRINGE FOR IV PUSH (FOR BLOOD PRESSURE SUPPORT)
PREFILLED_SYRINGE | INTRAVENOUS | Status: DC | PRN
  Administered 2024-04-11 (×7): 80 ug via INTRAVENOUS

## 2024-04-11 MED ORDER — FENTANYL CITRATE (PF) 250 MCG/5ML IJ SOLN
INTRAMUSCULAR | Status: AC
  Filled 2024-04-11: qty 5

## 2024-04-11 MED ORDER — BISACODYL 5 MG PO TBEC
10.0000 mg | DELAYED_RELEASE_TABLET | Freq: Every day | ORAL | Status: DC
  Administered 2024-04-12 – 2024-04-22 (×7): 10 mg via ORAL
  Filled 2024-04-11 (×10): qty 2

## 2024-04-11 MED ORDER — METOPROLOL TARTRATE 12.5 MG HALF TABLET
12.5000 mg | ORAL_TABLET | Freq: Two times a day (BID) | ORAL | Status: DC
  Administered 2024-04-13 (×2): 12.5 mg via ORAL
  Filled 2024-04-11 (×4): qty 1

## 2024-04-11 MED ORDER — ORAL CARE MOUTH RINSE
15.0000 mL | OROMUCOSAL | Status: DC
  Administered 2024-04-11 (×2): 15 mL via OROMUCOSAL

## 2024-04-11 MED ORDER — TAMSULOSIN HCL 0.4 MG PO CAPS
0.4000 mg | ORAL_CAPSULE | Freq: Every day | ORAL | Status: DC
  Administered 2024-04-12 – 2024-04-23 (×12): 0.4 mg via ORAL
  Filled 2024-04-11 (×12): qty 1

## 2024-04-11 MED ORDER — LACTATED RINGERS IV SOLN: INTRAVENOUS | Status: DC

## 2024-04-11 MED ORDER — LACTATED RINGERS IV SOLN: INTRAVENOUS | Status: DC | PRN

## 2024-04-11 MED ORDER — ACETAMINOPHEN 160 MG/5ML PO SOLN: 650.0000 mg | Freq: Once | ORAL | Status: DC

## 2024-04-11 MED ORDER — DEXTROSE 50 % IV SOLN: 0.0000 mL | INTRAVENOUS | Status: DC | PRN

## 2024-04-11 MED ORDER — CHLORHEXIDINE GLUCONATE CLOTH 2 % EX PADS
6.0000 | MEDICATED_PAD | Freq: Every day | CUTANEOUS | Status: DC
  Administered 2024-04-11 – 2024-04-23 (×9): 6 via TOPICAL

## 2024-04-11 MED ORDER — SODIUM CHLORIDE 0.9% IV SOLUTION: Freq: Once | INTRAVENOUS | Status: AC

## 2024-04-11 MED ORDER — SODIUM CHLORIDE 0.9% FLUSH
10.0000 mL | Freq: Two times a day (BID) | INTRAVENOUS | Status: DC
  Administered 2024-04-11: 10 mL
  Administered 2024-04-12: 30 mL
  Administered 2024-04-12 – 2024-04-20 (×15): 10 mL

## 2024-04-11 MED ORDER — SODIUM CHLORIDE (PF) 0.9 % IJ SOLN
OROMUCOSAL | Status: DC | PRN
  Administered 2024-04-11 (×2): 4 mL via TOPICAL

## 2024-04-11 MED ORDER — ONDANSETRON HCL 4 MG/2ML IJ SOLN
4.0000 mg | Freq: Four times a day (QID) | INTRAMUSCULAR | Status: DC | PRN
Start: 2024-04-11 — End: 2024-04-23
  Administered 2024-04-11 – 2024-04-13 (×2): 4 mg via INTRAVENOUS
  Filled 2024-04-11 (×2): qty 2

## 2024-04-11 MED ORDER — PROPOFOL 10 MG/ML IV BOLUS
INTRAVENOUS | Status: DC | PRN
  Administered 2024-04-11: 40 mg via INTRAVENOUS

## 2024-04-11 MED ORDER — SODIUM CHLORIDE 0.9 % IV SOLN: INTRAVENOUS | Status: AC

## 2024-04-11 MED ORDER — MAGNESIUM SULFATE 4 GM/100ML IV SOLN
4.0000 g | Freq: Once | INTRAVENOUS | Status: AC
  Administered 2024-04-11: 4 g via INTRAVENOUS
  Filled 2024-04-11: qty 100

## 2024-04-11 MED ORDER — CHLORHEXIDINE GLUCONATE 0.12 % MT SOLN: 15.0000 mL | Freq: Once | OROMUCOSAL | Status: DC

## 2024-04-11 MED ORDER — FENTANYL CITRATE (PF) 250 MCG/5ML IJ SOLN
INTRAMUSCULAR | Status: DC | PRN
  Administered 2024-04-11 (×2): 100 ug via INTRAVENOUS
  Administered 2024-04-11: 50 ug via INTRAVENOUS
  Administered 2024-04-11: 100 ug via INTRAVENOUS
  Administered 2024-04-11: 200 ug via INTRAVENOUS
  Administered 2024-04-11 (×2): 100 ug via INTRAVENOUS
  Administered 2024-04-11: 50 ug via INTRAVENOUS
  Administered 2024-04-11 (×3): 200 ug via INTRAVENOUS
  Administered 2024-04-11: 100 ug via INTRAVENOUS

## 2024-04-11 MED ORDER — PHENYLEPHRINE HCL-NACL 20-0.9 MG/250ML-% IV SOLN: 0.0000 ug/min | INTRAVENOUS | Status: DC

## 2024-04-11 MED ORDER — PLASMA-LYTE A IV SOLN
INTRAVENOUS | Status: DC | PRN
  Administered 2024-04-11: 500 mL via INTRAVASCULAR

## 2024-04-11 MED ORDER — SODIUM CHLORIDE 0.45 % IV SOLN: INTRAVENOUS | Status: AC | PRN

## 2024-04-11 MED ORDER — BISACODYL 10 MG RE SUPP: 10.0000 mg | Freq: Every day | RECTAL | Status: DC

## 2024-04-11 MED ORDER — ALBUMIN HUMAN 5 % IV SOLN
INTRAVENOUS | Status: DC | PRN
Start: 2024-04-11 — End: 2024-04-11

## 2024-04-11 MED ORDER — METOPROLOL TARTRATE 25 MG/10 ML ORAL SUSPENSION
12.5000 mg | Freq: Two times a day (BID) | ORAL | Status: DC
  Filled 2024-04-11 (×2): qty 5

## 2024-04-11 MED ORDER — PROTAMINE SULFATE 10 MG/ML IV SOLN
INTRAVENOUS | Status: DC | PRN
  Administered 2024-04-11: 50 mg via INTRAVENOUS
  Administered 2024-04-11: 260 mg via INTRAVENOUS

## 2024-04-11 MED ORDER — CEFAZOLIN SODIUM-DEXTROSE 2-4 GM/100ML-% IV SOLN
2.0000 g | Freq: Three times a day (TID) | INTRAVENOUS | Status: AC
  Administered 2024-04-11 – 2024-04-13 (×6): 2 g via INTRAVENOUS
  Filled 2024-04-11 (×6): qty 100

## 2024-04-11 MED ORDER — GABAPENTIN 250 MG/5ML PO SOLN
200.0000 mg | Freq: Every day | ORAL | Status: DC
  Administered 2024-04-11 – 2024-04-14 (×4): 200 mg
  Filled 2024-04-11 (×4): qty 4

## 2024-04-11 MED ORDER — ACETAMINOPHEN 500 MG PO TABS
1000.0000 mg | ORAL_TABLET | Freq: Four times a day (QID) | ORAL | Status: AC
  Administered 2024-04-12 – 2024-04-16 (×18): 1000 mg via ORAL
  Filled 2024-04-11 (×19): qty 2

## 2024-04-11 MED ORDER — METOCLOPRAMIDE HCL 5 MG/ML IJ SOLN
10.0000 mg | Freq: Four times a day (QID) | INTRAMUSCULAR | Status: AC
  Administered 2024-04-11 – 2024-04-13 (×6): 10 mg via INTRAVENOUS
  Filled 2024-04-11 (×6): qty 2

## 2024-04-11 MED ORDER — METOPROLOL TARTRATE 5 MG/5ML IV SOLN: 2.5000 mg | INTRAVENOUS | Status: DC | PRN

## 2024-04-11 MED ORDER — PROTAMINE SULFATE 10 MG/ML IV SOLN
INTRAVENOUS | Status: AC
  Filled 2024-04-11: qty 5

## 2024-04-11 MED ORDER — ORAL CARE MOUTH RINSE: 15.0000 mL | Freq: Once | OROMUCOSAL | Status: DC

## 2024-04-11 MED ORDER — PROTAMINE SULFATE 10 MG/ML IV SOLN
INTRAVENOUS | Status: AC
  Filled 2024-04-11: qty 25

## 2024-04-11 MED ORDER — OXYCODONE HCL 5 MG PO TABS
5.0000 mg | ORAL_TABLET | ORAL | Status: DC | PRN
  Administered 2024-04-12 (×2): 10 mg via ORAL
  Administered 2024-04-12 – 2024-04-19 (×2): 5 mg via ORAL
  Filled 2024-04-11: qty 2
  Filled 2024-04-11: qty 1
  Filled 2024-04-11: qty 2
  Filled 2024-04-11: qty 1
  Filled 2024-04-11: qty 2

## 2024-04-11 MED ORDER — SODIUM CHLORIDE 0.9% FLUSH: 3.0000 mL | INTRAVENOUS | Status: DC | PRN

## 2024-04-11 MED ORDER — NOREPINEPHRINE 4 MG/250ML-% IV SOLN: 0.0000 ug/min | INTRAVENOUS | Status: DC

## 2024-04-11 MED ORDER — DEXMEDETOMIDINE HCL IN NACL 400 MCG/100ML IV SOLN: 0.0000 ug/kg/h | INTRAVENOUS | Status: DC

## 2024-04-11 MED ORDER — PHENYLEPHRINE 80 MCG/ML (10ML) SYRINGE FOR IV PUSH (FOR BLOOD PRESSURE SUPPORT)
PREFILLED_SYRINGE | INTRAVENOUS | Status: AC
  Filled 2024-04-11: qty 10

## 2024-04-11 MED ORDER — VANCOMYCIN HCL IN DEXTROSE 1-5 GM/200ML-% IV SOLN
1000.0000 mg | Freq: Once | INTRAVENOUS | Status: AC
Start: 2024-04-11 — End: 2024-04-11
  Administered 2024-04-11: 1000 mg via INTRAVENOUS
  Filled 2024-04-11: qty 200

## 2024-04-11 MED ORDER — PANTOPRAZOLE SODIUM 40 MG IV SOLR
40.0000 mg | Freq: Every day | INTRAVENOUS | Status: AC
  Administered 2024-04-11 – 2024-04-12 (×2): 40 mg via INTRAVENOUS
  Filled 2024-04-11 (×2): qty 10

## 2024-04-11 MED ORDER — SODIUM CHLORIDE 0.9% FLUSH
3.0000 mL | Freq: Two times a day (BID) | INTRAVENOUS | Status: DC
  Administered 2024-04-12 (×2): 3 mL via INTRAVENOUS

## 2024-04-11 MED ORDER — FENTANYL CITRATE (PF) 250 MCG/5ML IJ SOLN
INTRAMUSCULAR | Status: AC
Start: 2024-04-11 — End: 2024-04-11
  Filled 2024-04-11: qty 5

## 2024-04-11 MED ORDER — PROPOFOL 10 MG/ML IV BOLUS
INTRAVENOUS | Status: AC
  Filled 2024-04-11: qty 20

## 2024-04-11 MED ORDER — ROCURONIUM BROMIDE 10 MG/ML (PF) SYRINGE
PREFILLED_SYRINGE | INTRAVENOUS | Status: DC | PRN
  Administered 2024-04-11: 20 mg via INTRAVENOUS
  Administered 2024-04-11: 100 mg via INTRAVENOUS
  Administered 2024-04-11: 20 mg via INTRAVENOUS

## 2024-04-11 MED ORDER — ALPRAZOLAM 0.5 MG PO TABS
0.5000 mg | ORAL_TABLET | Freq: Every day | ORAL | Status: DC
  Administered 2024-04-11 – 2024-04-22 (×12): 0.5 mg via ORAL
  Filled 2024-04-11 (×12): qty 1

## 2024-04-11 MED ORDER — DOCUSATE SODIUM 50 MG/5ML PO LIQD: 200.0000 mg | Freq: Every day | ORAL | Status: DC

## 2024-04-11 MED ORDER — HEPARIN SODIUM (PORCINE) 1000 UNIT/ML IJ SOLN
INTRAMUSCULAR | Status: AC
  Filled 2024-04-11: qty 1

## 2024-04-11 MED ORDER — ROCURONIUM BROMIDE 10 MG/ML (PF) SYRINGE
PREFILLED_SYRINGE | INTRAVENOUS | Status: AC
  Filled 2024-04-11: qty 10

## 2024-04-11 MED ORDER — SODIUM CHLORIDE 0.9% FLUSH: 10.0000 mL | INTRAVENOUS | Status: DC | PRN

## 2024-04-11 MED ORDER — EPHEDRINE 5 MG/ML INJ
INTRAVENOUS | Status: AC
  Filled 2024-04-11: qty 5

## 2024-04-11 MED ORDER — ALBUMIN HUMAN 5 % IV SOLN
250.0000 mL | INTRAVENOUS | Status: DC | PRN
  Administered 2024-04-11: 12.5 g via INTRAVENOUS

## 2024-04-11 MED ORDER — ASPIRIN 81 MG PO CHEW: 324.0000 mg | CHEWABLE_TABLET | Freq: Once | ORAL | Status: DC

## 2024-04-11 MED ORDER — ASPIRIN 325 MG PO TBEC
325.0000 mg | DELAYED_RELEASE_TABLET | Freq: Every day | ORAL | Status: DC
  Administered 2024-04-12 – 2024-04-23 (×11): 325 mg via ORAL
  Filled 2024-04-11 (×12): qty 1

## 2024-04-11 MED ORDER — INSULIN REGULAR(HUMAN) IN NACL 100-0.9 UT/100ML-% IV SOLN: INTRAVENOUS | Status: DC

## 2024-04-11 MED ORDER — ORAL CARE MOUTH RINSE: 15.0000 mL | OROMUCOSAL | Status: DC | PRN

## 2024-04-11 MED ORDER — MIDAZOLAM HCL (PF) 5 MG/ML IJ SOLN
INTRAMUSCULAR | Status: DC | PRN
  Administered 2024-04-11 (×3): 1 mg via INTRAVENOUS
  Administered 2024-04-11: 3 mg via INTRAVENOUS
  Administered 2024-04-11: 1 mg via INTRAVENOUS

## 2024-04-11 MED ORDER — ACETAMINOPHEN 160 MG/5ML PO SOLN: 1000.0000 mg | Freq: Four times a day (QID) | ORAL | Status: AC

## 2024-04-11 MED ORDER — SODIUM CHLORIDE 0.9 % IV SOLN: 250.0000 mL | INTRAVENOUS | Status: AC

## 2024-04-11 MED ORDER — MUPIROCIN 2 % EX OINT: 1.0000 | TOPICAL_OINTMENT | Freq: Two times a day (BID) | CUTANEOUS | Status: DC

## 2024-04-11 MED ORDER — MIDAZOLAM HCL 2 MG/2ML IJ SOLN
2.0000 mg | INTRAMUSCULAR | Status: DC | PRN
  Administered 2024-04-11: 2 mg via INTRAVENOUS
  Filled 2024-04-11: qty 2

## 2024-04-11 MED ORDER — HEPARIN SODIUM (PORCINE) 1000 UNIT/ML IJ SOLN
INTRAMUSCULAR | Status: DC | PRN
  Administered 2024-04-11: 26000 [IU] via INTRAVENOUS

## 2024-04-11 MED ORDER — PANTOPRAZOLE SODIUM 40 MG PO TBEC
40.0000 mg | DELAYED_RELEASE_TABLET | Freq: Every day | ORAL | Status: DC
  Administered 2024-04-13 – 2024-04-23 (×11): 40 mg via ORAL
  Filled 2024-04-11 (×11): qty 1

## 2024-04-11 MED ORDER — ORAL CARE MOUTH RINSE
15.0000 mL | OROMUCOSAL | Status: DC | PRN
Start: 2024-04-11 — End: 2024-04-23

## 2024-04-11 MED ORDER — ARTIFICIAL TEARS OPHTHALMIC OINT
TOPICAL_OINTMENT | OPHTHALMIC | Status: DC | PRN
  Administered 2024-04-11: 1 via OPHTHALMIC

## 2024-04-11 MED ORDER — MORPHINE SULFATE (PF) 2 MG/ML IV SOLN
1.0000 mg | INTRAVENOUS | Status: DC | PRN
  Administered 2024-04-11: 1 mg via INTRAVENOUS
  Administered 2024-04-11 – 2024-04-12 (×2): 2 mg via INTRAVENOUS
  Filled 2024-04-11 (×3): qty 1

## 2024-04-11 MED ORDER — ASPIRIN 81 MG PO CHEW
324.0000 mg | CHEWABLE_TABLET | Freq: Every day | ORAL | Status: DC
  Filled 2024-04-11 (×2): qty 4

## 2024-04-11 MED ORDER — MIDAZOLAM HCL (PF) 10 MG/2ML IJ SOLN
INTRAMUSCULAR | Status: AC
  Filled 2024-04-11: qty 2

## 2024-04-11 MED ORDER — 0.9 % SODIUM CHLORIDE (POUR BTL) OPTIME
TOPICAL | Status: DC | PRN
  Administered 2024-04-11: 5000 mL

## 2024-04-11 MED ORDER — CHLORHEXIDINE GLUCONATE 0.12 % MT SOLN
15.0000 mL | OROMUCOSAL | Status: AC
  Administered 2024-04-11: 15 mL via OROMUCOSAL
  Filled 2024-04-11: qty 15

## 2024-04-11 MED ORDER — POTASSIUM CHLORIDE 10 MEQ/50ML IV SOLN
10.0000 meq | INTRAVENOUS | Status: AC
  Administered 2024-04-11 (×3): 10 meq via INTRAVENOUS

## 2024-04-11 SURGICAL SUPPLY — 73 items
ADAPTER MULTI PERFUSION 15 (ADAPTER) ×2 IMPLANT
BAG DECANTER FOR FLEXI CONT (MISCELLANEOUS) ×2 IMPLANT
BLADE CLIPPER SURG (BLADE) ×2 IMPLANT
BLADE STERNUM SYSTEM 6 (BLADE) ×2 IMPLANT
BNDG ELASTIC 4INX 5YD STR LF (GAUZE/BANDAGES/DRESSINGS) IMPLANT
BNDG ELASTIC 4X5.8 VLCR STR LF (GAUZE/BANDAGES/DRESSINGS) ×2 IMPLANT
BNDG ELASTIC 6INX 5YD STR LF (GAUZE/BANDAGES/DRESSINGS) ×2 IMPLANT
BNDG GAUZE DERMACEA FLUFF 4 (GAUZE/BANDAGES/DRESSINGS) ×2 IMPLANT
CANISTER SUCTION 3000ML PPV (SUCTIONS) ×2 IMPLANT
CANNULA AORTIC ROOT 9FR (CANNULA) ×2 IMPLANT
CANNULA MC2 2 STG 29/37 NON-V (CANNULA) ×2 IMPLANT
CANNULA NON VENT 20FR 12 (CANNULA) ×2 IMPLANT
CATH ROBINSON RED A/P 18FR (CATHETERS) ×4 IMPLANT
CONNECTOR BLAKE 2:1 CARIO BLK (MISCELLANEOUS) ×2 IMPLANT
CONTAINER PROTECT SURGISLUSH (MISCELLANEOUS) ×4 IMPLANT
COUNTER NDL 20CT MAGNET RED (NEEDLE) IMPLANT
DRAIN CHANNEL 19F RND (DRAIN) ×6 IMPLANT
DRAIN CONNECTOR BLAKE 1:1 (MISCELLANEOUS) IMPLANT
DRAPE INCISE IOBAN 66X45 STRL (DRAPES) IMPLANT
DRAPE SRG 135X102X78XABS (DRAPES) ×2 IMPLANT
DRAPE WARM FLUID 44X44 (DRAPES) ×2 IMPLANT
DRESSING AQUACEL AG SP 3.5X10 (GAUZE/BANDAGES/DRESSINGS) IMPLANT
DRSG AQUACEL AG ADV 3.5X10 (GAUZE/BANDAGES/DRESSINGS) ×2 IMPLANT
ELECTRODE BLDE 4.0 EZ CLN MEGD (MISCELLANEOUS) ×2 IMPLANT
ELECTRODE REM PT RTRN 9FT ADLT (ELECTROSURGICAL) ×4 IMPLANT
FELT TEFLON 1X6 (MISCELLANEOUS) ×4 IMPLANT
GAUZE SPONGE 4X4 12PLY STRL (GAUZE/BANDAGES/DRESSINGS) ×4 IMPLANT
GLOVE BIO SURGEON STRL SZ7 (GLOVE) ×4 IMPLANT
GLOVE BIOGEL M STRL SZ7.5 (GLOVE) ×4 IMPLANT
GLOVE SS BIOGEL STRL SZ 6 (GLOVE) IMPLANT
GOWN STRL REUS W/ TWL LRG LVL3 (GOWN DISPOSABLE) ×8 IMPLANT
GOWN STRL REUS W/ TWL XL LVL3 (GOWN DISPOSABLE) ×4 IMPLANT
HEMOSTAT POWDER SURGIFOAM 1G (HEMOSTASIS) ×4 IMPLANT
INSERT SUTURE HOLDER (MISCELLANEOUS) ×2 IMPLANT
KIT BASIN OR (CUSTOM PROCEDURE TRAY) ×2 IMPLANT
KIT TURNOVER KIT B (KITS) ×2 IMPLANT
KIT VASOVIEW HEMOPRO 2 VH 4000 (KITS) ×2 IMPLANT
LEAD PACING MYOCARDI (MISCELLANEOUS) ×2 IMPLANT
LINE VENT (MISCELLANEOUS) IMPLANT
MARKER DISTAL GRAFT W/ HOLDER (MISCELLANEOUS) ×6 IMPLANT
NS IRRIG 1000ML POUR BTL (IV SOLUTION) ×10 IMPLANT
PACK E OPEN HEART (SUTURE) ×2 IMPLANT
PACK OPEN HEART (CUSTOM PROCEDURE TRAY) ×2 IMPLANT
PAD ARMBOARD POSITIONER FOAM (MISCELLANEOUS) ×4 IMPLANT
PAD ELECT DEFIB RADIOL ZOLL (MISCELLANEOUS) ×2 IMPLANT
PENCIL BUTTON HOLSTER BLD 10FT (ELECTRODE) ×2 IMPLANT
POSITIONER HEAD DONUT 9IN (MISCELLANEOUS) ×2 IMPLANT
PUNCH AORTIC ROTATE 4.0MM (MISCELLANEOUS) ×2 IMPLANT
SET MPS 3-ND DEL (MISCELLANEOUS) IMPLANT
SOLUTION ANTFG W/FOAM PAD STRL (MISCELLANEOUS) IMPLANT
SUPPORT HEART JANKE-BARRON (MISCELLANEOUS) ×2 IMPLANT
SUT ETHIBOND X763 2 0 SH 1 (SUTURE) ×4 IMPLANT
SUT ETHILON 3 0 PS 1 (SUTURE) IMPLANT
SUT MNCRL AB 3-0 PS2 18 (SUTURE) ×4 IMPLANT
SUT MNCRL AB 4-0 PS2 18 (SUTURE) IMPLANT
SUT PDS AB 1 CTX 36 (SUTURE) ×4 IMPLANT
SUT PROLENE 4 0 SH DA (SUTURE) ×2 IMPLANT
SUT PROLENE 5 0 C 1 36 (SUTURE) ×6 IMPLANT
SUT PROLENE 7 0 BV 1 (SUTURE) IMPLANT
SUT PROLENE 7 0 BV1 MDA (SUTURE) ×2 IMPLANT
SUT STEEL 6MS V (SUTURE) ×4 IMPLANT
SUT VIC AB 1 CTX36XBRD ANBCTR (SUTURE) IMPLANT
SYSTEM SAHARA CHEST DRAIN ATS (WOUND CARE) ×2 IMPLANT
TAPE CLOTH 4X10 WHT NS (GAUZE/BANDAGES/DRESSINGS) IMPLANT
TAPE PAPER 2X10 WHT MICROPORE (GAUZE/BANDAGES/DRESSINGS) IMPLANT
TOWEL GREEN STERILE (TOWEL DISPOSABLE) ×2 IMPLANT
TOWEL GREEN STERILE FF (TOWEL DISPOSABLE) ×2 IMPLANT
TRAY FOLEY SLVR 14FR TEMP STAT (SET/KITS/TRAYS/PACK) IMPLANT
TUBE SUCT INTRACARD DLP 20F (MISCELLANEOUS) ×2 IMPLANT
TUBE SUCTION CARDIAC 10FR (CANNULA) ×2 IMPLANT
TUBING LAP HI FLOW INSUFFLATIO (TUBING) ×2 IMPLANT
UNDERPAD 30X36 HEAVY ABSORB (UNDERPADS AND DIAPERS) ×2 IMPLANT
WATER STERILE IRR 1000ML POUR (IV SOLUTION) ×4 IMPLANT

## 2024-04-11 NOTE — Anesthesia Procedure Notes (Signed)
 Procedure Name: Intubation Date/Time: 04/11/2024 11:25 AM  Performed by: Loreli Blima LABOR, CRNAPre-anesthesia Checklist: Patient identified, Emergency Drugs available, Suction available and Patient being monitored Patient Re-evaluated:Patient Re-evaluated prior to induction Oxygen Delivery Method: Circle System Utilized Preoxygenation: Pre-oxygenation with 100% oxygen Induction Type: IV induction Ventilation: Mask ventilation without difficulty Laryngoscope Size: Mac and 4 Grade View: Grade I Tube type: Oral Tube size: 8.0 mm Number of attempts: 1 Airway Equipment and Method: Stylet Placement Confirmation: ETT inserted through vocal cords under direct vision, positive ETCO2 and breath sounds checked- equal and bilateral Secured at: 25 cm Tube secured with: Tape Dental Injury: Teeth and Oropharynx as per pre-operative assessment

## 2024-04-11 NOTE — Plan of Care (Signed)
  Problem: Clinical Measurements: Goal: Ability to maintain clinical measurements within normal limits will improve Outcome: Progressing Goal: Will remain free from infection Outcome: Progressing Goal: Diagnostic test results will improve Outcome: Progressing Goal: Respiratory complications will improve Outcome: Progressing Goal: Cardiovascular complication will be avoided Outcome: Progressing   Problem: Activity: Goal: Risk for activity intolerance will decrease Outcome: Progressing   Problem: Elimination: Goal: Will not experience complications related to bowel motility Outcome: Progressing Goal: Will not experience complications related to urinary retention Outcome: Progressing   Problem: Pain Managment: Goal: General experience of comfort will improve and/or be controlled Outcome: Progressing   Problem: Safety: Goal: Ability to remain free from injury will improve Outcome: Progressing   Problem: Skin Integrity: Goal: Risk for impaired skin integrity will decrease Outcome: Progressing   Problem: Activity: Goal: Ability to return to baseline activity level will improve Outcome: Progressing   Problem: Cardiovascular: Goal: Ability to achieve and maintain adequate cardiovascular perfusion will improve Outcome: Progressing Goal: Vascular access site(s) Level 0-1 will be maintained Outcome: Progressing

## 2024-04-11 NOTE — Brief Op Note (Signed)
 04/10/2024 - 04/11/2024  7:19 AM  PATIENT:  Jesse [name]  83 y.o. male  PRE-OPERATIVE DIAGNOSIS:  coronary artery disease  POST-OPERATIVE DIAGNOSIS:  coronary artery disease  PROCEDURE:  Procedure(s):  CORONARY ARTERY BYPASS GRAFTING x 3 -LIMA to LAD -SVG to OM -SVG to PDA  INTRAOPERATIVE TRANSESOPHAGEAL ECHO  ENDOSCOPIC HARVEST GREATER SAPHENOUS VEIN -Right Leg Vein harvest time: 40 min Vein prep time: 10 min  SURGEON:  Surgeons and Role:    * Lightfoot, Linnie KIDD, MD - Primary  PHYSICIAN ASSISTANT: Rocky Shad PA-C  ASSISTANTS: Evalene Domino RNFA   ANESTHESIA:   general  EBL:  500 mL   BLOOD ADMINISTERED:  CC CELLSAVER,   FFP, and 2 PLTS  DRAINS: Left Pleural Chest Tube, Mediastinal Chest Drain   LOCAL MEDICATIONS USED:  NONE  SPECIMEN:  No Specimen  DISPOSITION OF SPECIMEN:  N/A  COUNTS:  YES  TOURNIQUET:  * No tourniquets in log *  DICTATION: .Dragon Dictation  PLAN OF CARE: Admit to inpatient   PATIENT DISPOSITION:  ICU - intubated and hemodynamically stable.   Delay start of Pharmacological VTE agent (>24hrs) due to surgical blood loss or risk of bleeding: yes

## 2024-04-11 NOTE — Hospital Course (Addendum)
 History of Present Illness:  Jesse [name] is an 83 yo male with history of Polycythemia Vera, HTN, and HLD.  He presented to the Emergency Department via EMS on 7/19 with complaints of chest pain.  EKG obtained en route showed ST elevation in AVR/V1 with reciprocal changes present.  Due to this Code STEMI was initiated.  He was treated NTG with improvement of pain.  He also admitted to having some mild nausea.  Upon arrival to the ED repeat CXR was obtained and showed NSR with continued ST elevation in AVR, but there were no longer reciprocal changes.  Cardiac markers were positive.  Cardiology consult was obtained who recommended emergent cardiac catheterization.  This was performed by Dr. Ladona and showed multivessel CAD with heavy calcifications the left main.  The patient was felt to be too high risk for PCI.  The patient's chest pain resolved and it was felt he would be best served by coronary bypass grafting procedure.  He was treated with Aggrastat , IV heparin  and surgery consultation was requested.  Hospital Course:  The patient was admitted from the cath lab.  He remained chest pain free since admission.  Echocardiogram showed preserved function and no significant valvular disease.  He was evaluated by Dr. Shyrl who was in agreement the patient would benefit from coronary bypass grafting procedure.  The risks and benefits were explained to the patient and he was agreeable to proceed.  He was taken to the operating room on 04/11/24 and underwent CABG x 3 utilizing LIMA to LAD, SVG to OM, and SVG to PDA.  He also underwent endoscopic harvest of greater saphenous vein from his right leg.  Due to preoperative Aggrastat  administration he required transfusion of platelets and FFP.  He tolerated the procedure and was taken to the SICU in stable condition.  The patient was extubated the evening of surgery.  The patient was weaned off Levophed  and Neo-Synephrine as hemodynamics allowed.  He was maintaining  NSR and his pacing wires were removed without difficulty.  He was started on Lasix  to help facilitate diuresis.  He has chronic hyponatremia which was monitored closely.  His potassium was borderline at 3.6 and he was supplemented with a goal of 4.0.  PT/OT evaluations were obtained and they recommended SNF but the patient refused and sated he preferred home health PT/OT.  He is maintaining NSR and was felt stable for transfer to the progressive unit on 04/13/2024. He became orthostatic, Lasix  and potassium was discontinued since the patient was at his preoperative weight.  The patient developed tachycardia with PACs.  His BP did improve with more time. He was very deconditioned. Once ambulation was improved, he was felt stable for discharge. The patient continued to refused SNF.  Home health was arranged. He continued to have orthostatic hypotension, midodrine  was started. He continued to work with PT/OT.

## 2024-04-11 NOTE — Procedures (Signed)
 Extubation Procedure Note  Patient Details:   Name: Jesse [name] DOB: 1941/01/17 MRN: [id number]   Airway Documentation:    Vent end date: 04/11/24 Vent end time: 2049   Evaluation  O2 sats: stable throughout Complications: No apparent complications Patient did tolerate procedure well. Bilateral Breath Sounds: Diminished, Other (Comment) (coarse)   Yes  Pt extubated to 2L nasal cannula per rapid wean protocol with no complications. NIF -30, VC 1.9L, positive cuff leak noted prior to extubation.    Pt currently resting comfortably, HR 74 SpO2 96%, RR 16.  Damien FORBES Rummer 04/11/2024, 8:50 PM

## 2024-04-11 NOTE — Anesthesia Procedure Notes (Signed)
 Central Venous Catheter Insertion Performed by: Darlyn Rush, MD, anesthesiologist Start/End7/20/2025 10:48 AM, 04/11/2024 11:03 AM Patient location: Pre-op. Preanesthetic checklist: patient identified, IV checked, site marked, risks and benefits discussed, surgical consent, monitors and equipment checked, pre-op evaluation, timeout performed and anesthesia consent Position: Trendelenburg Lidocaine  1% used for infiltration and patient sedated Hand hygiene performed  and maximum sterile barriers used  Catheter size: 8.5 Fr Sheath introducer Procedure performed using ultrasound guided technique. Ultrasound Notes:anatomy identified, needle tip was noted to be adjacent to the nerve/plexus identified, no ultrasound evidence of intravascular and/or intraneural injection and image(s) printed for medical record Attempts: 1 Following insertion, line sutured, dressing applied and Biopatch. Post procedure assessment: blood return through all ports, free fluid flow and no air  Patient tolerated the procedure well with no immediate complications. Additional procedure comments: Triple lumen catheter placed through introducer port. [name]

## 2024-04-11 NOTE — Op Note (Signed)
 [address]       Jesse [name] [id number]             [phone]                                          04/11/2024 Patient:  Jesse [name] Pre-Op Dx: STEMI 3V CAD   HTN HLP Polycythemia vera  Post-op Dx:  same Procedure: CABG X 3.  LIMA LAD, RSVG PDA, OM   Endoscopic greater saphenous vein harvest on the right   Surgeon and Role:      * Dixie Coppa, Linnie KIDD, MD - Primary    * E. Barrett , PA-C - assisting An experienced assistant was required given the complexity of this surgery and the standard of surgical care. The assistant was needed for exposure, dissection, suctioning, retraction of delicate tissues and sutures, instrument exchange and for overall help during this procedure.    Anesthesia  general EBL:  500ml Blood Administration: none Xclamp Time:  41 min Pump Time:   Drains: 47 F blake drain: L, mediastinal  Wires: V Counts: correct   Indications: 83 y.o. male with 3V CAD and presents with a STEMI.  Echocardiogram shows preserved biventricular function and no significant valvular disease.  The risks and benefits of CABG were discussed in detail.  The patient is  agreeable to proceed.   Findings: Good vein and LIMA.  Good PDA.  Heavily calcified LAD and OM  Operative Technique: All invasive lines were placed in pre-op holding.  After the risks, benefits and alternatives were thoroughly discussed, the patient was brought to the operative theatre.  Anesthesia was induced, and the patient was prepped and draped in normal sterile fashion.  An appropriate surgical pause was performed, and pre-operative antibiotics were dosed accordingly.  We began with simultaneous incisions along the right leg for harvesting of the greater saphenous vein and the chest for the sternotomy.  In regards to the sternotomy, this was carried down with bovie cautery, and the sternum was divided with a reciprocating saw.  Meticulous hemostasis was obtained.  The  left internal thoracic artery was exposed and harvested in in pedicled fashion.  The patient was systemically heparinized, and the artery was divided distally, and placed in a papaverine  sponge.    The sternal elevator was removed, and a retractor was placed.  The pericardium was divided in the midline and fashioned into a cradle with pericardial stitches.   After we confirmed an appropriate ACT, the ascending aorta was cannulated in standard fashion.  The right atrial appendage was used for venous cannulation site.  Cardiopulmonary bypass was initiated, and the heart retractor was placed. The cross clamp was applied, and a dose of anterograde cardioplegia was given with good arrest of the heart.  We moved to the posterior wall of the heart, and found a good target on the PDA.  An arteriotomy was made, and the vein graft was anastomosed to it in an end to side fashion.  Next we exposed the lateral wall, and found a good target on the OM.  An end to side anastomosis with the vein graft was then created.  Finally, we exposed a good target on the  LAD, and fashioned an end to side anastomosis between it and the LITA.  We began to re-warm, and a re-animation dose of cardioplegia was  given.  The heart was de-aired, and the cross clamp was removed.  Meticulous hemostasis was obtained.    A partial occludding clamp was then placed on the ascending aorta, and we created an end to side anastomosis between it and the proximal vein grafts.  Rings were placed on the proximal anastomosis.  Hemostasis was obtained, and we separated from cardiopulmonary bypass without event.  The heparin  was reversed with protamine .  Chest tubes and wires were placed, and the sternum was re-approximated with sternal wires.  The soft tissue and skin were re-approximated wth absorbable suture.    The patient tolerated the procedure without any immediate complications, and was transferred to the ICU in guarded condition.  Jesse [name]

## 2024-04-11 NOTE — Transfer of Care (Signed)
 Immediate Anesthesia Transfer of Care Note  Patient: Jesse [name]  Procedure(s) Performed: CORONARY ARTERY BYPASS GRAFTING (CABG) X THREE, USING LEFT INTERNAL MAMMARY ARTERY AND RIGHT LEG GREATER SAPHENOUS VEIN HARVESTED ENDOSCOPICALLY (Chest) ECHOCARDIOGRAM, TRANSESOPHAGEAL, INTRAOPERATIVE  Patient Location: ICU  Anesthesia Type:General  Level of Consciousness: Patient remains intubated per anesthesia plan  Airway & Oxygen Therapy: Patient remains intubated per anesthesia plan and Patient placed on Ventilator (see vital sign flow sheet for setting)  Post-op Assessment: Report given to RN and Post -op Vital signs reviewed and stable  Post vital signs: Reviewed and stable  Last Vitals:  Vitals Value Taken Time  BP 98/64 04/11/24 15:55  Temp 35.6 C 04/11/24 15:58  Pulse 64 04/11/24 15:58  Resp 17 04/11/24 15:58  SpO2 98 % 04/11/24 15:58  Vitals shown include unfiled device data.  Last Pain:  Vitals:   04/11/24 1016  TempSrc:   PainSc: 0-No pain      Patients Stated Pain Goal: 0 (04/11/24 0450)  Complications: No notable events documented.

## 2024-04-11 NOTE — Consult Note (Signed)
 NAME:  Jesse [name], MRN:  [id number], DOB:  [birth year], LOS: 1 ADMISSION DATE:  04/10/2024, CONSULTATION DATE: 04/11/2024 REFERRING MD: Dr. Shyrl, CHIEF COMPLAINT: Chest pain  History of Present Illness:  83 year old male with hypertension, hyperlipidemia, polycythemia vera status post phlebectomy last time on 7/15 who presented with chest pain on 7/19, was admitted with acute NSTEMI.  Patient underwent cardiac catheterization which showed multivessel coronary artery disease, today he underwent CABG x 3.  Patient remained intubated, was transferred to ICU, PCCM was consulted for evaluation and medical management  EBL:  Blood Administration: none Xclamp Time:  41 min Pump Time:   Pertinent  Medical History   Past Medical History:  Diagnosis Date   Depression    Hypercholesteremia    Hypertension    HZ (herpes zoster) 09/02/2011   Neuritis    left foot   Polycythemia rubra vera (HCC) 09/02/2011     Significant Hospital Events: Including procedures, antibiotic start and stop dates in addition to other pertinent events     Interim History / Subjective:  As above  Objective    Blood pressure (!) 115/51, pulse 65, temperature 98.1 F (36.7 C), temperature source Oral, resp. rate 15, height 5' 6 (1.676 m), weight 64.5 kg, SpO2 97%.    Vent Mode: SIMV;PRVC;PSV FiO2 (%):  [50 %] 50 % Set Rate:  [115 bmp] 115 bmp Vt Set:  [510 mL] 510 mL Pressure Support:  [10 cmH20] 10 cmH20 Plateau Pressure:  [13 cmH20] 13 cmH20   Intake/Output Summary (Last 24 hours) at 04/11/2024 1608 Last data filed at 04/11/2024 1530 Gross per 24 hour  Intake 2733.1 ml  Output 3350 ml  Net -616.9 ml   Filed Weights   04/11/24 0840  Weight: 64.5 kg    Examination: General: Crtitically ill-appearing male, orally intubated HEENT: [place]/AT, eyes anicteric.  ETT and cortrak in place Neuro: Sedated, not following commands.  Eyes are closed.  Pupils 3 mm bilateral reactive to  light Chest: Central sternotomy incision looks clean and dry, coarse breath sounds, no wheezes or rhonchi.  Mediastinal, chest tube and pacer wires in place Heart: Regular rate and rhythm, no murmurs or gallops Abdomen: Soft, nondistended, bowel sounds present  Labs and images reviewed  Patient Lines/Drains/Airways Status     Active Line/Drains/Airways     Name Placement date Placement time Site Days   Arterial Line 04/11/24 Left Radial 04/11/24  1040  Radial  less than 1   Peripheral IV 04/10/24 18 G Anterior;Distal;Right;Upper Arm 04/10/24  1212  Arm  1   Peripheral IV 04/10/24 18 G Anterior;Left Forearm 04/10/24  1213  Forearm  1   NG/OG Vented/Dual Lumen Oral 04/11/24  1520  Oral  less than 1   Urethral Catheter M. Rebecka, RN Latex;Temperature probe 14 Fr. 04/11/24  1125  Latex;Temperature probe  less than 1   Y Chest Tube 1 and 2 1 Left Pleural 19 Fr. 2 Anterior Mediastinal 19 Fr. 04/11/24  1443  -- less than 1   Airway 8 mm 04/11/24  1125  -- less than 1   Wound 04/11/24 1224 Surgical Closed Surgical Incision Leg Right 04/11/24  1224  Leg  less than 1   Wound 04/11/24 1224 Surgical Closed Surgical Incision Chest Other (Comment) 04/11/24  1224  Chest  less than 1         Resolved problem list   Assessment and Plan  Acute NSTEMI Multivessel coronary artery disease s/p CABG x 3 Continue aspirin   and statin Chest tube management TCTS Continue to titrate Precedex  with RASS goal 0/-1 Continue pain control with tramadol, oxycodone  and morphine  Closely monitor chest tube output  Acute respiratory insufficiency, postop Continue on protective ventilation VAP prevention bundle in place Rapid weaning protocol ordered is in place  Chronic HFpEF Monitor intake and output EF 60 to 65% with grade 1 diastolic dysfunction GDMT once able to tolerate  Hypertension Holding antihypertensive for now, as patient is requiring low-dose Levophed   Hyperlipidemia Continue  atorvastatin   Expected perioperative blood loss anemia Monitor H/H and PLT counts  Polycythemia vera Last phlebotomy was on 7/15, outpatient follow-up with hematology   Best Practice (right click and Reselect all SmartList Selections daily)   Diet/type: NPO DVT prophylaxis: SCD GI prophylaxis: PPI Lines: Central line, Arterial Line, and yes and it is still needed Foley:  Yes, and it is still needed Code Status:  full code Last date of multidisciplinary goals of care discussion [Per primary team]   Labs   CBC: Recent Labs  Lab 04/06/24 1103 04/06/24 1103 04/10/24 1255 04/11/24 0428 04/11/24 1144 04/11/24 1404 04/11/24 1425 04/11/24 1441 04/11/24 1445 04/11/24 1556  WBC 6.1  --  7.2 9.7  --   --   --   --   --   --   NEUTROABS 3.6  --  5.2  --   --   --   --   --   --   --   HGB 17.2*   < > 13.1 14.9   < > 9.4*  9.2* 9.2* 9.5* 9.5* 10.2*  HCT 52.1*  --  38.0* 43.8   < > 27.7*  27.0* 27.0* 28.0* 28.0* 30.0*  MCV 95.2  --  93.4 94.2  --   --   --   --   --   --   PLT 223  --  213 243  --  204  --   --   --   --    < > = values in this interval not displayed.    Basic Metabolic Panel: Recent Labs  Lab 04/06/24 1103 04/06/24 1103 04/10/24 1255 04/11/24 0428 04/11/24 1144 04/11/24 1252 04/11/24 1323 04/11/24 1327 04/11/24 1404 04/11/24 1425 04/11/24 1441 04/11/24 1445 04/11/24 1556  NA 136  --  132* 133*   < > 134*   < > 137 134* 134* 136 136 136  K 4.8  --  3.3* 4.6   < > 4.1   < > 4.9 5.1 4.9 4.2 4.3 3.9  CL 102  --  104 106   < > 104  --  98 103 104  --  107  --   CO2 27  --  19* 19*  --   --   --   --   --   --   --   --   --   GLUCOSE 105*  --  146* 93   < > 120*  --  100* 134* 132*  --  121*  --   BUN 17  --  14 10   < > 10  --  8 10 10   --  9  --   CREATININE 0.82   < > 0.75 0.65   < > 0.50*  --  0.30* 0.40* 0.40*  --  0.40*  --   CALCIUM  10.5*  --  8.8* 9.2  --   --   --   --   --   --   --   --   --  MG  --   --  1.8  --   --   --   --   --    --   --   --   --   --    < > = values in this interval not displayed.   GFR: Estimated Creatinine Clearance: 65.4 mL/min (A) (by C-G formula based on SCr of 0.4 mg/dL (L)). Recent Labs  Lab 04/06/24 1103 04/10/24 1255 04/11/24 0428  WBC 6.1 7.2 9.7    Liver Function Tests: Recent Labs  Lab 04/06/24 1103 04/10/24 1255  AST 13* 17  ALT 13 14  ALKPHOS 59 44  BILITOT 1.6* 1.3*  PROT 6.7 5.1*  ALBUMIN  4.1 2.8*   No results for input(s): LIPASE, AMYLASE in the last 168 hours. No results for input(s): AMMONIA in the last 168 hours.  ABG    Component Value Date/Time   PHART 7.365 04/11/2024 1556   PCO2ART 34.9 04/11/2024 1556   PO2ART 83 04/11/2024 1556   HCO3 20.3 04/11/2024 1556   TCO2 21 (L) 04/11/2024 1556   ACIDBASEDEF 5.0 (H) 04/11/2024 1556   O2SAT 97 04/11/2024 1556     Coagulation Profile: No results for input(s): INR, PROTIME in the last 168 hours.  Cardiac Enzymes: No results for input(s): CKTOTAL, CKMB, CKMBINDEX, TROPONINI in the last 168 hours.  HbA1C: Hgb A1c MFr Bld  Date/Time Value Ref Range Status  04/10/2024 01:30 PM 4.9 4.8 - 5.6 % Final    Comment:    (NOTE) Diagnosis of Diabetes The following HbA1c ranges recommended by the American Diabetes Association (ADA) may be used as an aid in the diagnosis of diabetes mellitus.  Hemoglobin             Suggested A1C NGSP%              Diagnosis  <5.7                   Non Diabetic  5.7-6.4                Pre-Diabetic  >6.4                   Diabetic  <7.0                   Glycemic control for                       adults with diabetes.      CBG: Recent Labs  Lab 04/11/24 1555  GLUCAP 120*    Review of Systems:   Unable to obtain as patient is intubated and sedated  Past Medical History:  He,  has a past medical history of Depression, Hypercholesteremia, Hypertension, HZ (herpes zoster) (09/02/2011), Neuritis, and Polycythemia rubra vera (HCC) (09/02/2011).    Surgical History:   Past Surgical History:  Procedure Laterality Date   CORONARY/GRAFT ACUTE MI REVASCULARIZATION N/A 04/10/2024   Procedure: Coronary/Graft Acute MI Revascularization;  Surgeon: Ladona Heinz, MD;  Location: [facility] INVASIVE CV LAB;  Service: Cardiovascular;  Laterality: N/A;   GAS INSERTION  06/04/2012   Procedure: INSERTION OF GAS;  Surgeon: Norleen JONETTA Ku, MD;  Location: [facility] OR;  Service: Ophthalmology;  Laterality: Left;  [id number]   LEFT HEART CATH AND CORONARY ANGIOGRAPHY N/A 04/10/2024   Procedure: LEFT HEART CATH AND CORONARY ANGIOGRAPHY;  Surgeon: Ladona Heinz, MD;  Location: [facility] INVASIVE CV LAB;  Service: Cardiovascular;  Laterality: N/A;   SCLERAL  BUCKLE  06/04/2012   Procedure: SCLERAL BUCKLE;  Surgeon: Norleen JONETTA Ku, MD;  Location: [facility] OR;  Service: Ophthalmology;  Laterality: Left;  Headscope laser     Social History:   reports that he has never smoked. He has never used smokeless tobacco. He reports that he does not drink alcohol and does not use drugs.   Family History:  His family history includes Heart attack in his mother.   Allergies Allergies  Allergen Reactions   Hctz [Hydrochlorothiazide ] Other (See Comments)    Hyponatremia ; Na+ 111, w weakness resulting in mechanical fall   Tramadol Nausea And Vomiting     Home Medications  Prior to Admission medications   Medication Sig Start Date End Date Taking? Authorizing Provider  ALPRAZolam  (XANAX ) 1 MG tablet Take 0.5 mg by mouth at bedtime. 03/13/21  Yes [provider]  aspirin  81 MG chewable tablet Chew 324 mg by mouth as needed for mild pain (pain score 1-3) or moderate pain (pain score 4-6).   Yes [provider]  Cholecalciferol  50 MCG (2000 UT) CAPS Take 2,000 Units by mouth daily.   Yes [provider]  fish oil-omega-3 fatty acids 1000 MG capsule Take 1 g by mouth every other day.   Yes [provider]  gabapentin  (NEURONTIN ) 100 MG capsule Take 2 capsules (200 mg  total) by mouth 3 (three) times daily. Patient taking differently: Take 200 mg by mouth at bedtime. 10/13/23 04/10/24 Yes Georgina Ozell LABOR, MD  lisinopril -hydrochlorothiazide  (ZESTORETIC ) 20-12.5 MG tablet Take 1 tablet by mouth daily.   Yes [provider]  naproxen  (NAPROSYN ) 500 MG tablet TAKE 1 TABLET BY MOUTH TWICE A DAY WITH FOOD 03/15/24  Yes Georgina Ozell LABOR, MD  simvastatin  (ZOCOR ) 20 MG tablet Take 1 tablet (20 mg total) by mouth daily at 6 PM. Patient taking differently: Take 20 mg by mouth in the morning. 03/05/21  Yes Medina-Vargas, Monina C, NP  tamsulosin  (FLOMAX ) 0.4 MG CAPS capsule Take 0.4 mg by mouth daily. 03/26/21  Yes [provider]  vitamin B-12 (CYANOCOBALAMIN ) 100 MCG tablet Take 100 mcg by mouth daily.   Yes [provider]  celecoxib  (CELEBREX ) 100 MG capsule TAKE 1 CAPSULE BY MOUTH TWICE A DAY Patient not taking: Reported on 04/06/2024 12/18/23   Georgina Ozell LABOR, MD     Critical care time:      The patient is critically ill due to acute NSTEMI/multivessel coronary artery disease status post CABG x 3/acute respiratory insufficiency, requiring titration of ventilator.  Critical care was necessary to treat or prevent imminent or life-threatening deterioration.  Critical care was time spent personally by me on the following activities: development of treatment plan with patient and/or surrogate as well as nursing, discussions with consultants, evaluation of patient's response to treatment, examination of patient, obtaining history from patient or surrogate, ordering and performing treatments and interventions, ordering and review of laboratory studies, ordering and review of radiographic studies, pulse oximetry, re-evaluation of patient's condition and participation in multidisciplinary rounds.   During this encounter critical care time was devoted to patient care services described in this note for 39 minutes.     Valinda Novas, MD [facility]  Critical Care See Amion for pager If no response to pager, please call [phone] until 7pm After 7pm, Please call E-link [phone]

## 2024-04-11 NOTE — Progress Notes (Signed)
 Pt arrived to CVICU from OR at 1538. FloTrak transducer broken during transport. Anesthesia replaced arterial line tubing with regular transducer. Per Dr Shyrl, do not replace with new FloTrak transducers and just use arterial line BP for hemodynamic monitoring. Anesthesia reported difficulty placing OGT. Xray showed OGT above diaphragm. 2 Rns attempted to advance OGT unsuccessfully. Per Dr Harold, remove OGT and do not replace for now.

## 2024-04-11 NOTE — Anesthesia Procedure Notes (Signed)
 Arterial Line Insertion Start/End7/20/2025 10:40 AM, 04/11/2024 10:46 AM Performed by: CRNA  Patient location: Pre-op. Preanesthetic checklist: patient identified, IV checked, site marked, risks and benefits discussed, surgical consent, monitors and equipment checked, pre-op evaluation, timeout performed and anesthesia consent Lidocaine  1% used for infiltration and patient sedated Left, radial was placed Catheter size: 20 G Hand hygiene performed  and maximum sterile barriers used  Allen's test indicative of satisfactory collateral circulation Attempts: 1 Procedure performed without using ultrasound guided technique. Following insertion, Biopatch and dressing applied. Post procedure assessment: normal  Patient tolerated the procedure well with no immediate complications.

## 2024-04-11 NOTE — Progress Notes (Signed)
 Advanced Heart Failure Rounding Note  Cardiologist: Gordy Bergamo, MD  Chief Complaint: chest pain Subjective:    Patient presented with acute onset chest pain, taken to the cath lab given EKG showing global ischemia and was found to have severe, heavily calcified multivessel CAD. Plan for CABG today, reports light chest pain.    Objective:   Weight Range: 64.5 kg Body mass index is 22.95 kg/m.   Vital Signs:   Temp:  [97.7 F (36.5 C)-98.1 F (36.7 C)] 98.1 F (36.7 C) (07/20 0748) Pulse Rate:  [60-81] 71 (07/20 1104) Resp:  [13-27] 21 (07/20 1104) BP: (100-153)/(60-99) 112/66 (07/20 1100) SpO2:  [94 %-100 %] 99 % (07/20 1104) Weight:  [64.5 kg] 64.5 kg (07/20 0840) Last BM Date : 04/10/24  Weight change: Filed Weights   04/11/24 0840  Weight: 64.5 kg    Intake/Output:   Intake/Output Summary (Last 24 hours) at 04/11/2024 1439 Last data filed at 04/11/2024 1404 Gross per 24 hour  Intake 2140.38 ml  Output 2750 ml  Net -609.62 ml      Physical Exam    GENERAL: NAD, well appearing PULM:  Normal work of breathing, CTAB CARDIAC:  JVP: flat         Normal rate with regular rhythm. No murmurs, rubs or gallops.  trace edema. Warm and well perfused extremities. ABDOMEN: Soft, non-tender, non-distended. NEUROLOGIC: Patient is oriented x3 with no focal or lateralizing neurologic deficits.     Medications:     Scheduled Medications:  [MAR Hold] aspirin   81 mg Oral Daily   [MAR Hold] atorvastatin   80 mg Oral Daily   epinephrine   0-10 mcg/min Intravenous To OR   heparin  sodium (porcine) 2,500 Units, papaverine  30 mg in electrolyte-A (PLASMALYTE-A PH 7.4) 500 mL irrigation   Irrigation To OR   [facility] Blood Cardioplegia vial (lidocaine /magnesium /mannitol  0.26g-4g-6.4g)   Intracoronary To OR   [MAR Hold] mupirocin  ointment  1 Application Nasal BID   potassium chloride   80 mEq Other To OR   [MAR Hold] sodium chloride  flush  3 mL Intravenous Q12H   tranexamic  acid  2 mg/kg Intracatheter To OR    Infusions:   ceFAZolin  (ANCEF ) IV     heparin  30,000 units/NS 1000 mL solution for CELLSAVER     milrinone      nitroGLYCERIN       PRN Medications: 0.9 % irrigation (POUR BTL), [MAR Hold] acetaminophen , heparin  sodium (porcine) 2,500 Units, papaverine  30 mg in electrolyte-A (PLASMALYTE-A PH 7.4) 500 mL irrigation, [MAR Hold] ondansetron  (ZOFRAN ) IV, [MAR Hold] mouth rinse, [MAR Hold] sodium chloride  flush, Surgifoam 1 Gm with 0.9% sodium chloride  (4 ml) topical solution    Patient Profile   Patient with a PMH of HTN, HLD, polycythmia vera who presents with chest pain. Taking to the cath lab emergently given EKG showing diffuse ishcemia. Found to have calcified multivessel disease.  Assessment/Plan   CAD: High risk NSTEMI presentation, heavily calcified three vessel disease. To CABG today with Dr. Shyrl. - Aspirin  and plavix following CABG, discretion per surgeon - Heparin  gtt off in preparation of procedure - LV function normal on Lvgram - High intensity statin  Polycythemia vera:  - Last phlebotomy 7/15, normal iron labs at that time  HLD:  - Continue lipitor 80mg  daily  Hypertension:  - Meds held in anticipation of surgery  To CABG today, will see post operatively.     Length of Stay: 1  Morene JINNY Brownie, MD  04/11/2024, 2:39 PM  Advanced Heart  Failure Team Pager [phone] (M-F; 7a - 5p)  Please contact [facility] for night-coverage after hours (5p -7a ) and weekends on [email or web address]

## 2024-04-11 NOTE — Progress Notes (Signed)
     [address]       [place] [address]             [phone]       No events Vitals:   04/11/24 0748 04/11/24 0800  BP:  133/72  Pulse: 67 70  Resp: 18 (!) 22  Temp: 98.1 F (36.7 C)   SpO2: 96% 96%   Alert NAD Sinus EWOB  OR today for CABG

## 2024-04-12 ENCOUNTER — Encounter (HOSPITAL_COMMUNITY): Payer: Self-pay | Admitting: Thoracic Surgery (Cardiothoracic Vascular Surgery)

## 2024-04-12 ENCOUNTER — Inpatient Hospital Stay (HOSPITAL_COMMUNITY)

## 2024-04-12 DIAGNOSIS — I5032 Chronic diastolic (congestive) heart failure: Secondary | ICD-10-CM | POA: Diagnosis not present

## 2024-04-12 DIAGNOSIS — Z951 Presence of aortocoronary bypass graft: Secondary | ICD-10-CM | POA: Diagnosis not present

## 2024-04-12 DIAGNOSIS — I214 Non-ST elevation (NSTEMI) myocardial infarction: Secondary | ICD-10-CM | POA: Diagnosis not present

## 2024-04-12 DIAGNOSIS — I10 Essential (primary) hypertension: Secondary | ICD-10-CM | POA: Diagnosis not present

## 2024-04-12 LAB — GLUCOSE, CAPILLARY
Glucose-Capillary: 103 mg/dL — ABNORMAL HIGH (ref 70–99)
Glucose-Capillary: 105 mg/dL — ABNORMAL HIGH (ref 70–99)
Glucose-Capillary: 110 mg/dL — ABNORMAL HIGH (ref 70–99)
Glucose-Capillary: 113 mg/dL — ABNORMAL HIGH (ref 70–99)
Glucose-Capillary: 114 mg/dL — ABNORMAL HIGH (ref 70–99)
Glucose-Capillary: 115 mg/dL — ABNORMAL HIGH (ref 70–99)
Glucose-Capillary: 118 mg/dL — ABNORMAL HIGH (ref 70–99)
Glucose-Capillary: 122 mg/dL — ABNORMAL HIGH (ref 70–99)
Glucose-Capillary: 132 mg/dL — ABNORMAL HIGH (ref 70–99)
Glucose-Capillary: 139 mg/dL — ABNORMAL HIGH (ref 70–99)

## 2024-04-12 LAB — CBC
HCT: 29.4 % — ABNORMAL LOW (ref 39.0–52.0)
HCT: 31.7 % — ABNORMAL LOW (ref 39.0–52.0)
Hemoglobin: 10 g/dL — ABNORMAL LOW (ref 13.0–17.0)
Hemoglobin: 10.7 g/dL — ABNORMAL LOW (ref 13.0–17.0)
MCH: 32.8 pg (ref 26.0–34.0)
MCH: 32.9 pg (ref 26.0–34.0)
MCHC: 34 g/dL (ref 30.0–36.0)
MCHC: 33.8 g/dL (ref 30.0–36.0)
MCV: 96.4 fL (ref 80.0–100.0)
MCV: 97.5 fL (ref 80.0–100.0)
Platelets: 154 K/uL (ref 150–400)
Platelets: 165 K/uL (ref 150–400)
RBC: 3.05 MIL/uL — ABNORMAL LOW (ref 4.22–5.81)
RBC: 3.25 MIL/uL — ABNORMAL LOW (ref 4.22–5.81)
RDW: 14.3 % (ref 11.5–15.5)
RDW: 14.4 % (ref 11.5–15.5)
WBC: 12.1 K/uL — ABNORMAL HIGH (ref 4.0–10.5)
WBC: 14.6 K/uL — ABNORMAL HIGH (ref 4.0–10.5)
nRBC: 0 % (ref 0.0–0.2)
nRBC: 0 % (ref 0.0–0.2)

## 2024-04-12 LAB — PREPARE FRESH FROZEN PLASMA
Unit division: 0
Unit division: 0

## 2024-04-12 LAB — BASIC METABOLIC PANEL WITH GFR
Anion gap: 7 (ref 5–15)
Anion gap: 10 (ref 5–15)
BUN: 10 mg/dL (ref 8–23)
BUN: 13 mg/dL (ref 8–23)
CO2: 22 mmol/L (ref 22–32)
CO2: 21 mmol/L — ABNORMAL LOW (ref 22–32)
Calcium: 8.3 mg/dL — ABNORMAL LOW (ref 8.9–10.3)
Calcium: 8.5 mg/dL — ABNORMAL LOW (ref 8.9–10.3)
Chloride: 105 mmol/L (ref 98–111)
Chloride: 99 mmol/L (ref 98–111)
Creatinine, Ser: 0.55 mg/dL — ABNORMAL LOW (ref 0.61–1.24)
Creatinine, Ser: 0.73 mg/dL (ref 0.61–1.24)
GFR, Estimated: >60 mL/min (ref >60)
GFR, Estimated: >60 mL/min (ref >60)
Glucose, Bld: 109 mg/dL — ABNORMAL HIGH (ref 70–99)
Glucose, Bld: 135 mg/dL — ABNORMAL HIGH (ref 70–99)
Potassium: 3.9 mmol/L (ref 3.5–5.1)
Potassium: 4 mmol/L (ref 3.5–5.1)
Sodium: 134 mmol/L — ABNORMAL LOW (ref 135–145)
Sodium: 130 mmol/L — ABNORMAL LOW (ref 135–145)

## 2024-04-12 LAB — BPAM FFP
Blood Product Expiration Date: 202507242359
Blood Product Expiration Date: 202507242359
ISSUE DATE / TIME: 202507201226
ISSUE DATE / TIME: 202507201226
Unit Type and Rh: 600
Unit Type and Rh: 6200

## 2024-04-12 LAB — MAGNESIUM
Magnesium: 2.4 mg/dL (ref 1.7–2.4)
Magnesium: 2.2 mg/dL (ref 1.7–2.4)

## 2024-04-12 MED ORDER — INSULIN ASPART 100 UNIT/ML IJ SOLN: 0.0000 [IU] | INTRAMUSCULAR | Status: DC

## 2024-04-12 MED ORDER — ROCURONIUM BROMIDE 10 MG/ML (PF) SYRINGE
PREFILLED_SYRINGE | INTRAVENOUS | Status: AC
  Filled 2024-04-12: qty 20

## 2024-04-12 MED ORDER — LIDOCAINE 2% (20 MG/ML) 5 ML SYRINGE
INTRAMUSCULAR | Status: AC
Start: 2024-04-12 — End: 2024-04-12
  Filled 2024-04-12: qty 5

## 2024-04-12 MED ORDER — HEPARIN SODIUM (PORCINE) 1000 UNIT/ML IJ SOLN
INTRAMUSCULAR | Status: AC
  Filled 2024-04-12: qty 30

## 2024-04-12 MED ORDER — DIPHENHYDRAMINE HCL 50 MG/ML IJ SOLN
INTRAMUSCULAR | Status: AC
  Filled 2024-04-12: qty 1

## 2024-04-12 MED ORDER — SUCCINYLCHOLINE CHLORIDE 200 MG/10ML IV SOSY
PREFILLED_SYRINGE | INTRAVENOUS | Status: AC
  Filled 2024-04-12: qty 10

## 2024-04-12 MED ORDER — POTASSIUM CHLORIDE CRYS ER 20 MEQ PO TBCR
20.0000 meq | EXTENDED_RELEASE_TABLET | Freq: Every day | ORAL | Status: DC
  Administered 2024-04-12: 20 meq via ORAL
  Filled 2024-04-12: qty 1

## 2024-04-12 MED ORDER — DOCUSATE SODIUM 100 MG PO CAPS
200.0000 mg | ORAL_CAPSULE | Freq: Two times a day (BID) | ORAL | Status: DC
  Administered 2024-04-12 – 2024-04-23 (×20): 200 mg via ORAL
  Filled 2024-04-12 (×22): qty 2

## 2024-04-12 MED ORDER — ENOXAPARIN SODIUM 40 MG/0.4ML IJ SOSY
40.0000 mg | PREFILLED_SYRINGE | Freq: Every day | INTRAMUSCULAR | Status: DC
  Administered 2024-04-12 – 2024-04-22 (×11): 40 mg via SUBCUTANEOUS
  Filled 2024-04-12 (×11): qty 0.4

## 2024-04-12 MED ORDER — ONDANSETRON HCL 4 MG/2ML IJ SOLN
INTRAMUSCULAR | Status: AC
  Filled 2024-04-12: qty 2

## 2024-04-12 MED ORDER — PHENYLEPHRINE 80 MCG/ML (10ML) SYRINGE FOR IV PUSH (FOR BLOOD PRESSURE SUPPORT)
PREFILLED_SYRINGE | INTRAVENOUS | Status: AC
  Filled 2024-04-12: qty 10

## 2024-04-12 MED ORDER — DEXAMETHASONE SODIUM PHOSPHATE 10 MG/ML IJ SOLN
INTRAMUSCULAR | Status: AC
  Filled 2024-04-12: qty 1

## 2024-04-12 MED ORDER — FUROSEMIDE 10 MG/ML IJ SOLN
40.0000 mg | Freq: Once | INTRAMUSCULAR | Status: AC
  Administered 2024-04-12: 40 mg via INTRAVENOUS
  Filled 2024-04-12: qty 4

## 2024-04-12 MED ORDER — SODIUM BICARBONATE 8.4 % IV SOLN
INTRAVENOUS | Status: AC
  Filled 2024-04-12: qty 50

## 2024-04-12 MED ORDER — CALCIUM CHLORIDE 10 % IV SOLN
INTRAVENOUS | Status: AC
  Filled 2024-04-12: qty 20

## 2024-04-12 MED ORDER — INSULIN ASPART 100 UNIT/ML IJ SOLN
0.0000 [IU] | INTRAMUSCULAR | Status: DC
  Administered 2024-04-12 (×2): 2 [IU] via SUBCUTANEOUS

## 2024-04-12 NOTE — Progress Notes (Addendum)
 [address] [employer] [address]             [phone]         1 Day Post-Op Procedure(s) (LRB): CORONARY ARTERY BYPASS GRAFTING (CABG) X THREE, USING LEFT INTERNAL MAMMARY ARTERY AND RIGHT LEG GREATER SAPHENOUS VEIN HARVESTED ENDOSCOPICALLY (N/A) ECHOCARDIOGRAM, TRANSESOPHAGEAL, INTRAOPERATIVE  Subjective:  Patient sitting up in bed. Doing okay. Having some pain, but fairly well controlled.  Denies N/V  Objective: Vital signs in last 24 hours: Temp:  [95.4 F (35.2 C)-99.9 F (37.7 C)] 99.9 F (37.7 C) (07/21 0700) Pulse Rate:  [60-93] 83 (07/21 0700) Cardiac Rhythm: Normal sinus rhythm (07/21 0400) Resp:  [10-29] 15 (07/21 0700) BP: (75-133)/(51-76) 109/64 (07/20 1900) SpO2:  [92 %-100 %] 93 % (07/21 0700) Arterial Line BP: (84-167)/(37-77) 140/50 (07/21 0700) FiO2 (%):  [40 %-50 %] 40 % (07/20 2015) Weight:  [64.5 kg-67.6 kg] 67.6 kg (07/21 0500)  Intake/Output from previous day: 07/20 0701 - 07/21 0700 In: 4606.1 [P.O.:236; I.V.:2134.2; Blood:617; IV Piggyback:1618.9] Out: 4310 [Urine:3500; Blood:500; Chest Tube:310]  General appearance: alert, cooperative, and no distress Heart: regular rate and rhythm, + friction rub Lungs: diminished breath sounds bibasilar and poor inspiratory effort Abdomen: soft, non-tender; bowel sounds normal; no masses,  no organomegaly Extremities: no edema present Wound: aquacel on sternotomy, RLE EVH sites C/D/I + ecchymosis RLE  Lab Results: Recent Labs    04/11/24 2212 04/11/24 2217 04/12/24 0428  WBC 14.9*  --  12.1*  HGB 9.8* 9.9* 10.0*  HCT 29.2* 29.0* 29.4*  PLT 147*  --  154   BMET:  Recent Labs    04/11/24 2212 04/11/24 2217 04/12/24 0428  NA 133* 133* 134*  K 4.5 4.5 3.9  CL 105  --  105  CO2 20*  --  22  GLUCOSE 182*  --  109*  BUN 10  --  10  CREATININE 0.65  --  0.55*  CALCIUM  8.2*  --  8.3*    PT/INR:  Recent Labs    04/11/24 1553  LABPROT 25.2*  INR 2.2*   ABG     Component Value Date/Time   PHART 7.374 04/11/2024 2217   HCO3 19.9 (L) 04/11/2024 2217   TCO2 21 (L) 04/11/2024 2217   ACIDBASEDEF 5.0 (H) 04/11/2024 2217   O2SAT 98 04/11/2024 2217   CBG (last 3)  Recent Labs    04/12/24 0207 04/12/24 0302 04/12/24 0421  GLUCAP 113* 114* 105*    Assessment/Plan: S/P Procedure(s) (LRB): CORONARY ARTERY BYPASS GRAFTING (CABG) X THREE, USING LEFT INTERNAL MAMMARY ARTERY AND RIGHT LEG GREATER SAPHENOUS VEIN HARVESTED ENDOSCOPICALLY (N/A) ECHOCARDIOGRAM, TRANSESOPHAGEAL, INTRAOPERATIVE  1.CV- NSR, BP stable- has been weaned off all drips, start low dose Lopressor  today, d/c EPW today 2. Pulm- CT output 310 cc since surgery, leave in place today... CXR with bilateral atelectasis, left pleural effusion.. wean oxygen as tolerated, continue IS 3. Renal- creatinine is stable, K is stable.  Will start Lasix  today to facilitate diuretics 4. H/O Polycythemia Vera, Hgb is stable at 10 post operatively 5. CBGs controlled, patient does not have a history of DM, will transition to SSIP today  POD #1 progression orders, patient doing well overall   LOS: 2 days    Rocky Shad, PA-C 04/12/2024 7:20 AM   Chart reviewed, patient examined, agree with above.  He is doing well. Start diuresis today. Pacing wires out today and keep CT's in. IS, OOB,  mobilize.

## 2024-04-12 NOTE — Progress Notes (Signed)
 Advanced Heart Failure Rounding Note  Cardiologist: Gordy Bergamo, MD  HF Consulting MD: Dr. Zenaida Chief Complaint:  Patient Profile   Patient with a PMH of HTN, HLD, polycythmia vera who presents with chest pain. Taking to the cath lab emergently given EKG showing diffuse ishcemia. Found to have calcified multivessel disease.   Subjective:    7/19: LHC w complex anatomy with heavily calcified LM, ostCX, and RCA. TCTS consult for CABG. 7/20: CABG x3 (LIMA-LAD, SVG-PDA, SVG-OM) 7/21: Extubated. Off pressors  POD#1  Doing well post-op. 3.5L UOP. Ambulating to chair on exam. Feelings sore today.   Objective:    Weight Range: 67.6 kg Body mass index is 24.05 kg/m.   Vital Signs:   Temp:  [95.4 F (35.2 C)-100 F (37.8 C)] 100 F (37.8 C) (07/21 0745) Pulse Rate:  [60-93] 80 (07/21 0745) Resp:  [10-29] 14 (07/21 0745) BP: (75-127)/(51-76) 109/64 (07/20 1900) SpO2:  [92 %-100 %] 93 % (07/21 0745) Arterial Line BP: (84-167)/(37-77) 145/49 (07/21 0745) FiO2 (%):  [40 %-50 %] 40 % (07/20 2015) Weight:  [64.5 kg-67.6 kg] 67.6 kg (07/21 0500) Last BM Date : 04/11/24  Weight change: Filed Weights   04/11/24 0840 04/12/24 0500  Weight: 64.5 kg 67.6 kg   Intake/Output:  Intake/Output Summary (Last 24 hours) at 04/12/2024 0806 Last data filed at 04/12/2024 0700 Gross per 24 hour  Intake 4606.1 ml  Output 4343 ml  Net 263.1 ml    Physical Exam    General: Elderly appearing.  Cardiac: S1 and S2 present. Normal rate and rhythm Extremities: Warm and dry.  No peripheral edema.  Neuro: Alert and oriented x3. Generalized weakness  Telemetry   AV paced 80s (personally reviewed)  EKG    NSR 83 bpm (personally reviewed)  Labs    CBC Recent Labs    04/10/24 1255 04/11/24 0428 04/11/24 2212 04/11/24 2217 04/12/24 0428  WBC 7.2   < > 14.9*  --  12.1*  NEUTROABS 5.2  --   --   --   --   HGB 13.1   < > 9.8* 9.9* 10.0*  HCT 38.0*   < > 29.2* 29.0* 29.4*  MCV 93.4    < > 97.0  --  96.4  PLT 213   < > 147*  --  154   < > = values in this interval not displayed.   Basic Metabolic Panel Recent Labs    92/79/74 2212 04/11/24 2217 04/12/24 0428  NA 133* 133* 134*  K 4.5 4.5 3.9  CL 105  --  105  CO2 20*  --  22  GLUCOSE 182*  --  109*  BUN 10  --  10  CREATININE 0.65  --  0.55*  CALCIUM  8.2*  --  8.3*  MG 3.0*  --  2.4   Liver Function Tests Recent Labs    04/10/24 1255  AST 17  ALT 14  ALKPHOS 44  BILITOT 1.3*  PROT 5.1*  ALBUMIN  2.8*   Hemoglobin A1C Recent Labs    04/10/24 1330  HGBA1C 4.9   Fasting Lipid Panel Recent Labs    04/10/24 1259  CHOL 129  HDL 36*  LDLCALC 83  TRIG 51  CHOLHDL 3.6   Thyroid  Function Tests Recent Labs    04/10/24 1259  TSH 1.335   Medications:    Scheduled Medications:  acetaminophen   1,000 mg Oral Q6H   Or   acetaminophen  (TYLENOL ) oral liquid 160 mg/5 mL  1,000  mg Per Tube Q6H   acetaminophen  (TYLENOL ) oral liquid 160 mg/5 mL  650 mg Per Tube Once   ALPRAZolam   0.5 mg Oral QHS   aspirin   324 mg Oral Once   aspirin  EC  325 mg Oral Daily   Or   aspirin   324 mg Per Tube Daily   atorvastatin   80 mg Oral Daily   bisacodyl   10 mg Oral Daily   Or   bisacodyl   10 mg Rectal Daily   Chlorhexidine  Gluconate Cloth  6 each Topical Daily   docusate  200 mg Per Tube Daily   enoxaparin  (LOVENOX ) injection  40 mg Subcutaneous QHS   furosemide   40 mg Intravenous Once   gabapentin   200 mg Per Tube QHS   insulin  aspart  0-24 Units Subcutaneous Q4H   insulin  aspart  0-24 Units Subcutaneous Q4H   metoCLOPramide  (REGLAN ) injection  10 mg Intravenous Q6H   metoprolol  tartrate  12.5 mg Oral BID   Or   metoprolol  tartrate  12.5 mg Per Tube BID   [START ON 04/13/2024] pantoprazole   40 mg Oral Daily   pantoprazole  (PROTONIX ) IV  40 mg Intravenous QHS   potassium chloride   20 mEq Oral Daily   sodium chloride  flush  10-40 mL Intracatheter Q12H   sodium chloride  flush  3 mL Intravenous Q12H    tamsulosin   0.4 mg Oral Daily   Infusions:  sodium chloride      sodium chloride      sodium chloride      albumin  human 999 mL/hr at 04/12/24 0700    ceFAZolin  (ANCEF ) IV Stopped (04/12/24 0601)   dexmedetomidine  (PRECEDEX ) IV infusion Stopped (04/11/24 2104)   lactated ringers      lactated ringers  20 mL/hr at 04/12/24 0700   PRN Medications: sodium chloride , albumin  human, metoprolol  tartrate, morphine  injection, ondansetron  (ZOFRAN ) IV, mouth rinse, oxyCODONE , sodium chloride  flush, sodium chloride  flush  Assessment/Plan   CAD: High risk NSTEMI presentation. Cath 7/19 with complex anatomy with heavily calcified LM, ostCX, and RCA. Pre-op echo with normal EF.  - Now s/p CABG x3 (LIMA-LAD, SVG-PDA, SVG-OM) - Doing well post-op: extubated, off pressors - continue ASA 325 mg daily - continue statin 80 mg daily - give Lasix  40 mg IV x1 today per TCTS  Polycythemia vera:  - Last phlebotomy 7/15, normal iron labs at that time   HLD:  - Continue lipitor 80mg  daily   Hypertension:  - BP stable  Length of Stay: 2  Advanced Heart Failure to sign off at this time. Happy to re-consult if needed in the future.   [place] Demetric Dunnaway, NP  04/12/2024, 8:06 AM  Advanced Heart Failure Team Pager [phone] (M-F; 7a - 5p)  Please contact [facility] for night-coverage after hours (5p -7a ) and weekends on [email or web address]

## 2024-04-12 NOTE — Discharge Instructions (Signed)

## 2024-04-12 NOTE — Discharge Summary (Signed)
 [address] [place] [address]             [phone]        Physician Discharge Summary  Patient ID: Jesse [name] MRN: [id number] DOB/AGE: [birth year] 82 y.o.  Admit date: 04/10/2024 Discharge date: 04/23/2024  Admission Diagnoses:  Patient Active Problem List   Diagnosis Date Noted   NSTEMI (non-ST elevated myocardial infarction) (HCC) 04/10/2024   UPJ obstruction, acquired 03/02/2021   Diverticulosis 03/02/2021   Cervical spondylosis 03/02/2021   Unspecified protein-calorie malnutrition (HCC) 03/02/2021   Transaminitis 03/02/2021   Hyponatremia 02/22/2021   Essential hypertension 02/22/2021   Hyperlipidemia 02/22/2021   Fall at home, subsequent encounter 02/22/2021   Need for prophylactic vaccination and inoculation against influenza 06/17/2016   Polycythemia rubra vera (HCC) 09/02/2011   HZ (herpes zoster) 09/02/2011   Discharge Diagnoses:  Patient Active Problem List   Diagnosis Date Noted   S/P CABG x 3 04/11/2024   NSTEMI (non-ST elevated myocardial infarction) (HCC) 04/10/2024   UPJ obstruction, acquired 03/02/2021   Diverticulosis 03/02/2021   Cervical spondylosis 03/02/2021   Unspecified protein-calorie malnutrition (HCC) 03/02/2021   Transaminitis 03/02/2021   Hyponatremia 02/22/2021   Essential hypertension 02/22/2021   Hyperlipidemia 02/22/2021   Fall at home, subsequent encounter 02/22/2021   Need for prophylactic vaccination and inoculation against influenza 06/17/2016   Polycythemia rubra vera (HCC) 09/02/2011   HZ (herpes zoster) 09/02/2011   Discharged Condition: good  History of Present Illness:  Jesse [name] is an 83 yo male with history of Polycythemia Vera, HTN, and HLD.  He presented to the Emergency Department via EMS on 7/19 with complaints of chest pain.  EKG obtained en route showed ST elevation in AVR/V1 with reciprocal changes present.  Due to this Code STEMI was initiated.  He was treated NTG with  improvement of pain.  He also admitted to having some mild nausea.  Upon arrival to the ED repeat CXR was obtained and showed NSR with continued ST elevation in AVR, but there were no longer reciprocal changes.  Cardiac markers were positive.  Cardiology consult was obtained who recommended emergent cardiac catheterization.  This was performed by Dr. Ladona and showed multivessel CAD with heavy calcifications the left main.  The patient was felt to be too high risk for PCI.  The patient's chest pain resolved and it was felt he would be best served by coronary bypass grafting procedure.  He was treated with Aggrastat , IV heparin  and surgery consultation was requested.  Hospital Course:  The patient was admitted from the cath lab.  He remained chest pain free since admission.  Echocardiogram showed preserved function and no significant valvular disease.  He was evaluated by Dr. Shyrl who was in agreement the patient would benefit from coronary bypass grafting procedure.  The risks and benefits were explained to the patient and he was agreeable to proceed.  He was taken to the operating room on 04/11/24 and underwent CABG x 3 utilizing LIMA to LAD, SVG to OM, and SVG to PDA.  He also underwent endoscopic harvest of greater saphenous vein from his right leg.  Due to preoperative Aggrastat  administration he required transfusion of platelets and FFP.  He tolerated the procedure and was taken to the SICU in stable condition.  The patient was extubated the evening of surgery.  The patient was weaned off Levophed  and Neo-Synephrine as hemodynamics allowed.  He was maintaining NSR and  his pacing wires were removed without difficulty.  He was started on Lasix  to help facilitate diuresis.  He has chronic hyponatremia which was monitored closely.  His potassium was borderline at 3.6 and he was supplemented with a goal of 4.0.  PT/OT evaluations were obtained and they recommended SNF but the patient refused and sated he  preferred home health PT/OT.  He is maintaining NSR and was felt stable for transfer to the progressive unit on 04/13/2024. He became orthostatic, Lasix  and potassium was discontinued since the patient was at his preoperative weight.  The patient developed tachycardia with PACs.  His BP did improve with more time. He was very deconditioned. Once ambulation was improved, he was felt stable for discharge. The patient continued to refused SNF.  Home health was arranged. He continued to have orthostatic hypotension, midodrine  was started. He continued to work with PT/OT and ultimately he changed his mind and was agreeable to SNF placement. He developed mild hyponatremia, PO intake was encouraged and this improved with time. Inpatient rehab was consulted but felt he was not a candidate due to ambulating 110 feet with minimal assist. His ambulation was progressing and orthostatic hypotension continued to improve. He was saturating well on room air and bowels were moving appropriately. His incisions were healing well without sign of infection. He was felt stable for discharge to SNF.    Consults: pulmonary/intensive care  Significant Diagnostic Studies: angiography:   Cardiac Catheterization 04/10/24: Hemodynamic data: LV 109/-10, EDP 11 mmHg.  Ao 125/66, mean 76 mmHg.  No pressure gradient across the aortic valve.   Angiographic data: LM: Distal left main has calcific eccentric 99% stenosis. LAD: Flush occluded and reconstitutes at the mid to distal level via collaterals from CX.  No large diagonals visualized. LCx: Ostium calcified with a 40% stenosis.  Large OM 2 and a moderate to large OM 3 with a focal 90% stenosis.  Collaterals given to LAD.  TIMI-3 flow. RCA: Very large caliber vessel, diffusely diseased and heavily calcified.  Proximal segment is occluded with bridging collaterals and there is a tandem mid segment calcific 90% stenosis with probable mild thrombus and distal RCA has a 60% focal  stenosis.  TIMI II flow. LV: Normal LV systolic function.  No wall motion abnormality.  No significant mitral regurgitation. Left and right subclavian and IMA: Widely patent.  Impression and recommendations: Extremely complex anatomy, heavily calcified left main and ostium of CX and heavily calcified RCA, extreme high risk for PCI.  Patient is EKG normalized, patient remained chest pain-free, hemodynamics are stable and LVEF is normal.  Hence decision made to take the patient off the table, start him on Aggrastat  and continue IV heparin  once TR band is off and surgical consultation will be obtained for CABG.  Treatments: surgery:   04/11/2024 Patient:  Jesse [name] Pre-Op Dx: STEMI 3V CAD   HTN HLP Polycythemia vera   Post-op Dx:  same Procedure: CABG X 3.  LIMA LAD, RSVG PDA, OM   Endoscopic greater saphenous vein harvest on the right    Surgeon and Role:      * Lightfoot, Linnie KIDD, MD - Primary    * E. Barrett , PA-C - assisting An experienced assistant was required given the complexity of this surgery and the standard of surgical care. The assistant was needed for exposure, dissection, suctioning, retraction of delicate tissues and sutures, instrument exchange and for overall help during this procedure.    Discharge Exam: Blood pressure 117/72, pulse  95, temperature 97.8 F (36.6 C), temperature source Oral, resp. rate 18, height 5' 6 (1.676 m), weight 61.1 kg, SpO2 96%. General appearance: alert, cooperative, and no distress Neurologic: intact Heart: regular rate and rhythm, soft systolic murmur, no rubs Lungs: clear to auscultation bilaterally Abdomen: soft, non-tender; bowel sounds normal; no masses,  no organomegaly Extremities: extremities normal, atraumatic, no cyanosis or edema Wound: Clean and dry without sign of infection  Discharge Medications:  The patient has been discharged on:   1.Beta Blocker:  Yes [ X  ]                              No   [   ]                               If No, reason:  2.Ace Inhibitor/ARB: Yes [   ]                                     No  [ X   ]                                     If No, reason: orthostatic hypotension  3.Statin:   Yes [  X ]                  No  [   ]                  If No, reason:  4.Ecasa:  Yes  [  X ]                  No   [   ]                  If No, reason:  Patient had ACS upon admission: Yes  Plavix/P2Y12 inhibitor: Yes [   ]                                      No  [ X  ] risks outweigh benefits due to age and frailty     Discharge Instructions     Amb Referral to Cardiac Rehabilitation   Complete by: As directed    Diagnosis: CABG   CABG X ___: 3   After initial evaluation and assessments completed: Virtual Based Care may be provided alone or in conjunction with Phase 2 Cardiac Rehab based on patient barriers.: Yes   Intensive Cardiac Rehabilitation (ICR) [facility] location only OR Traditional Cardiac Rehabilitation (TCR) *If criteria for ICR are not met will enroll in TCR ([facility] only): Yes      Allergies as of 04/23/2024       Reactions   Hctz [hydrochlorothiazide ] Other (See Comments)   Hyponatremia ; Na+ 111, w weakness resulting in mechanical fall   Tramadol Nausea And Vomiting        Medication List     STOP taking these medications    aspirin  81 MG chewable tablet Replaced by: aspirin  EC 325 MG tablet   celecoxib  100 MG capsule Commonly known as: CELEBREX    lisinopril -hydrochlorothiazide  20-12.5  MG tablet Commonly known as: ZESTORETIC    naproxen  500 MG tablet Commonly known as: NAPROSYN    simvastatin  20 MG tablet Commonly known as: ZOCOR        TAKE these medications    ALPRAZolam  1 MG tablet Commonly known as: XANAX  Take 0.5 tablets (0.5 mg total) by mouth at bedtime.   aspirin  EC 325 MG tablet Take 1 tablet (325 mg total) by mouth daily. Replaces: aspirin  81 MG chewable tablet   atorvastatin  80 MG tablet Commonly known as: LIPITOR  Take 1  tablet (80 mg total) by mouth daily.   Cholecalciferol  50 MCG (2000 UT) Caps Take 2,000 Units by mouth daily.   fish oil-omega-3 fatty acids 1000 MG capsule Take 1 g by mouth every other day.   gabapentin  100 MG capsule Commonly known as: NEURONTIN  Take 2 capsules (200 mg total) by mouth at bedtime.   midodrine  10 MG tablet Commonly known as: PROAMATINE  Take 1 tablet (10 mg total) by mouth 3 (three) times daily with meals.   oxyCODONE  5 MG immediate release tablet Commonly known as: Oxy IR/ROXICODONE  Take 1 tablet (5 mg total) by mouth every 6 (six) hours as needed for severe pain (pain score 7-10).   tamsulosin  0.4 MG Caps capsule Commonly known as: FLOMAX  Take 0.4 mg by mouth daily.   vitamin B-12 100 MCG tablet Commonly known as: CYANOCOBALAMIN  Take 100 mcg by mouth daily.               Durable Medical Equipment  (From admission, onward)           Start     Ordered   04/14/24 0822  For home use only DME 4 wheeled rolling walker with seat  Once       Question Answer Comment  Patient needs a walker to treat with the following condition S/P CABG (coronary artery bypass graft)   Patient needs a walker to treat with the following condition Physical deconditioning      04/14/24 9178            Follow-up Information     Shyrl Linnie KIDD, MD Follow up on 04/30/2024.   Specialty: Cardiothoracic Surgery Why: This is a virtual visit.  YOUR APPOINTMENT TIME IS AT 2:40PM... Dr. Shyrl will call you Contact information: [address] [place] [address] [phone]         Lucien Orren SAILOR, PA-C Follow up on 05/03/2024.   Specialty: Cardiology Why: Cardiology appointment is at 8:25AM Contact information: [address] [place] [address] [phone]                 Signed:  Con GORMAN [place], PA-C  04/23/2024, 9:19 AM

## 2024-04-12 NOTE — Plan of Care (Signed)
  Problem: Education: Goal: Knowledge of General Education information will improve Description: Including pain rating scale, medication(s)/side effects and non-pharmacologic comfort measures Outcome: Progressing   Problem: Health Behavior/Discharge Planning: Goal: Ability to manage health-related needs will improve Outcome: Progressing   Problem: Clinical Measurements: Goal: Ability to maintain clinical measurements within normal limits will improve Outcome: Progressing Goal: Will remain free from infection Outcome: Progressing Goal: Diagnostic test results will improve Outcome: Progressing Goal: Respiratory complications will improve Outcome: Progressing Goal: Cardiovascular complication will be avoided Outcome: Progressing   Problem: Activity: Goal: Risk for activity intolerance will decrease Outcome: Progressing   Problem: Nutrition: Goal: Adequate nutrition will be maintained Outcome: Progressing   Problem: Coping: Goal: Level of anxiety will decrease Outcome: Progressing   Problem: Elimination: Goal: Will not experience complications related to bowel motility Outcome: Progressing Goal: Will not experience complications related to urinary retention Outcome: Progressing   Problem: Pain Managment: Goal: General experience of comfort will improve and/or be controlled Outcome: Progressing   Problem: Safety: Goal: Ability to remain free from injury will improve Outcome: Progressing   Problem: Skin Integrity: Goal: Risk for impaired skin integrity will decrease Outcome: Progressing   Problem: Education: Goal: Understanding of CV disease, CV risk reduction, and recovery process will improve Outcome: Progressing Goal: Individualized Educational Video(s) Outcome: Progressing   Problem: Activity: Goal: Ability to return to baseline activity level will improve Outcome: Progressing   Problem: Cardiovascular: Goal: Ability to achieve and maintain adequate  cardiovascular perfusion will improve Outcome: Progressing Goal: Vascular access site(s) Level 0-1 will be maintained Outcome: Progressing   Problem: Health Behavior/Discharge Planning: Goal: Ability to safely manage health-related needs after discharge will improve Outcome: Progressing   Problem: Education: Goal: Will demonstrate proper wound care and an understanding of methods to prevent future damage Outcome: Progressing Goal: Knowledge of disease or condition will improve Outcome: Progressing Goal: Knowledge of the prescribed therapeutic regimen will improve Outcome: Progressing Goal: Individualized Educational Video(s) Outcome: Progressing   Problem: Activity: Goal: Risk for activity intolerance will decrease Outcome: Progressing   Problem: Cardiac: Goal: Will achieve and/or maintain hemodynamic stability Outcome: Progressing   Problem: Clinical Measurements: Goal: Postoperative complications will be avoided or minimized Outcome: Progressing   Problem: Respiratory: Goal: Respiratory status will improve Outcome: Progressing   Problem: Skin Integrity: Goal: Wound healing without signs and symptoms of infection Outcome: Progressing Goal: Risk for impaired skin integrity will decrease Outcome: Progressing   Problem: Urinary Elimination: Goal: Ability to achieve and maintain adequate renal perfusion and functioning will improve Outcome: Progressing

## 2024-04-12 NOTE — Progress Notes (Signed)
 NAME:  Jesse [name], MRN:  [id number], DOB:  [birth year], LOS: 2 ADMISSION DATE:  04/10/2024, CONSULTATION DATE: 04/11/2024 REFERRING MD: Dr. Shyrl, CHIEF COMPLAINT: Chest pain  History of Present Illness:  83 year old male with hypertension, hyperlipidemia, polycythemia vera status post phlebectomy last time on 7/15 who presented with chest pain on 7/19, was admitted with acute NSTEMI.  Patient underwent cardiac catheterization which showed multivessel coronary artery disease, today he underwent CABG x 3.  Patient remained intubated, was transferred to ICU, PCCM was consulted for evaluation and medical management  EBL:  Blood Administration: none Xclamp Time:  41 min Pump Time:   Pertinent  Medical History   Past Medical History:  Diagnosis Date   Depression    Hypercholesteremia    Hypertension    HZ (herpes zoster) 09/02/2011   Neuritis    left foot   Polycythemia rubra vera (HCC) 09/02/2011     Significant Hospital Events: Including procedures, antibiotic start and stop dates in addition to other pertinent events   7/20 CABG x3 7/21 now extubated, pacing wires out, stable   Interim History / Subjective:  Extubated successfully, awake on RA No pressor requirement or drips  Pacing wires removed  Approx 300cc CT output   Objective    Blood pressure (!) 101/53, pulse 78, temperature 100 F (37.8 C), resp. rate 18, height 5' 6 (1.676 m), weight 67.6 kg, SpO2 93%.    Vent Mode: PSV;CPAP FiO2 (%):  [40 %-50 %] 40 % Set Rate:  [115 bmp] 115 bmp Vt Set:  [510 mL] 510 mL PEEP:  [5 cmH20] 5 cmH20 Pressure Support:  [10 cmH20] 10 cmH20 Plateau Pressure:  [13 cmH20] 13 cmH20   Intake/Output Summary (Last 24 hours) at 04/12/2024 0817 Last data filed at 04/12/2024 0700 Gross per 24 hour  Intake 4606.1 ml  Output 4343 ml  Net 263.1 ml   Filed Weights   04/11/24 0840 04/12/24 0500  Weight: 64.5 kg 67.6 kg   General:  elderly M, sitting up in bed in  NAD HEENT: MM pink/moist Neuro: alert and oriented and moving all extremities CV: s1s2 rrr, no m/r/g, pleural and mediastinal CT in place, sternotomy dressing c/d/i PULM:  clear bilaterally on RA GI: soft, bsx4 active Extremities: warm/dry, no edema     Labs and images reviewed  Patient Lines/Drains/Airways Status     Active Line/Drains/Airways     Name Placement date Placement time Site Days   Arterial Line 04/11/24 Left Radial 04/11/24  1040  Radial  less than 1   Peripheral IV 04/10/24 18 G Anterior;Distal;Right;Upper Arm 04/10/24  1212  Arm  1   Peripheral IV 04/10/24 18 G Anterior;Left Forearm 04/10/24  1213  Forearm  1   NG/OG Vented/Dual Lumen Oral 04/11/24  1520  Oral  less than 1   Urethral Catheter M. Rebecka, RN Latex;Temperature probe 14 Fr. 04/11/24  1125  Latex;Temperature probe  less than 1   Y Chest Tube 1 and 2 1 Left Pleural 19 Fr. 2 Anterior Mediastinal 19 Fr. 04/11/24  1443  -- less than 1   Airway 8 mm 04/11/24  1125  -- less than 1   Wound 04/11/24 1224 Surgical Closed Surgical Incision Leg Right 04/11/24  1224  Leg  less than 1   Wound 04/11/24 1224 Surgical Closed Surgical Incision Chest Other (Comment) 04/11/24  1224  Chest  less than 1         Resolved problem list   Assessment and  Plan  Acute NSTEMI Multivessel coronary artery disease s/p CABG x 3 Continue aspirin  and statin Chest tube management TCTS Continue pain control with tramadol, oxycodone  and morphine  Pacing wires out Closely monitor chest tube output, CT left in place by TCTS today Lasix  initiated  Continue Asa, statin   Acute respiratory insufficiency, postop Continue on protective ventilation VAP prevention bundle in place Rapid weaning protocol ordered is in place  Chronic HFpEF Monitor intake and output EF 60 to 65% with grade 1 diastolic dysfunction Metoprolol  12.5mg  initiated   Hypertension Metoprolol  initiated   Hyperlipidemia Continue atorvastatin   Expected  perioperative blood loss anemia Monitor H/H and PLT counts  Polycythemia vera Last phlebotomy was on 7/15, outpatient follow-up with hematology Hgb 10   Best Practice (right click and Reselect all SmartList Selections daily)   Diet/type: regular  DVT prophylaxis: SCD GI prophylaxis: PPI Lines: Central line, Arterial Line, and yes and it is still needed Foley:  Yes, and it is still needed Code Status:  full code Last date of multidisciplinary goals of care discussion [Per primary team]   Labs   CBC: Recent Labs  Lab 04/06/24 1103 04/06/24 1103 04/10/24 1255 04/11/24 0428 04/11/24 1144 04/11/24 1404 04/11/24 1425 04/11/24 1553 04/11/24 1556 04/11/24 2035 04/11/24 2212 04/11/24 2217 04/12/24 0428  WBC 6.1  --  7.2 9.7  --   --   --  19.4*  --   --  14.9*  --  12.1*  NEUTROABS 3.6  --  5.2  --   --   --   --   --   --   --   --   --   --   HGB 17.2*   < > 13.1 14.9   < > 9.4*  9.2*   < > 10.2* 10.2* 8.8* 9.8* 9.9* 10.0*  HCT 52.1*  --  38.0* 43.8   < > 27.7*  27.0*   < > 30.7* 30.0* 26.0* 29.2* 29.0* 29.4*  MCV 95.2  --  93.4 94.2  --   --   --  97.2  --   --  97.0  --  96.4  PLT 223   < > 213 243  --  204  --  145*  --   --  147*  --  154   < > = values in this interval not displayed.    Basic Metabolic Panel: Recent Labs  Lab 04/06/24 1103 04/06/24 1103 04/10/24 1255 04/11/24 0428 04/11/24 1144 04/11/24 1404 04/11/24 1425 04/11/24 1441 04/11/24 1445 04/11/24 1556 04/11/24 2035 04/11/24 2212 04/11/24 2217 04/12/24 0428  NA 136  --  132* 133*   < > 134* 134*   < > 136 136 136 133* 133* 134*  K 4.8  --  3.3* 4.6   < > 5.1 4.9   < > 4.3 3.9 4.3 4.5 4.5 3.9  CL 102  --  104 106   < > 103 104  --  107  --   --  105  --  105  CO2 27  --  19* 19*  --   --   --   --   --   --   --  20*  --  22  GLUCOSE 105*  --  146* 93   < > 134* 132*  --  121*  --   --  182*  --  109*  BUN 17  --  14 10   < > 10 10  --  9  --   --  10  --  10  CREATININE 0.82   < > 0.75  0.65   < > 0.40* 0.40*  --  0.40*  --   --  0.65  --  0.55*  CALCIUM  10.5*  --  8.8* 9.2  --   --   --   --   --   --   --  8.2*  --  8.3*  MG  --   --  1.8  --   --   --   --   --   --   --   --  3.0*  --  2.4   < > = values in this interval not displayed.   GFR: Estimated Creatinine Clearance: 65.4 mL/min (A) (by C-G formula based on SCr of 0.55 mg/dL (L)). Recent Labs  Lab 04/11/24 0428 04/11/24 1553 04/11/24 2212 04/12/24 0428  WBC 9.7 19.4* 14.9* 12.1*    Liver Function Tests: Recent Labs  Lab 04/06/24 1103 04/10/24 1255  AST 13* 17  ALT 13 14  ALKPHOS 59 44  BILITOT 1.6* 1.3*  PROT 6.7 5.1*  ALBUMIN  4.1 2.8*   No results for input(s): LIPASE, AMYLASE in the last 168 hours. No results for input(s): AMMONIA in the last 168 hours.  ABG    Component Value Date/Time   PHART 7.374 04/11/2024 2217   PCO2ART 34.1 04/11/2024 2217   PO2ART 110 (H) 04/11/2024 2217   HCO3 19.9 (L) 04/11/2024 2217   TCO2 21 (L) 04/11/2024 2217   ACIDBASEDEF 5.0 (H) 04/11/2024 2217   O2SAT 98 04/11/2024 2217     Coagulation Profile: Recent Labs  Lab 04/11/24 1553  INR 2.2*    Cardiac Enzymes: No results for input(s): CKTOTAL, CKMB, CKMBINDEX, TROPONINI in the last 168 hours.  HbA1C: Hgb A1c MFr Bld  Date/Time Value Ref Range Status  04/10/2024 01:30 PM 4.9 4.8 - 5.6 % Final    Comment:    (NOTE) Diagnosis of Diabetes The following HbA1c ranges recommended by the American Diabetes Association (ADA) may be used as an aid in the diagnosis of diabetes mellitus.  Hemoglobin             Suggested A1C NGSP%              Diagnosis  <5.7                   Non Diabetic  5.7-6.4                Pre-Diabetic  >6.4                   Diabetic  <7.0                   Glycemic control for                       adults with diabetes.      CBG: Recent Labs  Lab 04/12/24 0115 04/12/24 0207 04/12/24 0302 04/12/24 0421 04/12/24 0802  GLUCAP 118* 113* 114* 105*  103*    Review of Systems:   Unable to obtain as patient is intubated and sedated  Past Medical History:  He,  has a past medical history of Depression, Hypercholesteremia, Hypertension, HZ (herpes zoster) (09/02/2011), Neuritis, and Polycythemia rubra vera (HCC) (09/02/2011).   Surgical History:   Past Surgical History:  Procedure Laterality Date   CORONARY/GRAFT ACUTE MI REVASCULARIZATION N/A 04/10/2024  Procedure: Coronary/Graft Acute MI Revascularization;  Surgeon: Ladona Heinz, MD;  Location: [facility];  Service: Cardiovascular;  Laterality: N/A;   GAS INSERTION  06/04/2012   Procedure: INSERTION OF GAS;  Surgeon: Norleen JONETTA Ku, MD;  Location: [facility] OR;  Service: Ophthalmology;  Laterality: Left;  [id number]   LEFT HEART CATH AND CORONARY ANGIOGRAPHY N/A 04/10/2024   Procedure: LEFT HEART CATH AND CORONARY ANGIOGRAPHY;  Surgeon: Ladona Heinz, MD;  Location: [facility];  Service: Cardiovascular;  Laterality: N/A;   SCLERAL BUCKLE  06/04/2012   Procedure: SCLERAL BUCKLE;  Surgeon: Norleen JONETTA Ku, MD;  Location: [facility] OR;  Service: Ophthalmology;  Laterality: Left;  Headscope laser     Social History:   reports that he has never smoked. He has never used smokeless tobacco. He reports that he does not drink alcohol and does not use drugs.   Family History:  His family history includes Heart attack in his mother.   Allergies Allergies  Allergen Reactions   Hctz [Hydrochlorothiazide ] Other (See Comments)    Hyponatremia ; Na+ 111, w weakness resulting in mechanical fall   Tramadol Nausea And Vomiting     Home Medications  Prior to Admission medications   Medication Sig Start Date End Date Taking? Authorizing Provider  ALPRAZolam  (XANAX ) 1 MG tablet Take 0.5 mg by mouth at bedtime. 03/13/21  Yes [provider]  aspirin  81 MG chewable tablet Chew 324 mg by mouth as needed for mild pain (pain score 1-3) or moderate pain (pain score 4-6).   Yes [provider]   Cholecalciferol  50 MCG (2000 UT) CAPS Take 2,000 Units by mouth daily.   Yes [provider]  fish oil-omega-3 fatty acids 1000 MG capsule Take 1 g by mouth every other day.   Yes [provider]  gabapentin  (NEURONTIN ) 100 MG capsule Take 2 capsules (200 mg total) by mouth 3 (three) times daily. Patient taking differently: Take 200 mg by mouth at bedtime. 10/13/23 04/10/24 Yes Georgina Ozell LABOR, MD  lisinopril -hydrochlorothiazide  (ZESTORETIC ) 20-12.5 MG tablet Take 1 tablet by mouth daily.   Yes [provider]  naproxen  (NAPROSYN ) 500 MG tablet TAKE 1 TABLET BY MOUTH TWICE A DAY WITH FOOD 03/15/24  Yes Georgina Ozell LABOR, MD  simvastatin  (ZOCOR ) 20 MG tablet Take 1 tablet (20 mg total) by mouth daily at 6 PM. Patient taking differently: Take 20 mg by mouth in the morning. 03/05/21  Yes Medina-Vargas, Monina C, NP  tamsulosin  (FLOMAX ) 0.4 MG CAPS capsule Take 0.4 mg by mouth daily. 03/26/21  Yes [provider]  vitamin B-12 (CYANOCOBALAMIN ) 100 MCG tablet Take 100 mcg by mouth daily.   Yes [provider]  celecoxib  (CELEBREX ) 100 MG capsule TAKE 1 CAPSULE BY MOUTH TWICE A DAY Patient not taking: Reported on 04/06/2024 12/18/23   Georgina Ozell LABOR, MD     Critical care time:      The patient is critically ill due to acute NSTEMI/multivessel coronary artery disease status post CABG x 3/acute respiratory insufficiency, requiring titration of ventilator.  Critical care was necessary to treat or prevent imminent or life-threatening deterioration.  Critical care was time spent personally by me on the following activities: development of treatment plan with patient and/or surrogate as well as nursing, discussions with consultants, evaluation of patient's response to treatment, examination of patient, obtaining history from patient or surrogate, ordering and performing treatments and interventions, ordering and review of laboratory studies, ordering and review of  radiographic studies, pulse  oximetry, re-evaluation of patient's condition and participation in multidisciplinary rounds.   During this encounter critical care time was devoted to patient care services described in this note for 39 minutes.    Leita SAUNDERS Any Mcneice, PA-C [facility] See Amion for pager If no response to pager , please call [phone] until 7pm After 7:00 pm call Elink  [phone]

## 2024-04-12 NOTE — TOC Initial Note (Signed)
 Transition of Care [facility]) - Initial/Assessment Note    Patient Details  Name: Jesse [name] MRN: [id number] Date of Birth: [birth year]  [facility] CM/SW Contact:    Justina Delcia Czar, RN Phone Number: [phone] 04/12/2024, 4:54 PM  Clinical Narrative:                 Spoke to pt and will have friend at time of dc. Offered choice for [facility]. Medicare.gov list with rating given. Pt agreeable to [facility] for [facility]. Has RW at home. Will continue to follow for dc needs.   Expected Discharge Plan: Home w Home Health Services Barriers to Discharge: Continued Medical Work up   Patient Goals and CMS Choice Patient states their goals for this hospitalization and ongoing recovery are:: wants to get better and go home CMS Medicare.gov Compare Post Acute Care list provided to:: Patient Choice offered to / list presented to : Patient      Expected Discharge Plan and Services   Discharge Planning Services: CM Consult Post Acute Care Choice: Home Health Living arrangements for the past 2 months: Single Family Home                                      Prior Living Arrangements/Services Living arrangements for the past 2 months: Single Family Home Lives with:: Self Patient language and need for interpreter reviewed:: Yes Do you feel safe going back to the place where you live?: Yes      Need for Family Participation in Patient Care: Yes (Comment) Care giver support system in place?: Yes (comment) Current home services: DME (rolling walker) Criminal Activity/Legal Involvement Pertinent to Current Situation/Hospitalization: No - Comment as needed  Activities of Daily Living   ADL Screening (condition at time of admission) Independently performs ADLs?: Yes (appropriate for developmental age) Is the patient deaf or have difficulty hearing?: No Does the patient have difficulty seeing, even when wearing glasses/contacts?: No Does the patient have difficulty  concentrating, remembering, or making decisions?: No  Permission Sought/Granted Permission sought to share information with : Case Manager, Family Supports, PCP Permission granted to share information with : Yes, Verbal Permission Granted  Share Information with NAME: Avelina Gin  Permission granted to share info w AGENCY: Home Health, PCP, DME  Permission granted to share info w Relationship: Sister  Permission granted to share info w Contact Information: [phone]  Emotional Assessment Appearance:: Appears stated age Attitude/Demeanor/Rapport: Engaged Affect (typically observed): Accepting Orientation: : Oriented to Place, Oriented to Self, Oriented to  Time, Oriented to Situation   Psych Involvement: No (comment)  Admission diagnosis:  Other type of myocardial infarction (HCC) [P78.A9] NSTEMI (non-ST elevated myocardial infarction) (HCC) [I21.4] S/P CABG x 3 [Z95.1] Patient Active Problem List   Diagnosis Date Noted   S/P CABG x 3 04/11/2024   NSTEMI (non-ST elevated myocardial infarction) (HCC) 04/10/2024   UPJ obstruction, acquired 03/02/2021   Diverticulosis 03/02/2021   Cervical spondylosis 03/02/2021   Unspecified protein-calorie malnutrition (HCC) 03/02/2021   Transaminitis 03/02/2021   Hyponatremia 02/22/2021   Essential hypertension 02/22/2021   Hyperlipidemia 02/22/2021   Fall at home, subsequent encounter 02/22/2021   Need for prophylactic vaccination and inoculation against influenza 06/17/2016   Polycythemia rubra vera (HCC) 09/02/2011   HZ (herpes zoster) 09/02/2011   PCP:  Ransom Other, MD Pharmacy:   [facility] [phone] - [place], [place] - [address]  AT [facility] [address] [place] [address] Phone: [phone] Fax: [phone]     Social Drivers of Health (SDOH) Social History: SDOH Screenings   Food Insecurity: No Food Insecurity (04/10/2024)  Housing: Low Risk  (04/10/2024)   Transportation Needs: No Transportation Needs (04/10/2024)  Utilities: Not At Risk (04/10/2024)  Depression (PHQ2-9): Low Risk  (04/06/2024)  Social Connections: Socially Isolated (04/11/2024)  Tobacco Use: Low Risk  (04/11/2024)   SDOH Interventions:     Readmission Risk Interventions     No data to display

## 2024-04-12 NOTE — Anesthesia Postprocedure Evaluation (Signed)
 Anesthesia Post Note  Patient: Jesse [name]  Procedure(s) Performed: CORONARY ARTERY BYPASS GRAFTING (CABG) X THREE, USING LEFT INTERNAL MAMMARY ARTERY AND RIGHT LEG GREATER SAPHENOUS VEIN HARVESTED ENDOSCOPICALLY (Chest) ECHOCARDIOGRAM, TRANSESOPHAGEAL, INTRAOPERATIVE     Patient location during evaluation: SICU Anesthesia Type: General Level of consciousness: sedated Pain management: pain level controlled Vital Signs Assessment: post-procedure vital signs reviewed and stable Respiratory status: spontaneous breathing Cardiovascular status: stable Anesthetic complications: no   No notable events documented.  Last Vitals:  Vitals:   04/12/24 1730 04/12/24 1755  BP: 110/61   Pulse: 77 81  Resp: 20 20  Temp: 37.7 C 37.9 C  SpO2: 94% 94%    Last Pain:  Vitals:   04/12/24 1715  TempSrc:   PainSc: 7                  Norleen Pope

## 2024-04-12 NOTE — Progress Notes (Signed)
 Patient ID: Jesse [name], male   DOB: 1941-04-22, 83 y.o.   MRN: [id number]  TCTS Evening Rounds:  Hemodynamically stable in sinus rhythm.  Sats 94% RA.  UO ok  CT output low.  Up in chair.  BMET    Component Value Date/Time   NA 130 (L) 04/12/2024 1621   NA 136 08/19/2017 1149   NA 135 (L) 10/21/2016 1151   K 4.0 04/12/2024 1621   K 3.8 08/19/2017 1149   K 3.9 10/21/2016 1151   CL 99 04/12/2024 1621   CL 95 (L) 08/19/2017 1149   CO2 21 (L) 04/12/2024 1621   CO2 28 08/19/2017 1149   CO2 25 10/21/2016 1151   GLUCOSE 135 (H) 04/12/2024 1621   GLUCOSE 114 08/19/2017 1149   BUN 13 04/12/2024 1621   BUN 14 08/19/2017 1149   BUN 13.6 10/21/2016 1151   CREATININE 0.73 04/12/2024 1621   CREATININE 0.82 04/06/2024 1103   CREATININE 1.0 08/19/2017 1149   CREATININE 0.8 10/21/2016 1151   CALCIUM  8.5 (L) 04/12/2024 1621   CALCIUM  10.1 08/19/2017 1149   CALCIUM  10.3 10/21/2016 1151   EGFR 87 (L) 10/21/2016 1151   GFRNONAA >60 04/12/2024 1621   GFRNONAA >60 04/06/2024 1103   CBC    Component Value Date/Time   WBC 14.6 (H) 04/12/2024 1621   RBC 3.25 (L) 04/12/2024 1621   HGB 10.7 (L) 04/12/2024 1621   HGB 17.2 (H) 04/06/2024 1103   HGB 16.1 08/19/2017 1149   HGB 14.4 03/24/2008 0919   HCT 31.7 (L) 04/12/2024 1621   HCT 46.9 08/19/2017 1149   HCT 44.1 03/24/2008 0919   PLT 165 04/12/2024 1621   PLT 223 04/06/2024 1103   PLT 251 08/19/2017 1149   PLT 310 03/24/2008 0919   MCV 97.5 04/12/2024 1621   MCV 89 08/19/2017 1149   MCV 76.3 (L) 03/24/2008 0919   MCH 32.9 04/12/2024 1621   MCHC 33.8 04/12/2024 1621   RDW 14.4 04/12/2024 1621   RDW 14.9 08/19/2017 1149   RDW 19.1 (H) 03/24/2008 0919   LYMPHSABS 1.5 04/10/2024 1255   LYMPHSABS 2.2 08/19/2017 1149   LYMPHSABS 2.0 03/24/2008 0919   MONOABS 0.5 04/10/2024 1255   MONOABS 1.1 (H) 03/24/2008 0919   EOSABS 0.0 04/10/2024 1255   EOSABS 0.0 08/19/2017 1149   BASOSABS 0.0 04/10/2024 1255   BASOSABS 0.0 08/19/2017  1149   BASOSABS 0.0 03/24/2008 0919

## 2024-04-13 ENCOUNTER — Inpatient Hospital Stay (HOSPITAL_COMMUNITY)

## 2024-04-13 DIAGNOSIS — I5032 Chronic diastolic (congestive) heart failure: Secondary | ICD-10-CM | POA: Diagnosis not present

## 2024-04-13 DIAGNOSIS — Z951 Presence of aortocoronary bypass graft: Secondary | ICD-10-CM | POA: Diagnosis not present

## 2024-04-13 DIAGNOSIS — I214 Non-ST elevation (NSTEMI) myocardial infarction: Secondary | ICD-10-CM | POA: Diagnosis not present

## 2024-04-13 DIAGNOSIS — I10 Essential (primary) hypertension: Secondary | ICD-10-CM | POA: Diagnosis not present

## 2024-04-13 LAB — CBC
HCT: 31.3 % — ABNORMAL LOW (ref 39.0–52.0)
Hemoglobin: 10.8 g/dL — ABNORMAL LOW (ref 13.0–17.0)
MCH: 33.2 pg (ref 26.0–34.0)
MCHC: 34.5 g/dL (ref 30.0–36.0)
MCV: 96.3 fL (ref 80.0–100.0)
Platelets: 148 K/uL — ABNORMAL LOW (ref 150–400)
RBC: 3.25 MIL/uL — ABNORMAL LOW (ref 4.22–5.81)
RDW: 14.2 % (ref 11.5–15.5)
WBC: 11.3 K/uL — ABNORMAL HIGH (ref 4.0–10.5)
nRBC: 0 % (ref 0.0–0.2)

## 2024-04-13 LAB — BASIC METABOLIC PANEL WITH GFR
Anion gap: 5 (ref 5–15)
BUN: 15 mg/dL (ref 8–23)
CO2: 25 mmol/L (ref 22–32)
Calcium: 8.8 mg/dL — ABNORMAL LOW (ref 8.9–10.3)
Chloride: 99 mmol/L (ref 98–111)
Creatinine, Ser: 0.78 mg/dL (ref 0.61–1.24)
GFR, Estimated: >60 mL/min (ref >60)
Glucose, Bld: 95 mg/dL (ref 70–99)
Potassium: 3.6 mmol/L (ref 3.5–5.1)
Sodium: 129 mmol/L — ABNORMAL LOW (ref 135–145)

## 2024-04-13 LAB — LIPOPROTEIN A (LPA): Lipoprotein (a): <8.4 nmol/L (ref <75.0)

## 2024-04-13 LAB — ECHO INTRAOPERATIVE TEE
AV Mean grad: 2 mmHg
AV Peak grad: 4.2 mmHg
AV Vena cont: 0.2 cm
Ao pk vel: 1.02 m/s
Height: 66 in
P 1/2 time: 550 ms
Weight: 2275.15 [oz_av]

## 2024-04-13 LAB — GLUCOSE, CAPILLARY
Glucose-Capillary: 102 mg/dL — ABNORMAL HIGH (ref 70–99)
Glucose-Capillary: 111 mg/dL — ABNORMAL HIGH (ref 70–99)

## 2024-04-13 MED ORDER — SODIUM CHLORIDE 0.9 % IV SOLN: 250.0000 mL | INTRAVENOUS | Status: AC | PRN

## 2024-04-13 MED ORDER — SODIUM CHLORIDE 0.9% FLUSH
3.0000 mL | INTRAVENOUS | Status: DC | PRN
  Administered 2024-04-14: 3 mL via INTRAVENOUS

## 2024-04-13 MED ORDER — POTASSIUM CHLORIDE CRYS ER 20 MEQ PO TBCR
20.0000 meq | EXTENDED_RELEASE_TABLET | ORAL | Status: AC
  Administered 2024-04-13 (×3): 20 meq via ORAL
  Filled 2024-04-13 (×3): qty 1

## 2024-04-13 MED ORDER — ~~LOC~~ CARDIAC SURGERY, PATIENT & FAMILY EDUCATION: Freq: Once | Status: DC

## 2024-04-13 MED ORDER — FUROSEMIDE 40 MG PO TABS
40.0000 mg | ORAL_TABLET | Freq: Every day | ORAL | Status: DC
  Administered 2024-04-13: 40 mg via ORAL
  Filled 2024-04-13: qty 1

## 2024-04-13 MED ORDER — SODIUM CHLORIDE 0.9% FLUSH
3.0000 mL | Freq: Two times a day (BID) | INTRAVENOUS | Status: DC
  Administered 2024-04-13 – 2024-04-23 (×18): 3 mL via INTRAVENOUS

## 2024-04-13 NOTE — Progress Notes (Addendum)
 [address] [place] [address]             [phone]         2 Days Post-Op Procedure(s) (LRB): CORONARY ARTERY BYPASS GRAFTING (CABG) X THREE, USING LEFT INTERNAL MAMMARY ARTERY AND RIGHT LEG GREATER SAPHENOUS VEIN HARVESTED ENDOSCOPICALLY (N/A) ECHOCARDIOGRAM, TRANSESOPHAGEAL, INTRAOPERATIVE  Subjective:  Patient sitting up in chair,  he is a little tired.  Pain is well controlled.  + passing gas, no BM  Objective: Vital signs in last 24 hours: Temp:  [99.7 F (37.6 C)-101.3 F (38.5 C)] 100 F (37.8 C) (07/22 0600) Pulse Rate:  [72-102] 97 (07/22 0700) Cardiac Rhythm: Normal sinus rhythm (07/22 0400) Resp:  [11-32] 24 (07/22 0700) BP: (81-129)/(50-80) 116/80 (07/22 0700) SpO2:  [91 %-95 %] 95 % (07/22 0700) Arterial Line BP: (129-147)/(45-59) 147/59 (07/21 0930) Weight:  [65.3 kg] 65.3 kg (07/22 0500)  Intake/Output from previous day: 07/21 0701 - 07/22 0700 In: 130 [I.V.:30; IV Piggyback:100] Out: 1860 [Urine:1600; Chest Tube:260]  General appearance: alert, cooperative, and no distress Heart: regular rate and rhythm and tach Lungs: diminished breath sounds bibasilar Abdomen: soft, non-tender; bowel sounds normal; no masses,  no organomegaly Extremities: edema none Wound: aquacel in place, RLE EVH sites C/D/I, ecchymosis present  Lab Results: Recent Labs    04/12/24 1621 04/13/24 0406  WBC 14.6* 11.3*  HGB 10.7* 10.8*  HCT 31.7* 31.3*  PLT 165 148*   BMET:  Recent Labs    04/12/24 1621 04/13/24 0406  NA 130* 129*  K 4.0 3.6  CL 99 99  CO2 21* 25  GLUCOSE 135* 95  BUN 13 15  CREATININE 0.73 0.78  CALCIUM  8.5* 8.8*    PT/INR:  Recent Labs    04/11/24 1553  LABPROT 25.2*  INR 2.2*   ABG    Component Value Date/Time   PHART 7.374 04/11/2024 2217   HCO3 19.9 (L) 04/11/2024 2217   TCO2 21 (L) 04/11/2024 2217   ACIDBASEDEF 5.0 (H) 04/11/2024 2217   O2SAT 98 04/11/2024 2217   CBG (last 3)  Recent Labs     04/12/24 2037 04/12/24 2339 04/13/24 0405  GLUCAP 122* 110* 102*    Assessment/Plan: S/P Procedure(s) (LRB): CORONARY ARTERY BYPASS GRAFTING (CABG) X THREE, USING LEFT INTERNAL MAMMARY ARTERY AND RIGHT LEG GREATER SAPHENOUS VEIN HARVESTED ENDOSCOPICALLY (N/A) ECHOCARDIOGRAM, TRANSESOPHAGEAL, INTRAOPERATIVE  CV- Sinus Tachy, BP is labile- continue Lopressor  at 12.5 mg BID, would benefit from titration for additional HR control, however BP is prohibitive currently.Patient is asked to monitor BP at home or work, several times per month and return with written values at next office visit. Pulm- CT w/o air leak, output remains less than 250 cc, will remove chest tubes today..chest X-ray in AM Renal- creatinine normal, K is normal at 3.6.[name] will transition to oral Lasix , potassium Chronic Hyponatremia- Na is at 129.. monitor for now.. if continues to decrease will replace with Tolvaptan H/O Polycythemia vera- HCT okay at 31%, Hgb stable at 10.3 CBGs- controlled, patient is not a diabetic, will d/c SSIP Dispo- patient stable, doing well, transfer to 4E, d/c chest tubes, foley, central line   LOS: 3 days    Rocky Shad, PA-C 04/13/2024 7:54 AM   Chart reviewed, patient examined, agree with above.  He looks good overall. BP dropped to 80's with walk and felt nauseous but had received BB and lasix  this am. He was orthostatic this am but it  should get better with continued mobilization.

## 2024-04-13 NOTE — Progress Notes (Signed)
 NAME:  Jesse [name], MRN:  [id number], DOB:  [birth year], LOS: 3 ADMISSION DATE:  04/10/2024, CONSULTATION DATE: 04/11/2024 REFERRING MD: Dr. Shyrl, CHIEF COMPLAINT: Chest pain  History of Present Illness:  83 year old male with hypertension, hyperlipidemia, polycythemia vera status post phlebectomy last time on 7/15 who presented with chest pain on 7/19, was admitted with acute NSTEMI.  Patient underwent cardiac catheterization which showed multivessel coronary artery disease, today he underwent CABG x 3.  Patient remained intubated, was transferred to ICU, PCCM was consulted for evaluation and medical management  EBL:  Blood Administration: none Xclamp Time:  41 min Pump Time:   Pertinent  Medical History   Past Medical History:  Diagnosis Date   Depression    Hypercholesteremia    Hypertension    HZ (herpes zoster) 09/02/2011   Neuritis    left foot   Polycythemia rubra vera (HCC) 09/02/2011     Significant Hospital Events: Including procedures, antibiotic start and stop dates in addition to other pertinent events   7/20 CABG x3 7/21 now extubated, pacing wires out, stable   Interim History / Subjective:  No overnight events, feels pretty well, 50cc CT output Plan to transfer out of ICU today  Objective    Blood pressure 121/70, pulse 96, temperature 99 F (37.2 C), temperature source Oral, resp. rate 17, height 5' 6 (1.676 m), weight 65.3 kg, SpO2 94%.        Intake/Output Summary (Last 24 hours) at 04/13/2024 9167 Last data filed at 04/13/2024 0800 Gross per 24 hour  Intake 330 ml  Output 1918 ml  Net -1588 ml   Filed Weights   04/11/24 0840 04/12/24 0500 04/13/24 0500  Weight: 64.5 kg 67.6 kg 65.3 kg   General:  elderly M, sitting up in the chair in NAD HEENT: MM pink/moist Neuro: alert and oriented and moving all extremities CV: s1s2 rrr, no m/r/g, pleural and mediastinal CT in place, sternotomy dressing c/d/i PULM:  clear bilaterally on  RA GI: soft, bsx4 active Extremities: warm/dry, no edema     Labs and images reviewed  Patient Lines/Drains/Airways Status     Active Line/Drains/Airways     Name Placement date Placement time Site Days   Arterial Line 04/11/24 Left Radial 04/11/24  1040  Radial  less than 1   Peripheral IV 04/10/24 18 G Anterior;Distal;Right;Upper Arm 04/10/24  1212  Arm  1   Peripheral IV 04/10/24 18 G Anterior;Left Forearm 04/10/24  1213  Forearm  1   NG/OG Vented/Dual Lumen Oral 04/11/24  1520  Oral  less than 1   Urethral Catheter M. Rebecka, RN Latex;Temperature probe 14 Fr. 04/11/24  1125  Latex;Temperature probe  less than 1   Y Chest Tube 1 and 2 1 Left Pleural 19 Fr. 2 Anterior Mediastinal 19 Fr. 04/11/24  1443  -- less than 1   Airway 8 mm 04/11/24  1125  -- less than 1   Wound 04/11/24 1224 Surgical Closed Surgical Incision Leg Right 04/11/24  1224  Leg  less than 1   Wound 04/11/24 1224 Surgical Closed Surgical Incision Chest Other (Comment) 04/11/24  1224  Chest  less than 1         Resolved problem list   Assessment and Plan  Acute NSTEMI Multivessel coronary artery disease s/p CABG x 3 Continue aspirin  and statin Chest tube management TCTS, plan to remove today Continue pain control with tramadol, oxycodone  and morphine  Pacing wires out, plan to remove foley and  CVC Continue metoprolol  Lasix  initiated, 1cc/kg/hr UOP Continue Asa, statin   Acute respiratory insufficiency, postop Continue on protective ventilation VAP prevention bundle in place Rapid weaning protocol ordered is in place  Chronic HFpEF Monitor intake and output EF 60 to 65% with grade 1 diastolic dysfunction Metoprolol  12.5mg  initiated   Hypertension Metoprolol  initiated   Hyperlipidemia Continue atorvastatin   Expected perioperative blood loss anemia Monitor H/H and PLT counts  Polycythemia vera Last phlebotomy was on 7/15, outpatient follow-up with hematology Hgb 10.8   Best Practice (right  click and Reselect all SmartList Selections daily)   Diet/type: regular  DVT prophylaxis: SCD GI prophylaxis: PPI Lines: Central line, Arterial Line, removal ordered Foley:  Yes, no longer needed and removal ordered Code Status:  full code Last date of multidisciplinary goals of care discussion [Per primary team]   Labs   CBC: Recent Labs  Lab 04/06/24 1103 04/10/24 1255 04/11/24 0428 04/11/24 1553 04/11/24 1556 04/11/24 2212 04/11/24 2217 04/12/24 0428 04/12/24 1621 04/13/24 0406  WBC 6.1 7.2   < > 19.4*  --  14.9*  --  12.1* 14.6* 11.3*  NEUTROABS 3.6 5.2  --   --   --   --   --   --   --   --   HGB 17.2* 13.1   < > 10.2*   < > 9.8* 9.9* 10.0* 10.7* 10.8*  HCT 52.1* 38.0*   < > 30.7*   < > 29.2* 29.0* 29.4* 31.7* 31.3*  MCV 95.2 93.4   < > 97.2  --  97.0  --  96.4 97.5 96.3  PLT 223 213   < > 145*  --  147*  --  154 165 148*   < > = values in this interval not displayed.    Basic Metabolic Panel: Recent Labs  Lab 04/10/24 1255 04/11/24 0428 04/11/24 1144 04/11/24 1445 04/11/24 1556 04/11/24 2212 04/11/24 2217 04/12/24 0428 04/12/24 1621 04/13/24 0406  NA 132* 133*   < > 136   < > 133* 133* 134* 130* 129*  K 3.3* 4.6   < > 4.3   < > 4.5 4.5 3.9 4.0 3.6  CL 104 106   < > 107  --  105  --  105 99 99  CO2 19* 19*  --   --   --  20*  --  22 21* 25  GLUCOSE 146* 93   < > 121*  --  182*  --  109* 135* 95  BUN 14 10   < > 9  --  10  --  10 13 15   CREATININE 0.75 0.65   < > 0.40*  --  0.65  --  0.55* 0.73 0.78  CALCIUM  8.8* 9.2  --   --   --  8.2*  --  8.3* 8.5* 8.8*  MG 1.8  --   --   --   --  3.0*  --  2.4 2.2  --    < > = values in this interval not displayed.   GFR: Estimated Creatinine Clearance: 65.4 mL/min (by C-G formula based on SCr of 0.78 mg/dL). Recent Labs  Lab 04/11/24 2212 04/12/24 0428 04/12/24 1621 04/13/24 0406  WBC 14.9* 12.1* 14.6* 11.3*    Liver Function Tests: Recent Labs  Lab 04/06/24 1103 04/10/24 1255  AST 13* 17  ALT 13  14  ALKPHOS 59 44  BILITOT 1.6* 1.3*  PROT 6.7 5.1*  ALBUMIN  4.1 2.8*   No results for  input(s): LIPASE, AMYLASE in the last 168 hours. No results for input(s): AMMONIA in the last 168 hours.  ABG    Component Value Date/Time   PHART 7.374 04/11/2024 2217   PCO2ART 34.1 04/11/2024 2217   PO2ART 110 (H) 04/11/2024 2217   HCO3 19.9 (L) 04/11/2024 2217   TCO2 21 (L) 04/11/2024 2217   ACIDBASEDEF 5.0 (H) 04/11/2024 2217   O2SAT 98 04/11/2024 2217     Coagulation Profile: Recent Labs  Lab 04/11/24 1553  INR 2.2*    Cardiac Enzymes: No results for input(s): CKTOTAL, CKMB, CKMBINDEX, TROPONINI in the last 168 hours.  HbA1C: Hgb A1c MFr Bld  Date/Time Value Ref Range Status  04/10/2024 01:30 PM 4.9 4.8 - 5.6 % Final    Comment:    (NOTE) Diagnosis of Diabetes The following HbA1c ranges recommended by the American Diabetes Association (ADA) may be used as an aid in the diagnosis of diabetes mellitus.  Hemoglobin             Suggested A1C NGSP%              Diagnosis  <5.7                   Non Diabetic  5.7-6.4                Pre-Diabetic  >6.4                   Diabetic  <7.0                   Glycemic control for                       adults with diabetes.      CBG: Recent Labs  Lab 04/12/24 1630 04/12/24 2037 04/12/24 2339 04/13/24 0405 04/13/24 0753  GLUCAP 132* 122* 110* 102* 111*    Review of Systems:   Unable to obtain as patient is intubated and sedated  Past Medical History:  He,  has a past medical history of Depression, Hypercholesteremia, Hypertension, HZ (herpes zoster) (09/02/2011), Neuritis, and Polycythemia rubra vera (HCC) (09/02/2011).   Surgical History:   Past Surgical History:  Procedure Laterality Date   CORONARY ARTERY BYPASS GRAFT N/A 04/11/2024   Procedure: CORONARY ARTERY BYPASS GRAFTING (CABG) X THREE, USING LEFT INTERNAL MAMMARY ARTERY AND RIGHT LEG GREATER SAPHENOUS VEIN HARVESTED ENDOSCOPICALLY;  Surgeon:  Shyrl Linnie KIDD, MD;  Location: [facility] OR;  Service: Open Heart Surgery;  Laterality: N/A;   CORONARY/GRAFT ACUTE MI REVASCULARIZATION N/A 04/10/2024   Procedure: Coronary/Graft Acute MI Revascularization;  Surgeon: Ladona Heinz, MD;  Location: [facility] INVASIVE CV LAB;  Service: Cardiovascular;  Laterality: N/A;   GAS INSERTION  06/04/2012   Procedure: INSERTION OF GAS;  Surgeon: Norleen JONETTA Ku, MD;  Location: [facility] OR;  Service: Ophthalmology;  Laterality: Left;  C3F8   INTRAOPERATIVE TRANSESOPHAGEAL ECHOCARDIOGRAM  04/11/2024   Procedure: ECHOCARDIOGRAM, TRANSESOPHAGEAL, INTRAOPERATIVE;  Surgeon: Shyrl Linnie KIDD, MD;  Location: [facility] OR;  Service: Open Heart Surgery;;   LEFT HEART CATH AND CORONARY ANGIOGRAPHY N/A 04/10/2024   Procedure: LEFT HEART CATH AND CORONARY ANGIOGRAPHY;  Surgeon: Ladona Heinz, MD;  Location: [facility] INVASIVE CV LAB;  Service: Cardiovascular;  Laterality: N/A;   SCLERAL BUCKLE  06/04/2012   Procedure: SCLERAL BUCKLE;  Surgeon: Norleen JONETTA Ku, MD;  Location: [facility] OR;  Service: Ophthalmology;  Laterality: Left;  Headscope laser     Social History:   reports  that he has never smoked. He has never used smokeless tobacco. He reports that he does not drink alcohol and does not use drugs.   Family History:  His family history includes Heart attack in his mother.   Allergies Allergies  Allergen Reactions   Hctz [Hydrochlorothiazide ] Other (See Comments)    Hyponatremia ; Na+ 111, w weakness resulting in mechanical fall   Tramadol Nausea And Vomiting     Home Medications  Prior to Admission medications   Medication Sig Start Date End Date Taking? Authorizing Provider  ALPRAZolam  (XANAX ) 1 MG tablet Take 0.5 mg by mouth at bedtime. 03/13/21  Yes [provider]  aspirin  81 MG chewable tablet Chew 324 mg by mouth as needed for mild pain (pain score 1-3) or moderate pain (pain score 4-6).   Yes [provider]  Cholecalciferol  50 MCG (2000 UT) CAPS Take 2,000 Units by  mouth daily.   Yes [provider]  fish oil-omega-3 fatty acids 1000 MG capsule Take 1 g by mouth every other day.   Yes [provider]  gabapentin  (NEURONTIN ) 100 MG capsule Take 2 capsules (200 mg total) by mouth 3 (three) times daily. Patient taking differently: Take 200 mg by mouth at bedtime. 10/13/23 04/10/24 Yes Georgina Ozell LABOR, MD  lisinopril -hydrochlorothiazide  (ZESTORETIC ) 20-12.5 MG tablet Take 1 tablet by mouth daily.   Yes [provider]  naproxen  (NAPROSYN ) 500 MG tablet TAKE 1 TABLET BY MOUTH TWICE A DAY WITH FOOD 03/15/24  Yes Georgina Ozell LABOR, MD  simvastatin  (ZOCOR ) 20 MG tablet Take 1 tablet (20 mg total) by mouth daily at 6 PM. Patient taking differently: Take 20 mg by mouth in the morning. 03/05/21  Yes Medina-Vargas, Monina C, NP  tamsulosin  (FLOMAX ) 0.4 MG CAPS capsule Take 0.4 mg by mouth daily. 03/26/21  Yes [provider]  vitamin B-12 (CYANOCOBALAMIN ) 100 MCG tablet Take 100 mcg by mouth daily.   Yes [provider]  celecoxib  (CELEBREX ) 100 MG capsule TAKE 1 CAPSULE BY MOUTH TWICE A DAY Patient not taking: Reported on 04/06/2024 12/18/23   Georgina Ozell LABOR, MD     Critical care time:       Leita SAUNDERS Cranford Blessinger, PA-C [facility] See Amion for pager If no response to pager , please call [phone] until 7pm After 7:00 pm call Elink  [phone]

## 2024-04-13 NOTE — Evaluation (Signed)
 Physical Therapy Evaluation Patient Details Name: Jesse [name] MRN: [id number] DOB: October 25, 1940 Today's Date: 04/13/2024  History of Present Illness  83 year old male admitted 7/19  with acute NSTEMI.  Patient underwent cardiac catheterization which showed multivessel coronary artery disease, underwent CABG x 3 7/20. PMH: hypertension, hyperlipidemia, polycythemia vera status post phlebectomy last time on 7/15  Clinical Impression  Pt admitted with above diagnosis. Pt limited by drop in BP with ambulation. Nurse aware.  Pt needing min to mod assist for mobility with pt needing incr assist with ambulation due to poor posture with pt flexing head and trunk quite a bit making progression of ambulation more difficult.  Pt fatigues as well.  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   Chart states that pt has a friend that may be able to assist.  If so, could go home with HHPT but if not, recommend post acute rehab < 3 hours day to gain strength and endurance and give time for healing. Pt currently with functional limitations due to the deficits listed below (see PT Problem List). Pt will benefit from acute skilled PT to increase their independence and safety with mobility to allow discharge.           If plan is discharge home, recommend the following: A little help with bathing/dressing/bathroom;Assistance with cooking/housework;Assist for transportation   Can travel by private vehicle   No    Equipment Recommendations None recommended by PT  Recommendations for Other Services       Functional Status Assessment Patient has had a recent decline in their functional status and demonstrates the ability to make significant improvements in function in a reasonable and predictable amount of time.     Precautions / Restrictions Precautions Precautions: Sternal Precaution Booklet Issued: No Recall of  Precautions/Restrictions: Impaired Precaution/Restrictions Comments: Educated and discussed sternal precautions but pt needed repetition. Restrictions Weight Bearing Restrictions Per Provider Order: No Other Position/Activity Restrictions: sternal precautions      Mobility  Bed Mobility Overal bed mobility: Needs Assistance Bed Mobility: Rolling, Sidelying to Sit Rolling: Min assist Sidelying to sit: Min assist       General bed mobility comments: Needed cues for sternal precautions and min assist to come to EOB.    Transfers Overall transfer level: Needs assistance Equipment used:  Winfred walker) Transfers: Sit to/from Stand Sit to Stand: Mod assist, From elevated surface           General transfer comment: Pt required verbal cues to put hands on knees to push up from knees. Pt needed mod assist to stand and balance with cues for sternal precautions throughout.    Ambulation/Gait Ambulation/Gait assistance: Mod assist, +2 safety/equipment Gait Distance (Feet): 80 Feet Assistive device: Elyn Finder Gait Pattern/deviations: Step-to pattern, Decreased step length - right, Decreased step length - left, Decreased stride length, Antalgic, Trunk flexed, Drifts right/left, Leaning posteriorly   Gait velocity interpretation: <1.31 ft/sec, indicative of household ambulator   General Gait Details: Pt needed mod assist for postural stability and progression of gait with Elyn walker. Pt flexes trunk and neck and needed constant cues to stand upright with pt unable to achieve full upright position which pt states was premorbid. Pt moves slowly and needed cues for sequencing steps and the eva walker. Constant cues for UE placement during transitions.  Nurse had to bring chair for pt to sit in as pt c/o  dizziness once in hallway and pt with BP drop.  See VS flowsheet.  Stairs            Wheelchair Mobility     Tilt Bed    Modified Rankin (Stroke Patients Only)       Balance  Overall balance assessment: Needs assistance Sitting-balance support: No upper extremity supported, Feet supported Sitting balance-Leahy Scale: Fair     Standing balance support: Bilateral upper extremity supported, During functional activity, Reliant on assistive device for balance Standing balance-Leahy Scale: Poor                               Pertinent Vitals/Pain Pain Assessment Pain Assessment: No/denies pain    Home Living Family/patient expects to be discharged to:: Private residence Living Arrangements: Other relatives Available Help at Discharge: Family;Available 24 hours/day;Personal care attendant (4 days/ week - 8 hours day for sister) Type of Home: House Home Access: Stairs to enter;Ramped entrance Entrance Stairs-Rails: Right Entrance Stairs-Number of Steps: 5   Home Layout: Two level;Laundry or work area in basement;Able to live on main level with bedroom/bathroom (PCA does laundry for pt) Home Equipment: Agricultural consultant (2 wheels);BSC/3in1;Tub bench;Cane - single point Additional Comments: Pt feeds sister and gives her bath when PCA not there    Prior Function Prior Level of Function : Independent/Modified Independent;Driving             Mobility Comments: No asssistive device PTA per pt ADLs Comments: No assist for B/D,     Extremity/Trunk Assessment   Upper Extremity Assessment Upper Extremity Assessment: Defer to OT evaluation    Lower Extremity Assessment Lower Extremity Assessment: Generalized weakness    Cervical / Trunk Assessment Cervical / Trunk Assessment: Kyphotic  Communication   Communication Communication: No apparent difficulties    Cognition Arousal: Alert Behavior During Therapy: WFL for tasks assessed/performed, Flat affect   PT - Cognitive impairments: Sequencing, Problem solving, Safety/Judgement                         Following commands: Impaired Following commands impaired: Follows one step  commands inconsistently, Follows one step commands with increased time     Cueing Cueing Techniques: Verbal cues, Tactile cues, Gestural cues     General Comments General comments (skin integrity, edema, etc.): 136/74 intiial; 110/73 sitting, 122/74 standing; 83/60 once seated and dizzy, 101/65 once back in room    Exercises     Assessment/Plan    PT Assessment Patient needs continued PT services  PT Problem List Decreased activity tolerance;Decreased balance;Decreased mobility;Decreased knowledge of use of DME;Decreased safety awareness;Decreased knowledge of precautions       PT Treatment Interventions DME instruction;Gait training;Functional mobility training;Therapeutic activities;Therapeutic exercise;Balance training;Patient/family education    PT Goals (Current goals can be found in the Care Plan section)  Acute Rehab PT Goals Patient Stated Goal: to go home PT Goal Formulation: With patient Time For Goal Achievement: 04/27/24 Potential to Achieve Goals: Good    Frequency Min 2X/week     Co-evaluation               AM-PAC PT 6 Clicks Mobility  Outcome Measure Help needed turning from your back to your side while in a flat bed without using bedrails?: A Little Help needed moving from lying on your back to sitting on the side of a flat bed without using bedrails?: A Little Help needed moving to  and from a bed to a chair (including a wheelchair)?: A Little Help needed standing up from a chair using your arms (e.g., wheelchair or bedside chair)?: A Little Help needed to walk in hospital room?: Total Help needed climbing 3-5 steps with a railing? : Total 6 Click Score: 14    End of Session Equipment Utilized During Treatment: Gait belt Activity Tolerance: Patient limited by fatigue (limited by dizziness and decr BP) Patient left: with call bell/phone within reach (on 3N1 with nurse aware) Nurse Communication: Mobility status;Other (comment) (pt left on 3N1) PT  Visit Diagnosis: Muscle weakness (generalized) (M62.81);Other abnormalities of gait and mobility (R26.89)    Time: 8976-8946 PT Time Calculation (min) (ACUTE ONLY): 30 min   Charges:   PT Evaluation $PT Eval Moderate Complexity: 1 Mod PT Treatments $Gait Training: 8-22 mins PT General Charges $$ ACUTE PT VISIT: 1 Visit         Broghan Pannone M,PT Acute Rehab Services [phone]   Stephane JULIANNA Bevel 04/13/2024, 11:55 AM

## 2024-04-14 ENCOUNTER — Inpatient Hospital Stay (HOSPITAL_COMMUNITY)

## 2024-04-14 LAB — BPAM RBC
Blood Product Expiration Date: 202507242359
Blood Product Expiration Date: 202507272359
Blood Product Expiration Date: 202507302359
Blood Product Expiration Date: 202508022359
Blood Product Expiration Date: 202508022359
Blood Product Expiration Date: 202508032359
Blood Product Expiration Date: 202508032359
Blood Product Unit Number: 202507242359
ISSUE DATE / TIME: 202507201030
ISSUE DATE / TIME: 202507201030
ISSUE DATE / TIME: 202507201030
PRODUCT CODE: 202507201030
PRODUCT CODE: 202507242359
Unit Type and Rh: 9500
Unit Type and Rh: 9500
Unit Type and Rh: 9500
Unit Type and Rh: 9500
Unit Type and Rh: 9500
Unit Type and Rh: 9500
Unit Type and Rh: 9500
Unit Type and Rh: 202507242359
Unit Type and Rh: 9500
Unit Type and Rh: 9500

## 2024-04-14 LAB — TYPE AND SCREEN
ABO/RH(D): O NEG
Antibody Screen: NEGATIVE
Unit division: 0
Unit division: 0
Unit division: 0
Unit division: 0
Unit division: 0
Unit division: 0
Unit division: 0

## 2024-04-14 LAB — BASIC METABOLIC PANEL WITH GFR
Anion gap: 8 (ref 5–15)
BUN: 24 mg/dL — ABNORMAL HIGH (ref 8–23)
CO2: 22 mmol/L (ref 22–32)
Calcium: 8.6 mg/dL — ABNORMAL LOW (ref 8.9–10.3)
Chloride: 103 mmol/L (ref 98–111)
Creatinine, Ser: 0.71 mg/dL (ref 0.61–1.24)
GFR, Estimated: >60 mL/min (ref >60)
Glucose, Bld: 102 mg/dL — ABNORMAL HIGH (ref 70–99)
Potassium: 3.7 mmol/L (ref 3.5–5.1)
Sodium: 133 mmol/L — ABNORMAL LOW (ref 135–145)

## 2024-04-14 LAB — CBC
HCT: 31.4 % — ABNORMAL LOW (ref 39.0–52.0)
Hemoglobin: 10.7 g/dL — ABNORMAL LOW (ref 13.0–17.0)
MCH: 32.4 pg (ref 26.0–34.0)
MCHC: 34.1 g/dL (ref 30.0–36.0)
MCV: 95.2 fL (ref 80.0–100.0)
Platelets: 170 K/uL (ref 150–400)
RBC: 3.3 MIL/uL — ABNORMAL LOW (ref 4.22–5.81)
RDW: 13.9 % (ref 11.5–15.5)
WBC: 8.3 K/uL (ref 4.0–10.5)
nRBC: 0 % (ref 0.0–0.2)

## 2024-04-14 MED ORDER — GABAPENTIN 100 MG PO CAPS
200.0000 mg | ORAL_CAPSULE | Freq: Every day | ORAL | Status: DC
  Administered 2024-04-15 – 2024-04-22 (×8): 200 mg via ORAL
  Filled 2024-04-14 (×8): qty 2

## 2024-04-14 MED ORDER — POTASSIUM CHLORIDE CRYS ER 20 MEQ PO TBCR
20.0000 meq | EXTENDED_RELEASE_TABLET | Freq: Two times a day (BID) | ORAL | Status: AC
  Administered 2024-04-14 (×2): 20 meq via ORAL
  Filled 2024-04-14 (×2): qty 1

## 2024-04-14 MED FILL — Mannitol IV Soln 20%: INTRAVENOUS | Qty: 500 | Status: AC

## 2024-04-14 MED FILL — Calcium Chloride Inj 10%: INTRAVENOUS | Qty: 10 | Status: AC

## 2024-04-14 MED FILL — Sodium Bicarbonate IV Soln 8.4%: INTRAVENOUS | Qty: 50 | Status: AC

## 2024-04-14 MED FILL — Electrolyte-R (PH 7.4) Solution: INTRAVENOUS | Qty: 3000 | Status: AC

## 2024-04-14 MED FILL — Heparin Sodium (Porcine) Inj 1000 Unit/ML: INTRAMUSCULAR | Qty: 10 | Status: AC

## 2024-04-14 MED FILL — Heparin Sodium (Porcine) Inj 1000 Unit/ML: INTRAMUSCULAR | Qty: 30 | Status: AC

## 2024-04-14 NOTE — Plan of Care (Signed)
   Problem: Health Behavior/Discharge Planning: Goal: Ability to manage health-related needs will improve Outcome: Progressing   Problem: Clinical Measurements: Goal: Will remain free from infection Outcome: Progressing   Problem: Activity: Goal: Risk for activity intolerance will decrease Outcome: Progressing

## 2024-04-14 NOTE — Care Management Important Message (Signed)
 Important Message  Patient Details  Name: Jesse [name] MRN: [id number] Date of Birth: 1940/11/28   Important Message Given:  Yes - Medicare IM     Claretta Deed 04/14/2024, 3:19 PM

## 2024-04-14 NOTE — Progress Notes (Addendum)
 Physical Therapy Treatment Patient Details Name: Jesse [name] MRN: [id number] DOB: 11-04-1940 Today's Date: 04/14/2024   History of Present Illness 83 year old male admitted 7/19  with acute NSTEMI.  Patient underwent cardiac catheterization which showed multivessel coronary artery disease, underwent CABG x 3 7/20. Episode symptomatic orthostatic hypotension during OT/PT sessions 7/23. PMH: hypertension, hyperlipidemia, polycythemia vera status post phlebectomy last time on 7/15    PT Comments  Pt received sitting EOB in care of OT, pt reporting fatigue but agreeable to therapy session with PTA with plan for instruction on transfer training. Pt unable to progress gait distance this session due to symptomatic orthostatic hypotension with standing 1-2 minutes at a time, with BP decrease from SBP 127 sitting EOB to SBP 93 standing, then SBP 138 sitting in chair after step pivot. Pt pale with clammy skin while reporting lightheadedness; RN also notified and chair alarm activated by charge RN at end of session as RN not available at time of session ending. MD notified pt may benefit from BLE compression socks if able given incisions to see if this improves standing tolerance/BP stability. Pt given extensive education on sternal precs but demonstrates poor recall and pt has limited insight into deficits, reporting he may consider post-acute rehab but had a bad experience and would not want to go back to facility he went to last time; pt states he will consider alternate locations possibly but seems unsure, will need reinforcement.   If plan is discharge home, recommend the following: A little help with bathing/dressing/bathroom;Assistance with cooking/housework;Assist for transportation;A lot of help with walking and/or transfers;Supervision due to cognitive status;Help with stairs or ramp for entrance   Can travel by private vehicle     No  Equipment Recommendations  None recommended by PT     Recommendations for Other Services       Precautions / Restrictions Precautions Precautions: Sternal Precaution Booklet Issued: Yes (comment) Recall of Precautions/Restrictions: Impaired Precaution/Restrictions Comments: Educated and discussed sternal precautions but pt needed repetition. Pt unable to report any of 4 precautions when PTA started session just as OT session ended. Restrictions Weight Bearing Restrictions Per Provider Order: No Other Position/Activity Restrictions: sternal precautions     Mobility  Bed Mobility Overal bed mobility: Needs Assistance Bed Mobility: Supine to Sit           General bed mobility comments: pt received sitting EOB in care of OT; had needed dense cues and minA to achieve EOB per OT report.    Transfers Overall transfer level: Needs assistance Equipment used: Rollator (4 wheels) Transfers: Sit to/from Stand, Bed to chair/wheelchair/BSC Sit to Stand: Min assist, Mod assist   Step pivot transfers: Mod assist       General transfer comment: From EOB light modA to stand with dense multimodal cues prior to and during transfer after pt scooted forward for better position. Pt took pivotal steps to chair from EOB and needed max safety cues to prevent him sitting on L arm rest impulsively/prior to reaching proximity to chair and needed reminder to look back to ensure he is in safe position prior to sitting    Ambulation/Gait               General Gait Details: defer due to symptomatic orthostatic hypotension with OT and then PT symptoms   Stairs             Wheelchair Mobility     Tilt Bed    Modified Rankin (Stroke Patients Only)  Balance Overall balance assessment: Needs assistance Sitting-balance support: Feet supported Sitting balance-Leahy Scale: Fair     Standing balance support: Bilateral upper extremity supported, No upper extremity supported Standing balance-Leahy Scale: Poor Standing balance  comment: impulsive to sit when lightheadedness/fatigue increased, needs RW and HHA or BUE support of AD to prevent LOB                            Communication Communication Communication: Impaired Factors Affecting Communication: Difficulty expressing self  Cognition Arousal: Alert Behavior During Therapy: Flat affect   PT - Cognitive impairments: Sequencing, Problem solving, Safety/Judgement, No family/caregiver present to determine baseline, Memory, Attention                       PT - Cognition Comments: Pt with poor carryover of sternal precautions with dovetail PT session immediately after OT session. PTA reinforced all 4 precautions and pt encouraged to review handout so he can remember them as he is still hoping to DC home post-acute. PTA reinforced with him that he is not yet modI and pt not yet able to take care of his sister, which he typically does ~3 days per week. Slow processing and decreased insight into his deficits this date. Following commands: Impaired Following commands impaired: Follows one step commands inconsistently    Cueing Cueing Techniques: Verbal cues, Tactile cues, Gestural cues  Exercises      General Comments General comments (skin integrity, edema, etc.): RN/MD notified of pt symptomatic orthostatic hypotension after session (RN not availabe initially so charge RN notified pt needs chair alarm box (green box and grey cord for secretary light connection) for his room, pt sitting on chair alarm pad already, charge RN agreeable to finish setting alarm up for him.      Pertinent Vitals/Pain Pain Assessment Pain Assessment: Faces Faces Pain Scale: Hurts a little bit Pain Descriptors / Indicators: Guarding Pain Intervention(s): Limited activity within patient's tolerance, Monitored during session, Repositioned    Home Living Family/patient expects to be discharged to:: Private residence Living Arrangements: Other relatives Available  Help at Discharge: Family;Available 24 hours/day;Personal care attendant Type of Home: House Home Access: Stairs to enter;Ramped entrance Entrance Stairs-Rails: Right Entrance Stairs-Number of Steps: 5   Home Layout: Two level;Laundry or work area in basement;Able to live on main level with bedroom/bathroom (PCA assists with laundry) Home Equipment: Agricultural consultant (2 wheels);BSC/3in1;Tub bench;Cane - single point Additional Comments: Pt feeds sister and gives her bath when PCA not there    Prior Function            PT Goals (current goals can now be found in the care plan section) Acute Rehab PT Goals PT Goal Formulation: With patient Time For Goal Achievement: 04/27/24 Progress towards PT goals: Progressing toward goals (slowly)    Frequency    Min 2X/week      PT Plan      Co-evaluation              AM-PAC PT 6 Clicks Mobility   Outcome Measure  Help needed turning from your back to your side while in a flat bed without using bedrails?: A Little Help needed moving from lying on your back to sitting on the side of a flat bed without using bedrails?: A Lot (mod cues) Help needed moving to and from a bed to a chair (including a wheelchair)?: A Lot (mod cues) Help needed standing up from  a chair using your arms (e.g., wheelchair or bedside chair)?: A Lot (mod cues and lift assist) Help needed to walk in hospital room?: Total Help needed climbing 3-5 steps with a railing? : Total 6 Click Score: 11    End of Session Equipment Utilized During Treatment: Gait belt Activity Tolerance: Patient limited by fatigue;Treatment limited secondary to medical complications (Comment);Other (comment) (symptomatic orthostatic hypotension) Patient left: in chair;with call bell/phone within reach;with nursing/sitter in room;with chair alarm set;Other (comment) (RN arriving to place his chair alarm as no box was in room when PTA worked with him; pt reclined and BLE elevated for comfort  and due to softer BP after transfer) Nurse Communication: Mobility status;Precautions;Other (comment) (sx orthostatic hypotension) PT Visit Diagnosis: Muscle weakness (generalized) (M62.81);Other abnormalities of gait and mobility (R26.89)     Time: 8851-8788 PT Time Calculation (min) (ACUTE ONLY): 23 min  Charges:    $Therapeutic Activity: 8-22 mins PT General Charges $$ ACUTE PT VISIT: 1 Visit                     Josue Kass P., PTA Acute Rehabilitation Services Secure Chat Preferred 9a-5:30pm Office: [phone]    Connell HERO [facility] 04/14/2024, 2:51 PM

## 2024-04-14 NOTE — Progress Notes (Addendum)
 [address] [employer] [place] [address]             [phone]      3 Days Post-Op Procedure(s) (LRB): CORONARY ARTERY BYPASS GRAFTING (CABG) X THREE, USING LEFT INTERNAL MAMMARY ARTERY AND RIGHT LEG GREATER SAPHENOUS VEIN HARVESTED ENDOSCOPICALLY (N/A) ECHOCARDIOGRAM, TRANSESOPHAGEAL, INTRAOPERATIVE  Subjective: Patient reports no pain shortness of breath or new concerns. Patient sitting up in bed. Jesse [name] states Jesse [name] is comfortable and recovering well. Expressed desire for home health over SNF placement upon discharge.   Objective: Vital signs in last 24 hours: Temp:  [97.6 F (36.4 C)-100.1 F (37.8 C)] 97.6 F (36.4 C) (07/23 0424) Pulse Rate:  [70-113] 80 (07/23 0424) Cardiac Rhythm: Sinus tachycardia (07/22 2125) Resp:  [15-26] 16 (07/23 0424) BP: (83-136)/(53-99) 102/58 (07/23 0424) SpO2:  [75 %-97 %] 96 % (07/23 0424) Weight:  [64.8 kg] 64.8 kg (07/23 0500)    Intake/Output from previous day: 07/22 0701 - 07/23 0700 In: 1218 [P.O.:1018; IV Piggyback:200] Out: 745 [Urine:675; Chest Tube:70] Intake/Output this shift: No intake/output data recorded.  General appearance: alert and no distress  Lab Results: Recent Labs    04/13/24 0406 04/14/24 0308  WBC 11.3* 8.3  HGB 10.8* 10.7*  HCT 31.3* 31.4*  PLT 148* 170   BMET:  Recent Labs    04/13/24 0406 04/14/24 0308  NA 129* 133*  K 3.6 3.7  CL 99 103  CO2 25 22  GLUCOSE 95 102*  BUN 15 24*  CREATININE 0.78 0.71  CALCIUM  8.8* 8.6*    Jesse [name]/INR:  Recent Labs    04/11/24 1553  LABPROT 25.2*  INR 2.2*   ABG    Component Value Date/Time   PHART 7.374 04/11/2024 2217   HCO3 19.9 (L) 04/11/2024 2217   TCO2 21 (L) 04/11/2024 2217   ACIDBASEDEF 5.0 (H) 04/11/2024 2217   O2SAT 98 04/11/2024 2217   CBG (last 3)  Recent Labs    04/12/24 2339 04/13/24 0405 04/13/24 0753  GLUCAP 110* 102* 111*    Assessment/Plan: S/P Procedure(s) (LRB): CORONARY ARTERY BYPASS GRAFTING (CABG) X  THREE, USING LEFT INTERNAL MAMMARY ARTERY AND RIGHT LEG GREATER SAPHENOUS VEIN HARVESTED ENDOSCOPICALLY (N/A) ECHOCARDIOGRAM, TRANSESOPHAGEAL, INTRAOPERATIVE  CV: Normal rate and rhythm. No murmurs or rubs noted.   Pulm: Minor pleural effusion noted on x-ray lower left lobe. Diminished breath sounds on the left. No rales or wheezing.   GI: Abdomen soft and non tender. Jesse [name] passing flautus. Bowel sounds present.   GU: Voiding adequately.   Renal: Stable. Creatinine .71. Continue to monitor electrolytes. Maintaining good urine output.   Plan: Continue telemetry monitoring. Encourage incentive spirometry and pulmonary hygiene. Continue ambulation as tolerated. Monitor fluid status and daily weights. Continue to monitor orthostatics.    LOS: 4 days    Jesse [name] 04/14/2024  Patient sitting up in bed w/o specific complaints. Jesse [name] is recommending SNF placement.  Jesse [name] is refusing and requesting home health with a rollator.  Jesse [name] has not yet moved his bowels, but is passing gas.  Gen: NAD Heart: RRR Lungs: diminished left base  Abd: soft non-tender, non-distended Ext: trace edema Incisions: C/D/I.Jesse [name] ecchymosis RLE  A/p:  CV- NSR, BP has been dropping with ambulation- on lopressor  12.5 mg BID, no antihypertensives at this time.Jesse [name] if persists will need to start Midodrine Pulm- wean oxygen as tolerated, small left pleural effusion, monitor Renal- creatinine has been stable K is improved.. will hold lasix  for now  in setting up orthostatic hypotension Deconditioning- Jesse [name] recs SNF, patient refuses will arrange home health Jesse [name]  Agree with above note by [name] PA-Student  Rocky Shad, PA-C 8:19 AM 04/14/24   Chart reviewed, patient examined, agree with above.  Jesse [name] was orthostatic in the ICU yesterday. Agree with stopping lasix  for now although Jesse [name] has small left effusion. Wt is at preop. Will hold Lopressor  today to and see how BP is tomorrow.

## 2024-04-14 NOTE — Evaluation (Signed)
 Occupational Therapy Evaluation Patient Details Name: Jesse [name] MRN: [id number] DOB: 23-Feb-1941 Today's Date: 04/14/2024   History of Present Illness   83 year old male admitted 7/19  with acute NSTEMI.  Patient underwent cardiac catheterization which showed multivessel coronary artery disease, underwent CABG x 3 7/20. PMH: hypertension, hyperlipidemia, polycythemia vera status post phlebectomy last time on 7/15     Clinical Impressions Pt reported at PLOF they did not use any DME but they were the caregiver to their sister when PCA was not their to assist. Per pt the PCA comes to assist sister with showers and IADLS as needed 4 x a week. At this time pt could not recall sternal precautions and reviewed with written handout at the beginning of the session and could not recall by the end of session. Pt completed bed mobility with min assist and sit to stand transfers with min assist. PT however when standing became dizzy and noted to stop talking (BP bellow). Pt then completed bathing with min assist for UE and max assist with LE. At this time recommendation for SNF but declining so next best recommendation for [facility] with max services.   BP: Supine: 122/79 (89) Sitting: 117/60 (77) Standing: 127/78    If plan is discharge home, recommend the following:   A little help with walking and/or transfers;A little help with bathing/dressing/bathroom;Assistance with cooking/housework;Direct supervision/assist for medications management;Direct supervision/assist for financial management;Assist for transportation;Help with stairs or ramp for entrance;Supervision due to cognitive status     Functional Status Assessment   Patient has had a recent decline in their functional status and demonstrates the ability to make significant improvements in function in a reasonable and predictable amount of time.     Equipment Recommendations    [id number])     Recommendations for Other Services          Precautions/Restrictions   Precautions Precautions: Sternal Precaution Booklet Issued: Yes (comment) Recall of Precautions/Restrictions: Impaired Precaution/Restrictions Comments: Educated and discussed sternal precautions but pt needed repetition. Pt could not recall any precautions from the start of session. Restrictions Weight Bearing Restrictions Per Provider Order: No RUE Weight Bearing Per Provider Order: Non weight bearing LUE Weight Bearing Per Provider Order: Non weight bearing Other Position/Activity Restrictions: sternal precautions     Mobility Bed Mobility Overal bed mobility: Needs Assistance Bed Mobility: Supine to Sit Rolling: Min assist Sidelying to sit: Min assist Supine to sit: Min assist     General bed mobility comments: pt needed max cues for sternal precautions    Transfers Overall transfer level: Needs assistance Equipment used: Rollator (4 wheels) Transfers: Sit to/from Stand Sit to Stand: Min assist                  Balance Overall balance assessment: Needs assistance Sitting-balance support: Feet supported Sitting balance-Leahy Scale: Fair     Standing balance support: Bilateral upper extremity supported, No upper extremity supported Standing balance-Leahy Scale: Poor                             ADL either performed or assessed with clinical judgement   ADL Overall ADL's : Needs assistance/impaired Eating/Feeding: Independent;Sitting   Grooming: Wash/dry face;Set up;Sitting   Upper Body Bathing: Minimal assistance;Sitting   Lower Body Bathing: Maximal assistance   Upper Body Dressing : Minimal assistance;Sitting   Lower Body Dressing: Maximal assistance;Sit to/from stand Lower Body Dressing Details (indicate cue type and reason): attempted to don socks  with AE Toilet Transfer: Minimal assistance Toilet Transfer Details (indicate cue type and reason): needed rocking movements                 Vision          Perception         Praxis         Pertinent Vitals/Pain Pain Assessment Pain Assessment: 0-10 Pain Score: 2  Facial Expression: Relaxed, neutral Body Movements: Absence of movements Muscle Tension: Relaxed Compliance with ventilator (intubated pts.): N/A Vocalization (extubated pts.): N/A CPOT Total: 0 Pain Location: sx site Pain Descriptors / Indicators: Aching Pain Intervention(s): Limited activity within patient's tolerance, Monitored during session     Extremity/Trunk Assessment Upper Extremity Assessment Upper Extremity Assessment: Generalized weakness (due to sternal precautions limited assessment)       Cervical / Trunk Assessment Cervical / Trunk Assessment: Kyphotic   Communication Communication Communication: No apparent difficulties   Cognition Arousal: Alert Behavior During Therapy: WFL for tasks assessed/performed, Flat affect Cognition: No apparent impairments                               Following commands: Impaired       Cueing  General Comments   Cueing Techniques: Verbal cues;Tactile cues;Gestural cues      Exercises     Shoulder Instructions      Home Living Family/patient expects to be discharged to:: Private residence Living Arrangements: Other relatives Available Help at Discharge: Family;Available 24 hours/day;Personal care attendant Type of Home: House Home Access: Stairs to enter;Ramped entrance Entrance Stairs-Number of Steps: 5 Entrance Stairs-Rails: Right Home Layout: Two level;Laundry or work area in basement;Able to live on main level with bedroom/bathroom (PCA assists with laundry)     Bathroom Shower/Tub: Tub/shower unit;Curtain   Bathroom Toilet: Standard     Home Equipment: Agricultural consultant (2 wheels);BSC/3in1;Tub bench;Cane - single point   Additional Comments: Pt feeds sister and gives her bath when PCA not there      Prior Functioning/Environment Prior Level of Function :  Independent/Modified Independent;Driving             Mobility Comments: No asssistive device PTA per pt ADLs Comments: No assist for B/D,    OT Problem List: Decreased strength;Decreased range of motion;Impaired balance (sitting and/or standing);Decreased activity tolerance;Decreased safety awareness;Decreased knowledge of use of DME or AE;Pain   OT Treatment/Interventions: Self-care/ADL training;Therapeutic exercise;DME and/or AE instruction;Therapeutic activities;Patient/family education;Balance training      OT Goals(Current goals can be found in the care plan section)   Acute Rehab OT Goals Patient Stated Goal: to go home OT Goal Formulation: With patient Time For Goal Achievement: 04/29/24 Potential to Achieve Goals: Good   OT Frequency:  Min 2X/week    Co-evaluation              AM-PAC OT 6 Clicks Daily Activity     Outcome Measure Help from another person eating meals?: None Help from another person taking care of personal grooming?: A Little Help from another person toileting, which includes using toliet, bedpan, or urinal?: A Little Help from another person bathing (including washing, rinsing, drying)?: A Lot Help from another person to put on and taking off regular upper body clothing?: A Little Help from another person to put on and taking off regular lower body clothing?: A Lot 6 Click Score: 17   End of Session Equipment Utilized During Treatment: Rollator (4 wheels) Nurse Communication:  Mobility status;Other (comment) (rehab needs)  Activity Tolerance: Other (comment) (due to feeling dizzy) Patient left: in bed;Other (comment) (transitioned to PT)  OT Visit Diagnosis: Unsteadiness on feet (R26.81);Other abnormalities of gait and mobility (R26.89);Muscle weakness (generalized) (M62.81);Pain Pain - part of body:  (sternal)                Time: 8887-8845 OT Time Calculation (min): 42 min Charges:  OT General Charges $OT Visit: 1 Visit OT  Evaluation $OT Eval Moderate Complexity: 1 Mod OT Treatments $Self Care/Home Management : 23-37 mins  Warrick POUR OTR/L  Acute Rehab Services  [phone] office number   Warrick Berber 04/14/2024, 12:07 PM

## 2024-04-15 NOTE — Progress Notes (Signed)
 Mobility Specialist Progress Note:   04/15/24 0926  Mobility  Activity Transferred from bed to chair  Level of Assistance Minimal assist, patient does 75% or more  Assistive Device Front wheel walker  Distance Ambulated (ft) 5 ft  RUE Weight Bearing Per Provider Order NWB  LUE Weight Bearing Per Provider Order NWB  Activity Response Tolerated well  Mobility Referral Yes  Mobility visit 1 Mobility  Mobility Specialist Start Time (ACUTE ONLY) [id number]  Mobility Specialist Stop Time (ACUTE ONLY) 0933  Mobility Specialist Time Calculation (min) (ACUTE ONLY) 7 min   Pt received in bed, agreeable to mobility session. Transferred B>C via MinA and RW. Tolerated well, asx throughout. Sitting up in chair with all needs met, call bell in reach, chair alarm on.   Jesse [name] Mobility Specialist Please contact via Special educational needs teacher or  Rehab office at [phone]

## 2024-04-15 NOTE — TOC Progression Note (Signed)
 Transition of Care [facility]) - Progression Note    Patient Details  Name: Jesse [name] MRN: [id number] Date of Birth: Jan 07, 1941  Transition of Care [facility]) CM/SW Contact  Montie LOISE Louder, [place] Phone Number: 04/15/2024, 12:37 PM  Clinical Narrative:     PA advised patient has repeatedly  declined SNF.  TOC will continue to follow and assist with discharge needs once patient is stable for d/c home.   Montie Louder, MSW, LCSW Clinical Social Worker    Expected Discharge Plan: Home w Home Health Services Barriers to Discharge: Continued Medical Work up               Expected Discharge Plan and Services   Discharge Planning Services: CM Consult Post Acute Care Choice: Home Health Living arrangements for the past 2 months: Single Family Home                                       Social Drivers of Health (SDOH) Interventions SDOH Screenings   Food Insecurity: No Food Insecurity (04/10/2024)  Housing: Low Risk  (04/10/2024)  Transportation Needs: No Transportation Needs (04/10/2024)  Utilities: Not At Risk (04/10/2024)  Depression (PHQ2-9): Low Risk  (04/06/2024)  Social Connections: Socially Isolated (04/11/2024)  Tobacco Use: Low Risk  (04/11/2024)    Readmission Risk Interventions     No data to display

## 2024-04-15 NOTE — Progress Notes (Addendum)
 4 Days Post-Op Procedure(s) (LRB): CORONARY ARTERY BYPASS GRAFTING (CABG) X THREE, USING LEFT INTERNAL MAMMARY ARTERY AND RIGHT LEG GREATER SAPHENOUS VEIN HARVESTED ENDOSCOPICALLY (N/A) ECHOCARDIOGRAM, TRANSESOPHAGEAL, INTRAOPERATIVE Subjective: Patient sitting up in bed. Pt denies chest pain, shortness of breath, palpitations. No nausea or vomiting. Pt states he slept intermittently. Pt continues to refuse SNF placement in favor of home health.   Objective: Vital signs in last 24 hours: Temp:  [97.9 F (36.6 C)-98.9 F (37.2 C)] 98.9 F (37.2 C) (07/24 0326) Pulse Rate:  [82-102] 102 (07/24 0326) Cardiac Rhythm: Normal sinus rhythm (07/23 1900) Resp:  [17-20] 20 (07/24 0326) BP: (104-131)/(65-84) 105/81 (07/24 0326) SpO2:  [96 %-100 %] 100 % (07/24 0326) Weight:  [65.5 kg] 65.5 kg (07/24 0326)    Intake/Output from previous day: 07/23 0701 - 07/24 0700 In: -  Out: 800 [Urine:800] Intake/Output this shift: No intake/output data recorded.  General appearance: alert and no distress Neurologic: intact Heart: regular rate and rhythm Lungs: diminished breath sounds LLL Abdomen: soft, non-tender; bowel sounds normal; no masses,  no organomegaly Extremities: extremities normal, atraumatic, no cyanosis or edema Wound: Incisions clean, dry and intact.   Lab Results: Recent Labs    04/13/24 0406 04/14/24 0308  WBC 11.3* 8.3  HGB 10.8* 10.7*  HCT 31.3* 31.4*  PLT 148* 170   BMET:  Recent Labs    04/13/24 0406 04/14/24 0308  NA 129* 133*  K 3.6 3.7  CL 99 103  CO2 25 22  GLUCOSE 95 102*  BUN 15 24*  CREATININE 0.78 0.71  CALCIUM  8.8* 8.6*    PT/INR: No results for input(s): LABPROT, INR in the last 72 hours. ABG    Component Value Date/Time   PHART 7.374 04/11/2024 2217   HCO3 19.9 (L) 04/11/2024 2217   TCO2 21 (L) 04/11/2024 2217   ACIDBASEDEF 5.0 (H) 04/11/2024 2217   O2SAT 98 04/11/2024 2217   CBG (last 3)  Recent Labs    04/12/24 2339 04/13/24 0405  04/13/24 0753  GLUCAP 110* 102* 111*    Assessment/Plan: S/P Procedure(s) (LRB): CORONARY ARTERY BYPASS GRAFTING (CABG) X THREE, USING LEFT INTERNAL MAMMARY ARTERY AND RIGHT LEG GREATER SAPHENOUS VEIN HARVESTED ENDOSCOPICALLY (N/A) ECHOCARDIOGRAM, TRANSESOPHAGEAL, INTRAOPERATIVE  Gen: No acute distress CV: Regular rate and rhythm.  Pulm: Diminished breath sounds lll d/t mild effusion. GI: Soft non tender abdomen, non distended. Pt passing flautus. Extremities: No edema present. Ambulates with assistance. Mild ecchymosis RLE.  Incisions clean dry and intact.   Continue telemetry monitoring. Encourage incentive spirometry and pulmonary hygiene. Continue ambulation as tolerated. Monitor fluid status and daily weights. Continue to monitor orthostatics.     LOS: 5 days    Robert Plagmann 04/15/2024  Agree with Above... Patient continues to refuse SNF placement.  Home health arrangements have been made.  He is tachycardic which we will monitor.  Not currently on BB due to orthostasis.  Patient will need to be able to get up and ambulate without assistance prior to being ready for d/c  Rocky Shad, PA-C 9:20 AM 04/15/24   Chart reviewed, patient examined, agree with above.  He only ambulated to chair this am with mobility team. He will need to be ambulating well before going home.

## 2024-04-15 NOTE — Plan of Care (Signed)

## 2024-04-16 ENCOUNTER — Inpatient Hospital Stay (HOSPITAL_COMMUNITY)

## 2024-04-16 MED ORDER — ENSURE PLUS HIGH PROTEIN PO LIQD
237.0000 mL | Freq: Two times a day (BID) | ORAL | Status: DC
  Administered 2024-04-16 – 2024-04-18 (×3): 237 mL via ORAL

## 2024-04-16 NOTE — Progress Notes (Signed)
 Occupational Therapy Treatment Patient Details Name: Jesse [name] MRN: [id number] DOB: 1940/12/31 Today's Date: 04/16/2024   History of present illness 83 year old male admitted 7/19  with acute NSTEMI.  Patient underwent cardiac catheterization which showed multivessel coronary artery disease, underwent CABG x 3 7/20. Episode symptomatic orthostatic hypotension during OT/PT sessions 7/23. PMH: hypertension, hyperlipidemia, polycythemia vera status post phlebectomy last time on 7/15   OT comments   Pt progressing well towards goals. Today's session focused on functional implementation of sternal precautions. Pt with poor recall of sternal precautions both verbally and with examples, pt unable to state if movement is safe or not. OT demonstrated compensatory techniques for ADLs within precautions. Pt reporting verbal understanding, but during functional practice, pt unable to perform tasks without step by step cues. Noted improvements with performance but still requires constant supervision d/t poor recall and judgement. Continue to recommend <3 hours of skilled rehab daily to optimize independence levels. Will continue to follow acutely.       If plan is discharge home, recommend the following:  A little help with walking and/or transfers;A little help with bathing/dressing/bathroom;Assistance with cooking/housework;Direct supervision/assist for medications management;Direct supervision/assist for financial management;Assist for transportation;Help with stairs or ramp for entrance;Supervision due to cognitive status   Equipment Recommendations  None recommended by OT    Recommendations for Other Services      Precautions / Restrictions Precautions Precautions: Sternal Precaution Booklet Issued: Yes (comment) Recall of Precautions/Restrictions: Impaired Precaution/Restrictions Comments: Poor recall and understanding of precautions Restrictions Weight Bearing Restrictions Per Provider  Order: No Other Position/Activity Restrictions: sternal precautions       Mobility Bed Mobility   General bed mobility comments: pt received in recliner    Transfers Overall transfer level: Needs assistance Equipment used: Rolling walker (2 wheels) Transfers: Sit to/from Stand Sit to Stand: Contact guard assist           General transfer comment: Cues for use of momentum, x3 standing trials all at [facility] for balance     Balance Overall balance assessment: Needs assistance Sitting-balance support: Feet supported Sitting balance-Leahy Scale: Fair     Standing balance support: Bilateral upper extremity supported, No upper extremity supported Standing balance-Leahy Scale: Poor Standing balance comment: Benefits from UE support     ADL either performed or assessed with clinical judgement   ADL Overall ADL's : Needs assistance/impaired     Upper Body Dressing : Supervision/safety;Cueing for safety;Cueing for UE precautions;Sitting Upper Body Dressing Details (indicate cue type and reason): Heavy cueing for sequencing Lower Body Dressing: Contact guard assist;Sit to/from stand;Cueing for compensatory techniques;Cueing for sequencing;Cueing for safety Lower Body Dressing Details (indicate cue type and reason): Difficulty orienting clothing, requiring step by step cues to maintain precautions     Toileting- Clothing Manipulation and Hygiene: Contact guard assist;Sit to/from stand;Cueing for safety;Cueing for compensatory techniques Toileting - Clothing Manipulation Details (indicate cue type and reason): Cueing to adhere to sternal precautions     Functional mobility during ADLs: Contact guard assist;Rolling walker (2 wheels) General ADL Comments: Pt with difficulty implementing compensatory techniques for ADLs,despite demos    Extremity/Trunk Assessment Upper Extremity Assessment Upper Extremity Assessment: Generalized weakness   Lower Extremity Assessment Lower  Extremity Assessment: Defer to PT evaluation        Vision   Vision Assessment?: No apparent visual deficits         Communication Communication Communication: Impaired Factors Affecting Communication: Difficulty expressing self   Cognition Arousal: Alert Behavior During Therapy: Flat affect Cognition:  Cognition impaired     Awareness: Intellectual awareness intact, Online awareness impaired Memory impairment (select all impairments): Short-term memory Attention impairment (select first level of impairment): Sustained attention Executive functioning impairment (select all impairments): Sequencing, Problem solving, Organization OT - Cognition Comments: Difficulty recalling precautions, OT demo'ing techniques and pt unable to implement them during functional practice   Following commands: Impaired Following commands impaired: Follows one step commands inconsistently      Cueing   Cueing Techniques: Verbal cues, Tactile cues, Gestural cues        General Comments Pt destating on RA to 86, cues to recover with pursed lip breathing, poor implementation    Pertinent Vitals/ Pain       Pain Assessment Pain Assessment: Faces Pain Location: incision site Pain Descriptors / Indicators: Guarding Pain Intervention(s): Monitored during session   Frequency  Min 2X/week        Progress Toward Goals  OT Goals(current goals can now be found in the care plan section)  Progress towards OT goals: Progressing toward goals  Acute Rehab OT Goals Patient Stated Goal: To go home OT Goal Formulation: With patient Time For Goal Achievement: 04/29/24 Potential to Achieve Goals: Good ADL Goals Pt Will Perform Grooming: with modified independence;sitting Pt Will Perform Upper Body Bathing: with modified independence;sitting Pt Will Perform Lower Body Bathing: with contact guard assist;sit to/from stand Pt Will Perform Upper Body Dressing: with modified independence;sitting Pt Will  Perform Lower Body Dressing: with contact guard assist;with adaptive equipment;sit to/from stand Pt Will Transfer to Toilet: with modified independence;ambulating  Plan         AM-PAC OT 6 Clicks Daily Activity     Outcome Measure   Help from another person eating meals?: None Help from another person taking care of personal grooming?: A Little Help from another person toileting, which includes using toliet, bedpan, or urinal?: A Little Help from another person bathing (including washing, rinsing, drying)?: A Little Help from another person to put on and taking off regular upper body clothing?: A Little Help from another person to put on and taking off regular lower body clothing?: A Little 6 Click Score: 19    End of Session Equipment Utilized During Treatment: Gait belt;Rolling walker (2 wheels)  OT Visit Diagnosis: Unsteadiness on feet (R26.81);Other abnormalities of gait and mobility (R26.89);Muscle weakness (generalized) (M62.81);Pain   Activity Tolerance Patient tolerated treatment well   Patient Left in chair;with call bell/phone within reach;with chair alarm set;with nursing/sitter in room   Nurse Communication Mobility status        Time: 8871-8855 OT Time Calculation (min): 16 min  Charges: OT General Charges $OT Visit: 1 Visit OT Treatments $Self Care/Home Management : 8-22 mins  Adrianne BROCKS, OT  Acute Rehabilitation Services Office [phone] Secure chat preferred   Adrianne GORMAN Savers 04/16/2024, 11:58 AM

## 2024-04-16 NOTE — Progress Notes (Signed)
 CARDIAC REHAB PHASE I   Pt still feeling weak. Ed given to pt alone. Discussed heart healthy diet, sternal precautions, IS use, wound care, and exercise guidelines. Unsure how receptive pt is to ed. He seemed to be possibly falling asleep. Pt left in the bed w/ call bell in reach. Mobility team came to walk him after I left.   [phone] Johnnie JINNY Moats, MS, ACSM-CEP 04/16/2024 10:18 AM

## 2024-04-16 NOTE — Progress Notes (Addendum)
 5 Days Post-Op Procedure(s) (LRB): CORONARY ARTERY BYPASS GRAFTING (CABG) X THREE, USING LEFT INTERNAL MAMMARY ARTERY AND RIGHT LEG GREATER SAPHENOUS VEIN HARVESTED ENDOSCOPICALLY (N/A) ECHOCARDIOGRAM, TRANSESOPHAGEAL, INTRAOPERATIVE Subjective: Patient sitting up in bed. Pt reports he was too tired to walk yesterday during mobility specialist visit. Pt is still experiencing some fatigue and dizziness with ambulation. Reports pain is well controlled. Denies chest pain, or shortness of breath. Pt reports he had a bad dream and experienced some palpitations overnight.   Objective: Vital signs in last 24 hours: Temp:  [97.5 F (36.4 C)-98.6 F (37 C)] 98.5 F (36.9 C) (07/25 0527) Pulse Rate:  [81-121] 96 (07/25 0558) Cardiac Rhythm: Normal sinus rhythm (07/24 1953) Resp:  [14-29] 14 (07/25 0558) BP: (115-138)/(65-114) 126/86 (07/25 0534) SpO2:  [77 %-100 %] 99 % (07/25 0558) Weight:  [64.8 kg] 64.8 kg (07/25 0500)   Intake/Output from previous day: 07/24 0701 - 07/25 0700 In: 250 [P.O.:250] Out: 1050 [Urine:1050] Intake/Output this shift: No intake/output data recorded.  General appearance: alert and cooperative no acute distress.  Heart: Regular rate and rhythm.  Lungs: Diminished breath sounds lower left base.  Abdomen: Soft, non tender, non distended.  Extremities: No cyanosis or edema noted.  Wound: Incisions clean dry and intact.   Lab Results: Recent Labs    04/14/24 0308  WBC 8.3  HGB 10.7*  HCT 31.4*  PLT 170   BMET:  Recent Labs    04/14/24 0308  NA 133*  K 3.7  CL 103  CO2 22  GLUCOSE 102*  BUN 24*  CREATININE 0.71  CALCIUM  8.6*    PT/INR: No results for input(s): LABPROT, INR in the last 72 hours. ABG    Component Value Date/Time   PHART 7.374 04/11/2024 2217   HCO3 19.9 (L) 04/11/2024 2217   TCO2 21 (L) 04/11/2024 2217   ACIDBASEDEF 5.0 (H) 04/11/2024 2217   O2SAT 98 04/11/2024 2217   CBG (last 3)  No results for input(s): GLUCAP in  the last 72 hours.  Assessment/Plan: S/P Procedure(s) (LRB): CORONARY ARTERY BYPASS GRAFTING (CABG) X THREE, USING LEFT INTERNAL MAMMARY ARTERY AND RIGHT LEG GREATER SAPHENOUS VEIN HARVESTED ENDOSCOPICALLY (N/A) ECHOCARDIOGRAM, TRANSESOPHAGEAL, INTRAOPERATIVE   CV: Regular rate and rhythm. Telemetry has show some runs of tachycardia along with intervals of irregularity. Continue to monitor closely.   Pulm: Diminished breath sounds left. Improved from yesterday. Right side clear.   GI: Good bowel sounds. Pt passing stool without pain.   Plan: Patient has has runs of tachycardia with hr in 120s since d/c of lopressor . Continue telemetry monitoring. Continue to encourage incentive spirometry. Encourage ambulation/mobility. Patient will need to make ambulation progress today prior to discharge. Continue to monitor fluid status, daily weights and orthostatics.     LOS: 6 days    Jesse Plagmann, PA-S 04/16/2024  Patient did not ambulate yesterday due to being tired.  He also continues to have some dizziness.  I explained to the patient the importance of ambulation and with current physical state he is unsafe for discharge home.  He has moved his bowels.  Gen: NAD Heart: RRR Lungs: diminished on left Abd: soft non-tender non-distended Ext: trace Incisions: C/D/I   A/P:  CV- Sinus Tach, + PACs at time- unable to have Lopressor  at this time due to dizziness/orthostasis Pulm- left pleural effusion, will get cxr to re-assess... continue IS Renal- creatinine stable, weight stable no lasix  at this time in setting of orthostasis Deconditioning- severe, patient is not ambulating.. recommending SNF placement.SABRASABRA  patient is refusing.. not currently safe for discharge home  Agree with above documentation by PA-Student Jesse [name]  Jesse Barrett, PA-C 8:19 AM 04/16/24   Chart reviewed, patient examined, agree with above.  He is having a slow course as expected at 28 with frailty. His CXR  looks ok with mild left base atelectasis, possibly tiny effusion, otherwise clear. He is still mildly orthostatic.

## 2024-04-16 NOTE — Progress Notes (Signed)
 PT Cancellation Note  Patient Details Name: Jesse [name] MRN: [id number] DOB: [birth year]   Cancelled Treatment:     Other - PTA attempt AM, pt working with mobility specialist, then with OT. Will continue efforts next date per PT plan of care as schedule permits.   Connell HERO Lyda Colcord 04/16/2024, 4:24 PM

## 2024-04-16 NOTE — Progress Notes (Signed)
 Plan of care is reviewed. Pt has been progressing. He is alert and fully oriented x 4, stable hemodynamically, NSR on the monitor, afebrile, on room air, normal respiratory effort, no acute distress.  Mid sternal incision and right leg are dry and clean, negative drainage. Pain is well tolerated. Pt has slept well with no major complaints. Only feeling slightly dizziness when he gets up and ambulates 6-10 feet. At that time his HR is 120-130, BP remains stable. We will try to ambulate with the mobility specialist again at am.   Wendi Dash, RN

## 2024-04-16 NOTE — Progress Notes (Signed)
 Mobility Specialist Progress Note:    04/16/24 1011  Orthostatic Lying   BP- Lying 122/78 (82)  Pulse- Lying 94  Orthostatic Sitting  BP- Sitting 110/77 (89)  Pulse- Sitting 112  Orthostatic Standing at 0 minutes  BP- Standing at 0 minutes 99/69 (77)  Pulse- Standing at 0 minutes 129  Orthostatic Standing at 3 minutes  BP- Standing at 3 minutes 116/82 (93)  Pulse- Standing at 3 minutes 124  Mobility  Activity Ambulated with assistance in room;Ambulated with assistance in hallway;Transferred from bed to chair  Level of Assistance Minimal assist, patient does 75% or more  Assistive Device Front wheel walker  Distance Ambulated (ft) 100 ft  RUE Weight Bearing Per Provider Order NWB  LUE Weight Bearing Per Provider Order NWB  Activity Response Tolerated well  Mobility Referral Yes  Mobility visit 1 Mobility  Mobility Specialist Start Time (ACUTE ONLY) 1011  Mobility Specialist Stop Time (ACUTE ONLY) 1031  Mobility Specialist Time Calculation (min) (ACUTE ONLY) 20 min    Pt received in bed, agreeable to mobility. Recorded orthostatics during session. See above. Chair follow for safety. MinA to sit EOB, MinG to stand and ambulate with RW. Tolerated session well, returned pt to room, sitting up in chair with all needs met. Final BP 116/88 (98), SpO2 94% on RA.  [place]on Mobility Specialist Please contact via Special educational needs teacher or  Rehab office at [phone]

## 2024-04-17 LAB — BASIC METABOLIC PANEL WITH GFR
Anion gap: 7 (ref 5–15)
BUN: 17 mg/dL (ref 8–23)
CO2: 24 mmol/L (ref 22–32)
Calcium: 8.9 mg/dL (ref 8.9–10.3)
Chloride: 102 mmol/L (ref 98–111)
Creatinine, Ser: 0.59 mg/dL — ABNORMAL LOW (ref 0.61–1.24)
GFR, Estimated: >60 mL/min (ref >60)
Glucose, Bld: 86 mg/dL (ref 70–99)
Potassium: 3.3 mmol/L — ABNORMAL LOW (ref 3.5–5.1)
Sodium: 133 mmol/L — ABNORMAL LOW (ref 135–145)

## 2024-04-17 MED ORDER — POTASSIUM CHLORIDE CRYS ER 20 MEQ PO TBCR
40.0000 meq | EXTENDED_RELEASE_TABLET | Freq: Two times a day (BID) | ORAL | Status: AC
  Administered 2024-04-17 (×2): 40 meq via ORAL
  Filled 2024-04-17 (×2): qty 2

## 2024-04-17 NOTE — Progress Notes (Addendum)
 Physical Therapy Treatment Patient Details Name: Jesse [name] MRN: [id number] DOB: [birth year] Today's Date: 04/17/2024   History of Present Illness 83 year old male admitted 7/19  with acute NSTEMI.  Patient underwent cardiac catheterization which showed multivessel coronary artery disease, underwent CABG x 3 7/20. Episode symptomatic orthostatic hypotension during OT/PT sessions 7/23. PMH: hypertension, hyperlipidemia, polycythemia vera status post phlebectomy last time on 7/15    PT Comments  Pt making steady progress with mobility. Continues to need cues for sternal precautions as he does not functionally follow them otherwise. Liked the rollator better for amb. Mild initial dizziness with positional changes but resolved and didn't interfere with mobility. Patient will benefit from continued inpatient follow up therapy, <3 hours/day but if he refuses this would maximize HH.  Orthostatic BPs  Supine 123/90   Sitting 131/75 HR 103  Standing after 3 min 108/68 HR 122  Sitting after amb 131/89 HR 113      If plan is discharge home, recommend the following: A little help with walking and/or transfers;A little help with bathing/dressing/bathroom;Assistance with cooking/housework;Assist for transportation   Can travel by private vehicle     Yes  Equipment Recommendations  Rollator (4 wheels)    Recommendations for Other Services       Precautions / Restrictions Precautions Precautions: Sternal;Fall Recall of Precautions/Restrictions: Impaired Precaution/Restrictions Comments: Poor recall of precautions Restrictions Other Position/Activity Restrictions: sternal precautions     Mobility  Bed Mobility Overal bed mobility: Needs Assistance Bed Mobility: Rolling, Sidelying to Sit Rolling: Contact guard assist Sidelying to sit: Contact guard assist, HOB elevated       General bed mobility comments: Verbal cues for technique and to follow sternal precautions.     Transfers Overall transfer level: Needs assistance Equipment used: Rollator (4 wheels) Transfers: Sit to/from Stand Sit to Stand: Min assist, Contact guard assist           General transfer comment: Initially min assist to power up from bed. Verbal cues for hands to knees. With repetition pt able to improve to CGA.    Ambulation/Gait Ambulation/Gait assistance: Contact guard assist Gait Distance (Feet): 150 Feet Assistive device: Rollator (4 wheels) Gait Pattern/deviations: Step-through pattern, Decreased stride length, Trunk flexed Gait velocity: decr Gait velocity interpretation: 1.31 - 2.62 ft/sec, indicative of limited community ambulator   General Gait Details: Assist for safety.   Stairs             Wheelchair Mobility     Tilt Bed    Modified Rankin (Stroke Patients Only)       Balance Overall balance assessment: Needs assistance Sitting-balance support: No upper extremity supported, Feet supported Sitting balance-Leahy Scale: Good     Standing balance support: Single extremity supported, Bilateral upper extremity supported, During functional activity Standing balance-Leahy Scale: Poor Standing balance comment: UE support                            Communication Communication Communication: No apparent difficulties  Cognition Arousal: Alert Behavior During Therapy: Flat affect   PT - Cognitive impairments: Memory                       PT - Cognition Comments: Difficulty following sternal precautions Following commands: Impaired Following commands impaired: Only follows one step commands consistently    Cueing Cueing Techniques: Verbal cues, Tactile cues, Gestural cues  Exercises      General Comments General comments (skin integrity,  edema, etc.): Orthostatic BP's checked: supine 123/90, sitting 131/75, standing 108/68, sitting after amb 131/89. Pt reported mild dizziness initially with positional changes and  tolerated activity without difficulty.      Pertinent Vitals/Pain Pain Assessment Pain Assessment: No/denies pain    Home Living                          Prior Function            PT Goals (current goals can now be found in the care plan section) Acute Rehab PT Goals Patient Stated Goal: to go home Progress towards PT goals: Progressing toward goals    Frequency    Min 3X/week      PT Plan      Co-evaluation              AM-PAC PT 6 Clicks Mobility   Outcome Measure  Help needed turning from your back to your side while in a flat bed without using bedrails?: A Little Help needed moving from lying on your back to sitting on the side of a flat bed without using bedrails?: A Little Help needed moving to and from a bed to a chair (including a wheelchair)?: A Little Help needed standing up from a chair using your arms (e.g., wheelchair or bedside chair)?: A Little Help needed to walk in hospital room?: A Little Help needed climbing 3-5 steps with a railing? : A Lot 6 Click Score: 17    End of Session Equipment Utilized During Treatment: Gait belt Activity Tolerance: Patient tolerated treatment well Patient left: in chair;with call bell/phone within reach;with chair alarm set Nurse Communication: Mobility status PT Visit Diagnosis: Muscle weakness (generalized) (M62.81);Other abnormalities of gait and mobility (R26.89)     Time: 9061-8997 PT Time Calculation (min) (ACUTE ONLY): 24 min  Charges:    $Gait Training: 23-37 mins PT General Charges $$ ACUTE PT VISIT: 1 Visit                     [facility] PT Acute Rehabilitation Services Office [phone]    [place] ORN [facility] 04/17/2024, 11:05 AM

## 2024-04-17 NOTE — Progress Notes (Addendum)
                  [address]           Thurmon BROCKS [place], [place] [address]                     [phone]        6 Days Post-Op Procedure(s) (LRB): CORONARY ARTERY BYPASS GRAFTING (CABG) X THREE, USING LEFT INTERNAL MAMMARY ARTERY AND RIGHT LEG GREATER SAPHENOUS VEIN HARVESTED ENDOSCOPICALLY (N/A) ECHOCARDIOGRAM, TRANSESOPHAGEAL, INTRAOPERATIVE  Subjective: Patient eating breakfast this am. He has no specific complaint. When asked if had dizziness with standing or ambulating yesterday, he said no.  Objective: Vital signs in last 24 hours: Temp:  [97.6 F (36.4 C)-98.6 F (37 C)] 98 F (36.7 C) (07/26 0451) Pulse Rate:  [90-102] 100 (07/26 0600) Cardiac Rhythm: Normal sinus rhythm (07/26 0345) Resp:  [15-20] 18 (07/26 0451) BP: (102-135)/(60-83) 123/78 (07/26 0451) SpO2:  [91 %-100 %] 98 % (07/26 0451) Weight:  [64 kg] 64 kg (07/26 0500)  Pre op weight 64.5 kg Current Weight  04/17/24 64 kg      Intake/Output from previous day: 07/25 0701 - 07/26 0700 In: 350 [P.O.:350] Out: 2100 [Urine:2100]   Physical Exam:  Cardiovascular: Slightly tachycardic Pulmonary: Clear to auscultation on right and slightly diminished left base Abdomen: Soft, non tender, bowel sounds present. Extremities: No lower extremity edema. Wounds: Clean and dry.  No erythema or signs of infection.  Lab Results: CBC:No results for input(s): WBC, HGB, HCT, PLT in the last 72 hours. BMET:  Recent Labs    04/17/24 0315  NA 133*  K 3.3*  CL 102  CO2 24  GLUCOSE 86  BUN 17  CREATININE 0.59*  CALCIUM  8.9    PT/INR:  Lab Results  Component Value Date   INR 2.2 (H) 04/11/2024   ABG:  INR: Will add last result for INR, ABG once components are confirmed Will add last 4 CBG results once components are confirmed  Assessment/Plan:  1. CV - S/p NSTEMI. ST at times with HR in the low 100's. Not on BB secondary to mild ortho stasis 2.  Pulmonary - On room air. Chest x ray  done yesterday showed small left pleural effusion/atelectasis.Encourage incentive spirometer. 3.  Expected post op acute blood loss anemia - Last H and H stable at 10.7 and 31.4 4. Mild hyponatremia-sodium this am remains 133 5. Hypokalemia-potassium 3.3. Supplement 6. Deconditioned-continue PT/OT. Patient refuses SNF. He will need improvement in ambulation prior to discharging home.  Donielle M ZimmermanPA-C 7:21 AM   Chart reviewed, patient examined, agree with above.  He says he walked down the hall with walker this am. Will give him a few more days to get stronger and continue ambulation and hopefully get home early in the week.

## 2024-04-18 NOTE — Plan of Care (Signed)
  Problem: Education: Goal: Knowledge of General Education information will improve Description: Including pain rating scale, medication(s)/side effects and non-pharmacologic comfort measures Outcome: Not Progressing   Problem: Health Behavior/Discharge Planning: Goal: Ability to manage health-related needs will improve Outcome: Not Progressing   Problem: Clinical Measurements: Goal: Ability to maintain clinical measurements within normal limits will improve Outcome: Not Progressing Goal: Will remain free from infection Outcome: Not Progressing Goal: Diagnostic test results will improve Outcome: Not Progressing Goal: Respiratory complications will improve Outcome: Not Progressing Goal: Cardiovascular complication will be avoided Outcome: Not Progressing   Problem: Activity: Goal: Risk for activity intolerance will decrease Outcome: Not Progressing   Problem: Nutrition: Goal: Adequate nutrition will be maintained Outcome: Not Progressing   Problem: Coping: Goal: Level of anxiety will decrease Outcome: Not Progressing   Problem: Elimination: Goal: Will not experience complications related to bowel motility Outcome: Not Progressing Goal: Will not experience complications related to urinary retention Outcome: Not Progressing   Problem: Pain Managment: Goal: General experience of comfort will improve and/or be controlled Outcome: Not Progressing   Problem: Safety: Goal: Ability to remain free from injury will improve Outcome: Not Progressing   Problem: Skin Integrity: Goal: Risk for impaired skin integrity will decrease Outcome: Not Progressing   Problem: Education: Goal: Understanding of CV disease, CV risk reduction, and recovery process will improve Outcome: Not Progressing Goal: Individualized Educational Video(s) Outcome: Not Progressing   Problem: Activity: Goal: Ability to return to baseline activity level will improve Outcome: Not Progressing   Problem:  Cardiovascular: Goal: Ability to achieve and maintain adequate cardiovascular perfusion will improve Outcome: Not Progressing Goal: Vascular access site(s) Level 0-1 will be maintained Outcome: Not Progressing   Problem: Health Behavior/Discharge Planning: Goal: Ability to safely manage health-related needs after discharge will improve Outcome: Not Progressing   Problem: Education: Goal: Will demonstrate proper wound care and an understanding of methods to prevent future damage Outcome: Not Progressing Goal: Knowledge of disease or condition will improve Outcome: Not Progressing Goal: Knowledge of the prescribed therapeutic regimen will improve Outcome: Not Progressing Goal: Individualized Educational Video(s) Outcome: Not Progressing   Problem: Activity: Goal: Risk for activity intolerance will decrease Outcome: Not Progressing   Problem: Cardiac: Goal: Will achieve and/or maintain hemodynamic stability Outcome: Not Progressing   Problem: Clinical Measurements: Goal: Postoperative complications will be avoided or minimized Outcome: Not Progressing   Problem: Respiratory: Goal: Respiratory status will improve Outcome: Not Progressing   Problem: Skin Integrity: Goal: Wound healing without signs and symptoms of infection Outcome: Not Progressing Goal: Risk for impaired skin integrity will decrease Outcome: Not Progressing   Problem: Urinary Elimination: Goal: Ability to achieve and maintain adequate renal perfusion and functioning will improve Outcome: Not Progressing

## 2024-04-18 NOTE — Plan of Care (Signed)
  Problem: Education: Goal: Knowledge of General Education information will improve Description: Including pain rating scale, medication(s)/side effects and non-pharmacologic comfort measures Outcome: Progressing   Problem: Health Behavior/Discharge Planning: Goal: Ability to manage health-related needs will improve Outcome: Progressing   Problem: Clinical Measurements: Goal: Ability to maintain clinical measurements within normal limits will improve Outcome: Progressing Goal: Will remain free from infection Outcome: Progressing Goal: Diagnostic test results will improve Outcome: Progressing Goal: Respiratory complications will improve Outcome: Progressing Goal: Cardiovascular complication will be avoided Outcome: Progressing   Problem: Activity: Goal: Risk for activity intolerance will decrease Outcome: Progressing   Problem: Nutrition: Goal: Adequate nutrition will be maintained Outcome: Progressing   Problem: Coping: Goal: Level of anxiety will decrease Outcome: Progressing   Problem: Elimination: Goal: Will not experience complications related to bowel motility Outcome: Progressing Goal: Will not experience complications related to urinary retention Outcome: Progressing   Problem: Pain Managment: Goal: General experience of comfort will improve and/or be controlled Outcome: Progressing   Problem: Safety: Goal: Ability to remain free from injury will improve Outcome: Progressing   Problem: Skin Integrity: Goal: Risk for impaired skin integrity will decrease Outcome: Progressing   Problem: Education: Goal: Understanding of CV disease, CV risk reduction, and recovery process will improve Outcome: Progressing Goal: Individualized Educational Video(s) Outcome: Progressing   Problem: Activity: Goal: Ability to return to baseline activity level will improve Outcome: Progressing   Problem: Cardiovascular: Goal: Ability to achieve and maintain adequate  cardiovascular perfusion will improve Outcome: Progressing Goal: Vascular access site(s) Level 0-1 will be maintained Outcome: Progressing   Problem: Health Behavior/Discharge Planning: Goal: Ability to safely manage health-related needs after discharge will improve Outcome: Progressing   Problem: Education: Goal: Will demonstrate proper wound care and an understanding of methods to prevent future damage Outcome: Progressing Goal: Knowledge of disease or condition will improve Outcome: Progressing Goal: Knowledge of the prescribed therapeutic regimen will improve Outcome: Progressing Goal: Individualized Educational Video(s) Outcome: Progressing   Problem: Activity: Goal: Risk for activity intolerance will decrease Outcome: Progressing   Problem: Cardiac: Goal: Will achieve and/or maintain hemodynamic stability Outcome: Progressing   Problem: Clinical Measurements: Goal: Postoperative complications will be avoided or minimized Outcome: Progressing   Problem: Respiratory: Goal: Respiratory status will improve Outcome: Progressing   Problem: Skin Integrity: Goal: Wound healing without signs and symptoms of infection Outcome: Progressing Goal: Risk for impaired skin integrity will decrease Outcome: Progressing   Problem: Urinary Elimination: Goal: Ability to achieve and maintain adequate renal perfusion and functioning will improve Outcome: Progressing

## 2024-04-18 NOTE — Progress Notes (Addendum)
                  [address]           Thurmon BROCKS [place], [place] [address]                     [phone]        7 Days Post-Op Procedure(s) (LRB): CORONARY ARTERY BYPASS GRAFTING (CABG) X THREE, USING LEFT INTERNAL MAMMARY ARTERY AND RIGHT LEG GREATER SAPHENOUS VEIN HARVESTED ENDOSCOPICALLY (N/A) ECHOCARDIOGRAM, TRANSESOPHAGEAL, INTRAOPERATIVE  Subjective: Patient sitting in chair, without complaints. He walked yesterday and did not have dizziness.  Objective: Vital signs in last 24 hours: Temp:  [97.5 F (36.4 C)-98.6 F (37 C)] 98.6 F (37 C) (07/27 0510) Pulse Rate:  [99-108] 99 (07/26 1600) Cardiac Rhythm: Sinus tachycardia (07/26 1943) Resp:  [15-20] 15 (07/27 0510) BP: (115-161)/(64-89) 120/64 (07/27 0510) SpO2:  [96 %-99 %] 99 % (07/27 0510) Weight:  [64.3 kg] 64.3 kg (07/27 0242)  Pre op weight 64.5 kg Current Weight  04/18/24 64.3 kg      Intake/Output from previous day: 07/26 0701 - 07/27 0700 In: -  Out: 850 [Urine:850]   Physical Exam:  Cardiovascular: Slightly tachycardic Pulmonary: Clear to auscultation on right and slightly diminished left base Abdomen: Soft, non tender, bowel sounds present. Extremities: No lower extremity edema. Wounds: Clean and dry.  No erythema or signs of infection.  Lab Results: CBC:No results for input(s): WBC, HGB, HCT, PLT in the last 72 hours. BMET:  Recent Labs    04/17/24 0315  NA 133*  K 3.3*  CL 102  CO2 24  GLUCOSE 86  BUN 17  CREATININE 0.59*  CALCIUM  8.9    PT/INR:  Lab Results  Component Value Date   INR 2.2 (H) 04/11/2024   ABG:  INR: Will add last result for INR, ABG once components are confirmed Will add last 4 CBG results once components are confirmed  Assessment/Plan:  1. CV - S/p NSTEMI. ST at times with HR in the low 100's. Not on BB secondary to mild ortho stasis. BP slowly improving 2.  Pulmonary - On room air. Encourage incentive spirometer. 3.  Expected post  op acute blood loss anemia - Last H and H stable at 10.7 and 31.4 4. Deconditioned-continue PT/OT. Patient refuses SNF. He will need improvement in ambulation prior to discharging home;hopefully, 1-2 more days  Donielle M ZimmermanPA-C 7:39 AM   Chart reviewed, patient examined, agree with above.  Walking better. Hopefully home in the next day or two.

## 2024-04-19 ENCOUNTER — Other Ambulatory Visit: Payer: Self-pay

## 2024-04-19 LAB — BASIC METABOLIC PANEL WITH GFR
Anion gap: 9 (ref 5–15)
BUN: 10 mg/dL (ref 8–23)
CO2: 20 mmol/L — ABNORMAL LOW (ref 22–32)
Calcium: 8.9 mg/dL (ref 8.9–10.3)
Chloride: 101 mmol/L (ref 98–111)
Creatinine, Ser: 0.56 mg/dL — ABNORMAL LOW (ref 0.61–1.24)
GFR, Estimated: >60 mL/min (ref >60)
Glucose, Bld: 89 mg/dL (ref 70–99)
Potassium: 3.5 mmol/L (ref 3.5–5.1)
Sodium: 130 mmol/L — ABNORMAL LOW (ref 135–145)

## 2024-04-19 MED ORDER — POTASSIUM CHLORIDE CRYS ER 20 MEQ PO TBCR
40.0000 meq | EXTENDED_RELEASE_TABLET | Freq: Once | ORAL | Status: AC
  Administered 2024-04-19: 40 meq via ORAL
  Filled 2024-04-19: qty 2

## 2024-04-19 MED ORDER — MIDODRINE HCL 5 MG PO TABS
10.0000 mg | ORAL_TABLET | Freq: Three times a day (TID) | ORAL | Status: DC
  Administered 2024-04-19 – 2024-04-23 (×11): 10 mg via ORAL
  Filled 2024-04-19 (×11): qty 2

## 2024-04-19 NOTE — Progress Notes (Addendum)
 [address] [place] [address]             [phone]      8 Days Post-Op Procedure(s) (LRB): CORONARY ARTERY BYPASS GRAFTING (CABG) X THREE, USING LEFT INTERNAL MAMMARY ARTERY AND RIGHT LEG GREATER SAPHENOUS VEIN HARVESTED ENDOSCOPICALLY (N/A) ECHOCARDIOGRAM, TRANSESOPHAGEAL, INTRAOPERATIVE Subjective: Patient reports he did not have any lightheadedness or dizziness yesterday but he also didn't walk yesterday because no one came to walk him.   Objective: Vital signs in last 24 hours: Temp:  [97.6 F (36.4 C)-98.5 F (36.9 C)] 98.3 F (36.8 C) (07/28 0433) Pulse Rate:  [86-112] 86 (07/28 0433) Cardiac Rhythm: Sinus tachycardia (07/28 0030) Resp:  [17-20] 17 (07/28 0431) BP: (96-130)/(72-88) 96/80 (07/28 0433) SpO2:  [98 %] 98 % (07/28 0433) Weight:  [60.1 kg] 60.1 kg (07/28 0431)  Hemodynamic parameters for last 24 hours:    Intake/Output from previous day: 07/27 0701 - 07/28 0700 In: 840 [P.O.:840] Out: 325 [Urine:325] Intake/Output this shift: No intake/output data recorded.  General appearance: alert, cooperative, and no distress Neurologic: intact Heart: sinus tachycardia, soft early systolic murmur Lungs: clear to auscultation bilaterally Abdomen: soft, non-tender; bowel sounds normal; no masses,  no organomegaly Extremities: extremities normal, atraumatic, no cyanosis or edema Wound: Clean and dry without sign of infection  Lab Results: No results for input(s): WBC, HGB, HCT, PLT in the last 72 hours. BMET:  Recent Labs    04/17/24 0315 04/19/24 0340  NA 133* 130*  K 3.3* 3.5  CL 102 101  CO2 24 20*  GLUCOSE 86 89  BUN 17 10  CREATININE 0.59* 0.56*  CALCIUM  8.9 8.9    PT/INR: No results for input(s): LABPROT, INR in the last 72 hours. ABG    Component Value Date/Time   PHART 7.374 04/11/2024 2217   HCO3 19.9 (L) 04/11/2024 2217   TCO2 21 (L) 04/11/2024 2217   ACIDBASEDEF 5.0 (H) 04/11/2024 2217    O2SAT 98 04/11/2024 2217   CBG (last 3)  No results for input(s): GLUCAP in the last 72 hours.  Assessment/Plan: S/P Procedure(s) (LRB): CORONARY ARTERY BYPASS GRAFTING (CABG) X THREE, USING LEFT INTERNAL MAMMARY ARTERY AND RIGHT LEG GREATER SAPHENOUS VEIN HARVESTED ENDOSCOPICALLY (N/A) ECHOCARDIOGRAM, TRANSESOPHAGEAL, INTRAOPERATIVE  CV: S/P NSTEMI, risks outweigh benefits of Plavix. Continue ASA 325mg  daily. SBP 96 this AM but up to 130 yesterday. Holding BB due to hypotension and orthostasis. NSR-ST this AM, HR 90s-115. On tele looks like patient may be having bursts of atrial fibrillation vs PACs, HR up to 130s. Will get EKG. BP restrictive of adding BB this AM.  Pulm: Saturating well on RA. Last CXR 07/25 with left basilar atelectasis and small pleural effusion. Encourage IS and ambulation  GI: +BM yesterday, tolerating diet.   Renal: Cr 0.56, stable. Mild hyponatremia, Na down to 130 this AM. Closely monitor. K 3.5, supplement. UO 325cc recorded. -10lbs from preop weight this AM, -9lbs from yesterday. Not sure if correct as he is not on diuresis due to orthostasis. Continue Flomax .   Expected postop ABLA: Last H/H 10.7/31.4, stable. Not clinically significant at this time.   DVT Prophylaxis: Lovenox   Deconditioning: SNF recommended but patient refuses. Plan to discharge with home health. Continue work with PT/OT. Reports he only ambulated 23ft yesterday. Will need to ambulate more before discharge.   Dispo: Get EKG this AM and continue to ambulate. Hopefully can d/c home midweek if he does not develop  afib and is ambulating better.    LOS: 9 days    Con GORMAN [place], [place] 04/19/2024   Agree Titrating meds Continue PT, OT Dispo planning  Linnie [name], MD

## 2024-04-19 NOTE — Progress Notes (Signed)
 Occupational Therapy Treatment Patient Details Name: Jesse [name] MRN: [id number] DOB: 31-May-1941 Today's Date: 04/19/2024   History of present illness 83 year old male admitted 7/19  with acute NSTEMI.  Patient underwent cardiac catheterization which showed multivessel coronary artery disease, underwent CABG x 3 7/20. Episode symptomatic orthostatic hypotension during OT/PT sessions 7/23. PMH: hypertension, hyperlipidemia, polycythemia vera status post phlebectomy last time on 7/15   OT comments  Pt presented in bed and agreeable to session. Pt reported sternal precautions at the start of session but needed max cues throughout to follow with tasks. He wanted to use the restroom but was limited due to Bp as seen bellow and reported  I am not dizzy but I feel floppy. Pt also noted to become SOB when attempting to ambulate to the bathroom in session. He completed BM but then asked to have total assist for peri care but when ask how are you going to do this home he was not sure. It was voiced to pt again about rehab stay but declining. Pt again if still declines to go to SNF then recommendation to keep caregiver for sister and max [facility] services.   BP: Supine: 114/75 (86) hr 109 Sitting: 120/69 86) hr 116 Attempting to ambulate to bathroom: 79/60 (66) hr 130 Post sitting with BLE elevated: 127/72 (88) hr 132 Post bm: 93/56 (68) Post sitting with BLE elevated: 113/60 (78)      If plan is discharge home, recommend the following:  A little help with walking and/or transfers;A little help with bathing/dressing/bathroom;Assistance with cooking/housework;Direct supervision/assist for medications management;Direct supervision/assist for financial management;Assist for transportation;Help with stairs or ramp for entrance;Supervision due to cognitive status   Equipment Recommendations  None recommended by OT    Recommendations for Other Services      Precautions / Restrictions  Precautions Precautions: Sternal;Fall Precaution Booklet Issued: Yes (comment) Recall of Precautions/Restrictions: Impaired Precaution/Restrictions Comments: Poor recall of precautions Restrictions Weight Bearing Restrictions Per Provider Order: No RUE Weight Bearing Per Provider Order: Non weight bearing LUE Weight Bearing Per Provider Order: Non weight bearing Other Position/Activity Restrictions: sternal precautions       Mobility Bed Mobility Overal bed mobility: Needs Assistance Bed Mobility: Supine to Sit Rolling: Contact guard assist Sidelying to sit: Contact guard assist Supine to sit: Contact guard          Transfers Overall transfer level: Needs assistance Equipment used: Rolling walker (2 wheels) Transfers: Sit to/from Stand Sit to Stand: Min assist           General transfer comment: most sit to stand transfers needs min assist even with rocking movements     Balance Overall balance assessment: Needs assistance Sitting-balance support: No upper extremity supported, Feet supported Sitting balance-Leahy Scale: Good     Standing balance support: Bilateral upper extremity supported Standing balance-Leahy Scale: Fair Standing balance comment: UE support                           ADL either performed or assessed with clinical judgement   ADL Overall ADL's : Needs assistance/impaired Eating/Feeding: Independent;Sitting   Grooming: Wash/dry face;Set up;Sitting   Upper Body Bathing: Contact guard assist;Sitting   Lower Body Bathing: Minimal assistance;Sitting/lateral leans;Sit to/from stand   Upper Body Dressing : Contact guard assist;Sitting   Lower Body Dressing: Contact guard assist;Sitting/lateral leans;Sit to/from stand   Toilet Transfer: Minimal assistance   Toileting- Clothing Manipulation and Hygiene: Total assistance;Sit to/from stand  Functional mobility during ADLs: Contact guard assist;Rolling walker (2 wheels)       Extremity/Trunk Assessment Upper Extremity Assessment Upper Extremity Assessment: Generalized weakness            Vision   Vision Assessment?: No apparent visual deficits   Perception     Praxis     Communication Communication Communication: No apparent difficulties Factors Affecting Communication: Difficulty expressing self   Cognition Arousal: Alert Behavior During Therapy: Flat affect Cognition: Cognition impaired     Awareness: Intellectual awareness intact, Online awareness impaired Memory impairment (select all impairments): Short-term memory Attention impairment (select first level of impairment): Divided attention Executive functioning impairment (select all impairments): Sequencing, Problem solving, Organization OT - Cognition Comments: Pt now reporting about precautions but when actually doing the tasks does no follow throug                 Following commands: Impaired Following commands impaired: Only follows one step commands consistently      Cueing   Cueing Techniques: Verbal cues, Tactile cues, Gestural cues  Exercises      Shoulder Instructions       General Comments      Pertinent Vitals/ Pain       Pain Assessment Pain Assessment: 0-10 Pain Score: 1  Facial Expression: Relaxed, neutral Body Movements: Absence of movements Muscle Tension: Relaxed Compliance with ventilator (intubated pts.): N/A Vocalization (extubated pts.): N/A CPOT Total: 0 Pain Location: incision site Pain Descriptors / Indicators: Guarding Pain Intervention(s): Limited activity within patient's tolerance, Monitored during session, Repositioned  Home Living                                          Prior Functioning/Environment              Frequency  Min 2X/week        Progress Toward Goals  OT Goals(current goals can now be found in the care plan section)  Progress towards OT goals: Progressing toward goals  Acute Rehab  OT Goals Patient Stated Goal: to get home OT Goal Formulation: With patient Time For Goal Achievement: 04/29/24 Potential to Achieve Goals: Good ADL Goals Pt Will Perform Grooming: with modified independence;sitting Pt Will Perform Upper Body Bathing: with modified independence;sitting Pt Will Perform Lower Body Bathing: with contact guard assist;sit to/from stand Pt Will Perform Upper Body Dressing: with modified independence;sitting Pt Will Perform Lower Body Dressing: with contact guard assist;with adaptive equipment;sit to/from stand Pt Will Transfer to Toilet: with modified independence;ambulating  Plan      Co-evaluation                 AM-PAC OT 6 Clicks Daily Activity     Outcome Measure   Help from another person eating meals?: None Help from another person taking care of personal grooming?: A Little Help from another person toileting, which includes using toliet, bedpan, or urinal?: A Little Help from another person bathing (including washing, rinsing, drying)?: A Little Help from another person to put on and taking off regular upper body clothing?: A Little Help from another person to put on and taking off regular lower body clothing?: A Little 6 Click Score: 19    End of Session Equipment Utilized During Treatment: Gait belt;Rolling walker (2 wheels)  OT Visit Diagnosis: Unsteadiness on feet (R26.81);Other abnormalities of gait and mobility (R26.89);Muscle weakness (generalized) (M62.81);Pain Pain -  part of body:  (sx site)   Activity Tolerance Other (comment) (bp)   Patient Left in chair;with call bell/phone within reach;with chair alarm set   Nurse Communication Mobility status        Time: 8954-8868 OT Time Calculation (min): 46 min  Charges: OT General Charges $OT Visit: 1 Visit OT Treatments $Self Care/Home Management : 38-52 mins  Jesse POUR OTR/L  Acute Rehab Services  [phone] office number   Jesse [name] 04/19/2024, 12:14 PM

## 2024-04-19 NOTE — Progress Notes (Signed)
 Notified by CCMD of his HR non sustained in 13s-140s (1148), patient was sitting in the chair, denied any complains, HR was in 110s during assessing the patient, PA made aware.

## 2024-04-19 NOTE — Progress Notes (Signed)
 Mobility Specialist Progress Note;   04/19/24 1234  Mobility  Activity Transferred from chair to bed  Level of Assistance Contact guard assist, steadying assist  Assistive Device None  Distance Ambulated (ft) 2 ft  RUE Weight Bearing Per Provider Order NWB  LUE Weight Bearing Per Provider Order NWB  Activity Response Tolerated well  Mobility Referral Yes  Mobility visit 1 Mobility  Mobility Specialist Start Time (ACUTE ONLY) 1234  Mobility Specialist Stop Time (ACUTE ONLY) 1240  Mobility Specialist Time Calculation (min) (ACUTE ONLY) 6 min   Answered pts call light requesting assistance back to bed. Required MinG assistance to safely stand and pivot back to bed. No c/o when asked. Pt left comfortably in bed with all needs met, call bell in reach. NT present.   Lauraine Erm Mobility Specialist Please contact via SecureChat or [employer] [phone]

## 2024-04-19 NOTE — Progress Notes (Signed)
 Physical Therapy Treatment Patient Details Name: Jesse [name] MRN: [id number] DOB: 1940-10-17 Today's Date: 04/19/2024   History of Present Illness 83 year old male admitted 7/19  with acute NSTEMI.  Patient underwent cardiac catheterization which showed multivessel coronary artery disease, underwent CABG x 3 7/20. Episode symptomatic orthostatic hypotension during OT/PT sessions 7/23. PMH: hypertension, hyperlipidemia, polycythemia vera status post phlebectomy last time on 7/15    PT Comments  Pt received in supine, agreeable to therapy session, with good participation and improving tolerance for transfer and gait training. Pt still with decreased carryover from instruction in previous sessions including AM OT session, pt able to recall ~2/4 sternal precautions with hints and will need consistent assist/supervision at home to ensure pt compliance with precautions. Pt with difficulty with cognitive challenge with dual tasking during hallway mobility. Pt performed bed mobility from nearly flat bed with up to heavy minA and multimodal cues for precautions, CGA for transfers and gait with sequencing/safety cues and distance limited due to pt c/o evolving fatigue. PTA reinforced with pt he may need to pay hired caregiver for more days to ensure initial 24/7 supervision/assist at home for him and his sister as he isn't able to help with household chores/lifting things at home as he was prior on days when caregiver not present, pt seemed somewhat surprised by this but also agreeable. Patient will benefit from continued inpatient follow up therapy, <3 hours/day but progressing toward home and if he is able to set up constant supervision/assist initially at home may be able to consider HHPT if BP remains stable next 1-2 sessions. Patient Position (if appropriate) Orthostatic Vitals  Orthostatic Lying   BP- Lying 113/72  Pulse- Lying 95  Orthostatic Sitting  BP- Sitting 115/77  Pulse- Sitting 108   Orthostatic Standing at 0 minutes  BP- Standing at 0 minutes 102/75  Pulse- Standing at 0 minutes 124       If plan is discharge home, recommend the following: A little help with walking and/or transfers;A little help with bathing/dressing/bathroom;Assistance with cooking/housework;Assist for transportation   Can travel by private vehicle     Yes  Equipment Recommendations  Rollator (4 wheels)    Recommendations for Other Services       Precautions / Restrictions Precautions Precautions: Sternal;Fall Precaution Booklet Issued: Yes (comment) Recall of Precautions/Restrictions: Impaired Precaution/Restrictions Comments: recalls 1/4 precs when prompted Restrictions Weight Bearing Restrictions Per Provider Order: No Other Position/Activity Restrictions: sternal precautions     Mobility  Bed Mobility Overal bed mobility: Needs Assistance Bed Mobility: Rolling, Sidelying to Sit, Sit to Sidelying Rolling: Contact guard assist Sidelying to sit: Min assist     Sit to sidelying: Min assist General bed mobility comments: from flat bed w/o rails, pt needing consistent minA and multimodal cues to prevent improper elbow winging/to maintain sternal precs.    Transfers Overall transfer level: Needs assistance Equipment used: Rolling walker (2 wheels) Transfers: Sit to/from Stand Sit to Stand: Contact guard assist           General transfer comment: from EOB<>RW, mod sequencing cues and did better after anterior scooting prior to trial. Pt needed tactile cues to remind him to push from his knees and tuck in his elbows.    Ambulation/Gait Ambulation/Gait assistance: Contact guard assist Gait Distance (Feet): 110 Feet Assistive device: Rolling walker (2 wheels) Gait Pattern/deviations: Step-through pattern, Decreased stride length, Trunk flexed Gait velocity: decr Gait velocity interpretation: <1.31 ft/sec, indicative of household ambulator   General Gait Details: pt c/o  BLE weakness/fatigue so returned to room after ~9ft in hallway. VSS on RA this date.   Stairs             Wheelchair Mobility     Tilt Bed    Modified Rankin (Stroke Patients Only)       Balance Overall balance assessment: Needs assistance Sitting-balance support: No upper extremity supported, Feet supported Sitting balance-Leahy Scale: Good     Standing balance support: Bilateral upper extremity supported, During functional activity, Reliant on assistive device for balance Standing balance-Leahy Scale: Poor Standing balance comment: reliant on RW                            Communication Communication Communication: No apparent difficulties  Cognition Arousal: Alert Behavior During Therapy: Flat affect   PT - Cognitive impairments: No family/caregiver present to determine baseline, Memory, Sequencing, Safety/Judgement                       PT - Cognition Comments: Difficulty following sternal precautions, when asked how are you supposed to wipe yourself after toileting? pt able to recall 1/4, and pt states don't reach up but not able to recall move in the tube precs, pt will need reinforcement. Pt had difficulty with higher level cognitive tasks such as counting backward by 2's or 3's from various numbers, when instructed to count back by 2's from 10 and given example 10, 8 pt able to continue sequence, but not able to count back by 3's during functional task and prior to this pt had been counting back by 1's when instructed to count back by 2's or 3's, even with example provided. Pt without c/o dizziness but does c/o BLE weakness, although BP appears WFL with increased movement. HR elevated to ~130 bpm wtih exertion. Following commands: Impaired Following commands impaired: Only follows one step commands consistently    Cueing Cueing Techniques: Verbal cues, Tactile cues, Gestural cues  Exercises      General Comments General comments  (skin integrity, edema, etc.): see BP in comments above; mild symptoms of weakness but much improved from earlier OT session.      Pertinent Vitals/Pain Pain Assessment Pain Assessment: Faces Faces Pain Scale: Hurts a little bit Pain Location: incision site Pain Descriptors / Indicators: Guarding, Grimacing, Sore Pain Intervention(s): Monitored during session, Repositioned    Home Living                          Prior Function            PT Goals (current goals can now be found in the care plan section) Acute Rehab PT Goals Patient Stated Goal: to go home PT Goal Formulation: With patient Time For Goal Achievement: 04/27/24 Progress towards PT goals: Progressing toward goals    Frequency    Min 3X/week      PT Plan      Co-evaluation              AM-PAC PT 6 Clicks Mobility   Outcome Measure  Help needed turning from your back to your side while in a flat bed without using bedrails?: A Little Help needed moving from lying on your back to sitting on the side of a flat bed without using bedrails?: A Lot (mod cues/without rail) Help needed moving to and from a bed to a chair (including a wheelchair)?: A Little Help  needed standing up from a chair using your arms (e.g., wheelchair or bedside chair)?: A Little Help needed to walk in hospital room?: A Little Help needed climbing 3-5 steps with a railing? : Total 6 Click Score: 15    End of Session Equipment Utilized During Treatment: Gait belt Activity Tolerance: Patient tolerated treatment well Patient left: in bed;with call bell/phone within reach;with bed alarm set Nurse Communication: Mobility status PT Visit Diagnosis: Muscle weakness (generalized) (M62.81);Other abnormalities of gait and mobility (R26.89)     Time: 8358-8298 PT Time Calculation (min) (ACUTE ONLY): 20 min  Charges:    $Gait Training: 8-22 mins PT General Charges $$ ACUTE PT VISIT: 1 Visit                     Yona Kosek  P., PTA Acute Rehabilitation Services Secure Chat Preferred 9a-5:30pm Office: [phone]    Connell HERO [facility] 04/19/2024, 6:26 PM

## 2024-04-20 ENCOUNTER — Inpatient Hospital Stay (HOSPITAL_COMMUNITY)

## 2024-04-20 LAB — BASIC METABOLIC PANEL WITH GFR
Anion gap: 8 (ref 5–15)
BUN: 10 mg/dL (ref 8–23)
CO2: 20 mmol/L — ABNORMAL LOW (ref 22–32)
Calcium: 8.8 mg/dL — ABNORMAL LOW (ref 8.9–10.3)
Chloride: 100 mmol/L (ref 98–111)
Creatinine, Ser: 0.53 mg/dL — ABNORMAL LOW (ref 0.61–1.24)
GFR, Estimated: >60 mL/min (ref >60)
Glucose, Bld: 86 mg/dL (ref 70–99)
Potassium: 3.7 mmol/L (ref 3.5–5.1)
Sodium: 128 mmol/L — ABNORMAL LOW (ref 135–145)

## 2024-04-20 MED FILL — Potassium Chloride Inj 2 mEq/ML: INTRAVENOUS | Qty: 40 | Status: AC

## 2024-04-20 MED FILL — Heparin Sodium (Porcine) Inj 1000 Unit/ML: Qty: 1000 | Status: AC

## 2024-04-20 MED FILL — Lidocaine HCl Local Preservative Free (PF) Inj 2%: INTRAMUSCULAR | Qty: 14 | Status: AC

## 2024-04-20 NOTE — Progress Notes (Addendum)
 9 Days Post-Op Procedure(s) (LRB): CORONARY ARTERY BYPASS GRAFTING (CABG) X THREE, USING LEFT INTERNAL MAMMARY ARTERY AND RIGHT LEG GREATER SAPHENOUS VEIN HARVESTED ENDOSCOPICALLY (N/A) ECHOCARDIOGRAM, TRANSESOPHAGEAL, INTRAOPERATIVE Subjective: Patient sitting up in bed. Patient continues to refuse SNF recommendation. Insists on discharge home when cleared medically. Pt denies chest pain, shortness of breath or palpitations. Pt reports moving bowels and passing gas normally. Reports improved sleep and appetite slightly improved.   Objective: Vital signs in last 24 hours: Temp:  [97.5 F (36.4 C)-98.2 F (36.8 C)] 97.8 F (36.6 C) (07/29 0729) Pulse Rate:  [81-118] 103 (07/29 0729) Cardiac Rhythm: Normal sinus rhythm (07/28 1900) Resp:  [12-24] 16 (07/29 0729) BP: (99-129)/(56-98) 111/98 (07/29 0729) SpO2:  [95 %-99 %] 95 % (07/29 0729) Weight:  [60 kg] 60 kg (07/29 0433)    Intake/Output from previous day: 07/28 0701 - 07/29 0700 In: 820 [P.O.:820] Out: 500 [Urine:500] Intake/Output this shift: No intake/output data recorded.  General appearance: alert, cooperative, and no distress Neurologic: intact Heart: regular rate and rhythm. Mild systolic murmur. Tachycardic.  Lungs: clear to auscultation bilaterally Abdomen: soft, non-tender; bowel sounds normal; no masses,  no organomegaly Extremities: extremities normal, atraumatic, no cyanosis or edema Wound: Incisions clean dry and intact.   Lab Results: No results for input(s): WBC, HGB, HCT, PLT in the last 72 hours. BMET:  Recent Labs    04/19/24 0340 04/20/24 0320  NA 130* 128*  K 3.5 3.7  CL 101 100  CO2 20* 20*  GLUCOSE 89 86  BUN 10 10  CREATININE 0.56* 0.53*  CALCIUM  8.9 8.8*    PT/INR: No results for input(s): LABPROT, INR in the last 72 hours. ABG    Component Value Date/Time   PHART 7.374 04/11/2024 2217   HCO3 19.9 (L) 04/11/2024 2217   TCO2 21 (L) 04/11/2024 2217   ACIDBASEDEF 5.0 (H)  04/11/2024 2217   O2SAT 98 04/11/2024 2217   CBG (last 3)  No results for input(s): GLUCAP in the last 72 hours.  Assessment/Plan: S/P Procedure(s) (LRB): CORONARY ARTERY BYPASS GRAFTING (CABG) X THREE, USING LEFT INTERNAL MAMMARY ARTERY AND RIGHT LEG GREATER SAPHENOUS VEIN HARVESTED ENDOSCOPICALLY (N/A) ECHOCARDIOGRAM, TRANSESOPHAGEAL, INTRAOPERATIVE  Plan:  CV:  Continue ASA. Continue to hold BB.Blood pressures soft but stable.   Pulm: Continue to encourage Incentive spirometry and ambulation as able.   GI: Moving bowels normally without difficulty. Appetite improving. Continue ambulation as able.   Renal: Stable. Most recent Cr .53. Continue to closely monitor I/Os.   Wound: Incisions clean, dry and intact.    LOS: 10 days    [name], PA-S 04/20/2024  Agree with Above,  patient would greatly benefit from SNF placement.  Continues to decline.  Working with PT/OT.[name] on Midodrine  for BP support.  Continues to have tachycardia.  Rocky Shad, PA-C 9:00 AM 04/20/24  Agree Dispo planning Continue PT/OT  Linnie [name], MD

## 2024-04-20 NOTE — Progress Notes (Signed)
 Mobility Specialist Progress Note;   04/20/24 1520  Orthostatic Lying   BP- Lying 102/60  Pulse- Lying 71  Orthostatic Sitting  BP- Sitting 104/64  Pulse- Sitting 79  Orthostatic Standing at 0 minutes  BP- Standing at 0 minutes 93/52  Pulse- Standing at 0 minutes 61  Orthostatic Standing at 3 minutes  BP- Standing at 3 minutes (!) 89/60  Pulse- Standing at 3 minutes 68  Mobility  Activity Ambulated with assistance  Level of Assistance Contact guard assist, steadying assist  Assistive Device Front wheel walker  Distance Ambulated (ft) 200 ft  RUE Weight Bearing Per Provider Order NWB  LUE Weight Bearing Per Provider Order NWB  Activity Response Tolerated fair  Mobility Referral Yes  Mobility visit 1 Mobility  Mobility Specialist Start Time (ACUTE ONLY) 1520  Mobility Specialist Stop Time (ACUTE ONLY) 1536  Mobility Specialist Time Calculation (min) (ACUTE ONLY) 16 min    Post-mobility: BP 113/76 (87) after ambulation  Pt agreeable to mobility. Orthostatics taken (see above), pt w/ no c/o feeling dizzy or lightheaded. Required MinG assistance during ambulation w/ chair follow for safety. BP taken in chair after ambulation (see above). Pt left comfortably in chair with all needs met, call bell in reach. Alarm on.   Lauraine Erm Mobility Specialist Please contact via SecureChat or [employer] [phone]

## 2024-04-20 NOTE — TOC Progression Note (Signed)
 Transition of Care (TOC) - Progression Note  Rayfield Gobble RN, BSN Inpatient Care Management Unit 4E- RN Case Manager See Treatment Team for direct phone #   Patient Details  Name: Jesse [name] MRN: [id number] Date of Birth: April 22, 1941  Transition of Care [facility]) CM/SW Contact  Gobble, Rayfield Hurst, RN Phone Number: 04/20/2024, 2:18 PM  Clinical Narrative:    Note pt continues to decline SNF, needs to continue to work on mobility.   CM has reached out to [employer] and have confirmed that they can service pt for [facility] needs. Will follow for final dispo.    Expected Discharge Plan: Home w Home Health Services Barriers to Discharge: Continued Medical Work up               Expected Discharge Plan and Services   Discharge Planning Services: CM Consult Post Acute Care Choice: Home Health Living arrangements for the past 2 months: Single Family Home                                       Social Drivers of Health (SDOH) Interventions SDOH Screenings   Food Insecurity: No Food Insecurity (04/10/2024)  Housing: Low Risk  (04/10/2024)  Transportation Needs: No Transportation Needs (04/10/2024)  Utilities: Not At Risk (04/10/2024)  Depression (PHQ2-9): Low Risk  (04/06/2024)  Social Connections: Socially Isolated (04/11/2024)  Tobacco Use: Low Risk  (04/11/2024)    Readmission Risk Interventions     No data to display

## 2024-04-20 NOTE — Progress Notes (Signed)
 Physical Therapy Treatment Patient Details Name: Jesse [name] MRN: [id number] DOB: [birth year] Today's Date: 04/20/2024   History of Present Illness 83 year old male admitted 7/19  with acute NSTEMI.  Patient underwent cardiac catheterization which showed multivessel coronary artery disease, underwent CABG x 3 7/20. Episode symptomatic orthostatic hypotension during OT/PT sessions 7/23. PMH: hypertension, hyperlipidemia, polycythemia vera status post phlebectomy last time on 7/15    PT Comments  Pt received in recliner after working with mobility specialist ~30 mins prior, pt requesting return to bed from chair. PTA reinforced benefits of OOB to chair 1-2 hours multiple times a day, sternal precs and benefits of aggressive OOB mobility, pt with poor carryover of sternal precs/Move it the Tube education from previous therapy and mobility sessions. Pt needing continued multimodal cues for compliance with precs and remains at increased risk of surgical complications due to poor technique with transfers/bed mobility unless given constant cues for technique/motor sequencing. Pt would benefit from SLP cog eval if indicated due to continued memory deficits/poor carryover of education from therapy sessions with this and other therapists and pt reporting he does not have consistent supervision/assist at home. Pt encouraged to ask family/friends for closer to 18-24 hours supervision/assist initially at home to ensure he is able to stay home since he refuses post-acute rehab. Patient will benefit from continued inpatient follow up therapy, <3 hours/day, however refusing this at this time, in which case recommend max [facility] services.    If plan is discharge home, recommend the following: A little help with walking and/or transfers;A little help with bathing/dressing/bathroom;Assistance with cooking/housework;Assist for transportation;Supervision due to cognitive status   Can travel by private vehicle     Yes   Equipment Recommendations  Rollator (4 wheels)    Recommendations for Other Services Speech consult;Other (comment) (SLP higher level cognitive eval)     Precautions / Restrictions Precautions Precautions: Sternal;Fall Precaution Booklet Issued: Yes (comment) Recall of Precautions/Restrictions: Impaired Precaution/Restrictions Comments: recalls 1-2 out of 4 precs when prompted and needing hints to recall these Restrictions Weight Bearing Restrictions Per Provider Order: No Other Position/Activity Restrictions: sternal precautions     Mobility  Bed Mobility Overal bed mobility: Needs Assistance           Sit to sidelying: Contact guard assist General bed mobility comments: CGA after mod sequencing cues for sternal precs, pt using pillow to hold.    Transfers Overall transfer level: Needs assistance Equipment used: Rolling walker (2 wheels) Transfers: Sit to/from Stand Sit to Stand: Contact guard assist, Min assist           General transfer comment: chair>RW with minA after x3 unsuccessful attempts with CGA, then x5 reps from EOB with variable CGA to minA, use of momentum strategy to achieve upright. Dense cues for safe UE placement as pt forgets to put hands back on his knees prior to sitting.    Ambulation/Gait Ambulation/Gait assistance: Contact guard assist Gait Distance (Feet): 45 Feet Assistive device: Rolling walker (2 wheels) Gait Pattern/deviations: Step-through pattern, Decreased stride length, Trunk flexed Gait velocity: decr     General Gait Details: pt c/o BLE weakness/fatigue so short distance in room.   Stairs             Wheelchair Mobility     Tilt Bed    Modified Rankin (Stroke Patients Only)       Balance Overall balance assessment: Needs assistance Sitting-balance support: No upper extremity supported, Feet supported Sitting balance-Leahy Scale: Good     Standing balance  support: Bilateral upper extremity supported,  During functional activity, Reliant on assistive device for balance Standing balance-Leahy Scale: Poor Standing balance comment: reliant on RW, needs single UE support for static standing                            Communication Communication Communication: No apparent difficulties  Cognition Arousal: Alert Behavior During Therapy: Flat affect   PT - Cognitive impairments: No family/caregiver present to determine baseline, Memory, Sequencing, Safety/Judgement, Problem solving, Awareness                       PT - Cognition Comments: Difficulty following sternal precautions, pt c/o feeling floppy-headed but denies lightheadedness/dizziness. Pt with decreased recall of risks of non-compliance with sternal precs and would benefit from higher level cog eval (from SLP?) as he has been instructed on sternal precs for over 7 days in a row and still is unable to fully recall and understand Move in the Tube precs/risks of non-compliance Following commands: Impaired Following commands impaired: Only follows one step commands consistently, Follows one step commands with increased time, Follows multi-step commands inconsistently    Cueing Cueing Techniques: Verbal cues, Tactile cues, Gestural cues  Exercises Other Exercises Other Exercises: STS x 5 reps reciprocal from EOB pushing on bil knees, use of momentum to achieve upright Other Exercises: IS base broken, new IS brought to room at end of session pt encouraged to use hourly    General Comments General comments (skin integrity, edema, etc.): BP WFL seated pre/post gait trial, pt c/o BLE fatigue standing and not able to tolerate standing BP post-exertion with arm relaxed for accurate reading prior to needing to sit.      Pertinent Vitals/Pain Pain Assessment Pain Assessment: Faces Faces Pain Scale: Hurts little more Pain Location: incision site Pain Descriptors / Indicators: Guarding, Grimacing, Sore Pain  Intervention(s): Monitored during session, Repositioned    Home Living                          Prior Function            PT Goals (current goals can now be found in the care plan section) Acute Rehab PT Goals Patient Stated Goal: to go home PT Goal Formulation: With patient Time For Goal Achievement: 04/27/24 Progress towards PT goals: Progressing toward goals    Frequency    Min 3X/week      PT Plan      Co-evaluation              AM-PAC PT 6 Clicks Mobility   Outcome Measure  Help needed turning from your back to your side while in a flat bed without using bedrails?: A Little Help needed moving from lying on your back to sitting on the side of a flat bed without using bedrails?: A Lot (mod cues/without rail) Help needed moving to and from a bed to a chair (including a wheelchair)?: A Little Help needed standing up from a chair using your arms (e.g., wheelchair or bedside chair)?: A Little Help needed to walk in hospital room?: A Little Help needed climbing 3-5 steps with a railing? : Total 6 Click Score: 15    End of Session Equipment Utilized During Treatment: Gait belt Activity Tolerance: Patient tolerated treatment well;Patient limited by fatigue Patient left: in bed;with call bell/phone within reach;with bed alarm set;Other (comment);with nursing/sitter in room (bed  in chair posture as RN about to give him meds) Nurse Communication: Mobility status;Other (comment) (pt c/o fatigue with standing) PT Visit Diagnosis: Muscle weakness (generalized) (M62.81);Other abnormalities of gait and mobility (R26.89)     Time: 8389-8378 PT Time Calculation (min) (ACUTE ONLY): 11 min  Charges:    $Therapeutic Activity: 8-22 mins PT General Charges $$ ACUTE PT VISIT: 1 Visit                     Davonne Baby P., PTA Acute Rehabilitation Services Secure Chat Preferred 9a-5:30pm Office: [phone]    Connell HERO [facility] 04/20/2024, 4:49 PM

## 2024-04-21 NOTE — Progress Notes (Signed)
 Occupational Therapy Treatment Patient Details Name: Jesse [name] MRN: [id number] DOB: 1940-10-02 Today's Date: 04/21/2024   History of present illness 83 year old male admitted 7/19  with acute NSTEMI.  Patient underwent cardiac catheterization which showed multivessel coronary artery disease, underwent CABG x 3 7/20. Episode symptomatic orthostatic hypotension during OT/PT sessions 7/23. PMH: hypertension, hyperlipidemia, polycythemia vera status post phlebectomy last time on 7/15   OT comments  Pt is now agreeable to attempt to go for CIR and if not SNF. Pt at this time when asking prior to starting session about sternal precaution only able to recall not to wipe around self when completion of peri care following bm. He also was unable to answer why not to complete this. Pt again visually and verbally reviewed precautions with handout prior to movement. Pt completed Short Blessed Test in session scored a 12 (which impairment consistent with dementia). He has progressed with ability in complete transfers with with light min assist to min guard but was limited due to BP in session. At this time completed UE dressing with min guard as to follow precautions. Then completed further sit to stands with marching prior to attempts but still limited due to BP. Patient will benefit from continued inpatient follow up therapy, <3 hours/day.   BP:  Supine: 102/72 (82) hr 115 o2 99% Sitting: 95/ 68 (78) hr 108 02 99% Post transfer to Chair: 110/73 (85) hr 109 o2 96% Standing: 100/60 (70) HR 121 o2 91 % Post marching in standing: 86/ 55 (66) reported feeling dizzy Re attempted standing: 83/57 (65) became SOB      If plan is discharge home, recommend the following:  A little help with walking and/or transfers;A little help with bathing/dressing/bathroom;Assistance with cooking/housework;Direct supervision/assist for medications management;Assist for transportation;Direct supervision/assist for financial  management   Equipment Recommendations  None recommended by OT    Recommendations for Other Services Rehab consult    Precautions / Restrictions Precautions Precautions: Sternal;Fall Precaution Booklet Issued: Yes (comment) Recall of Precautions/Restrictions: Impaired Precaution/Restrictions Comments: Pt intially only reported about they can not wipe around but unable to recall anything else or why Restrictions Weight Bearing Restrictions Per Provider Order: Yes RUE Weight Bearing Per Provider Order: Non weight bearing RUE Partial Weight Bearing Percentage or Pounds: <5lbs LUE Weight Bearing Per Provider Order: Non weight bearing LUE Partial Weight Bearing Percentage or Pounds: <5lbs Other Position/Activity Restrictions: sternal precautions       Mobility Bed Mobility Overal bed mobility: Needs Assistance Bed Mobility: Rolling, Supine to Sit, Sidelying to Sit Rolling: Contact guard assist Sidelying to sit: Contact guard assist Supine to sit: Min assist     General bed mobility comments: Pt was verbally and visually reviewed prior to attempting to be able to follow precautions    Transfers Overall transfer level: Needs assistance Equipment used: Rolling walker (2 wheels) Transfers: Sit to/from Stand Sit to Stand: Contact guard assist, Min assist           General transfer comment: Pt is only requiring light min assist to CGA with ussually 2 trials with rocking to attempt to compelte     Balance Overall balance assessment: Needs assistance Sitting-balance support: Feet supported Sitting balance-Leahy Scale: Good     Standing balance support: Bilateral upper extremity supported, No upper extremity supported Standing balance-Leahy Scale: Fair                             ADL either performed  or assessed with clinical judgement   ADL Overall ADL's : Needs assistance/impaired Eating/Feeding: Independent;Sitting   Grooming: Wash/dry face;Set  up;Sitting   Upper Body Bathing: Contact guard assist;Sitting   Lower Body Bathing: Minimal assistance;Sitting/lateral leans;Sit to/from stand   Upper Body Dressing : Contact guard assist;Sitting   Lower Body Dressing: Contact guard assist;Sitting/lateral leans;Sit to/from stand   Toilet Transfer: Minimal assistance;Rolling walker (2 wheels)           Functional mobility during ADLs: Contact guard assist;Rolling walker (2 wheels)      Extremity/Trunk Assessment Upper Extremity Assessment Upper Extremity Assessment: Generalized weakness   Lower Extremity Assessment Lower Extremity Assessment: Defer to PT evaluation        Vision   Vision Assessment?: No apparent visual deficits   Perception     Praxis     Communication Communication Communication: No apparent difficulties   Cognition Arousal: Alert Behavior During Therapy: Flat affect Cognition: Cognition impaired     Awareness: Intellectual awareness intact, Online awareness impaired Memory impairment (select all impairments): Short-term memory Attention impairment (select first level of impairment): Divided attention Executive functioning impairment (select all impairments): Sequencing, Problem solving, Organization OT - Cognition Comments: Pt completed the Short Blessed Test scored a 12 (which impairmetn consistent with Dementia)                 Following commands: Impaired        Cueing   Cueing Techniques: Verbal cues, Tactile cues, Gestural cues  Exercises      Shoulder Instructions       General Comments      Pertinent Vitals/ Pain       Pain Assessment Pain Assessment: No/denies pain  Home Living                                          Prior Functioning/Environment              Frequency  Min 2X/week        Progress Toward Goals  OT Goals(current goals can now be found in the care plan section)  Progress towards OT goals: Progressing toward  goals  Acute Rehab OT Goals Patient Stated Goal: to get better OT Goal Formulation: With patient Time For Goal Achievement: 04/29/24 Potential to Achieve Goals: Good ADL Goals Pt Will Perform Grooming: with modified independence;sitting Pt Will Perform Upper Body Bathing: with modified independence;sitting Pt Will Perform Lower Body Bathing: with contact guard assist;sit to/from stand Pt Will Perform Upper Body Dressing: with modified independence;sitting Pt Will Perform Lower Body Dressing: with contact guard assist;with adaptive equipment;sit to/from stand Pt Will Transfer to Toilet: with modified independence;ambulating  Plan      Co-evaluation                 AM-PAC OT 6 Clicks Daily Activity     Outcome Measure   Help from another person eating meals?: None Help from another person taking care of personal grooming?: A Little Help from another person toileting, which includes using toliet, bedpan, or urinal?: A Lot Help from another person bathing (including washing, rinsing, drying)?: A Little Help from another person to put on and taking off regular upper body clothing?: A Little Help from another person to put on and taking off regular lower body clothing?: A Little 6 Click Score: 18    End of Session Equipment Utilized During  Treatment: Gait belt;Rolling walker (2 wheels)  OT Visit Diagnosis: Unsteadiness on feet (R26.81);Other abnormalities of gait and mobility (R26.89);Muscle weakness (generalized) (M62.81);Pain   Activity Tolerance Other (comment) (bp,cognition)   Patient Left in chair;with call bell/phone within reach;with chair alarm set   Nurse Communication Mobility status        Time: 9099-9052 OT Time Calculation (min): 47 min  Charges: OT General Charges $OT Visit: 1 Visit OT Treatments $Self Care/Home Management : 38-52 mins  Warrick POUR OTR/L  Acute Rehab Services  [phone] office number   Warrick Berber 04/21/2024, 10:01 AM

## 2024-04-21 NOTE — TOC Progression Note (Signed)
 Transition of Care [facility]) - Progression Note    Patient Details  Name: Jesse [name] MRN: [id number] Date of Birth: 1941-08-29  Transition of [facility]) CM/SW Contact  Montie LOISE Louder, [place] Phone Number: 04/21/2024, 5:30 PM  Clinical Narrative:     CSW informed by PA, patient, now agreeable to short term rehab at [facility]. CSW met with patient at bedside and explained role. Patient confirms he is agreeable to rehab at [facility]. CSW explained the SNF process.   TOC will provide bed offers once available   Montie Louder, MSW, LCSW Clinical Social Worker    Expected Discharge Plan: Skilled Nursing Facility Barriers to Discharge: Continued Medical Work up               Expected Discharge Plan and Services   Discharge Planning Services: CM Consult Post Acute Care Choice: Home Health Living arrangements for the past 2 months: Single Family Home                                       Social Drivers of Health (SDOH) Interventions SDOH Screenings   Food Insecurity: No Food Insecurity (04/10/2024)  Housing: Low Risk  (04/10/2024)  Transportation Needs: No Transportation Needs (04/10/2024)  Utilities: Not At Risk (04/10/2024)  Depression (PHQ2-9): Low Risk  (04/06/2024)  Social Connections: Socially Isolated (04/11/2024)  Tobacco Use: Low Risk  (04/11/2024)    Readmission Risk Interventions     No data to display

## 2024-04-21 NOTE — Progress Notes (Signed)
 Mobility Specialist Progress Note;   04/21/24 1126  Mobility  Activity Pivoted/transferred from chair to bed  Level of Assistance Contact guard assist, steadying assist  Assistive Device Front wheel walker  Distance Ambulated (ft) 3 ft  RUE Weight Bearing Per Provider Order NWB  LUE Weight Bearing Per Provider Order NWB  Activity Response Tolerated well  Mobility Referral Yes  Mobility visit 1 Mobility  Mobility Specialist Start Time (ACUTE ONLY) 1126  Mobility Specialist Stop Time (ACUTE ONLY) 1137  Mobility Specialist Time Calculation (min) (ACUTE ONLY) 11 min   Pt requesting assistance back to bed. Required MinG assistance to safely transfer from chair to bed. No c/o when asked. Pt left comfortably in bed with all needs met, alarm on. RN notified.   Lauraine Erm Mobility Specialist Please contact via SecureChat or [employer] [phone]

## 2024-04-21 NOTE — NC FL2 (Signed)
 [place]  MEDICAID FL2 LEVEL OF CARE FORM     IDENTIFICATION  Patient Name: Jesse [name] Birthdate: July 02, 1941 Sex: male Admission Date (Current Location): 04/10/2024  [facility] and [place] Number:  Producer, television/film/video and Address:  [facility], [address] [place], [place] [address]      Provider Number: [id number]  Attending Physician Name and Address:  Shyrl Linnie KIDD, MD  Relative Name and Phone Number:       Current Level of Care: Hospital Recommended Level of Care: Skilled Nursing Facility Prior Approval Number:    Date Approved/Denied:   PASRR Number: [id number]  Discharge Plan: SNF    Current Diagnoses: Patient Active Problem List   Diagnosis Date Noted   S/P CABG x 3 04/11/2024   NSTEMI (non-ST elevated myocardial infarction) (HCC) 04/10/2024   UPJ obstruction, acquired 03/02/2021   Diverticulosis 03/02/2021   Cervical spondylosis 03/02/2021   Unspecified protein-calorie malnutrition (HCC) 03/02/2021   Transaminitis 03/02/2021   Hyponatremia 02/22/2021   Essential hypertension 02/22/2021   Hyperlipidemia 02/22/2021   Fall at home, subsequent encounter 02/22/2021   Need for prophylactic vaccination and inoculation against influenza 06/17/2016   Polycythemia rubra vera (HCC) 09/02/2011   HZ (herpes zoster) 09/02/2011    Orientation RESPIRATION BLADDER Height & Weight     Self, Time, Situation, Place  Normal Continent, External catheter Weight: 132 lb 4.4 oz (60 kg) Height:  5' 6 (167.6 cm)  BEHAVIORAL SYMPTOMS/MOOD NEUROLOGICAL BOWEL NUTRITION STATUS      Continent Diet (please see discharge summary)  AMBULATORY STATUS COMMUNICATION OF NEEDS Skin   Limited Assist Verbally Surgical wounds (surgical incision right leg, incision chest)                       Personal Care Assistance Level of Assistance  Bathing, Feeding, Dressing Bathing Assistance: Limited assistance Feeding assistance: Independent Dressing  Assistance: Limited assistance     Functional Limitations Info  Sight, Hearing, Speech Sight Info: Adequate (glasses) Hearing Info: Adequate Speech Info: Adequate    SPECIAL CARE FACTORS FREQUENCY  PT (By licensed PT), OT (By licensed OT)     PT Frequency: 5x per week OT Frequency: 5x per week            Contractures Contractures Info: Not present    Additional Factors Info  Code Status Code Status Info: FULL             Current Medications (04/21/2024):  This is the current hospital active medication list Current Facility-Administered Medications  Medication Dose Route Frequency Provider Last Rate Last Admin   ALPRAZolam  (XANAX ) tablet 0.5 mg  0.5 mg Oral QHS Barrett, Erin R, PA-C   0.5 mg at 04/20/24 2114   aspirin  EC tablet 325 mg  325 mg Oral Daily Barrett, Erin R, PA-C   325 mg at 04/21/24 9190   Or   aspirin  chewable tablet 324 mg  324 mg Per Tube Daily Barrett, Erin R, PA-C       atorvastatin  (LIPITOR ) tablet 80 mg  80 mg Oral Daily Barrett, Erin R, PA-C   80 mg at 04/21/24 0809   bisacodyl  (DULCOLAX) EC tablet 10 mg  10 mg Oral Daily Barrett, Erin R, PA-C   10 mg at 04/20/24 9188   Or   bisacodyl  (DULCOLAX) suppository 10 mg  10 mg Rectal Daily Barrett, Erin R, PA-C       Chlorhexidine  Gluconate Cloth 2 % PADS 6 each  6 each Topical Daily Shyrl Linnie KIDD, MD   6 each at 04/18/24 0920   [facility], Patient & Family Education   Does not apply Once Barrett, Erin R, PA-C       docusate sodium  (COLACE) capsule 200 mg  200 mg Oral BID Bitonti, Michael T, RPH   200 mg at 04/21/24 9190   enoxaparin  (LOVENOX ) injection 40 mg  40 mg Subcutaneous QHS Barrett, Erin R, PA-C   40 mg at 04/20/24 2114   feeding supplement (ENSURE PLUS HIGH PROTEIN) liquid 237 mL  237 mL Oral BID BM Barrett, Erin R, PA-C   237 mL at 04/18/24 9078   gabapentin  (NEURONTIN ) capsule 200 mg  200 mg Oral QHS Paytes, Austin A, RPH   200 mg at 04/20/24 2114   midodrine  (PROAMATINE )  tablet 10 mg  10 mg Oral TID WC Raguel Con RAMAN, PA-C   10 mg at 04/21/24 1225   ondansetron  (ZOFRAN ) injection 4 mg  4 mg Intravenous Q6H PRN Barrett, Erin R, PA-C   4 mg at 04/13/24 1049   Oral care mouth rinse  15 mL Mouth Rinse PRN Lightfoot, Linnie KIDD, MD       oxyCODONE  (Oxy IR/ROXICODONE ) immediate release tablet 5-10 mg  5-10 mg Oral Q3H PRN Barrett, Erin R, PA-C   5 mg at 04/19/24 2155   pantoprazole  (PROTONIX ) EC tablet 40 mg  40 mg Oral Daily Barrett, Erin R, PA-C   40 mg at 04/21/24 0809   sodium chloride  flush (NS) 0.9 % injection 10-40 mL  10-40 mL Intracatheter Q12H Lightfoot, Linnie KIDD, MD   10 mL at 04/20/24 2115   sodium chloride  flush (NS) 0.9 % injection 10-40 mL  10-40 mL Intracatheter PRN Lightfoot, Linnie KIDD, MD       sodium chloride  flush (NS) 0.9 % injection 3 mL  3 mL Intravenous Q12H Barrett, Erin R, PA-C   3 mL at 04/21/24 0810   sodium chloride  flush (NS) 0.9 % injection 3 mL  3 mL Intravenous PRN Barrett, Erin R, PA-C   3 mL at 04/14/24 9043   tamsulosin  (FLOMAX ) capsule 0.4 mg  0.4 mg Oral Daily Barrett, Erin R, PA-C   0.4 mg at 04/21/24 0809     Discharge Medications: Please see discharge summary for a list of discharge medications.  Relevant Imaging Results:  Relevant Lab Results:   Additional Information SSN [id number]  Montie LOISE Louder, LCSW

## 2024-04-21 NOTE — Progress Notes (Signed)
  Inpatient Rehabilitation Admissions Coordinator   Asked by [facility] RN CM to assess for candidacy for potential CIR admit. Noted patient contact guard assist 110 feet with therapy yesterday. Min assist bed mobility with sternal precautions. He is not in need of CIR level rehab at this time. Recommend to pursue other rehab venues at this time.  Heron Leavell, RN, MSN Rehab Admissions Coordinator [phone] 04/21/2024 9:53 AM

## 2024-04-21 NOTE — Progress Notes (Addendum)
 10 Days Post-Op Procedure(s) (LRB): CORONARY ARTERY BYPASS GRAFTING (CABG) X THREE, USING LEFT INTERNAL MAMMARY ARTERY AND RIGHT LEG GREATER SAPHENOUS VEIN HARVESTED ENDOSCOPICALLY (N/A) ECHOCARDIOGRAM, TRANSESOPHAGEAL, INTRAOPERATIVE Subjective: Patient sitting up in bed. Patient reports no chest pain, shortness of breath, or palpitations. Moving bowels and passing gas without pain or discomfort. Reports some dizziness during ambulation/PT mobility visits. Patient is now agreeable to SNF. Would like to ensure he is not placed at location where he had previous bad experience.  Objective: Vital signs in last 24 hours: Temp:  [97.5 F (36.4 C)-98.4 F (36.9 C)] 98 F (36.7 C) (07/30 0317) Pulse Rate:  [74-99] 74 (07/30 0317) Cardiac Rhythm: Normal sinus rhythm (07/29 1900) Resp:  [16-21] 16 (07/30 0317) BP: (91-117)/(62-83) 105/74 (07/30 0317) SpO2:  [95 %-98 %] 98 % (07/30 0317)    Intake/Output from previous day: No intake/output data recorded. Intake/Output this shift: No intake/output data recorded.  General appearance: alert, cooperative, and no distress Neurologic: intact Heart: regular rate and rhythm Lungs: clear to auscultation bilaterally Abdomen: soft, non-tender; bowel sounds normal; no masses,  no organomegaly Extremities: extremities normal, atraumatic, no cyanosis or edema Wound: Incisions clean, dry, and intact.   Lab Results: No results for input(s): WBC, HGB, HCT, PLT in the last 72 hours. BMET:  Recent Labs    04/19/24 0340 04/20/24 0320  NA 130* 128*  K 3.5 3.7  CL 101 100  CO2 20* 20*  GLUCOSE 89 86  BUN 10 10  CREATININE 0.56* 0.53*  CALCIUM  8.9 8.8*    PT/INR: No results for input(s): LABPROT, INR in the last 72 hours. ABG    Component Value Date/Time   PHART 7.374 04/11/2024 2217   HCO3 19.9 (L) 04/11/2024 2217   TCO2 21 (L) 04/11/2024 2217   ACIDBASEDEF 5.0 (H) 04/11/2024 2217   O2SAT 98 04/11/2024 2217   CBG (last 3)  No  results for input(s): GLUCAP in the last 72 hours.  Assessment/Plan: S/P Procedure(s) (LRB): CORONARY ARTERY BYPASS GRAFTING (CABG) X THREE, USING LEFT INTERNAL MAMMARY ARTERY AND RIGHT LEG GREATER SAPHENOUS VEIN HARVESTED ENDOSCOPICALLY (N/A) ECHOCARDIOGRAM, TRANSESOPHAGEAL, INTRAOPERATIVE  CV: NRR. Tachycardia improving. Blood pressures still soft but stable. Monitor orthostatics closely.  Pulm: Lungs clear. Continue to encourage IS and mobility as tolerated.  GI: Patient moving bowels without pain. Abdomen is soft and non tender. Bowel sounds present.  Renal: Kidney function stable.  Wound: Incisions clean dry and intact.   Plan: Patient is now agreeable to SNF placement. However, patient may be a candidate for/benefit from CIR rehabilitation in hospital. Will await further guidance/instruction on possible CIR placement/recommendations from PT/OT.    LOS: 11 days    [name], PA-S 04/21/2024   Dispo planning Will look into SNF  Jesse [name] O Jesse [name]

## 2024-04-22 ENCOUNTER — Telehealth: Admitting: Thoracic Surgery (Cardiothoracic Vascular Surgery)

## 2024-04-22 DIAGNOSIS — E7800 Pure hypercholesterolemia, unspecified: Secondary | ICD-10-CM | POA: Diagnosis not present

## 2024-04-22 DIAGNOSIS — I10 Essential (primary) hypertension: Secondary | ICD-10-CM | POA: Diagnosis not present

## 2024-04-22 LAB — BASIC METABOLIC PANEL WITH GFR
Anion gap: 7 (ref 5–15)
BUN: 11 mg/dL (ref 8–23)
CO2: 22 mmol/L (ref 22–32)
Calcium: 9.1 mg/dL (ref 8.9–10.3)
Chloride: 101 mmol/L (ref 98–111)
Creatinine, Ser: 0.71 mg/dL (ref 0.61–1.24)
GFR, Estimated: >60 mL/min (ref >60)
Glucose, Bld: 131 mg/dL — ABNORMAL HIGH (ref 70–99)
Potassium: 3.4 mmol/L — ABNORMAL LOW (ref 3.5–5.1)
Sodium: 130 mmol/L — ABNORMAL LOW (ref 135–145)

## 2024-04-22 LAB — GLUCOSE, CAPILLARY: Glucose-Capillary: 124 mg/dL — ABNORMAL HIGH (ref 70–99)

## 2024-04-22 MED ORDER — POTASSIUM CHLORIDE CRYS ER 20 MEQ PO TBCR
40.0000 meq | EXTENDED_RELEASE_TABLET | Freq: Once | ORAL | Status: AC
  Administered 2024-04-22: 40 meq via ORAL
  Filled 2024-04-22: qty 2

## 2024-04-22 NOTE — Progress Notes (Addendum)
 [address] [place] [employer] [address]             [phone]      11 Days Post-Op Procedure(s) (LRB): CORONARY ARTERY BYPASS GRAFTING (CABG) X THREE, USING LEFT INTERNAL MAMMARY ARTERY AND RIGHT LEG GREATER SAPHENOUS VEIN HARVESTED ENDOSCOPICALLY (N/A) ECHOCARDIOGRAM, TRANSESOPHAGEAL, INTRAOPERATIVE Subjective: Patient with no new complaints, reports he didn't walk much yesterday.  Objective: Vital signs in last 24 hours: Temp:  [97.7 F (36.5 C)-98.6 F (37 C)] 98.3 F (36.8 C) (07/31 0449) Pulse Rate:  [75-94] 90 (07/31 0449) Cardiac Rhythm: Heart block;Sinus tachycardia (07/30 0931) Resp:  [14-25] 14 (07/31 0449) BP: (87-116)/(65-90) 115/76 (07/31 0449) SpO2:  [94 %-100 %] 96 % (07/31 0449) Weight:  [61.5 kg] 61.5 kg (07/31 0449)  Hemodynamic parameters for last 24 hours:    Intake/Output from previous day: 07/30 0701 - 07/31 0700 In: 240 [P.O.:240] Out: 1500 [Urine:1500] Intake/Output this shift: No intake/output data recorded.  General appearance: alert, cooperative, and no distress Neurologic: intact Heart: regular rate and rhythm, soft systolic murmur, no rubs Lungs: clear to auscultation bilaterally Abdomen: soft, non-tender; bowel sounds normal; no masses,  no organomegaly Extremities: extremities normal, atraumatic, no cyanosis or edema Wound: Clean and dry without sign of infection  Lab Results: No results for input(s): WBC, HGB, HCT, PLT in the last 72 hours. BMET:  Recent Labs    04/20/24 0320  NA 128*  K 3.7  CL 100  CO2 20*  GLUCOSE 86  BUN 10  CREATININE 0.53*  CALCIUM  8.8*    PT/INR: No results for input(s): LABPROT, INR in the last 72 hours. ABG    Component Value Date/Time   PHART 7.374 04/11/2024 2217   HCO3 19.9 (L) 04/11/2024 2217   TCO2 21 (L) 04/11/2024 2217   ACIDBASEDEF 5.0 (H) 04/11/2024 2217   O2SAT 98 04/11/2024 2217   CBG (last 3)  No results for input(s): GLUCAP in the last 72  hours.  Assessment/Plan: S/P Procedure(s) (LRB): CORONARY ARTERY BYPASS GRAFTING (CABG) X THREE, USING LEFT INTERNAL MAMMARY ARTERY AND RIGHT LEG GREATER SAPHENOUS VEIN HARVESTED ENDOSCOPICALLY (N/A) ECHOCARDIOGRAM, TRANSESOPHAGEAL, INTRAOPERATIVE  Neuro: Patient A&Ox4 but PT reports memory deficits and recommends SLP cognitive evaluation, will discuss with surgeon.   CV: S/P NSTEMI, risks outweigh benefits of Plavix. Continue ASA 325mg  daily. Orthostatic hypotension, BP 115/76 this AM on Midodrine  10mg  TID. Holding BB due to hypotension and orthostasis. NSR this AM, HR 80s this AM. Continues to have bursts of irregular rhythm, looks like mainly PACs but HR remains in the 80s, under much better control. No further bursts of rates into the 130s.    Pulm: Saturating well on RA. Last CXR 07/29 with improved left basilar atelectasis and improved small pleural effusion. Encourage IS and ambulation   GI: +BM yesterday 2 days ago, tolerating diet. Continue bowel regimen. Does not feel bloated, continues to pass gas. Needs to increase diet but reports the food here is bland.    Renal: Cr 0.53, stable. Mild hyponatremia, Na down to 128 on 07/29. Possibly dehydrated, encourage PO fluid intake. K 3.7. UO 1500cc recorded. Under preop weight. No diuresis. Continue Flomax . Will recheck bmet.    DVT Prophylaxis: Lovenox    Deconditioning: Patient now agreeable to SNF, continue to work with PT/OT.    Dispo: Plan to d/c to SNF once bed is available    LOS: 12 days    Con GORMAN Bend, PA-C 04/22/2024  Agree Dispo planning  Jesse [name]

## 2024-04-22 NOTE — Progress Notes (Signed)
 Mobility Specialist Progress Note:    04/22/24 1400  Mobility  Activity Ambulated with assistance  Level of Assistance Contact guard assist, steadying assist  Assistive Device Front wheel walker  Distance Ambulated (ft) 300 ft  RUE Weight Bearing Per Provider Order NWB  LUE Weight Bearing Per Provider Order NWB  Activity Response Tolerated well  Mobility Referral Yes  Mobility visit 1 Mobility  Mobility Specialist Start Time (ACUTE ONLY) 1400  Mobility Specialist Stop Time (ACUTE ONLY) 1415  Mobility Specialist Time Calculation (min) (ACUTE ONLY) 15 min   Pt received in chair, agreeable to mobility session. Ambulated in hallway with CGA, RW, and a chair follow for safety. Tolerated well, asx throughout. Returned pt to room, lying in bed with all needs met. See BP below.  Sitting in Chair: 109/92 (100) Standing:103/75 (84) Post Mobility: 130/79 (94)   Deztinee Lohmeyer Mobility Specialist Please contact via Special educational needs teacher or  Rehab office at [phone]

## 2024-04-22 NOTE — TOC Progression Note (Signed)
 Transition of Care [facility]) - Progression Note    Patient Details  Name: Jesse [name] MRN: [id number] Date of Birth: [birth year]  Transition of Care [facility]) CM/SW Contact  Montie LOISE Louder, [place] Phone Number: 04/22/2024, 1:16 PM  Clinical Narrative:     CSW called pateint's niece, Amy at his request to inform of bed offers.  CSW informed of SNF bed offers along with Medicare.gov star ratings. She states she will review and discuss bed offers with patient.  CSW met with patient, informed of bed offers along with Medicare star ratings. Patient states he was familiar with [facility] and was agreeable to discharge there.   [employer] confirmed availability. TOC will start SNF authorization.  Montie Louder, MSW, LCSW Clinical Social Worker    Expected Discharge Plan: Skilled Nursing Facility Barriers to Discharge: Insurance Authorization               Expected Discharge Plan and Services   Discharge Planning Services: CM Consult Post Acute Care Choice: Home Health Living arrangements for the past 2 months: Single Family Home                                       Social Drivers of Health (SDOH) Interventions SDOH Screenings   Food Insecurity: No Food Insecurity (04/10/2024)  Housing: Low Risk  (04/10/2024)  Transportation Needs: No Transportation Needs (04/10/2024)  Utilities: Not At Risk (04/10/2024)  Depression (PHQ2-9): Low Risk  (04/06/2024)  Social Connections: Socially Isolated (04/11/2024)  Tobacco Use: Low Risk  (04/11/2024)    Readmission Risk Interventions     No data to display

## 2024-04-22 NOTE — Progress Notes (Signed)
 PT Cancellation Note  Patient Details Name: Jesse [name] MRN: [id number] DOB: 1941/04/16   Cancelled Treatment:    Reason Eval/Treat Not Completed: (P) Other (comment) (Pt just received lunch and needed some assist for set-up of tray, which PTA provided, and brought him ice water per his request (nursing staff notified in case monitoring PO intake).) Chart reviewed in PM and pt just worked with mobility specialist, so plan to reattempt following date as schedule permits.   [name] 04/22/2024, 4:17 PM

## 2024-04-22 NOTE — Progress Notes (Signed)
 Mobility Specialist Progress Note:    04/22/24 0856  Mobility  Activity Pivoted/transferred from bed to chair  Level of Assistance Minimal assist, patient does 75% or more  Assistive Device Front wheel walker  Distance Ambulated (ft) 5 ft  RUE Weight Bearing Per Provider Order NWB  LUE Weight Bearing Per Provider Order NWB  Activity Response Tolerated well  Mobility Referral Yes  Mobility visit 1 Mobility  Mobility Specialist Start Time (ACUTE ONLY) 0856  Mobility Specialist Stop Time (ACUTE ONLY) 0900  Mobility Specialist Time Calculation (min) (ACUTE ONLY) 4 min   Pt received in bed, agreeable to transferring B>C via RW. Required MinA for safety d/t posterior lean when pivoting. Tolerated well, asx throughout. Left pt in chair with all needs met, chair alarm on, call bell in reach.   Ashely Goosby Mobility Specialist Please contact via Special educational needs teacher or  Rehab office at [phone]

## 2024-04-23 ENCOUNTER — Telehealth: Admitting: Thoracic Surgery (Cardiothoracic Vascular Surgery)

## 2024-04-23 DIAGNOSIS — M6281 Muscle weakness (generalized): Secondary | ICD-10-CM | POA: Diagnosis not present

## 2024-04-23 DIAGNOSIS — Z951 Presence of aortocoronary bypass graft: Secondary | ICD-10-CM | POA: Diagnosis not present

## 2024-04-23 DIAGNOSIS — I222 Subsequent non-ST elevation (NSTEMI) myocardial infarction: Secondary | ICD-10-CM | POA: Diagnosis not present

## 2024-04-23 DIAGNOSIS — R2681 Unsteadiness on feet: Secondary | ICD-10-CM | POA: Diagnosis not present

## 2024-04-23 DIAGNOSIS — I213 ST elevation (STEMI) myocardial infarction of unspecified site: Secondary | ICD-10-CM | POA: Diagnosis not present

## 2024-04-23 MED ORDER — ALPRAZOLAM 1 MG PO TABS: 0.5000 mg | ORAL_TABLET | Freq: Every day | ORAL | 0 refills | Status: AC

## 2024-04-23 MED ORDER — OXYCODONE HCL 5 MG PO TABS: 5.0000 mg | ORAL_TABLET | Freq: Four times a day (QID) | ORAL | 0 refills | Status: DC | PRN

## 2024-04-23 MED ORDER — MIDODRINE HCL 10 MG PO TABS: 10.0000 mg | ORAL_TABLET | Freq: Three times a day (TID) | ORAL | Status: DC

## 2024-04-23 MED ORDER — GABAPENTIN 100 MG PO CAPS: 200.0000 mg | ORAL_CAPSULE | Freq: Every day | ORAL | 0 refills | Status: DC

## 2024-04-23 MED ORDER — ATORVASTATIN CALCIUM 80 MG PO TABS: 80.0000 mg | ORAL_TABLET | Freq: Every day | ORAL | Status: DC

## 2024-04-23 MED ORDER — ASPIRIN 325 MG PO TBEC: 325.0000 mg | DELAYED_RELEASE_TABLET | Freq: Every day | ORAL | Status: DC

## 2024-04-23 NOTE — Progress Notes (Signed)
 Reviewed with pt IS, sternal precautions, walking, and CRPII. Pt receptive and could voice correct care. Gave [facility]HS booklet. 750 ml IS.  [phone] Aliene Aris BS, ACSM-CEP 04/23/2024 10:26 AM

## 2024-04-23 NOTE — TOC Transition Note (Signed)
 Transition of Care [facility]) - Discharge Note   Patient Details  Name: Jesse [name] MRN: [id number] Date of Birth: 02/14/1941  Transition of Care [facility]) CM/SW Contact:  Montie LOISE Louder, LCSW Phone Number: 04/23/2024, 10:32 AM   Clinical Narrative:     Patient will Discharge to: [facility]  Discharge Date: 04/23/24 Family Notified: sent message to niece,Amy Transport Ab:EUJM  Per MD patient is ready for discharge. RN, patient, and facility notified of discharge. Discharge Summary sent to facility. RN given number for [phone]. Ambulance transport requested for patient.   Clinical Social Worker signing off.  Montie Louder, MSW, LCSW Clinical Social Worker     Final next level of care: Skilled Nursing Facility Barriers to Discharge: Barriers Resolved   Patient Goals and CMS Choice Patient states their goals for this hospitalization and ongoing recovery are:: wants to get better and go home CMS Medicare.gov Compare Post Acute Care list provided to:: Patient Choice offered to / list presented to : Patient      Discharge Placement              Patient chooses bed at:  [facility]) Patient to be transferred to facility by: [facility] Name of family member notified: niece, Amy Patient and family notified of of transfer: 04/23/24  Discharge Plan and Services Additional resources added to the After Visit Summary for     Discharge Planning Services: CM Consult Post Acute Care Choice: Home Health                               Social Drivers of Health (SDOH) Interventions SDOH Screenings   Food Insecurity: No Food Insecurity (04/10/2024)  Housing: Low Risk  (04/10/2024)  Transportation Needs: No Transportation Needs (04/10/2024)  Utilities: Not At Risk (04/10/2024)  Depression (PHQ2-9): Low Risk  (04/06/2024)  Social Connections: Socially Isolated (04/11/2024)  Tobacco Use: Low Risk  (04/11/2024)     Readmission Risk Interventions     No data to  display

## 2024-04-23 NOTE — Progress Notes (Signed)
 Mobility Specialist Progress Note:   04/23/24 0924  Orthostatic Sitting  BP- Sitting 114/74 (86)  Pulse- Sitting 108  Orthostatic Standing at 0 minutes  BP- Standing at 0 minutes (!) 88/68 (75)  Pulse- Standing at 0 minutes 128  Orthostatic Standing at 3 minutes  BP- Standing at 3 minutes 127/79 (94)  Pulse- Standing at 3 minutes 135  Mobility  Activity Ambulated with assistance  Level of Assistance Minimal assist, patient does 75% or more  Assistive Device Front wheel walker  Distance Ambulated (ft) 200 ft  RUE Weight Bearing Per Provider Order NWB  LUE Weight Bearing Per Provider Order NWB  Activity Response Tolerated well  Mobility Referral Yes  Mobility visit 1 Mobility  Mobility Specialist Start Time (ACUTE ONLY) [id number]  Mobility Specialist Stop Time (ACUTE ONLY) 0940  Mobility Specialist Time Calculation (min) (ACUTE ONLY) 16 min   Pt received in chair, agreeable to mobility session. Ambulated in hallway and monitored BP throughout. Tolerated well, asx throughout. Returned pt to room, lying in bed comfortably with all needs met. BP supine, post mobility: 137/91 (106), HR 105 bpm.   Emelee Rodocker Mobility Specialist Please contact via SecureChat or  Rehab office at [phone]

## 2024-04-23 NOTE — Progress Notes (Addendum)
 [address] [employer] [address]             [phone]      12 Days Post-Op Procedure(s) (LRB): CORONARY ARTERY BYPASS GRAFTING (CABG) X THREE, USING LEFT INTERNAL MAMMARY ARTERY AND RIGHT LEG GREATER SAPHENOUS VEIN HARVESTED ENDOSCOPICALLY (N/A) ECHOCARDIOGRAM, TRANSESOPHAGEAL, INTRAOPERATIVE Subjective: Patient reports he walked well yesterday and denied dizziness or lightheadedness yesterday  Objective: Vital signs in last 24 hours: Temp:  [97.3 F (36.3 C)-98.4 F (36.9 C)] 97.8 F (36.6 C) (08/01 0255) Pulse Rate:  [66-102] 102 (08/01 0255) Cardiac Rhythm: Normal sinus rhythm (07/31 1900) Resp:  [20] 20 (08/01 0255) BP: (113-130)/(72-105) 117/105 (08/01 0255) SpO2:  [96 %-100 %] 100 % (08/01 0255) Weight:  [61.1 kg] 61.1 kg (08/01 0500)  Hemodynamic parameters for last 24 hours:    Intake/Output from previous day: 07/31 0701 - 08/01 0700 In: -  Out: 200 [Urine:200] Intake/Output this shift: No intake/output data recorded.  General appearance: alert, cooperative, and no distress Neurologic: intact Heart: regular rate and rhythm, soft systolic murmur, no rubs Lungs: clear to auscultation bilaterally Abdomen: soft, non-tender; bowel sounds normal; no masses,  no organomegaly Extremities: extremities normal, atraumatic, no cyanosis or edema Wound: Clean and dry without sign of infection  Lab Results: No results for input(s): WBC, HGB, HCT, PLT in the last 72 hours. BMET:  Recent Labs    04/22/24 0930  NA 130*  K 3.4*  CL 101  CO2 22  GLUCOSE 131*  BUN 11  CREATININE 0.71  CALCIUM  9.1    PT/INR: No results for input(s): LABPROT, INR in the last 72 hours. ABG    Component Value Date/Time   PHART 7.374 04/11/2024 2217   HCO3 19.9 (L) 04/11/2024 2217   TCO2 21 (L) 04/11/2024 2217   ACIDBASEDEF 5.0 (H) 04/11/2024 2217   O2SAT 98 04/11/2024 2217   CBG (last 3)  Recent Labs    04/22/24 1119  GLUCAP 124*     Assessment/Plan: S/P Procedure(s) (LRB): CORONARY ARTERY BYPASS GRAFTING (CABG) X THREE, USING LEFT INTERNAL MAMMARY ARTERY AND RIGHT LEG GREATER SAPHENOUS VEIN HARVESTED ENDOSCOPICALLY (N/A) ECHOCARDIOGRAM, TRANSESOPHAGEAL, INTRAOPERATIVE  Neuro: Patient A&Ox4 but PT reports memory deficits and recommends SLP cognitive evaluation. As discussed with surgeon not needed while inpatient as he is neurologically intact. Patient will need close outpatient follow up with PCP for dementia workup.    CV: S/P NSTEMI, risks outweigh benefits of Plavix. Continue ASA 325mg  daily. Orthostatic hypotension improving, SBP 115-130 this AM on Midodrine  10mg  TID. No dizziness or lightheadedness yesterday. Holding BB due to hypotension and orthostasis. NSR this AM, HR 80s this AM. Bursts of irregular rhythm likely PACs much less frequent and controlled rate for the most part.    Pulm: Saturating well on RA. Last CXR 07/29 with improved left basilar atelectasis and improved small pleural effusion. Encourage IS and ambulation   GI: +BM yesterday yesterday, tolerating diet. Continue bowel regimen. Increase diet.    Renal: Cr 0.71, stable. Mild hyponatremia, Na 130 yesterday slightly improved with increase PO intake. Possibly dehydrated, continue to encourage PO fluid intake. K 3.4 yesterday, supplemented. No UO recorded. Under preop weight. No diuresis. Continue Flomax .    DVT Prophylaxis: Lovenox    Deconditioning: Patient now agreeable to SNF, continue to work with PT/OT. Ambulated 364ft with mobility specialist yesterday without orthostatic hypotension.    Dispo: Plan to d/c to SNF once bed is available   LOS:  13 days    Con GORMAN Bend, PA-C 04/23/2024

## 2024-04-23 NOTE — Progress Notes (Incomplete)
 Mobility Specialist Progress Note:   04/23/24 0924  Mobility  Activity Ambulated with assistance  Level of Assistance Minimal assist, patient does 75% or more  Assistive Device Front wheel walker  Distance Ambulated (ft) 200 ft  RUE Weight Bearing Per Provider Order NWB  LUE Weight Bearing Per Provider Order NWB  Activity Response Tolerated well  Mobility Referral Yes  Mobility visit 1 Mobility  Mobility Specialist Start Time (ACUTE ONLY) [id number]  Mobility Specialist Stop Time (ACUTE ONLY) 0940  Mobility Specialist Time Calculation (min) (ACUTE ONLY) 16 min     Pre Mobility: 114/74 (86) During Mobility: Post Mobility:  Jesse [name] Mobility Specialist Please contact via Special educational needs teacher or  Rehab office at [phone]

## 2024-04-23 NOTE — Progress Notes (Signed)
 Order received to discharge patient.  Report called to receiving RN, Corean, at [facility].  Telemetry monitor removed and CCMD notified.  PIV access removed without difficulty. All DC paperwork sent with [facility]AR.

## 2024-04-23 NOTE — Progress Notes (Signed)
 Completed AVS for discharge packet; printed and placed with chart.  Patient going to [facility] for rehab; Waiting for Ins Auth.

## 2024-04-26 DIAGNOSIS — M4302 Spondylolysis, cervical region: Secondary | ICD-10-CM | POA: Diagnosis not present

## 2024-04-26 DIAGNOSIS — I214 Non-ST elevation (NSTEMI) myocardial infarction: Secondary | ICD-10-CM | POA: Diagnosis not present

## 2024-04-26 DIAGNOSIS — I951 Orthostatic hypotension: Secondary | ICD-10-CM | POA: Diagnosis not present

## 2024-04-26 DIAGNOSIS — E782 Mixed hyperlipidemia: Secondary | ICD-10-CM | POA: Diagnosis not present

## 2024-04-26 NOTE — Progress Notes (Signed)
 Cardiology Office Note   Date:  05/03/2024  ID:  Jesse, [name] [birth year], MRN [id number] PCP: Ransom Other, MD  [facility] Providers Cardiologist:  Gordy Bergamo, MD    History of Present Illness Jesse [name] is a 83 y.o. male with a past medical history of hypertension, hyperlipidemia, depression, neuritis, polycythemia vera with phlebotomy last on 7/15 who was recently seen for STEMI.  He is now here for follow-up appointment.  He was having chest pain the night of 7/18 and started after he ate.  Took an aspirin  and it resolved.  Early in the morning 7/19 chest pain began again after eating.  Called EMS.  Was given 4 baby aspirin  to chew and sublingual nitroglycerin  x 2 by EMS.  Chest pain improved at that time.  Pain was associated with some diaphoresis and some shortness of breath.  Felt a little queasy but was not nauseous.  EKG was significantly abnormal.  Brought to the emergency room by EMS and taken to the Cath Lab for further evaluation.  Cardiac catheterization was then performed which revealed extremely complex anatomy, heavily calcified left main and ostium of the circumflex with heavy calcified RCA, extremely high risk for PCI.  Patient was then referred to CTS for CABG.  Patient was then taken to the OR on 04/11/2024 and underwent CABG x 3 utilizing LIMA to LAD, SVG to OM, and SVG to PDA.  Underwent endoscopic harvest of the greater saphenous vein from his right leg.  He tolerated the procedure well and was transferred to the ICU.  Evaluations were obtained and recommended SNF but patient refused and preferred home health PT/OT.  Developed some tachycardia with PACs.  But overall had an uncomplicated stay.  Was discharged 04/23/2024.  Today, he presents with coronary artery disease s/p CABG for post-operative follow-up.  He underwent coronary artery bypass grafting with three bypasses approximately three weeks ago and was discharged on April 23, 2024. Post-surgery, he  experienced chest tightness, which has resolved. He occasionally experiences shortness of breath and swelling in the right ankle, where the vein was harvested.  He has polycythemia vera and underwent his last phlebotomy on April 06, 2024. His current medications include aspirin  325 mg, atorvastatin , gabapentin  200 mg at bedtime, midodrine  10 mg three times a day, a pain medication as needed, tamsulosin , and vitamin B12. No recent chest pain, lightheadedness, or dizziness.  Reports no shortness of breath nor dyspnea on exertion. No orthopnea, PND.   Discussed the use of AI scribe software for clinical note transcription with the patient, who gave verbal consent to proceed.   ROS: pertinent ROS in HPI  Studies Reviewed      Cardiac Catheterization 04/10/24: Hemodynamic data: LV 109/-10, EDP 11 mmHg.  Ao 125/66, mean 76 mmHg.  No pressure gradient across the aortic valve.   Angiographic data: LM: Distal left main has calcific eccentric 99% stenosis. LAD: Flush occluded and reconstitutes at the mid to distal level via collaterals from CX.  No large diagonals visualized. LCx: Ostium calcified with a 40% stenosis.  Large OM 2 and a moderate to large OM 3 with a focal 90% stenosis.  Collaterals given to LAD.  TIMI-3 flow. RCA: Very large caliber vessel, diffusely diseased and heavily calcified.  Proximal segment is occluded with bridging collaterals and there is a tandem mid segment calcific 90% stenosis with probable mild thrombus and distal RCA has a 60% focal stenosis.  TIMI II flow. LV: Normal LV systolic function.  No  wall motion abnormality.  No significant mitral regurgitation. Left and right subclavian and IMA: Widely patent.      Impression and recommendations: Extremely complex anatomy, heavily calcified left main and ostium of CX and heavily calcified RCA, extreme high risk for PCI.  Patient is EKG normalized, patient remained chest pain-free, hemodynamics are stable and LVEF is  normal.  Hence decision made to take the patient off the table, start him on Aggrastat  and continue IV heparin  once TR band is off and surgical consultation will be obtained for CABG.      Physical Exam VS:  BP 110/68 (BP Location: Right Arm, Patient Position: Sitting, Cuff Size: Normal)   Pulse (!) 112   Ht 5' 5 (1.651 m)   Wt 140 lb 3.2 oz (63.6 kg)   SpO2 95%   BMI 23.33 kg/m        Wt Readings from Last 3 Encounters:  05/03/24 140 lb 3.2 oz (63.6 kg)  04/23/24 134 lb 11.2 oz (61.1 kg)  04/06/24 146 lb (66.2 kg)    GEN: Well nourished, well developed in no acute distress NECK: No JVD; No carotid bruits CARDIAC: sinus tach, no murmurs, rubs, gallops RESPIRATORY:  Clear to auscultation without rales, wheezing or rhonchi  ABDOMEN: Soft, non-tender, non-distended EXTREMITIES:  No edema; No deformity   ASSESSMENT AND PLAN  Status post coronary artery bypass grafting with postoperative tachycardia and right lower extremity swelling Postoperative tachycardia. Right lower extremity swelling attributed to vein harvesting, expected to decrease. Intermittent chest tightness resolved. - Order EKG to evaluate heart rhythm and rule out arrhythmias. - Discontinue lisinopril  HCTZ  - Continue aspirin  325 mg until surgical follow-up, then reassess dosage. - Continue atorvastatin , gabapentin , midodrine , tamsulosin , and vitamin B12 as per current regimen. - Advise wearing compression hose during the day and elevate legs to manage swelling. - Consider low-dose beta blocker like metoprolol  if heart rate remains elevated and blood pressure allows.  Postoperative weakness Significant weakness reported post-surgery. - Recommend cardiac rehabilitation pending surgical clearance.  Hypertension Hypertension management complicated by need to maintain adequate blood pressure post-surgery. - Monitor blood pressure closely, especially with metoprolol  initiation   Polycythemia vera Managed with  phlebotomy, last performed on April 06, 2024.     Cardiac Rehabilitation Eligibility Assessment  The patient is ready to start cardiac rehabilitation pending clearance from the cardiac surgeon.     Dispo: He can follow-up in 3 months with Dr. Ladona  Signed, Orren LOISE Fabry, PA-C

## 2024-04-27 DIAGNOSIS — M6281 Muscle weakness (generalized): Secondary | ICD-10-CM | POA: Diagnosis not present

## 2024-04-27 DIAGNOSIS — I214 Non-ST elevation (NSTEMI) myocardial infarction: Secondary | ICD-10-CM | POA: Diagnosis not present

## 2024-04-27 DIAGNOSIS — R2689 Other abnormalities of gait and mobility: Secondary | ICD-10-CM | POA: Diagnosis not present

## 2024-04-30 ENCOUNTER — Ambulatory Visit
Payer: Self-pay | Attending: Thoracic Surgery (Cardiothoracic Vascular Surgery) | Admitting: Thoracic Surgery (Cardiothoracic Vascular Surgery)

## 2024-04-30 DIAGNOSIS — I214 Non-ST elevation (NSTEMI) myocardial infarction: Secondary | ICD-10-CM | POA: Diagnosis not present

## 2024-04-30 DIAGNOSIS — E782 Mixed hyperlipidemia: Secondary | ICD-10-CM | POA: Diagnosis not present

## 2024-04-30 DIAGNOSIS — I951 Orthostatic hypotension: Secondary | ICD-10-CM | POA: Diagnosis not present

## 2024-04-30 DIAGNOSIS — M4302 Spondylolysis, cervical region: Secondary | ICD-10-CM | POA: Diagnosis not present

## 2024-04-30 DIAGNOSIS — D45 Polycythemia vera: Secondary | ICD-10-CM | POA: Diagnosis not present

## 2024-05-03 ENCOUNTER — Telehealth: Payer: Self-pay | Admitting: Cardiology

## 2024-05-03 ENCOUNTER — Encounter: Payer: Self-pay | Admitting: Physician Assistant

## 2024-05-03 ENCOUNTER — Ambulatory Visit: Attending: Physician Assistant | Admitting: Physician Assistant

## 2024-05-03 VITALS — BP 110/68 | HR 112 | Ht 65.0 in | Wt 140.2 lb

## 2024-05-03 DIAGNOSIS — I214 Non-ST elevation (NSTEMI) myocardial infarction: Secondary | ICD-10-CM

## 2024-05-03 DIAGNOSIS — E785 Hyperlipidemia, unspecified: Secondary | ICD-10-CM

## 2024-05-03 DIAGNOSIS — D45 Polycythemia vera: Secondary | ICD-10-CM | POA: Diagnosis not present

## 2024-05-03 DIAGNOSIS — R000 Tachycardia, unspecified: Secondary | ICD-10-CM

## 2024-05-03 DIAGNOSIS — I10 Essential (primary) hypertension: Secondary | ICD-10-CM | POA: Diagnosis not present

## 2024-05-03 MED ORDER — METOPROLOL TARTRATE 25 MG PO TABS: 25.0000 mg | ORAL_TABLET | Freq: Two times a day (BID) | ORAL | 3 refills | Status: DC

## 2024-05-03 NOTE — Patient Instructions (Signed)
 Medication Instructions:  Your physician has recommended you make the following change in your medication: Start Metoprolol  12.mg, 2 times per day. *If you need a refill on your cardiac medications before your next appointment, please call your pharmacy*  Lab Work: NONE  If you have labs (blood work) drawn today and your tests are completely normal, you will receive your results only by: MyChart Message (if you have MyChart) OR A paper copy in the mail If you have any lab test that is abnormal or we need to change your treatment, we will call you to review the results.  Testing/Procedures: NONE  Follow-Up: At [facility], you and your health needs are our priority.  As part of our continuing mission to provide you with exceptional heart care, our providers are all part of one team.  This team includes your primary Cardiologist (physician) and Advanced Practice Providers or APPs (Physician Assistants and Nurse Practitioners) who all work together to provide you with the care you need, when you need it.  Your next appointment:   3 month(s)  Provider:   Gordy Bergamo, MD   Other Instructions Call if systolic less than 105.

## 2024-05-03 NOTE — Telephone Encounter (Signed)
 Informed Jesse [name] our office does not typically order bedside commodes and bathtub bench. Advised him to contact patient's PCP to request order for these.  Jesse [name] states home health nurse advised him to call our office since he saw a provider today.   Will forward message to [facility] to see if agreeable to ordering equipment.

## 2024-05-03 NOTE — Telephone Encounter (Signed)
 Tessa agreeable to ordering bedside commode and tub transfer bench for patient.  DME order placed and faxed with last office visit note to [employer] at [phone].

## 2024-05-03 NOTE — Telephone Encounter (Signed)
 Jesse [name] is calling because the patient had the home health nurse come by today. Jesse stated the nurse suggested the patient order a few items that are covered by insurance. Jesse is requesting we order the patient a bed side toilet and a  bathtub bench. Please advise.

## 2024-05-05 DIAGNOSIS — I10 Essential (primary) hypertension: Secondary | ICD-10-CM | POA: Diagnosis not present

## 2024-05-11 DIAGNOSIS — I214 Non-ST elevation (NSTEMI) myocardial infarction: Secondary | ICD-10-CM | POA: Diagnosis not present

## 2024-05-11 DIAGNOSIS — Z951 Presence of aortocoronary bypass graft: Secondary | ICD-10-CM | POA: Diagnosis not present

## 2024-05-11 DIAGNOSIS — I25810 Atherosclerosis of coronary artery bypass graft(s) without angina pectoris: Secondary | ICD-10-CM | POA: Diagnosis not present

## 2024-05-11 DIAGNOSIS — I951 Orthostatic hypotension: Secondary | ICD-10-CM | POA: Diagnosis not present

## 2024-05-22 ENCOUNTER — Emergency Department (HOSPITAL_COMMUNITY)

## 2024-05-22 ENCOUNTER — Other Ambulatory Visit: Payer: Self-pay

## 2024-05-22 ENCOUNTER — Emergency Department (HOSPITAL_COMMUNITY)
Admission: EM | Admit: 2024-05-22 | Discharge: 2024-05-22 | Disposition: A | Discharge status: Home / Self Care | Attending: Emergency Medicine | Admitting: Emergency Medicine

## 2024-05-22 DIAGNOSIS — M542 Cervicalgia: Secondary | ICD-10-CM | POA: Diagnosis present

## 2024-05-22 DIAGNOSIS — Z7982 Long term (current) use of aspirin: Secondary | ICD-10-CM | POA: Diagnosis not present

## 2024-05-22 DIAGNOSIS — R531 Weakness: Secondary | ICD-10-CM | POA: Diagnosis not present

## 2024-05-22 DIAGNOSIS — W19XXXA Unspecified fall, initial encounter: Secondary | ICD-10-CM | POA: Diagnosis not present

## 2024-05-22 DIAGNOSIS — W01198A Fall on same level from slipping, tripping and stumbling with subsequent striking against other object, initial encounter: Secondary | ICD-10-CM | POA: Diagnosis not present

## 2024-05-22 DIAGNOSIS — S0083XA Contusion of other part of head, initial encounter: Secondary | ICD-10-CM

## 2024-05-22 DIAGNOSIS — S0121XA Laceration without foreign body of nose, initial encounter: Secondary | ICD-10-CM | POA: Insufficient documentation

## 2024-05-22 DIAGNOSIS — S0990XA Unspecified injury of head, initial encounter: Secondary | ICD-10-CM | POA: Diagnosis not present

## 2024-05-22 DIAGNOSIS — S199XXA Unspecified injury of neck, initial encounter: Secondary | ICD-10-CM | POA: Diagnosis not present

## 2024-05-22 DIAGNOSIS — S0992XA Unspecified injury of nose, initial encounter: Secondary | ICD-10-CM | POA: Diagnosis present

## 2024-05-22 DIAGNOSIS — S0993XA Unspecified injury of face, initial encounter: Secondary | ICD-10-CM | POA: Diagnosis not present

## 2024-05-22 DIAGNOSIS — J342 Deviated nasal septum: Secondary | ICD-10-CM | POA: Diagnosis not present

## 2024-05-22 NOTE — ED Notes (Signed)
 Bandaged nose and assisted patient to get dressed, placed in vehicle with nephew to go home. D/C education given. Pt verbalized understanding.

## 2024-05-22 NOTE — ED Triage Notes (Signed)
 Patient via EMS from home for mechanical fall. Hit face and has abrasion and deformity to nose. A/O x 4. No LOC, no blood thinners, denies dizziness preceding fall.

## 2024-05-22 NOTE — ED Provider Notes (Signed)
 [place] EMERGENCY DEPARTMENT AT [facility] Provider Note   CSN: [id number] Arrival date & time: 05/22/24  1329     Patient presents with: Jesse   ROMANO [name] is a 83 y.o. male.   Patient complains of falling and hitting his face.  Patient states he was wearing slippery socks and stumbled with his walker and fell hitting his face.  Patient states he did not lose consciousness.  Patient complains of a cut to his nose and pain in his nose.  He reports he does have some soreness in his neck.  Patient denies any dizziness he denies any visual change he has not had any hearing change.  Patient states EMS was able to help him get up.  Patient denies any arm pain.  He denies any pain in his legs.  Patient does not have any chest or abdominal pain.  The history is provided by the patient. No language interpreter was used.  Fall       Prior to Admission medications   Medication Sig Start Date End Date Taking? Authorizing Provider  ALPRAZolam  (XANAX ) 1 MG tablet Take 0.5 tablets (0.5 mg total) by mouth at bedtime. 04/23/24   Raguel Con RAMAN, PA-C  aspirin  EC 325 MG tablet Take 1 tablet (325 mg total) by mouth daily. 04/23/24   Raguel Con RAMAN, PA-C  atorvastatin  (LIPITOR ) 80 MG tablet Take 1 tablet (80 mg total) by mouth daily. 04/23/24   Raguel Con RAMAN, PA-C  Cholecalciferol  50 MCG (2000 UT) CAPS Take 2,000 Units by mouth daily.    [provider]  fish oil-omega-3 fatty acids 1000 MG capsule Take 1 g by mouth every other day.    [provider]  gabapentin  (NEURONTIN ) 100 MG capsule Take 2 capsules (200 mg total) by mouth at bedtime. 04/23/24 05/23/24  Raguel Con RAMAN, PA-C  metoprolol  tartrate (LOPRESSOR ) 25 MG tablet Take 1 tablet (25 mg total) by mouth 2 (two) times daily. 05/03/24 08/01/24  Lucien Orren SAILOR, PA-C  midodrine  (PROAMATINE ) 10 MG tablet Take 1 tablet (10 mg total) by mouth 3 (three) times daily with meals. 04/23/24   Raguel Con RAMAN, PA-C   oxyCODONE  (OXY IR/ROXICODONE ) 5 MG immediate release tablet Take 1 tablet (5 mg total) by mouth every 6 (six) hours as needed for severe pain (pain score 7-10). 04/23/24   Raguel Con RAMAN, PA-C  tamsulosin  (FLOMAX ) 0.4 MG CAPS capsule Take 0.4 mg by mouth daily. 03/26/21   [provider]  vitamin B-12 (CYANOCOBALAMIN ) 100 MCG tablet Take 100 mcg by mouth daily.    [provider]    Allergies: Hctz [hydrochlorothiazide ] and Tramadol    Review of Systems  All other systems reviewed and are negative.   Updated Vital Signs BP 121/69   Pulse 73   Temp 98.3 F (36.8 C) (Oral)   Resp 17   SpO2 100%   Physical Exam Vitals and nursing note reviewed.  Constitutional:      Appearance: He is well-developed.  HENT:     Head: Normocephalic.     Nose:     Comments: Abrasion/laceration to nose    Mouth/Throat:     Mouth: Mucous membranes are moist.  Eyes:     Extraocular Movements: Extraocular movements intact.     Conjunctiva/sclera: Conjunctivae normal.     Pupils: Pupils are equal, round, and reactive to light.  Cardiovascular:     Rate and Rhythm: Normal rate.  Pulmonary:     Effort: Pulmonary  effort is normal.  Abdominal:     General: There is no distension.  Musculoskeletal:        General: Normal range of motion.     Cervical back: Normal range of motion.  Skin:    General: Skin is warm.  Neurological:     General: No focal deficit present.     Mental Status: He is alert and oriented to person, place, and time.  Psychiatric:        Mood and Affect: Mood normal.     (all labs ordered are listed, but only abnormal results are displayed) Labs Reviewed - No data to display  EKG: None  Radiology: CT Head Wo Contrast Result Date: 05/22/2024 CLINICAL DATA:  Head trauma, moderate-severe; Facial trauma, blunt EXAM: CT HEAD WITHOUT CONTRAST CT MAXILLOFACIAL WITHOUT CONTRAST CT CERVICAL SPINE WITHOUT CONTRAST TECHNIQUE: Multidetector CT imaging of the  head, cervical spine, and maxillofacial structures were performed using the standard protocol without intravenous contrast. Multiplanar CT image reconstructions of the cervical spine and maxillofacial structures were also generated. RADIATION DOSE REDUCTION: This exam was performed according to the departmental dose-optimization program which includes automated exposure control, adjustment of the mA and/or kV according to patient size and/or use of iterative reconstruction technique. COMPARISON:  None Available. FINDINGS: CT HEAD FINDINGS Brain: No evidence of acute infarction, hemorrhage, hydrocephalus, extra-axial collection or mass lesion/mass effect. Vascular: Calcific atherosclerosis.  No hyperdense vessel. Skull: No acute fracture. Other: No mastoid effusions. CT MAXILLOFACIAL FINDINGS Osseous: No fracture or mandibular dislocation. No destructive process. Orbits: Negative. No traumatic or inflammatory finding. Sinuses: Clear.  Leftward nasal septal deviation with bony spur. Soft tissues: Negative. CT CERVICAL SPINE FINDINGS Alignment: Normal. Skull base and vertebrae: No evidence of acute fracture. Soft tissues and spinal canal: No prevertebral fluid or swelling. No visible canal hematoma. Disc levels: Moderate to severe multilevel degenerative disc disease with disc height loss, endplate sclerosis and endplate spurring. Varying degrees of neural foraminal stenosis. Upper chest: Lung apices are clear. IMPRESSION: 1. No evidence of acute intracranial abnormality. No acute facial fracture. 2. No evidence of acute fracture or traumatic malalignment in the cervical spine. Electronically Signed   By: Gilmore GORMAN Molt M.D.   On: 05/22/2024 15:44   CT Maxillofacial Wo Contrast Result Date: 05/22/2024 CLINICAL DATA:  Head trauma, moderate-severe; Facial trauma, blunt EXAM: CT HEAD WITHOUT CONTRAST CT MAXILLOFACIAL WITHOUT CONTRAST CT CERVICAL SPINE WITHOUT CONTRAST TECHNIQUE: Multidetector CT imaging of the head,  cervical spine, and maxillofacial structures were performed using the standard protocol without intravenous contrast. Multiplanar CT image reconstructions of the cervical spine and maxillofacial structures were also generated. RADIATION DOSE REDUCTION: This exam was performed according to the departmental dose-optimization program which includes automated exposure control, adjustment of the mA and/or kV according to patient size and/or use of iterative reconstruction technique. COMPARISON:  None Available. FINDINGS: CT HEAD FINDINGS Brain: No evidence of acute infarction, hemorrhage, hydrocephalus, extra-axial collection or mass lesion/mass effect. Vascular: Calcific atherosclerosis.  No hyperdense vessel. Skull: No acute fracture. Other: No mastoid effusions. CT MAXILLOFACIAL FINDINGS Osseous: No fracture or mandibular dislocation. No destructive process. Orbits: Negative. No traumatic or inflammatory finding. Sinuses: Clear.  Leftward nasal septal deviation with bony spur. Soft tissues: Negative. CT CERVICAL SPINE FINDINGS Alignment: Normal. Skull base and vertebrae: No evidence of acute fracture. Soft tissues and spinal canal: No prevertebral fluid or swelling. No visible canal hematoma. Disc levels: Moderate to severe multilevel degenerative disc disease with disc height loss, endplate sclerosis and endplate spurring.  Varying degrees of neural foraminal stenosis. Upper chest: Lung apices are clear. IMPRESSION: 1. No evidence of acute intracranial abnormality. No acute facial fracture. 2. No evidence of acute fracture or traumatic malalignment in the cervical spine. Electronically Signed   By: Gilmore GORMAN Molt M.D.   On: 05/22/2024 15:44   CT Cervical Spine Wo Contrast Result Date: 05/22/2024 CLINICAL DATA:  Head trauma, moderate-severe; Facial trauma, blunt EXAM: CT HEAD WITHOUT CONTRAST CT MAXILLOFACIAL WITHOUT CONTRAST CT CERVICAL SPINE WITHOUT CONTRAST TECHNIQUE: Multidetector CT imaging of the head,  cervical spine, and maxillofacial structures were performed using the standard protocol without intravenous contrast. Multiplanar CT image reconstructions of the cervical spine and maxillofacial structures were also generated. RADIATION DOSE REDUCTION: This exam was performed according to the departmental dose-optimization program which includes automated exposure control, adjustment of the mA and/or kV according to patient size and/or use of iterative reconstruction technique. COMPARISON:  None Available. FINDINGS: CT HEAD FINDINGS Brain: No evidence of acute infarction, hemorrhage, hydrocephalus, extra-axial collection or mass lesion/mass effect. Vascular: Calcific atherosclerosis.  No hyperdense vessel. Skull: No acute fracture. Other: No mastoid effusions. CT MAXILLOFACIAL FINDINGS Osseous: No fracture or mandibular dislocation. No destructive process. Orbits: Negative. No traumatic or inflammatory finding. Sinuses: Clear.  Leftward nasal septal deviation with bony spur. Soft tissues: Negative. CT CERVICAL SPINE FINDINGS Alignment: Normal. Skull base and vertebrae: No evidence of acute fracture. Soft tissues and spinal canal: No prevertebral fluid or swelling. No visible canal hematoma. Disc levels: Moderate to severe multilevel degenerative disc disease with disc height loss, endplate sclerosis and endplate spurring. Varying degrees of neural foraminal stenosis. Upper chest: Lung apices are clear. IMPRESSION: 1. No evidence of acute intracranial abnormality. No acute facial fracture. 2. No evidence of acute fracture or traumatic malalignment in the cervical spine. Electronically Signed   By: Gilmore GORMAN Molt M.D.   On: 05/22/2024 15:44     .Laceration Repair  Date/Time: 05/22/2024 8:03 PM  Performed by: Flint Sonny POUR, PA-C Authorized by: Flint Sonny POUR, PA-C   Consent:    Consent obtained:  Verbal   Consent given by:  Patient   Risks discussed:  Infection   Alternatives discussed:  No  treatment Universal protocol:    Procedure explained and questions answered to patient or proxy's satisfaction: yes     Immediately prior to procedure, a time out was called: yes     Patient identity confirmed:  Verbally with patient Laceration details:    Length (cm):  1 Pre-procedure details:    Preparation:  Patient was prepped and draped in usual sterile fashion Treatment:    Area cleansed with:  Shur-Clens   Irrigation solution:  Sterile saline Skin repair:    Repair method:  Tissue adhesive Repair type:    Repair type:  Simple Post-procedure details:    Dressing:  Open (no dressing)    Medications Ordered in the ED - No data to display                                  Medical Decision Making Patient reports he slipped and fell striking his nose.  He complains of pain in his nose and some soreness in his neck  Amount and/or Complexity of Data Reviewed Independent Historian: EMS    Details: Patient was brought here by EMS.  Patient states that he lives with family. Radiology: ordered and independent interpretation performed. Decision-making details documented in ED Course.  Details: CT head shows no acute findings CT maxillofacial no nasal fracture, CT cervical spine no acute findings  Risk Risk Details: Laceration to nose is abrasions/skin tear.  Dermabond applied        Final diagnoses:  Fall, initial encounter  Laceration of nose, initial encounter  Contusion of face, initial encounter    ED Discharge Orders     None          Flint Sonny MARLA DEVONNA 05/22/24 2004    Geraldene Hamilton, MD 05/23/24 1121

## 2024-05-22 NOTE — Discharge Instructions (Signed)
 Return if any problems.

## 2024-05-23 DIAGNOSIS — E7800 Pure hypercholesterolemia, unspecified: Secondary | ICD-10-CM | POA: Diagnosis not present

## 2024-05-23 DIAGNOSIS — I10 Essential (primary) hypertension: Secondary | ICD-10-CM | POA: Diagnosis not present

## 2024-05-26 ENCOUNTER — Other Ambulatory Visit: Payer: Self-pay | Admitting: Orthopedic Surgery

## 2024-05-28 DIAGNOSIS — E538 Deficiency of other specified B group vitamins: Secondary | ICD-10-CM | POA: Diagnosis not present

## 2024-05-28 DIAGNOSIS — E871 Hypo-osmolality and hyponatremia: Secondary | ICD-10-CM | POA: Diagnosis not present

## 2024-05-28 DIAGNOSIS — D45 Polycythemia vera: Secondary | ICD-10-CM | POA: Diagnosis not present

## 2024-05-28 DIAGNOSIS — I700 Atherosclerosis of aorta: Secondary | ICD-10-CM | POA: Diagnosis not present

## 2024-05-28 DIAGNOSIS — Z23 Encounter for immunization: Secondary | ICD-10-CM | POA: Diagnosis not present

## 2024-05-28 DIAGNOSIS — E559 Vitamin D deficiency, unspecified: Secondary | ICD-10-CM | POA: Diagnosis not present

## 2024-05-28 DIAGNOSIS — Z0000 Encounter for general adult medical examination without abnormal findings: Secondary | ICD-10-CM | POA: Diagnosis not present

## 2024-05-28 DIAGNOSIS — I951 Orthostatic hypotension: Secondary | ICD-10-CM | POA: Diagnosis not present

## 2024-05-28 DIAGNOSIS — Z951 Presence of aortocoronary bypass graft: Secondary | ICD-10-CM | POA: Diagnosis not present

## 2024-05-28 DIAGNOSIS — I10 Essential (primary) hypertension: Secondary | ICD-10-CM | POA: Diagnosis not present

## 2024-05-28 DIAGNOSIS — E7800 Pure hypercholesterolemia, unspecified: Secondary | ICD-10-CM | POA: Diagnosis not present

## 2024-05-28 DIAGNOSIS — M519 Unspecified thoracic, thoracolumbar and lumbosacral intervertebral disc disorder: Secondary | ICD-10-CM | POA: Diagnosis not present

## 2024-06-03 ENCOUNTER — Other Ambulatory Visit: Payer: Self-pay | Admitting: Thoracic Surgery (Cardiothoracic Vascular Surgery)

## 2024-06-03 DIAGNOSIS — Z951 Presence of aortocoronary bypass graft: Secondary | ICD-10-CM

## 2024-06-03 NOTE — Progress Notes (Unsigned)
 [address] [place] [address]             [phone]       HPI: Jesse [name] is an 83 yo male with history of Polycythemia Vera, HTN, and HLD who returns for routine postoperative follow-up having undergone CABG X 3: LIMA to LAD, SBG to OM and SVG to PDA on 04/11/2024 with Dr. Shyrl.   The patient's early postoperative recovery while in the hospital was notable for being routinely diuresed postoperatively with lasix .  He was monitored closely due to chronic hyponatremia.  He had issues with orthostatic hypotension and midodrine  was started. He was deconditioned after surgery and was discharged to a SNF on 04/23/2024.   He presents today to the clinic with his niece. Since hospital discharge the patient reports that he has been doing ok.  He reports that his incision have healed well.  He does not have any pain at the surgical site and is not taking any pain medications.  His niece is concerned about his deconditioning.  He was seen in the emergency department on 05/22/2024 due to a fall. She reports that he is weak and has been having episodes of being off.  Patient states that when he feels off he is lightheaded.  He has not been checking his blood pressure at home.  Patient reports that he has been taking hydrochlorothiazide -lisinopril  daily.  This medication was discontinued by cardiology.  He denies chest pain, shortness of breath and lower leg swelling.   Allergies as of 06/04/2024       Reactions   Hctz [hydrochlorothiazide ] Other (See Comments)   Hyponatremia ; Na+ 111, w weakness resulting in mechanical fall   Tramadol Nausea And Vomiting        Medication List        Accurate as of June 04, 2024 11:46 AM. If you have any questions, ask your nurse or doctor.          ALPRAZolam  1 MG tablet Commonly known as: XANAX  Take 0.5 tablets (0.5 mg total) by mouth at bedtime.   aspirin  EC 325 MG tablet Take 1 tablet (325 mg total) by  mouth daily.   atorvastatin  80 MG tablet Commonly known as: LIPITOR  Take 1 tablet (80 mg total) by mouth daily.   Cholecalciferol  50 MCG (2000 UT) Caps Take 2,000 Units by mouth daily.   fish oil-omega-3 fatty acids 1000 MG capsule Take 1 g by mouth every other day.   gabapentin  100 MG capsule Commonly known as: NEURONTIN  Take 2 capsules (200 mg total) by mouth 3 (three) times daily.   metoprolol  tartrate 25 MG tablet Commonly known as: LOPRESSOR  Take 1 tablet (25 mg total) by mouth 2 (two) times daily.   midodrine  10 MG tablet Commonly known as: PROAMATINE  Take 1 tablet (10 mg total) by mouth 3 (three) times daily with meals.   oxyCODONE  5 MG immediate release tablet Commonly known as: Oxy IR/ROXICODONE  Take 1 tablet (5 mg total) by mouth every 6 (six) hours as needed for severe pain (pain score 7-10).   tamsulosin  0.4 MG Caps capsule Commonly known as: FLOMAX  Take 0.4 mg by mouth daily.   vitamin B-12 100 MCG tablet Commonly known as: CYANOCOBALAMIN  Take 100 mcg by mouth daily.         ROS Review of Systems  Constitutional: Negative.  Negative for fever and weight loss.  Respiratory: Negative.  Negative for cough  and shortness of breath.   Cardiovascular:  Negative for chest pain, palpitations and leg swelling.      BP 110/72 (BP Location: Left Arm, Patient Position: Sitting, Cuff Size: Normal)   Pulse 95   Resp 20   Ht 5' 5 (1.651 m)   Wt 128 lb 14.4 oz (58.5 kg)   SpO2 95% Comment: RA  BMI 21.45 kg/m   Physical Exam Constitutional:      Appearance: Normal appearance.  HENT:     Head: Normocephalic and atraumatic.  Cardiovascular:     Rate and Rhythm: Normal rate and regular rhythm.     Heart sounds: Normal heart sounds, S1 normal and S2 normal.  Pulmonary:     Effort: Pulmonary effort is normal.     Breath sounds: Normal breath sounds.  Skin:    General: Skin is warm and dry.      Neurological:     General: No focal deficit present.      Mental Status: He is alert and oriented to person, place, and time.       Imaging: CLINICAL DATA:  Status post CABG on 04/11/2024.  Weakness.   EXAM: CHEST - 2 VIEW   COMPARISON:  04/20/2024.   FINDINGS: The heart size and mediastinal contours are within normal limits. Prior median sternotomy and CABG. No pulmonary edema, focal consolidation, pleural effusion, or pneumothorax. No acute osseous abnormality.   IMPRESSION: No acute cardiopulmonary findings.     Electronically Signed   By: Harrietta Sherry M.D.   On: 06/04/2024 11:00   Assessment/Plan:  S/P CABG x 3 -We reviewed today's chest x ray. We discussed driving and he is cleared to do so since he is not taking narcotics for pain.  First time driving should be a short distance in the day time and he can slowly increase the distance.  Discussed participation in cardiac rehab and he is cleared to start to participate.  He is to increase his exercise and activity as tolerated.   -Discussed with patient that he needs to check his blood pressure daily. If readings are low (80-90 systolic) and he is symptomatic such as dizzy, lightheaded, tired, weak then he needs to reach out to cardiology and PCP.  He is to stop taking lisinopril -hydrochlorothiazide  at this time due to orthostatic hypotension.   -Discussed medication management in depth with patient.  He is to continue aspirin , simvastatin , gabapentin , midodrine , tamsulosin , metoprolol , alprazolam , vit b12 and vitamin D as prescribed.  Updated medication list and printed for patient to help clarify which medications need to be taken -Continue follow up with cardiology and PCP as scheduled -Follow up with [facility] as needed    Manuelita CHRISTELLA Rough, PA-C 11:46 AM 06/04/24

## 2024-06-04 ENCOUNTER — Encounter: Payer: Self-pay | Admitting: Family

## 2024-06-04 ENCOUNTER — Ambulatory Visit
Admission: RE | Admit: 2024-06-04 | Discharge: 2024-06-04 | Disposition: A | Discharge status: Home / Self Care | Source: Ambulatory Visit | Attending: Thoracic Surgery (Cardiothoracic Vascular Surgery)

## 2024-06-04 ENCOUNTER — Other Ambulatory Visit: Payer: Self-pay | Admitting: Thoracic Surgery (Cardiothoracic Vascular Surgery)

## 2024-06-04 ENCOUNTER — Ambulatory Visit (HOSPITAL_COMMUNITY)

## 2024-06-04 ENCOUNTER — Ambulatory Visit

## 2024-06-04 ENCOUNTER — Ambulatory Visit: Admitting: Thoracic Surgery (Cardiothoracic Vascular Surgery)

## 2024-06-04 VITALS — BP 110/72 | HR 95 | Resp 20 | Ht 65.0 in | Wt 128.9 lb

## 2024-06-04 DIAGNOSIS — Z951 Presence of aortocoronary bypass graft: Secondary | ICD-10-CM

## 2024-06-04 NOTE — Patient Instructions (Addendum)
-  Please check blood pressure everyday, if readings are 80-90s systolic then needs to contact PCP or cardiology for hypotension -If feeling dizzy or off please check blood pressure and record reading -Write down times when feeling off/lightheaded or dizzy -Restart Tamsulosin  as prescribed -Pick up Miralax for constipation and take in the morning

## 2024-06-09 ENCOUNTER — Telehealth (HOSPITAL_COMMUNITY): Payer: Self-pay

## 2024-06-09 ENCOUNTER — Other Ambulatory Visit: Payer: Self-pay | Admitting: Orthopedic Surgery

## 2024-06-09 NOTE — Telephone Encounter (Signed)
 Pt insurance is active and benefits verified through [facility] Medicare. Co-pay $0, DED $0/$0 met, out of pocket $3,900/$2,645.16 met, co-insurance 0%. No pre-authorization required. 06/09/2024 @ 9:11am, spoke with Arletta, REF# [id number].  TCR/ICR? ICR Visit(date of service)limitation? No Can multiple codes be used on the same date of service/visit?(IF ITS A LIMIT) N/A  Is this a lifetime maximum or an annual maximum? Annual Has the member used any of these services to date? No Is there a time limit (weeks/months) on start of program and/or program completion? No

## 2024-06-09 NOTE — Telephone Encounter (Signed)
 Called patient regarding cardiac rehab, patient confirmed interest. He stated he is willing to do 2x days per week and only wants to attend exercise.  Patient wants the 10:15 class but we are on a waitlist for that class until the October schedule opens. Informed patient we will call him when October is available to schedule.

## 2024-06-14 ENCOUNTER — Telehealth (HOSPITAL_COMMUNITY): Payer: Self-pay

## 2024-06-14 NOTE — Telephone Encounter (Signed)
 Called patient to see if he was interested in participating in the Cardiac Rehab Program. Patient will come in for orientation on 10/07 and will attend the 10:15 exercise class on Mondays & Wednesdays.  Pensions consultant.

## 2024-06-22 DIAGNOSIS — I10 Essential (primary) hypertension: Secondary | ICD-10-CM | POA: Diagnosis not present

## 2024-06-22 DIAGNOSIS — E7800 Pure hypercholesterolemia, unspecified: Secondary | ICD-10-CM | POA: Diagnosis not present

## 2024-06-29 ENCOUNTER — Encounter (HOSPITAL_COMMUNITY)
Admission: RE | Admit: 2024-06-29 | Discharge: 2024-06-29 | Disposition: A | Discharge status: Home / Self Care | Source: Ambulatory Visit | Attending: Cardiology | Admitting: Cardiology

## 2024-06-29 ENCOUNTER — Encounter (HOSPITAL_COMMUNITY): Payer: Self-pay

## 2024-06-29 VITALS — BP 114/72 | HR 70 | Ht 64.25 in | Wt 136.0 lb

## 2024-06-29 DIAGNOSIS — I214 Non-ST elevation (NSTEMI) myocardial infarction: Secondary | ICD-10-CM | POA: Diagnosis present

## 2024-06-29 DIAGNOSIS — Z951 Presence of aortocoronary bypass graft: Secondary | ICD-10-CM | POA: Diagnosis present

## 2024-06-29 HISTORY — DX: Atherosclerotic heart disease of native coronary artery without angina pectoris: I25.10

## 2024-06-29 NOTE — Progress Notes (Signed)
 Cardiac Rehab Medication Review by a Nurse  Does the patient  feel that his/her medications are working for him/her?  Yes  Has the patient been experiencing any side effects to the medications prescribed?   NO  Does the patient measure his/her own blood pressure or blood glucose at home?  NO  Does the patient have any problems obtaining medications due to transportation or finances?    NO  Understanding of regimen: fair Understanding of indications: fair Potential of compliance: good    Nurse comments: Eeshan is taking his medications as prescribed and has a good understanding of what his medications are for. Toan has a BP cuff. Leeroy does not check his blood pressure on a regular basis.    Hadassah Gaw [facility] RN 06/29/2024 10:44 AM

## 2024-06-29 NOTE — Progress Notes (Signed)
 Cardiac Individual Treatment Plan  Patient Details  Name: Jesse [name] MRN: [id number] Date of Birth: 1941-04-19 Referring Provider:   Flowsheet Row INTENSIVE CARDIAC REHAB ORIENT from 06/29/2024 in [facility]  Referring Provider Ladona Heinz, MD    Initial Encounter Date:  Flowsheet Row INTENSIVE CARDIAC REHAB ORIENT from 06/29/2024 in [facility]  Date 06/29/24    Visit Diagnosis: 04/10/24 NSTEMI  04/11/24 S/P CABG x 3  Patient's Home Medications on Admission:  Current Outpatient Medications:    ALPRAZolam  (XANAX ) 1 MG tablet, Take 0.5 tablets (0.5 mg total) by mouth at bedtime., Disp: 30 tablet, Rfl: 0   aspirin  EC 325 MG tablet, Take 1 tablet (325 mg total) by mouth daily., Disp: , Rfl:    Cholecalciferol  50 MCG (2000 UT) CAPS, Take 2,000 Units by mouth daily., Disp: , Rfl:    fish oil-omega-3 fatty acids 1000 MG capsule, Take 1 g by mouth every other day., Disp: , Rfl:    gabapentin  (NEURONTIN ) 100 MG capsule, Take 2 capsules (200 mg total) by mouth 3 (three) times daily., Disp: 180 capsule, Rfl: 2   metoprolol  tartrate (LOPRESSOR ) 25 MG tablet, Take 1 tablet (25 mg total) by mouth 2 (two) times daily., Disp: 180 tablet, Rfl: 3   midodrine  (PROAMATINE ) 10 MG tablet, Take 1 tablet (10 mg total) by mouth 3 (three) times daily with meals., Disp: , Rfl:    sertraline (ZOLOFT) 25 MG tablet, 1 tablet Orally Once a day; Duration: 30 days, Disp: , Rfl:    simvastatin  (ZOCOR ) 20 MG tablet, Take 20 mg by mouth at bedtime., Disp: , Rfl:    tamsulosin  (FLOMAX ) 0.4 MG CAPS capsule, Take 0.4 mg by mouth daily., Disp: , Rfl:    vitamin B-12 (CYANOCOBALAMIN ) 100 MCG tablet, Take 100 mcg by mouth daily., Disp: , Rfl:    naproxen  (NAPROSYN ) 500 MG tablet, TAKE 1 TABLET BY MOUTH TWICE A DAY WITH FOOD (Patient not taking: Reported on 06/29/2024), Disp: 60 tablet, Rfl: 1   oxyCODONE   (OXY IR/ROXICODONE ) 5 MG immediate release tablet, Take 1 tablet (5 mg total) by mouth every 6 (six) hours as needed for severe pain (pain score 7-10). (Patient not taking: Reported on 06/29/2024), Disp: 28 tablet, Rfl: 0  Past Medical History: Past Medical History:  Diagnosis Date   Coronary artery disease    Depression    Hypercholesteremia    Hypertension    HZ (herpes zoster) 09/02/2011   Neuritis    left foot   Polycythemia rubra vera (HCC) 09/02/2011    Tobacco Use: Social History   Tobacco Use  Smoking Status Never  Smokeless Tobacco Never  Tobacco Comments   never used tobacco    Labs: Review Flowsheet       Latest Ref Rng & Units 04/10/2024 04/11/2024  Labs for ITP Cardiac and Pulmonary Rehab  Cholestrol 0 - 200 mg/dL 870  -  LDL (calc) 0 - 99 mg/dL 83  -  HDL-C >59 mg/dL 36  -  Trlycerides <849 mg/dL 51  -  Hemoglobin J8r 4.8 - 5.6 % 4.9  -  PH, Arterial 7.35 - 7.45 - 7.374  7.387  7.365  7.387  7.362  7.343   PCO2 arterial 32 - 48 mmHg - 34.1  30.5  34.9  32.1  39.6  32.3   Bicarbonate 20.0 - 28.0 mmol/L - 19.9  18.3  20.3  19.3  22.5  17.6   TCO2 22 - 32 mmol/L - 21  19  21  22  20  22  21  24  24  19  20  19    Acid-base deficit 0.0 - 2.0 mmol/L - 5.0  6.0  5.0  5.0  3.0  7.0   O2 Saturation % - 98  99  97  100  100  100     Details       Multiple values from one day are sorted in reverse-chronological order         Capillary Blood Glucose: Lab Results  Component Value Date   GLUCAP 124 (H) 04/22/2024   GLUCAP 111 (H) 04/13/2024   GLUCAP 102 (H) 04/13/2024   GLUCAP 110 (H) 04/12/2024   GLUCAP 122 (H) 04/12/2024     Exercise Target Goals: Exercise Program Goal: Individual exercise prescription set using results from initial 6 min walk test and THRR while considering  patient's activity barriers and safety.   Exercise Prescription Goal: Initial exercise prescription builds to 30-45 minutes a day of aerobic activity, 2-3 days per week.  Home  exercise guidelines will be given to patient during program as part of exercise prescription that the participant will acknowledge.  Activity Barriers & Risk Stratification:  Activity Barriers & Cardiac Risk Stratification - 06/29/24 1155       Activity Barriers & Cardiac Risk Stratification   Activity Barriers History of Falls;Assistive Device;Back Problems;Balance Concerns;Other (comment)    Comments Neuropathy low back and down left leg with numbness.    Cardiac Risk Stratification High          6 Minute Walk:  6 Minute Walk     Row Name 06/29/24 1202         6 Minute Walk   Phase Initial     Distance 1108.8 feet     Walk Time 6 minutes     # of Rest Breaks 0     MPH 2.1     METS 2     RPE 11     Perceived Dyspnea  1     VO2 Peak 6.98     Symptoms Yes (comment)     Comments Mild shortness of breath.     Resting HR 70 bpm     Resting BP 114/72     Resting Oxygen Saturation  97 %     Exercise Oxygen Saturation  during 6 min walk 100 %     Max Ex. HR 79 bpm     Max Ex. BP 140/76     2 Minute Post BP 142/72        Oxygen Initial Assessment:   Oxygen Re-Evaluation:   Oxygen Discharge (Final Oxygen Re-Evaluation):   Initial Exercise Prescription:  Initial Exercise Prescription - 06/29/24 1400       Date of Initial Exercise RX and Referring Provider   Date 06/29/24    Referring Provider Ladona Heinz, MD    Expected Discharge Date 09/22/24      NuStep   Level 1    SPM 65    Minutes 25    METs 2      Prescription Details   Frequency (times per week) 3    Duration Progress to 30 minutes of continuous aerobic without signs/symptoms of physical distress      Intensity   THRR 40-80% of Max Heartrate 56-112    Ratings of Perceived Exertion 11-13    Perceived Dyspnea 0-4  Progression   Progression Continue to progress workloads to maintain intensity without signs/symptoms of physical distress.      Resistance Training   Training Prescription Yes     Weight 2 lbs    Reps 10-15          Perform Capillary Blood Glucose checks as needed.  Exercise Prescription Changes:   Exercise Comments:   Exercise Goals and Review:   Exercise Goals     Row Name 06/29/24 1155             Exercise Goals   Increase Physical Activity Yes       Intervention Provide advice, education, support and counseling about physical activity/exercise needs.;Develop an individualized exercise prescription for aerobic and resistive training based on initial evaluation findings, risk stratification, comorbidities and participant's personal goals.       Expected Outcomes Short Term: Attend rehab on a regular basis to increase amount of physical activity.;Long Term: Exercising regularly at least 3-5 days a week.;Long Term: Add in home exercise to make exercise part of routine and to increase amount of physical activity.       Increase Strength and Stamina Yes       Intervention Provide advice, education, support and counseling about physical activity/exercise needs.;Develop an individualized exercise prescription for aerobic and resistive training based on initial evaluation findings, risk stratification, comorbidities and participant's personal goals.       Expected Outcomes Short Term: Increase workloads from initial exercise prescription for resistance, speed, and METs.;Short Term: Perform resistance training exercises routinely during rehab and add in resistance training at home;Long Term: Improve cardiorespiratory fitness, muscular endurance and strength as measured by increased METs and functional capacity ( )       Able to understand and use rate of perceived exertion (RPE) scale Yes       Intervention Provide education and explanation on how to use RPE scale       Expected Outcomes Short Term: Able to use RPE daily in rehab to express subjective intensity level;Long Term:  Able to use RPE to guide intensity level when exercising independently        Understanding of Exercise Prescription Yes       Intervention Provide education, explanation, and written materials on patient's individual exercise prescription       Expected Outcomes Short Term: Able to explain program exercise prescription;Long Term: Able to explain home exercise prescription to exercise independently          Exercise Goals Re-Evaluation :   Discharge Exercise Prescription (Final Exercise Prescription Changes):   Nutrition:  Target Goals: Understanding of nutrition guidelines, daily intake of sodium 1500mg , cholesterol 200mg , calories 30% from fat and 7% or less from saturated fats, daily to have 5 or more servings of fruits and vegetables.  Biometrics:  Pre Biometrics - 06/29/24 1037       Pre Biometrics   Waist Circumference 36.5 inches    Hip Circumference 37.25 inches    Waist to Hip Ratio 0.98 %    Triceps Skinfold 9 mm    % Body Fat 23 %    Grip Strength 10 kg    Flexibility --   Not performed due to back issues.   Single Leg Stand 4.52 seconds           Nutrition Therapy Plan and Nutrition Goals:   Nutrition Assessments:  MEDIFICTS Score Key: >=70 Need to make dietary changes  40-70 Heart Healthy Diet <= 40 Therapeutic Level Cholesterol Diet  Picture Your Plate Scores: <59 Unhealthy dietary pattern with much room for improvement. 41-50 Dietary pattern unlikely to meet recommendations for good health and room for improvement. 51-60 More healthful dietary pattern, with some room for improvement.  >60 Healthy dietary pattern, although there may be some specific behaviors that could be improved.    Nutrition Goals Re-Evaluation:   Nutrition Goals Re-Evaluation:   Nutrition Goals Discharge (Final Nutrition Goals Re-Evaluation):   Psychosocial: Target Goals: Acknowledge presence or absence of significant depression and/or stress, maximize coping skills, provide positive support system. Participant is able to verbalize types and  ability to use techniques and skills needed for reducing stress and depression.  Initial Review & Psychosocial Screening:  Initial Psych Review & Screening - 06/29/24 1305       Initial Review   Current issues with Current Depression;History of Depression;Current Anxiety/Panic;Current Stress Concerns;Current Psychotropic Meds    Source of Stress Concerns Family    Comments Tristin lives with his sister who is 38 years old. Giles's sister has schizophrenia. Vraj's sister has a care giver who comes in and looks after her.      Family Dynamics   Good Support System? No   Casson says that he is alone. Lavontay says he has a second sister who has parkinsons. Ephraim did say he has a niece and a nephew he can rely on upon questioning.   Strains Illness and family care strain    Comments Cairo is taking an antidepressant and antianxiety medication. Marguis says he is not interested in receving counseling at this time.      Barriers   Psychosocial barriers to participate in program The patient should benefit from training in stress management and relaxation.      Screening Interventions   Interventions To provide support and resources with identified psychosocial needs;Encouraged to exercise;Provide feedback about the scores to participant    Expected Outcomes Long Term Goal: Stressors or current issues are controlled or eliminated.;Long Term goal: The participant improves quality of Life and PHQ9 Scores as seen by post scores and/or verbalization of changes          Quality of Life Scores:  Quality of Life - 06/29/24 1440       Quality of Life   Select Quality of Life      Quality of Life Scores   Health/Function Pre 20.38 %    Socioeconomic Pre 21.42 %    Psych/Spiritual Pre 20.21 %    Family Pre 17 %    GLOBAL Pre 20.31 %         Scores of 19 and below usually indicate a poorer quality of life in these areas.  A difference of  2-3 points is a clinically meaningful difference.  A  difference of 2-3 points in the total score of the Quality of Life Index has been associated with significant improvement in overall quality of life, self-image, physical symptoms, and general health in studies assessing change in quality of life.  PHQ-9: Review Flowsheet  More data may exist      06/29/2024 04/06/2024 01/21/2023 12/12/2022 12/28/2013  Depression screen PHQ 2/9  Decreased Interest 2 0 0 2 0  Down, Depressed, Hopeless 0 0 0 1 0  PHQ - 2 Score 2 0 0 3 0  Altered sleeping 0 - - 1 -  Tired, decreased energy 2 - - 2 -  Change in appetite 2 - - 0 -  Feeling bad or failure about yourself  0 - -  0 -  Trouble concentrating 1 - - 0 -  Moving slowly or fidgety/restless 0 - - 0 -  Suicidal thoughts 0 - - 0 -  PHQ-9 Score 7 - - 6 -  Difficult doing work/chores Somewhat difficult - - - -   Interpretation of Total Score  Total Score Depression Severity:  1-4 = Minimal depression, 5-9 = Mild depression, 10-14 = Moderate depression, 15-19 = Moderately severe depression, 20-27 = Severe depression   Psychosocial Evaluation and Intervention:   Psychosocial Re-Evaluation:   Psychosocial Discharge (Final Psychosocial Re-Evaluation):   Vocational Rehabilitation: Provide vocational rehab assistance to qualifying candidates.   Vocational Rehab Evaluation & Intervention:  Vocational Rehab - 06/29/24 1347       Initial Vocational Rehab Evaluation & Intervention   Assessment shows need for Vocational Rehabilitation No   Panfilo is retired and does not need vocational rehab at this time.         Education: Education Goals: Education classes will be provided on a weekly basis, covering required topics. Participant will state understanding/return demonstration of topics presented.     Core Videos: Exercise    Move It!  Clinical staff conducted group or individual video education with verbal and written material and guidebook.  Patient learns the recommended Pritikin exercise  program. Exercise with the goal of living a long, healthy life. Some of the health benefits of exercise include controlled diabetes, healthier blood pressure levels, improved cholesterol levels, improved heart and lung capacity, improved sleep, and better body composition. Everyone should speak with their doctor before starting or changing an exercise routine.  Biomechanical Limitations Clinical staff conducted group or individual video education with verbal and written material and guidebook.  Patient learns how biomechanical limitations can impact exercise and how we can mitigate and possibly overcome limitations to have an impactful and balanced exercise routine.  Body Composition Clinical staff conducted group or individual video education with verbal and written material and guidebook.  Patient learns that body composition (ratio of muscle mass to fat mass) is a key component to assessing overall fitness, rather than body weight alone. Increased fat mass, especially visceral belly fat, can put us  at increased risk for metabolic syndrome, type 2 diabetes, heart disease, and even death. It is recommended to combine diet and exercise (cardiovascular and resistance training) to improve your body composition. Seek guidance from your physician and exercise physiologist before implementing an exercise routine.  Exercise Action Plan Clinical staff conducted group or individual video education with verbal and written material and guidebook.  Patient learns the recommended strategies to achieve and enjoy long-term exercise adherence, including variety, self-motivation, self-efficacy, and positive decision making. Benefits of exercise include fitness, good health, weight management, more energy, better sleep, less stress, and overall well-being.  Medical   Heart Disease Risk Reduction Clinical staff conducted group or individual video education with verbal and written material and guidebook.  Patient  learns our heart is our most vital organ as it circulates oxygen, nutrients, white blood cells, and hormones throughout the entire body, and carries waste away. Data supports a plant-based eating plan like the Pritikin Program for its effectiveness in slowing progression of and reversing heart disease. The video provides a number of recommendations to address heart disease.   Metabolic Syndrome and Belly Fat  Clinical staff conducted group or individual video education with verbal and written material and guidebook.  Patient learns what metabolic syndrome is, how it leads to heart disease, and how one can reverse it  and keep it from coming back. You have metabolic syndrome if you have 3 of the following 5 criteria: abdominal obesity, high blood pressure, high triglycerides, low HDL cholesterol, and high blood sugar.  Hypertension and Heart Disease Clinical staff conducted group or individual video education with verbal and written material and guidebook.  Patient learns that high blood pressure, or hypertension, is very common in the [place] . Hypertension is largely due to excessive salt intake, but other important risk factors include being overweight, physical inactivity, drinking too much alcohol, smoking, and not eating enough potassium from fruits and vegetables. High blood pressure is a leading risk factor for heart attack, stroke, congestive heart failure, dementia, kidney failure, and premature death. Long-term effects of excessive salt intake include stiffening of the arteries and thickening of heart muscle and organ damage. Recommendations include ways to reduce hypertension and the risk of heart disease.  Diseases of Our Time - Focusing on Diabetes Clinical staff conducted group or individual video education with verbal and written material and guidebook.  Patient learns why the best way to stop diseases of our time is prevention, through food and other lifestyle changes. Medicine (such  as prescription pills and surgeries) is often only a Band-Aid on the problem, not a long-term solution. Most common diseases of our time include obesity, type 2 diabetes, hypertension, heart disease, and cancer. The Pritikin Program is recommended and has been proven to help reduce, reverse, and/or prevent the damaging effects of metabolic syndrome.  Nutrition   Overview of the Pritikin Eating Plan  Clinical staff conducted group or individual video education with verbal and written material and guidebook.  Patient learns about the Pritikin Eating Plan for disease risk reduction. The Pritikin Eating Plan emphasizes a wide variety of unrefined, minimally-processed carbohydrates, like fruits, vegetables, whole grains, and legumes. Go, Caution, and Stop food choices are explained. Plant-based and lean animal proteins are emphasized. Rationale provided for low sodium intake for blood pressure control, low added sugars for blood sugar stabilization, and low added fats and oils for coronary artery disease risk reduction and weight management.  Calorie Density  Clinical staff conducted group or individual video education with verbal and written material and guidebook.  Patient learns about calorie density and how it impacts the Pritikin Eating Plan. Knowing the characteristics of the food you choose will help you decide whether those foods will lead to weight gain or weight loss, and whether you want to consume more or less of them. Weight loss is usually a side effect of the Pritikin Eating Plan because of its focus on low calorie-dense foods.  Label Reading  Clinical staff conducted group or individual video education with verbal and written material and guidebook.  Patient learns about the Pritikin recommended label reading guidelines and corresponding recommendations regarding calorie density, added sugars, sodium content, and whole grains.  Dining Out - Part 1  Clinical staff conducted group or  individual video education with verbal and written material and guidebook.  Patient learns that restaurant meals can be sabotaging because they can be so high in calories, fat, sodium, and/or sugar. Patient learns recommended strategies on how to positively address this and avoid unhealthy pitfalls.  Facts on Fats  Clinical staff conducted group or individual video education with verbal and written material and guidebook.  Patient learns that lifestyle modifications can be just as effective, if not more so, as many medications for lowering your risk of heart disease. A Pritikin lifestyle can help to reduce your risk  of inflammation and atherosclerosis (cholesterol build-up, or plaque, in the artery walls). Lifestyle interventions such as dietary choices and physical activity address the cause of atherosclerosis. A review of the types of fats and their impact on blood cholesterol levels, along with dietary recommendations to reduce fat intake is also included.  Nutrition Action Plan  Clinical staff conducted group or individual video education with verbal and written material and guidebook.  Patient learns how to incorporate Pritikin recommendations into their lifestyle. Recommendations include planning and keeping personal health goals in mind as an important part of their success.  Healthy Mind-Set    Healthy Minds, Bodies, Hearts  Clinical staff conducted group or individual video education with verbal and written material and guidebook.  Patient learns how to identify when they are stressed. Video will discuss the impact of that stress, as well as the many benefits of stress management. Patient will also be introduced to stress management techniques. The way we think, act, and feel has an impact on our hearts.  How Our Thoughts Can Heal Our Hearts  Clinical staff conducted group or individual video education with verbal and written material and guidebook.  Patient learns that negative thoughts  can cause depression and anxiety. This can result in negative lifestyle behavior and serious health problems. Cognitive behavioral therapy is an effective method to help control our thoughts in order to change and improve our emotional outlook.  Additional Videos:  Exercise    Improving Performance  Clinical staff conducted group or individual video education with verbal and written material and guidebook.  Patient learns to use a non-linear approach by alternating intensity levels and lengths of time spent exercising to help burn more calories and lose more body fat. Cardiovascular exercise helps improve heart health, metabolism, hormonal balance, blood sugar control, and recovery from fatigue. Resistance training improves strength, endurance, balance, coordination, reaction time, metabolism, and muscle mass. Flexibility exercise improves circulation, posture, and balance. Seek guidance from your physician and exercise physiologist before implementing an exercise routine and learn your capabilities and proper form for all exercise.  Introduction to Yoga  Clinical staff conducted group or individual video education with verbal and written material and guidebook.  Patient learns about yoga, a discipline of the coming together of mind, breath, and body. The benefits of yoga include improved flexibility, improved range of motion, better posture and core strength, increased lung function, weight loss, and positive self-image. Yoga's heart health benefits include lowered blood pressure, healthier heart rate, decreased cholesterol and triglyceride levels, improved immune function, and reduced stress. Seek guidance from your physician and exercise physiologist before implementing an exercise routine and learn your capabilities and proper form for all exercise.  Medical   Aging: Enhancing Your Quality of Life  Clinical staff conducted group or individual video education with verbal and written material and  guidebook.  Patient learns key strategies and recommendations to stay in good physical health and enhance quality of life, such as prevention strategies, having an advocate, securing a Health Care Proxy and Power of Attorney, and keeping a list of medications and system for tracking them. It also discusses how to avoid risk for bone loss.  Biology of Weight Control  Clinical staff conducted group or individual video education with verbal and written material and guidebook.  Patient learns that weight gain occurs because we consume more calories than we burn (eating more, moving less). Even if your body weight is normal, you may have higher ratios of fat compared to muscle mass.  Too much body fat puts you at increased risk for cardiovascular disease, heart attack, stroke, type 2 diabetes, and obesity-related cancers. In addition to exercise, following the Pritikin Eating Plan can help reduce your risk.  Decoding Lab Results  Clinical staff conducted group or individual video education with verbal and written material and guidebook.  Patient learns that lab test reflects one measurement whose values change over time and are influenced by many factors, including medication, stress, sleep, exercise, food, hydration, pre-existing medical conditions, and more. It is recommended to use the knowledge from this video to become more involved with your lab results and evaluate your numbers to speak with your doctor.   Diseases of Our Time - Overview  Clinical staff conducted group or individual video education with verbal and written material and guidebook.  Patient learns that according to the CDC, 50% to 70% of chronic diseases (such as obesity, type 2 diabetes, elevated lipids, hypertension, and heart disease) are avoidable through lifestyle improvements including healthier food choices, listening to satiety cues, and increased physical activity.  Sleep Disorders Clinical staff conducted group or individual  video education with verbal and written material and guidebook.  Patient learns how good quality and duration of sleep are important to overall health and well-being. Patient also learns about sleep disorders and how they impact health along with recommendations to address them, including discussing with a physician.  Nutrition  Dining Out - Part 2 Clinical staff conducted group or individual video education with verbal and written material and guidebook.  Patient learns how to plan ahead and communicate in order to maximize their dining experience in a healthy and nutritious manner. Included are recommended food choices based on the type of restaurant the patient is visiting.   Fueling a Banker conducted group or individual video education with verbal and written material and guidebook.  There is a strong connection between our food choices and our health. Diseases like obesity and type 2 diabetes are very prevalent and are in large-part due to lifestyle choices. The Pritikin Eating Plan provides plenty of food and hunger-curbing satisfaction. It is easy to follow, affordable, and helps reduce health risks.  Menu Workshop  Clinical staff conducted group or individual video education with verbal and written material and guidebook.  Patient learns that restaurant meals can sabotage health goals because they are often packed with calories, fat, sodium, and sugar. Recommendations include strategies to plan ahead and to communicate with the manager, chef, or server to help order a healthier meal.  Planning Your Eating Strategy  Clinical staff conducted group or individual video education with verbal and written material and guidebook.  Patient learns about the Pritikin Eating Plan and its benefit of reducing the risk of disease. The Pritikin Eating Plan does not focus on calories. Instead, it emphasizes high-quality, nutrient-rich foods. By knowing the characteristics of the  foods, we choose, we can determine their calorie density and make informed decisions.  Targeting Your Nutrition Priorities  Clinical staff conducted group or individual video education with verbal and written material and guidebook.  Patient learns that lifestyle habits have a tremendous impact on disease risk and progression. This video provides eating and physical activity recommendations based on your personal health goals, such as reducing LDL cholesterol, losing weight, preventing or controlling type 2 diabetes, and reducing high blood pressure.  Vitamins and Minerals  Clinical staff conducted group or individual video education with verbal and written material and guidebook.  Patient learns different  ways to obtain key vitamins and minerals, including through a recommended healthy diet. It is important to discuss all supplements you take with your doctor.   Healthy Mind-Set    Smoking Cessation  Clinical staff conducted group or individual video education with verbal and written material and guidebook.  Patient learns that cigarette smoking and tobacco addiction pose a serious health risk which affects millions of people. Stopping smoking will significantly reduce the risk of heart disease, lung disease, and many forms of cancer. Recommended strategies for quitting are covered, including working with your doctor to develop a successful plan.  Culinary   Becoming a Set designer conducted group or individual video education with verbal and written material and guidebook.  Patient learns that cooking at home can be healthy, cost-effective, quick, and puts them in control. Keys to cooking healthy recipes will include looking at your recipe, assessing your equipment needs, planning ahead, making it simple, choosing cost-effective seasonal ingredients, and limiting the use of added fats, salts, and sugars.  Cooking - Breakfast and Snacks  Clinical staff conducted group or  individual video education with verbal and written material and guidebook.  Patient learns how important breakfast is to satiety and nutrition through the entire day. Recommendations include key foods to eat during breakfast to help stabilize blood sugar levels and to prevent overeating at meals later in the day. Planning ahead is also a key component.  Cooking - Educational psychologist conducted group or individual video education with verbal and written material and guidebook.  Patient learns eating strategies to improve overall health, including an approach to cook more at home. Recommendations include thinking of animal protein as a side on your plate rather than center stage and focusing instead on lower calorie dense options like vegetables, fruits, whole grains, and plant-based proteins, such as beans. Making sauces in large quantities to freeze for later and leaving the skin on your vegetables are also recommended to maximize your experience.  Cooking - Healthy Salads and Dressing Clinical staff conducted group or individual video education with verbal and written material and guidebook.  Patient learns that vegetables, fruits, whole grains, and legumes are the foundations of the Pritikin Eating Plan. Recommendations include how to incorporate each of these in flavorful and healthy salads, and how to create homemade salad dressings. Proper handling of ingredients is also covered. Cooking - Soups and [employer] - Soups and Desserts Clinical staff conducted group or individual video education with verbal and written material and guidebook.  Patient learns that Pritikin soups and desserts make for easy, nutritious, and delicious snacks and meal components that are low in sodium, fat, sugar, and calorie density, while high in vitamins, minerals, and filling fiber. Recommendations include simple and healthy ideas for soups and desserts.   Overview     The Pritikin Solution Program  Overview Clinical staff conducted group or individual video education with verbal and written material and guidebook.  Patient learns that the results of the Pritikin Program have been documented in more than 100 articles published in peer-reviewed journals, and the benefits include reducing risk factors for (and, in some cases, even reversing) high cholesterol, high blood pressure, type 2 diabetes, obesity, and more! An overview of the three key pillars of the Pritikin Program will be covered: eating well, doing regular exercise, and having a healthy mind-set.  WORKSHOPS  Exercise: Exercise Basics: Building Your Action Plan Clinical staff led group instruction and group discussion with  PowerPoint presentation and patient guidebook. To enhance the learning environment the use of posters, models and videos may be added. At the conclusion of this workshop, patients will comprehend the difference between physical activity and exercise, as well as the benefits of incorporating both, into their routine. Patients will understand the FITT (Frequency, Intensity, Time, and Type) principle and how to use it to build an exercise action plan. In addition, safety concerns and other considerations for exercise and cardiac rehab will be addressed by the presenter. The purpose of this lesson is to promote a comprehensive and effective weekly exercise routine in order to improve patients' overall level of fitness.   Managing Heart Disease: Your Path to a Healthier Heart Clinical staff led group instruction and group discussion with PowerPoint presentation and patient guidebook. To enhance the learning environment the use of posters, models and videos may be added.At the conclusion of this workshop, patients will understand the anatomy and physiology of the heart. Additionally, they will understand how Pritikin's three pillars impact the risk factors, the progression, and the management of heart disease.  The  purpose of this lesson is to provide a high-level overview of the heart, heart disease, and how the Pritikin lifestyle positively impacts risk factors.  Exercise Biomechanics Clinical staff led group instruction and group discussion with PowerPoint presentation and patient guidebook. To enhance the learning environment the use of posters, models and videos may be added. Patients will learn how the structural parts of their bodies function and how these functions impact their daily activities, movement, and exercise. Patients will learn how to promote a neutral spine, learn how to manage pain, and identify ways to improve their physical movement in order to promote healthy living. The purpose of this lesson is to expose patients to common physical limitations that impact physical activity. Participants will learn practical ways to adapt and manage aches and pains, and to minimize their effect on regular exercise. Patients will learn how to maintain good posture while sitting, walking, and lifting.  Balance Training and Fall Prevention  Clinical staff led group instruction and group discussion with PowerPoint presentation and patient guidebook. To enhance the learning environment the use of posters, models and videos may be added. At the conclusion of this workshop, patients will understand the importance of their sensorimotor skills (vision, proprioception, and the vestibular system) in maintaining their ability to balance as they age. Patients will apply a variety of balancing exercises that are appropriate for their current level of function. Patients will understand the common causes for poor balance, possible solutions to these problems, and ways to modify their physical environment in order to minimize their fall risk. The purpose of this lesson is to teach patients about the importance of maintaining balance as they age and ways to minimize their risk of falling.  WORKSHOPS   Nutrition:   Fueling a Ship broker led group instruction and group discussion with PowerPoint presentation and patient guidebook. To enhance the learning environment the use of posters, models and videos may be added. Patients will review the foundational principles of the Pritikin Eating Plan and understand what constitutes a serving size in each of the food groups. Patients will also learn Pritikin-friendly foods that are better choices when away from home and review make-ahead meal and snack options. Calorie density will be reviewed and applied to three nutrition priorities: weight maintenance, weight loss, and weight gain. The purpose of this lesson is to reinforce (in a group setting) the key concepts  around what patients are recommended to eat and how to apply these guidelines when away from home by planning and selecting Pritikin-friendly options. Patients will understand how calorie density may be adjusted for different weight management goals.  Mindful Eating  Clinical staff led group instruction and group discussion with PowerPoint presentation and patient guidebook. To enhance the learning environment the use of posters, models and videos may be added. Patients will briefly review the concepts of the Pritikin Eating Plan and the importance of low-calorie dense foods. The concept of mindful eating will be introduced as well as the importance of paying attention to internal hunger signals. Triggers for non-hunger eating and techniques for dealing with triggers will be explored. The purpose of this lesson is to provide patients with the opportunity to review the basic principles of the Pritikin Eating Plan, discuss the value of eating mindfully and how to measure internal cues of hunger and fullness using the Hunger Scale. Patients will also discuss reasons for non-hunger eating and learn strategies to use for controlling emotional eating.  Targeting Your Nutrition Priorities Clinical staff led  group instruction and group discussion with PowerPoint presentation and patient guidebook. To enhance the learning environment the use of posters, models and videos may be added. Patients will learn how to determine their genetic susceptibility to disease by reviewing their family history. Patients will gain insight into the importance of diet as part of an overall healthy lifestyle in mitigating the impact of genetics and other environmental insults. The purpose of this lesson is to provide patients with the opportunity to assess their personal nutrition priorities by looking at their family history, their own health history and current risk factors. Patients will also be able to discuss ways of prioritizing and modifying the Pritikin Eating Plan for their highest risk areas  Menu  Clinical staff led group instruction and group discussion with PowerPoint presentation and patient guidebook. To enhance the learning environment the use of posters, models and videos may be added. Using menus brought in from [employer], or printed from [employer], patients will apply the Pritikin dining out guidelines that were presented in the [employer] video. Patients will also be able to practice these guidelines in a variety of provided scenarios. The purpose of this lesson is to provide patients with the opportunity to practice hands-on learning of the Pritikin Dining Out guidelines with actual menus and practice scenarios.  Label Reading Clinical staff led group instruction and group discussion with PowerPoint presentation and patient guidebook. To enhance the learning environment the use of posters, models and videos may be added. Patients will review and discuss the Pritikin label reading guidelines presented in Pritikin's Label Reading Educational series video. Using fool labels brought in from local grocery stores and markets, patients will apply the label reading guidelines and determine  if the packaged food meet the Pritikin guidelines. The purpose of this lesson is to provide patients with the opportunity to review, discuss, and practice hands-on learning of the Pritikin Label Reading guidelines with actual packaged food labels. Cooking School  Pritikin's [employer] are designed to teach patients ways to prepare quick, simple, and affordable recipes at home. The importance of nutrition's role in chronic disease risk reduction is reflected in its emphasis in the overall Pritikin program. By learning how to prepare essential core Pritikin Eating Plan recipes, patients will increase control over what they eat; be able to customize the flavor of foods without the use of added salt, sugar,  or fat; and improve the quality of the food they consume. By learning a set of core recipes which are easily assembled, quickly prepared, and affordable, patients are more likely to prepare more healthy foods at home. These workshops focus on convenient breakfasts, simple entres, side dishes, and desserts which can be prepared with minimal effort and are consistent with nutrition recommendations for cardiovascular risk reduction. Cooking [employer] are taught by a Armed forces logistics/support/administrative officer (RD) who has been trained by the [employer]. The chef or RD has a clear understanding of the importance of minimizing - if not completely eliminating - added fat, sugar, and sodium in recipes. Throughout the series of Cooking School Workshop sessions, patients will learn about healthy ingredients and efficient methods of cooking to build confidence in their capability to prepare    Cooking School weekly topics:  Adding Flavor- Sodium-Free  Fast and Healthy Breakfasts  Powerhouse Plant-Based Proteins  Satisfying Salads and Dressings  Simple Sides and Sauces  International Cuisine-Spotlight on the [employer] Zones  Delicious Desserts  Savory Soups  [employer] - Meals in a  Astronomer Appetizers and Snacks  Comforting Weekend Breakfasts  One-Pot Wonders   Fast Evening Meals  Landscape architect Your Pritikin Plate  WORKSHOPS   Healthy Mindset (Psychosocial):  Focused Goals, Sustainable Changes Clinical staff led group instruction and group discussion with PowerPoint presentation and patient guidebook. To enhance the learning environment the use of posters, models and videos may be added. Patients will be able to apply effective goal setting strategies to establish at least one personal goal, and then take consistent, meaningful action toward that goal. They will learn to identify common barriers to achieving personal goals and develop strategies to overcome them. Patients will also gain an understanding of how our mind-set can impact our ability to achieve goals and the importance of cultivating a positive and growth-oriented mind-set. The purpose of this lesson is to provide patients with a deeper understanding of how to set and achieve personal goals, as well as the tools and strategies needed to overcome common obstacles which may arise along the way.  From Head to Heart: The Power of a Healthy Outlook  Clinical staff led group instruction and group discussion with PowerPoint presentation and patient guidebook. To enhance the learning environment the use of posters, models and videos may be added. Patients will be able to recognize and describe the impact of emotions and mood on physical health. They will discover the importance of self-care and explore self-care practices which may work for them. Patients will also learn how to utilize the 4 C's to cultivate a healthier outlook and better manage stress and challenges. The purpose of this lesson is to demonstrate to patients how a healthy outlook is an essential part of maintaining good health, especially as they continue their cardiac rehab journey.  Healthy Sleep for a Healthy Heart Clinical staff  led group instruction and group discussion with PowerPoint presentation and patient guidebook. To enhance the learning environment the use of posters, models and videos may be added. At the conclusion of this workshop, patients will be able to demonstrate knowledge of the importance of sleep to overall health, well-being, and quality of life. They will understand the symptoms of, and treatments for, common sleep disorders. Patients will also be able to identify daytime and nighttime behaviors which impact sleep, and they will be able to apply these tools to help manage sleep-related challenges. The purpose of this  lesson is to provide patients with a general overview of sleep and outline the importance of quality sleep. Patients will learn about a few of the most common sleep disorders. Patients will also be introduced to the concept of "sleep hygiene," and discover ways to self-manage certain sleeping problems through simple daily behavior changes. Finally, the workshop will motivate patients by clarifying the links between quality sleep and their goals of heart-healthy living.   Recognizing and Reducing Stress Clinical staff led group instruction and group discussion with PowerPoint presentation and patient guidebook. To enhance the learning environment the use of posters, models and videos may be added. At the conclusion of this workshop, patients will be able to understand the types of stress reactions, differentiate between acute and chronic stress, and recognize the impact that chronic stress has on their health. They will also be able to apply different coping mechanisms, such as reframing negative self-talk. Patients will have the opportunity to practice a variety of stress management techniques, such as deep abdominal breathing, progressive muscle relaxation, and/or guided imagery.  The purpose of this lesson is to educate patients on the role of stress in their lives and to provide healthy techniques  for coping with it.  Learning Barriers/Preferences:  Learning Barriers/Preferences - 06/29/24 1347       Learning Barriers/Preferences   Learning Barriers Exercise Concerns;Sight   Cardale has neuropathy and has had intermitent dizziness at times. Khing wears reading glasses   Learning Preferences Pictoral;Skilled Demonstration          Education Topics:  Knowledge Questionnaire Score:  Knowledge Questionnaire Score - 06/29/24 1440       Knowledge Questionnaire Score   Pre Score 17/24          Core Components/Risk Factors/Patient Goals at Admission:  Personal Goals and Risk Factors at Admission - 06/29/24 1155       Core Components/Risk Factors/Patient Goals on Admission   Hypertension Yes    Intervention Provide education on lifestyle modifcations including regular physical activity/exercise, weight management, moderate sodium restriction and increased consumption of fresh fruit, vegetables, and low fat dairy, alcohol moderation, and smoking cessation.;Monitor prescription use compliance.    Expected Outcomes Short Term: Continued assessment and intervention until BP is < 140/10mm HG in hypertensive participants. < 130/83mm HG in hypertensive participants with diabetes, heart failure or chronic kidney disease.;Long Term: Maintenance of blood pressure at goal levels.    Lipids Yes    Intervention Provide education and support for participant on nutrition & aerobic/resistive exercise along with prescribed medications to achieve LDL 70mg , HDL >40mg .    Expected Outcomes Short Term: Participant states understanding of desired cholesterol values and is compliant with medications prescribed. Participant is following exercise prescription and nutrition guidelines.;Long Term: Cholesterol controlled with medications as prescribed, with individualized exercise RX and with personalized nutrition plan. Value goals: LDL < 70mg , HDL > 40 mg.          Core Components/Risk Factors/Patient  Goals Review:    Core Components/Risk Factors/Patient Goals at Discharge (Final Review):    ITP Comments:  ITP Comments     Row Name 06/29/24 1040           ITP Comments Dr Wilbert Bihari Medical Director. Pritikin Education Program/ Intensive cardiac rehab. Initial Orientation packet reviewed with the patient.          Comments: Fenix attended orientation for the cardiac rehabilitation program on  06/29/2024  to perform initial intake and exercise walk test. He was introduced to  the [employer] education and orientation packet was reviewed. Completed 6-minute step test, measurements, initial ITP, and exercise prescription. Vital signs stable. Telemetry-normal sinus rhythm, asymptomatic.   Service time was from 1021 to 1223. Arnoldo CHRISTELLA Gal, MS, ACSM CEP 06/29/24 1443

## 2024-07-05 ENCOUNTER — Encounter (HOSPITAL_COMMUNITY)
Admission: RE | Admit: 2024-07-05 | Discharge: 2024-07-05 | Disposition: A | Discharge status: Home / Self Care | Source: Ambulatory Visit | Attending: Cardiology | Admitting: Cardiology

## 2024-07-05 DIAGNOSIS — I214 Non-ST elevation (NSTEMI) myocardial infarction: Secondary | ICD-10-CM | POA: Diagnosis not present

## 2024-07-05 DIAGNOSIS — Z951 Presence of aortocoronary bypass graft: Secondary | ICD-10-CM

## 2024-07-05 NOTE — Progress Notes (Signed)
 Daily Session Note  Patient Details  Name: Jesse [name] MRN: [id number] Date of Birth: 09-22-1941 Referring Provider:   Flowsheet Row INTENSIVE CARDIAC REHAB ORIENT from 06/29/2024 in [facility]  Referring Provider Jesse Heinz, MD    Encounter Date: 07/05/2024  Check In:  Session Check In - 07/05/24 1011       Check-In   Supervising physician immediately available to respond to emergencies [facility] MD immediately available    Physician(s) Jesse Fabry, PA-C    Location [facility]    Staff Present Jesse Quan, RN, Jesse Gal, MS, ACSM-CEP, Exercise Physiologist;Jesse Fayette, MS, Exercise Physiologist;Jesse Janann, MS, ACSM-CEP, CCRP, Exercise Physiologist;Jesse Lennon, RN, BSN    Virtual Visit No    Medication changes reported     No    Fall or balance concerns reported    No    Tobacco Cessation No Change    Warm-up and Cool-down Performed as group-led instruction    Resistance Training Performed Yes    VAD Patient? No    PAD/SET Patient? No      Pain Assessment   Currently in Pain? No/denies    Pain Score 0-No pain    Multiple Pain Sites No          Capillary Blood Glucose: No results found for this or any previous visit (from the past 24 hours).   Exercise Prescription Changes - 07/05/24 1028       Response to Exercise   Blood Pressure (Admit) 122/82    Blood Pressure (Exercise) 110/60    Blood Pressure (Exit) 132/78    Heart Rate (Admit) 86 bpm    Heart Rate (Exercise) 93 bpm    Heart Rate (Exit) 81 bpm    Rating of Perceived Exertion (Exercise) 11    Symptoms None    Comments Off to a fiar start with exercise.    Duration Progress to 30 minutes of  aerobic without signs/symptoms of physical distress      Progression   Progression Continue to progress workloads to maintain intensity without signs/symptoms of physical distress.    Average METs 1.9      Resistance  Training   Training Prescription No    Weight Patient tired, no weights today.      Interval Training   Interval Training No      NuStep   Level 1    SPM 69    Minutes 25    METs 1.9          Social History   Tobacco Use  Smoking Status Never  Smokeless Tobacco Never  Tobacco Comments   never used tobacco    Goals Met:  Exercise tolerated well No report of concerns or symptoms today  Goals Unmet:  Not Applicable  Comments: Pt started cardiac rehab today.  Pt tolerated light exercise without difficulty. VSS, telemetry-Sinus Rhythm t wave inversion, asymptomatic.  Medication list reconciled. Pt denies barriers to medicaiton compliance.  PSYCHOSOCIAL ASSESSMENT:  PHQ-7. Pt lives with his sister who has a care giver during the say. Jesse [name] says he eats out has low energy and a poor appetite.   Pt enjoys golf.   Pt oriented to exercise equipment and routine.    Understanding verbalized. Patient did not stay for weights. Davis plans to attend exercise classes only.Jesse Elpidio Quan RN BSN    Dr. Wilbert [name] is Medical Director for Cardiac Rehab at [facility].

## 2024-07-07 ENCOUNTER — Encounter: Payer: Self-pay | Admitting: Medical Oncology

## 2024-07-07 ENCOUNTER — Inpatient Hospital Stay: Attending: Family

## 2024-07-07 ENCOUNTER — Inpatient Hospital Stay: Admitting: Medical Oncology

## 2024-07-07 ENCOUNTER — Inpatient Hospital Stay

## 2024-07-07 VITALS — BP 109/59 | HR 75 | Temp 97.4°F | Resp 18 | Ht 65.0 in | Wt 134.0 lb

## 2024-07-07 DIAGNOSIS — G8929 Other chronic pain: Secondary | ICD-10-CM | POA: Diagnosis not present

## 2024-07-07 DIAGNOSIS — D751 Secondary polycythemia: Secondary | ICD-10-CM | POA: Insufficient documentation

## 2024-07-07 DIAGNOSIS — D45 Polycythemia vera: Secondary | ICD-10-CM | POA: Insufficient documentation

## 2024-07-07 DIAGNOSIS — D500 Iron deficiency anemia secondary to blood loss (chronic): Secondary | ICD-10-CM

## 2024-07-07 DIAGNOSIS — M549 Dorsalgia, unspecified: Secondary | ICD-10-CM | POA: Insufficient documentation

## 2024-07-07 LAB — CMP (CANCER CENTER ONLY)
ALT: 13 U/L (ref 0–44)
AST: 25 U/L (ref 15–41)
Albumin: 3.9 g/dL (ref 3.5–5.0)
Alkaline Phosphatase: 80 U/L (ref 38–126)
Anion gap: 11 (ref 5–15)
BUN: 11 mg/dL (ref 8–23)
CO2: 25 mmol/L (ref 22–32)
Calcium: 10.2 mg/dL (ref 8.9–10.3)
Chloride: 105 mmol/L (ref 98–111)
Creatinine: 0.66 mg/dL (ref 0.61–1.24)
GFR, Estimated: >60 mL/min (ref >60)
Glucose, Bld: 109 mg/dL — ABNORMAL HIGH (ref 70–99)
Potassium: 3.9 mmol/L (ref 3.5–5.1)
Sodium: 141 mmol/L (ref 135–145)
Total Bilirubin: 1 mg/dL (ref 0.0–1.2)
Total Protein: 6.7 g/dL (ref 6.5–8.1)

## 2024-07-07 LAB — CBC WITH DIFFERENTIAL (CANCER CENTER ONLY)
Abs Immature Granulocytes: 0.01 K/uL (ref 0.00–0.07)
Basophils Absolute: 0.1 K/uL (ref 0.0–0.1)
Basophils Relative: 1 %
Eosinophils Absolute: 0.2 K/uL (ref 0.0–0.5)
Eosinophils Relative: 2 %
HCT: 41.7 % (ref 39.0–52.0)
Hemoglobin: 13.1 g/dL (ref 13.0–17.0)
Immature Granulocytes: 0 %
Lymphocytes Relative: 30 %
Lymphs Abs: 2 K/uL (ref 0.7–4.0)
MCH: 28.1 pg (ref 26.0–34.0)
MCHC: 31.4 g/dL (ref 30.0–36.0)
MCV: 89.5 fL (ref 80.0–100.0)
Monocytes Absolute: 0.7 K/uL (ref 0.1–1.0)
Monocytes Relative: 11 %
Neutro Abs: 3.6 K/uL (ref 1.7–7.7)
Neutrophils Relative %: 56 %
Platelet Count: 319 K/uL (ref 150–400)
RBC: 4.66 MIL/uL (ref 4.22–5.81)
RDW: 17.6 % — ABNORMAL HIGH (ref 11.5–15.5)
WBC Count: 6.5 K/uL (ref 4.0–10.5)
nRBC: 0 % (ref 0.0–0.2)

## 2024-07-07 LAB — FERRITIN: Ferritin: 34 ng/mL (ref 24–336)

## 2024-07-07 LAB — IRON AND IRON BINDING CAPACITY (CC-WL,HP ONLY)
Iron: 38 ug/dL — ABNORMAL LOW (ref 45–182)
Saturation Ratios: 10 % — ABNORMAL LOW (ref 17.9–39.5)
TIBC: 392 ug/dL (ref 250–450)
UIBC: 354 ug/dL

## 2024-07-07 NOTE — Progress Notes (Signed)
 Hematology and Oncology Follow Up Visit  Jesse [name] [id number] December 05, 1940 83 y.o. 07/07/2024   Principle Diagnosis:  Polycythemia - JAK2 negative   Current Therapy:        Phlebotomy to maintain hematocrit below 45% Aspirin  81 mg by mouth daily   Interim History:  Jesse [name] is here today for follow-up and consideration of a phlebotomy.  He reports that since his last visit he had a heart attack. He underwent CABG x 3 on 04/11/2024. He has been doing well since for which he is very grateful.   He continues to have chronic back pain which is stable.   He has had no issues with nausea or vomiting.  He has had no change in bowel or bladder habits.  He has had no bleeding.  Has had no rashes.  Overall, I would say his performance status is probably ECOG 1.   Wt Readings from Last 3 Encounters:  07/07/24 134 lb 0.6 oz (60.8 kg)  06/29/24 136 lb 0.4 oz (61.7 kg)  06/04/24 128 lb 14.4 oz (58.5 kg)    Medications:  Allergies as of 07/07/2024       Reactions   Hctz [hydrochlorothiazide ] Other (See Comments)   Hyponatremia ; Na+ 111, w weakness resulting in mechanical fall   Tramadol Nausea And Vomiting        Medication List        Accurate as of July 07, 2024 11:11 AM. If you have any questions, ask your nurse or doctor.          ALPRAZolam  1 MG tablet Commonly known as: XANAX  Take 0.5 tablets (0.5 mg total) by mouth at bedtime.   aspirin  EC 325 MG tablet Take 1 tablet (325 mg total) by mouth daily.   Cholecalciferol  50 MCG (2000 UT) Caps Take 2,000 Units by mouth daily.   fish oil-omega-3 fatty acids 1000 MG capsule Take 1 g by mouth every other day.   gabapentin  100 MG capsule Commonly known as: NEURONTIN  Take 2 capsules (200 mg total) by mouth 3 (three) times daily.   metoprolol  tartrate 25 MG tablet Commonly known as: LOPRESSOR  Take 1 tablet (25 mg total) by mouth 2 (two) times daily.   midodrine  10 MG tablet Commonly known as:  PROAMATINE  Take 1 tablet (10 mg total) by mouth 3 (three) times daily with meals.   naproxen  500 MG tablet Commonly known as: NAPROSYN  TAKE 1 TABLET BY MOUTH TWICE A DAY WITH FOOD   oxyCODONE  5 MG immediate release tablet Commonly known as: Oxy IR/ROXICODONE  Take 1 tablet (5 mg total) by mouth every 6 (six) hours as needed for severe pain (pain score 7-10).   sertraline 25 MG tablet Commonly known as: ZOLOFT 1 tablet Orally Once a day; Duration: 30 days   simvastatin  20 MG tablet Commonly known as: ZOCOR  Take 20 mg by mouth at bedtime.   tamsulosin  0.4 MG Caps capsule Commonly known as: FLOMAX  Take 0.4 mg by mouth daily.   vitamin B-12 100 MCG tablet Commonly known as: CYANOCOBALAMIN  Take 100 mcg by mouth daily.        Allergies:  Allergies  Allergen Reactions   Hctz [Hydrochlorothiazide ] Other (See Comments)    Hyponatremia ; Na+ 111, w weakness resulting in mechanical fall   Tramadol Nausea And Vomiting    Past Medical History, Surgical history, Social history, and Family History were reviewed and updated.  Review of Systems: Review of Systems  Constitutional: Negative.   HENT: Negative.  Eyes: Negative.   Respiratory: Negative.    Cardiovascular: Negative.   Gastrointestinal: Negative.   Genitourinary: Negative.   Musculoskeletal:  Positive for back pain.  Skin: Negative.   Neurological:  Positive for sensory change.  Endo/Heme/Allergies: Negative.   Psychiatric/Behavioral: Negative.       Physical Exam:  height is 5' 5 (1.651 m) and weight is 134 lb 0.6 oz (60.8 kg). His oral temperature is 97.4 F (36.3 C) (abnormal). His blood pressure is 109/59 (abnormal) and his pulse is 75. His respiration is 18 and oxygen saturation is 100%.   Wt Readings from Last 3 Encounters:  07/07/24 134 lb 0.6 oz (60.8 kg)  06/29/24 136 lb 0.4 oz (61.7 kg)  06/04/24 128 lb 14.4 oz (58.5 kg)    Physical Exam Vitals reviewed.  HENT:     Head: Normocephalic and  atraumatic.  Eyes:     Pupils: Pupils are equal, round, and reactive to light.  Cardiovascular:     Rate and Rhythm: Normal rate and regular rhythm.     Heart sounds: Normal heart sounds.  Pulmonary:     Effort: Pulmonary effort is normal.     Breath sounds: Normal breath sounds.  Abdominal:     General: Bowel sounds are normal.     Palpations: Abdomen is soft.  Musculoskeletal:        General: No tenderness or deformity. Normal range of motion.     Cervical back: Normal range of motion.  Lymphadenopathy:     Cervical: No cervical adenopathy.  Skin:    General: Skin is warm and dry.     Findings: No erythema or rash.  Neurological:     Mental Status: He is alert and oriented to person, place, and time.  Psychiatric:        Behavior: Behavior normal.        Thought Content: Thought content normal.        Judgment: Judgment normal.      Lab Results  Component Value Date   WBC 6.5 07/07/2024   HGB 13.1 07/07/2024   HCT 41.7 07/07/2024   MCV 89.5 07/07/2024   PLT 319 07/07/2024   Lab Results  Component Value Date   FERRITIN 38 04/06/2024   IRON 145 04/06/2024   TIBC 395 04/06/2024   UIBC 250 04/06/2024   IRONPCTSAT 37 04/06/2024   Lab Results  Component Value Date   RETICCTPCT 1.6 12/11/2021   RBC 4.66 07/07/2024   RETICCTABS 87.5 01/02/2011   No results found for: KPAFRELGTCHN, LAMBDASER, KAPLAMBRATIO No results found for: IGGSERUM, IGA, IGMSERUM No results found for: STEPHANY CARLOTA BENSON MARKEL EARLA JOANNIE DOC, MSPIKE, SPEI   Chemistry      Component Value Date/Time   NA 130 (L) 04/22/2024 0930   NA 136 08/19/2017 1149   NA 135 (L) 10/21/2016 1151   K 3.4 (L) 04/22/2024 0930   K 3.8 08/19/2017 1149   K 3.9 10/21/2016 1151   CL 101 04/22/2024 0930   CL 95 (L) 08/19/2017 1149   CO2 22 04/22/2024 0930   CO2 28 08/19/2017 1149   CO2 25 10/21/2016 1151   BUN 11 04/22/2024 0930   BUN 14 08/19/2017 1149   BUN  13.6 10/21/2016 1151   CREATININE 0.71 04/22/2024 0930   CREATININE 0.82 04/06/2024 1103   CREATININE 1.0 08/19/2017 1149   CREATININE 0.8 10/21/2016 1151      Component Value Date/Time   CALCIUM  9.1 04/22/2024 0930   CALCIUM  10.1 08/19/2017 1149  CALCIUM  10.3 10/21/2016 1151   ALKPHOS 44 04/10/2024 1255   ALKPHOS 68 08/19/2017 1149   ALKPHOS 68 10/21/2016 1151   AST 17 04/10/2024 1255   AST 13 (L) 04/06/2024 1103   AST 25 10/21/2016 1151   ALT 14 04/10/2024 1255   ALT 13 04/06/2024 1103   ALT 32 08/19/2017 1149   ALT 38 10/21/2016 1151   BILITOT 1.3 (H) 04/10/2024 1255   BILITOT 1.6 (H) 04/06/2024 1103   BILITOT 1.22 (H) 10/21/2016 1151     Encounter Diagnoses  Name Primary?   Polycythemia rubra vera (HCC) Yes   Iron deficiency anemia due to chronic blood loss    Impression and Plan: Mr. Ormond is a very pleasant 83 yo caucasian gentleman with polycythemia, JAK-2 negative.   His CBC today shows a Hgb of 13.1 and a HCT of 41.7. He is within target range and a phlebotomy is not indicated at this time.   He will continue to follow up with cardiology.   RTC 3 months APP, labs (CBC, CMP, ferritin, iron), Phlebotomy    Lauraine CHRISTELLA Dais, PA-C 10/15/202511:11 AM

## 2024-07-12 ENCOUNTER — Encounter (HOSPITAL_COMMUNITY)
Admission: RE | Admit: 2024-07-12 | Discharge: 2024-07-12 | Disposition: A | Discharge status: Home / Self Care | Source: Ambulatory Visit | Attending: Cardiology | Admitting: Cardiology

## 2024-07-12 DIAGNOSIS — I214 Non-ST elevation (NSTEMI) myocardial infarction: Secondary | ICD-10-CM | POA: Diagnosis not present

## 2024-07-12 DIAGNOSIS — Z951 Presence of aortocoronary bypass graft: Secondary | ICD-10-CM

## 2024-07-14 ENCOUNTER — Encounter (HOSPITAL_COMMUNITY)
Admission: RE | Admit: 2024-07-14 | Discharge: 2024-07-14 | Disposition: A | Source: Ambulatory Visit | Attending: Cardiology

## 2024-07-14 DIAGNOSIS — Z951 Presence of aortocoronary bypass graft: Secondary | ICD-10-CM

## 2024-07-14 DIAGNOSIS — I214 Non-ST elevation (NSTEMI) myocardial infarction: Secondary | ICD-10-CM | POA: Diagnosis not present

## 2024-07-19 ENCOUNTER — Encounter (HOSPITAL_COMMUNITY)
Admission: RE | Admit: 2024-07-19 | Discharge: 2024-07-19 | Disposition: A | Source: Ambulatory Visit | Attending: Cardiology

## 2024-07-19 DIAGNOSIS — I214 Non-ST elevation (NSTEMI) myocardial infarction: Secondary | ICD-10-CM

## 2024-07-19 DIAGNOSIS — Z951 Presence of aortocoronary bypass graft: Secondary | ICD-10-CM

## 2024-07-21 ENCOUNTER — Encounter (HOSPITAL_COMMUNITY)
Admission: RE | Admit: 2024-07-21 | Discharge: 2024-07-21 | Disposition: A | Source: Ambulatory Visit | Attending: Cardiology

## 2024-07-21 DIAGNOSIS — I214 Non-ST elevation (NSTEMI) myocardial infarction: Secondary | ICD-10-CM

## 2024-07-21 DIAGNOSIS — Z951 Presence of aortocoronary bypass graft: Secondary | ICD-10-CM

## 2024-07-26 ENCOUNTER — Encounter: Payer: Self-pay | Admitting: Radiology

## 2024-07-26 ENCOUNTER — Encounter (HOSPITAL_COMMUNITY)
Admission: RE | Admit: 2024-07-26 | Discharge: 2024-07-26 | Disposition: A | Discharge status: Home / Self Care | Source: Ambulatory Visit | Attending: Cardiology | Admitting: Cardiology

## 2024-07-26 DIAGNOSIS — Z951 Presence of aortocoronary bypass graft: Secondary | ICD-10-CM | POA: Diagnosis not present

## 2024-07-26 DIAGNOSIS — Z48812 Encounter for surgical aftercare following surgery on the circulatory system: Secondary | ICD-10-CM | POA: Insufficient documentation

## 2024-07-26 DIAGNOSIS — I214 Non-ST elevation (NSTEMI) myocardial infarction: Secondary | ICD-10-CM

## 2024-07-27 NOTE — Progress Notes (Signed)
 Cardiac Individual Treatment Plan  Patient Details  Name: Jesse [name] MRN: [id number] Date of Birth: [birth year] Referring Provider:   Flowsheet Row INTENSIVE CARDIAC REHAB ORIENT from 06/29/2024 in [facility]  Referring Provider Ladona Heinz, MD    Initial Encounter Date:  Flowsheet Row INTENSIVE CARDIAC REHAB ORIENT from 06/29/2024 in [facility]  Date 06/29/24    Visit Diagnosis: 04/10/24 NSTEMI  04/11/24 S/P CABG x 3  Patient's Home Medications on Admission:  Current Outpatient Medications:    ALPRAZolam  (XANAX ) 1 MG tablet, Take 0.5 tablets (0.5 mg total) by mouth at bedtime., Disp: 30 tablet, Rfl: 0   aspirin  EC 325 MG tablet, Take 1 tablet (325 mg total) by mouth daily., Disp: , Rfl:    Cholecalciferol  50 MCG (2000 UT) CAPS, Take 2,000 Units by mouth daily., Disp: , Rfl:    fish oil-omega-3 fatty acids 1000 MG capsule, Take 1 g by mouth every other day., Disp: , Rfl:    gabapentin  (NEURONTIN ) 100 MG capsule, Take 2 capsules (200 mg total) by mouth 3 (three) times daily., Disp: 180 capsule, Rfl: 2   metoprolol  tartrate (LOPRESSOR ) 25 MG tablet, Take 1 tablet (25 mg total) by mouth 2 (two) times daily., Disp: 180 tablet, Rfl: 3   midodrine  (PROAMATINE ) 10 MG tablet, Take 1 tablet (10 mg total) by mouth 3 (three) times daily with meals., Disp: , Rfl:    naproxen  (NAPROSYN ) 500 MG tablet, TAKE 1 TABLET BY MOUTH TWICE A DAY WITH FOOD, Disp: 60 tablet, Rfl: 1   oxyCODONE  (OXY IR/ROXICODONE ) 5 MG immediate release tablet, Take 1 tablet (5 mg total) by mouth every 6 (six) hours as [name] for severe pain (pain score 7-10)., Disp: 28 tablet, Rfl: 0   sertraline (ZOLOFT) 25 MG tablet, 1 tablet Orally Once a day; Duration: 30 days, Disp: , Rfl:    simvastatin  (ZOCOR ) 20 MG tablet, Take 20 mg by mouth at bedtime., Disp: , Rfl:    tamsulosin  (FLOMAX ) 0.4 MG CAPS capsule, Take  0.4 mg by mouth daily., Disp: , Rfl:    vitamin B-12 (CYANOCOBALAMIN ) 100 MCG tablet, Take 100 mcg by mouth daily., Disp: , Rfl:   Past Medical History: Past Medical History:  Diagnosis Date   Coronary artery disease    Depression    Hypercholesteremia    Hypertension    HZ (herpes zoster) 09/02/2011   Neuritis    left foot   Polycythemia rubra vera (HCC) 09/02/2011    Tobacco Use: Social History   Tobacco Use  Smoking Status Never  Smokeless Tobacco Never  Tobacco Comments   never used tobacco    Labs: Review Flowsheet       Latest Ref Rng & Units 04/10/2024 04/11/2024  Labs for ITP Cardiac and Pulmonary Rehab  Cholestrol 0 - 200 mg/dL 870  -  LDL (calc) 0 - 99 mg/dL 83  -  HDL-C >59 mg/dL 36  -  Trlycerides <849 mg/dL 51  -  Hemoglobin J8r 4.8 - 5.6 % 4.9  -  PH, Arterial 7.35 - 7.45 - 7.374  7.387  7.365  7.387  7.362  7.343   PCO2 arterial 32 - 48 mmHg - 34.1  30.5  34.9  32.1  39.6  32.3   Bicarbonate 20.0 - 28.0 mmol/L - 19.9  18.3  20.3  19.3  22.5  17.6   TCO2 22 - 32 mmol/L -  21  19  21  22  20  22  21  24  24  19  20  19    Acid-base deficit 0.0 - 2.0 mmol/L - 5.0  6.0  5.0  5.0  3.0  7.0   O2 Saturation % - 98  99  97  100  100  100     Details       Multiple values from one day are sorted in reverse-chronological order         Capillary Blood Glucose: Lab Results  Component Value Date   GLUCAP 124 (H) 04/22/2024   GLUCAP 111 (H) 04/13/2024   GLUCAP 102 (H) 04/13/2024   GLUCAP 110 (H) 04/12/2024   GLUCAP 122 (H) 04/12/2024     Exercise Target Goals: Exercise Program Goal: Individual exercise prescription set using results from initial 6 min walk test and THRR while considering  patient's activity barriers and safety.   Exercise Prescription Goal: Initial exercise prescription builds to 30-45 minutes a day of aerobic activity, 2-3 days per week.  Home exercise guidelines Jesse be given to patient during program as part of exercise  prescription that the participant Jesse acknowledge.  Activity Barriers & Risk Stratification:  Activity Barriers & Cardiac Risk Stratification - 06/29/24 1155       Activity Barriers & Cardiac Risk Stratification   Activity Barriers History of Falls;Assistive Device;Back Problems;Balance Concerns;Other (comment)    Comments Neuropathy low back and down left leg with numbness.    Cardiac Risk Stratification High          6 Minute Walk:  6 Minute Walk     Row Name 06/29/24 1202         6 Minute Walk   Phase Initial     Distance 1108.8 feet     Walk Time 6 minutes     # of Rest Breaks 0     MPH 2.1     METS 2     RPE 11     Perceived Dyspnea  1     VO2 Peak 6.98     Symptoms Yes (comment)     Comments Mild shortness of breath.     Resting HR 70 bpm     Resting BP 114/72     Resting Oxygen Saturation  97 %     Exercise Oxygen Saturation  during 6 min walk 100 %     Max Ex. HR 79 bpm     Max Ex. BP 140/76     2 Minute Post BP 142/72        Oxygen Initial Assessment:   Oxygen Re-Evaluation:   Oxygen Discharge (Final Oxygen Re-Evaluation):   Initial Exercise Prescription:  Initial Exercise Prescription - 06/29/24 1400       Date of Initial Exercise RX and Referring Provider   Date 06/29/24    Referring Provider Ladona Heinz, MD    Expected Discharge Date 09/22/24      NuStep   Level 1    SPM 65    Minutes 25    METs 2      Prescription Details   Frequency (times per week) 3    Duration Progress to 30 minutes of continuous aerobic without signs/symptoms of physical distress      Intensity   THRR 40-80% of Max Heartrate 56-112    Ratings of Perceived Exertion 11-13    Perceived Dyspnea 0-4      Progression   Progression Continue to progress  workloads to maintain intensity without signs/symptoms of physical distress.      Resistance Training   Training Prescription Yes    Weight 2 lbs    Reps 10-15          Perform Capillary Blood Glucose  checks as [name].  Exercise Prescription Changes:   Exercise Prescription Changes     Row Name 07/05/24 1028 07/12/24 1031 07/26/24 1032         Response to Exercise   Blood Pressure (Admit) 122/82 120/70 130/72     Blood Pressure (Exercise) 110/60 110/58 144/78     Blood Pressure (Exit) 132/78 112/72 122/68     Heart Rate (Admit) 86 bpm 71 bpm 87 bpm     Heart Rate (Exercise) 93 bpm 89 bpm 85 bpm     Heart Rate (Exit) 81 bpm 80 bpm 70 bpm     Rating of Perceived Exertion (Exercise) 11 11 11      Symptoms None None None     Comments Off to a fiar start with exercise. -- --     Duration Progress to 30 minutes of  aerobic without signs/symptoms of physical distress Progress to 30 minutes of  aerobic without signs/symptoms of physical distress Progress to 30 minutes of  aerobic without signs/symptoms of physical distress     Intensity -- THRR unchanged THRR unchanged       Progression   Progression Continue to progress workloads to maintain intensity without signs/symptoms of physical distress. Continue to progress workloads to maintain intensity without signs/symptoms of physical distress. Continue to progress workloads to maintain intensity without signs/symptoms of physical distress.     Average METs 1.9 1.7 1.9       Resistance Training   Training Prescription No Yes No     Weight Patient tired, no weights today. 2 lbs --     Reps -- 10-15 --     Time -- 5 Minutes --       Interval Training   Interval Training No No No       NuStep   Level 1 1 1      SPM 69 55 63     Minutes 25 25 25      METs 1.9 1.7 1.9        Exercise Comments:   Exercise Comments     Row Name 07/05/24 1117           Exercise Comments Spiro tolerated low intensity exercise fair, very deconditioned. Seated rest breaks taken during the warm-up stretches. He [name] fatigued after the aerobic exercise, no weights performed today.          Exercise Goals and Review:   Exercise Goals     Row Name  06/29/24 1155             Exercise Goals   Increase Physical Activity Yes       Intervention Provide advice, education, support and counseling about physical activity/exercise needs.;Develop an individualized exercise prescription for aerobic and resistive training based on initial evaluation findings, risk stratification, comorbidities and participant's personal goals.       Expected Outcomes Short Term: Attend rehab on a regular basis to increase amount of physical activity.;Long Term: Exercising regularly at least 3-5 days a week.;Long Term: Add in home exercise to make exercise part of routine and to increase amount of physical activity.       Increase Strength and Stamina Yes       Intervention Provide advice, education, support  and counseling about physical activity/exercise needs.;Develop an individualized exercise prescription for aerobic and resistive training based on initial evaluation findings, risk stratification, comorbidities and participant's personal goals.       Expected Outcomes Short Term: Increase workloads from initial exercise prescription for resistance, speed, and METs.;Short Term: Perform resistance training exercises routinely during rehab and add in resistance training at home;Long Term: Improve cardiorespiratory fitness, muscular endurance and strength as measured by increased METs and functional capacity ( )       Able to understand and use rate of perceived exertion (RPE) scale Yes       Intervention Provide education and explanation on how to use RPE scale       Expected Outcomes Short Term: Able to use RPE daily in rehab to express subjective intensity level;Long Term:  Able to use RPE to guide intensity level when exercising independently       Understanding of Exercise Prescription Yes       Intervention Provide education, explanation, and written materials on patient's individual exercise prescription       Expected Outcomes Short Term: Able to explain program  exercise prescription;Long Term: Able to explain home exercise prescription to exercise independently          Exercise Goals Re-Evaluation :  Exercise Goals Re-Evaluation     Row Name 07/05/24 1117             Exercise Goal Re-Evaluation   Exercise Goals Review Increase Physical Activity;Increase Strength and Stamina;Able to understand and use rate of perceived exertion (RPE) scale       Comments Jesse [name] able to understand and use RPE scale appropriately.       Expected Outcomes Progress duration to 30 minutes and increase workloads as tolerated to help increase strength and stamina.          Discharge Exercise Prescription (Final Exercise Prescription Changes):  Exercise Prescription Changes - 07/26/24 1032       Response to Exercise   Blood Pressure (Admit) 130/72    Blood Pressure (Exercise) 144/78    Blood Pressure (Exit) 122/68    Heart Rate (Admit) 87 bpm    Heart Rate (Exercise) 85 bpm    Heart Rate (Exit) 70 bpm    Rating of Perceived Exertion (Exercise) 11    Symptoms None    Duration Progress to 30 minutes of  aerobic without signs/symptoms of physical distress    Intensity THRR unchanged      Progression   Progression Continue to progress workloads to maintain intensity without signs/symptoms of physical distress.    Average METs 1.9      Resistance Training   Training Prescription No      Interval Training   Interval Training No      NuStep   Level 1    SPM 63    Minutes 25    METs 1.9          Nutrition:  Target Goals: Understanding of nutrition guidelines, daily intake of sodium 1500mg , cholesterol 200mg , calories 30% from fat and 7% or less from saturated fats, daily to have 5 or more servings of fruits and vegetables.  Biometrics:  Pre Biometrics - 06/29/24 1037       Pre Biometrics   Waist Circumference 36.5 inches    Hip Circumference 37.25 inches    Waist to Hip Ratio 0.98 %    Triceps Skinfold 9 mm    % Body Fat 23 %     Grip  Strength 10 kg    Flexibility --   Not performed due to back issues.   Single Leg Stand 4.52 seconds           Nutrition Therapy Plan and Nutrition Goals:   Nutrition Assessments:  MEDIFICTS Score Key: >=70 Need to make dietary changes  40-70 Heart Healthy Diet <= 40 Therapeutic Level Cholesterol Diet    Picture Your Plate Scores: <59 Unhealthy dietary pattern with much room for improvement. 41-50 Dietary pattern unlikely to meet recommendations for good health and room for improvement. 51-60 More healthful dietary pattern, with some room for improvement.  >60 Healthy dietary pattern, although there may be some specific behaviors that could be improved.    Nutrition Goals Re-Evaluation:   Nutrition Goals Re-Evaluation:   Nutrition Goals Discharge (Final Nutrition Goals Re-Evaluation):   Psychosocial: Target Goals: Acknowledge presence or absence of significant depression and/or stress, maximize coping skills, provide positive support system. Participant is able to verbalize types and ability to use techniques and skills [name] for reducing stress and depression.  Initial Review & Psychosocial Screening:  Initial Psych Review & Screening - 06/29/24 1305       Initial Review   Current issues with Current Depression;History of Depression;Current Anxiety/Panic;Current Stress Concerns;Current Psychotropic Meds    Source of Stress Concerns Family    Comments Jesse [name] lives with his sister who is 22 years old. Jesse [name]'s sister has schizophrenia. Jesse [name]'s sister has a care giver who comes in and looks after her.      Family Dynamics   Good Support System? No   Wei says that he is alone. Mukhtar says he has a second sister who has parkinsons. Beacher did say he has a niece and a nephew he can rely on upon questioning.   Strains Illness and family care strain    Comments Jesse [name] is taking an antidepressant and antianxiety medication. Jesse [name] says he is not interested in receving  counseling at this time.      Barriers   Psychosocial barriers to participate in program The patient should benefit from training in stress management and relaxation.      Screening Interventions   Interventions To provide support and resources with identified psychosocial needs;Encouraged to exercise;Provide feedback about the scores to participant    Expected Outcomes Long Term Goal: Stressors or current issues are controlled or eliminated.;Long Term goal: The participant improves quality of Life and PHQ9 Scores as seen by post scores and/or verbalization of changes          Quality of Life Scores:  Quality of Life - 06/29/24 1440       Quality of Life   Select Quality of Life      Quality of Life Scores   Health/Function Pre 20.38 %    Socioeconomic Pre 21.42 %    Psych/Spiritual Pre 20.21 %    Family Pre 17 %    GLOBAL Pre 20.31 %         Scores of 19 and below usually indicate a poorer quality of life in these areas.  A difference of  2-3 points is a clinically meaningful difference.  A difference of 2-3 points in the total score of the Quality of Life Index has been associated with significant improvement in overall quality of life, self-image, physical symptoms, and general health in studies assessing change in quality of life.  PHQ-9: Review Flowsheet  More data exists      07/07/2024 06/29/2024 04/06/2024 01/21/2023 12/12/2022  Depression screen PHQ  2/9  Decreased Interest 0 2 0 0 2  Down, Depressed, Hopeless 0 0 0 0 1  PHQ - 2 Score 0 2 0 0 3  Altered sleeping 0 0 - - 1  Tired, decreased energy 1 2 - - 2  Change in appetite 1 2 - - 0  Feeling bad or failure about yourself  0 0 - - 0  Trouble concentrating 0 1 - - 0  Moving slowly or fidgety/restless 0 0 - - 0  Suicidal thoughts 0 0 - - 0  PHQ-9 Score 2 7 - - 6  Difficult doing work/chores - Somewhat difficult - - -   Interpretation of Total Score  Total Score Depression Severity:  1-4 = Minimal depression,  5-9 = Mild depression, 10-14 = Moderate depression, 15-19 = Moderately severe depression, 20-27 = Severe depression   Psychosocial Evaluation and Intervention:   Psychosocial Re-Evaluation:  Psychosocial Re-Evaluation     Row Name 07/05/24 1658 07/23/24 1643           Psychosocial Re-Evaluation   Current issues with Current Psychotropic Meds;Current Anxiety/Panic;Current Stress Concerns;Current Depression;History of Depression Current Psychotropic Meds;Current Anxiety/Panic;Current Stress Concerns;Current Depression;History of Depression      Comments Jesse [name] started cardiac rehab on 07/05/24. Katelyn did not voice any increased concerns on his first day of exercise. Pt lives with his sister who has a care giver during the day. Jesse [name] says he eats out has low energy and a poor appetite. Jesse [name] is not interested in attending any education classes at this time. Jesse [name] has not voiced any increased concerns or stressors during exercise at cardiac rehab      Expected Outcomes Jesse [name] Jesse have controlled of decreased depression upon completion of cardiac rehab. Jesse [name] Jesse have controlled of decreased depression upon completion of cardiac rehab.      Interventions Stress management education;Relaxation education;Encouraged to attend Cardiac Rehabilitation for the exercise Stress management education;Relaxation education;Encouraged to attend Cardiac Rehabilitation for the exercise      Continue Psychosocial Services  No Follow up required No Follow up required        Initial Review   Source of Stress Concerns Chronic Illness;Family Chronic Illness;Family      Comments Jesse [name], Jesse [name],         Psychosocial Discharge (Final Psychosocial Re-Evaluation):  Psychosocial Re-Evaluation - 07/23/24 1643       Psychosocial Re-Evaluation   Current issues with Current Psychotropic Meds;Current Anxiety/Panic;Current Stress  Concerns;Current Depression;History of Depression    Comments Jesse [name] has not voiced any increased concerns or stressors during exercise at cardiac rehab    Expected Outcomes Angus Jesse have controlled of decreased depression upon completion of cardiac rehab.    Interventions Stress management education;Relaxation education;Encouraged to attend Cardiac Rehabilitation for the exercise    Continue Psychosocial Services  No Follow up required      Initial Review   Source of Stress Concerns Chronic Illness;Family    Comments Jesse [name],          Vocational Rehabilitation: Provide vocational rehab assistance to qualifying candidates.   Vocational Rehab Evaluation & Intervention:  Vocational Rehab - 06/29/24 1347       Initial Vocational Rehab Evaluation & Intervention   Assessment shows need for Vocational Rehabilitation No   Jesse [name] is retired and does not need vocational rehab at this time.  Education: Education Goals: Education classes Jesse be provided on a weekly basis, covering required topics. Participant Jesse state understanding/return demonstration of topics presented.     Core Videos: Exercise    Move It!  Clinical staff conducted group or individual video education with verbal and written material and guidebook.  Patient learns the recommended Pritikin exercise program. Exercise with the goal of living a long, healthy life. Some of the health benefits of exercise include controlled diabetes, healthier blood pressure levels, improved cholesterol levels, improved heart and lung capacity, improved sleep, and better body composition. Everyone should speak with their doctor before starting or changing an exercise routine.  Biomechanical Limitations Clinical staff conducted group or individual video education with verbal and written material and guidebook.  Patient learns how biomechanical limitations can impact exercise and how we  can mitigate and possibly overcome limitations to have an impactful and balanced exercise routine.  Body Composition Clinical staff conducted group or individual video education with verbal and written material and guidebook.  Patient learns that body composition (ratio of muscle mass to fat mass) is a key component to assessing overall fitness, rather than body weight alone. Increased fat mass, especially visceral belly fat, can put us  at increased risk for metabolic syndrome, type 2 diabetes, heart disease, and even death. It is recommended to combine diet and exercise (cardiovascular and resistance training) to improve your body composition. Seek guidance from your physician and exercise physiologist before implementing an exercise routine.  Exercise Action Plan Clinical staff conducted group or individual video education with verbal and written material and guidebook.  Patient learns the recommended strategies to achieve and enjoy long-term exercise adherence, including variety, self-motivation, self-efficacy, and positive decision making. Benefits of exercise include fitness, good health, weight management, more energy, better sleep, less stress, and overall well-being.  Medical   Heart Disease Risk Reduction Clinical staff conducted group or individual video education with verbal and written material and guidebook.  Patient learns our heart is our most vital organ as it circulates oxygen, nutrients, white blood cells, and hormones throughout the entire body, and carries waste away. Data supports a plant-based eating plan like the Pritikin Program for its effectiveness in slowing progression of and reversing heart disease. The video provides a number of recommendations to address heart disease.   Metabolic Syndrome and Belly Fat  Clinical staff conducted group or individual video education with verbal and written material and guidebook.  Patient learns what metabolic syndrome is, how it leads  to heart disease, and how one can reverse it and keep it from coming back. You have metabolic syndrome if you have 3 of the following 5 criteria: abdominal obesity, high blood pressure, high triglycerides, low HDL cholesterol, and high blood sugar.  Hypertension and Heart Disease Clinical staff conducted group or individual video education with verbal and written material and guidebook.  Patient learns that high blood pressure, or hypertension, is very common in the [place] . Hypertension is largely due to excessive salt intake, but other important risk factors include being overweight, physical inactivity, drinking too much alcohol, smoking, and not eating enough potassium from fruits and vegetables. High blood pressure is a leading risk factor for heart attack, stroke, congestive heart failure, dementia, kidney failure, and premature death. Long-term effects of excessive salt intake include stiffening of the arteries and thickening of heart muscle and organ damage. Recommendations include ways to reduce hypertension and the risk of heart disease.  Diseases of Our Time - Focusing on Diabetes Clinical  staff conducted group or individual video education with verbal and written material and guidebook.  Patient learns why the best way to stop diseases of our time is prevention, through food and other lifestyle changes. Medicine (such as prescription pills and surgeries) is often only a Band-Aid on the problem, not a long-term solution. Most common diseases of our time include obesity, type 2 diabetes, hypertension, heart disease, and cancer. The Pritikin Program is recommended and has been proven to help reduce, reverse, and/or prevent the damaging effects of metabolic syndrome.  Nutrition   Overview of the Pritikin Eating Plan  Clinical staff conducted group or individual video education with verbal and written material and guidebook.  Patient learns about the Pritikin Eating Plan for disease risk  reduction. The Pritikin Eating Plan emphasizes a wide variety of unrefined, minimally-processed carbohydrates, like fruits, vegetables, whole grains, and legumes. Go, Caution, and Stop food choices are explained. Plant-based and lean animal proteins are emphasized. Rationale provided for low sodium intake for blood pressure control, low added sugars for blood sugar stabilization, and low added fats and oils for coronary artery disease risk reduction and weight management.  Calorie Density  Clinical staff conducted group or individual video education with verbal and written material and guidebook.  Patient learns about calorie density and how it impacts the Pritikin Eating Plan. Knowing the characteristics of the food you choose Jesse help you decide whether those foods Jesse lead to weight gain or weight loss, and whether you want to consume more or less of them. Weight loss is usually a side effect of the Pritikin Eating Plan because of its focus on low calorie-dense foods.  Label Reading  Clinical staff conducted group or individual video education with verbal and written material and guidebook.  Patient learns about the Pritikin recommended label reading guidelines and corresponding recommendations regarding calorie density, added sugars, sodium content, and whole grains.  Dining Out - Part 1  Clinical staff conducted group or individual video education with verbal and written material and guidebook.  Patient learns that restaurant meals can be sabotaging because they can be so high in calories, fat, sodium, and/or sugar. Patient learns recommended strategies on how to positively address this and avoid unhealthy pitfalls.  Facts on Fats  Clinical staff conducted group or individual video education with verbal and written material and guidebook.  Patient learns that lifestyle modifications can be just as effective, if not more so, as many medications for lowering your risk of heart disease. A  Pritikin lifestyle can help to reduce your risk of inflammation and atherosclerosis (cholesterol build-up, or plaque, in the artery walls). Lifestyle interventions such as dietary choices and physical activity address the cause of atherosclerosis. A review of the types of fats and their impact on blood cholesterol levels, along with dietary recommendations to reduce fat intake is also included.  Nutrition Action Plan  Clinical staff conducted group or individual video education with verbal and written material and guidebook.  Patient learns how to incorporate Pritikin recommendations into their lifestyle. Recommendations include planning and keeping personal health goals in mind as an important part of their success.  Healthy Mind-Set    Healthy Minds, Bodies, Hearts  Clinical staff conducted group or individual video education with verbal and written material and guidebook.  Patient learns how to identify when they are stressed. Video Jesse discuss the impact of that stress, as well as the many benefits of stress management. Patient Jesse also be introduced to stress management techniques. The way  we think, act, and feel has an impact on our hearts.  How Our Thoughts Can Heal Our Hearts  Clinical staff conducted group or individual video education with verbal and written material and guidebook.  Patient learns that negative thoughts can cause depression and anxiety. This can result in negative lifestyle behavior and serious health problems. Cognitive behavioral therapy is an effective method to help control our thoughts in order to change and improve our emotional outlook.  Additional Videos:  Exercise    Improving Performance  Clinical staff conducted group or individual video education with verbal and written material and guidebook.  Patient learns to use a non-linear approach by alternating intensity levels and lengths of time spent exercising to help burn more calories and lose more body fat.  Cardiovascular exercise helps improve heart health, metabolism, hormonal balance, blood sugar control, and recovery from fatigue. Resistance training improves strength, endurance, balance, coordination, reaction time, metabolism, and muscle mass. Flexibility exercise improves circulation, posture, and balance. Seek guidance from your physician and exercise physiologist before implementing an exercise routine and learn your capabilities and proper form for all exercise.  Introduction to Yoga  Clinical staff conducted group or individual video education with verbal and written material and guidebook.  Patient learns about yoga, a discipline of the coming together of mind, breath, and body. The benefits of yoga include improved flexibility, improved range of motion, better posture and core strength, increased lung function, weight loss, and positive self-image. Yoga's heart health benefits include lowered blood pressure, healthier heart rate, decreased cholesterol and triglyceride levels, improved immune function, and reduced stress. Seek guidance from your physician and exercise physiologist before implementing an exercise routine and learn your capabilities and proper form for all exercise.  Medical   Aging: Enhancing Your Quality of Life  Clinical staff conducted group or individual video education with verbal and written material and guidebook.  Patient learns key strategies and recommendations to stay in good physical health and enhance quality of life, such as prevention strategies, having an advocate, securing a Health Care Proxy and Power of Attorney, and keeping a list of medications and system for tracking them. It also discusses how to avoid risk for bone loss.  Biology of Weight Control  Clinical staff conducted group or individual video education with verbal and written material and guidebook.  Patient learns that weight gain occurs because we consume more calories than we burn (eating more,  moving less). Even if your body weight is normal, you may have higher ratios of fat compared to muscle mass. Too much body fat puts you at increased risk for cardiovascular disease, heart attack, stroke, type 2 diabetes, and obesity-related cancers. In addition to exercise, following the Pritikin Eating Plan can help reduce your risk.  Decoding Lab Results  Clinical staff conducted group or individual video education with verbal and written material and guidebook.  Patient learns that lab test reflects one measurement whose values change over time and are influenced by many factors, including medication, stress, sleep, exercise, food, hydration, pre-existing medical conditions, and more. It is recommended to use the knowledge from this video to become more involved with your lab results and evaluate your numbers to speak with your doctor.   Diseases of Our Time - Overview  Clinical staff conducted group or individual video education with verbal and written material and guidebook.  Patient learns that according to the CDC, 50% to 70% of chronic diseases (such as obesity, type 2 diabetes, elevated lipids, hypertension, and heart  disease) are avoidable through lifestyle improvements including healthier food choices, listening to satiety cues, and increased physical activity.  Sleep Disorders Clinical staff conducted group or individual video education with verbal and written material and guidebook.  Patient learns how good quality and duration of sleep are important to overall health and well-being. Patient also learns about sleep disorders and how they impact health along with recommendations to address them, including discussing with a physician.  Nutrition  Dining Out - Part 2 Clinical staff conducted group or individual video education with verbal and written material and guidebook.  Patient learns how to plan ahead and communicate in order to maximize their dining experience in a healthy and  nutritious manner. Included are recommended food choices based on the type of restaurant the patient is visiting.   Fueling a Banker conducted group or individual video education with verbal and written material and guidebook.  There is a strong connection between our food choices and our health. Diseases like obesity and type 2 diabetes are very prevalent and are in large-part due to lifestyle choices. The Pritikin Eating Plan provides plenty of food and hunger-curbing satisfaction. It is easy to follow, affordable, and helps reduce health risks.  Menu Workshop  Clinical staff conducted group or individual video education with verbal and written material and guidebook.  Patient learns that restaurant meals can sabotage health goals because they are often packed with calories, fat, sodium, and sugar. Recommendations include strategies to plan ahead and to communicate with the manager, chef, or server to help order a healthier meal.  Planning Your Eating Strategy  Clinical staff conducted group or individual video education with verbal and written material and guidebook.  Patient learns about the Pritikin Eating Plan and its benefit of reducing the risk of disease. The Pritikin Eating Plan does not focus on calories. Instead, it emphasizes high-quality, nutrient-rich foods. By knowing the characteristics of the foods, we choose, we can determine their calorie density and make informed decisions.  Targeting Your Nutrition Priorities  Clinical staff conducted group or individual video education with verbal and written material and guidebook.  Patient learns that lifestyle habits have a tremendous impact on disease risk and progression. This video provides eating and physical activity recommendations based on your personal health goals, such as reducing LDL cholesterol, losing weight, preventing or controlling type 2 diabetes, and reducing high blood pressure.  Vitamins and  Minerals  Clinical staff conducted group or individual video education with verbal and written material and guidebook.  Patient learns different ways to obtain key vitamins and minerals, including through a recommended healthy diet. It is important to discuss all supplements you take with your doctor.   Healthy Mind-Set    Smoking Cessation  Clinical staff conducted group or individual video education with verbal and written material and guidebook.  Patient learns that cigarette smoking and tobacco addiction pose a serious health risk which affects millions of people. Stopping smoking Jesse significantly reduce the risk of heart disease, lung disease, and many forms of cancer. Recommended strategies for quitting are covered, including working with your doctor to develop a successful plan.  Culinary   Becoming a Set Designer conducted group or individual video education with verbal and written material and guidebook.  Patient learns that cooking at home can be healthy, cost-effective, quick, and puts them in control. Keys to cooking healthy recipes Jesse include looking at your recipe, assessing your equipment needs, planning ahead, making it simple,  choosing cost-effective seasonal ingredients, and limiting the use of added fats, salts, and sugars.  Cooking - Breakfast and Snacks  Clinical staff conducted group or individual video education with verbal and written material and guidebook.  Patient learns how important breakfast is to satiety and nutrition through the entire day. Recommendations include key foods to eat during breakfast to help stabilize blood sugar levels and to prevent overeating at meals later in the day. Planning ahead is also a key component.  Cooking - Educational Psychologist conducted group or individual video education with verbal and written material and guidebook.  Patient learns eating strategies to improve overall health, including an approach  to cook more at home. Recommendations include thinking of animal protein as a side on your plate rather than center stage and focusing instead on lower calorie dense options like vegetables, fruits, whole grains, and plant-based proteins, such as beans. Making sauces in large quantities to freeze for later and leaving the skin on your vegetables are also recommended to maximize your experience.  Cooking - Healthy Salads and Dressing Clinical staff conducted group or individual video education with verbal and written material and guidebook.  Patient learns that vegetables, fruits, whole grains, and legumes are the foundations of the Pritikin Eating Plan. Recommendations include how to incorporate each of these in flavorful and healthy salads, and how to create homemade salad dressings. Proper handling of ingredients is also covered. Cooking - Soups and [employer] - Soups and Desserts Clinical staff conducted group or individual video education with verbal and written material and guidebook.  Patient learns that Pritikin soups and desserts make for easy, nutritious, and delicious snacks and meal components that are low in sodium, fat, sugar, and calorie density, while high in vitamins, minerals, and filling fiber. Recommendations include simple and healthy ideas for soups and desserts.   Overview     The Pritikin Solution Program Overview Clinical staff conducted group or individual video education with verbal and written material and guidebook.  Patient learns that the results of the Pritikin Program have been documented in more than 100 articles published in peer-reviewed journals, and the benefits include reducing risk factors for (and, in some cases, even reversing) high cholesterol, high blood pressure, type 2 diabetes, obesity, and more! An overview of the three key pillars of the Pritikin Program Jesse be covered: eating well, doing regular exercise, and having a healthy  mind-set.  WORKSHOPS  Exercise: Exercise Basics: Building Your Action Plan Clinical staff led group instruction and group discussion with PowerPoint presentation and patient guidebook. To enhance the learning environment the use of posters, models and videos may be added. At the conclusion of this workshop, patients Jesse comprehend the difference between physical activity and exercise, as well as the benefits of incorporating both, into their routine. Patients Jesse understand the FITT (Frequency, Intensity, Time, and Type) principle and how to use it to build an exercise action plan. In addition, safety concerns and other considerations for exercise and cardiac rehab Jesse be addressed by the presenter. The purpose of this lesson is to promote a comprehensive and effective weekly exercise routine in order to improve patients' overall level of fitness.   Managing Heart Disease: Your Path to a Healthier Heart Clinical staff led group instruction and group discussion with PowerPoint presentation and patient guidebook. To enhance the learning environment the use of posters, models and videos may be added.At the conclusion of this workshop, patients Jesse understand the anatomy and physiology  of the heart. Additionally, they Jesse understand how Pritikin's three pillars impact the risk factors, the progression, and the management of heart disease.  The purpose of this lesson is to provide a high-level overview of the heart, heart disease, and how the Pritikin lifestyle positively impacts risk factors.  Exercise Biomechanics Clinical staff led group instruction and group discussion with PowerPoint presentation and patient guidebook. To enhance the learning environment the use of posters, models and videos may be added. Patients Jesse learn how the structural parts of their bodies function and how these functions impact their daily activities, movement, and exercise. Patients Jesse learn how to promote a  neutral spine, learn how to manage pain, and identify ways to improve their physical movement in order to promote healthy living. The purpose of this lesson is to expose patients to common physical limitations that impact physical activity. Participants Jesse learn practical ways to adapt and manage aches and pains, and to minimize their effect on regular exercise. Patients Jesse learn how to maintain good posture while sitting, walking, and lifting.  Balance Training and Fall Prevention  Clinical staff led group instruction and group discussion with PowerPoint presentation and patient guidebook. To enhance the learning environment the use of posters, models and videos may be added. At the conclusion of this workshop, patients Jesse understand the importance of their sensorimotor skills (vision, proprioception, and the vestibular system) in maintaining their ability to balance as they age. Patients Jesse apply a variety of balancing exercises that are appropriate for their current level of function. Patients Jesse understand the common causes for poor balance, possible solutions to these problems, and ways to modify their physical environment in order to minimize their fall risk. The purpose of this lesson is to teach patients about the importance of maintaining balance as they age and ways to minimize their risk of falling.  WORKSHOPS   Nutrition:  Fueling a Ship Broker led group instruction and group discussion with PowerPoint presentation and patient guidebook. To enhance the learning environment the use of posters, models and videos may be added. Patients Jesse review the foundational principles of the Pritikin Eating Plan and understand what constitutes a serving size in each of the food groups. Patients Jesse also learn Pritikin-friendly foods that are better choices when away from home and review make-ahead meal and snack options. Calorie density Jesse be reviewed and applied to  three nutrition priorities: weight maintenance, weight loss, and weight gain. The purpose of this lesson is to reinforce (in a group setting) the key concepts around what patients are recommended to eat and how to apply these guidelines when away from home by planning and selecting Pritikin-friendly options. Patients Jesse understand how calorie density may be adjusted for different weight management goals.  Mindful Eating  Clinical staff led group instruction and group discussion with PowerPoint presentation and patient guidebook. To enhance the learning environment the use of posters, models and videos may be added. Patients Jesse briefly review the concepts of the Pritikin Eating Plan and the importance of low-calorie dense foods. The concept of mindful eating Jesse be introduced as well as the importance of paying attention to internal hunger signals. Triggers for non-hunger eating and techniques for dealing with triggers Jesse be explored. The purpose of this lesson is to provide patients with the opportunity to review the basic principles of the Pritikin Eating Plan, discuss the value of eating mindfully and how to measure internal cues of hunger and fullness using the Hunger Scale.  Patients Jesse also discuss reasons for non-hunger eating and learn strategies to use for controlling emotional eating.  Targeting Your Nutrition Priorities Clinical staff led group instruction and group discussion with PowerPoint presentation and patient guidebook. To enhance the learning environment the use of posters, models and videos may be added. Patients Jesse learn how to determine their genetic susceptibility to disease by reviewing their family history. Patients Jesse gain insight into the importance of diet as part of an overall healthy lifestyle in mitigating the impact of genetics and other environmental insults. The purpose of this lesson is to provide patients with the opportunity to assess their personal nutrition  priorities by looking at their family history, their own health history and current risk factors. Patients Jesse also be able to discuss ways of prioritizing and modifying the Pritikin Eating Plan for their highest risk areas  Menu  Clinical staff led group instruction and group discussion with PowerPoint presentation and patient guidebook. To enhance the learning environment the use of posters, models and videos may be added. Using menus brought in from [employer], or printed from [employer], patients Jesse apply the Pritikin dining out guidelines that were presented in the [employer] video. Patients Jesse also be able to practice these guidelines in a variety of provided scenarios. The purpose of this lesson is to provide patients with the opportunity to practice hands-on learning of the Pritikin Dining Out guidelines with actual menus and practice scenarios.  Label Reading Clinical staff led group instruction and group discussion with PowerPoint presentation and patient guidebook. To enhance the learning environment the use of posters, models and videos may be added. Patients Jesse review and discuss the Pritikin label reading guidelines presented in Pritikin's Label Reading Educational series video. Using fool labels brought in from local grocery stores and markets, patients Jesse apply the label reading guidelines and determine if the packaged food meet the Pritikin guidelines. The purpose of this lesson is to provide patients with the opportunity to review, discuss, and practice hands-on learning of the Pritikin Label Reading guidelines with actual packaged food labels. Cooking School  Pritikin's [employer] are designed to teach patients ways to prepare quick, simple, and affordable recipes at home. The importance of nutrition's role in chronic disease risk reduction is reflected in its emphasis in the overall Pritikin program. By learning how to prepare essential  core Pritikin Eating Plan recipes, patients Jesse increase control over what they eat; be able to customize the flavor of foods without the use of added salt, sugar, or fat; and improve the quality of the food they consume. By learning a set of core recipes which are easily assembled, quickly prepared, and affordable, patients are more likely to prepare more healthy foods at home. These workshops focus on convenient breakfasts, simple entres, side dishes, and desserts which can be prepared with minimal effort and are consistent with nutrition recommendations for cardiovascular risk reduction. Cooking [employer] are taught by a armed forces logistics/support/administrative officer (RD) who has been trained by the [employer]. The chef or RD has a clear understanding of the importance of minimizing - if not completely eliminating - added fat, sugar, and sodium in recipes. Throughout the series of Cooking School Workshop sessions, patients Jesse learn about healthy ingredients and efficient methods of cooking to build confidence in their capability to prepare    Cooking School weekly topics:  Adding Flavor- Sodium-Free  Fast and Healthy Breakfasts  Powerhouse Plant-Based Proteins  Satisfying  Salads and Dressings  Simple Sides and Sauces  International Cuisine-Spotlight on the [employer] Zones  Delicious Desserts  Savory Soups  [employer] - Meals in a Snap  Tasty Appetizers and Snacks  Comforting Weekend Breakfasts  One-Pot Wonders   Fast Evening Meals  Landscape Architect Your Pritikin Plate  WORKSHOPS   Healthy Mindset (Psychosocial):  Focused Goals, Sustainable Changes Clinical staff led group instruction and group discussion with PowerPoint presentation and patient guidebook. To enhance the learning environment the use of posters, models and videos may be added. Patients Jesse be able to apply effective goal setting strategies to establish at least one personal goal, and then take  consistent, meaningful action toward that goal. They Jesse learn to identify common barriers to achieving personal goals and develop strategies to overcome them. Patients Jesse also gain an understanding of how our mind-set can impact our ability to achieve goals and the importance of cultivating a positive and growth-oriented mind-set. The purpose of this lesson is to provide patients with a deeper understanding of how to set and achieve personal goals, as well as the tools and strategies [name] to overcome common obstacles which may arise along the way.  From Head to Heart: The Power of a Healthy Outlook  Clinical staff led group instruction and group discussion with PowerPoint presentation and patient guidebook. To enhance the learning environment the use of posters, models and videos may be added. Patients Jesse be able to recognize and describe the impact of emotions and mood on physical health. They Jesse discover the importance of self-care and explore self-care practices which may work for them. Patients Jesse also learn how to utilize the 4 C's to cultivate a healthier outlook and better manage stress and challenges. The purpose of this lesson is to demonstrate to patients how a healthy outlook is an essential part of maintaining good health, especially as they continue their cardiac rehab journey.  Healthy Sleep for a Healthy Heart Clinical staff led group instruction and group discussion with PowerPoint presentation and patient guidebook. To enhance the learning environment the use of posters, models and videos may be added. At the conclusion of this workshop, patients Jesse be able to demonstrate knowledge of the importance of sleep to overall health, well-being, and quality of life. They Jesse understand the symptoms of, and treatments for, common sleep disorders. Patients Jesse also be able to identify daytime and nighttime behaviors which impact sleep, and they Jesse be able to apply these tools to help  manage sleep-related challenges. The purpose of this lesson is to provide patients with a general overview of sleep and outline the importance of quality sleep. Patients Jesse learn about a few of the most common sleep disorders. Patients Jesse also be introduced to the concept of "sleep hygiene," and discover ways to self-manage certain sleeping problems through simple daily behavior changes. Finally, the workshop Jesse motivate patients by clarifying the links between quality sleep and their goals of heart-healthy living.   Recognizing and Reducing Stress Clinical staff led group instruction and group discussion with PowerPoint presentation and patient guidebook. To enhance the learning environment the use of posters, models and videos may be added. At the conclusion of this workshop, patients Jesse be able to understand the types of stress reactions, differentiate between acute and chronic stress, and recognize the impact that chronic stress has on their health. They Jesse also be able to apply different coping mechanisms, such as reframing negative self-talk. Patients Jesse have the opportunity to  practice a variety of stress management techniques, such as deep abdominal breathing, progressive muscle relaxation, and/or guided imagery.  The purpose of this lesson is to educate patients on the role of stress in their lives and to provide healthy techniques for coping with it.  Learning Barriers/Preferences:  Learning Barriers/Preferences - 06/29/24 1347       Learning Barriers/Preferences   Learning Barriers Exercise Concerns;Sight   Jesse [name] has neuropathy and has had intermitent dizziness at times. Jesse [name] wears reading glasses   Learning Preferences Pictoral;Skilled Demonstration          Education Topics:  Knowledge Questionnaire Score:  Knowledge Questionnaire Score - 06/29/24 1440       Knowledge Questionnaire Score   Pre Score 17/24          Core Components/Risk Factors/Patient Goals at  Admission:  Personal Goals and Risk Factors at Admission - 06/29/24 1155       Core Components/Risk Factors/Patient Goals on Admission   Hypertension Yes    Intervention Provide education on lifestyle modifcations including regular physical activity/exercise, weight management, moderate sodium restriction and increased consumption of fresh fruit, vegetables, and low fat dairy, alcohol moderation, and smoking cessation.;Monitor prescription use compliance.    Expected Outcomes Short Term: Continued assessment and intervention until BP is < 140/68mm HG in hypertensive participants. < 130/44mm HG in hypertensive participants with diabetes, heart failure or chronic kidney disease.;Long Term: Maintenance of blood pressure at goal levels.    Lipids Yes    Intervention Provide education and support for participant on nutrition & aerobic/resistive exercise along with prescribed medications to achieve LDL 70mg , HDL >40mg .    Expected Outcomes Short Term: Participant states understanding of desired cholesterol values and is compliant with medications prescribed. Participant is following exercise prescription and nutrition guidelines.;Long Term: Cholesterol controlled with medications as prescribed, with individualized exercise RX and with personalized nutrition plan. Value goals: LDL < 70mg , HDL > 40 mg.          Core Components/Risk Factors/Patient Goals Review:   Goals and Risk Factor Review     Row Name 07/08/24 0852 07/23/24 1644           Core Components/Risk Factors/Patient Goals Review   Personal Goals Review Weight Management/Obesity;Hypertension;Lipids Weight Management/Obesity;Hypertension;Lipids      Review Jesse [name] started cardiac rehab on 07/05/24. Jesse [name] did fair with exercise for his fitness level. Jesse [name] is only attending exercise classes. Jesse [name] is not interested in attending any education classes. Jesse [name] started cardiac rehab on 07/05/24. Jonatha is off to a fair start  with exercise for  his fitness level.      Expected Outcomes Jaydrian Jesse continue to participate in cardiac rehab for exercise, nutrition and lifestyle modifications Lysle Jesse continue to participate in cardiac rehab for exercise, nutrition and lifestyle modifications         Core Components/Risk Factors/Patient Goals at Discharge (Final Review):   Goals and Risk Factor Review - 07/23/24 1644       Core Components/Risk Factors/Patient Goals Review   Personal Goals Review Weight Management/Obesity;Hypertension;Lipids    Review Avraham started cardiac rehab on 07/05/24. Iverson is off to a fair start  with exercise for his fitness level.    Expected Outcomes Nicholus Jesse continue to participate in cardiac rehab for exercise, nutrition and lifestyle modifications          ITP Comments:  ITP Comments     Row Name 06/29/24 1040 07/05/24 1657 07/23/24 1642       ITP Comments Dr  Wilbert Bihari Medical Director. [facility] Education Program/ Intensive cardiac rehab. Initial Orientation packet reviewed with the patient. 30 Day ITP comments. Nancy started cardiac rehab on 07/05/24. Rayford did fair with exercise for his fitness level. 30 Day ITP comments. Fernandez started cardiac rehab on 07/05/24. Lamonta is off to a good start to exercise for his fitness level.        Comments: See ITP Comments

## 2024-07-28 ENCOUNTER — Encounter (HOSPITAL_COMMUNITY)
Admission: RE | Admit: 2024-07-28 | Discharge: 2024-07-28 | Disposition: A | Source: Ambulatory Visit | Attending: Cardiology

## 2024-07-28 DIAGNOSIS — I214 Non-ST elevation (NSTEMI) myocardial infarction: Secondary | ICD-10-CM

## 2024-07-28 DIAGNOSIS — Z951 Presence of aortocoronary bypass graft: Secondary | ICD-10-CM

## 2024-07-28 DIAGNOSIS — Z48812 Encounter for surgical aftercare following surgery on the circulatory system: Secondary | ICD-10-CM | POA: Diagnosis not present

## 2024-08-02 ENCOUNTER — Encounter (HOSPITAL_COMMUNITY)
Admission: RE | Admit: 2024-08-02 | Discharge: 2024-08-02 | Disposition: A | Discharge status: Home / Self Care | Source: Ambulatory Visit | Attending: Cardiology | Admitting: Cardiology

## 2024-08-02 DIAGNOSIS — Z951 Presence of aortocoronary bypass graft: Secondary | ICD-10-CM

## 2024-08-02 DIAGNOSIS — Z48812 Encounter for surgical aftercare following surgery on the circulatory system: Secondary | ICD-10-CM | POA: Diagnosis not present

## 2024-08-02 DIAGNOSIS — I214 Non-ST elevation (NSTEMI) myocardial infarction: Secondary | ICD-10-CM

## 2024-08-04 ENCOUNTER — Encounter (HOSPITAL_COMMUNITY)
Admission: RE | Admit: 2024-08-04 | Discharge: 2024-08-04 | Disposition: A | Discharge status: Home / Self Care | Source: Ambulatory Visit | Attending: Cardiology | Admitting: Cardiology

## 2024-08-04 DIAGNOSIS — Z48812 Encounter for surgical aftercare following surgery on the circulatory system: Secondary | ICD-10-CM | POA: Diagnosis not present

## 2024-08-04 DIAGNOSIS — Z951 Presence of aortocoronary bypass graft: Secondary | ICD-10-CM

## 2024-08-04 DIAGNOSIS — I214 Non-ST elevation (NSTEMI) myocardial infarction: Secondary | ICD-10-CM

## 2024-08-07 ENCOUNTER — Other Ambulatory Visit: Payer: Self-pay | Admitting: Orthopedic Surgery

## 2024-08-09 ENCOUNTER — Encounter (HOSPITAL_COMMUNITY)
Admission: RE | Admit: 2024-08-09 | Discharge: 2024-08-09 | Disposition: A | Source: Ambulatory Visit | Attending: Cardiology

## 2024-08-09 DIAGNOSIS — I214 Non-ST elevation (NSTEMI) myocardial infarction: Secondary | ICD-10-CM

## 2024-08-09 DIAGNOSIS — Z48812 Encounter for surgical aftercare following surgery on the circulatory system: Secondary | ICD-10-CM | POA: Diagnosis not present

## 2024-08-09 DIAGNOSIS — Z951 Presence of aortocoronary bypass graft: Secondary | ICD-10-CM

## 2024-08-11 ENCOUNTER — Encounter (HOSPITAL_COMMUNITY)
Admission: RE | Admit: 2024-08-11 | Discharge: 2024-08-11 | Disposition: A | Discharge status: Home / Self Care | Source: Ambulatory Visit | Attending: Cardiology | Admitting: Cardiology

## 2024-08-11 DIAGNOSIS — I214 Non-ST elevation (NSTEMI) myocardial infarction: Secondary | ICD-10-CM

## 2024-08-11 DIAGNOSIS — Z48812 Encounter for surgical aftercare following surgery on the circulatory system: Secondary | ICD-10-CM | POA: Diagnosis not present

## 2024-08-11 DIAGNOSIS — Z951 Presence of aortocoronary bypass graft: Secondary | ICD-10-CM

## 2024-08-16 ENCOUNTER — Encounter (HOSPITAL_COMMUNITY)
Admission: RE | Admit: 2024-08-16 | Discharge: 2024-08-16 | Disposition: A | Source: Ambulatory Visit | Attending: Cardiology

## 2024-08-16 DIAGNOSIS — Z48812 Encounter for surgical aftercare following surgery on the circulatory system: Secondary | ICD-10-CM | POA: Diagnosis not present

## 2024-08-16 DIAGNOSIS — Z951 Presence of aortocoronary bypass graft: Secondary | ICD-10-CM

## 2024-08-16 DIAGNOSIS — I214 Non-ST elevation (NSTEMI) myocardial infarction: Secondary | ICD-10-CM

## 2024-08-18 ENCOUNTER — Other Ambulatory Visit (HOSPITAL_COMMUNITY): Payer: Self-pay

## 2024-08-18 ENCOUNTER — Ambulatory Visit: Attending: Cardiology | Admitting: Cardiology

## 2024-08-18 ENCOUNTER — Encounter: Payer: Self-pay | Admitting: Cardiology

## 2024-08-18 VITALS — BP 98/57 | HR 78 | Resp 16 | Ht 65.0 in | Wt 134.8 lb

## 2024-08-18 DIAGNOSIS — I2510 Atherosclerotic heart disease of native coronary artery without angina pectoris: Secondary | ICD-10-CM

## 2024-08-18 DIAGNOSIS — Z951 Presence of aortocoronary bypass graft: Secondary | ICD-10-CM | POA: Diagnosis not present

## 2024-08-18 DIAGNOSIS — E7800 Pure hypercholesterolemia, unspecified: Secondary | ICD-10-CM | POA: Diagnosis not present

## 2024-08-18 DIAGNOSIS — R42 Dizziness and giddiness: Secondary | ICD-10-CM

## 2024-08-18 MED ORDER — CLOPIDOGREL BISULFATE 75 MG PO TABS
75.0000 mg | ORAL_TABLET | Freq: Every day | ORAL | 3 refills | Status: AC
  Filled 2024-08-18: qty 90, 90d supply, fill #0

## 2024-08-18 NOTE — Progress Notes (Signed)
 Cardiology Office Note:  .   Date:  08/20/2024  ID:  Jesse [name], DOB Jun 17, 1941, MRN [id number] PCP: Ransom Other, MD   HeartCare Providers Cardiologist:  Gordy Bergamo, MD   History of Present Illness: .   Jesse [name] is a 83 y.o. male with a past medical history of hypertension, hyperlipidemia, depression, neuritis, polycythemia vera with phlebotomy presented with 04/06/2024 who was recently seen for NSTEMI, chronic catheterization revealing multivessel coronary disease and was referred for CABG.  Underwent CABG x 3 on 04/11/2024 with LIMA to LAD, SVG to OM, and SVG to PDA.  He has recuperated well and is participating in cardiac rehab.  He did not bring his medications and he is not aware of exactly what he is taking.  Hence medication reconciliation performed again when he came back with his medications in the afternoon.    Discussed the use of AI scribe software for clinical note transcription with the patient, who gave verbal consent to proceed.  History of Present Illness Jesse [name] is an 83 year old male with coronary artery disease and recent bypass surgery who presents for medication management.  He recently underwent bypass surgery after catheterization showed three significant blockages including a main artery. He was taken urgently to the catheterization lab, stabilized, and then had surgery. He is in cardiac rehabilitation and missed a session today due to this visit.  He has had no chest pain since surgery. He feels dopey at times, which he relates to his many medications. He is taking about eleven pills and is unsure of the complete list. He mentions simvastatin  for cholesterol but is unsure if he is on atorvastatin  or clopidogrel  and is confused about his regimen. He stopped gabapentin  because it made him dizzy.  He continues therapeutic phlebotomy for polycythemia and is followed by Dr. Timmy. He does not smoke.    Cardiac Studies relevent.    Cardiac  Catheterization 04/10/24:     ECHOCARDIOGRAM COMPLETE 04/10/2024  1. Left ventricular ejection fraction, by estimation, is 60 to 65%. The left ventricle has normal function. The left ventricle has no regional wall motion abnormalities. Left ventricular diastolic parameters are consistent with Grade I diastolic dysfunction (impaired relaxation). 2. Right ventricular systolic function is normal. The right ventricular size is normal.  Labs   Lab Results  Component Value Date   CHOL 129 04/10/2024   HDL 36 (L) 04/10/2024   LDLCALC 83 04/10/2024   TRIG 51 04/10/2024   CHOLHDL 3.6 04/10/2024   Lipoprotein (a)  Date/Time Value Ref Range Status  04/11/2024 04:28 AM <8.4 <75.0 nmol/L Final    Comment:    (NOTE) **Results verified by repeat testing** This test was developed and its performance characteristics determined by [facility]. It has not been cleared or approved by the Food and Drug Administration. Note:  Values greater than or equal to 75.0 nmol/L may       indicate an independent risk factor for CHD,       but must be evaluated with caution when applied       to non-Caucasian populations due to the       influence of genetic factors on Lp(a) across       ethnicities. Performed At: [facility] [address] [place], [place] [id number] Jennette Shorter MD Ey:[id number]     Recent Labs    04/20/24 0320 04/22/24 0930 07/07/24 1026  NA 128* 130* 141  K 3.7 3.4* 3.9  CL 100 101 105  CO2 20* 22 25  GLUCOSE 86 131* 109*  BUN 10 11 11   CREATININE 0.53* 0.71 0.66  CALCIUM  8.8* 9.1 10.2  GFRNONAA >60 >60 >60    Lab Results  Component Value Date   ALT 13 07/07/2024   AST 25 07/07/2024   ALKPHOS 80 07/07/2024   BILITOT 1.0 07/07/2024      Latest Ref Rng & Units 07/07/2024   10:26 AM 04/14/2024    3:08 AM 04/13/2024    4:06 AM  CBC  WBC 4.0 - 10.5 K/uL 6.5  8.3  11.3   Hemoglobin 13.0 - 17.0 g/dL 86.8  89.2  89.1   Hematocrit 39.0 - 52.0 % 41.7  31.4  31.3    Platelets 150 - 400 K/uL 319  170  148    Lab Results  Component Value Date   HGBA1C 4.9 04/10/2024    Lab Results  Component Value Date   TSH 1.335 04/10/2024     ROS  Review of Systems  Cardiovascular:  Negative for chest pain, dyspnea on exertion and leg swelling.   Physical Exam:   VS:  BP (!) 98/57 (BP Location: Left Arm, Patient Position: Sitting, Cuff Size: Normal)   Pulse 78   Resp 16   Ht 5' 5 (1.651 m)   Wt 134 lb 12.8 oz (61.1 kg)   SpO2 97%   BMI 22.43 kg/m    Wt Readings from Last 3 Encounters:  08/18/24 134 lb 12.8 oz (61.1 kg)  07/07/24 134 lb 0.6 oz (60.8 kg)  06/29/24 136 lb 0.4 oz (61.7 kg)    BP Readings from Last 3 Encounters:  08/18/24 (!) 98/57  07/07/24 (!) 109/59  06/29/24 114/72   Physical Exam Neck:     Vascular: No carotid bruit or JVD.  Cardiovascular:     Rate and Rhythm: Normal rate and regular rhythm.     Pulses: Intact distal pulses.     Heart sounds: Normal heart sounds. No murmur heard.    No gallop.  Pulmonary:     Effort: Pulmonary effort is normal.     Breath sounds: Normal breath sounds.  Abdominal:     General: Bowel sounds are normal.     Palpations: Abdomen is soft.  Musculoskeletal:     Right lower leg: No edema.     Left lower leg: No edema.    EKG:         ASSESSMENT AND PLAN: .      ICD-10-CM   1. Coronary artery disease involving native coronary artery of native heart without angina pectoris  I25.10 clopidogrel  (PLAVIX ) 75 MG tablet    Lipid Panel With LDL/HDL Ratio    2. CABG x 3 on 04/11/2024:  LIMA to LAD, SVG to OM,  SVG to PDA.  Z95.1     3. Hypercholesteremia  E78.00 Lipid Panel With LDL/HDL Ratio    4. Dizziness  R42 midodrine  (PROAMATINE ) 10 MG tablet     Assessment & Plan Atherosclerotic heart disease status post coronary artery bypass grafting (CABG) Status post CABG with no current chest pain. Medication list is inaccurate, necessitating reconciliation. Aspirin  is being discontinued and  replaced with clopidogrel . - Discontinued aspirin . - Initiated clopidogrel , one pill once daily. - Instructed to bring all medication bottles to the clinic for reconciliation.  Hypotension on midodrine  therapy Hypotension managed with midodrine . Blood pressure recorded at 97 mmHg. Medication list is inaccurate, necessitating reconciliation. - Continue midodrine  therapy as needed for dizziness, expect improvement in dizziness  and low BP after stopping Metoprolol .  Patient is presently not taking midodrine . - Continue low dose Linisopril HCT 20-12.5 mg in the morning - Instructed to bring all medication bottles to the clinic for reconciliation. - Discontinued Metoprolol  in view of low BP that is chronic. No suspicion for pericardial effusion. No JVD, no dyspnea, no leg edema or ascites.   Hypercholesterolemia Uncertainty regarding current cholesterol medication. Possible use of both atorvastatin  and simvastatin . Medication list is inaccurate, necessitating reconciliation. - Instructed to bring all medication bottles to the clinic for reconciliation. - Check lipid profile and goal LDL < 70. - Needs follow up with PCP for continued medication reconciliation and close monitoring.  - I called and left message with his sisted to let him know to get labs fasting  Time spent specifically medication reconciliation, counseling regarding medication use and coordination of care is 35 min.    Follow up: 3 months Signed,  Gordy Bergamo, MD, [facility] 08/20/2024, 10:24 AM [facility] [address] [place], [place] [address] Phone: [phone]. Fax:  [phone]

## 2024-08-18 NOTE — Patient Instructions (Signed)
 Medication Instructions:  Stop aspirin  Start Clopidogrel  75 mg by mouth daily  *If you need a refill on your cardiac medications before your next appointment, please call your pharmacy*  Lab Work: none If you have labs (blood work) drawn today and your tests are completely normal, you will receive your results only by: MyChart Message (if you have MyChart) OR A paper copy in the mail If you have any lab test that is abnormal or we need to change your treatment, we will call you to review the results.  Testing/Procedures: none  Follow-Up: At [facility], you and your health needs are our priority.  As part of our continuing mission to provide you with exceptional heart care, our providers are all part of one team.  This team includes your primary Cardiologist (physician) and Advanced Practice Providers or APPs (Physician Assistants and Nurse Practitioners) who all work together to provide you with the care you need, when you need it.  Your next appointment:   6 week(s)  With Dr Ladona or APP  Provider:    Dr Ganji or APPIf no MD populates, click here to update Cardiologist or EP   DO NOT delete brackets or number around this link :1}   We recommend signing up for the patient portal called MyChart.  Sign up information is provided on this After Visit Summary.  MyChart is used to connect with patients for Virtual Visits (Telemedicine).  Patients are able to view lab/test results, encounter notes, upcoming appointments, etc.  Non-urgent messages can be sent to your provider as well.   To learn more about what you can do with MyChart, go to [email or web address].   Other Instructions    Bring a list of medications you are currently taking to the office so we can update your medication list

## 2024-08-20 ENCOUNTER — Encounter: Payer: Self-pay | Admitting: Cardiology

## 2024-08-20 MED ORDER — MIDODRINE HCL 10 MG PO TABS: 10.0000 mg | ORAL_TABLET | Freq: Three times a day (TID) | ORAL | Status: DC | PRN

## 2024-08-23 ENCOUNTER — Encounter (HOSPITAL_COMMUNITY)

## 2024-08-24 ENCOUNTER — Other Ambulatory Visit: Payer: Self-pay | Admitting: Orthopedic Surgery

## 2024-08-24 NOTE — Progress Notes (Signed)
 Cardiac Individual Treatment Plan  Patient Details  Name: Jesse [name] MRN: [id number] Date of Birth: [birth year] Referring Provider:   Flowsheet Row INTENSIVE CARDIAC REHAB ORIENT from 06/29/2024 in [facility]  Referring Provider Ladona Heinz, MD    Initial Encounter Date:  Flowsheet Row INTENSIVE CARDIAC REHAB ORIENT from 06/29/2024 in [facility]  Date 06/29/24    Visit Diagnosis: 04/10/24 NSTEMI  04/11/24 S/P CABG x 3  Patient's Home Medications on Admission:  Current Outpatient Medications:    ALPRAZolam  (XANAX ) 1 MG tablet, Take 0.5 tablets (0.5 mg total) by mouth at bedtime., Disp: 30 tablet, Rfl: 0   Cholecalciferol  50 MCG (2000 UT) CAPS, Take 2,000 Units by mouth daily. (Patient not taking: Reported on 08/18/2024), Disp: , Rfl:    clopidogrel  (PLAVIX ) 75 MG tablet, Take 1 tablet (75 mg total) by mouth daily. (Patient not taking: Reported on 08/18/2024), Disp: 90 tablet, Rfl: 3   fish oil-omega-3 fatty acids 1000 MG capsule, Take 1 g by mouth every other day., Disp: , Rfl:    gabapentin  (NEURONTIN ) 100 MG capsule, TAKE 2 CAPSULES BY MOUTH 3 TIMES DAILY., Disp: 180 capsule, Rfl: 2   lisinopril -hydrochlorothiazide  (ZESTORETIC ) 20-12.5 MG tablet, Take 1 tablet by mouth daily., Disp: , Rfl:    midodrine  (PROAMATINE ) 10 MG tablet, Take 1 tablet (10 mg total) by mouth 3 (three) times daily as needed (For low BP and dizziness)., Disp: , Rfl:    sertraline (ZOLOFT) 25 MG tablet, 1 tablet Orally Once a day; Duration: 30 days, Disp: , Rfl:    simvastatin  (ZOCOR ) 20 MG tablet, Take 20 mg by mouth at bedtime., Disp: , Rfl:    tamsulosin  (FLOMAX ) 0.4 MG CAPS capsule, Take 0.4 mg by mouth daily., Disp: , Rfl:    vitamin B-12 (CYANOCOBALAMIN ) 100 MCG tablet, Take 100 mcg by mouth daily., Disp: , Rfl:   Past Medical History: Past Medical History:  Diagnosis Date   CABG x 3  on 04/11/2024:  LIMA to LAD, SVG to OM,  SVG to PDA. 04/11/2024   Coronary artery disease    Depression    Hypercholesteremia    Hypertension    HZ (herpes zoster) 09/02/2011   Neuritis    left foot   Polycythemia rubra vera (HCC) 09/02/2011    Tobacco Use: Social History   Tobacco Use  Smoking Status Never  Smokeless Tobacco Never  Tobacco Comments   never used tobacco    Labs: Review Flowsheet       Latest Ref Rng & Units 04/10/2024 04/11/2024  Labs for ITP Cardiac and Pulmonary Rehab  Cholestrol 0 - 200 mg/dL 870  -  LDL (calc) 0 - 99 mg/dL 83  -  HDL-C >59 mg/dL 36  -  Trlycerides <849 mg/dL 51  -  Hemoglobin J8r 4.8 - 5.6 % 4.9  -  PH, Arterial 7.35 - 7.45 - 7.374  7.387  7.365  7.387  7.362  7.343   PCO2 arterial 32 - 48 mmHg - 34.1  30.5  34.9  32.1  39.6  32.3   Bicarbonate 20.0 - 28.0 mmol/L - 19.9  18.3  20.3  19.3  22.5  17.6   TCO2 22 - 32 mmol/L - 21  19  21  22  20  22  21  24  24  19  20  19    Acid-base deficit 0.0 - 2.0 mmol/L -  5.0  6.0  5.0  5.0  3.0  7.0   O2 Saturation % - 98  99  97  100  100  100     Details       Multiple values from one day are sorted in reverse-chronological order          Exercise Target Goals: Exercise Program Goal: Individual exercise prescription set using results from initial 6 min walk test and THRR while considering  patient's activity barriers and safety.   Exercise Prescription Goal: Initial exercise prescription builds to 30-45 minutes a day of aerobic activity, 2-3 days per week.  Home exercise guidelines will be given to patient during program as part of exercise prescription that the participant will acknowledge.   Education: Aerobic Exercise: - Group verbal and visual presentation on the components of exercise prescription. Introduces F.I.T.T principle from ACSM for exercise prescriptions.  Reviews F.I.T.T. principles of aerobic exercise including progression. Written material provided at class  time.   Education: Resistance Exercise: - Group verbal and visual presentation on the components of exercise prescription. Introduces F.I.T.T principle from ACSM for exercise prescriptions  Reviews F.I.T.T. principles of resistance exercise including progression. Written material provided at class time.    Education: Exercise & Equipment Safety: - Individual verbal instruction and demonstration of equipment use and safety with use of the equipment.   Education: Exercise Physiology & General Exercise Guidelines: - Group verbal and written instruction with models to review the exercise physiology of the cardiovascular system and associated critical values. Provides general exercise guidelines with specific guidelines to those with heart or lung disease. Written material provided at class time.   Education: Flexibility, Balance, Mind/Body Relaxation: - Group verbal and visual presentation with interactive activity on the components of exercise prescription. Introduces F.I.T.T principle from ACSM for exercise prescriptions. Reviews F.I.T.T. principles of flexibility and balance exercise training including progression. Also discusses the mind body connection.  Reviews various relaxation techniques to help reduce and manage stress (i.e. Deep breathing, progressive muscle relaxation, and visualization). Balance handout provided to take home. Written material provided at class time.   Activity Barriers & Risk Stratification:  Activity Barriers & Cardiac Risk Stratification - 06/29/24 1155       Activity Barriers & Cardiac Risk Stratification   Activity Barriers History of Falls;Assistive Device;Back Problems;Balance Concerns;Other (comment)    Comments Neuropathy low back and down left leg with numbness.    Cardiac Risk Stratification High          6 Minute Walk:  6 Minute Walk     Row Name 06/29/24 1202         6 Minute Walk   Phase Initial     Distance 1108.8 feet     Walk Time 6  minutes     # of Rest Breaks 0     MPH 2.1     METS 2     RPE 11     Perceived Dyspnea  1     VO2 Peak 6.98     Symptoms Yes (comment)     Comments Mild shortness of breath.     Resting HR 70 bpm     Resting BP 114/72     Resting Oxygen Saturation  97 %     Exercise Oxygen Saturation  during 6 min walk 100 %     Max Ex. HR 79 bpm     Max Ex. BP 140/76     2 Minute Post BP 142/72  Oxygen Initial Assessment:   Oxygen Re-Evaluation:   Oxygen Discharge (Final Oxygen Re-Evaluation):   Initial Exercise Prescription:  Initial Exercise Prescription - 06/29/24 1400       Date of Initial Exercise RX and Referring Provider   Date 06/29/24    Referring Provider Ladona Heinz, MD    Expected Discharge Date 09/22/24      NuStep   Level 1    SPM 65    Minutes 25    METs 2      Prescription Details   Frequency (times per week) 3    Duration Progress to 30 minutes of continuous aerobic without signs/symptoms of physical distress      Intensity   THRR 40-80% of Max Heartrate 56-112    Ratings of Perceived Exertion 11-13    Perceived Dyspnea 0-4      Progression   Progression Continue to progress workloads to maintain intensity without signs/symptoms of physical distress.      Resistance Training   Training Prescription Yes    Weight 2 lbs    Reps 10-15          Perform Capillary Blood Glucose checks as needed.  Exercise Prescription Changes:   Exercise Prescription Changes     Row Name 07/05/24 1028 07/12/24 1031 07/26/24 1032 08/09/24 1024 08/16/24 1036     Response to Exercise   Blood Pressure (Admit) 122/82 120/70 130/72 94/60 97/66    Blood Pressure (Exercise) 110/60 110/58 144/78 112/60 --   Blood Pressure (Exit) 132/78 112/72 122/68 122/72 102/60   Heart Rate (Admit) 86 bpm 71 bpm 87 bpm 81 bpm 72 bpm   Heart Rate (Exercise) 93 bpm 89 bpm 85 bpm 95 bpm 97 bpm   Heart Rate (Exit) 81 bpm 80 bpm 70 bpm 81 bpm 69 bpm   Rating of Perceived Exertion  (Exercise) 11 11 11 11 11    Symptoms None None None None None   Comments Off to a fiar start with exercise. -- -- -- --   Duration Progress to 30 minutes of  aerobic without signs/symptoms of physical distress Progress to 30 minutes of  aerobic without signs/symptoms of physical distress Progress to 30 minutes of  aerobic without signs/symptoms of physical distress Progress to 30 minutes of  aerobic without signs/symptoms of physical distress Progress to 30 minutes of  aerobic without signs/symptoms of physical distress   Intensity -- THRR unchanged THRR unchanged THRR unchanged THRR unchanged     Progression   Progression Continue to progress workloads to maintain intensity without signs/symptoms of physical distress. Continue to progress workloads to maintain intensity without signs/symptoms of physical distress. Continue to progress workloads to maintain intensity without signs/symptoms of physical distress. Continue to progress workloads to maintain intensity without signs/symptoms of physical distress. Continue to progress workloads to maintain intensity without signs/symptoms of physical distress.   Average METs 1.9 1.7 1.9 1.7 1.6     Resistance Training   Training Prescription No Yes No No No   Weight Patient tired, no weights today. 2 lbs -- -- --   Reps -- 10-15 -- -- --   Time -- 5 Minutes -- -- --     Interval Training   Interval Training No No No No No     NuStep   Level 1 1 1 1 1    SPM 69 55 63 59 56   Minutes 25 25 25 25 25    METs 1.9 1.7 1.9 1.7 1.6      Exercise Comments:  Exercise Comments     Row Name 07/05/24 1117 08/04/24 1110         Exercise Comments Drakkar tolerated low intensity exercise fair, very deconditioned. Seated rest breaks taken during the warm-up stretches. He was fatigued after the aerobic exercise, no weights performed today. Reviewed goals with Christopher.         Exercise Goals and Review:   Exercise Goals     Row Name 06/29/24 1155              Exercise Goals   Increase Physical Activity Yes       Intervention Provide advice, education, support and counseling about physical activity/exercise needs.;Develop an individualized exercise prescription for aerobic and resistive training based on initial evaluation findings, risk stratification, comorbidities and participant's personal goals.       Expected Outcomes Short Term: Attend rehab on a regular basis to increase amount of physical activity.;Long Term: Exercising regularly at least 3-5 days a week.;Long Term: Add in home exercise to make exercise part of routine and to increase amount of physical activity.       Increase Strength and Stamina Yes       Intervention Provide advice, education, support and counseling about physical activity/exercise needs.;Develop an individualized exercise prescription for aerobic and resistive training based on initial evaluation findings, risk stratification, comorbidities and participant's personal goals.       Expected Outcomes Short Term: Increase workloads from initial exercise prescription for resistance, speed, and METs.;Short Term: Perform resistance training exercises routinely during rehab and add in resistance training at home;Long Term: Improve cardiorespiratory fitness, muscular endurance and strength as measured by increased METs and functional capacity ( )       Able to understand and use rate of perceived exertion (RPE) scale Yes       Intervention Provide education and explanation on how to use RPE scale       Expected Outcomes Short Term: Able to use RPE daily in rehab to express subjective intensity level;Long Term:  Able to use RPE to guide intensity level when exercising independently       Understanding of Exercise Prescription Yes       Intervention Provide education, explanation, and written materials on patient's individual exercise prescription       Expected Outcomes Short Term: Able to explain program exercise  prescription;Long Term: Able to explain home exercise prescription to exercise independently          Exercise Goals Re-Evaluation :  Exercise Goals Re-Evaluation     Row Name 07/05/24 1117 08/04/24 1110           Exercise Goal Re-Evaluation   Exercise Goals Review Increase Physical Activity;Increase Strength and Stamina;Able to understand and use rate of perceived exertion (RPE) scale Increase Physical Activity;Increase Strength and Stamina;Able to understand and use rate of perceived exertion (RPE) scale      Comments Koltyn was able to understand and use RPE scale appropriately. Cristal is walking 10 minutes at home most days of the week. Walking is limited due to neuropathy in his left leg.      Expected Outcomes Progress duration to 30 minutes and increase workloads as tolerated to help increase strength and stamina. Progress workloads and duration as able.         Discharge Exercise Prescription (Final Exercise Prescription Changes):  Exercise Prescription Changes - 08/16/24 1036       Response to Exercise   Blood Pressure (Admit) 97/66    Blood Pressure (Exit)  102/60    Heart Rate (Admit) 72 bpm    Heart Rate (Exercise) 97 bpm    Heart Rate (Exit) 69 bpm    Rating of Perceived Exertion (Exercise) 11    Symptoms None    Duration Progress to 30 minutes of  aerobic without signs/symptoms of physical distress    Intensity THRR unchanged      Progression   Progression Continue to progress workloads to maintain intensity without signs/symptoms of physical distress.    Average METs 1.6      Resistance Training   Training Prescription No      Interval Training   Interval Training No      NuStep   Level 1    SPM 56    Minutes 25    METs 1.6          Nutrition:  Target Goals: Understanding of nutrition guidelines, daily intake of sodium 1500mg , cholesterol 200mg , calories 30% from fat and 7% or less from saturated fats, daily to have 5 or more servings of fruits and  vegetables.  Education: Nutrition 1 -Group instruction provided by verbal, written material, interactive activities, discussions, models, and posters to present general guidelines for heart healthy nutrition including macronutrients, label reading, and promoting whole foods over processed counterparts. Education serves as pensions consultant of discussion of heart healthy eating for all. Written material provided at class time.    Education: Nutrition 2 -Group instruction provided by verbal, written material, interactive activities, discussions, models, and posters to present general guidelines for heart healthy nutrition including sodium, cholesterol, and saturated fat. Providing guidance of habit forming to improve blood pressure, cholesterol, and body weight. Written material provided at class time.     Biometrics:  Pre Biometrics - 06/29/24 1037       Pre Biometrics   Waist Circumference 36.5 inches    Hip Circumference 37.25 inches    Waist to Hip Ratio 0.98 %    Triceps Skinfold 9 mm    % Body Fat 23 %    Grip Strength 10 kg    Flexibility --   Not performed due to back issues.   Single Leg Stand 4.52 seconds           Nutrition Therapy Plan and Nutrition Goals:   Nutrition Assessments:  MEDIFICTS Score Key: >=70 Need to make dietary changes  40-70 Heart Healthy Diet <= 40 Therapeutic Level Cholesterol Diet   Picture Your Plate Scores: <59 Unhealthy dietary pattern with much room for improvement. 41-50 Dietary pattern unlikely to meet recommendations for good health and room for improvement. 51-60 More healthful dietary pattern, with some room for improvement.  >60 Healthy dietary pattern, although there may be some specific behaviors that could be improved.    Nutrition Goals Re-Evaluation:   Nutrition Goals Discharge (Final Nutrition Goals Re-Evaluation):   Psychosocial: Target Goals: Acknowledge presence or absence of significant depression and/or stress,  maximize coping skills, provide positive support system. Participant is able to verbalize types and ability to use techniques and skills needed for reducing stress and depression.   Education: Stress, Anxiety, and Depression - Group verbal and visual presentation to define topics covered.  Reviews how body is impacted by stress, anxiety, and depression.  Also discusses healthy ways to reduce stress and to treat/manage anxiety and depression. Written material provided at class time.   Education: Sleep Hygiene -Provides group verbal and written instruction about how sleep can affect your health.  Define sleep hygiene, discuss sleep cycles and impact  of sleep habits. Review good sleep hygiene tips.   Initial Review & Psychosocial Screening:  Initial Psych Review & Screening - 06/29/24 1305       Initial Review   Current issues with Current Depression;History of Depression;Current Anxiety/Panic;Current Stress Concerns;Current Psychotropic Meds    Source of Stress Concerns Family    Comments [place] lives with his sister who is 63 years old. Philopateer's sister has schizophrenia. Cora's sister has a care giver who comes in and looks after her.      Family Dynamics   Good Support System? No   Caidin says that he is alone. Able says he has a second sister who has parkinsons. Keanthony did say he has a niece and a nephew he can rely on upon questioning.   Strains Illness and family care strain    Comments Ingram is taking an antidepressant and antianxiety medication. Jhonathan says he is not interested in receving counseling at this time.      Barriers   Psychosocial barriers to participate in program The patient should benefit from training in stress management and relaxation.      Screening Interventions   Interventions To provide support and resources with identified psychosocial needs;Encouraged to exercise;Provide feedback about the scores to participant    Expected Outcomes Long Term Goal: Stressors or  current issues are controlled or eliminated.;Long Term goal: The participant improves quality of Life and PHQ9 Scores as seen by post scores and/or verbalization of changes          Quality of Life Scores:   Quality of Life - 06/29/24 1440       Quality of Life   Select Quality of Life      Quality of Life Scores   Health/Function Pre 20.38 %    Socioeconomic Pre 21.42 %    Psych/Spiritual Pre 20.21 %    Family Pre 17 %    GLOBAL Pre 20.31 %         Scores of 19 and below usually indicate a poorer quality of life in these areas.  A difference of  2-3 points is a clinically meaningful difference.  A difference of 2-3 points in the total score of the Quality of Life Index has been associated with significant improvement in overall quality of life, self-image, physical symptoms, and general health in studies assessing change in quality of life.  PHQ-9: Review Flowsheet  More data exists      07/07/2024 06/29/2024 04/06/2024 01/21/2023 12/12/2022  Depression screen PHQ 2/9  Decreased Interest 0 2 0 0 2  Down, Depressed, Hopeless 0 0 0 0 1  PHQ - 2 Score 0 2 0 0 3  Altered sleeping 0 0 - - 1  Tired, decreased energy 1 2 - - 2  Change in appetite 1 2 - - 0  Feeling bad or failure about yourself  0 0 - - 0  Trouble concentrating 0 1 - - 0  Moving slowly or fidgety/restless 0 0 - - 0  Suicidal thoughts 0 0 - - 0  PHQ-9 Score 2  7  - - 6   Difficult doing work/chores - Somewhat difficult - - -    Details       Data saved with a previous flowsheet row definition        Interpretation of Total Score  Total Score Depression Severity:  1-4 = Minimal depression, 5-9 = Mild depression, 10-14 = Moderate depression, 15-19 = Moderately severe depression, 20-27 = Severe depression  Psychosocial Evaluation and Intervention:   Psychosocial Re-Evaluation:  Psychosocial Re-Evaluation     Row Name 07/05/24 1658 07/23/24 1643 08/24/24 1450         Psychosocial Re-Evaluation    Current issues with Current Psychotropic Meds;Current Anxiety/Panic;Current Stress Concerns;Current Depression;History of Depression Current Psychotropic Meds;Current Anxiety/Panic;Current Stress Concerns;Current Depression;History of Depression Current Psychotropic Meds;Current Anxiety/Panic;Current Stress Concerns;Current Depression;History of Depression     Comments Skanda started cardiac rehab on 07/05/24. Shaine did not voice any increased concerns on his first day of exercise. Pt lives with his sister who has a care giver during the day. Erian says he eats out has low energy and a poor appetite. Jashua is not interested in attending any education classes at this time. Derek has not voiced any increased concerns or stressors during exercise at cardiac rehab Jaryan continues not voiced any increased concerns or stressors during exercise at cardiac rehab     Expected Outcomes Dain will have controlled of decreased depression upon completion of cardiac rehab. Jeanmarc will have controlled of decreased depression upon completion of cardiac rehab. Shem will have controlled of decreased depression upon completion of cardiac rehab.     Interventions Stress management education;Relaxation education;Encouraged to attend Cardiac Rehabilitation for the exercise Stress management education;Relaxation education;Encouraged to attend Cardiac Rehabilitation for the exercise Stress management education;Relaxation education;Encouraged to attend Cardiac Rehabilitation for the exercise     Continue Psychosocial Services  No Follow up required No Follow up required No Follow up required       Initial Review   Source of Stress Concerns Chronic Illness;Family Chronic Illness;Family Chronic Illness;Family     Comments Will continue to monitor and offer support as needed, Will continue to monitor and offer support as needed, Will continue to monitor and offer support as needed,        Psychosocial Discharge (Final Psychosocial  Re-Evaluation):  Psychosocial Re-Evaluation - 08/24/24 1450       Psychosocial Re-Evaluation   Current issues with Current Psychotropic Meds;Current Anxiety/Panic;Current Stress Concerns;Current Depression;History of Depression    Comments Taveon continues not voiced any increased concerns or stressors during exercise at cardiac rehab    Expected Outcomes Khalin will have controlled of decreased depression upon completion of cardiac rehab.    Interventions Stress management education;Relaxation education;Encouraged to attend Cardiac Rehabilitation for the exercise    Continue Psychosocial Services  No Follow up required      Initial Review   Source of Stress Concerns Chronic Illness;Family    Comments Will continue to monitor and offer support as needed,          Vocational Rehabilitation: Provide vocational rehab assistance to qualifying candidates.   Vocational Rehab Evaluation & Intervention:  Vocational Rehab - 06/29/24 1347       Initial Vocational Rehab Evaluation & Intervention   Assessment shows need for Vocational Rehabilitation No   Polk is retired and does not need vocational rehab at this time.         Education: Education Goals: Education classes will be provided on a variety of topics geared toward better understanding of heart health and risk factor modification. Participant will state understanding/return demonstration of topics presented as noted by education test scores.  Learning Barriers/Preferences:  Learning Barriers/Preferences - 06/29/24 1347       Learning Barriers/Preferences   Learning Barriers Exercise Concerns;Sight   Alexius has neuropathy and has had intermitent dizziness at times. Ralph wears reading glasses   Learning Preferences Pictoral;Skilled Demonstration  General Cardiac Education Topics:  AED/CPR: - Group verbal and written instruction with the use of models to demonstrate the basic use of the AED with the basic ABC's of  resuscitation.   Test and Procedures: - Group verbal and visual presentation and models provide information about basic cardiac anatomy and function. Reviews the testing methods done to diagnose heart disease and the outcomes of the test results. Describes the treatment choices: Medical Management, Angioplasty, or Coronary Bypass Surgery for treating various heart conditions including Myocardial Infarction, Angina, Valve Disease, and Cardiac Arrhythmias. Written material provided at class time.   Medication Safety: - Group verbal and visual instruction to review commonly prescribed medications for heart and lung disease. Reviews the medication, class of the drug, and side effects. Includes the steps to properly store meds and maintain the prescription regimen. Written material provided at class time.   Intimacy: - Group verbal instruction through game format to discuss how heart and lung disease can affect sexual intimacy. Written material provided at class time.   Know Your Numbers and Heart Failure: - Group verbal and visual instruction to discuss disease risk factors for cardiac and pulmonary disease and treatment options.  Reviews associated critical values for Overweight/Obesity, Hypertension, Cholesterol, and Diabetes.  Discusses basics of heart failure: signs/symptoms and treatments.  Introduces Heart Failure Zone chart for action plan for heart failure. Written material provided at class time.   Infection Prevention: - Provides verbal and written material to individual with discussion of infection control including proper hand washing and proper equipment cleaning during exercise session.   Falls Prevention: - Provides verbal and written material to individual with discussion of falls prevention and safety.   Other: -Provides group and verbal instruction on various topics (see comments)   Knowledge Questionnaire Score:  Knowledge Questionnaire Score - 06/29/24 1440        Knowledge Questionnaire Score   Pre Score 17/24          Core Components/Risk Factors/Patient Goals at Admission:  Personal Goals and Risk Factors at Admission - 06/29/24 1155       Core Components/Risk Factors/Patient Goals on Admission   Hypertension Yes    Intervention Provide education on lifestyle modifcations including regular physical activity/exercise, weight management, moderate sodium restriction and increased consumption of fresh fruit, vegetables, and low fat dairy, alcohol moderation, and smoking cessation.;Monitor prescription use compliance.    Expected Outcomes Short Term: Continued assessment and intervention until BP is < 140/43mm HG in hypertensive participants. < 130/23mm HG in hypertensive participants with diabetes, heart failure or chronic kidney disease.;Long Term: Maintenance of blood pressure at goal levels.    Lipids Yes    Intervention Provide education and support for participant on nutrition & aerobic/resistive exercise along with prescribed medications to achieve LDL 70mg , HDL >40mg .    Expected Outcomes Short Term: Participant states understanding of desired cholesterol values and is compliant with medications prescribed. Participant is following exercise prescription and nutrition guidelines.;Long Term: Cholesterol controlled with medications as prescribed, with individualized exercise RX and with personalized nutrition plan. Value goals: LDL < 70mg , HDL > 40 mg.          Education:Diabetes - Individual verbal and written instruction to review signs/symptoms of diabetes, desired ranges of glucose level fasting, after meals and with exercise. Acknowledge that pre and post exercise glucose checks will be done for 3 sessions at entry of program.   Core Components/Risk Factors/Patient Goals Review:   Goals and Risk Factor Review     Row  Name 07/08/24 9147 07/23/24 1644 08/24/24 1452         Core Components/Risk Factors/Patient Goals Review   Personal  Goals Review Weight Management/Obesity;Hypertension;Lipids Weight Management/Obesity;Hypertension;Lipids Weight Management/Obesity;Hypertension;Lipids     Review Giovani started cardiac rehab on 07/05/24. Lamond did fair with exercise for his fitness level. Artavious is only attending exercise classes. Robertson is not interested in attending any education classes. Olly started cardiac rehab on 07/05/24. Neithan is off to a fair start  with exercise for his fitness level. Thao is doing fair with exercise at cardiac rehab for his fitness level.  Rsting Vital signs haved been in low 100's to upper 90's. Asymptomatic.     Expected Outcomes Anthony will continue to participate in cardiac rehab for exercise, nutrition and lifestyle modifications Tannon will continue to participate in cardiac rehab for exercise, nutrition and lifestyle modifications Strother will continue to participate in cardiac rehab for exercise, nutrition and lifestyle modifications        Core Components/Risk Factors/Patient Goals at Discharge (Final Review):   Goals and Risk Factor Review - 08/24/24 1452       Core Components/Risk Factors/Patient Goals Review   Personal Goals Review Weight Management/Obesity;Hypertension;Lipids    Review Dory is doing fair with exercise at cardiac rehab for his fitness level.  Rsting Vital signs haved been in low 100's to upper 90's. Asymptomatic.    Expected Outcomes Delrick will continue to participate in cardiac rehab for exercise, nutrition and lifestyle modifications          ITP Comments:  ITP Comments     Row Name 06/29/24 1040 07/05/24 1657 07/23/24 1642 08/24/24 1447     ITP Comments Dr Wilbert Bihari Medical Director. [facility] Education Program/ Intensive cardiac rehab. Initial Orientation packet reviewed with the patient. 30 Day ITP comments. Cola started cardiac rehab on 07/05/24. Reiss did fair with exercise for his fitness level. 30 Day ITP comments. Demarie started cardiac rehab on 07/05/24. Tommy  is off to a good start to exercise for his fitness level. 30 Day ITP comments. Mohammad has fair participation with exercise at cardiac rehab. Avin warms up with the group and completes his indivividual exercises. Tirso does not stay for post exercise coold down.       Comments: See ITP Comments

## 2024-08-25 ENCOUNTER — Encounter (HOSPITAL_COMMUNITY)

## 2024-08-30 ENCOUNTER — Encounter (HOSPITAL_COMMUNITY)

## 2024-09-01 ENCOUNTER — Encounter (HOSPITAL_COMMUNITY): Admission: RE | Admit: 2024-09-01 | Discharge: 2024-09-01 | Attending: Cardiology

## 2024-09-01 DIAGNOSIS — Z951 Presence of aortocoronary bypass graft: Secondary | ICD-10-CM

## 2024-09-01 DIAGNOSIS — I214 Non-ST elevation (NSTEMI) myocardial infarction: Secondary | ICD-10-CM

## 2024-09-06 ENCOUNTER — Encounter (HOSPITAL_COMMUNITY)
Admission: RE | Admit: 2024-09-06 | Discharge: 2024-09-06 | Disposition: A | Discharge status: Home / Self Care | Source: Ambulatory Visit | Attending: Cardiology | Admitting: Cardiology

## 2024-09-06 DIAGNOSIS — I214 Non-ST elevation (NSTEMI) myocardial infarction: Secondary | ICD-10-CM | POA: Diagnosis not present

## 2024-09-06 DIAGNOSIS — Z951 Presence of aortocoronary bypass graft: Secondary | ICD-10-CM

## 2024-09-06 NOTE — Progress Notes (Signed)
 Pt brought in his medication in for cardiac rehab for reconciliation.  Reviewed medications.  Missing from bag - Vitamin d - pt states he has it but doesn't take it daily. Gabapentin  - pt states he quit taking it didn't help him, Midodrine  states was advised to stop taking and Flomax  - states he is out of this medication.  Called [facility] on [address] to determine if he has a current prescription.  Advised by [facility] staff that he is out of refills and a notice was sent to his PCP - Dr Ransom.  Will resend another request.  Verified that Dr Husain was the prescribing provider.  Olyn had indicated it was Dr Carolee and [facility] who prescribed the medication.  Called Dr Ransom office and spoke with his CMA.  Refill will be sent to [facility] for 90 day supply and 1 refill.  Pt has upcoming appt with Dr Husain in January.  Saturnino Bernett PEAK, BSN Cardiac and Emergency Planning/management Officer

## 2024-09-08 ENCOUNTER — Encounter (HOSPITAL_COMMUNITY): Admission: RE | Admit: 2024-09-08 | Discharge: 2024-09-08 | Attending: Cardiology

## 2024-09-08 DIAGNOSIS — Z951 Presence of aortocoronary bypass graft: Secondary | ICD-10-CM

## 2024-09-08 DIAGNOSIS — I214 Non-ST elevation (NSTEMI) myocardial infarction: Secondary | ICD-10-CM | POA: Diagnosis not present

## 2024-09-13 ENCOUNTER — Encounter (HOSPITAL_COMMUNITY): Admission: RE | Admit: 2024-09-13 | Discharge: 2024-09-13 | Attending: Cardiology

## 2024-09-13 DIAGNOSIS — I214 Non-ST elevation (NSTEMI) myocardial infarction: Secondary | ICD-10-CM | POA: Diagnosis not present

## 2024-09-13 DIAGNOSIS — Z951 Presence of aortocoronary bypass graft: Secondary | ICD-10-CM

## 2024-09-15 ENCOUNTER — Encounter (HOSPITAL_COMMUNITY)
Admission: RE | Admit: 2024-09-15 | Discharge: 2024-09-15 | Disposition: A | Source: Ambulatory Visit | Attending: Cardiology

## 2024-09-15 DIAGNOSIS — I214 Non-ST elevation (NSTEMI) myocardial infarction: Secondary | ICD-10-CM | POA: Diagnosis not present

## 2024-09-15 DIAGNOSIS — Z951 Presence of aortocoronary bypass graft: Secondary | ICD-10-CM

## 2024-09-15 NOTE — Progress Notes (Signed)
 Reviewed home exercise guidelines with Kolbi including endpoints, temperature precautions, target heart rate and rate of perceived exertion. He is currently walking 10 minutes daily as his mode of home exercise. 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 Discussed increasing walking at tolerated to 10 minutes 3 times/day. He will also do the stretching exercises from cardiac rehab. Miran voices understanding of instructions given.  Arnoldo CHRISTELLA Gal, MS, ACSM CEP

## 2024-09-20 ENCOUNTER — Encounter (HOSPITAL_COMMUNITY): Admission: RE | Admit: 2024-09-20 | Source: Ambulatory Visit

## 2024-09-22 ENCOUNTER — Encounter (HOSPITAL_COMMUNITY)
Admission: RE | Admit: 2024-09-22 | Discharge: 2024-09-22 | Disposition: A | Discharge status: Home / Self Care | Source: Ambulatory Visit | Attending: Cardiology | Admitting: Cardiology

## 2024-09-22 VITALS — BP 110/70 | HR 106 | Ht 64.25 in | Wt 133.6 lb

## 2024-09-22 DIAGNOSIS — I214 Non-ST elevation (NSTEMI) myocardial infarction: Secondary | ICD-10-CM | POA: Diagnosis not present

## 2024-09-22 DIAGNOSIS — Z951 Presence of aortocoronary bypass graft: Secondary | ICD-10-CM

## 2024-09-22 NOTE — Progress Notes (Signed)
 Discharge Progress Report  Patient Details  Name: Jesse [name] MRN: [id number] Date of Birth: 1941/07/28 Referring Provider:   Flowsheet Row INTENSIVE CARDIAC REHAB ORIENT from 06/29/2024 in [facility]  Referring Provider Ladona Heinz, MD     Number of Visits: 21  Reason for Discharge:  Patient reached a stable level of exercise.  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  ADL [facility]:   Initial Exercise Prescription:  Initial Exercise Prescription - 06/29/24 1400       Date of Initial Exercise RX and Referring Provider   Date 06/29/24    Referring Provider Ladona Heinz, MD    Expected Discharge Date 09/22/24      NuStep   Level 1    SPM 65    Minutes 25    METs 2      Prescription Details   Frequency (times per week) 3    Duration Progress to 30 minutes of continuous aerobic without signs/symptoms of physical distress      Intensity   THRR 40-80% of Max Heartrate 56-112    Ratings of Perceived Exertion 11-13    Perceived Dyspnea 0-4      Progression   Progression Continue to progress workloads to maintain intensity without signs/symptoms of physical distress.      Resistance Training   Training Prescription Yes    Weight 2 lbs    Reps 10-15          Discharge Exercise Prescription (Final Exercise Prescription Changes):  Exercise Prescription Changes - 09/22/24 1024       Response to Exercise   Blood Pressure (Admit) 110/70    Blood Pressure (Exercise) 148/82    Blood Pressure (Exit) 104/70    Heart Rate (Admit) 106 bpm    Heart Rate (Exercise) 115 bpm    Heart Rate (Exit) 113 bpm    Rating of Perceived Exertion (Exercise) 13    Symptoms None    Comments Wendell completed the cardiac rehab program today.    Duration Progress to 30 minutes of  aerobic without signs/symptoms of physical distress    Intensity THRR unchanged      Progression    Progression Continue to progress workloads to maintain intensity without signs/symptoms of physical distress.    Average METs 2.3      Resistance Training   Training Prescription No      Interval Training   Interval Training No      NuStep   Level 1    SPM 77    Minutes 25    METs 2.3      Home Exercise Plan   Plans to continue exercise at Home (comment)   walking   Frequency Add 4 additional days to program exercise sessions.    Initial Home Exercises Provided 09/15/24          Functional Capacity:  6 Minute Walk     Row Name 06/29/24 1202 09/22/24 1046       6 Minute Walk   Phase Initial Discharge    Distance 1108.8 feet 1320 feet    Distance % Change -- 19.05 %    Distance Feet Change -- 211.2 ft    Walk Time 6 minutes 6 minutes    # of Rest Breaks 0 0    MPH 2.1 2.5    METS 2 2.4    RPE 11 13  Perceived Dyspnea  1 0    VO2 Peak 6.98 9.7    Symptoms Yes (comment) Yes (comment)    Comments Mild shortness of breath. Neuropathy pain, left calf 2-3/10.    Resting HR 70 bpm 106 bpm    Resting BP 114/72 110/70    Resting Oxygen Saturation  97 % --    Exercise Oxygen Saturation  during 6 min walk 100 % --    Max Ex. HR 79 bpm 115 bpm    Max Ex. BP 140/76 148/82    2 Minute Post BP 142/72 --       Psychological, QOL, Others - Outcomes: PHQ 2/9:    07/07/2024   10:48 AM 06/29/2024    2:33 PM 04/06/2024   11:45 AM 01/21/2023   10:16 AM 12/12/2022   10:24 AM  Depression screen PHQ 2/9  Decreased Interest 0 2 0 0 2  Down, Depressed, Hopeless 0 0 0 0 1  PHQ - 2 Score 0 2 0 0 3  Altered sleeping 0 0   1  Tired, decreased energy 1 2   2   Change in appetite 1 2   0  Feeling bad or failure about yourself  0 0   0  Trouble concentrating 0 1   0  Moving slowly or fidgety/restless 0 0   0  Suicidal thoughts 0 0   0  PHQ-9 Score 2  7    6    Difficult doing work/chores  Somewhat difficult        Data saved with a previous flowsheet row definition     Quality of Life:  Quality of Life - 06/29/24 1440       Quality of Life   Select Quality of Life      Quality of Life Scores   Health/Function Pre 20.38 %    Socioeconomic Pre 21.42 %    Psych/Spiritual Pre 20.21 %    Family Pre 17 %    GLOBAL Pre 20.31 %          Personal Goals: Goals established at orientation with interventions provided to work toward goal.  Personal Goals and Risk Factors at Admission - 06/29/24 1155       Core Components/Risk Factors/Patient Goals on Admission   Hypertension Yes    Intervention Provide education on lifestyle modifcations including regular physical activity/exercise, weight management, moderate sodium restriction and increased consumption of fresh fruit, vegetables, and low fat dairy, alcohol moderation, and smoking cessation.;Monitor prescription use compliance.    Expected Outcomes Short Term: Continued assessment and intervention until BP is < 140/81mm HG in hypertensive participants. < 130/41mm HG in hypertensive participants with diabetes, heart failure or chronic kidney disease.;Long Term: Maintenance of blood pressure at goal levels.    Lipids Yes    Intervention Provide education and support for participant on nutrition & aerobic/resistive exercise along with prescribed medications to achieve LDL 70mg , HDL >40mg .    Expected Outcomes Short Term: Participant states understanding of desired cholesterol values and is compliant with medications prescribed. Participant is following exercise prescription and nutrition guidelines.;Long Term: Cholesterol controlled with medications as prescribed, with individualized exercise RX and with personalized nutrition plan. Value goals: LDL < 70mg , HDL > 40 mg.           Personal Goals Discharge:  Goals and Risk Factor Review     Row Name 07/08/24 0852 07/23/24 1644 08/24/24 1452 09/28/24 1453       Core Components/Risk Factors/Patient Goals Review  Personal Goals Review Weight  Management/Obesity;Hypertension;Lipids Weight Management/Obesity;Hypertension;Lipids Weight Management/Obesity;Hypertension;Lipids Weight Management/Obesity;Hypertension;Lipids    Review Holger started cardiac rehab on 07/05/24. Dhani did fair with exercise for his fitness level. Traquan is only attending exercise classes. Sylvia is not interested in attending any education classes. Mosiah started cardiac rehab on 07/05/24. Nemiah is off to a fair start  with exercise for his fitness level. Torie is doing fair with exercise at cardiac rehab for his fitness level.  Rsting Vital signs haved been in low 100's to upper 90's. Asymptomatic. Dyshawn is did fair with exercise at cardiac rehab. Balthazar completed cardiac rehab on 09/22/24.    Expected Outcomes Wilbur will continue to participate in cardiac rehab for exercise, nutrition and lifestyle modifications Thane will continue to participate in cardiac rehab for exercise, nutrition and lifestyle modifications Dekota will continue to participate in cardiac rehab for exercise, nutrition and lifestyle modifications Bertha will continue to participate in cardiac rehab for exercise, nutrition and lifestyle modifications       Exercise Goals and Review:  Exercise Goals     Row Name 06/29/24 1155             Exercise Goals   Increase Physical Activity Yes       Intervention Provide advice, education, support and counseling about physical activity/exercise needs.;Develop an individualized exercise prescription for aerobic and resistive training based on initial evaluation findings, risk stratification, comorbidities and participant's personal goals.       Expected Outcomes Short Term: Attend rehab on a regular basis to increase amount of physical activity.;Long Term: Exercising regularly at least 3-5 days a week.;Long Term: Add in home exercise to make exercise part of routine and to increase amount of physical activity.       Increase Strength and Stamina Yes        Intervention Provide advice, education, support and counseling about physical activity/exercise needs.;Develop an individualized exercise prescription for aerobic and resistive training based on initial evaluation findings, risk stratification, comorbidities and participant's personal goals.       Expected Outcomes Short Term: Increase workloads from initial exercise prescription for resistance, speed, and METs.;Short Term: Perform resistance training exercises routinely during rehab and add in resistance training at home;Long Term: Improve cardiorespiratory fitness, muscular endurance and strength as measured by increased METs and functional capacity ( )       Able to understand and use rate of perceived exertion (RPE) scale Yes       Intervention Provide education and explanation on how to use RPE scale       Expected Outcomes Short Term: Able to use RPE daily in rehab to express subjective intensity level;Long Term:  Able to use RPE to guide intensity level when exercising independently       Understanding of Exercise Prescription Yes       Intervention Provide education, explanation, and written materials on patient's individual exercise prescription       Expected Outcomes Short Term: Able to explain program exercise prescription;Long Term: Able to explain home exercise prescription to exercise independently          Exercise Goals Re-Evaluation:  Exercise Goals Re-Evaluation     Row Name 07/05/24 1117 08/04/24 1110 09/01/24 1102 09/15/24 1104 09/22/24 1040     Exercise Goal Re-Evaluation   Exercise Goals Review Increase Physical Activity;Increase Strength and Stamina;Able to understand and use rate of perceived exertion (RPE) scale Increase Physical Activity;Increase Strength and Stamina;Able to understand and use rate of perceived exertion (RPE) scale  Increase Physical Activity;Increase Strength and Stamina;Able to understand and use rate of perceived exertion (RPE) scale Increase Physical  Activity;Increase Strength and Stamina;Able to understand and use rate of perceived exertion (RPE) scale;Understanding of Exercise Prescription Increase Physical Activity;Increase Strength and Stamina;Able to understand and use rate of perceived exertion (RPE) scale;Understanding of Exercise Prescription   Comments Xadrian was able to understand and use RPE scale appropriately. Jenna is walking 10 minutes at home most days of the week. Walking is limited due to neuropathy in his left leg. Discussed increasing duration from 25 minutes on the NuStep to 30 minutes. He was agreeable to increase to 28 minutes today but then felt he would be too tired. Discussed increasing duration from 25 minutes on the NuStep to 30 minutes. He was agreeable to increase to 28 minutes today but then felt he would be too tired. Averi completed the cardiac rehab program and will continue walking independently at this time.   Expected Outcomes Progress duration to 30 minutes and increase workloads as tolerated to help increase strength and stamina. Progress workloads and duration as able. Increase duration as tolerated. Kenya will increase walking from 10 minutes daily to 10 minutes 3 times/ day as tolerated. Aniketh will increase walking from 10 minutes daily to 10 minutes 3 times/ day as tolerated.      Nutrition & Weight - Outcomes:  Pre Biometrics - 06/29/24 1037       Pre Biometrics   Waist Circumference 36.5 inches    Hip Circumference 37.25 inches    Waist to Hip Ratio 0.98 %    Triceps Skinfold 9 mm    % Body Fat 23 %    Grip Strength 10 kg    Flexibility --   Not performed due to back issues.   Single Leg Stand 4.52 seconds          Post Biometrics - 09/22/24 1045        Post  Biometrics   Height 5' 4.25 (1.632 m)    Waist Circumference 34.75 inches    Hip Circumference 36 inches    Waist to Hip Ratio 0.97 %    BMI (Calculated) 22.75    Triceps Skinfold 7 mm    % Body Fat 21.1 %    Grip Strength 15 kg     Flexibility --   Not performed due to back issues.   Single Leg Stand 1.68 seconds          Nutrition:   Nutrition Discharge:   Education Questionnaire Score:  Knowledge Questionnaire Score - 06/29/24 1440       Knowledge Questionnaire Score   Pre Score 17/24          Goals reviewed with patient; copy given to patient.Pt graduates from  Intensive/Traditional cardiac rehab program on 09/22/24 with completion of 19  exercise and  2 education sessions. Pt maintained good attendance and progressed nicely during their participation in rehab as evidenced by increased MET level. Samvel increased his distance on his post exercise walk test by 211 feet.  Medication list reconciled.  Pt has made significant lifestyle changes and should be commended for his success.  Pt plans to continue exercise at home as tolerated. Hadassah Elpidio Quan RN BSN      [1]  Social History Tobacco Use  Smoking Status Never  Smokeless Tobacco Never  Tobacco Comments   never used tobacco

## 2024-09-27 ENCOUNTER — Encounter (HOSPITAL_COMMUNITY)

## 2024-10-03 NOTE — Progress Notes (Unsigned)
 " Cardiology Office Note:    Date:  10/06/2024   ID:  ALEKSI [name], DOB [birth year], MRN [id number]  PCP:  Ransom Other, MD   [facility] Providers Cardiologist:  Gordy Bergamo, MD     Referring MD: Ransom Other, MD   Chief complaint: Follow-up CAD     History of Present Illness:   Jesse [name] is a 84 y.o. male with a hx of NSTEMI ? CABG X3, HTN, HLD, polycythemia vera with phlebotomy, presenting today for 36-month follow-up.  [facility] July 2025 complaining of 9/10 chest pain, emergently taken to the the cardiac Cath Lab following a significantly abnormal EKG, chest pain, diaphoresis, SOB, nausea.  LHC 04/10/2024: Distal LM with 99% calcific stenosis, flush occlusion of the LAD reconstituted at the mid-distal segment via collaterals from LCx.  LCx ostial calcification of 40% with stenosis of the OM 3.  RCA diffusely diseased and heavily calcified.  Patient was referred to CABG.  Pre-CABG echo: LVEF 60-65%, no RWMA, G1 DD, normal RV, normal MV, normal AV, RA pressure 3 mmHg.  Patient received a CABG on 04/11/2024, LIMA-LAD, SVG-OM, SVG-PDA.  Postoperatively the patient developed tachycardia with PACs, vitals improved over time.  Patient refused SNF placement, requested home health PT/OT.  BP remained orthostatic, midodrine  started.  Following start of PT and OT patient changed his mind and was agreeable to SNF placement.  Discharged on aspirin , atorvastatin , midodrine , tamsulosin , lisinopril  HCTZ.  Postop follow-up on 05/03/2024 with Orren Fabry, patient was doing well.  Lisinopril  HCTZ stopped, metoprolol  started.  Patient did present to the ED 05/22/2024 following a mechanical fall after tripping over his socks resulting in a laceration to his nose.  CT head neck and face all negative for acute findings.  Most recent outpatient follow-up in November 2025 with Dr. Bergamo patient was doing well.  He had stopped his gabapentin  following complaints of dizziness and had  some confusion regarding which meds he needed to be taking.  Aspirin  was discontinued and clopidogrel  started.  He was instructed to bring all medication bottles to the clinic for med reconciliation.  Metoprolol  was stopped, lisinopril -HCTZ was restarted following low BP on metoprolol .  He was placed on my schedule today for a 2-93-month follow-up visit post-CABG.  Presents independently, doing well from a cardiovascular standpoint. He denies chest pain, palpitations, dyspnea, orthopnea, n, v,  dark/tarry/bloody stools, hematuria, dizziness, syncope, edema, weight gain.  States he has been doing well since his medications were changed at his last visit.  No further dizziness or feeling swimmy-headed.  He tries to stay active by continuing the exercises that he performed during cardiac rehab, which he did well with.  States he is retired, had many jobs in the past.  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  Does not routinely check his blood pressure at home.  States has been taking 1/2 tablet of his lisinopril -HCTZ daily.  ROS:   Please see the history of present illness.    All other systems reviewed and are negative.     Past Medical History:  Diagnosis Date   CABG x 3 on 04/11/2024:  LIMA to LAD, SVG to OM,  SVG to PDA. 04/11/2024   Coronary artery disease    Depression    Hypercholesteremia    Hypertension    HZ (herpes zoster) 09/02/2011   Neuritis    left foot  Polycythemia rubra vera (HCC) 09/02/2011    Past Surgical History:  Procedure Laterality Date   CARDIAC CATHETERIZATION     CORONARY ARTERY BYPASS GRAFT N/A 04/11/2024   Procedure: CORONARY ARTERY BYPASS GRAFTING (CABG) X THREE, USING LEFT INTERNAL MAMMARY ARTERY AND RIGHT LEG GREATER SAPHENOUS VEIN HARVESTED ENDOSCOPICALLY;  Surgeon: Shyrl Linnie KIDD, MD;  Location: [facility] OR;  Service: Open Heart Surgery;  Laterality: N/A;    CORONARY/GRAFT ACUTE MI REVASCULARIZATION N/A 04/10/2024   Procedure: Coronary/Graft Acute MI Revascularization;  Surgeon: Ladona Heinz, MD;  Location: [facility] INVASIVE CV LAB;  Service: Cardiovascular;  Laterality: N/A;   GAS INSERTION  06/04/2012   Procedure: INSERTION OF GAS;  Surgeon: Norleen JONETTA Ku, MD;  Location: [facility] OR;  Service: Ophthalmology;  Laterality: Left;  [id number]   INTRAOPERATIVE TRANSESOPHAGEAL ECHOCARDIOGRAM  04/11/2024   Procedure: ECHOCARDIOGRAM, TRANSESOPHAGEAL, INTRAOPERATIVE;  Surgeon: Shyrl Linnie KIDD, MD;  Location: [facility] OR;  Service: Open Heart Surgery;;   LEFT HEART CATH AND CORONARY ANGIOGRAPHY N/A 04/10/2024   Procedure: LEFT HEART CATH AND CORONARY ANGIOGRAPHY;  Surgeon: Ladona Heinz, MD;  Location: [facility] INVASIVE CV LAB;  Service: Cardiovascular;  Laterality: N/A;   SCLERAL BUCKLE  06/04/2012   Procedure: SCLERAL BUCKLE;  Surgeon: Norleen JONETTA Ku, MD;  Location: [facility] OR;  Service: Ophthalmology;  Laterality: Left;  Headscope laser    Current Medications: Active Medications[1]   Allergies:   Hctz [hydrochlorothiazide ] and Tramadol   Social History   Socioeconomic History   Marital status: Single    Spouse name: Not on file   Number of children: Not on file   Years of education: 13   Highest education level: Some college, no degree  Occupational History    Comment: retired   Occupation: Retired  Tobacco Use   Smoking status: Never   Smokeless tobacco: Never   Tobacco comments:    never used tobacco  Vaping Use   Vaping status: Never Used  Substance and Sexual Activity   Alcohol use: No    Alcohol/week: 0.0 standard drinks of alcohol   Drug use: No   Sexual activity: Not Currently  Other Topics Concern   Not on file  Social History Narrative   Not on file   Social Drivers of Health   Tobacco Use: Low Risk (10/06/2024)   Patient History    Smoking Tobacco Use: Never    Smokeless Tobacco Use: Never    Passive Exposure: Not on file  Financial Resource  Strain: Not on file  Food Insecurity: No Food Insecurity (04/10/2024)   Epic    Worried About Programme Researcher, Broadcasting/film/video in the Last Year: Never true    Ran Out of Food in the Last Year: Never true  Transportation Needs: No Transportation Needs (04/10/2024)   Epic    Lack of Transportation (Medical): No    Lack of Transportation (Non-Medical): No  Physical Activity: Not on file  Stress: Not on file  Social Connections: Socially Isolated (04/11/2024)   Social Connection and Isolation Panel    Frequency of Communication with Friends and Family: More than three times a week    Frequency of Social Gatherings with Friends and Family: More than three times a week    Attends Religious Services: Never    Database Administrator or Organizations: No    Attends Banker Meetings: Never    Marital Status: Never married  Depression (PHQ2-9): Low Risk (07/07/2024)   Depression (PHQ2-9)    PHQ-2 Score: 2  Recent Concern: Depression (  PHQ2-9) - Medium Risk (06/29/2024)   Depression (PHQ2-9)    PHQ-2 Score: 7  Alcohol Screen: Not on file  Housing: Low Risk (04/10/2024)   Epic    Unable to Pay for Housing in the Last Year: No    Number of Times Moved in the Last Year: 0    Homeless in the Last Year: No  Utilities: Not At Risk (04/10/2024)   Epic    Threatened with loss of utilities: No  Health Literacy: Not on file     Family History: The patient's family history includes Heart attack in his mother.  EKGs/Labs/Other Studies Reviewed:    The following studies were reviewed today:       Recent Labs: 04/10/2024: TSH 1.335 04/12/2024: Magnesium  2.2 07/07/2024: ALT 13; BUN 11; Creatinine 0.66; Hemoglobin 13.1; Platelet Count 319; Potassium 3.9; Sodium 141  Recent Lipid Panel    Component Value Date/Time   CHOL 129 04/10/2024 1259   TRIG 51 04/10/2024 1259   HDL 36 (L) 04/10/2024 1259   CHOLHDL 3.6 04/10/2024 1259   VLDL 10 04/10/2024 1259   LDLCALC 83 04/10/2024 1259     Risk  Assessment/Calculations:                Physical Exam:    VS:  BP 120/62   Pulse 92   Ht 5' 5 (1.651 m)   Wt 133 lb (60.3 kg)   SpO2 97%   BMI 22.13 kg/m        Wt Readings from Last 3 Encounters:  10/06/24 133 lb (60.3 kg)  09/22/24 133 lb 9.6 oz (60.6 kg)  08/18/24 134 lb 12.8 oz (61.1 kg)     GEN:  Well nourished, well developed in no acute distress HEENT: Normal NECK:  No carotid bruits CARDIAC:  S1-S2 normal, RRR, no murmurs, rubs, gallops RESPIRATORY:  Clear to auscultation without rales, wheezing or rhonchi  MUSCULOSKELETAL:  No edema; No deformity  SKIN: Warm and dry NEUROLOGIC:  Alert and oriented x 3 PSYCHIATRIC:  Normal affect       Assessment & Plan Coronary artery disease involving native coronary artery of native heart without angina pectoris S/P CABG x 3 Doing well following his surgery, finished ICR, continues to do exercises he learned there at home with no cardiovascular symptoms during exercise Continue to monitor for new symptoms and update the office if any occur ED precautions provided Has been taking his Plavix , denies missing any doses, denies significant bleeding/bruising Not a candidate for metoprolol  due to previous symptomatic hypotension Continue Plavix  75 mg daily Continue simvastatin  20 mg daily Hypercholesteremia LDL goal <70 per Ganji's last note Lipid panel was ordered at recent visit, not performed Patient ate breakfast this morning prior to arrival We will have patient return for fasting lipids and LFTs and adjust medication as needed Patient states he is on Simvastatin . Could consider increasing this to 40 mg daily if not at goal Continue simvastatin  20 mg daily Primary hypertension Does not routinely monitor BP at home Postural hypotension resolved following metoprolol  cessation at prior visit.  No longer taking midodrine  prn Patient states he's been taking half a tablet of his lisinopril  hydrochlorothiazide  daily  instead of the whole tablet Continue lisinopril  HCTZ 20-12.5, half-tablet daily as you have been taking it considering BP and symptoms are stable Labs 2 months prior stable, will be due for repeat at follow-up visit  Disposition: Follow-up with Dr. Ladona in 6 months or sooner if needed.  Proceed to the ED  with any new or worsening symptoms.            Medication Adjustments/Labs and Tests Ordered: Current medicines are reviewed at length with the patient today.  Concerns regarding medicines are outlined above.  Orders Placed This Encounter  Procedures   Lipid Profile   Hepatic function panel   No orders of the defined types were placed in this encounter.   Patient Instructions  Medication Instructions:  Your physician recommends that you continue on your current medications as directed. Please refer to the Current Medication list given to you today.  *If you need a refill on your cardiac medications before your next appointment, please call your pharmacy*  Lab Work: At your earliest convenience- Fasting Lipids, LFTs If you have labs (blood work) drawn today and your tests are completely normal, you will receive your results only by: MyChart Message (if you have MyChart) OR A paper copy in the mail If you have any lab test that is abnormal or we need to change your treatment, we will call you to review the results.  Follow-Up: At [facility], you and your health needs are our priority.  As part of our continuing mission to provide you with exceptional heart care, our providers are all part of one team.  This team includes your primary Cardiologist (physician) and Advanced Practice Providers or APPs (Physician Assistants and Nurse Practitioners) who all work together to provide you with the care you need, when you need it.  Your next appointment:   6 month(s)  Provider:   Gordy Bergamo, MD                Signed, Johnasia Liese E Neil Errickson, NP  10/06/2024 12:06 PM     [facility]     [1]  Current Meds  Medication Sig   ALPRAZolam  (XANAX ) 1 MG tablet Take 0.5 tablets (0.5 mg total) by mouth at bedtime.   Cholecalciferol  50 MCG (2000 UT) CAPS Take 2,000 Units by mouth daily.   clopidogrel  (PLAVIX ) 75 MG tablet Take 1 tablet (75 mg total) by mouth daily.   fish oil-omega-3 fatty acids 1000 MG capsule Take 1 g by mouth every other day.   gabapentin  (NEURONTIN ) 100 MG capsule TAKE 2 CAPSULES BY MOUTH 3 TIMES DAILY.   lisinopril -hydrochlorothiazide  (ZESTORETIC ) 20-12.5 MG tablet Take 0.5 tablets by mouth daily.   sertraline (ZOLOFT) 25 MG tablet 1 tablet Orally Once a day; Duration: 30 days   simvastatin  (ZOCOR ) 20 MG tablet Take 20 mg by mouth at bedtime.   tamsulosin  (FLOMAX ) 0.4 MG CAPS capsule Take 0.4 mg by mouth daily.   vitamin B-12 (CYANOCOBALAMIN ) 100 MCG tablet Take 100 mcg by mouth daily.   [DISCONTINUED] midodrine  (PROAMATINE ) 10 MG tablet Take 1 tablet (10 mg total) by mouth 3 (three) times daily as needed (For low BP and dizziness).   "

## 2024-10-06 ENCOUNTER — Ambulatory Visit: Attending: Cardiology | Admitting: Emergency Medicine

## 2024-10-06 ENCOUNTER — Encounter: Payer: Self-pay | Admitting: Emergency Medicine

## 2024-10-06 VITALS — BP 120/62 | HR 92 | Ht 65.0 in | Wt 133.0 lb

## 2024-10-06 DIAGNOSIS — I10 Essential (primary) hypertension: Secondary | ICD-10-CM | POA: Diagnosis not present

## 2024-10-06 DIAGNOSIS — Z951 Presence of aortocoronary bypass graft: Secondary | ICD-10-CM | POA: Diagnosis not present

## 2024-10-06 DIAGNOSIS — I2510 Atherosclerotic heart disease of native coronary artery without angina pectoris: Secondary | ICD-10-CM

## 2024-10-06 DIAGNOSIS — E7800 Pure hypercholesterolemia, unspecified: Secondary | ICD-10-CM

## 2024-10-06 NOTE — Patient Instructions (Signed)
 Medication Instructions:  Your physician recommends that you continue on your current medications as directed. Please refer to the Current Medication list given to you today.  *If you need a refill on your cardiac medications before your next appointment, please call your pharmacy*  Lab Work: At your earliest convenience- Fasting Lipids, LFTs If you have labs (blood work) drawn today and your tests are completely normal, you will receive your results only by: MyChart Message (if you have MyChart) OR A paper copy in the mail If you have any lab test that is abnormal or we need to change your treatment, we will call you to review the results.  Follow-Up: At [facility], you and your health needs are our priority.  As part of our continuing mission to provide you with exceptional heart care, our providers are all part of one team.  This team includes your primary Cardiologist (physician) and Advanced Practice Providers or APPs (Physician Assistants and Nurse Practitioners) who all work together to provide you with the care you need, when you need it.  Your next appointment:   6 month(s)  Provider:   Gordy Bergamo, MD

## 2024-10-06 NOTE — Assessment & Plan Note (Addendum)
 Doing well following his surgery, finished ICR, continues to do exercises he learned there at home with no cardiovascular symptoms during exercise Continue to monitor for new symptoms and update the office if any occur ED precautions provided Has been taking his Plavix , denies missing any doses, denies significant bleeding/bruising Not a candidate for metoprolol  due to previous symptomatic hypotension Continue Plavix  75 mg daily Continue simvastatin  20 mg daily

## 2024-10-13 LAB — LIPID PANEL

## 2024-10-14 ENCOUNTER — Ambulatory Visit: Payer: Self-pay | Admitting: Emergency Medicine

## 2024-10-14 LAB — LIPID PANEL
Cholesterol, Total: 111 mg/dL (ref 100–199)
HDL: 54 mg/dL
LDL CALC COMMENT:: 2.1 ratio (ref 0.0–5.0)
LDL Chol Calc (NIH): 40 mg/dL (ref 0–99)
Triglycerides: 90 mg/dL (ref 0–149)
VLDL Cholesterol Cal: 17 mg/dL (ref 5–40)

## 2024-10-14 LAB — HEPATIC FUNCTION PANEL
ALT: 19 IU/L (ref 0–44)
AST: 19 IU/L (ref 0–40)
Albumin: 4 g/dL (ref 3.7–4.7)
Alkaline Phosphatase: 91 IU/L (ref 48–129)
Bilirubin Total: 1.1 mg/dL (ref 0.0–1.2)
Bilirubin, Direct: 0.37 mg/dL (ref 0.00–0.40)
Total Protein: 6.6 g/dL (ref 6.0–8.5)

## 2024-10-27 ENCOUNTER — Encounter: Payer: Self-pay | Admitting: Medical Oncology

## 2024-10-27 ENCOUNTER — Inpatient Hospital Stay

## 2024-10-27 ENCOUNTER — Inpatient Hospital Stay: Admitting: Medical Oncology

## 2024-10-27 ENCOUNTER — Other Ambulatory Visit: Payer: Self-pay

## 2024-10-27 VITALS — BP 112/59 | HR 82 | Resp 18

## 2024-10-27 VITALS — BP 100/68 | HR 95 | Temp 97.7°F | Resp 18 | Ht 65.5 in | Wt 132.0 lb

## 2024-10-27 DIAGNOSIS — D500 Iron deficiency anemia secondary to blood loss (chronic): Secondary | ICD-10-CM

## 2024-10-27 DIAGNOSIS — D751 Secondary polycythemia: Secondary | ICD-10-CM

## 2024-10-27 DIAGNOSIS — D45 Polycythemia vera: Secondary | ICD-10-CM

## 2024-10-27 LAB — CMP (CANCER CENTER ONLY)
ALT: 22 U/L (ref 0–44)
AST: 29 U/L (ref 15–41)
Albumin: 4.2 g/dL (ref 3.5–5.0)
Alkaline Phosphatase: 89 U/L (ref 38–126)
Anion gap: 12 (ref 5–15)
BUN: 22 mg/dL (ref 8–23)
CO2: 25 mmol/L (ref 22–32)
Calcium: 10.2 mg/dL (ref 8.9–10.3)
Chloride: 101 mmol/L (ref 98–111)
Creatinine: 0.85 mg/dL (ref 0.61–1.24)
GFR, Estimated: >60 mL/min
Glucose, Bld: 99 mg/dL (ref 70–99)
Potassium: 3.8 mmol/L (ref 3.5–5.1)
Sodium: 138 mmol/L (ref 135–145)
Total Bilirubin: 1.4 mg/dL — ABNORMAL HIGH (ref 0.0–1.2)
Total Protein: 7.1 g/dL (ref 6.5–8.1)

## 2024-10-27 LAB — IRON AND IRON BINDING CAPACITY (CC-WL,HP ONLY)
Iron: 113 ug/dL (ref 45–182)
Saturation Ratios: 28 % (ref 17.9–39.5)
TIBC: 402 ug/dL (ref 250–450)
UIBC: 289 ug/dL

## 2024-10-27 LAB — CBC WITH DIFFERENTIAL (CANCER CENTER ONLY)
Abs Immature Granulocytes: 0.13 10*3/uL — ABNORMAL HIGH (ref 0.00–0.07)
Basophils Absolute: 0 10*3/uL (ref 0.0–0.1)
Basophils Relative: 0 %
Eosinophils Absolute: 0.1 10*3/uL (ref 0.0–0.5)
Eosinophils Relative: 1 %
HCT: 46 % (ref 39.0–52.0)
Hemoglobin: 15.1 g/dL (ref 13.0–17.0)
Immature Granulocytes: 1 %
Lymphocytes Relative: 19 %
Lymphs Abs: 1.8 10*3/uL (ref 0.7–4.0)
MCH: 29.7 pg (ref 26.0–34.0)
MCHC: 32.8 g/dL (ref 30.0–36.0)
MCV: 90.4 fL (ref 80.0–100.0)
Monocytes Absolute: 1.2 10*3/uL — ABNORMAL HIGH (ref 0.1–1.0)
Monocytes Relative: 13 %
Neutro Abs: 6.2 10*3/uL (ref 1.7–7.7)
Neutrophils Relative %: 66 %
Platelet Count: 270 10*3/uL (ref 150–400)
RBC: 5.09 MIL/uL (ref 4.22–5.81)
RDW: 16.3 % — ABNORMAL HIGH (ref 11.5–15.5)
WBC Count: 9.4 10*3/uL (ref 4.0–10.5)
nRBC: 0 % (ref 0.0–0.2)

## 2024-10-27 LAB — FERRITIN: Ferritin: 29 ng/mL (ref 24–336)

## 2024-10-27 MED ORDER — SODIUM CHLORIDE 0.9 % IV SOLN: Freq: Once | INTRAVENOUS | Status: AC

## 2024-10-27 NOTE — Progress Notes (Signed)
 " Hematology and Oncology Follow Up Visit  Jesse [name] [id number] 02-24-1941 84 y.o. 10/27/2024   Principle Diagnosis:  Polycythemia - JAK2 negative   Current Therapy:        Phlebotomy to maintain hematocrit below 45% Aspirin  81 mg by mouth daily   Interim History:  Jesse [name] is here today for follow-up and consideration of a phlebotomy.  Today he states that he is doing ok overall.   He continues to have chronic back pain which is stable. He is going on the 14 of this month for a back injection. He reports having done well with these in the past.   He has had no issues with nausea or vomiting.  He has had no change in bowel or bladder habits.  He has had no bleeding.  Has had no rashes.  Weight and appetite are stable Wt Readings from Last 3 Encounters:  10/27/24 132 lb (59.9 kg)  10/06/24 133 lb (60.3 kg)  09/22/24 133 lb 9.6 oz (60.6 kg)   Overall, I would say his performance status is probably ECOG 1.   Medications:  Allergies as of 10/27/2024       Reactions   Hctz [hydrochlorothiazide ] Other (See Comments)   Hyponatremia ; Na+ 111, w weakness resulting in mechanical fall   Tramadol Nausea And Vomiting        Medication List        Accurate as of October 27, 2024 11:08 AM. If you have any questions, ask your nurse or doctor.          ALPRAZolam  1 MG tablet Commonly known as: XANAX  Take 0.5 tablets (0.5 mg total) by mouth at bedtime.   Cholecalciferol  50 MCG (2000 UT) Caps Take 2,000 Units by mouth daily.   clopidogrel  75 MG tablet Commonly known as: PLAVIX  Take 1 tablet (75 mg total) by mouth daily.   fish oil-omega-3 fatty acids 1000 MG capsule Take 1 g by mouth every other day.   gabapentin  100 MG capsule Commonly known as: NEURONTIN  TAKE 2 CAPSULES BY MOUTH 3 TIMES DAILY.   lisinopril -hydrochlorothiazide  20-12.5 MG tablet Commonly known as: ZESTORETIC  Take 0.5 tablets by mouth daily.   sertraline 25 MG tablet Commonly known as:  ZOLOFT 1 tablet Orally Once a day; Duration: 30 days   simvastatin  20 MG tablet Commonly known as: ZOCOR  Take 20 mg by mouth at bedtime.   tamsulosin  0.4 MG Caps capsule Commonly known as: FLOMAX  Take 0.4 mg by mouth daily.   vitamin B-12 100 MCG tablet Commonly known as: CYANOCOBALAMIN  Take 100 mcg by mouth daily.        Allergies:  Allergies  Allergen Reactions   Hctz [Hydrochlorothiazide ] Other (See Comments)    Hyponatremia ; Na+ 111, w weakness resulting in mechanical fall   Tramadol Nausea And Vomiting    Past Medical History, Surgical history, Social history, and Family History were reviewed and updated.  Review of Systems: Review of Systems  Constitutional: Negative.   HENT: Negative.    Eyes: Negative.   Respiratory: Negative.    Cardiovascular: Negative.   Gastrointestinal: Negative.   Genitourinary: Negative.   Musculoskeletal:  Positive for back pain.  Skin: Negative.   Neurological:  Positive for sensory change.  Endo/Heme/Allergies: Negative.   Psychiatric/Behavioral: Negative.       Physical Exam:  height is 5' 5.5 (1.664 m) and weight is 132 lb (59.9 kg). His oral temperature is 97.7 F (36.5 C). His blood pressure is 100/68 and his pulse  is 95. His respiration is 18 and oxygen saturation is 98%.   Wt Readings from Last 3 Encounters:  10/27/24 132 lb (59.9 kg)  10/06/24 133 lb (60.3 kg)  09/22/24 133 lb 9.6 oz (60.6 kg)    Physical Exam Vitals reviewed.  HENT:     Head: Normocephalic and atraumatic.  Eyes:     Pupils: Pupils are equal, round, and reactive to light.  Cardiovascular:     Rate and Rhythm: Normal rate and regular rhythm.     Heart sounds: Normal heart sounds.  Pulmonary:     Effort: Pulmonary effort is normal.     Breath sounds: Normal breath sounds.  Abdominal:     General: Bowel sounds are normal.     Palpations: Abdomen is soft.  Musculoskeletal:        General: No tenderness or deformity. Normal range of motion.      Cervical back: Normal range of motion.  Lymphadenopathy:     Cervical: No cervical adenopathy.  Skin:    General: Skin is warm and dry.     Findings: No erythema or rash.  Neurological:     Mental Status: He is alert and oriented to person, place, and time.  Psychiatric:        Behavior: Behavior normal.        Thought Content: Thought content normal.        Judgment: Judgment normal.      Lab Results  Component Value Date   WBC 9.4 10/27/2024   HGB 15.1 10/27/2024   HCT 46.0 10/27/2024   MCV 90.4 10/27/2024   PLT 270 10/27/2024   Lab Results  Component Value Date   FERRITIN 34 07/07/2024   IRON 38 (L) 07/07/2024   TIBC 392 07/07/2024   UIBC 354 07/07/2024   IRONPCTSAT 10 (L) 07/07/2024   Lab Results  Component Value Date   RETICCTPCT 1.6 12/11/2021   RBC 5.09 10/27/2024   RETICCTABS 87.5 01/02/2011   No results found for: KPAFRELGTCHN, LAMBDASER, KAPLAMBRATIO No results found for: KIMBERLY LE, IGMSERUM No results found for: STEPHANY CARLOTA BENSON MARKEL EARLA JOANNIE DOC VICK, SPEI   Chemistry      Component Value Date/Time   NA 141 07/07/2024 1026   NA 136 08/19/2017 1149   NA 135 (L) 10/21/2016 1151   K 3.9 07/07/2024 1026   K 3.8 08/19/2017 1149   K 3.9 10/21/2016 1151   CL 105 07/07/2024 1026   CL 95 (L) 08/19/2017 1149   CO2 25 07/07/2024 1026   CO2 28 08/19/2017 1149   CO2 25 10/21/2016 1151   BUN 11 07/07/2024 1026   BUN 14 08/19/2017 1149   BUN 13.6 10/21/2016 1151   CREATININE 0.66 07/07/2024 1026   CREATININE 1.0 08/19/2017 1149   CREATININE 0.8 10/21/2016 1151      Component Value Date/Time   CALCIUM  10.2 07/07/2024 1026   CALCIUM  10.1 08/19/2017 1149   CALCIUM  10.3 10/21/2016 1151   ALKPHOS 91 10/13/2024 0915   ALKPHOS 68 08/19/2017 1149   ALKPHOS 68 10/21/2016 1151   AST 19 10/13/2024 0915   AST 25 07/07/2024 1026   AST 25 10/21/2016 1151   ALT 19 10/13/2024 0915   ALT 13  07/07/2024 1026   ALT 32 08/19/2017 1149   ALT 38 10/21/2016 1151   BILITOT 1.1 10/13/2024 0915   BILITOT 1.0 07/07/2024 1026   BILITOT 1.22 (H) 10/21/2016 1151     Encounter Diagnoses  Name Primary?   Polycythemia  rubra vera (HCC) Yes   Iron deficiency anemia due to chronic blood loss    Polycythemia, secondary     Impression and Plan: Jesse [name] is a very pleasant 84 yo caucasian gentleman with polycythemia, JAK-2 negative.   His CBC today shows a Hgb of 15.1 and a HCT of 46- phlebotomy indicated  He will continue to follow up with cardiology.   RTC 3 months APP, labs (CBC, CMP, ferritin, iron), Phlebotomy    Lauraine CHRISTELLA Dais, PA-C 2/4/202611:08 AM  "

## 2024-10-27 NOTE — Progress Notes (Signed)
 Jesse [name] presents today for phlebotomy per MD orders. Phlebotomy procedure started at 1110 and ended at 1125. 508 grams removed. Patient observed for 30 minutes after procedure without any incident. Patient tolerated procedure well. IV needle removed intact.

## 2024-10-27 NOTE — Patient Instructions (Signed)
 Planned Blood Draining (Therapeutic Bleeding): What to Know After After the planned draining of blood from the body, also called therapeutic bleeding, it's common to have: Light-headedness or dizziness. You may feel faint. A feeling like you may throw up. Tiredness. Follow these instructions at home: Eating and drinking Be sure to eat healthy meals for the next 24 hours. Drink more fluids as told. Don't drink alcohol the same day you had therapeutic bleeding. Activity Ask what things are safe for you to do at home. Ask when you can go back to work or school. Avoid doing activities that take a lot of effort the same day as the therapeutic bleeding. Athletes should avoid exercise that takes a lot of effort for as long as told. Change positions slowly for the rest of the day. Move from sitting to standing slowly. This can help prevent feeling light-headed or fainting. If you feel light-headed, lie down until the feeling goes away. IV site care  Keep your bandage dry. You can remove the bandage as told by your health care provider. If you have bleeding where the needle went in, also called the IV site: Raise your arm. Press firmly on the site until the bleeding stops. If you have bruising at the IV site, put ice on the area as told. Place a towel between your skin and the ice. Leave the ice on for 20 minutes, 2-3 times a day, for the first 24 hours. The risk of damage is higher if you can't feel pain, heat, or cold. If the swelling doesn't go away after 24 hours, put a warm, moist cloth on the area for 20 minutes, 2-3 times a day. Check the area around your IV site every day for signs of infection. Check for: Redness, swelling, or pain. Fluid or blood. Warmth. Pus or a bad smell.  General instructions Do not smoke, vape, or use nicotine or tobacco. Keep all follow-up visits. You may need to have blood tests and therapeutic bleeding treatments often. Contact a health care provider  if: You have signs of infection at the IV site. You have a fever or chills. You keep feeling: Light-headed or dizzy. Weak and tired. Like you may throw up. You throw up. Get help right away if: You have chest pain. You have trouble breathing. This information is not intended to replace advice given to you by your health care provider. Make sure you discuss any questions you have with your health care provider. Document Revised: 01/23/2024 Document Reviewed: 01/23/2024 Elsevier Patient Education  2025 [employer].

## 2024-10-28 ENCOUNTER — Ambulatory Visit: Payer: Self-pay | Admitting: Medical Oncology

## 2024-11-08 ENCOUNTER — Ambulatory Visit: Admitting: Orthopedic Surgery

## 2025-01-26 ENCOUNTER — Inpatient Hospital Stay: Admitting: Medical Oncology

## 2025-01-26 ENCOUNTER — Inpatient Hospital Stay
